# Patient Record
Sex: Female | Born: 1935 | Race: Black or African American | Hispanic: No | Marital: Married | Smoking: Former smoker
Health system: Southern US, Community
[De-identification: ages and names within clinical notes are randomized; demographics above are authoritative.]

## PROBLEM LIST (undated history)

## (undated) DIAGNOSIS — I10 Essential (primary) hypertension: Secondary | ICD-10-CM

## (undated) DIAGNOSIS — C8584 Other specified types of non-Hodgkin lymphoma, lymph nodes of axilla and upper limb: Secondary | ICD-10-CM

## (undated) DIAGNOSIS — K562 Volvulus: Secondary | ICD-10-CM

## (undated) DIAGNOSIS — C801 Malignant (primary) neoplasm, unspecified: Secondary | ICD-10-CM

## (undated) DIAGNOSIS — K219 Gastro-esophageal reflux disease without esophagitis: Secondary | ICD-10-CM

## (undated) DIAGNOSIS — Z8639 Personal history of other endocrine, nutritional and metabolic disease: Secondary | ICD-10-CM

## (undated) DIAGNOSIS — I639 Cerebral infarction, unspecified: Secondary | ICD-10-CM

## (undated) DIAGNOSIS — F410 Panic disorder [episodic paroxysmal anxiety] without agoraphobia: Secondary | ICD-10-CM

## (undated) DIAGNOSIS — E119 Type 2 diabetes mellitus without complications: Secondary | ICD-10-CM

## (undated) DIAGNOSIS — J189 Pneumonia, unspecified organism: Secondary | ICD-10-CM

## (undated) DIAGNOSIS — C9110 Chronic lymphocytic leukemia of B-cell type not having achieved remission: Secondary | ICD-10-CM

## (undated) DIAGNOSIS — I509 Heart failure, unspecified: Secondary | ICD-10-CM

## (undated) DIAGNOSIS — E785 Hyperlipidemia, unspecified: Secondary | ICD-10-CM

## (undated) DIAGNOSIS — M1990 Unspecified osteoarthritis, unspecified site: Secondary | ICD-10-CM

## (undated) DIAGNOSIS — M51369 Other intervertebral disc degeneration, lumbar region without mention of lumbar back pain or lower extremity pain: Secondary | ICD-10-CM

## (undated) DIAGNOSIS — M5136 Other intervertebral disc degeneration, lumbar region: Secondary | ICD-10-CM

## (undated) DIAGNOSIS — C8304 Small cell B-cell lymphoma, lymph nodes of axilla and upper limb: Secondary | ICD-10-CM

## (undated) HISTORY — PX: CHOLECYSTECTOMY: SHX55

## (undated) HISTORY — DX: Small cell b-cell lymphoma, lymph nodes of axilla and upper limb: C83.04

## (undated) HISTORY — PX: ABDOMINAL HYSTERECTOMY: SHX81

## (undated) HISTORY — DX: Personal history of other endocrine, nutritional and metabolic disease: Z86.39

## (undated) HISTORY — DX: Chronic lymphocytic leukemia of B-cell type not having achieved remission: C91.10

## (undated) HISTORY — DX: Other specified types of non-hodgkin lymphoma, lymph nodes of axilla and upper limb: C85.84

---

## 2007-12-24 ENCOUNTER — Emergency Department (HOSPITAL_COMMUNITY): Admission: EM | Admit: 2007-12-24 | Discharge: 2007-12-24 | Payer: Self-pay | Admitting: Emergency Medicine

## 2009-03-07 ENCOUNTER — Ambulatory Visit: Payer: Self-pay | Admitting: Internal Medicine

## 2009-03-07 ENCOUNTER — Inpatient Hospital Stay (HOSPITAL_COMMUNITY): Admission: EM | Admit: 2009-03-07 | Discharge: 2009-03-10 | Payer: Self-pay | Admitting: Emergency Medicine

## 2009-03-09 ENCOUNTER — Encounter (INDEPENDENT_AMBULATORY_CARE_PROVIDER_SITE_OTHER): Payer: Self-pay | Admitting: Surgery

## 2011-02-10 LAB — GLUCOSE, CAPILLARY
Glucose-Capillary: 104 mg/dL — ABNORMAL HIGH (ref 70–99)
Glucose-Capillary: 105 mg/dL — ABNORMAL HIGH (ref 70–99)
Glucose-Capillary: 106 mg/dL — ABNORMAL HIGH (ref 70–99)
Glucose-Capillary: 107 mg/dL — ABNORMAL HIGH (ref 70–99)
Glucose-Capillary: 109 mg/dL — ABNORMAL HIGH (ref 70–99)
Glucose-Capillary: 112 mg/dL — ABNORMAL HIGH (ref 70–99)
Glucose-Capillary: 52 mg/dL — ABNORMAL LOW (ref 70–99)
Glucose-Capillary: 75 mg/dL (ref 70–99)
Glucose-Capillary: 80 mg/dL (ref 70–99)
Glucose-Capillary: 87 mg/dL (ref 70–99)
Glucose-Capillary: 87 mg/dL (ref 70–99)
Glucose-Capillary: 87 mg/dL (ref 70–99)
Glucose-Capillary: 95 mg/dL (ref 70–99)
Glucose-Capillary: 95 mg/dL (ref 70–99)
Glucose-Capillary: 96 mg/dL (ref 70–99)
Glucose-Capillary: 98 mg/dL (ref 70–99)

## 2011-02-10 LAB — CBC
HCT: 36.2 % (ref 36.0–46.0)
Hemoglobin: 12.3 g/dL (ref 12.0–15.0)
MCHC: 34 g/dL (ref 30.0–36.0)
MCV: 85.2 fL (ref 78.0–100.0)
Platelets: 186 10*3/uL (ref 150–400)
Platelets: 192 10*3/uL (ref 150–400)
RBC: 4.24 MIL/uL (ref 3.87–5.11)
RDW: 13.8 % (ref 11.5–15.5)
WBC: 6.3 10*3/uL (ref 4.0–10.5)
WBC: 8.7 10*3/uL (ref 4.0–10.5)

## 2011-02-10 LAB — COMPREHENSIVE METABOLIC PANEL
ALT: 45 U/L — ABNORMAL HIGH (ref 0–35)
AST: 115 U/L — ABNORMAL HIGH (ref 0–37)
AST: 60 U/L — ABNORMAL HIGH (ref 0–37)
Albumin: 3.7 g/dL (ref 3.5–5.2)
Albumin: 3.9 g/dL (ref 3.5–5.2)
Alkaline Phosphatase: 83 U/L (ref 39–117)
Alkaline Phosphatase: 94 U/L (ref 39–117)
Alkaline Phosphatase: 98 U/L (ref 39–117)
BUN: 11 mg/dL (ref 6–23)
BUN: 7 mg/dL (ref 6–23)
CO2: 23 mEq/L (ref 19–32)
CO2: 25 mEq/L (ref 19–32)
Calcium: 10.2 mg/dL (ref 8.4–10.5)
Chloride: 105 mEq/L (ref 96–112)
Chloride: 109 mEq/L (ref 96–112)
Creatinine, Ser: 1.44 mg/dL — ABNORMAL HIGH (ref 0.4–1.2)
GFR calc Af Amer: 43 mL/min — ABNORMAL LOW (ref >60)
GFR calc Af Amer: 54 mL/min — ABNORMAL LOW (ref >60)
GFR calc non Af Amer: 31 mL/min — ABNORMAL LOW (ref >60)
GFR calc non Af Amer: 35 mL/min — ABNORMAL LOW (ref >60)
Glucose, Bld: 118 mg/dL — ABNORMAL HIGH (ref 70–99)
Glucose, Bld: 98 mg/dL (ref 70–99)
Potassium: 3 mEq/L — ABNORMAL LOW (ref 3.5–5.1)
Potassium: 4.3 mEq/L (ref 3.5–5.1)
Potassium: 4.9 mEq/L (ref 3.5–5.1)
Sodium: 137 mEq/L (ref 135–145)
Sodium: 141 mEq/L (ref 135–145)
Total Bilirubin: 1.2 mg/dL (ref 0.3–1.2)
Total Bilirubin: 1.7 mg/dL — ABNORMAL HIGH (ref 0.3–1.2)
Total Bilirubin: 2.1 mg/dL — ABNORMAL HIGH (ref 0.3–1.2)
Total Protein: 6 g/dL (ref 6.0–8.3)
Total Protein: 7.8 g/dL (ref 6.0–8.3)

## 2011-02-10 LAB — BASIC METABOLIC PANEL
BUN: 8 mg/dL (ref 6–23)
CO2: 21 mEq/L (ref 19–32)
Calcium: 9.8 mg/dL (ref 8.4–10.5)
Chloride: 107 mEq/L (ref 96–112)
Creatinine, Ser: 1.36 mg/dL — ABNORMAL HIGH (ref 0.4–1.2)
GFR calc Af Amer: 46 mL/min — ABNORMAL LOW (ref >60)
GFR calc non Af Amer: 38 mL/min — ABNORMAL LOW (ref >60)
Glucose, Bld: 114 mg/dL — ABNORMAL HIGH (ref 70–99)
Potassium: 4.1 mEq/L (ref 3.5–5.1)
Sodium: 136 mEq/L (ref 135–145)

## 2011-02-10 LAB — URINALYSIS, ROUTINE W REFLEX MICROSCOPIC
Bilirubin Urine: NEGATIVE
Hgb urine dipstick: NEGATIVE
Specific Gravity, Urine: 1.009 (ref 1.005–1.030)
Urobilinogen, UA: 0.2 mg/dL (ref 0.0–1.0)
pH: 8 (ref 5.0–8.0)

## 2011-02-10 LAB — LIPID PANEL
Cholesterol: 154 mg/dL (ref 0–200)
LDL Cholesterol: 111 mg/dL — ABNORMAL HIGH (ref 0–99)
Total CHOL/HDL Ratio: 5.7 RATIO

## 2011-02-10 LAB — URINE CULTURE

## 2011-02-10 LAB — DIFFERENTIAL
Basophils Relative: 0 % (ref 0–1)
Eosinophils Absolute: 0 10*3/uL (ref 0.0–0.7)
Monocytes Absolute: 0.1 10*3/uL (ref 0.1–1.0)
Neutro Abs: 8.1 10*3/uL — ABNORMAL HIGH (ref 1.7–7.7)

## 2011-03-17 NOTE — Consult Note (Signed)
NAMESESILIA, POUCHER                 ACCOUNT NO.:  [id number]   MEDICAL RECORD NO.:  [id number]          PATIENT TYPE:  INP   LOCATION:  5118                         FACILITY:  [facility]   PHYSICIAN:  Pricilla Riffle, MD, FACCDATE OF BIRTH:  11-04-1929   DATE OF CONSULTATION:  03/07/2009  DATE OF DISCHARGE:                                 CONSULTATION   IDENTIFICATION:  The patient is a 77 year old, we were asked to see  regarding preop risk stratification.   HISTORY OF PRESENT ILLNESS:  The patient was admitted on Mar 07, 2009,  with complaints of right upper quadrant pain and a low-grade fever,  temperature of 100.8.  Ultrasound showed gallstones.  A murmur was heard  and Cardiology was called.  Plan for a cholecystectomy.   The patient is not very active because of back pain.  She does limited  house work and mainly in a chair.  Denies chest pain.  Denies PND.   ALLERGIES:  None.   MEDICATIONS:  His medications on admission include,  1. Glipizide 10 b.i.d.  2. Lisinopril 40.  3. Verapamil SR 240.  4. Maxzide 37.5/25.  5. Ecotrin 325.  6. Ativan 1 t.i.d.   PAST MEDICAL HISTORY:  1. Diabetes type 2, x13 years.  The patient reports glucose ranging      from the 70s to 120s.  2. Hypertension x13 years.  3. Anxiety.  4. Degenerative joint disease.  5. History of cerebrovascular accident in 1997.  6. Question, increased lipids, on Lipitor in past.   SOCIAL HISTORY:  The patient quit tobacco in 1997 after smoking many  years, does not drink.   FAMILY HISTORY:  Significant for CAD.   REVIEW OF SYSTEMS:  Slight stroke in 1997, being followed by Dr.  Ronne Binning.  Diabetes as noted.  Sinus drainage.  DJD.  Otherwise all  systems reviewed, negative for all problems except as noted above.   PHYSICAL EXAMINATION:  GENERAL:  This patient is an obese 77 year old,  in no acute distress, lying in bed curled up.  VITAL SIGNS:  Blood pressure is 111-130/50-58, pulses 60-80 sinus  rhythm,  temperature is 98, O2 sat on room air is 94%.  HEENT:  Normocephalic, atraumatic.  EOMI.  PERRL.  Mucous membranes are  dry.  NECK:  JVP is normal.  No audible bruits.  No thyromegaly.  LUNGS:  Relatively clear.  No rales or wheezes.  CARDIAC:  Regular rate and rhythm, S1 and S2.  Grade 1/6 systolic  murmur.  No S3.  ABDOMEN:  Supple, nontender.  Normal bowel sounds.  Obese.  No masses.  EXTREMITIES:  Good distal pulses 2+ posterior tibial with no lower  extremity edema.   LABORATORY DATA:  Labs significant for hemoglobin of 14.9, WBC of 8.7,  potassium of 3, BUN and creatinine of 5 and 1.2, AST of 115, ALT of 68.  Abdominal ultrasound, aorta measures 1.7 cm, cholelithiasis noted.  Chest x-ray, no acute disease.  A 12-lead EKG, normal sinus rhythm 64  beats per minute left axis deviation.   IMPRESSION:  A 77 year old with multiple  cardiac risk factors (diabetes,  hypertension, obesity, reported CVA, tobacco use history, and question  of increased lipids).  She also has a family history of CAD.   PLAN:  1. Plan for cholecystectomy.  Activity is very limited, though because      of DJD, does house work from a chair.  Given all the above would be      an adenosine Myoview to evaluate for ischemia.  2. I am not impressed by significant murmur, but would get      echocardiogram to confirm evaluate LV function.  3. Health care maintenance.  We will check a lipid panel, not clear,      however, she is not on Lipitor.  4. Diabetes per primary team.      Pricilla Riffle, MD, [facility]  Electronically Signed     PVR/MEDQ  D:  03/07/2009  T:  03/08/2009  Job:  [id number]

## 2011-03-17 NOTE — H&P (Signed)
[name], [name]                 ACCOUNT NO.:  [id number]   MEDICAL RECORD NO.:  [id number]          PATIENT TYPE:  INP   LOCATION:  5118                         FACILITY:  [facility]   PHYSICIAN:  Erika [name], M.D.DATE OF BIRTH:  1929/12/31   DATE OF ADMISSION:  03/07/2009  DATE OF DISCHARGE:                              HISTORY & PHYSICAL   CHIEF COMPLAINT:  Right upper quadrant pain, nausea, and vomiting.   HISTORY OF PRESENT ILLNESS:  The patient is a 77 year old female with a  1-day history of right upper quadrant pain, nausea, and vomiting.  The  pain was severe in nature and 8/10.  Located in the right upper quadrant  epigastrium.  It is improved somewhat with pain medicine here in the  emergency room.  It has also been associated with nausea, vomiting, and  weakness.  She has never had pain like this before.  There is no  definite association with eating.  She had a low-grade temperature of  100.8 when she came to the emergency room.  I was just associated by Dr.  Virginia [name] in the emergency room.  Ultrasound showed gallstones without signs  of cholecystitis.   PAST MEDICAL HISTORY:  1. Type 2 diabetes mellitus.  2. Hypertension.  3. Anxiety.  4. Cerebrovascular accident.   PAST SURGICAL HISTORY:  Denies.   ALLERGIES:  None.   MEDICATIONS:  1. Glipizide 10 mg b.i.d.  2. Lisinopril 40 mg once a day.  3. Verapamil SR 240 daily.  4. Maxzide 37.5/25 daily.  5. Ativan 1 mg t.i.d.  6. Ecotrin 325 mg daily.   FAMILY HISTORY:  Noncontributory.   SOCIAL HISTORY:  Denies tobacco or alcohol use.   REVIEW OF SYSTEMS:  As above, otherwise negative x15.   PHYSICAL EXAMINATION:  VITAL SIGNS:  Temperature 100.8, heart rate  anywhere from 95 to 118, and blood pressure 114/49.  GENERAL APPEARANCE:  Pleasant female, in no apparent distress.  HEENT:  No evidence of visible jaundice.  Oropharynx is moist.  NECK:  Supple and nontender.  Full range of motion.  PULMONARY:  Lungs  sounds are clear bilaterally.  Chest wall motion is  normal  CARDIOVASCULAR:  There is a 3/6 systolic ejection murmur, otherwise rate  is regular.  EXTREMITIES:  Warm and well perfused.  ABDOMEN:  Tender in the right upper quadrant.  There was some mild  rebound, guarding noted.  No hernia.  No ascites.  MUSCULOSKELETAL:  Muscle tone appears normal.  Range of motion is  normal.  NEUROLOGIC:  Glasgow Coma Scale is 15.  Motor and sensory function  grossly intact.   DIAGNOSTIC STUDIES:  Ultrasound of the abdomen shows multiple large  gallstones without significant gallbladder wall thickening.  She has a  white count of 8700, hemoglobin of 14, hematocrit of 40, and platelet  count is 186, there is a left shift though.  CMP shows a bilirubin of  2.1, alk phos is 98, and SGOT 115.  Sodium 137, potassium 3, chloride  105, glucose is 115.  Lipase is normal at 49.  Urinalysis is normal.  IMPRESSION:  1. Symptomatic cholecystitis by clinical exam.  2. History of hypertension.  3. Diabetes mellitus type 2.  4. Heart murmur.   PLAN:  She will be admitted for IV antibiotics and placement of her  potassium and will need a cardiac workup to look at the murmur prior to  any surgical intervention.  She is feeling somewhat better given her  diabetes and left shift on her CBC and her physical exam findings and we  feel cholecystectomy would be best if she tolerates that.  We will get  her admitted and ask Cardiology to see her later today.      Erika [name], M.D.  Electronically Signed     TAC/MEDQ  D:  03/07/2009  T:  03/07/2009  Job:  [id number]

## 2011-03-17 NOTE — Op Note (Signed)
Erika [name], Erika [name]                 ACCOUNT NO.:  [id number]   MEDICAL RECORD NO.:  [id number]          PATIENT TYPE:  INP   LOCATION:  5118                         FACILITY:  [facility]   PHYSICIAN:  Wilmon Arms. Corliss Skains, M.D. DATE OF BIRTH:  08/29/1930   DATE OF PROCEDURE:  03/09/2009  DATE OF DISCHARGE:                               OPERATIVE REPORT   PREOPERATIVE DIAGNOSIS:  Acute cholecystitis.   POSTOPERATIVE DIAGNOSIS:  Acute cholecystitis.   PROCEDURE PERFORMED:  Laparoscopic cholecystectomy with intraoperative  cholangiogram.   SURGEON:  Wilmon Arms. Corliss Skains, MD   ASSISTANT:  Anselm Pancoast. Zachery Dakins, M.D..   ANESTHESIA:  General endotracheal.   INDICATIONS:  This is a 77 year old female who presented with a 3-day  history of right upper quadrant pain, nausea, vomiting.  She was worked  up in the emergency department and was evaluated with an ultrasound  showing gallstones, but no wall thickening.  The patient continues to  have symptoms.  She underwent a cardiac evaluation by Cardiology and  this shows that the patient has no significant cardiac disease.  She  presents now for cholecystectomy.   DESCRIPTION OF PROCEDURE:  The patient was brought to the operating  room, placed in the supine position on operating room table.  After an  adequate level of general anesthesia was obtained, the patient's abdomen  was prepped with Betadine and draped in sterile fashion.  A time-out was  taken to assure proper patient, proper procedure.  The area below her  umbilicus was infiltrated with 0.25% Marcaine with epinephrine.  A  transverse incision was made.  Dissection was carried down through  subcutaneous tissues with blunt dissection.  The fascia was grasped with  Kocher clamps and was divided vertically.  The peritoneum was bluntly  entered.  A stay suture of 0 Vicryl was placed around the fascial  opening.  The Hasson cannula was inserted and secured to stay suture.  Pneumoperitoneum was  obtained by insufflating CO2, maintaining maximal  pressure of 15 mmHg.  The laparoscope was inserted and the patient was  positioned in reverse Trendelenburg tilted to her left.  A very large  distended gallbladder was identified.  There were omental adhesions to  the surface of the gallbladder.  An 11-mm port was placed in the  subxiphoid position.  Two 5-mm ports were placed in the right upper  quadrant.  The gallbladder was grasped with the clamp and elevated.  Cautery was used to dissect the omentum away from the surface of the  gallbladder.  The gallbladder was mildly thickened and we retracted it  superiorly.  We were able to dissect all the adhesions away and expose  the hilum of the gallbladder.  The peritoneum was opened and we bluntly  dissected around the cystic duct and cystic artery.  The cystic duct was  ligated and clipped distally.  A small opening was created on a cystic  duct.  A catheter was inserted through a stab incision and threaded into  the cystic duct.  A cholangiogram was obtained, which showed good flow  proximally and distally to biliary  tree with no sign of filling defect  or obstruction.  Contrast flowed readily into the duodenum.  The  catheter was removed and the cystic duct was ligated with clips and  divided.  The cystic artery was ligated with clips and divided.  Cautery  was then used to remove the gallbladder from the liver bed.  The  gallbladder was placed in an EndoCatch sac.  Instruments were removed  from the umbilical port site.  We reinspected the gallbladder fossa and  hemostasis was good.  We irrigated the right upper quadrant.  Pneumoperitoneum was then released as to remove the trocars.  The  pursestring suture  was used to close the fascia.  A 4-0 Monocryl was used to close the skin  incisions.  Steri-Strips and clean dressing was applied.  The patient  was then extubated and brought to recovery room in stable condition.  All sponge,  instrument, needle counts were correct.      Wilmon Arms. Tsuei, M.D.  Electronically Signed     MKT/MEDQ  D:  03/09/2009  T:  03/10/2009  Job:  [id number]

## 2011-03-20 NOTE — Discharge Summary (Signed)
Erika Day, Erika Day                 ACCOUNT NO.:  [id number]   MEDICAL RECORD NO.:  [id number]          PATIENT TYPE:  INP   LOCATION:  5118                         FACILITY:  [facility]   PHYSICIAN:  Anselm Pancoast. Weatherly, M.D.DATE OF BIRTH:  [birth year]   DATE OF ADMISSION:  03/07/2009  DATE OF DISCHARGE:  03/10/2009                               DISCHARGE SUMMARY   DISCHARGING PHYSICIAN:  Anselm Pancoast. Zachery Dakins, M.D.   CHIEF COMPLAINT AND REASON FOR ADMISSION:  Erika Day is a 77 year old  female who presented to the hospital with 1 day of right upper quadrant  abdominal pain associated with nausea and vomiting, severe 8/10, has not  had symptoms like this before.  When she presented to the ER, she was  found have a low-grade temperature of 100.8.  An ultrasound was done  that demonstrated gallstones without evidence of cholecystitis.  On  exam, her abdomen was tender in the right upper quadrant.   The patient was admitted with the following diagnoses:  1. Symptomatic cholelithiasis without evidence of cholecystitis.  2. Transaminitis with a total bilirubin of 2.1, possible      choledocholithiasis.  3. Hypertension.  4. Diabetes.  5. Heart murmur.   HOSPITAL COURSE:  The patient was admitted, placed on n.p.o. status, DVT  prophylaxis was initiated, follow-up labs were obtained.  Due to the  patient's advanced age and multiple risk factors, cardiology consult was  obtained to clear the patient for surgery.  She was evaluated by Dr.  Tenny Craw.  The patient was otherwise deemed appropriate to proceed with  surgical intervention.  Follow-up LFTs did demonstrate that the total  bilirubin was decreasing, so it was felt that the patient could safely  proceed with surgery.  By date of surgery, total bilirubin had actually  decreased down to 1.2.   On Mar 09, 2009, the patient was taken to the OR by Dr. Corliss Skains where she  underwent a laparoscopic cholecystectomy with a normal intraoperative  cholangiogram for acute cholecystitis.  It was discovered she had a  large inflamed distended gallbladder with stones.  She was stable from a  postoperative standpoint, had no issues related to potential cardiac  issues and cardiology signed off.  By postop day #1, the patient was  afebrile, vital signs were stable.  She was satting 92% on room air.  She was tolerating a diet.  There was some concern that she was having  some mild nausea.  It was felt that it might be related to pain  medications, so the medicines were adjusted and the patient's nausea  resolved.  Her abdomen was soft.  Bowel sounds were present and her  dressings were clean, dry, and intact.  When she presented, her  urinalysis was abnormal and subsequent culture revealed greater than  100,000 colonies of diphtheroids which are pansensitive.  She had been  placed on empiric Cipro at date of admission and this had been continued  throughout the hospitalization.  Since she had received 3 days of IV  antibiotics for an uncomplicated community-acquired urinary tract  infection, it was determined she  did not require any additional  antibiotic therapy, and therefore she was otherwise deemed appropriate  for discharge home on postop day #1.   FINAL DISCHARGE DIAGNOSES:  1. Abdominal pain secondary to acute cholecystitis with      cholelithiasis.  2. Diabetes mellitus, well controlled this hospital admission.  3. Hypertension and history of cerebrovascular accident, stable.  4. Transient hypokalemia.   DISCHARGE MEDICATIONS:  The patient will resume the following home  medications:  1. Verapamil SR 240 mg daily.  2. Glipizide 10 mg b.i.d.  3. Lisinopril 40 mg daily.  4. Aspirin 325 mg daily.  5. Lorazepam 1 mg t.i.d.  6. Triamterene/hydrochlorothiazide 37.5/25 daily.   New medication includes Percocet 5/325 one to two tablets every 4 hours  as needed for pain.   ADDITIONAL INSTRUCTIONS:  1. Diet, no restrictions.   Continue carb-modified diet as previous.  2. Remove bandages in the morning of Mar 11, 2009.   ACTIVITY:  Increase activity slowly.  May walk up steps.  May shower.  No lifting greater than 15 pounds for 2 weeks.  No driving for 1 week.   FOLLOW-UP APPOINTMENTS:  At the doc of the week clinic at [facility] on Mar 19, 2009, at 3:30 p.m.   Additional instructions as follows:  Call surgeon if,  A.  Fever greater than or equal to 101.5 degrees Fahrenheit.  B.  New or increased belly pain.  C.  Redness or drainage from wounds.  D.  Nausea, vomiting, diarrhea, or constipation.      Allison L. Rennis Harding, N.P.      Anselm Pancoast. Zachery Dakins, M.D.  Electronically Signed    ALE/MEDQ  D:  04/30/2009  T:  05/01/2009  Job:  [id number]   cc:   Wilmon Arms. Tsuei, M.D.

## 2011-07-27 LAB — POCT CARDIAC MARKERS
Myoglobin, poc: 216
Operator id: 257131
Troponin i, poc: <0.05

## 2011-07-27 LAB — URINE MICROSCOPIC-ADD ON

## 2011-07-27 LAB — I-STAT 8, (EC8 V) (CONVERTED LAB)
BUN: 9
Bicarbonate: 20
Glucose, Bld: 136 — ABNORMAL HIGH
Hemoglobin: 14.3
Sodium: 134 — ABNORMAL LOW
pH, Ven: 7.497 — ABNORMAL HIGH

## 2011-07-27 LAB — URINALYSIS, ROUTINE W REFLEX MICROSCOPIC
Hgb urine dipstick: NEGATIVE
Nitrite: NEGATIVE
Specific Gravity, Urine: 1.013
Urobilinogen, UA: 1
pH: 8

## 2011-07-27 LAB — DIFFERENTIAL
Basophils Absolute: 0
Eosinophils Relative: 0
Lymphocytes Relative: 6 — ABNORMAL LOW
Lymphs Abs: 0.6 — ABNORMAL LOW
Neutro Abs: 9.9 — ABNORMAL HIGH
Neutrophils Relative %: 90 — ABNORMAL HIGH

## 2011-07-27 LAB — CBC
Platelets: 219
RDW: 13.8
WBC: 11 — ABNORMAL HIGH

## 2011-08-17 ENCOUNTER — Inpatient Hospital Stay (HOSPITAL_COMMUNITY)
Admission: EM | Admit: 2011-08-17 | Discharge: 2011-08-21 | DRG: 871 | Disposition: A | Discharge status: Other Acute Inpatient Hospital | Payer: PRIVATE HEALTH INSURANCE | Attending: Pulmonary Disease | Admitting: Pulmonary Disease

## 2011-08-17 ENCOUNTER — Emergency Department (HOSPITAL_COMMUNITY): Payer: PRIVATE HEALTH INSURANCE

## 2011-08-17 DIAGNOSIS — F411 Generalized anxiety disorder: Secondary | ICD-10-CM | POA: Diagnosis present

## 2011-08-17 DIAGNOSIS — N179 Acute kidney failure, unspecified: Secondary | ICD-10-CM | POA: Diagnosis not present

## 2011-08-17 DIAGNOSIS — R109 Unspecified abdominal pain: Secondary | ICD-10-CM | POA: Diagnosis present

## 2011-08-17 DIAGNOSIS — I10 Essential (primary) hypertension: Secondary | ICD-10-CM | POA: Diagnosis present

## 2011-08-17 DIAGNOSIS — Z87891 Personal history of nicotine dependence: Secondary | ICD-10-CM

## 2011-08-17 DIAGNOSIS — G9349 Other encephalopathy: Secondary | ICD-10-CM | POA: Diagnosis present

## 2011-08-17 DIAGNOSIS — Z8673 Personal history of transient ischemic attack (TIA), and cerebral infarction without residual deficits: Secondary | ICD-10-CM

## 2011-08-17 DIAGNOSIS — D72829 Elevated white blood cell count, unspecified: Secondary | ICD-10-CM | POA: Diagnosis present

## 2011-08-17 DIAGNOSIS — E876 Hypokalemia: Secondary | ICD-10-CM | POA: Diagnosis present

## 2011-08-17 DIAGNOSIS — C569 Malignant neoplasm of unspecified ovary: Secondary | ICD-10-CM | POA: Diagnosis present

## 2011-08-17 DIAGNOSIS — R112 Nausea with vomiting, unspecified: Secondary | ICD-10-CM | POA: Diagnosis present

## 2011-08-17 DIAGNOSIS — D6489 Other specified anemias: Secondary | ICD-10-CM | POA: Diagnosis present

## 2011-08-17 DIAGNOSIS — E785 Hyperlipidemia, unspecified: Secondary | ICD-10-CM | POA: Diagnosis present

## 2011-08-17 DIAGNOSIS — M1990 Unspecified osteoarthritis, unspecified site: Secondary | ICD-10-CM | POA: Diagnosis present

## 2011-08-17 DIAGNOSIS — E1169 Type 2 diabetes mellitus with other specified complication: Secondary | ICD-10-CM | POA: Diagnosis present

## 2011-08-17 DIAGNOSIS — A419 Sepsis, unspecified organism: Principal | ICD-10-CM | POA: Diagnosis present

## 2011-08-17 DIAGNOSIS — K5669 Other intestinal obstruction: Secondary | ICD-10-CM | POA: Diagnosis present

## 2011-08-17 LAB — DIFFERENTIAL
Lymphocytes Relative: 20 % (ref 12–46)
Monocytes Absolute: 0.7 10*3/uL (ref 0.1–1.0)
Monocytes Relative: 5 % (ref 3–12)
Neutro Abs: 10.4 10*3/uL — ABNORMAL HIGH (ref 1.7–7.7)

## 2011-08-17 LAB — GLUCOSE, CAPILLARY
Glucose-Capillary: 122 mg/dL — ABNORMAL HIGH (ref 70–99)
Glucose-Capillary: 39 mg/dL — CL (ref 70–99)
Glucose-Capillary: 32 mg/dL — CL (ref 70–99)
Glucose-Capillary: 81 mg/dL (ref 70–99)

## 2011-08-17 LAB — COMPREHENSIVE METABOLIC PANEL
AST: 25 U/L (ref 0–37)
CO2: 24 mEq/L (ref 19–32)
Chloride: 96 mEq/L (ref 96–112)
Creatinine, Ser: 1.08 mg/dL (ref 0.50–1.10)
GFR calc non Af Amer: 48 mL/min — ABNORMAL LOW (ref >90)
Total Bilirubin: 0.4 mg/dL (ref 0.3–1.2)

## 2011-08-17 LAB — CBC
HCT: 33.2 % — ABNORMAL LOW (ref 36.0–46.0)
MCH: 26.7 pg (ref 26.0–34.0)
MCHC: 32.8 g/dL (ref 30.0–36.0)
MCHC: 32.8 g/dL (ref 30.0–36.0)
MCV: 81.2 fL (ref 78.0–100.0)
Platelets: 178 10*3/uL (ref 150–400)
Platelets: 197 10*3/uL (ref 150–400)
RDW: 14.1 % (ref 11.5–15.5)
RDW: 14.1 % (ref 11.5–15.5)
RDW: 14.1 % (ref 11.5–15.5)
WBC: 12.6 10*3/uL — ABNORMAL HIGH (ref 4.0–10.5)
WBC: 14 10*3/uL — ABNORMAL HIGH (ref 4.0–10.5)

## 2011-08-17 LAB — URINALYSIS, ROUTINE W REFLEX MICROSCOPIC
Glucose, UA: NEGATIVE mg/dL
Hgb urine dipstick: NEGATIVE
Ketones, ur: NEGATIVE mg/dL
Leukocytes, UA: NEGATIVE
Protein, ur: NEGATIVE mg/dL

## 2011-08-17 MED ORDER — IOHEXOL 300 MG/ML  SOLN
100.0000 mL | Freq: Once | INTRAMUSCULAR | Status: AC | PRN
  Administered 2011-08-17: 100 mL via INTRAVENOUS

## 2011-08-18 ENCOUNTER — Emergency Department (HOSPITAL_COMMUNITY): Payer: PRIVATE HEALTH INSURANCE

## 2011-08-18 DIAGNOSIS — A419 Sepsis, unspecified organism: Secondary | ICD-10-CM

## 2011-08-18 DIAGNOSIS — N170 Acute kidney failure with tubular necrosis: Secondary | ICD-10-CM

## 2011-08-18 DIAGNOSIS — R6520 Severe sepsis without septic shock: Secondary | ICD-10-CM

## 2011-08-18 DIAGNOSIS — R6521 Severe sepsis with septic shock: Secondary | ICD-10-CM

## 2011-08-18 DIAGNOSIS — K56609 Unspecified intestinal obstruction, unspecified as to partial versus complete obstruction: Secondary | ICD-10-CM

## 2011-08-18 LAB — DIFFERENTIAL
Basophils Absolute: 0 10*3/uL (ref 0.0–0.1)
Basophils Relative: 0 % (ref 0–1)
Lymphocytes Relative: 31 % (ref 12–46)
Lymphocytes Relative: 31 % (ref 12–46)
Monocytes Absolute: 0.9 10*3/uL (ref 0.1–1.0)
Monocytes Absolute: 0.7 10*3/uL (ref 0.1–1.0)
Monocytes Relative: 7 % (ref 3–12)
Monocytes Relative: 6 % (ref 3–12)
Neutro Abs: 7.6 10*3/uL (ref 1.7–7.7)
Neutro Abs: 7.4 10*3/uL (ref 1.7–7.7)
Neutrophils Relative %: 61 % (ref 43–77)
Neutrophils Relative %: 63 % (ref 43–77)

## 2011-08-18 LAB — CBC
HCT: 32 % — ABNORMAL LOW (ref 36.0–46.0)
HCT: 24.6 % — ABNORMAL LOW (ref 36.0–46.0)
HCT: 24.6 % — ABNORMAL LOW (ref 36.0–46.0)
Hemoglobin: 10.3 g/dL — ABNORMAL LOW (ref 12.0–15.0)
Hemoglobin: 8.1 g/dL — ABNORMAL LOW (ref 12.0–15.0)
Hemoglobin: 8 g/dL — ABNORMAL LOW (ref 12.0–15.0)
MCH: 26.5 pg (ref 26.0–34.0)
MCH: 27.2 pg (ref 26.0–34.0)
MCH: 26.8 pg (ref 26.0–34.0)
MCHC: 33.2 g/dL (ref 30.0–36.0)
MCHC: 32.2 g/dL (ref 30.0–36.0)
MCHC: 32.9 g/dL (ref 30.0–36.0)
MCHC: 32.5 g/dL (ref 30.0–36.0)
MCV: 81.6 fL (ref 78.0–100.0)
MCV: 82.3 fL (ref 78.0–100.0)
Platelets: 219 10*3/uL (ref 150–400)
Platelets: 243 10*3/uL (ref 150–400)
RBC: 3.89 MIL/uL (ref 3.87–5.11)
RBC: 2.98 MIL/uL — ABNORMAL LOW (ref 3.87–5.11)
RBC: 2.99 MIL/uL — ABNORMAL LOW (ref 3.87–5.11)
RDW: 14.2 % (ref 11.5–15.5)
RDW: 14.3 % (ref 11.5–15.5)
WBC: 16.8 10*3/uL — ABNORMAL HIGH (ref 4.0–10.5)
WBC: 18 10*3/uL — ABNORMAL HIGH (ref 4.0–10.5)

## 2011-08-18 LAB — PROTIME-INR
INR: 1.47 (ref 0.00–1.49)
Prothrombin Time: 18.1 seconds — ABNORMAL HIGH (ref 11.6–15.2)

## 2011-08-18 LAB — URINE CULTURE: Culture  Setup Time: 201210160204

## 2011-08-18 LAB — GLUCOSE, CAPILLARY
Glucose-Capillary: 32 mg/dL — CL (ref 70–99)
Glucose-Capillary: 83 mg/dL (ref 70–99)
Glucose-Capillary: 91 mg/dL (ref 70–99)
Glucose-Capillary: 98 mg/dL (ref 70–99)

## 2011-08-18 LAB — TROPONIN I: Troponin I: <0.3 ng/mL (ref <0.30)

## 2011-08-18 LAB — CK TOTAL AND CKMB (NOT AT ARMC)
Relative Index: 0.9 (ref 0.0–2.5)
Total CK: 551 U/L — ABNORMAL HIGH (ref 7–177)

## 2011-08-18 LAB — CARDIAC PANEL(CRET KIN+CKTOT+MB+TROPI)
Relative Index: 0.9 (ref 0.0–2.5)
Total CK: 595 U/L — ABNORMAL HIGH (ref 7–177)
Troponin I: <0.3 ng/mL (ref <0.30)

## 2011-08-18 LAB — COMPREHENSIVE METABOLIC PANEL
ALT: 9 U/L (ref 0–35)
AST: 27 U/L (ref 0–37)
Albumin: 2 g/dL — ABNORMAL LOW (ref 3.5–5.2)
Albumin: 2 g/dL — ABNORMAL LOW (ref 3.5–5.2)
BUN: 14 mg/dL (ref 6–23)
CO2: 21 mEq/L (ref 19–32)
Calcium: 7.6 mg/dL — ABNORMAL LOW (ref 8.4–10.5)
Calcium: 7.4 mg/dL — ABNORMAL LOW (ref 8.4–10.5)
Creatinine, Ser: 1.95 mg/dL — ABNORMAL HIGH (ref 0.50–1.10)
GFR calc Af Amer: 31 mL/min — ABNORMAL LOW (ref >90)
GFR calc non Af Amer: 23 mL/min — ABNORMAL LOW (ref >90)
Glucose, Bld: 111 mg/dL — ABNORMAL HIGH (ref 70–99)
Sodium: 129 mEq/L — ABNORMAL LOW (ref 135–145)
Sodium: 131 mEq/L — ABNORMAL LOW (ref 135–145)
Total Protein: 5.3 g/dL — ABNORMAL LOW (ref 6.0–8.3)

## 2011-08-18 LAB — BLOOD GAS, ARTERIAL
Acid-base deficit: 1.9 mmol/L (ref 0.0–2.0)
Acid-base deficit: 2.4 mmol/L — ABNORMAL HIGH (ref 0.0–2.0)
Bicarbonate: 22.6 mEq/L (ref 20.0–24.0)
TCO2: 20.7 mmol/L (ref 0–100)
TCO2: 21.9 mmol/L (ref 0–100)
pCO2 arterial: 38.5 mmHg (ref 35.0–45.0)
pCO2 arterial: 43.4 mmHg (ref 35.0–45.0)
pH, Arterial: 7.337 — ABNORMAL LOW (ref 7.350–7.400)
pO2, Arterial: 76.7 mmHg — ABNORMAL LOW (ref 80.0–100.0)
pO2, Arterial: 27.7 mmHg — CL (ref 80.0–100.0)

## 2011-08-18 LAB — BASIC METABOLIC PANEL
BUN: 13 mg/dL (ref 6–23)
CO2: 21 mEq/L (ref 19–32)
Glucose, Bld: 119 mg/dL — ABNORMAL HIGH (ref 70–99)
Potassium: 3.6 mEq/L (ref 3.5–5.1)
Sodium: 132 mEq/L — ABNORMAL LOW (ref 135–145)

## 2011-08-18 LAB — CK: Total CK: 558 U/L — ABNORMAL HIGH (ref 7–177)

## 2011-08-18 LAB — URINALYSIS, MICROSCOPIC ONLY
Glucose, UA: NEGATIVE mg/dL
Ketones, ur: NEGATIVE mg/dL
Nitrite: NEGATIVE
pH: 6 (ref 5.0–8.0)

## 2011-08-18 LAB — LIPASE, BLOOD
Lipase: 11 U/L (ref 11–59)
Lipase: 11 U/L (ref 11–59)

## 2011-08-18 LAB — CARBOXYHEMOGLOBIN
Carboxyhemoglobin: 1.2 % (ref 0.5–1.5)
Methemoglobin: 0.6 % (ref 0.0–1.5)
Methemoglobin: 1.3 % (ref 0.0–1.5)
O2 Saturation: 60.4 %
Total hemoglobin: 10.4 g/dL — ABNORMAL LOW (ref 12.5–16.0)
Total hemoglobin: 8.5 g/dL — ABNORMAL LOW (ref 12.5–16.0)

## 2011-08-18 LAB — D-DIMER, QUANTITATIVE
D-Dimer, Quant: 1.89 ug/mL-FEU — ABNORMAL HIGH (ref 0.00–0.48)
D-Dimer, Quant: 7.97 ug/mL-FEU — ABNORMAL HIGH (ref 0.00–0.48)

## 2011-08-18 LAB — LACTIC ACID, PLASMA: Lactic Acid, Venous: 1.1 mmol/L (ref 0.5–2.2)

## 2011-08-18 LAB — APTT
aPTT: 40 seconds — ABNORMAL HIGH (ref 24–37)
aPTT: 41 seconds — ABNORMAL HIGH (ref 24–37)

## 2011-08-18 LAB — PREPARE RBC (CROSSMATCH)

## 2011-08-19 ENCOUNTER — Inpatient Hospital Stay (HOSPITAL_COMMUNITY): Payer: PRIVATE HEALTH INSURANCE

## 2011-08-19 LAB — GLUCOSE, CAPILLARY
Glucose-Capillary: 121 mg/dL — ABNORMAL HIGH (ref 70–99)
Glucose-Capillary: 120 mg/dL — ABNORMAL HIGH (ref 70–99)
Glucose-Capillary: 138 mg/dL — ABNORMAL HIGH (ref 70–99)
Glucose-Capillary: 153 mg/dL — ABNORMAL HIGH (ref 70–99)
Glucose-Capillary: 176 mg/dL — ABNORMAL HIGH (ref 70–99)

## 2011-08-19 LAB — BASIC METABOLIC PANEL WITH GFR
BUN: 13 mg/dL (ref 6–23)
CO2: 19 meq/L (ref 19–32)
Calcium: 7.2 mg/dL — ABNORMAL LOW (ref 8.4–10.5)
Chloride: 108 meq/L (ref 96–112)
Creatinine, Ser: 1.48 mg/dL — ABNORMAL HIGH (ref 0.50–1.10)
GFR calc Af Amer: 38 mL/min — ABNORMAL LOW
GFR calc non Af Amer: 32 mL/min — ABNORMAL LOW
Glucose, Bld: 134 mg/dL — ABNORMAL HIGH (ref 70–99)
Potassium: 3.5 meq/L (ref 3.5–5.1)
Sodium: 135 meq/L (ref 135–145)

## 2011-08-19 LAB — BLOOD GAS, ARTERIAL
FIO2: 0.21 %
Patient temperature: 98.6
TCO2: 17.6 mmol/L (ref 0–100)
pH, Arterial: 7.37 (ref 7.350–7.400)

## 2011-08-19 LAB — CARBOXYHEMOGLOBIN
Carboxyhemoglobin: 1.3 % (ref 0.5–1.5)
Carboxyhemoglobin: 1.9 % — ABNORMAL HIGH (ref 0.5–1.5)
Carboxyhemoglobin: 1.9 % — ABNORMAL HIGH (ref 0.5–1.5)
Methemoglobin: 1.5 % (ref 0.0–1.5)
Methemoglobin: 1.2 % (ref 0.0–1.5)
O2 Saturation: 59.1 %
Total hemoglobin: 8.4 g/dL — ABNORMAL LOW (ref 12.5–16.0)

## 2011-08-19 LAB — CBC
HCT: 25.2 % — ABNORMAL LOW (ref 36.0–46.0)
Hemoglobin: 8.2 g/dL — ABNORMAL LOW (ref 12.0–15.0)
MCHC: 32.5 g/dL (ref 30.0–36.0)
WBC: 10.4 10*3/uL (ref 4.0–10.5)

## 2011-08-19 LAB — PHOSPHORUS: Phosphorus: 2.1 mg/dL — ABNORMAL LOW (ref 2.3–4.6)

## 2011-08-19 LAB — PROCALCITONIN: Procalcitonin: 1.67 ng/mL

## 2011-08-19 LAB — CORTISOL: Cortisol, Plasma: 15.8 ug/dL

## 2011-08-19 LAB — MAGNESIUM: Magnesium: 1.9 mg/dL (ref 1.5–2.5)

## 2011-08-20 ENCOUNTER — Inpatient Hospital Stay (HOSPITAL_COMMUNITY): Payer: PRIVATE HEALTH INSURANCE

## 2011-08-20 DIAGNOSIS — K56609 Unspecified intestinal obstruction, unspecified as to partial versus complete obstruction: Secondary | ICD-10-CM

## 2011-08-20 DIAGNOSIS — A419 Sepsis, unspecified organism: Secondary | ICD-10-CM

## 2011-08-20 DIAGNOSIS — R6521 Severe sepsis with septic shock: Secondary | ICD-10-CM

## 2011-08-20 DIAGNOSIS — N170 Acute kidney failure with tubular necrosis: Secondary | ICD-10-CM

## 2011-08-20 DIAGNOSIS — R1909 Other intra-abdominal and pelvic swelling, mass and lump: Secondary | ICD-10-CM

## 2011-08-20 LAB — BASIC METABOLIC PANEL
BUN: 11 mg/dL (ref 6–23)
Chloride: 110 mEq/L (ref 96–112)
Creatinine, Ser: 1.15 mg/dL — ABNORMAL HIGH (ref 0.50–1.10)
GFR calc Af Amer: 51 mL/min — ABNORMAL LOW (ref >90)
GFR calc non Af Amer: 44 mL/min — ABNORMAL LOW (ref >90)
Potassium: 3.3 mEq/L — ABNORMAL LOW (ref 3.5–5.1)

## 2011-08-20 LAB — GLUCOSE, CAPILLARY
Glucose-Capillary: 168 mg/dL — ABNORMAL HIGH (ref 70–99)
Glucose-Capillary: 205 mg/dL — ABNORMAL HIGH (ref 70–99)
Glucose-Capillary: 166 mg/dL — ABNORMAL HIGH (ref 70–99)

## 2011-08-20 LAB — VANCOMYCIN, TROUGH: Vancomycin Tr: <5 ug/mL — ABNORMAL LOW (ref 10.0–20.0)

## 2011-08-20 LAB — CBC
HCT: 24.9 % — ABNORMAL LOW (ref 36.0–46.0)
MCHC: 33.3 g/dL (ref 30.0–36.0)
MCV: 81.1 fL (ref 78.0–100.0)
Platelets: 163 10*3/uL (ref 150–400)
RDW: 15.2 % (ref 11.5–15.5)
WBC: 6.7 10*3/uL (ref 4.0–10.5)

## 2011-08-21 ENCOUNTER — Inpatient Hospital Stay (HOSPITAL_COMMUNITY): Payer: PRIVATE HEALTH INSURANCE

## 2011-08-21 LAB — GLUCOSE, CAPILLARY
Glucose-Capillary: 127 mg/dL — ABNORMAL HIGH (ref 70–99)
Glucose-Capillary: 143 mg/dL — ABNORMAL HIGH (ref 70–99)

## 2011-08-21 LAB — CBC
HCT: 25.8 % — ABNORMAL LOW (ref 36.0–46.0)
Hemoglobin: 8.8 g/dL — ABNORMAL LOW (ref 12.0–15.0)
Hemoglobin: 8.6 g/dL — ABNORMAL LOW (ref 12.0–15.0)
MCH: 26.3 pg (ref 26.0–34.0)
MCHC: 34.1 g/dL (ref 30.0–36.0)
MCHC: 32.6 g/dL (ref 30.0–36.0)
MCV: 80.7 fL (ref 78.0–100.0)
RBC: 3.22 MIL/uL — ABNORMAL LOW (ref 3.87–5.11)
RBC: 3.27 MIL/uL — ABNORMAL LOW (ref 3.87–5.11)
WBC: 8.3 10*3/uL (ref 4.0–10.5)

## 2011-08-21 LAB — BASIC METABOLIC PANEL
BUN: 11 mg/dL (ref 6–23)
CO2: 20 mEq/L (ref 19–32)
Chloride: 112 mEq/L (ref 96–112)
GFR calc non Af Amer: 50 mL/min — ABNORMAL LOW (ref >90)
Glucose, Bld: 146 mg/dL — ABNORMAL HIGH (ref 70–99)
Potassium: 3.9 mEq/L (ref 3.5–5.1)
Sodium: 139 mEq/L (ref 135–145)

## 2011-08-21 LAB — DIFFERENTIAL
Basophils Relative: 0 % (ref 0–1)
Eosinophils Absolute: 0 10*3/uL (ref 0.0–0.7)
Lymphs Abs: 2.9 10*3/uL (ref 0.7–4.0)
Monocytes Absolute: 0.2 10*3/uL (ref 0.1–1.0)
Monocytes Relative: 3 % (ref 3–12)
Neutro Abs: 5.6 10*3/uL (ref 1.7–7.7)
Neutrophils Relative %: 65 % (ref 43–77)

## 2011-08-22 LAB — TYPE AND SCREEN
ABO/RH(D): A POS
Unit division: 0

## 2011-08-24 LAB — CULTURE, BLOOD (ROUTINE X 2): Culture  Setup Time: 201210162332

## 2011-08-24 LAB — GLUCOSE, CAPILLARY: Glucose-Capillary: 129 mg/dL — ABNORMAL HIGH (ref 70–99)

## 2011-08-24 NOTE — Consult Note (Signed)
  Erika Day, Erika Day                 ACCOUNT NO.:  [id number]  MEDICAL RECORD NO.:  [id number]  LOCATION:  WLED                         FACILITY:  [facility]  PHYSICIAN:  Guy Sandifer. Henderson Cloud, M.D. DATE OF BIRTH:  06-Apr-1930  DATE OF CONSULTATION:  08/17/2011 DATE OF DISCHARGE:                                CONSULTATION   REASON FOR CONSULTATION:  This patient is a 77 year old, black female, with an unknown last menstrual period, G0,P0 who presents with adopted children providing the history.  She complains of abdominal pain for the past 3 days.  She has had a feeling of abdominal fullness making it quite difficult for her to eat.  However, she has had no nausea or vomiting.  Has also not had a normal formed stool in the past several days.  Denies vaginal rectal or urinary tract bleeding, fever, or chills.  PAST MEDICAL HISTORY: 1. Diabetes. 2. Chronic hypertension. 3. History of CVA. 4. Anxiety disorder.  PAST SURGICAL HISTORY:  Cholecystectomy.  MEDICATIONS:  Glipizide, verapamil, lisinopril.  ALLERGIES:  No known drug allergies.  SOCIAL HISTORY:  Denies tobacco, alcohol, or drug abuse.  PHYSICAL EXAMINATION:  VITAL SIGNS:  Temperature 102 degrees, blood pressure is 90s to 120s/50s.  Pulse is regular at 60, respiratory rate 16. GENERAL:  The patient is status post morphine for her abdominal pain and she is somewhat difficult to understand.  History is provided by her adopted daughters. SKIN:  Warm and dry. LUNGS:  Clear to auscultation. HEART:  Regular rate and rhythm. BACK:  CVA tenderness.  No supraclavicular lymphadenopathy. ABDOMEN:  Soft.  Bowel sounds are positive, although decreased.  There are no rashes and no tinkles.  There is an obvious mass arising from the pelvis to above the level of the umbilicus. PELVIC EXAM:  Deferred.  LABORATORY DATA:  White count 14.0, hemoglobin 10.9, hematocrit 33.2, platelet count 178,000.  Sodium 133, potassium 2.9, glucose  27, creatinine 1.08.  Capillary glucose subsequently 78.  Chest x-ray no acute pulmonary findings, ileus versus gastroenteritis bowel pattern. Abdominopelvic CT, there is an 18 x 15 x 11 cm cystic mass arising from the left adnexa extending up into the lower abdomen.  This has given some compressive effect to the bowel.  However, there is no evidence of bowel perforation or frank obstruction.  There is no mention of ascites or lymphadenopathy, or organomegaly.  ASSESSMENT:  This is a 77 year old, G0-P0 with a large left adnexal mass.  This is highly suspicious for malignancy.  PLAN:  The patient will be admitted to the internal medicine service for supportive measures.  We will obtain a CA-125 level.  GYN Oncology will be consulted in the a.m.     Guy Sandifer Henderson Cloud, M.D.     JET/MEDQ  D:  08/17/2011  T:  08/18/2011  Job:  [id number]  Electronically Signed by Harold Hedge M.D. on 08/24/2011 02:05:42 PM

## 2011-08-25 DIAGNOSIS — N83519 Torsion of ovary and ovarian pedicle, unspecified side: Secondary | ICD-10-CM | POA: Insufficient documentation

## 2011-08-27 NOTE — Consult Note (Signed)
Erika Day, Erika Day                 ACCOUNT NO.:  [id number]  MEDICAL RECORD NO.:  [id number]  LOCATION:  1224                         FACILITY:  [facility]  PHYSICIAN:  Erika Eslick L. Ladona Ridgel, MD  DATE OF BIRTH:  Jun 11, 1930  DATE OF CONSULTATION:  08/19/2011 DATE OF DISCHARGE:  08/21/2011                                CONSULTATION   Consult was requested by Erika Day, M.D.  REASON FOR CONSULTATION:  Goals of care.  HISTORY OF PRESENT ILLNESS:  The patient is a 77 year old African American female, presented with nausea, vomiting, and lower abdominal pain x3 days.  The patient has a history of constipation with laxative use and so, soft watery stools have been present, but not considered abnormal.  A CT scan was completed in the ER showing a left ovarian cystic mass, which possibly was encompassing the left ovary.  The patient was treated with an NG tube and evaluated by GYN.  The patient's course was complicated by vital signs consistent with sepsis and shock. She was therefore admitted to the ICU.  Referral was made to Gynecology/Oncology and a consult is pending at the time of this evaluation.  The family expressed the following questions for GYN/ONC. 1. Surgical versus nonsurgical options. 2. If surgery, when and how this infection that she currently is being     treated for impede surgery?  What could postoperative scenario look     like in terms of everyday independence.  REVIEW OF SYSTEMS:  Limited by the patient's decreased level of consciousness.  The patient is sleeping, but arousable and could answer some questions, but not all.  She states that she is not nauseated and she is not vomiting now and at present, she has no pain.  The surrogate decision maker is her spouse, but the first call is to her adoptive daughter Erika Day.  Her phone number is [phone].  The patient's spouse has trouble getting to the phone and therefore contacting him is more appropriate.  PAST  MEDICAL HISTORY:  Significant for: 1. Hypertension. 2. Diabetes. 3. Dyslipidemia. 4. History of a CVA.  PAST SURGICAL HISTORY:  Cholecystectomy.  Her cardiology risk stratification was completed in 2010 prior to an appendectomy that showed a Myoview of 65% EF with no ischemia.  SOCIAL HISTORY:  She was married for the last 40 years.  She reared 3 non-biological daughters; Erika Day, and Erika Day.  She does not drink or smoke.  She is very committed to her [employer] faith as her daughter Erika Day states Erika Day is her favorite.  She collects baby dolls and loved to sew and cook.  FAMILY HISTORY:  Unable to be obtained from this patient.  ALLERGIES:  No known drug allergies.  MEDICATIONS:  Her medication list was reviewed.  Please see the MAR.  PHYSICAL EXAMINATION:  VITAL SIGNS:  Temperature is 99.6, blood pressure 114/49, pulse 90, respirations 20, saturation 98%. GENERAL:  She is arousable, but sleepy.  She has poor dentition.  Mucous membranes are dry.  Pupils equal, round, reactive to light.  She has a left IJ in place.  NG tube is in place. CHEST:  Decreased.  The crackles are at the  bases. CARDIOVASCULAR:  Regular rate and rhythm.  Positive S1 and S2.  No S3, S4.  No murmurs, rubs, or gallops. ABDOMEN:  Obese, nontender, nondistended.  There is no gurgling present. EXTREMITIES:  Show no edema in the lower extremities, but both hands are swollen. NEUROLOGIC:  She is sleepy, but arousable to gentle tactile and verbal stimulation.  ASSESSMENT AND PLAN:  This is a 77 year old African American female with newly diagnosed ovarian mass causing bowel obstruction.  The spouse is asserting a full code status.  Family needs to hear information regarding options for interventions whether surgical or nonsurgical. Once Gynecology/Oncology weighs her, we can further frame decisions for advance directives and disposition.  At this time, the goals of care are as  follows. 1. Full-code. 2. Nausea and vomiting.  I agree with NG tube, p.o. Zofran.     Obviously, definitive treatment would be a relief of the     obstruction. 3. Sepsis.  She remains on Levophed antibiotics and volume repletion.     Blood may or may not be able to be given.  This will be decided per     CCM. 4. Hypoglycemia, on D5, we will continue to monitor. 5. Pain, p.r.n. morphine as appropriate and she seems to be     comfortable at this time. 6. Agitation.  P.r.n. benzo would not be inappropriate.  Total time with this family was from 12:25 to 01:33 p.m.  Greater than 50% of the time was spent counseling and coordinating care.     Erika Kimberley L. Ladona Ridgel, MD     MLT/MEDQ  D:  08/23/2011  T:  08/23/2011  Job:  [id number]  cc:   Erika Day, M.D.  Electronically Signed by Derenda Mis MD on 08/27/2011 03:48:23 PM

## 2011-09-02 ENCOUNTER — Emergency Department (HOSPITAL_COMMUNITY): Payer: PRIVATE HEALTH INSURANCE

## 2011-09-02 ENCOUNTER — Inpatient Hospital Stay (HOSPITAL_COMMUNITY)
Admission: EM | Admit: 2011-09-02 | Discharge: 2011-09-10 | DRG: 690 | Disposition: A | Discharge status: Home Health | Payer: PRIVATE HEALTH INSURANCE | Attending: Internal Medicine | Admitting: Internal Medicine

## 2011-09-02 DIAGNOSIS — E1165 Type 2 diabetes mellitus with hyperglycemia: Secondary | ICD-10-CM | POA: Diagnosis present

## 2011-09-02 DIAGNOSIS — IMO0002 Reserved for concepts with insufficient information to code with codable children: Secondary | ICD-10-CM | POA: Diagnosis present

## 2011-09-02 DIAGNOSIS — E162 Hypoglycemia, unspecified: Secondary | ICD-10-CM | POA: Diagnosis present

## 2011-09-02 DIAGNOSIS — R42 Dizziness and giddiness: Secondary | ICD-10-CM | POA: Diagnosis present

## 2011-09-02 DIAGNOSIS — Z8673 Personal history of transient ischemic attack (TIA), and cerebral infarction without residual deficits: Secondary | ICD-10-CM

## 2011-09-02 DIAGNOSIS — Z8543 Personal history of malignant neoplasm of ovary: Secondary | ICD-10-CM

## 2011-09-02 DIAGNOSIS — E876 Hypokalemia: Secondary | ICD-10-CM | POA: Diagnosis not present

## 2011-09-02 DIAGNOSIS — B964 Proteus (mirabilis) (morganii) as the cause of diseases classified elsewhere: Secondary | ICD-10-CM | POA: Diagnosis present

## 2011-09-02 DIAGNOSIS — R197 Diarrhea, unspecified: Secondary | ICD-10-CM | POA: Diagnosis present

## 2011-09-02 DIAGNOSIS — I10 Essential (primary) hypertension: Secondary | ICD-10-CM | POA: Diagnosis present

## 2011-09-02 DIAGNOSIS — Z90710 Acquired absence of both cervix and uterus: Secondary | ICD-10-CM

## 2011-09-02 DIAGNOSIS — N390 Urinary tract infection, site not specified: Principal | ICD-10-CM | POA: Diagnosis present

## 2011-09-02 DIAGNOSIS — I498 Other specified cardiac arrhythmias: Secondary | ICD-10-CM | POA: Diagnosis not present

## 2011-09-02 LAB — DIFFERENTIAL
Basophils Relative: 0 % (ref 0–1)
Eosinophils Absolute: 0.1 10*3/uL (ref 0.0–0.7)
Eosinophils Relative: 1 % (ref 0–5)
Lymphocytes Relative: 34 % (ref 12–46)
Neutro Abs: 7.1 10*3/uL (ref 1.7–7.7)
Neutrophils Relative %: 60 % (ref 43–77)

## 2011-09-02 LAB — COMPREHENSIVE METABOLIC PANEL
ALT: 10 U/L (ref 0–35)
Albumin: 2.5 g/dL — ABNORMAL LOW (ref 3.5–5.2)
Calcium: 9.1 mg/dL (ref 8.4–10.5)
GFR calc Af Amer: 56 mL/min — ABNORMAL LOW (ref >90)
Glucose, Bld: 33 mg/dL — CL (ref 70–99)
Potassium: 2.8 mEq/L — ABNORMAL LOW (ref 3.5–5.1)
Sodium: 142 mEq/L (ref 135–145)
Total Protein: 5.7 g/dL — ABNORMAL LOW (ref 6.0–8.3)

## 2011-09-02 LAB — CBC
HCT: 34.4 % — ABNORMAL LOW (ref 36.0–46.0)
Hemoglobin: 11 g/dL — ABNORMAL LOW (ref 12.0–15.0)
MCV: 83.9 fL (ref 78.0–100.0)
RBC: 4.1 MIL/uL (ref 3.87–5.11)
WBC: 11.8 10*3/uL — ABNORMAL HIGH (ref 4.0–10.5)

## 2011-09-02 LAB — URINE MICROSCOPIC-ADD ON

## 2011-09-02 LAB — URINALYSIS, ROUTINE W REFLEX MICROSCOPIC
Bilirubin Urine: NEGATIVE
Glucose, UA: NEGATIVE mg/dL
Specific Gravity, Urine: 1.014 (ref 1.005–1.030)
Urobilinogen, UA: 1 mg/dL (ref 0.0–1.0)
pH: 8.5 — ABNORMAL HIGH (ref 5.0–8.0)

## 2011-09-02 LAB — GLUCOSE, CAPILLARY
Glucose-Capillary: 81 mg/dL (ref 70–99)
Glucose-Capillary: 76 mg/dL (ref 70–99)

## 2011-09-02 NOTE — H&P (Signed)
NAMEMEGHEN, AKOPYAN                 ACCOUNT NO.:  [id number]  MEDICAL RECORD NO.:  [id number]  LOCATION:  WLED                         FACILITY:  [facility]  PHYSICIAN:  Ladell Pier, M.D.   DATE OF BIRTH:  11-Jan-1930  DATE OF ADMISSION:  08/17/2011 DATE OF DISCHARGE:                             HISTORY & PHYSICAL   CHIEF COMPLAINT:  Abdominal pain, nausea, and vomiting.  HISTORY OF PRESENT ILLNESS:  The patient is a 77 year old female with past medical history significant for diabetes, hypertension, and dyslipidemia.  The patient presented to the hospital secondary to nausea, vomiting, and abdominal pain for the past 3 days.  She stated that she has been having bowel movements as watery, but that is normal for her because she always use some sort of laxative.  She has no chest pain.  No shortness of breath.  No other complaints.  In the ED, she was noted to be hypoglycemic, so we were asked to admit her for hypoglycemia, nausea, vomiting, and abdominal pain.  CT scan also showed that she had a large ovarian mass, possibly causing some obstruction.  PAST MEDICAL HISTORY:  Significant for: 1. Diabetes. 2. Hypertension. 3. Dyslipidemia. 4. Anxiety. 5. Degenerative joint disease. 6. History of CVA in 1997.  FAMILY HISTORY:  Both parents are deceased, unsure of the etiology of their death.  SOCIAL HISTORY:  The patient quit tobacco back in 1997, smoked for many years.  She does not drink.  She is married.  She does not have any children.  She is retired.  MEDICATIONS: 1. Verapamil ER 240 mg daily. 2. Lisinopril 40 mg daily. 3. Ativan 1 mg t.i.d. p.r.n. 4. Glipizide XL 10 mg twice daily. 5. Aspirin 325 mg daily.  ALLERGIES:  None.  REVIEW OF SYSTEMS:  Negative, otherwise stated in HPI.  PHYSICAL EXAMINATION:  VITAL SIGNS:  Temperature 99.5, pulse of 56, respirations 16, blood pressure 151/49 to 95/65, and pulse oximetry 98% on room air. GENERAL:  The patient is sitting  up on the stretcher, well-nourished female. HEENT:  Normocephalic and atraumatic.  Pupils reactive to light.  Throat is without erythema.  She does have poor dentition. CARDIOVASCULAR:  Regular rate and rhythm. LUNGS:  Clear bilaterally. ABDOMEN:  Soft with some tenderness in the left lower quadrant. Positive bowel sounds. EXTREMITIES:  Without edema.  LABORATORY DATA:  CT scan of the abdomen and pelvis shows large cystic mass, which appears to arise from the left ovary extending into the lower abdomen most likely cystic ovarian neoplasm.  This compresses the small bowel loops and sigmoid colon in the pelvis possibly causing some degree of mechanical obstruction within the colon as the colon proximal to this is filled with food and gas.  UA negative.  Lipase 20, sodium 133, potassium 2.9, chloride 96, CO2 of 24, glucose 27, BUN 12, and creatinine 1.08.  WBCs 14.0, hemoglobin 10.9, MCV 81.2, and platelets of 178.  ASSESSMENT AND PLAN: 1. Large ovarian mass. 2. Nausea and vomiting possibly secondary to some obstruction. 3. Hypoglycemia secondary to medications. 4. Hypokalemia. 5. Hypertension. 6. Anxiety. 7. Leukocytosis. We will admit the patient to the hospital as far as the ovarian mass. We  will get CA-125 levels.  Gynecology, Dr. Henderson Cloud has been consulted, recommend consulting Gynecology/Oncology to see the patient tomorrow. We will treat her with antiemetics for her nausea and vomiting.  We will put her on D5 normal saline for the hypoglycemia and hold onto her glipizide, put on a clear liquid diet.  For the potassium, we will replete her potassium and check a magnesium level.  We will continue her antihypertensives and hold for systolic less than 110.  Continue her Ativan for her anxiety and leukocytosis could be demargination from the acute process, so we will monitor her CBC.  Time spent with the patient and family and doing this admission is approximately 1  hour.     Ladell Pier, M.D.     NJ/MEDQ  D:  08/17/2011  T:  08/17/2011  Job:  [id number]  cc:   Lorelle Formosa, M.D. Fax: [phone]  Electronically Signed by Ladell Pier M.D. on 09/02/2011 09:54:16 PM

## 2011-09-03 LAB — GLUCOSE, CAPILLARY
Glucose-Capillary: 106 mg/dL — ABNORMAL HIGH (ref 70–99)
Glucose-Capillary: 111 mg/dL — ABNORMAL HIGH (ref 70–99)
Glucose-Capillary: 116 mg/dL — ABNORMAL HIGH (ref 70–99)
Glucose-Capillary: 203 mg/dL — ABNORMAL HIGH (ref 70–99)
Glucose-Capillary: 65 mg/dL — ABNORMAL LOW (ref 70–99)
Glucose-Capillary: 72 mg/dL (ref 70–99)
Glucose-Capillary: 73 mg/dL (ref 70–99)
Glucose-Capillary: 82 mg/dL (ref 70–99)

## 2011-09-03 LAB — COMPREHENSIVE METABOLIC PANEL
Albumin: 1.9 g/dL — ABNORMAL LOW (ref 3.5–5.2)
BUN: 9 mg/dL (ref 6–23)
Calcium: 8.1 mg/dL — ABNORMAL LOW (ref 8.4–10.5)
GFR calc Af Amer: 64 mL/min — ABNORMAL LOW (ref >90)
Glucose, Bld: 74 mg/dL (ref 70–99)
Potassium: 4.3 mEq/L (ref 3.5–5.1)
Total Protein: 4.8 g/dL — ABNORMAL LOW (ref 6.0–8.3)

## 2011-09-03 LAB — CBC
HCT: 29.7 % — ABNORMAL LOW (ref 36.0–46.0)
Hemoglobin: 10 g/dL — ABNORMAL LOW (ref 12.0–15.0)
MCV: 81.6 fL (ref 78.0–100.0)
RBC: 3.64 MIL/uL — ABNORMAL LOW (ref 3.87–5.11)
WBC: 11.2 10*3/uL — ABNORMAL HIGH (ref 4.0–10.5)

## 2011-09-03 LAB — CLOSTRIDIUM DIFFICILE BY PCR: Toxigenic C. Difficile by PCR: NEGATIVE

## 2011-09-03 NOTE — Discharge Summary (Signed)
Erika Day, Erika Day                 ACCOUNT NO.:  [id number]  MEDICAL RECORD NO.:  [id number]  LOCATION:  1224                         FACILITY:  [facility]  PHYSICIAN:  Kalman Shan, MD   DATE OF BIRTH:  07-08-1930  DATE OF ADMISSION:  08/17/2011 DATE OF DISCHARGE:                              DISCHARGE SUMMARY   HISTORY OF PRESENT ILLNESS:  This is a 77 year old African American female, who presented with 3 days history of abdominal pain, watery bowel movements and 1 day of progressive delirium and episode of hypoglycemia.  Diagnostic evaluation revealed a large ovarian mass.  She was admitted to the pulmonary critical care service and GYN surgical evaluation was instituted.  While in the ER, she developed progressive hypertension.  Pulmonary Critical Care assumed her care and she was admitted to the intensive care unit.  While in the intensive, she had a left internal jugular CVL placed on October 16th.  Blood cultures x2 on October 16th, showed no growth to date.  Urine culture was negative. Procalcitonin was 1.20.  Antibiotics consist of vancomycin and Zosyn. Medications for her consists of Lovenox and proton pump inhibitor.  PROTOCOLS AND CONSULTS:  She had a GYN/Surgical, October 16th.  She had early goal-directed therapy protocol, October 16th, which resolved on October 18th.  She had hyperglycemia protocol, which was started on October 16th and GYN/Oncology on October 17th.  DISCHARGE DIAGNOSES: 1. Septic shock and sepsis. 2. Acute renal failure. 3. Abdominal pain and tenderness. 4. Ovarian cancer. 5. Encephalopathy. 6. Anemia. 7. Hypoglycemia.  LABORATORY DATA:  Sodium 136, potassium 3.3, chloride 110, CO2 is 19, glucose 183, BUN is 11, creatinine 1.15, and calcium is 8.3.  Hemoglobin 8.3, hematocrit 24.9, WBCs 6.7, and platelets are 163.  Chest x-ray shows cardiac enlargement with increased pulmonary vascularity suggesting congestive changes that are progressive  since previous study. Shallow inspirations.  Probable bibasilar atelectasis and effusions bilaterally.  NG tube is in unchanged position.  The central venous catheter is in the mid SVC.  No pneumothorax.  HOSPITAL COURSE BY DISCHARGE DIAGNOSES: 1. Septic shock.  She was treated with IV fluids and vasopressors.     She has been off vasopressors since 0700 on October 18th.  At the     time of this dictation at 1400 hours, the plan was for her to be     after being successfully fluid resuscitated and wean from pressors,     to transfer to [facility] for further evaluation of her     ovarian cancer by the GYN Oncology Service. 2. Acute renal failure, which has resolved.  Her creatinine is 1.15     and had reached a peak of 1.95, most likely responded to IV fluids. 3. Abdominal pain and tenderness.  She has a large ovarian mass.  She     had an NG tube placed for decompression.  She has a consult from     GYN Oncology.  We feel like she needs to be transferred to [facility] to the service there for further evaluation and     treatment. 4. Encephalopathy, which is resolving with treatment of sepsis.  5. Anemia, which remains adequately treated with hemoglobin of 8.3.     We normally transfuse if less than 7.0. 6. Hypoglycemia.  Noted she was hypoglycemic and hyperglycemic.  There     she was left on D5 and half normal saline at 50 and then placed on     very sensitive sliding scale insulin.  She stated she had stress-     dose steroids added and probably aggravated her diabetes mellitus.  DISCHARGE MEDICATIONS: 1. Vancomycin 1750 mg IV q.12 h. 2. Zosyn 3.375 g IV q.8 h. 3. Protonix 40 mg IV daily. 4. She is on D5 half normal saline at 50 cc an hour. 5. Lovenox 40 mg every 24 hours. 6. She is on sliding scale insulin. 7. She is on Solu-Cortef 50 mg IV q.6 h. 8. She has been off her dobutamine and off her Neo-Synephrine. Her vital signs at the time of discharge, blood  pressure is 133/62 without pressor support.  Her central venous pressure was 11, heart rate 69, her Glasgow Coma Score is 14, respiratory rate is 18, and saturations are 98% on room air.  Her diet is currently, she is n.p.o. She had the plans for transfer to [facility] to the GYN Oncology floor for further evaluation and treatment.  As per [facility], her disposition, condition on discharge is improved.> 35 min dc planning  Devra Dopp, MSN, ACNP   ______________________________ Kalman Shan, MD    SM/MEDQ  D:  08/20/2011  T:  08/20/2011  Job:  [id number]  cc:   De Blanch, M.D.  Electronically Signed by Devra Dopp MSN ACNP on 08/26/2011 05:45:56 AM Electronically Signed by Kalman Shan MD on 09/03/2011 06:17:18 PM

## 2011-09-04 LAB — CBC
HCT: 30.1 % — ABNORMAL LOW (ref 36.0–46.0)
Platelets: 181 10*3/uL (ref 150–400)
RBC: 3.63 MIL/uL — ABNORMAL LOW (ref 3.87–5.11)
RDW: 15.7 % — ABNORMAL HIGH (ref 11.5–15.5)
WBC: 9.4 10*3/uL (ref 4.0–10.5)

## 2011-09-04 LAB — GLUCOSE, CAPILLARY
Glucose-Capillary: 85 mg/dL (ref 70–99)
Glucose-Capillary: 85 mg/dL (ref 70–99)
Glucose-Capillary: 106 mg/dL — ABNORMAL HIGH (ref 70–99)
Glucose-Capillary: 101 mg/dL — ABNORMAL HIGH (ref 70–99)

## 2011-09-04 LAB — BASIC METABOLIC PANEL
BUN: 8 mg/dL (ref 6–23)
Chloride: 106 mEq/L (ref 96–112)
GFR calc Af Amer: 64 mL/min — ABNORMAL LOW (ref >90)
Potassium: 3.4 mEq/L — ABNORMAL LOW (ref 3.5–5.1)

## 2011-09-04 LAB — URINE CULTURE
Colony Count: >=100000
Culture  Setup Time: 201210312122

## 2011-09-04 NOTE — H&P (Signed)
NAMEGLORIE, DOWLEN                 ACCOUNT NO.:  [id number]  MEDICAL RECORD NO.:  [id number]  LOCATION:  WLED                         FACILITY:  [facility]  PHYSICIAN:  Kela Millin, M.D.DATE OF BIRTH:  05-Dec-1929  DATE OF ADMISSION:  09/02/2011 DATE OF DISCHARGE:                             HISTORY & PHYSICAL   PRIMARY CARE PHYSICIAN:  Unassigned.  CHIEF COMPLAINT:  Dizziness and low blood sugars at home.  HISTORY OF PRESENT ILLNESS:  The patient is a 77 year old black female, whose past medical history is significant for ovarian cancer for which she is status post surgery at [facility] and was just discharged home 2 days ago, diabetes mellitus, hypertension, dyslipidemia, history of CVA, recent admission in October for septic shock (prior to her transfer to [facility] anemia who presents with the above complaints.  She states that since her discharge home from [facility] 2 days ago, she has had decreased p.o. intake due to having no appetite.  She began having dizziness, generalized malaise, and also admits to subjective fevers.  She checked her blood sugars and they were found to be low and so EMS was called and they report that her blood sugar was 30 and she was given glucagon and after an IV was started, she was given an ampule of D50 and her blood glucose improved to 116 prior to arrival in the ED.  After she came to the ED, the BMET revealed a blood glucose of 33 and she was given D50, started on D5 drip and given POs as well.  Initially, her blood glucose improved to 81, but subsequently on recheck again it had dropped to 40. Even on the D5 drip, she continued to require frequent ampules of D50. Urinalysis was done in the ED and it was consistent with the UTI, and the patient was started on empiric antibiotics.  The patient also reports diarrhea, 3 to 4 watery stools a day over the past 2 days, nonbloody.  Per the nursing staff, she had a large diarrheal stool in the ED as well.  Lab work  in the ED, also revealed a white cell count of 11.8.  Chest x-ray showed an opacity in the left lung base, atelectasis versus effusion.  She denied cough and no shortness of breath.  She is admitted for further evaluation and management.  PAST MEDICAL HISTORY: 1. As above. 2. History of acute renal failure. 3. History of encephalopathy. 4. History of anxiety.  MEDICATIONS: 1. Glipizide 10 mg p.o. b.i.d. 2. Lisinopril 40 mg p.o. daily. 3. Verapamil 240 mg p.o. daily. 4. Ativan 1 mg p.o. t.i.d. p.r.n. 5. Aspirin 325 mg p.o. daily.  ALLERGIES:  NKDA.  SOCIAL HISTORY:  She quit tobacco about 10 years ago.  She denies alcohol.  FAMILY HISTORY:  Noncontributory to current illness.  REVIEW OF SYSTEMS:  As per HPI, other review of systems negative.  PHYSICAL EXAM:  GENERAL:  The patient is an obese, elderly, black female, in no apparent distress.  She is alert and conversant. NECK:  Supple. VITAL SIGNS:  Temperature is 98.2, blood pressure is 166/58 with a pulse of 70, respiratory rate of 17, O2 sat of 100% on room  air. HEENT:  PERRL, EOMI.  Sclerae anicteric.  Slightly dry mucous membranes. No oral exudates. NECK:  Supple.  No adenopathy.  No thyromegaly.  No JVD. LUNGS:  Decreased breath sounds to the bases.  No crackles or wheezes. CARDIOVASCULAR:  Regular rate and rhythm.  Normal S1, S2. ABDOMEN:  Soft, bowel sounds present.  She has an infraumbilical midline incision with staples in place.  No erythema or drainage noted. EXTREMITIES:  No cyanosis or edema. NEUROLOGIC:  She is alert and oriented x3, cranial nerves 2-12 grossly intact.  Nonfocal exam.  LABORATORY DATA:  As per HPI.  Also, urinalysis with large leukocyte esterase, 21-50 wbcs and many bacteria.  White cell count is 11.8 with a hemoglobin of 11, hematocrit of 34.4, platelet count of 294.  The sodium is 142 with potassium of 2.8, chloride 108, CO2 of 26, glucose is initially 33.  BUN is 10, creatinine  1.27.  ASSESSMENT AND PLAN: 1. Hypoglycemia-as discussed above.  Likely secondary to decreased     p.o. intake.  Status post surgery with continued intake of her     glipizide and also in the setting of infection-UTI as above.  We     will change fluids to D10 as the patient is continuing to be     hypoglycemic on D5.  Monitor Accu-Cheks closely in step-down unit     and further manage as appropriate.  The patient is also having     diarrhea as above and we will obtain stool studies, and further     manage as appropriate. 2. Diarrhea-as above and check for C. difficile.  She was recently     treated with antibiotics, follow and further treat accordingly. 3. Hypokalemia-replace potassium. 4. Ovarian cancer-status post surgery at [facility] as discussed above and     just discharged home 2 days ago. 5. Hypertension-continue outpatient medications, monitor and further     treat accordingly. 6. History of diabetes mellitus-holding hypoglycemic agents for now     secondary to hypoglycemia.  Follow and consider starting alternate     meds for diabetes if hypoglycemia is resolved. 7. History of anemia-hemoglobin stable, follow.     Kela Millin, M.D.     ACV/MEDQ  D:  09/02/2011  T:  09/03/2011  Job:  [id number]  Electronically Signed by Donnalee Curry M.D. on 09/04/2011 07:38:16 AM

## 2011-09-05 LAB — GLUCOSE, CAPILLARY
Glucose-Capillary: 89 mg/dL (ref 70–99)
Glucose-Capillary: 82 mg/dL (ref 70–99)
Glucose-Capillary: 188 mg/dL — ABNORMAL HIGH (ref 70–99)
Glucose-Capillary: 134 mg/dL — ABNORMAL HIGH (ref 70–99)
Glucose-Capillary: 133 mg/dL — ABNORMAL HIGH (ref 70–99)

## 2011-09-05 LAB — BASIC METABOLIC PANEL
CO2: 29 mEq/L (ref 19–32)
Calcium: 8.8 mg/dL (ref 8.4–10.5)
Creatinine, Ser: 1 mg/dL (ref 0.50–1.10)
Glucose, Bld: 78 mg/dL (ref 70–99)

## 2011-09-05 MED ORDER — DEXTROSE 10 % IV SOLN
INTRAVENOUS | Status: DC
  Administered 2011-09-06: 50 mL/h via INTRAVENOUS
  Filled 2011-09-05: qty 1000

## 2011-09-05 MED ORDER — LISINOPRIL 40 MG PO TABS
40.0000 mg | ORAL_TABLET | Freq: Every day | ORAL | Status: DC
  Administered 2011-09-06 – 2011-09-10 (×6): 40 mg via ORAL
  Filled 2011-09-05 (×6): qty 1

## 2011-09-05 MED ORDER — FUROSEMIDE 20 MG PO TABS
20.0000 mg | ORAL_TABLET | Freq: Every day | ORAL | Status: DC
  Administered 2011-09-06: 20 mg via ORAL
  Filled 2011-09-05 (×3): qty 1

## 2011-09-05 MED ORDER — FUROSEMIDE 40 MG PO TABS
40.0000 mg | ORAL_TABLET | Freq: Every day | ORAL | Status: DC
  Filled 2011-09-05 (×2): qty 1

## 2011-09-05 MED ORDER — LORAZEPAM 0.5 MG PO TABS: 0.5000 mg | ORAL_TABLET | Freq: Two times a day (BID) | ORAL | Status: DC | PRN

## 2011-09-05 MED ORDER — ONDANSETRON HCL 4 MG/2ML IJ SOLN: 4.0000 mg | Freq: Four times a day (QID) | INTRAMUSCULAR | Status: DC | PRN

## 2011-09-05 MED ORDER — ONDANSETRON HCL 4 MG PO TABS
4.0000 mg | ORAL_TABLET | Freq: Four times a day (QID) | ORAL | Status: DC | PRN
  Filled 2011-09-05: qty 1

## 2011-09-05 MED ORDER — ACETAMINOPHEN 325 MG PO TABS: 650.0000 mg | ORAL_TABLET | ORAL | Status: DC | PRN

## 2011-09-05 MED ORDER — ASPIRIN EC 325 MG PO TBEC
325.0000 mg | DELAYED_RELEASE_TABLET | Freq: Every day | ORAL | Status: DC
  Administered 2011-09-06 – 2011-09-10 (×5): 325 mg via ORAL
  Filled 2011-09-05 (×6): qty 1

## 2011-09-05 MED ORDER — VERAPAMIL HCL ER 240 MG PO TBCR
240.0000 mg | EXTENDED_RELEASE_TABLET | Freq: Every day | ORAL | Status: DC
  Administered 2011-09-06 – 2011-09-07 (×2): 240 mg via ORAL
  Filled 2011-09-05 (×4): qty 1

## 2011-09-05 MED ORDER — DEXTROSE 5 % IV SOLN
1.0000 g | INTRAVENOUS | Status: DC
  Administered 2011-09-06 – 2011-09-09 (×4): 1 g via INTRAVENOUS
  Filled 2011-09-05 (×8): qty 10

## 2011-09-05 MED ORDER — HYDROCODONE-ACETAMINOPHEN 5-325 MG PO TABS: 1.0000 | ORAL_TABLET | ORAL | Status: DC | PRN

## 2011-09-06 DIAGNOSIS — E1165 Type 2 diabetes mellitus with hyperglycemia: Secondary | ICD-10-CM | POA: Diagnosis present

## 2011-09-06 DIAGNOSIS — I10 Essential (primary) hypertension: Secondary | ICD-10-CM | POA: Diagnosis present

## 2011-09-06 DIAGNOSIS — E162 Hypoglycemia, unspecified: Secondary | ICD-10-CM | POA: Diagnosis present

## 2011-09-06 DIAGNOSIS — N390 Urinary tract infection, site not specified: Secondary | ICD-10-CM | POA: Diagnosis present

## 2011-09-06 DIAGNOSIS — R197 Diarrhea, unspecified: Secondary | ICD-10-CM | POA: Diagnosis present

## 2011-09-06 LAB — GLUCOSE, CAPILLARY
Glucose-Capillary: 94 mg/dL (ref 70–99)
Glucose-Capillary: 82 mg/dL (ref 70–99)
Glucose-Capillary: 86 mg/dL (ref 70–99)
Glucose-Capillary: 125 mg/dL — ABNORMAL HIGH (ref 70–99)
Glucose-Capillary: 164 mg/dL — ABNORMAL HIGH (ref 70–99)
Glucose-Capillary: 144 mg/dL — ABNORMAL HIGH (ref 70–99)
Glucose-Capillary: 119 mg/dL — ABNORMAL HIGH (ref 70–99)

## 2011-09-06 LAB — BASIC METABOLIC PANEL
CO2: 25 mEq/L (ref 19–32)
Calcium: 9.1 mg/dL (ref 8.4–10.5)
Chloride: 103 mEq/L (ref 96–112)
Glucose, Bld: 81 mg/dL (ref 70–99)
Sodium: 137 mEq/L (ref 135–145)

## 2011-09-06 MED ORDER — DEXTROSE-NACL 5-0.9 % IV SOLN
INTRAVENOUS | Status: DC
  Administered 2011-09-06 – 2011-09-08 (×3): via INTRAVENOUS

## 2011-09-06 NOTE — Plan of Care (Signed)
Problem: Phase I Progression Outcomes Goal: OOB as tolerated unless otherwise ordered Outcome: Progressing Up to chair X1

## 2011-09-06 NOTE — Progress Notes (Signed)
Subjective: Pt states she feels better this am. Denies n/v, also no diarrhea Objective: Vital signs in last 24 hours: Filed Vitals:   09/05/11 1600 09/06/11 0000 09/06/11 0400 09/06/11 0800  BP: 134/48 168/60 141/51 160/65  Pulse:  66 68 64  Temp: 98.1 F (36.7 C) 99.2 F (37.3 C) 98.6 F (37 C) 98.1 F (36.7 C)  TempSrc: Oral Oral Oral Oral  Resp:  16 19 22   Height:  4' 11.8" (1.519 m)    Weight:  88.5 kg (195 lb 1.7 oz)    SpO2:  99% 99% 99%   Weight change:   Intake/Output Summary (Last 24 hours) at 09/06/11 1017 Last data filed at 09/06/11 0700  Gross per 24 hour  Intake    950 ml  Output   1325 ml  Net   -375 ml       Lungs:     Clear to auscultation bilaterally, respirations unlabored   Heart:    Regular rate and rhythm, S1 and S2 normal, no murmur, rub   or gallop  Abdomen:     Soft, non-tender, bowel sounds active all four quadrants,    no masses, no organomegaly  Extremities:   Extremities normal, atraumatic, no cyanosis or edema  Neurologic:   CNII-XII intact, normal strength, sensation and reflexes    throughout    Lab Results: Results for orders placed during the hospital encounter of 09/02/11 (from the past 24 hour(s))  GLUCOSE, CAPILLARY     Status: Abnormal   Collection Time   09/05/11 12:00 PM      Component Value Range   Glucose-Capillary 100 (*) 70 - 99 (mg/dL)   Comment 1 Notify RN     Comment 2 Documented in Chart    GLUCOSE, CAPILLARY     Status: Abnormal   Collection Time   09/05/11  2:26 PM      Component Value Range   Glucose-Capillary 188 (*) 70 - 99 (mg/dL)  GLUCOSE, CAPILLARY     Status: Abnormal   Collection Time   09/05/11  4:04 PM      Component Value Range   Glucose-Capillary 134 (*) 70 - 99 (mg/dL)   Comment 1 Documented in Chart     Comment 2 Notify RN    GLUCOSE, CAPILLARY     Status: Abnormal   Collection Time   09/05/11  5:46 PM      Component Value Range   Glucose-Capillary 133 (*) 70 - 99 (mg/dL)  GLUCOSE, CAPILLARY      Status: Abnormal   Collection Time   09/05/11  8:14 PM      Component Value Range   Glucose-Capillary 152 (*) 70 - 99 (mg/dL)  GLUCOSE, CAPILLARY     Status: Abnormal   Collection Time   09/06/11 12:00 AM      Component Value Range   Glucose-Capillary 120 (*) 70 - 99 (mg/dL)   Comment 1 Documented in Chart     Comment 2 Notify RN    GLUCOSE, CAPILLARY     Status: Normal   Collection Time   09/06/11  3:14 AM      Component Value Range   Glucose-Capillary 94  70 - 99 (mg/dL)   Comment 1 Documented in Chart     Comment 2 Notify RN    BASIC METABOLIC PANEL     Status: Abnormal   Collection Time   09/06/11  4:10 AM      Component Value Range   Sodium 137  135 - 145 (mEq/L)   Potassium 4.5  3.5 - 5.1 (mEq/L)   Chloride 103  96 - 112 (mEq/L)   CO2 25  19 - 32 (mEq/L)   Glucose, Bld 81  70 - 99 (mg/dL)   BUN 6  6 - 23 (mg/dL)   Creatinine, Ser 1.61  0.50 - 1.10 (mg/dL)   Calcium 9.1  8.4 - 09.6 (mg/dL)   GFR calc non Af Amer 59 (*) >90 (mL/min)   GFR calc Af Amer 69 (*) >90 (mL/min)  GLUCOSE, CAPILLARY     Status: Normal   Collection Time   09/06/11  6:44 AM      Component Value Range   Glucose-Capillary 82  70 - 99 (mg/dL)  GLUCOSE, CAPILLARY     Status: Normal   Collection Time   09/06/11  7:47 AM      Component Value Range   Glucose-Capillary 86  70 - 99 (mg/dL)   Micro Results: Recent Results (from the past 240 hour(s))  URINE CULTURE     Status: Normal   Collection Time   09/02/11 12:50 PM      Component Value Range Status Comment   Specimen Description URINE, CLEAN CATCH   Final    Special Requests NONE   Final    Setup Time [id number]   Final    Colony Count >=100,000 COLONIES/ML   Final    Culture PROTEUS MIRABILIS   Final    Report Status 09/04/2011 FINAL   Final    Organism ID, Bacteria PROTEUS MIRABILIS   Final   MRSA PCR SCREENING     Status: Normal   Collection Time   09/03/11  2:03 AM      Component Value Range Status Comment   MRSA by PCR NEGATIVE   NEGATIVE  Final   CLOSTRIDIUM DIFFICILE BY PCR     Status: Normal   Collection Time   09/03/11  7:45 AM      Component Value Range Status Comment   C difficile by pcr NEGATIVE  NEGATIVE  Final   STOOL CULTURE     Status: Normal (Preliminary result)   Collection Time   09/03/11  7:35 PM      Component Value Range Status Comment   Specimen Description STOOL   Final    Special Requests IMMUNE:NORM   Final    Culture NO SUSPICIOUS COLONIES, CONTINUING TO HOLD   Final    Report Status PENDING   Incomplete    Studies/Results: No results found. Scheduled Meds:   . aspirin EC  325 mg Oral Daily  . cefTRIAXone (ROCEPHIN) IVPB 1 gram/50 mL D5W  1 g Intravenous Q24H  . furosemide  20 mg Oral Daily  . lisinopril  40 mg Oral Daily  . verapamil  240 mg Oral Daily  . DISCONTD: furosemide  40 mg Oral Daily   Continuous Infusions:   . dextrose 50 mL/hr (09/06/11 0000)   PRN Meds:.acetaminophen, HYDROcodone-acetaminophen, LORazepam, ondansetron, ondansetron Assessment/Plan  1. Hypoglycemia - resolving on D10, will change IVF to D5 and follow 2. UTI, Proteus mirabilis - will continue current abx 3. Uncontrolled Dm- monitor cbgs and resume meds when appropriate 4.Diarrhea- resolved 5.HTN- continue current meds.      LOS: 4 days   Sayf Kerner C 09/06/2011, 10:17 AM

## 2011-09-07 LAB — STOOL CULTURE

## 2011-09-07 LAB — GLUCOSE, CAPILLARY
Glucose-Capillary: 171 mg/dL — ABNORMAL HIGH (ref 70–99)
Glucose-Capillary: 91 mg/dL (ref 70–99)
Glucose-Capillary: 113 mg/dL — ABNORMAL HIGH (ref 70–99)
Glucose-Capillary: 111 mg/dL — ABNORMAL HIGH (ref 70–99)

## 2011-09-07 LAB — BASIC METABOLIC PANEL
BUN: 8 mg/dL (ref 6–23)
Calcium: 8.7 mg/dL (ref 8.4–10.5)
Chloride: 102 mEq/L (ref 96–112)
Creatinine, Ser: 0.98 mg/dL (ref 0.50–1.10)
GFR calc Af Amer: 62 mL/min — ABNORMAL LOW (ref >90)

## 2011-09-07 NOTE — Progress Notes (Signed)
Spoke to patient/patient's dtr([name])Spouse [name] [phone] if home care is needed,then [facility] chosen. Will await PT/OT cons when MD feel appropriate.

## 2011-09-07 NOTE — Progress Notes (Signed)
Subjective: Pt denies any c/o. Per nsg staff brady while sleeping overnight assymptomatic Objective: Vital signs in last 24 hours: Temp:  [98.2 F (36.8 C)-98.8 F (37.1 C)] 98.3 F (36.8 C) (11/05 0400) Pulse Rate:  [65-70] 66  (11/05 0000) Resp:  [13-21] 13  (11/05 0400) BP: (135-157)/(54-68) 142/54 mmHg (11/05 0400) SpO2:  [97 %-99 %] 98 % (11/05 0400)   Intake/Output from previous day: 11/04 0701 - 11/05 0700 In: 2790 [P.O.:565; I.V.:2175; IV Piggyback:50] Out: 1377 [Urine:1375; Stool:2] Intake/Output this shift:      General Appearance:    Alert, cooperative, no distress, appears stated age  Lungs:     Clear to auscultation bilaterally, respirations unlabored   Heart:    Regular rate and rhythm, S1 and S2 normal, no murmur, rub   or gallop  Abdomen:     Soft, non-tender, bowel sounds active all four quadrants,    no masses, no organomegaly  Extremities:   Extremities normal, atraumatic, no cyanosis or edema       Weight change:   Intake/Output Summary (Last 24 hours) at 09/07/11 0842 Last data filed at 09/07/11 0600  Gross per 24 hour  Intake   2740 ml  Output   1377 ml  Net   1363 ml    Lab Results:   Basename 09/07/11 0615 09/06/11 0410  NA 136 137  K 3.5 4.5  CL 102 103  CO2 26 25  GLUCOSE 90 81  BUN 8 6  CREATININE 0.98 0.90  CALCIUM 8.7 9.1   No results found for this basename: WBC:2,HGB:2,HCT:2,PLT:2,MCV:2 in the last 72 hours PT/INR No results found for this basename: LABPROT:2,INR:2 in the last 72 hours ABG No results found for this basename: PHART:2,PCO2:2,PO2:2,HCO3:2 in the last 72 hours  Micro Results: Recent Results (from the past 240 hour(s))  URINE CULTURE     Status: Normal   Collection Time   09/02/11 12:50 PM      Component Value Range Status Comment   Specimen Description URINE, CLEAN CATCH   Final    Special Requests NONE   Final    Setup Time [id number]   Final    Colony Count >=100,000 COLONIES/ML   Final    Culture  PROTEUS MIRABILIS   Final    Report Status 09/04/2011 FINAL   Final    Organism ID, Bacteria PROTEUS MIRABILIS   Final   MRSA PCR SCREENING     Status: Normal   Collection Time   09/03/11  2:03 AM      Component Value Range Status Comment   MRSA by PCR NEGATIVE  NEGATIVE  Final   CLOSTRIDIUM DIFFICILE BY PCR     Status: Normal   Collection Time   09/03/11  7:45 AM      Component Value Range Status Comment   C difficile by pcr NEGATIVE  NEGATIVE  Final   STOOL CULTURE     Status: Normal   Collection Time   09/03/11  7:35 PM      Component Value Range Status Comment   Specimen Description STOOL   Final    Special Requests IMMUNE:NORM   Final    Culture     Final    Value: NO SALMONELLA, SHIGELLA, CAMPYLOBACTER, OR YERSINIA ISOLATED   Report Status 09/07/2011 FINAL   Final    Studies/Results: No results found.  Scheduled Meds:   . aspirin EC  325 mg Oral Daily  . cefTRIAXone (ROCEPHIN) IVPB 1 gram/50 mL D5W  1  g Intravenous Q24H  . furosemide  20 mg Oral Daily  . lisinopril  40 mg Oral Daily  . verapamil  240 mg Oral Daily   Continuous Infusions:   . dextrose 5 % and 0.9% NaCl 75 mL/hr at 09/07/11 0600  . DISCONTD: dextrose 50 mL/hr (09/06/11 0000)   PRN Meds:.acetaminophen, HYDROcodone-acetaminophen, LORazepam, ondansetron, ondansetron Assessment/Plan:  1. Hypoglycemia - cbgs borderline on d5, but no further hypoglycemic episodes 2. UTI, Proteus mirabilis - will continue current abx  3. Uncontrolled Dm- continue monitoring cbgs and resume meds when appropriate  4.HTN- continue current meds.  5.Asymptomatic brady - monitor on Tele. Will transfer to tele.     LOS: 5 days   Else Habermann C 09/07/2011, 8:42 AM

## 2011-09-08 LAB — GLUCOSE, CAPILLARY
Glucose-Capillary: 110 mg/dL — ABNORMAL HIGH (ref 70–99)
Glucose-Capillary: 106 mg/dL — ABNORMAL HIGH (ref 70–99)
Glucose-Capillary: 113 mg/dL — ABNORMAL HIGH (ref 70–99)
Glucose-Capillary: 89 mg/dL (ref 70–99)
Glucose-Capillary: 107 mg/dL — ABNORMAL HIGH (ref 70–99)

## 2011-09-08 LAB — BASIC METABOLIC PANEL
BUN: 10 mg/dL (ref 6–23)
Calcium: 9 mg/dL (ref 8.4–10.5)
GFR calc Af Amer: 72 mL/min — ABNORMAL LOW (ref >90)
GFR calc non Af Amer: 62 mL/min — ABNORMAL LOW (ref >90)
Glucose, Bld: 110 mg/dL — ABNORMAL HIGH (ref 70–99)

## 2011-09-08 MED ORDER — POTASSIUM CHLORIDE CRYS ER 20 MEQ PO TBCR
40.0000 meq | EXTENDED_RELEASE_TABLET | ORAL | Status: AC
  Administered 2011-09-08 (×2): 40 meq via ORAL
  Filled 2011-09-08 (×2): qty 2

## 2011-09-08 MED ORDER — VERAPAMIL HCL ER 120 MG PO TBCR
120.0000 mg | EXTENDED_RELEASE_TABLET | Freq: Every day | ORAL | Status: DC
  Administered 2011-09-09 – 2011-09-10 (×2): 120 mg via ORAL
  Filled 2011-09-08 (×2): qty 1

## 2011-09-08 NOTE — Progress Notes (Signed)
Physical Therapy Treatment Patient Details Name: Erika Day MRN: [id number] DOB: 11-Feb-1930 Today's Date: 09/08/2011 [id number] Erika Day Ta  PT Assessment/Plan  PT - Assessment/Plan Comments on Treatment Session: Progressing with mobility and tolerance to activity.  Met LTG #2 for supine to sit.  Continues with skilled PT needs.  PT Plan: Discharge plan remains appropriate PT Frequency: Min 3X/week Follow Up Recommendations: Home health PT Equipment Recommended: None recommended by PT PT Goals  Acute Rehab PT Goals PT Goal Formulation: With patient Time For Goal Achievement: 2 weeks Pt will Roll Supine to Right Side: with modified independence;with rail PT Goal: Rolling Supine to Right Side - Progress: Progressing toward goal Pt will Roll Supine to Left Side: with modified independence;with rail PT Goal: Rolling Supine to Left Side - Progress: Progressing toward goal Pt will go Supine/Side to Sit: with min assist PT Goal: Supine/Side to Sit - Progress: Met Pt will Transfer Bed to Chair/Chair to Bed: with supervision (with rolling walker) PT Transfer Goal: Bed to Chair/Chair to Bed - Progress: Progressing toward goal Pt will Ambulate: 51 - 150 feet;with min assist;with rolling walker PT Goal: Ambulate - Progress: Progressing toward goal Pt will Go Up / Down Stairs: 6-9 stairs;with min assist;with rail(s) (7 steps) PT Goal: Up/Down Stairs - Progress: Progressing toward goal  PT Treatment Precautions/Restrictions  Precautions Precautions: Fall Restrictions Weight Bearing Restrictions: No Mobility (including Balance) Bed Mobility Bed Mobility: Yes Supine to Sit: 4: Min assist;With rails;HOB elevated (Comment degrees) Supine to Sit Details (indicate cue type and reason): HOB 50 degrees, min assist for elevating upperbody Sitting - Scoot to Edge of Bed: 5: Supervision Sitting - Scoot to [place] of Bed Details (indicate cue type and reason): increased time, cues for  safety Transfers Transfers: Yes Sit to Stand: 4: Min assist;From elevated surface;From bed;With upper extremity assist;From chair/3-in-1 Sit to Stand Details (indicate cue type and reason): cues for safety Stand to Sit: 4: Min assist;With armrests;To chair/3-in-1;With upper extremity assist Stand to Sit Details: cues for technique Stand Pivot Transfers: 4: Min assist Stand Pivot Transfer Details (indicate cue type and reason): from bed to 3:1 without device Ambulation/Gait Ambulation/Gait: Yes Ambulation/Gait Assistance: 4: Min assist Ambulation/Gait Assistance Details (indicate cue type and reason): forward flexed with cues for posture Ambulation Distance (Feet): 60 Feet Assistive device: Rolling walker Gait Pattern: Decreased stride length;Trunk flexed  Balance Balance Assessed: Yes Static Standing Balance Static Standing - Balance Support: Bilateral upper extremity supported Static Standing - Level of Assistance: 5: Stand by assistance Static Standing - Comment/# of Minutes: approximately 1 mintue for perineal hygiene Exercise    End of Session PT - End of Session Equipment Utilized During Treatment: Gait belt Activity Tolerance: Patient limited by fatigue Patient left: in chair;with call bell in reach General Behavior During Session: [facility] for tasks performed Cognition: [facility] for tasks performed  [facility] 09/08/2011, 2:03 PM

## 2011-09-08 NOTE — Progress Notes (Signed)
Subjective: Pt denies any c/o today. Objective: Vital signs in last 24 hours: Temp:  [98 F (36.7 C)-98.7 F (37.1 C)] 98 F (36.7 C) (11/06 0800) Pulse Rate:  [58-73] 58  (11/06 0800) Resp:  [14-22] 18  (11/06 0800) BP: (139-162)/(54-73) 148/62 mmHg (11/06 0800) SpO2:  [96 %-100 %] 99 % (11/06 0800) Last BM Date: 09/07/11 Intake/Output from previous day: 11/05 0701 - 11/06 0700 In: 1300 [P.O.:400; I.V.:900] Out: 1870 [Urine:1870] Intake/Output this shift: Total I/O In: 225 [I.V.:225] Out: -     General Appearance:    Alert, cooperative, no distress,   Lungs:     Clear to auscultation bilaterally, respirations unlabored   Heart:    Regular rate and rhythm, S1 and S2 normal, no murmur, rub   or gallop  Abdomen:     Soft, non-tender, bowel sounds active all four quadrants,    no masses, no organomegaly  Extremities:   Extremities normal, atraumatic, no cyanosis or edema        Weight change:   Intake/Output Summary (Last 24 hours) at 09/08/11 1048 Last data filed at 09/08/11 1000  Gross per 24 hour  Intake    900 ml  Output   1570 ml  Net   -670 ml    Lab Results:   Basename 09/08/11 0315 09/07/11 0615  NA 136 136  K 3.3* 3.5  CL 102 102  CO2 26 26  GLUCOSE 110* 90  BUN 10 8  CREATININE 0.87 0.98  CALCIUM 9.0 8.7   No results found for this basename: WBC:2,HGB:2,HCT:2,PLT:2,MCV:2 in the last 72 hours PT/INR No results found for this basename: LABPROT:2,INR:2 in the last 72 hours ABG No results found for this basename: PHART:2,PCO2:2,PO2:2,HCO3:2 in the last 72 hours  Micro Results: Recent Results (from the past 240 hour(s))  URINE CULTURE     Status: Normal   Collection Time   09/02/11 12:50 PM      Component Value Range Status Comment   Specimen Description URINE, CLEAN CATCH   Final    Special Requests NONE   Final    Setup Time [id number]   Final    Colony Count >=100,000 COLONIES/ML   Final    Culture PROTEUS MIRABILIS   Final    Report  Status 09/04/2011 FINAL   Final    Organism ID, Bacteria PROTEUS MIRABILIS   Final   MRSA PCR SCREENING     Status: Normal   Collection Time   09/03/11  2:03 AM      Component Value Range Status Comment   MRSA by PCR NEGATIVE  NEGATIVE  Final   CLOSTRIDIUM DIFFICILE BY PCR     Status: Normal   Collection Time   09/03/11  7:45 AM      Component Value Range Status Comment   C difficile by pcr NEGATIVE  NEGATIVE  Final   STOOL CULTURE     Status: Normal   Collection Time   09/03/11  7:35 PM      Component Value Range Status Comment   Specimen Description STOOL   Final    Special Requests IMMUNE:NORM   Final    Culture     Final    Value: NO SALMONELLA, SHIGELLA, CAMPYLOBACTER, OR YERSINIA ISOLATED   Report Status 09/07/2011 FINAL   Final    Studies/Results: No results found.  Scheduled Meds:   . aspirin EC  325 mg Oral Daily  . cefTRIAXone (ROCEPHIN) IVPB 1 gram/50 mL D5W  1 g Intravenous  Q24H  . lisinopril  40 mg Oral Daily  . potassium chloride  40 mEq Oral Q4H  . verapamil  120 mg Oral Daily  . DISCONTD: verapamil  240 mg Oral Daily   Continuous Infusions:   . dextrose 5 % and 0.9% NaCl 75 mL/hr at 09/08/11 0600   PRN Meds:.acetaminophen, HYDROcodone-acetaminophen, LORazepam, ondansetron, ondansetron Assessment/Plan: 1. Hypoglycemia - cbgs stable in low 100s  on d5  but no further hypoglycemic episodes - will decrease dose to kvo and follow 2. UTI, Proteus mirabilis - on abx day 6/7 3. Uncontrolled Dm- continue monitoring cbgs and resume meds other then glipizide when appropriate. Please note the glipizide needs to be dc'ed from her med list when she leaves the hospital.   4.Asymptomatic brady -HR down to 28 last pm while sleeping- will hold verapamil for today, order to resume at decreased dose-120, on 11/7 and monitor  5.HTN-  Monitor on meds adjusted as above 6.hypokalemia - replace k.  awaiting transfer to tele. PT/OT was consulted on 11/3 and are following pt.

## 2011-09-09 LAB — GLUCOSE, CAPILLARY: Glucose-Capillary: 97 mg/dL (ref 70–99)

## 2011-09-09 MED ORDER — POLYETHYLENE GLYCOL 3350 17 G PO PACK
17.0000 g | PACK | Freq: Once | ORAL | Status: AC
  Administered 2011-09-09: 17 g via ORAL
  Filled 2011-09-09: qty 1

## 2011-09-09 MED ORDER — DOCUSATE SODIUM 100 MG PO CAPS
100.0000 mg | ORAL_CAPSULE | Freq: Two times a day (BID) | ORAL | Status: DC
  Administered 2011-09-09 – 2011-09-10 (×2): 100 mg via ORAL
  Filled 2011-09-09 (×3): qty 1

## 2011-09-09 NOTE — Progress Notes (Signed)
Physical Therapy Treatment Patient Details Name: [name] MRN: [id number] DOB: 05/21/1930 Today's Date: 09/09/2011 Time: 4098-1191 Charge: [name] PT Assessment/Plan  PT - Assessment/Plan Comments on Treatment Session: Pt progressing with becoming more independent with transfers and ambulation but continues to require skilled PT. PT Plan: Discharge plan remains appropriate;Frequency remains appropriate PT Goals  Acute Rehab PT Goals PT Goal: Rolling Supine to Left Side - Progress: Progressing toward goal Pt will go Supine/Side to Sit: with modified independence PT Goal: Supine/Side to Sit - Progress: Progressing toward goal PT Goal: Ambulate - Progress: Progressing toward goal  PT Treatment Precautions/Restrictions  Precautions Precautions: Fall Restrictions Weight Bearing Restrictions: No Mobility (including Balance) Bed Mobility Bed Mobility: Yes Supine to Sit: 4: Min assist Supine to Sit Details (indicate cue type and reason): HOB 20*, min/guard for upper body, use of rail and PT hand to pull up upper body Sitting - Scoot to Edge of Bed: 5: Supervision;With rail Sit to Supine - Right: 5: Supervision Transfers Sit to Stand: 4: Min assist;From bed Sit to Stand Details (indicate cue type and reason): assist for weakness, verbal cue for hand placement Stand to Sit: 4: Min assist;To bed;With upper extremity assist Stand to Sit Details: verbal cue for hand placement Ambulation/Gait Ambulation/Gait Assistance: 4: Min assist Ambulation/Gait Assistance Details (indicate cue type and reason): min/guard assist, verbal cues for posture and RW distance; Pt ambulated 40 feet x2 with standing rest break inbetween. Assistive device: Rolling walker Gait Pattern:  (decreased eccentric control end of swing phase)    Exercise    End of Session PT - End of Session Activity Tolerance: Patient limited by fatigue Patient left: in bed;with call bell in reach General Behavior During Session:  [facility] for tasks performed Cognition: [facility] for tasks performed   [facility] 09/09/2011, 3:52 PM

## 2011-09-09 NOTE — Progress Notes (Signed)
Subjective: Patient is mainly concerned about constipation today. She mentions, that she hasn't had a good bowel movement in the last 3-4 days. She denies any pain. She is glad that she hasn't had any more episodes of low blood sugar.  Objective: Vital signs in last 24 hours: Temp:  [98.7 F (37.1 C)-99.9 F (37.7 C)] 98.7 F (37.1 C) (11/07 1407) Pulse Rate:  [68-86] 68  (11/07 1407) Resp:  [18-21] 18  (11/07 1407) BP: (116-194)/(57-85) 116/69 mmHg (11/07 1407) SpO2:  [95 %-100 %] 95 % (11/07 1407) Weight change:  Last BM Date: 09/08/11  Intake/Output from previous day: 11/06 0701 - 11/07 0700 In: 1284.3 [P.O.:720; I.V.:424.3; IV Piggyback:100] Out: 1795 [Urine:1795] Intake/Output this shift: Total I/O In: 480 [P.O.:480] Out: -   Physical Exam  Constitutional: She appears well-developed and well-nourished. No distress.  HENT:  Head: Normocephalic and atraumatic.  Mouth/Throat: Oropharynx is clear and moist. No oropharyngeal exudate.  Eyes: EOM are normal. Pupils are equal, round, and reactive to light. No scleral icterus.  Neck: Normal range of motion. Neck supple. No tracheal deviation present. No thyromegaly present.  Cardiovascular: Normal rate and regular rhythm.   No murmur heard. Pulmonary/Chest: Effort normal and breath sounds normal. No respiratory distress. She has no wheezes. She has no rales.  Abdominal: Soft. Bowel sounds are normal. She exhibits no distension and no mass. There is no tenderness. There is no rebound and no guarding.  Musculoskeletal: Normal range of motion. She exhibits no edema.  Lymphadenopathy:    She has no cervical adenopathy.  Neurological: She is alert. No cranial nerve deficit.  Skin: Skin is warm and dry.  Psychiatric: She has a normal mood and affect.    Lab Results: No results found for this basename: WBC:2,HGB:2,HCT:2,PLT:2 in the last 72 hours BMET  Basename 09/08/11 0315 09/07/11 0615  NA 136 136  K 3.3* 3.5  CL 102 102    CO2 26 26  GLUCOSE 110* 90  BUN 10 8  CREATININE 0.87 0.98  CALCIUM 9.0 8.7    Results for orders placed during the hospital encounter of 09/02/11 (from the past 24 hour(s))  GLUCOSE, CAPILLARY     Status: Abnormal   Collection Time   09/08/11  4:04 PM      Component Value Range   Glucose-Capillary 145 (*) 70 - 99 (mg/dL)   Comment 1 Documented in Chart     Comment 2 Notify RN    GLUCOSE, CAPILLARY     Status: Normal   Collection Time   09/08/11  6:25 PM      Component Value Range   Glucose-Capillary 89  70 - 99 (mg/dL)   Comment 1 Documented in Chart     Comment 2 Notify RN    GLUCOSE, CAPILLARY     Status: Abnormal   Collection Time   09/08/11  8:03 PM      Component Value Range   Glucose-Capillary 107 (*) 70 - 99 (mg/dL)   Comment 1 Documented in Chart     Comment 2 Notify RN    GLUCOSE, CAPILLARY     Status: Normal   Collection Time   09/09/11 12:06 AM      Component Value Range   Glucose-Capillary 97  70 - 99 (mg/dL)   Comment 1 Notify RN    GLUCOSE, CAPILLARY     Status: Normal   Collection Time   09/09/11  5:22 AM      Component Value Range   Glucose-Capillary 84  70 - 99 (mg/dL)  GLUCOSE, CAPILLARY     Status: Normal   Collection Time   09/09/11  8:01 AM      Component Value Range   Glucose-Capillary 80  70 - 99 (mg/dL)  GLUCOSE, CAPILLARY     Status: Abnormal   Collection Time   09/09/11 12:10 PM      Component Value Range   Glucose-Capillary 153 (*) 70 - 99 (mg/dL)    Studies/Results: No results found.  Medications:  Scheduled:   . aspirin EC  325 mg Oral Daily  . cefTRIAXone (ROCEPHIN) IVPB 1 gram/50 mL D5W  1 g Intravenous Q24H  . docusate sodium  100 mg Oral BID  . lisinopril  40 mg Oral Daily  . polyethylene glycol  17 g Oral Once  . verapamil  120 mg Oral Daily    Active Problems:  Hypoglycemia  HTN (hypertension), benign  DM (diabetes mellitus), type 2, uncontrolled  UTI (lower urinary tract infection)   Assessment/Plan: 1.  Hypoglycemia - cbgs stable now. Stop IVF.  2. Constipation: Colace and miralax. 2. UTI, Proteus mirabilis - on abx day 7/7. 3. Uncontrolled Dm- continue monitoring cbgs and resume meds other then glipizide when appropriate. Please note the glipizide needs to be dc'ed from her med list when she leaves the hospital. F/u with PCP and have HBA1c checked if not done recently. 4. Asymptomatic brady: no further episodes. Monitor. 5. HTN- Monitor. 6. hypokalemia - replace k.  Possible dc home with home health in AM.   LOS: 7 days   Jabaree Mercado 09/09/2011, 3:02 PM

## 2011-09-09 NOTE — Progress Notes (Signed)
[facility] following for HH-RN/PT/OT.

## 2011-09-09 NOTE — Progress Notes (Signed)
Spoke to patient/dtr Erika Day about d/c plans.[facility] following for [facility].Family will asst @ home.

## 2011-09-09 NOTE — Progress Notes (Signed)
Occupational Therapy Evaluation Patient Details Name: NAYDA RIESEN MRN: [id number] DOB: 01-04-1930 Today's Date: 09/09/2011  OT Assessment/Plan   OT Goals Acute Rehab OT Goals OT Goal Formulation: With patient Time For Goal Achievement: 2 weeks ADL Goals Pt Will Perform Grooming: with supervision;Standing at sink;Other (comment) (3 TASKS) ADL Goal: Grooming - Progress: Progressing toward goals Pt Will Perform Lower Body Bathing: with supervision;Sit to stand from chair ADL Goal: Lower Body Bathing - Progress: Progressing toward goals Pt Will Perform Lower Body Dressing: with supervision;Sit to stand from chair ADL Goal: Lower Body Dressing - Progress: Progressing toward goals Pt Will Transfer to Toilet: with supervision;3-in-1;Ambulation ADL Goal: Toilet Transfer - Progress: Progressing toward goals  OT Treatment Precautions/Restrictions  Precautions Precautions: Fall Restrictions Weight Bearing Restrictions: No   ADL ADL Toilet Transfer: Minimal assistance Toilet Transfer Details (indicate cue type and reason): 1 VC TO SAFELY COMPLETE TURN TO 3:1 Toilet Transfer Method: Proofreader: Set designer - Hygiene: Performed;+1 Total assistance;Other (comment) (FATIQUED ASKED FOR ASSISTANCE) Mobility  Bed Mobility Bed Mobility: Yes Supine to Sit: 4: Min assist Transfers Sit to Stand: 4: Min assist Exercises    End of Session OT - End of Session Equipment Utilized During Treatment: Gait belt General Behavior During Session: [facility] for tasks performed Cognition: [facility] for tasks performed (1 VC FOR SAFETY) [place] Coma Recovery Scale Auditory:  (1 )  Averyanna Sax  09/09/2011, 3:24 PM

## 2011-09-10 LAB — BASIC METABOLIC PANEL
BUN: 9 mg/dL (ref 6–23)
CO2: 24 mEq/L (ref 19–32)
Chloride: 104 mEq/L (ref 96–112)
GFR calc Af Amer: 65 mL/min — ABNORMAL LOW (ref >90)
Glucose, Bld: 80 mg/dL (ref 70–99)
Potassium: 4.1 mEq/L (ref 3.5–5.1)

## 2011-09-10 LAB — GLUCOSE, CAPILLARY
Glucose-Capillary: 78 mg/dL (ref 70–99)
Glucose-Capillary: 77 mg/dL (ref 70–99)

## 2011-09-10 MED ORDER — VERAPAMIL HCL 120 MG PO TBCR: 120.0000 mg | EXTENDED_RELEASE_TABLET | Freq: Every day | ORAL | Status: DC

## 2011-09-10 NOTE — Plan of Care (Signed)
Problem: Phase III Progression Outcomes Goal: Discharge plan remains appropriate-arrangements made Outcome: Completed/Met Date Met:  09/10/11 Home with Family support

## 2011-09-10 NOTE — Discharge Summary (Signed)
Physician Discharge Summary  Patient ID: Erika Day MRN: [id number] DOB/AGE: December 13, 1929 77 y.o.  Admit date: 09/02/2011 Discharge date: 09/10/2011  Admission Diagnoses: Hypoglycemia. UTI.  Discharge Diagnoses:  Active Problems:  HTN (hypertension), benign  DM (diabetes mellitus), type 2, uncontrolled   Discharged Condition: fair  Hospital Course: This is a 77 year old, African American female, presented to the hospital with complaints of dizziness and low blood sugars at home. Patient was discharged from [facility], just a few days prior to this admission. She underwent surgery for ovarian cancer. Patient does have a history of diabetes, and is on oral hypoglycemic agents at home. During this hospitalization she was found to have hypoglycemia. She required a dextrose infusion. Her oral hypoglycemic agent was discontinued. Subsequently, the IV fluids were tapered off. Patient started to eat properly. And her blood sugars have come up now. However, they're still hanging on the 100-110 range. At this time. I would recommend to the patient not to take her glipizide anymore. Would recommend followup with her primary care physician next week. Home health RN will be asked to see the patient. So, they can check her blood sugars at home, to make, sure it's not climbing up. The exact reason for her hypoglycemia is not entirely clear. It may have to do something with her recent hospitalization at [facility]. May also have something to do with the fact, that she wasn't eating much after coming back from the hospital and was continuing to take her glipizide.  She was also found to have a urinary tract infection during this hospitalization. Patient was put on ceftriaxone. The organism that grew out for was a Proteus mirabilis. She's completed 7 days of IV antibiotics and will not longer than anything more treatment.   Initially, she also presented with diarrhea, which has resolved. Then she  became constipated. She was given MiraLAX and stool softeners and she is much better. C. difficile was checked and was negative.  There was also concern about the heart rate during this hospitalization. She was noted to become very bradycardic, especially during the nighttime. Her heart rate would drop into the 30s and 40s. The dose of her verapamil was decreased. Heart rate subsequently has improved.   Rest of her medical issues are stable.  She was seen by physical therapy and occupational therapy in the hospital. And they do recommend home health PT and OT. This will be arranged for the patient.  All of the above was discussed with the patient's husband and her granddaughter and they are in agreement with the plan.  Consults: none  Significant Diagnostic Studies:  urine culture: positive for proteus  Treatments: IV hydration and antibiotics: ceftriaxone  Discharge Exam: Blood pressure 146/65, pulse 62, temperature 98.8 F (37.1 C), temperature source Oral, resp. rate 16, height 4' 11.8" (1.519 m), weight 88.5 kg (195 lb 1.7 oz), SpO2 96.00%. General appearance: alert, cooperative, appears stated age and no distress Throat: lips, mucosa, and tongue normal; teeth and gums normal Neck: no adenopathy, no carotid bruit, no JVD, supple, symmetrical, trachea midline and thyroid not enlarged, symmetric, no tenderness/mass/nodules Resp: clear to auscultation bilaterally Cardio: regular rate and rhythm, S1, S2 normal, no murmur, click, rub or gallop GI: soft, non-tender; bowel sounds normal; no masses,  no organomegaly  The incision site looked clean without any erythema or tenderness.   Discharge Orders    Future Orders Please Complete By Expires   Diet - low sodium heart healthy  Increase activity slowly      Discharge instructions      Comments:   Please follow up with your PCP within a week. STOP TAKING THE GLIPIZIDE. PLEASE NOTE THE DOSE OF VERAPAMIL HAS BEEN CHANGED      Current Discharge Medication List    CONTINUE these medications which have CHANGED   Details  verapamil (CALAN-SR) 120 MG CR tablet Take 1 tablet (120 mg total) by mouth daily. Qty: 30 tablet, Refills: 2      CONTINUE these medications which have NOT CHANGED   Details  aspirin EC 325 MG tablet Take 325 mg by mouth daily.      lisinopril (PRINIVIL,ZESTRIL) 40 MG tablet Take 40 mg by mouth daily.      LORazepam (ATIVAN) 1 MG tablet Take 0.5 mg by mouth at bedtime. As needed for anxiety       STOP taking these medications     glipiZIDE (GLUCOTROL XL) 10 MG 24 hr tablet        Follow-up Information    Make an appointment with Billee Cashing.   Contact information:   [address] [employer] [place] [address] [phone]          Signed: Osvaldo Shipper 09/10/2011, 12:20 PM

## 2014-04-23 ENCOUNTER — Encounter (HOSPITAL_COMMUNITY): Payer: Self-pay | Admitting: Emergency Medicine

## 2014-04-23 ENCOUNTER — Inpatient Hospital Stay (HOSPITAL_COMMUNITY): Payer: PRIVATE HEALTH INSURANCE

## 2014-04-23 ENCOUNTER — Inpatient Hospital Stay (HOSPITAL_COMMUNITY)
Admission: EM | Admit: 2014-04-23 | Discharge: 2014-05-08 | DRG: 331 | Disposition: A | Discharge status: Home Health | Payer: PRIVATE HEALTH INSURANCE | Attending: Internal Medicine | Admitting: Internal Medicine

## 2014-04-23 ENCOUNTER — Emergency Department (HOSPITAL_COMMUNITY): Payer: PRIVATE HEALTH INSURANCE

## 2014-04-23 DIAGNOSIS — K219 Gastro-esophageal reflux disease without esophagitis: Secondary | ICD-10-CM | POA: Diagnosis present

## 2014-04-23 DIAGNOSIS — K565 Intestinal adhesions [bands], unspecified as to partial versus complete obstruction: Secondary | ICD-10-CM | POA: Diagnosis present

## 2014-04-23 DIAGNOSIS — IMO0002 Reserved for concepts with insufficient information to code with codable children: Secondary | ICD-10-CM

## 2014-04-23 DIAGNOSIS — M1990 Unspecified osteoarthritis, unspecified site: Secondary | ICD-10-CM | POA: Diagnosis present

## 2014-04-23 DIAGNOSIS — F411 Generalized anxiety disorder: Secondary | ICD-10-CM | POA: Diagnosis present

## 2014-04-23 DIAGNOSIS — R7989 Other specified abnormal findings of blood chemistry: Secondary | ICD-10-CM | POA: Diagnosis present

## 2014-04-23 DIAGNOSIS — R109 Unspecified abdominal pain: Secondary | ICD-10-CM

## 2014-04-23 DIAGNOSIS — I959 Hypotension, unspecified: Secondary | ICD-10-CM | POA: Diagnosis not present

## 2014-04-23 DIAGNOSIS — I10 Essential (primary) hypertension: Secondary | ICD-10-CM | POA: Diagnosis present

## 2014-04-23 DIAGNOSIS — K560 Paralytic ileus: Secondary | ICD-10-CM | POA: Diagnosis not present

## 2014-04-23 DIAGNOSIS — K56609 Unspecified intestinal obstruction, unspecified as to partial versus complete obstruction: Secondary | ICD-10-CM | POA: Diagnosis present

## 2014-04-23 DIAGNOSIS — M5137 Other intervertebral disc degeneration, lumbosacral region: Secondary | ICD-10-CM | POA: Diagnosis present

## 2014-04-23 DIAGNOSIS — Z888 Allergy status to other drugs, medicaments and biological substances status: Secondary | ICD-10-CM

## 2014-04-23 DIAGNOSIS — C569 Malignant neoplasm of unspecified ovary: Secondary | ICD-10-CM

## 2014-04-23 DIAGNOSIS — R1110 Vomiting, unspecified: Secondary | ICD-10-CM

## 2014-04-23 DIAGNOSIS — M51379 Other intervertebral disc degeneration, lumbosacral region without mention of lumbar back pain or lower extremity pain: Secondary | ICD-10-CM | POA: Diagnosis present

## 2014-04-23 DIAGNOSIS — K439 Ventral hernia without obstruction or gangrene: Secondary | ICD-10-CM | POA: Diagnosis present

## 2014-04-23 DIAGNOSIS — F410 Panic disorder [episodic paroxysmal anxiety] without agoraphobia: Secondary | ICD-10-CM | POA: Diagnosis present

## 2014-04-23 DIAGNOSIS — Z8673 Personal history of transient ischemic attack (TIA), and cerebral infarction without residual deficits: Secondary | ICD-10-CM | POA: Diagnosis not present

## 2014-04-23 DIAGNOSIS — Z87891 Personal history of nicotine dependence: Secondary | ICD-10-CM

## 2014-04-23 DIAGNOSIS — E1165 Type 2 diabetes mellitus with hyperglycemia: Secondary | ICD-10-CM | POA: Diagnosis present

## 2014-04-23 DIAGNOSIS — IMO0001 Reserved for inherently not codable concepts without codable children: Secondary | ICD-10-CM

## 2014-04-23 DIAGNOSIS — E876 Hypokalemia: Secondary | ICD-10-CM | POA: Diagnosis not present

## 2014-04-23 DIAGNOSIS — Z993 Dependence on wheelchair: Secondary | ICD-10-CM

## 2014-04-23 DIAGNOSIS — R197 Diarrhea, unspecified: Secondary | ICD-10-CM

## 2014-04-23 DIAGNOSIS — E785 Hyperlipidemia, unspecified: Secondary | ICD-10-CM | POA: Diagnosis present

## 2014-04-23 HISTORY — DX: Essential (primary) hypertension: I10

## 2014-04-23 HISTORY — DX: Panic disorder (episodic paroxysmal anxiety): F41.0

## 2014-04-23 HISTORY — DX: Other intervertebral disc degeneration, lumbar region without mention of lumbar back pain or lower extremity pain: M51.369

## 2014-04-23 HISTORY — DX: Other intervertebral disc degeneration, lumbar region: M51.36

## 2014-04-23 HISTORY — DX: Hyperlipidemia, unspecified: E78.5

## 2014-04-23 HISTORY — DX: Unspecified osteoarthritis, unspecified site: M19.90

## 2014-04-23 HISTORY — DX: Gastro-esophageal reflux disease without esophagitis: K21.9

## 2014-04-23 HISTORY — DX: Cerebral infarction, unspecified: I63.9

## 2014-04-23 LAB — URINALYSIS, ROUTINE W REFLEX MICROSCOPIC
BILIRUBIN URINE: NEGATIVE
Glucose, UA: NEGATIVE mg/dL
HGB URINE DIPSTICK: NEGATIVE
KETONES UR: NEGATIVE mg/dL
Leukocytes, UA: NEGATIVE
NITRITE: NEGATIVE
PROTEIN: NEGATIVE mg/dL
SPECIFIC GRAVITY, URINE: 1.017 (ref 1.005–1.030)
UROBILINOGEN UA: 1 mg/dL (ref 0.0–1.0)
pH: 6 (ref 5.0–8.0)

## 2014-04-23 LAB — CBC WITH DIFFERENTIAL/PLATELET
Basophils Absolute: 0 10*3/uL (ref 0.0–0.1)
Basophils Relative: 0 % (ref 0–1)
EOS ABS: 0 10*3/uL (ref 0.0–0.7)
Eosinophils Relative: 0 % (ref 0–5)
HCT: 37.5 % (ref 36.0–46.0)
HEMOGLOBIN: 12.4 g/dL (ref 12.0–15.0)
LYMPHS ABS: 3.9 10*3/uL (ref 0.7–4.0)
LYMPHS PCT: 51 % — AB (ref 12–46)
MCH: 28.1 pg (ref 26.0–34.0)
MCHC: 33.1 g/dL (ref 30.0–36.0)
MCV: 85 fL (ref 78.0–100.0)
MONOS PCT: 0 % — AB (ref 3–12)
Monocytes Absolute: 0 10*3/uL — ABNORMAL LOW (ref 0.1–1.0)
NEUTROS PCT: 49 % (ref 43–77)
Neutro Abs: 3.7 10*3/uL (ref 1.7–7.7)
PLATELETS: 160 10*3/uL (ref 150–400)
RBC: 4.41 MIL/uL (ref 3.87–5.11)
RDW: 13.2 % (ref 11.5–15.5)
WBC: 7.7 10*3/uL (ref 4.0–10.5)

## 2014-04-23 LAB — COMPREHENSIVE METABOLIC PANEL
ALK PHOS: 77 U/L (ref 39–117)
ALT: 6 U/L (ref 0–35)
AST: 13 U/L (ref 0–37)
Albumin: 3.7 g/dL (ref 3.5–5.2)
BILIRUBIN TOTAL: 0.3 mg/dL (ref 0.3–1.2)
BUN: 11 mg/dL (ref 6–23)
CHLORIDE: 101 meq/L (ref 96–112)
CO2: 26 meq/L (ref 19–32)
Calcium: 10.5 mg/dL (ref 8.4–10.5)
Creatinine, Ser: 1.04 mg/dL (ref 0.50–1.10)
GFR, EST AFRICAN AMERICAN: 56 mL/min — AB (ref >90)
GFR, EST NON AFRICAN AMERICAN: 49 mL/min — AB (ref >90)
GLUCOSE: 152 mg/dL — AB (ref 70–99)
POTASSIUM: 5 meq/L (ref 3.7–5.3)
SODIUM: 139 meq/L (ref 137–147)
Total Protein: 7.2 g/dL (ref 6.0–8.3)

## 2014-04-23 LAB — LIPASE, BLOOD: LIPASE: 34 U/L (ref 11–59)

## 2014-04-23 LAB — I-STAT CG4 LACTIC ACID, ED: Lactic Acid, Venous: 1.38 mmol/L (ref 0.5–2.2)

## 2014-04-23 MED ORDER — LIDOCAINE VISCOUS 2 % MT SOLN
15.0000 mL | Freq: Once | OROMUCOSAL | Status: AC
  Administered 2014-04-23: 15 mL via OROMUCOSAL
  Filled 2014-04-23: qty 15

## 2014-04-23 MED ORDER — SODIUM CHLORIDE 0.9 % IV BOLUS (SEPSIS)
1000.0000 mL | INTRAVENOUS | Status: AC
  Administered 2014-04-23: 1000 mL via INTRAVENOUS

## 2014-04-23 MED ORDER — ONDANSETRON HCL 4 MG/2ML IJ SOLN
4.0000 mg | Freq: Once | INTRAMUSCULAR | Status: AC
  Administered 2014-04-23: 4 mg via INTRAVENOUS
  Filled 2014-04-23: qty 2

## 2014-04-23 MED ORDER — MORPHINE SULFATE 2 MG/ML IJ SOLN: 1.0000 mg | INTRAMUSCULAR | Status: DC | PRN

## 2014-04-23 MED ORDER — ALUM & MAG HYDROXIDE-SIMETH 200-200-20 MG/5ML PO SUSP: 30.0000 mL | Freq: Four times a day (QID) | ORAL | Status: DC | PRN

## 2014-04-23 MED ORDER — IOHEXOL 300 MG/ML  SOLN
25.0000 mL | Freq: Once | INTRAMUSCULAR | Status: AC | PRN
  Administered 2014-04-23: 25 mL via ORAL

## 2014-04-23 MED ORDER — ACETAMINOPHEN 325 MG PO TABS
650.0000 mg | ORAL_TABLET | Freq: Once | ORAL | Status: AC
Start: 2014-04-23 — End: 2014-04-23
  Administered 2014-04-23: 650 mg via ORAL
  Filled 2014-04-23: qty 2

## 2014-04-23 MED ORDER — HEPARIN SODIUM (PORCINE) 5000 UNIT/ML IJ SOLN
5000.0000 [IU] | Freq: Three times a day (TID) | INTRAMUSCULAR | Status: DC
  Filled 2014-04-23 (×2): qty 1

## 2014-04-23 MED ORDER — SODIUM CHLORIDE 0.9 % IV SOLN
INTRAVENOUS | Status: DC
  Administered 2014-04-23 – 2014-04-25 (×3): via INTRAVENOUS

## 2014-04-23 MED ORDER — ACETAMINOPHEN 325 MG PO TABS
650.0000 mg | ORAL_TABLET | Freq: Four times a day (QID) | ORAL | Status: DC | PRN
  Administered 2014-05-01: 650 mg via ORAL
  Filled 2014-04-23: qty 2

## 2014-04-23 MED ORDER — LISINOPRIL 10 MG PO TABS
10.0000 mg | ORAL_TABLET | Freq: Every day | ORAL | Status: DC
  Filled 2014-04-23 (×2): qty 1

## 2014-04-23 MED ORDER — ONDANSETRON HCL 4 MG/2ML IJ SOLN
4.0000 mg | Freq: Four times a day (QID) | INTRAMUSCULAR | Status: DC | PRN
  Administered 2014-05-07: 4 mg via INTRAVENOUS
  Filled 2014-04-23: qty 2

## 2014-04-23 MED ORDER — ONDANSETRON HCL 4 MG PO TABS: 4.0000 mg | ORAL_TABLET | Freq: Four times a day (QID) | ORAL | Status: DC | PRN

## 2014-04-23 MED ORDER — TRAMADOL HCL 50 MG PO TABS: 50.0000 mg | ORAL_TABLET | Freq: Once | ORAL | Status: DC

## 2014-04-23 MED ORDER — IOHEXOL 300 MG/ML  SOLN
100.0000 mL | Freq: Once | INTRAMUSCULAR | Status: AC | PRN
  Administered 2014-04-23: 100 mL via INTRAVENOUS

## 2014-04-23 MED ORDER — VERAPAMIL HCL ER 120 MG PO TBCR
120.0000 mg | EXTENDED_RELEASE_TABLET | Freq: Every day | ORAL | Status: DC
  Filled 2014-04-23 (×3): qty 1

## 2014-04-23 MED ORDER — LORAZEPAM 0.5 MG PO TABS: 0.5000 mg | ORAL_TABLET | Freq: Every evening | ORAL | Status: DC | PRN

## 2014-04-23 MED ORDER — SODIUM CHLORIDE 0.9 % IV BOLUS (SEPSIS)
500.0000 mL | Freq: Once | INTRAVENOUS | Status: AC
  Administered 2014-04-23: 500 mL via INTRAVENOUS

## 2014-04-23 MED ORDER — ACETAMINOPHEN 650 MG RE SUPP: 650.0000 mg | Freq: Four times a day (QID) | RECTAL | Status: DC | PRN

## 2014-04-23 NOTE — H&P (Signed)
[facility] History and Physical  Erika Day QMV:[id number] DOB: 01-22-1930 DOA: 04/23/2014  Referring physician:  PCP: Philis Fendt, MD   Chief Complaint: Abdominal pain/nausea and vomiting  HPI: Erika Day is a 80 y.o. female with a past medical history of CVA, type 2 diabetes, hypertension, dyslipidemia, history of ovarian mass, small bowel obstruction, status post exploratory laparotomy in 2012 performed at [facility], poor functional status, presenting to the emergency department with complaints of abdominal pain, nausea and vomiting. She reports having intermittent abdominal pain over the past month however symptoms have significantly worsened in the past 3-4 days. She describes multiple episodes of nonbloody emesis worse in the last 24 hours as well as diarrhea characterized as nonbloody as well. She complains of generalized abdominal pain with associated abdominal distention, though not quite as bad during my encounter. Initial workup in the emergency room included a CT scan of abdomen and pelvis which showed a high-grade small bowel obstruction. She was seen and evaluated by general surgery in the emergency room. Did not feel she required surgical intervention at this time recommending conservative management. An NG tube for decompression, bowel rest, and monitoring of electrolytes were recommended as well.                                                                 Review of Systems:  Constitutional:  No weight loss, night sweats, Fevers, positive for chills and fatigue.  HEENT:  No headaches, Difficulty swallowing,Tooth/dental problems,Sore throat,  No sneezing, itching, ear ache, nasal congestion, post nasal drip,  Cardio-vascular:  No chest pain, Orthopnea, PND, swelling in lower extremities, anasarca, dizziness, palpitations  GI:  No heartburn, indigestion, positive for abdominal pain, nausea, vomiting, diarrhea, change in bowel habits, loss of appetite  Resp:  No  shortness of breath with exertion or at rest. No excess mucus, no productive cough, No non-productive cough, No coughing up of blood.No change in color of mucus.No wheezing.No chest wall deformity  Skin:  no rash or lesions.  GU:  no dysuria, change in color of urine, no urgency or frequency. No flank pain.  Musculoskeletal:  No joint pain or swelling. No decreased range of motion. No back pain.  Psych:  No change in mood or affect. No depression or anxiety. No memory loss.   Past Medical History  Diagnosis Date  . Stroke   . Acid reflux disease   . Panic attacks   . Hypertension   . DDD (degenerative disc disease), lumbar   . Dyslipidemia   . Degenerative joint disease    Past Surgical History  Procedure Laterality Date  . Cholecystectomy    . Abdominal hysterectomy     Social History:  reports that she has quit smoking. She does not have any smokeless tobacco history on file. She reports that she does not drink alcohol or use illicit drugs.  Allergies  Allergen Reactions  . Morphine And Related Other (See Comments)    Blood sugar dropped     No family history on file.   Prior to Admission medications   Medication Sig Start Date End Date Taking? Authorizing Provider  aspirin EC 325 MG tablet Take 325 mg by mouth daily.     Yes Historical Provider, MD  lansoprazole (PREVACID) 15  MG capsule Take 15 mg by mouth daily at 12 noon.   Yes Historical Provider, MD  lisinopril (PRINIVIL,ZESTRIL) 10 MG tablet Take 10 mg by mouth daily.   Yes Historical Provider, MD  LORazepam (ATIVAN) 1 MG tablet Take 0.5 mg by mouth at bedtime as needed for anxiety. As needed for anxiety   Yes Historical Provider, MD  oxymetazoline (AFRIN) 0.05 % nasal spray Place 1-2 sprays into both nostrils 2 (two) times daily as needed for congestion.   Yes Historical Provider, MD  verapamil (CALAN-SR) 120 MG CR tablet Take 1 tablet (120 mg total) by mouth daily. 09/10/11 04/23/14 Yes Bonnielee Haff, MD    Physical Exam: Filed Vitals:   04/23/14 1415  BP: 170/69  Pulse: 66  Temp:   Resp: 16    BP 170/69  Pulse 66  Temp(Src) 97.8 F (36.6 C) (Oral)  Resp 16  SpO2 96%  General:  Appears calm and comfortable, in no acute distress. She is alert and oriented, mentating well  Eyes: PERRL, normal lids, irises & conjunctiva ENT: grossly normal hearing, lips & tongue, dry oral mucosa Neck: no LAD, masses or thyromegaly Cardiovascular: RRR, no m/r/g. No LE edema. Telemetry: SR, no arrhythmias  Respiratory: CTA bilaterally, no w/r/r. Normal respiratory effort. Abdomen: Overall she had a benign abdominal exam, periumbilical hernia was present, her abdomen was soft, nontender to palpation, nondistended, with positive bowel sounds Skin: no rash or induration seen on limited exam Musculoskeletal: grossly normal tone BUE/BLE Psychiatric: grossly normal mood and affect, speech fluent and appropriate Neurologic: grossly non-focal.          Labs on Admission:  Basic Metabolic Panel:  Recent Labs Lab 04/23/14 1052  NA 139  K 5.0  CL 101  CO2 26  GLUCOSE 152*  BUN 11  CREATININE 1.04  CALCIUM 10.5   Liver Function Tests:  Recent Labs Lab 04/23/14 1052  AST 13  ALT 6  ALKPHOS 77  BILITOT 0.3  PROT 7.2  ALBUMIN 3.7    Recent Labs Lab 04/23/14 1052  LIPASE 34   No results found for this basename: AMMONIA,  in the last 168 hours CBC:  Recent Labs Lab 04/23/14 1052  WBC 7.7  NEUTROABS 3.7  HGB 12.4  HCT 37.5  MCV 85.0  PLT 160   Cardiac Enzymes: No results found for this basename: CKTOTAL, CKMB, CKMBINDEX, TROPONINI,  in the last 168 hours  BNP (last 3 results) No results found for this basename: PROBNP,  in the last 8760 hours CBG: No results found for this basename: GLUCAP,  in the last 168 hours  Radiological Exams on Admission: Ct Abdomen Pelvis W Contrast  04/23/2014   CLINICAL DATA:  Epigastric pain and vomiting. History of cholecystectomy.  EXAM:  CT ABDOMEN AND PELVIS WITH CONTRAST  TECHNIQUE: Multidetector CT imaging of the abdomen and pelvis was performed using the standard protocol following bolus administration of intravenous contrast.  CONTRAST:  181mL OMNIPAQUE IOHEXOL 300 MG/ML  SOLN  COMPARISON:  08/17/2011  FINDINGS: Subsegmental atelectasis is present in the lung bases. 3 vessel coronary artery calcifications are noted.  The gallbladder is surgically absent. The liver, spleen, adrenal glands, kidneys, and pancreas have an unremarkable enhanced appearance. Mild dilatation of the common bile duct is unchanged and likely related to prior cholecystectomy.  There is new, moderate volume ascites. There is moderate dilatation of multiple fluid-filled small bowel loops measuring up to approximately 4 cm in diameter. A small amount of pneumatosis is questioned in a  small bowel loop in the right mid abdomen (series 2, images 37 and 39). The distal ileum and colon are largely decompressed. There is a small bowel containing ventral hernia, although contrast does pass beyond this, and the level of apparent small bowel obstruction is quite distal to this.  Sequelae of interval hysterectomy are identified. There is a partially calcified 4.8 x 3.9 cm soft tissue mass in the right hemipelvis. Cystic adnexal mass has been resected. There is soft tissue fullness in the peritoneum, stable to slightly more prominent than on the prior study. Right inguinal lymph nodes measure up to 1.3 cm in short axis, mildly increased in size compared to the prior study. Bladder is partially decompressed with possible mild bladder wall thickening. Advanced atherosclerotic calcification is noted of the abdominal aorta and common iliac arteries. Sclerosis about the right greater than left sacroiliac joints is unchanged. Advanced disc degeneration is seen throughout the lower thoracic and lumbar spine.  IMPRESSION: 1. High-grade small bowel obstruction with transition to decompressed  distal small bowel in the right mid abdomen. Early pneumatosis is questioned in a small bowel loop in the right mid abdomen. 2. Interval hysterectomy with resection of the cystic mass described on the prior CT. Partially calcified soft tissue nodule is present in the posterior right pelvis, and this is concerning for a metastatic deposit. 3. Mild interval enlargement of right inguinal lymph nodes, nonspecific however nodal metastatic disease is possible. 4. Soft tissue fullness in the perineum, stable to slightly more prominent than on the prior study. This may reflect pelvic floor laxity, although clinical correlation is recommended to excluded metastatic disease in the low pelvis.   Electronically Signed   By: Logan Bores   On: 04/23/2014 12:50    EKG: Independently reviewed.   Assessment/Plan Principal Problem:   SBO (small bowel obstruction) Active Problems:   HTN (hypertension), benign   DM (diabetes mellitus), type 2, uncontrolled   Abdominal pain, unspecified site   1. Small bowel obstruction. Patient presenting with intermittent abdominal pain over the past month becoming worse the last 3-4 days. She had a CT scan of abdomen and pelvis in the emergency room that showed small bowel obstruction. Clinically however she does not appear to be as ill, having a benign abdominal exam, with lab showed a normal white count and a lactate of 1.38. Surgery consulted, recommending NG tube for decompression, bowel rest, follow labs with repeat x-ray in a.m. 2. Hypertension. Patient having systolic blood pressures in the 140s to 160s, will continue lisinopril 10 mg by mouth daily and verapamil 120 mg by mouth daily 3. Type 2 diabetes mellitus. Perform Accu-Cheks every 4, with sliding scale coverage 4. DVT prophylaxis. Subcutaneous heparin  Code Status: Full Code Family Communication: Spoke with family members at bedside Disposition Plan: Will admit to inpatient service, do not anticipate her requiring  greater than 2 nights hospitalization  Time spent: 60 min  Kelvin Cellar [facility] Pager [phone]  **Disclaimer: This note may have been dictated with voice recognition software. Similar sounding words can inadvertently be transcribed and this note may contain transcription errors which may not have been corrected upon publication of note.**

## 2014-04-23 NOTE — ED Notes (Signed)
MD at bedside. (Dr. Harrison) 

## 2014-04-23 NOTE — Progress Notes (Signed)
Admission note:  Arrival Method: stretcher Mental Orientation: alert and oriented x 4  Telemetry: N/A Assessment: completed Skin: no open areas; skin dry and flaky  IV: left AC; saline locked  Pain: pt reports no pain at this time  Tubes: N/A  Safety Measures: Patient Handbook has been given, and discussed the Fall Prevention worksheet. Left at bedside  Admission: Paged Dr. Coralyn Pear to inform him of pt's arrival to the unit  6E Orientation: Patient has been oriented to the unit, staff and to the room.  Family: At the bedside

## 2014-04-23 NOTE — ED Provider Notes (Signed)
CSN: [id number]     Arrival date & time    History   First MD Initiated Contact with Patient 04/23/14 1031     Chief Complaint  Patient presents with  . Nausea  . Emesis  . Diarrhea     (Consider location/radiation/quality/duration/timing/severity/associated sxs/prior Treatment) Patient is a 80 y.o. female presenting with vomiting and diarrhea. The history is provided by the patient.  Emesis Severity:  Mild Duration:  4 days Timing:  Intermittent Progression:  Worsening Chronicity:  New Relieved by:  Nothing Worsened by:  Nothing tried Ineffective treatments:  None tried Associated symptoms: abdominal pain and diarrhea   Associated symptoms: no headaches   Abdominal pain:    Location:  Epigastric   Quality:  Unable to specify   Severity:  Mild   Onset quality:  Gradual   Duration:  4 days   Timing:  Constant   Progression:  Unchanged   Chronicity:  New Diarrhea Associated symptoms: abdominal pain and vomiting   Associated symptoms: no fever and no headaches     Past Medical History  Diagnosis Date  . Stroke   . Acid reflux disease   . Panic attacks   . Hypertension   . Diabetes mellitus without complication   . Dyslipidemia   . Degenerative joint disease    Past Surgical History  Procedure Laterality Date  . Cholecystectomy    . Abdominal hysterectomy     No family history on file. History  Substance Use Topics  . Smoking status: Former Research scientist (life sciences)  . Smokeless tobacco: Not on file  . Alcohol Use: No   OB History   Grav Para Term Preterm Abortions TAB SAB Ect Mult Living                 Review of Systems  Constitutional: Negative for fever and fatigue.  HENT: Negative for congestion and drooling.   Eyes: Negative for pain.  Respiratory: Negative for cough and shortness of breath.   Cardiovascular: Negative for chest pain.  Gastrointestinal: Positive for nausea, vomiting, abdominal pain and diarrhea.  Genitourinary: Negative for dysuria and  hematuria.  Musculoskeletal: Negative for back pain, gait problem and neck pain.  Skin: Negative for color change.  Neurological: Negative for dizziness and headaches.  Hematological: Negative for adenopathy.  Psychiatric/Behavioral: Negative for behavioral problems.  All other systems reviewed and are negative.     Allergies  Morphine and related  Home Medications   Prior to Admission medications   Medication Sig Start Date End Date Taking? Authorizing Provider  aspirin EC 325 MG tablet Take 325 mg by mouth daily.     Yes Historical Provider, MD  lansoprazole (PREVACID) 15 MG capsule Take 15 mg by mouth daily at 12 noon.   Yes Historical Provider, MD  lisinopril (PRINIVIL,ZESTRIL) 10 MG tablet Take 10 mg by mouth daily.   Yes Historical Provider, MD  LORazepam (ATIVAN) 1 MG tablet Take 0.5 mg by mouth at bedtime as needed for anxiety. As needed for anxiety   Yes Historical Provider, MD  oxymetazoline (AFRIN) 0.05 % nasal spray Place 1-2 sprays into both nostrils 2 (two) times daily as needed for congestion.   Yes Historical Provider, MD  verapamil (CALAN-SR) 120 MG CR tablet Take 1 tablet (120 mg total) by mouth daily. 09/10/11 04/23/14 Yes Bonnielee Haff, MD   BP 130/72  Pulse 77  Temp(Src) 97.8 F (36.6 C) (Oral)  Resp 16  SpO2 96% Physical Exam  Nursing note and vitals reviewed. Constitutional: She  is oriented to person, place, and time. She appears well-developed and well-nourished.  HENT:  Head: Normocephalic.  Mouth/Throat: Oropharynx is clear and moist. No oropharyngeal exudate.  Eyes: Conjunctivae and EOM are normal. Pupils are equal, round, and reactive to light.  Neck: Normal range of motion. Neck supple.  Cardiovascular: Normal rate, regular rhythm, normal heart sounds and intact distal pulses.  Exam reveals no gallop and no friction rub.   No murmur heard. Pulmonary/Chest: Effort normal and breath sounds normal. No respiratory distress. She has no wheezes.   Abdominal: Soft. Bowel sounds are normal. There is tenderness (mild tenderness to palpation of the epigastric area.). There is no rebound and no guarding.  Musculoskeletal: Normal range of motion. She exhibits no edema and no tenderness.  Neurological: She is alert and oriented to person, place, and time.  Skin: Skin is warm and dry.  Psychiatric: She has a normal mood and affect. Her behavior is normal.    ED Course  Procedures (including critical care time) Labs Review Labs Reviewed  CBC WITH DIFFERENTIAL - Abnormal; Notable for the following:    Lymphocytes Relative 51 (*)    Monocytes Relative 0 (*)    Monocytes Absolute 0.0 (*)    All other components within normal limits  COMPREHENSIVE METABOLIC PANEL - Abnormal; Notable for the following:    Glucose, Bld 152 (*)    GFR calc non Af Amer 49 (*)    GFR calc Af Amer 56 (*)    All other components within normal limits  LIPASE, BLOOD  URINALYSIS, ROUTINE W REFLEX MICROSCOPIC  COMPREHENSIVE METABOLIC PANEL  CBC  I-STAT CG4 LACTIC ACID, ED    Imaging Review Ct Abdomen Pelvis W Contrast  04/23/2014   CLINICAL DATA:  Epigastric pain and vomiting. History of cholecystectomy.  EXAM: CT ABDOMEN AND PELVIS WITH CONTRAST  TECHNIQUE: Multidetector CT imaging of the abdomen and pelvis was performed using the standard protocol following bolus administration of intravenous contrast.  CONTRAST:  116mL OMNIPAQUE IOHEXOL 300 MG/ML  SOLN  COMPARISON:  08/17/2011  FINDINGS: Subsegmental atelectasis is present in the lung bases. 3 vessel coronary artery calcifications are noted.  The gallbladder is surgically absent. The liver, spleen, adrenal glands, kidneys, and pancreas have an unremarkable enhanced appearance. Mild dilatation of the common bile duct is unchanged and likely related to prior cholecystectomy.  There is new, moderate volume ascites. There is moderate dilatation of multiple fluid-filled small bowel loops measuring up to approximately 4  cm in diameter. A small amount of pneumatosis is questioned in a small bowel loop in the right mid abdomen (series 2, images 37 and 39). The distal ileum and colon are largely decompressed. There is a small bowel containing ventral hernia, although contrast does pass beyond this, and the level of apparent small bowel obstruction is quite distal to this.  Sequelae of interval hysterectomy are identified. There is a partially calcified 4.8 x 3.9 cm soft tissue mass in the right hemipelvis. Cystic adnexal mass has been resected. There is soft tissue fullness in the peritoneum, stable to slightly more prominent than on the prior study. Right inguinal lymph nodes measure up to 1.3 cm in short axis, mildly increased in size compared to the prior study. Bladder is partially decompressed with possible mild bladder wall thickening. Advanced atherosclerotic calcification is noted of the abdominal aorta and common iliac arteries. Sclerosis about the right greater than left sacroiliac joints is unchanged. Advanced disc degeneration is seen throughout the lower thoracic and lumbar spine.  IMPRESSION: 1. High-grade small bowel obstruction with transition to decompressed distal small bowel in the right mid abdomen. Early pneumatosis is questioned in a small bowel loop in the right mid abdomen. 2. Interval hysterectomy with resection of the cystic mass described on the prior CT. Partially calcified soft tissue nodule is present in the posterior right pelvis, and this is concerning for a metastatic deposit. 3. Mild interval enlargement of right inguinal lymph nodes, nonspecific however nodal metastatic disease is possible. 4. Soft tissue fullness in the perineum, stable to slightly more prominent than on the prior study. This may reflect pelvic floor laxity, although clinical correlation is recommended to excluded metastatic disease in the low pelvis.   Electronically Signed   By: Logan Bores   On: 04/23/2014 12:50     EKG  Interpretation   Date/Time:  Monday April 23 2014 11:05:16 EDT Ventricular Rate:  64 PR Interval:  193 QRS Duration: 85 QT Interval:  397 QTC Calculation: 410 R Axis:   -30 Text Interpretation:  Sinus rhythm Inferior infarct, old No significant  change since last tracing Confirmed by HARRISON  MD, FORREST ([id number]) on  04/23/2014 11:21:25 AM      MDM   Final diagnoses:  Small bowel obstruction    11:03 AM 80 y.o. female w hx of DM, gerd, s/p cholecystectomy who presents with intermittent nausea, vomiting, diarrhea, and epigastric discomfort for the last 4 days. She presents now due to the persistence of her symptoms. She denies any fevers at home or blood in her stool. She is afebrile and vital signs are unremarkable here. Pain only with palpation on exam. Will get screening labs and imaging.  Found to have high grade sbo. Consult [facility] who will see the pt. Will admit to hospitalist.     Blanchard Kelch, MD 04/23/14 [id number]

## 2014-04-23 NOTE — ED Notes (Addendum)
N/V/D began 3 days ago. Worsening the morning upon awakening at 0500.

## 2014-04-23 NOTE — ED Notes (Signed)
General surgery consult at bedside for evaluation.

## 2014-04-23 NOTE — Consult Note (Signed)
Reason for Consult: abdominal pain, diarrhea Referring Physician: Dr. Pamella Pert  HPI: Erika Day is a 80 year female with a history of hypertension, stroke, GERD, oophorectomy/hysterectomy for a benign pelvic mass which was causing a bowel obstruction in 2012 at [facility], cholecystectomy and DDD mostly wheelchair bound.  She denies a history of diabetes mellitus.  Denies other abdominal surgeries.  She presents today with abdominal pain for 3 weeks.  Onset was gradual.  Coarse is worsening.  Time pattern is intermittent.  Moderate in severity.  Suddenly worsened about 3 days ago.  Aggravated by movement.  No alleviating factors.  Associated with nausea and vomiting.  She reports diarrhea x4 days now, non bloody.  She is normally constipated.  She takes miralax on daily basis.  She was seen by her primary care Marijayne Rauth last Thursday and placed on prevacid which did not help her symptoms.  Appetite has been adequate.  Last oral intake was last night.  She was able to drink her contrast and tolerate water today.  She denies fever, chills or sweats.  She denies recent weight loss.  She denies previous cardiac history and does not take any blood thinners.    Past Medical History  Diagnosis Date  . Stroke   . Acid reflux disease   . Panic attacks   . Hypertension   . DDD (degenerative disc disease), lumbar   . Dyslipidemia   . Degenerative joint disease     Past Surgical History  Procedure Laterality Date  . Cholecystectomy    . Abdominal hysterectomy      No family history on file.  Social History:  reports that she has quit smoking. She does not have any smokeless tobacco history on file. She reports that she does not drink alcohol or use illicit drugs.  Allergies:  Allergies  Allergen Reactions  . Morphine And Related Other (See Comments)    Blood sugar dropped     Medications: Scheduled Meds: Continuous Infusions: PRN Meds:.     Results for orders placed during  the hospital encounter of 04/23/14 (from the past 48 hour(s))  CBC WITH DIFFERENTIAL     Status: Abnormal   Collection Time    04/23/14 10:52 AM      Result Value Ref Range   WBC 7.7  4.0 - 10.5 K/uL   RBC 4.41  3.87 - 5.11 MIL/uL   Hemoglobin 12.4  12.0 - 15.0 g/dL   HCT 37.5  36.0 - 46.0 %   MCV 85.0  78.0 - 100.0 fL   MCH 28.1  26.0 - 34.0 pg   MCHC 33.1  30.0 - 36.0 g/dL   RDW 13.2  11.5 - 15.5 %   Platelets 160  150 - 400 K/uL   Neutrophils Relative % 49  43 - 77 %   Neutro Abs 3.7  1.7 - 7.7 K/uL   Lymphocytes Relative 51 (*) 12 - 46 %   Lymphs Abs 3.9  0.7 - 4.0 K/uL   Monocytes Relative 0 (*) 3 - 12 %   Monocytes Absolute 0.0 (*) 0.1 - 1.0 K/uL   Eosinophils Relative 0  0 - 5 %   Eosinophils Absolute 0.0  0.0 - 0.7 K/uL   Basophils Relative 0  0 - 1 %   Basophils Absolute 0.0  0.0 - 0.1 K/uL  COMPREHENSIVE METABOLIC PANEL     Status: Abnormal   Collection Time    04/23/14 10:52 AM      Result  Value Ref Range   Sodium 139  137 - 147 mEq/L   Potassium 5.0  3.7 - 5.3 mEq/L   Chloride 101  96 - 112 mEq/L   CO2 26  19 - 32 mEq/L   Glucose, Bld 152 (*) 70 - 99 mg/dL   BUN 11  6 - 23 mg/dL   Creatinine, Ser 1.04  0.50 - 1.10 mg/dL   Calcium 10.5  8.4 - 10.5 mg/dL   Total Protein 7.2  6.0 - 8.3 g/dL   Albumin 3.7  3.5 - 5.2 g/dL   AST 13  0 - 37 U/L   ALT 6  0 - 35 U/L   Alkaline Phosphatase 77  39 - 117 U/L   Total Bilirubin 0.3  0.3 - 1.2 mg/dL   GFR calc non Af Amer 49 (*) >90 mL/min   GFR calc Af Amer 56 (*) >90 mL/min   Comment: (NOTE)     The eGFR has been calculated using the CKD EPI equation.     This calculation has not been validated in all clinical situations.     eGFR's persistently <90 mL/min signify possible Chronic Kidney     Disease.  LIPASE, BLOOD     Status: None   Collection Time    04/23/14 10:52 AM      Result Value Ref Range   Lipase 34  11 - 59 U/L  URINALYSIS, ROUTINE W REFLEX MICROSCOPIC     Status: None   Collection Time    04/23/14  11:45 AM      Result Value Ref Range   Color, Urine YELLOW  YELLOW   APPearance CLEAR  CLEAR   Specific Gravity, Urine 1.017  1.005 - 1.030   pH 6.0  5.0 - 8.0   Glucose, UA NEGATIVE  NEGATIVE mg/dL   Hgb urine dipstick NEGATIVE  NEGATIVE   Bilirubin Urine NEGATIVE  NEGATIVE   Ketones, ur NEGATIVE  NEGATIVE mg/dL   Protein, ur NEGATIVE  NEGATIVE mg/dL   Urobilinogen, UA 1.0  0.0 - 1.0 mg/dL   Nitrite NEGATIVE  NEGATIVE   Leukocytes, UA NEGATIVE  NEGATIVE   Comment: MICROSCOPIC NOT DONE ON URINES WITH NEGATIVE PROTEIN, BLOOD, LEUKOCYTES, NITRITE, OR GLUCOSE <1000 mg/dL.  I-STAT CG4 LACTIC ACID, ED     Status: None   Collection Time    04/23/14  2:38 PM      Result Value Ref Range   Lactic Acid, Venous 1.38  0.5 - 2.2 mmol/L    Ct Abdomen Pelvis W Contrast  04/23/2014   CLINICAL DATA:  Epigastric pain and vomiting. History of cholecystectomy.  EXAM: CT ABDOMEN AND PELVIS WITH CONTRAST  TECHNIQUE: Multidetector CT imaging of the abdomen and pelvis was performed using the standard protocol following bolus administration of intravenous contrast.  CONTRAST:  155m OMNIPAQUE IOHEXOL 300 MG/ML  SOLN  COMPARISON:  08/17/2011  FINDINGS: Subsegmental atelectasis is present in the lung bases. 3 vessel coronary artery calcifications are noted.  The gallbladder is surgically absent. The liver, spleen, adrenal glands, kidneys, and pancreas have an unremarkable enhanced appearance. Mild dilatation of the common bile duct is unchanged and likely related to prior cholecystectomy.  There is new, moderate volume ascites. There is moderate dilatation of multiple fluid-filled small bowel loops measuring up to approximately 4 cm in diameter. A small amount of pneumatosis is questioned in a small bowel loop in the right mid abdomen (series 2, images 37 and 39). The distal ileum and colon are largely decompressed.  There is a small bowel containing ventral hernia, although contrast does pass beyond this, and the level  of apparent small bowel obstruction is quite distal to this.  Sequelae of interval hysterectomy are identified. There is a partially calcified 4.8 x 3.9 cm soft tissue mass in the right hemipelvis. Cystic adnexal mass has been resected. There is soft tissue fullness in the peritoneum, stable to slightly more prominent than on the prior study. Right inguinal lymph nodes measure up to 1.3 cm in short axis, mildly increased in size compared to the prior study. Bladder is partially decompressed with possible mild bladder wall thickening. Advanced atherosclerotic calcification is noted of the abdominal aorta and common iliac arteries. Sclerosis about the right greater than left sacroiliac joints is unchanged. Advanced disc degeneration is seen throughout the lower thoracic and lumbar spine.  IMPRESSION: 1. High-grade small bowel obstruction with transition to decompressed distal small bowel in the right mid abdomen. Early pneumatosis is questioned in a small bowel loop in the right mid abdomen. 2. Interval hysterectomy with resection of the cystic mass described on the prior CT. Partially calcified soft tissue nodule is present in the posterior right pelvis, and this is concerning for a metastatic deposit. 3. Mild interval enlargement of right inguinal lymph nodes, nonspecific however nodal metastatic disease is possible. 4. Soft tissue fullness in the perineum, stable to slightly more prominent than on the prior study. This may reflect pelvic floor laxity, although clinical correlation is recommended to excluded metastatic disease in the low pelvis.   Electronically Signed   By: Logan Bores   On: 04/23/2014 12:50    Review of Systems  All other systems reviewed and are negative.  Blood pressure 170/69, pulse 66, temperature 97.8 F (36.6 C), temperature source Oral, resp. rate 16, SpO2 96.00%. Physical Exam  Constitutional: She appears well-developed and well-nourished. No distress.  HENT:  Head:  Normocephalic and atraumatic.  Mouth/Throat: No oropharyngeal exudate.  Neck: Normal range of motion. Neck supple.  Cardiovascular: Normal rate, regular rhythm and intact distal pulses.  Exam reveals no gallop and no friction rub.   Murmur heard. Respiratory: Effort normal and breath sounds normal. No respiratory distress. She has no wheezes. She has no rales. She exhibits no tenderness.  GI: Soft. Bowel sounds are normal. She exhibits no distension and no mass. There is no rebound and no guarding.  Easily reducible umbilical hernia, non tender and no erythema.  Minimal ttp to epigastric region. Midline scar  Skin: Skin is warm and dry. No rash noted. She is not diaphoretic. No erythema. No pallor.  Psychiatric: Her behavior is normal. Judgment and thought content normal.    Assessment/Plan: Abdominal pain, diarrhea, nausea Possible PSBO  Admit to medicine Despite her CT findings, clinically she does not look obstructed.  Normal white count and lactic acid.  No indication for surgical intervention.  Recommend conservative management,  NGT for decompression, bowel rest, monitor electrolytes and repeat films in AM.  Will follow along.    Erby Pian ANP-BC Pager [phone] 04/23/2014, 3:02 PM

## 2014-04-23 NOTE — ED Notes (Signed)
NOTIFIED DR. HARRISON FOR PATIENTS LAB RESULTS OF CG4+LACTIC ACID ,@14 :50 PM ,04/23/2014.

## 2014-04-23 NOTE — Consult Note (Signed)
Agree with above, has hernia but this reducible, needs ng tube, repeat films in am, hopefully resolves conservatively.

## 2014-04-24 ENCOUNTER — Inpatient Hospital Stay (HOSPITAL_COMMUNITY): Payer: PRIVATE HEALTH INSURANCE

## 2014-04-24 DIAGNOSIS — K56609 Unspecified intestinal obstruction, unspecified as to partial versus complete obstruction: Secondary | ICD-10-CM

## 2014-04-24 LAB — COMPREHENSIVE METABOLIC PANEL
ALT: 5 U/L (ref 0–35)
AST: 12 U/L (ref 0–37)
Albumin: 2.8 g/dL — ABNORMAL LOW (ref 3.5–5.2)
Alkaline Phosphatase: 60 U/L (ref 39–117)
BUN: 12 mg/dL (ref 6–23)
CHLORIDE: 107 meq/L (ref 96–112)
CO2: 21 mEq/L (ref 19–32)
Calcium: 9.3 mg/dL (ref 8.4–10.5)
Creatinine, Ser: 1.15 mg/dL — ABNORMAL HIGH (ref 0.50–1.10)
GFR calc Af Amer: 50 mL/min — ABNORMAL LOW (ref >90)
GFR calc non Af Amer: 43 mL/min — ABNORMAL LOW (ref >90)
GLUCOSE: 74 mg/dL (ref 70–99)
Potassium: 4.4 mEq/L (ref 3.7–5.3)
SODIUM: 141 meq/L (ref 137–147)
Total Bilirubin: 0.3 mg/dL (ref 0.3–1.2)
Total Protein: 5.6 g/dL — ABNORMAL LOW (ref 6.0–8.3)

## 2014-04-24 LAB — CBC
HCT: 32.1 % — ABNORMAL LOW (ref 36.0–46.0)
HEMOGLOBIN: 10.5 g/dL — AB (ref 12.0–15.0)
MCH: 27.9 pg (ref 26.0–34.0)
MCHC: 32.7 g/dL (ref 30.0–36.0)
MCV: 85.4 fL (ref 78.0–100.0)
Platelets: 147 10*3/uL — ABNORMAL LOW (ref 150–400)
RBC: 3.76 MIL/uL — ABNORMAL LOW (ref 3.87–5.11)
RDW: 13.3 % (ref 11.5–15.5)
WBC: 10.2 10*3/uL (ref 4.0–10.5)

## 2014-04-24 MED ORDER — SALINE SPRAY 0.65 % NA SOLN
1.0000 | NASAL | Status: DC | PRN
  Administered 2014-05-01 – 2014-05-02 (×2): 1 via NASAL
  Filled 2014-04-24 (×2): qty 44

## 2014-04-24 MED ORDER — ENOXAPARIN SODIUM 30 MG/0.3ML ~~LOC~~ SOLN
30.0000 mg | SUBCUTANEOUS | Status: DC
  Administered 2014-04-24 – 2014-04-29 (×6): 30 mg via SUBCUTANEOUS
  Filled 2014-04-24 (×7): qty 0.3

## 2014-04-24 MED ORDER — BIOTENE DRY MOUTH MT LIQD
15.0000 mL | Freq: Two times a day (BID) | OROMUCOSAL | Status: DC
  Administered 2014-04-24 – 2014-05-08 (×28): 15 mL via OROMUCOSAL

## 2014-04-24 NOTE — Progress Notes (Signed)
Patient ID: Erika Day  female  GHW:[id number]    DOB: [birth year]    DOA: 04/23/2014  PCP: Philis Fendt, MD  Assessment/Plan: Principal Problem:   SBO (small bowel obstruction) - Continue NGT suction, n.p.o., IV fluids, currently on conservative management - General surgery following, will defer to surgery for further management - Serial abdominal x-rays - Increase activity today   Active Problems:   HTN (hypertension), benign - Continue IV antihypertensives as needed    DM (diabetes mellitus), type 2, uncontrolled -  Continue sliding scale insulin  Anxiety:  - Placed on Ativan as needed  DVT Prophylaxis: Lovenox  Code Status: Full code  Family Communication: Discussed with patient at the bedside  Disposition:  Consultants:  General surgery  Procedures:  NGT suction  Antibiotics:  None    Subjective: Patient seen and examined, feels some anxiety,   Objective: Weight change:   Intake/Output Summary (Last 24 hours) at 04/24/14 1300 Last data filed at 04/24/14 0547  Gross per 24 hour  Intake      0 ml  Output    650 ml  Net   -650 ml   Blood pressure 108/58, pulse 71, temperature 97.5 F (36.4 C), temperature source Oral, resp. rate 16, weight 78.155 kg (172 lb 4.8 oz), SpO2 98.00%.  Physical Exam: General: Alert and awake, oriented x3, not in any acute distress. CVS: S1-S2 clear, no murmur rubs or gallops Chest: clear to auscultation bilaterally, no wheezing, rales or rhonchi Abdomen: soft nontender, distended,  ventral hernia, hypoactive bowel sounds  Extremities: no cyanosis, clubbing or edema noted bilaterally Neuro: Cranial nerves II-XII intact, no focal neurological deficits  Lab Results: Basic Metabolic Panel:  Recent Labs Lab 04/23/14 1052 04/24/14 0421  NA 139 141  K 5.0 4.4  CL 101 107  CO2 26 21  GLUCOSE 152* 74  BUN 11 12  CREATININE 1.04 1.15*  CALCIUM 10.5 9.3   Liver Function Tests:  Recent Labs Lab  04/23/14 1052 04/24/14 0421  AST 13 12  ALT 6 5  ALKPHOS 77 60  BILITOT 0.3 0.3  PROT 7.2 5.6*  ALBUMIN 3.7 2.8*    Recent Labs Lab 04/23/14 1052  LIPASE 34   No results found for this basename: AMMONIA,  in the last 168 hours CBC:  Recent Labs Lab 04/23/14 1052 04/24/14 0421  WBC 7.7 10.2  NEUTROABS 3.7  --   HGB 12.4 10.5*  HCT 37.5 32.1*  MCV 85.0 85.4  PLT 160 147*   Cardiac Enzymes: No results found for this basename: CKTOTAL, CKMB, CKMBINDEX, TROPONINI,  in the last 168 hours BNP: No components found with this basename: POCBNP,  CBG: No results found for this basename: GLUCAP,  in the last 168 hours   Micro Results: No results found for this or any previous visit (from the past 240 hour(s)).  Studies/Results: Ct Abdomen Pelvis W Contrast  04/23/2014   CLINICAL DATA:  Epigastric pain and vomiting. History of cholecystectomy.  EXAM: CT ABDOMEN AND PELVIS WITH CONTRAST  TECHNIQUE: Multidetector CT imaging of the abdomen and pelvis was performed using the standard protocol following bolus administration of intravenous contrast.  CONTRAST:  111mL OMNIPAQUE IOHEXOL 300 MG/ML  SOLN  COMPARISON:  08/17/2011  FINDINGS: Subsegmental atelectasis is present in the lung bases. 3 vessel coronary artery calcifications are noted.  The gallbladder is surgically absent. The liver, spleen, adrenal glands, kidneys, and pancreas have an unremarkable enhanced appearance. Mild dilatation of the common bile duct is unchanged  and likely related to prior cholecystectomy.  There is new, moderate volume ascites. There is moderate dilatation of multiple fluid-filled small bowel loops measuring up to approximately 4 cm in diameter. A small amount of pneumatosis is questioned in a small bowel loop in the right mid abdomen (series 2, images 37 and 39). The distal ileum and colon are largely decompressed. There is a small bowel containing ventral hernia, although contrast does pass beyond this, and  the level of apparent small bowel obstruction is quite distal to this.  Sequelae of interval hysterectomy are identified. There is a partially calcified 4.8 x 3.9 cm soft tissue mass in the right hemipelvis. Cystic adnexal mass has been resected. There is soft tissue fullness in the peritoneum, stable to slightly more prominent than on the prior study. Right inguinal lymph nodes measure up to 1.3 cm in short axis, mildly increased in size compared to the prior study. Bladder is partially decompressed with possible mild bladder wall thickening. Advanced atherosclerotic calcification is noted of the abdominal aorta and common iliac arteries. Sclerosis about the right greater than left sacroiliac joints is unchanged. Advanced disc degeneration is seen throughout the lower thoracic and lumbar spine.  IMPRESSION: 1. High-grade small bowel obstruction with transition to decompressed distal small bowel in the right mid abdomen. Early pneumatosis is questioned in a small bowel loop in the right mid abdomen. 2. Interval hysterectomy with resection of the cystic mass described on the prior CT. Partially calcified soft tissue nodule is present in the posterior right pelvis, and this is concerning for a metastatic deposit. 3. Mild interval enlargement of right inguinal lymph nodes, nonspecific however nodal metastatic disease is possible. 4. Soft tissue fullness in the perineum, stable to slightly more prominent than on the prior study. This may reflect pelvic floor laxity, although clinical correlation is recommended to excluded metastatic disease in the low pelvis.   Electronically Signed   By: Logan Bores   On: 04/23/2014 12:50   Dg Abd 2 Views  04/24/2014   CLINICAL DATA:  Abdominal pain.  Followup small bowel obstruction.  EXAM: ABDOMEN - 2 VIEW  COMPARISON:  04/23/2014  FINDINGS: Nasogastric tube is in adequate position with side port and tip over the stomach in the left upper quadrant. Examination demonstrates air  and stool throughout the colon. There is persistence of several air-filled dilated small bowel loops in a few scattered air-fluid levels without significant overall change. There is no evidence of free peritoneal air. Contrast present over the bladder. Remainder of the exam is unchanged.  IMPRESSION: Persistent air-filled dilated small bowel loops in the left mid abdomen without significant overall change compatible with known small bowel obstruction.  Nasogastric tube in adequate position.   Electronically Signed   By: Marin Olp M.D.   On: 04/24/2014 08:17   Dg Abd Portable 1v  04/23/2014   CLINICAL DATA:  Nasogastric tube placement, recently advanced.  EXAM: PORTABLE ABDOMEN - 1 VIEW  COMPARISON:  None.  FINDINGS: The nasogastric tube is blurred by motion artifact. Its tip is especially blurred. The tube is noted to extend down into the stomach body and possibly to the antrum, but is not visible distal to this. There appears to be an adequate amount of the tube within the stomach.  Thoracolumbar spondylosis and degenerative disc disease.  Abnormal dilated small bowel, up to 4.4 cm, compatible with small bowel obstruction.  IMPRESSION: 1. Nasogastric tube tip is probably in the stomach body - the tip itself is  obscured by motion artifact causing blurring. 2. Dilated bowel compatible with small bowel obstruction.   Electronically Signed   By: Sherryl Barters M.D.   On: 04/23/2014 23:55   Dg Abd Portable 1v  04/23/2014   CLINICAL DATA:  Tube placement.  EXAM: PORTABLE ABDOMEN - 1 VIEW  COMPARISON:  CT 04/23/2014.  Abdomen 08/17/2011  FINDINGS: An enteric tube is been placed. The tip is at the left medial costophrenic angle consistent with location at the EG junction. Proximal side hole is probably in the distal esophagus. Visualized small bowel demonstrate mild gaseous distention. Residual contrast material in the bladder.  IMPRESSION: Negative.   Electronically Signed   By: Lucienne Capers M.D.   On:  04/23/2014 22:36    Medications: Scheduled Meds: . antiseptic oral rinse  15 mL Mouth Rinse BID  . enoxaparin (LOVENOX) injection  30 mg Subcutaneous Q24H      LOS: 1 day   RAI,RIPUDEEP M.D. [facility] 04/24/2014, 1:00 PM Pager: [phone]  If 7PM-7AM, please contact night-coverage [email or web address] Password [email or web address]  **Disclaimer: This note was dictated with voice recognition software. Similar sounding words can inadvertently be transcribed and this note may contain transcription errors which may not have been corrected upon publication of note.**

## 2014-04-24 NOTE — Progress Notes (Signed)
Patient ID: Erika Day, female   DOB: 1930/06/22, 80 y.o.   MRN: [id number]    Subjective: Pt feels ok.  Has some gas type pains behind left breast. No flatus or BM.  Had BM yesterday  Objective: Vital signs in last 24 hours: Temp:  [97.5 F (36.4 C)-98.8 F (37.1 C)] 98.8 F (37.1 C) (06/23 0526) Pulse Rate:  [62-92] 74 (06/23 0526) Resp:  [13-21] 16 (06/23 0526) BP: (92-170)/(59-80) 105/65 mmHg (06/23 0526) SpO2:  [92 %-98 %] 96 % (06/23 0526) Weight:  [172 lb 4.8 oz (78.155 kg)] 172 lb 4.8 oz (78.155 kg) (06/22 2102) Last BM Date: 04/23/14  Intake/Output from previous day: 06/22 0701 - 06/23 0700 In: 0  Out: 650 [Emesis/NG output:650] Intake/Output this shift:    PE: Abd: soft, hernia reducible with a wide defect about 2.5cm.  ND, NGT with feculent output  Lab Results:   Recent Labs  04/23/14 1052 04/24/14 0421  WBC 7.7 10.2  HGB 12.4 10.5*  HCT 37.5 32.1*  PLT 160 147*   BMET  Recent Labs  04/23/14 1052 04/24/14 0421  NA 139 141  K 5.0 4.4  CL 101 107  CO2 26 21  GLUCOSE 152* 74  BUN 11 12  CREATININE 1.04 1.15*  CALCIUM 10.5 9.3   PT/INR No results found for this basename: LABPROT, INR,  in the last 72 hours CMP     Component Value Date/Time   NA 141 04/24/2014 0421   K 4.4 04/24/2014 0421   CL 107 04/24/2014 0421   CO2 21 04/24/2014 0421   GLUCOSE 74 04/24/2014 0421   BUN 12 04/24/2014 0421   CREATININE 1.15* 04/24/2014 0421   CALCIUM 9.3 04/24/2014 0421   PROT 5.6* 04/24/2014 0421   ALBUMIN 2.8* 04/24/2014 0421   AST 12 04/24/2014 0421   ALT 5 04/24/2014 0421   ALKPHOS 60 04/24/2014 0421   BILITOT 0.3 04/24/2014 0421   GFRNONAA 43* 04/24/2014 0421   GFRAA 50* 04/24/2014 0421   Lipase     Component Value Date/Time   LIPASE 34 04/23/2014 1052       Studies/Results: Ct Abdomen Pelvis W Contrast  04/23/2014   CLINICAL DATA:  Epigastric pain and vomiting. History of cholecystectomy.  EXAM: CT ABDOMEN AND PELVIS WITH CONTRAST  TECHNIQUE:  Multidetector CT imaging of the abdomen and pelvis was performed using the standard protocol following bolus administration of intravenous contrast.  CONTRAST:  153mL OMNIPAQUE IOHEXOL 300 MG/ML  SOLN  COMPARISON:  08/17/2011  FINDINGS: Subsegmental atelectasis is present in the lung bases. 3 vessel coronary artery calcifications are noted.  The gallbladder is surgically absent. The liver, spleen, adrenal glands, kidneys, and pancreas have an unremarkable enhanced appearance. Mild dilatation of the common bile duct is unchanged and likely related to prior cholecystectomy.  There is new, moderate volume ascites. There is moderate dilatation of multiple fluid-filled small bowel loops measuring up to approximately 4 cm in diameter. A small amount of pneumatosis is questioned in a small bowel loop in the right mid abdomen (series 2, images 37 and 39). The distal ileum and colon are largely decompressed. There is a small bowel containing ventral hernia, although contrast does pass beyond this, and the level of apparent small bowel obstruction is quite distal to this.  Sequelae of interval hysterectomy are identified. There is a partially calcified 4.8 x 3.9 cm soft tissue mass in the right hemipelvis. Cystic adnexal mass has been resected. There is soft tissue fullness in the  peritoneum, stable to slightly more prominent than on the prior study. Right inguinal lymph nodes measure up to 1.3 cm in short axis, mildly increased in size compared to the prior study. Bladder is partially decompressed with possible mild bladder wall thickening. Advanced atherosclerotic calcification is noted of the abdominal aorta and common iliac arteries. Sclerosis about the right greater than left sacroiliac joints is unchanged. Advanced disc degeneration is seen throughout the lower thoracic and lumbar spine.  IMPRESSION: 1. High-grade small bowel obstruction with transition to decompressed distal small bowel in the right mid abdomen. Early  pneumatosis is questioned in a small bowel loop in the right mid abdomen. 2. Interval hysterectomy with resection of the cystic mass described on the prior CT. Partially calcified soft tissue nodule is present in the posterior right pelvis, and this is concerning for a metastatic deposit. 3. Mild interval enlargement of right inguinal lymph nodes, nonspecific however nodal metastatic disease is possible. 4. Soft tissue fullness in the perineum, stable to slightly more prominent than on the prior study. This may reflect pelvic floor laxity, although clinical correlation is recommended to excluded metastatic disease in the low pelvis.   Electronically Signed   By: Logan Bores   On: 04/23/2014 12:50   Dg Abd 2 Views  04/24/2014   CLINICAL DATA:  Abdominal pain.  Followup small bowel obstruction.  EXAM: ABDOMEN - 2 VIEW  COMPARISON:  04/23/2014  FINDINGS: Nasogastric tube is in adequate position with side port and tip over the stomach in the left upper quadrant. Examination demonstrates air and stool throughout the colon. There is persistence of several air-filled dilated small bowel loops in a few scattered air-fluid levels without significant overall change. There is no evidence of free peritoneal air. Contrast present over the bladder. Remainder of the exam is unchanged.  IMPRESSION: Persistent air-filled dilated small bowel loops in the left mid abdomen without significant overall change compatible with known small bowel obstruction.  Nasogastric tube in adequate position.   Electronically Signed   By: Marin Olp M.D.   On: 04/24/2014 08:17   Dg Abd Portable 1v  04/23/2014   CLINICAL DATA:  Nasogastric tube placement, recently advanced.  EXAM: PORTABLE ABDOMEN - 1 VIEW  COMPARISON:  None.  FINDINGS: The nasogastric tube is blurred by motion artifact. Its tip is especially blurred. The tube is noted to extend down into the stomach body and possibly to the antrum, but is not visible distal to this. There  appears to be an adequate amount of the tube within the stomach.  Thoracolumbar spondylosis and degenerative disc disease.  Abnormal dilated small bowel, up to 4.4 cm, compatible with small bowel obstruction.  IMPRESSION: 1. Nasogastric tube tip is probably in the stomach body - the tip itself is obscured by motion artifact causing blurring. 2. Dilated bowel compatible with small bowel obstruction.   Electronically Signed   By: Sherryl Barters M.D.   On: 04/23/2014 23:55   Dg Abd Portable 1v  04/23/2014   CLINICAL DATA:  Tube placement.  EXAM: PORTABLE ABDOMEN - 1 VIEW  COMPARISON:  CT 04/23/2014.  Abdomen 08/17/2011  FINDINGS: An enteric tube is been placed. The tip is at the left medial costophrenic angle consistent with location at the EG junction. Proximal side hole is probably in the distal esophagus. Visualized small bowel demonstrate mild gaseous distention. Residual contrast material in the bladder.  IMPRESSION: Negative.   Electronically Signed   By: Lucienne Capers M.D.   On: 04/23/2014 22:36  Anti-infectives: Anti-infectives   None       Assessment/Plan 1. PSBO  2.  Ventral hernia that is reducible  This is not source of SBO 3.  History of stroke 4.  HTN 5.  Diabetes mellitus  Glucose - 74 - 04/24/2014 6.  Mildly elevated creatinine  7.  Limited physical activity prior to admission  Will ask PT to see 8.  Start DVT prophylaxis - Lovenox  Plan: 1. Films look somewhat improved to me, but still read as SBO 2. Cont NGT and conservative management for now. 3. OOB to chair.  Patient says she doesn't mobilize at home   LOS: 1 day   OSBORNE,KELLY E 04/24/2014, 8:40 AM Pager: [phone]  Agree with above. Doing better than yesterday.  Daughter, Erika Day, in room with patient. She had a large loose stool last PM. Her KUB today was not very good quality - maybe a little better than yesterday.  Plan to repeat films tomorrow. Now she is hungry.  Alphonsa Overall, MD, [facility] Pager: [phone] Office phone:  [phone]

## 2014-04-24 NOTE — Progress Notes (Signed)
Utilization review completed.  

## 2014-04-25 ENCOUNTER — Inpatient Hospital Stay (HOSPITAL_COMMUNITY): Payer: PRIVATE HEALTH INSURANCE

## 2014-04-25 LAB — BASIC METABOLIC PANEL
BUN: 18 mg/dL (ref 6–23)
BUN: 19 mg/dL (ref 6–23)
CALCIUM: 9.5 mg/dL (ref 8.4–10.5)
CHLORIDE: 106 meq/L (ref 96–112)
CO2: 19 mEq/L (ref 19–32)
CO2: 20 mEq/L (ref 19–32)
Calcium: 9.5 mg/dL (ref 8.4–10.5)
Chloride: 106 mEq/L (ref 96–112)
Creatinine, Ser: 1.2 mg/dL — ABNORMAL HIGH (ref 0.50–1.10)
Creatinine, Ser: 1.18 mg/dL — ABNORMAL HIGH (ref 0.50–1.10)
GFR calc non Af Amer: 42 mL/min — ABNORMAL LOW (ref >90)
GFR, EST AFRICAN AMERICAN: 47 mL/min — AB (ref >90)
GFR, EST AFRICAN AMERICAN: 48 mL/min — AB (ref >90)
GFR, EST NON AFRICAN AMERICAN: 41 mL/min — AB (ref >90)
GLUCOSE: 51 mg/dL — AB (ref 70–99)
Glucose, Bld: 134 mg/dL — ABNORMAL HIGH (ref 70–99)
POTASSIUM: 4.3 meq/L (ref 3.7–5.3)
Potassium: 3.8 mEq/L (ref 3.7–5.3)
SODIUM: 146 meq/L (ref 137–147)
Sodium: 145 mEq/L (ref 137–147)

## 2014-04-25 LAB — CBC WITH DIFFERENTIAL/PLATELET
Basophils Absolute: 0 10*3/uL (ref 0.0–0.1)
Basophils Relative: 0 % (ref 0–1)
Eosinophils Absolute: 0 10*3/uL (ref 0.0–0.7)
Eosinophils Relative: 0 % (ref 0–5)
HCT: 32.2 % — ABNORMAL LOW (ref 36.0–46.0)
HEMOGLOBIN: 10.3 g/dL — AB (ref 12.0–15.0)
Lymphocytes Relative: 61 % — ABNORMAL HIGH (ref 12–46)
Lymphs Abs: 4.6 10*3/uL — ABNORMAL HIGH (ref 0.7–4.0)
MCH: 27.3 pg (ref 26.0–34.0)
MCHC: 32 g/dL (ref 30.0–36.0)
MCV: 85.4 fL (ref 78.0–100.0)
Monocytes Absolute: 0.3 10*3/uL (ref 0.1–1.0)
Monocytes Relative: 4 % (ref 3–12)
NEUTROS ABS: 2.6 10*3/uL (ref 1.7–7.7)
NEUTROS PCT: 35 % — AB (ref 43–77)
PLATELETS: 157 10*3/uL (ref 150–400)
RBC: 3.77 MIL/uL — AB (ref 3.87–5.11)
RDW: 13.2 % (ref 11.5–15.5)
WBC: 7.6 10*3/uL (ref 4.0–10.5)

## 2014-04-25 LAB — GLUCOSE, CAPILLARY
GLUCOSE-CAPILLARY: 67 mg/dL — AB (ref 70–99)
GLUCOSE-CAPILLARY: 105 mg/dL — AB (ref 70–99)
Glucose-Capillary: 113 mg/dL — ABNORMAL HIGH (ref 70–99)
Glucose-Capillary: 109 mg/dL — ABNORMAL HIGH (ref 70–99)

## 2014-04-25 LAB — HEMOGLOBIN A1C
Hgb A1c MFr Bld: 5.4 % (ref <5.7)
Mean Plasma Glucose: 108 mg/dL (ref <117)

## 2014-04-25 MED ORDER — DEXTROSE 50 % IV SOLN
INTRAVENOUS | Status: AC
  Filled 2014-04-25: qty 50

## 2014-04-25 MED ORDER — DEXTROSE 50 % IV SOLN
25.0000 mL | Freq: Once | INTRAVENOUS | Status: AC | PRN
  Administered 2014-04-25: 25 mL via INTRAVENOUS

## 2014-04-25 MED ORDER — BISACODYL 10 MG RE SUPP
10.0000 mg | Freq: Once | RECTAL | Status: AC
  Administered 2014-04-25: 10 mg via RECTAL
  Filled 2014-04-25: qty 1

## 2014-04-25 MED ORDER — INSULIN ASPART 100 UNIT/ML ~~LOC~~ SOLN
0.0000 [IU] | SUBCUTANEOUS | Status: DC
  Administered 2014-04-27 – 2014-04-28 (×3): 1 [IU] via SUBCUTANEOUS
  Administered 2014-04-29: 2 [IU] via SUBCUTANEOUS
  Administered 2014-04-29: 1 [IU] via SUBCUTANEOUS
  Administered 2014-04-29 – 2014-04-30 (×3): 2 [IU] via SUBCUTANEOUS
  Administered 2014-04-30 (×2): 1 [IU] via SUBCUTANEOUS
  Administered 2014-04-30: 2 [IU] via SUBCUTANEOUS
  Administered 2014-04-30 – 2014-05-03 (×17): 1 [IU] via SUBCUTANEOUS
  Administered 2014-05-03 – 2014-05-04 (×3): 2 [IU] via SUBCUTANEOUS
  Administered 2014-05-04 – 2014-05-05 (×3): 1 [IU] via SUBCUTANEOUS
  Administered 2014-05-05: 2 [IU] via SUBCUTANEOUS

## 2014-04-25 MED ORDER — HYDROMORPHONE HCL PF 1 MG/ML IJ SOLN: 0.5000 mg | INTRAMUSCULAR | Status: DC | PRN

## 2014-04-25 MED ORDER — DEXTROSE-NACL 5-0.9 % IV SOLN
INTRAVENOUS | Status: DC
  Administered 2014-04-25: 12:00:00 via INTRAVENOUS

## 2014-04-25 NOTE — H&P (Addendum)
[facility] PROGRESS NOTE  Erika Day HEN:[id number] DOB: 24-Jul-1930 DOA: 04/23/2014 PCP: Philis Fendt, MD  Assessment/Plan: SBO (small bowel obstruction)  - Continue NGT suction, n.p.o., IV fluids, currently on conservative management  KUB shows continued small bowel obstruction pattern, awaiting further general surgery recommendations - General surgery following, will defer to surgery for further management  KUB tomorrow morning Trial of Dulcolax once today?, Patient passing flatus but still has significant NG tube output. 1450cc over 12 hours, ambulate    HTN (hypertension), soft but stable Continue IV fluids, holding verapamil and lisinopril    DM (diabetes mellitus), type 2, hypoglycemic this morning - Continue sliding scale insulin , continue Accu-Cheks  Anxiety:  - Placed on Ativan as needed   DVT Prophylaxis: Lovenox  Code Status: Full code  Family Communication: Discussed with patient at the bedside  Disposition:  Consultants:  General surgery Procedures:  NGT suction Antibiotics:  None      HPI/Subjective: Patient states that she is passing flatus, denies abdominal pain  Objective: Filed Vitals:   04/25/14 0744  BP: 95/61  Pulse: 91  Temp: 97.2 F (36.2 C)  Resp: 16    Intake/Output Summary (Last 24 hours) at 04/25/14 1053 Last data filed at 04/25/14 1036  Gross per 24 hour  Intake 2275.42 ml  Output   1602 ml  Net 673.42 ml   Filed Weights   04/23/14 2102  Weight: 78.155 kg (172 lb 4.8 oz)    Exam: General: Alert and awake, oriented x3, not in any acute distress.  CVS: S1-S2 clear, no murmur rubs or gallops  Chest: clear to auscultation bilaterally, no wheezing, rales or rhonchi  Abdomen: soft nontender, distended, ventral hernia, hypoactive bowel sounds  Extremities: no cyanosis, clubbing or edema noted bilaterally  Neuro: Cranial nerves II-XII intact, no focal neurological deficits   Data Reviewed: Basic Metabolic  Panel:  Recent Labs Lab 04/23/14 1052 04/24/14 0421 04/25/14 0505  NA 139 141 146  K 5.0 4.4 3.8  CL 101 107 106  CO2 26 21 19   GLUCOSE 152* 74 51*  BUN 11 12 18   CREATININE 1.04 1.15* 1.20*  CALCIUM 10.5 9.3 9.5   Liver Function Tests:  Recent Labs Lab 04/23/14 1052 04/24/14 0421  AST 13 12  ALT 6 5  ALKPHOS 77 60  BILITOT 0.3 0.3  PROT 7.2 5.6*  ALBUMIN 3.7 2.8*    Recent Labs Lab 04/23/14 1052  LIPASE 34   No results found for this basename: AMMONIA,  in the last 168 hours CBC:  Recent Labs Lab 04/23/14 1052 04/24/14 0421 04/25/14 0505  WBC 7.7 10.2 7.6  NEUTROABS 3.7  --  2.6  HGB 12.4 10.5* 10.3*  HCT 37.5 32.1* 32.2*  MCV 85.0 85.4 85.4  PLT 160 147* 157   Cardiac Enzymes: No results found for this basename: CKTOTAL, CKMB, CKMBINDEX, TROPONINI,  in the last 168 hours BNP (last 3 results) No results found for this basename: PROBNP,  in the last 8760 hours CBG: No results found for this basename: GLUCAP,  in the last 168 hours  No results found for this or any previous visit (from the past 240 hour(s)).   Studies: Ct Abdomen Pelvis W Contrast  04/23/2014   CLINICAL DATA:  Epigastric pain and vomiting. History of cholecystectomy.  EXAM: CT ABDOMEN AND PELVIS WITH CONTRAST  TECHNIQUE: Multidetector CT imaging of the abdomen and pelvis was performed using the standard protocol following bolus administration of intravenous contrast.  CONTRAST:  116mL  OMNIPAQUE IOHEXOL 300 MG/ML  SOLN  COMPARISON:  08/17/2011  FINDINGS: Subsegmental atelectasis is present in the lung bases. 3 vessel coronary artery calcifications are noted.  The gallbladder is surgically absent. The liver, spleen, adrenal glands, kidneys, and pancreas have an unremarkable enhanced appearance. Mild dilatation of the common bile duct is unchanged and likely related to prior cholecystectomy.  There is new, moderate volume ascites. There is moderate dilatation of multiple fluid-filled small  bowel loops measuring up to approximately 4 cm in diameter. A small amount of pneumatosis is questioned in a small bowel loop in the right mid abdomen (series 2, images 37 and 39). The distal ileum and colon are largely decompressed. There is a small bowel containing ventral hernia, although contrast does pass beyond this, and the level of apparent small bowel obstruction is quite distal to this.  Sequelae of interval hysterectomy are identified. There is a partially calcified 4.8 x 3.9 cm soft tissue mass in the right hemipelvis. Cystic adnexal mass has been resected. There is soft tissue fullness in the peritoneum, stable to slightly more prominent than on the prior study. Right inguinal lymph nodes measure up to 1.3 cm in short axis, mildly increased in size compared to the prior study. Bladder is partially decompressed with possible mild bladder wall thickening. Advanced atherosclerotic calcification is noted of the abdominal aorta and common iliac arteries. Sclerosis about the right greater than left sacroiliac joints is unchanged. Advanced disc degeneration is seen throughout the lower thoracic and lumbar spine.  IMPRESSION: 1. High-grade small bowel obstruction with transition to decompressed distal small bowel in the right mid abdomen. Early pneumatosis is questioned in a small bowel loop in the right mid abdomen. 2. Interval hysterectomy with resection of the cystic mass described on the prior CT. Partially calcified soft tissue nodule is present in the posterior right pelvis, and this is concerning for a metastatic deposit. 3. Mild interval enlargement of right inguinal lymph nodes, nonspecific however nodal metastatic disease is possible. 4. Soft tissue fullness in the perineum, stable to slightly more prominent than on the prior study. This may reflect pelvic floor laxity, although clinical correlation is recommended to excluded metastatic disease in the low pelvis.   Electronically Signed   By: Logan Bores   On: 04/23/2014 12:50   Dg Abd 2 Views  04/25/2014   CLINICAL DATA:  Small bowel obstruction.  EXAM: ABDOMEN - 2 VIEW  COMPARISON:  04/24/2014  FINDINGS: NG tube tip is in the distal stomach. Small bowel dilatation in the mid abdomen, minimally improved since prior study. Small bowel diameter approximately 4-5 cm. No free air or organomegaly. Prior cholecystectomy.  Low lung volumes with bibasilar atelectasis.  IMPRESSION: Slight interval decreased distention of mid small bowel loops. Continued small bowel obstruction pattern.   Electronically Signed   By: Rolm Baptise M.D.   On: 04/25/2014 08:24   Dg Abd 2 Views  04/24/2014   CLINICAL DATA:  Abdominal pain.  Followup small bowel obstruction.  EXAM: ABDOMEN - 2 VIEW  COMPARISON:  04/23/2014  FINDINGS: Nasogastric tube is in adequate position with side port and tip over the stomach in the left upper quadrant. Examination demonstrates air and stool throughout the colon. There is persistence of several air-filled dilated small bowel loops in a few scattered air-fluid levels without significant overall change. There is no evidence of free peritoneal air. Contrast present over the bladder. Remainder of the exam is unchanged.  IMPRESSION: Persistent air-filled dilated small bowel loops  in the left mid abdomen without significant overall change compatible with known small bowel obstruction.  Nasogastric tube in adequate position.   Electronically Signed   By: Marin Olp M.D.   On: 04/24/2014 08:17   Dg Abd Portable 1v  04/23/2014   CLINICAL DATA:  Nasogastric tube placement, recently advanced.  EXAM: PORTABLE ABDOMEN - 1 VIEW  COMPARISON:  None.  FINDINGS: The nasogastric tube is blurred by motion artifact. Its tip is especially blurred. The tube is noted to extend down into the stomach body and possibly to the antrum, but is not visible distal to this. There appears to be an adequate amount of the tube within the stomach.  Thoracolumbar spondylosis and  degenerative disc disease.  Abnormal dilated small bowel, up to 4.4 cm, compatible with small bowel obstruction.  IMPRESSION: 1. Nasogastric tube tip is probably in the stomach body - the tip itself is obscured by motion artifact causing blurring. 2. Dilated bowel compatible with small bowel obstruction.   Electronically Signed   By: Sherryl Barters M.D.   On: 04/23/2014 23:55   Dg Abd Portable 1v  04/23/2014   CLINICAL DATA:  Tube placement.  EXAM: PORTABLE ABDOMEN - 1 VIEW  COMPARISON:  CT 04/23/2014.  Abdomen 08/17/2011  FINDINGS: An enteric tube is been placed. The tip is at the left medial costophrenic angle consistent with location at the EG junction. Proximal side hole is probably in the distal esophagus. Visualized small bowel demonstrate mild gaseous distention. Residual contrast material in the bladder.  IMPRESSION: Negative.   Electronically Signed   By: Lucienne Capers M.D.   On: 04/23/2014 22:36    Scheduled Meds: . antiseptic oral rinse  15 mL Mouth Rinse BID  . bisacodyl  10 mg Rectal Once  . enoxaparin (LOVENOX) injection  30 mg Subcutaneous Q24H   Continuous Infusions: . sodium chloride 100 mL/hr at 04/25/14 2297    Principal Problem:   SBO (small bowel obstruction) Active Problems:   HTN (hypertension), benign   DM (diabetes mellitus), type 2, uncontrolled   Abdominal pain, unspecified site    Time spent: 30 minutes   [facility] Pager [phone] If 7PM-7AM, please contact night-coverage at [email or web address], password [facility] 04/25/2014, 10:53 AM  LOS: 2 days

## 2014-04-25 NOTE — Progress Notes (Signed)
[facility] Progress Note     Subjective: Continues to improve, says distension is much improved.  No N/V.  Having flatus and had a BM yesterday without suppositories/enemas.  Mobilizing to the chair with PT.  Hungry/thirsty.    Objective: Vital signs in last 24 hours: Temp:  [97.2 F (36.2 C)-97.8 F (36.6 C)] 97.2 F (36.2 C) (06/24 0744) Pulse Rate:  [79-91] 91 (06/24 0744) Resp:  [16-17] 16 (06/24 0744) BP: (95-130)/(61-72) 95/61 mmHg (06/24 0744) SpO2:  [92 %-95 %] 92 % (06/24 0744) Last BM Date: 04/23/14  Intake/Output from previous day: 06/23 0701 - 06/24 0700 In: 2275.4 [P.O.:120; I.V.:2155.4] Out: 1450 [Emesis/NG output:1450] Intake/Output this shift: Total I/O In: -  Out: 152 [Urine:150; Stool:2]  PE: Gen:  Alert, NAD, pleasant Abd: Soft, ND, NT, +BS, no HSM   Lab Results:   Recent Labs  04/24/14 0421 04/25/14 0505  WBC 10.2 7.6  HGB 10.5* 10.3*  HCT 32.1* 32.2*  PLT 147* 157   BMET  Recent Labs  04/24/14 0421 04/25/14 0505  NA 141 146  K 4.4 3.8  CL 107 106  CO2 21 19  GLUCOSE 74 51*  BUN 12 18  CREATININE 1.15* 1.20*  CALCIUM 9.3 9.5   PT/INR No results found for this basename: LABPROT, INR,  in the last 72 hours CMP     Component Value Date/Time   NA 146 04/25/2014 0505   K 3.8 04/25/2014 0505   CL 106 04/25/2014 0505   CO2 19 04/25/2014 0505   GLUCOSE 51* 04/25/2014 0505   BUN 18 04/25/2014 0505   CREATININE 1.20* 04/25/2014 0505   CALCIUM 9.5 04/25/2014 0505   PROT 5.6* 04/24/2014 0421   ALBUMIN 2.8* 04/24/2014 0421   AST 12 04/24/2014 0421   ALT 5 04/24/2014 0421   ALKPHOS 60 04/24/2014 0421   BILITOT 0.3 04/24/2014 0421   GFRNONAA 41* 04/25/2014 0505   GFRAA 47* 04/25/2014 0505   Lipase     Component Value Date/Time   LIPASE 34 04/23/2014 1052       Studies/Results: Ct Abdomen Pelvis W Contrast  04/23/2014   CLINICAL DATA:  Epigastric pain and vomiting. History of cholecystectomy.  EXAM: CT ABDOMEN AND PELVIS  WITH CONTRAST  TECHNIQUE: Multidetector CT imaging of the abdomen and pelvis was performed using the standard protocol following bolus administration of intravenous contrast.  CONTRAST:  146mL OMNIPAQUE IOHEXOL 300 MG/ML  SOLN  COMPARISON:  08/17/2011  FINDINGS: Subsegmental atelectasis is present in the lung bases. 3 vessel coronary artery calcifications are noted.  The gallbladder is surgically absent. The liver, spleen, adrenal glands, kidneys, and pancreas have an unremarkable enhanced appearance. Mild dilatation of the common bile duct is unchanged and likely related to prior cholecystectomy.  There is new, moderate volume ascites. There is moderate dilatation of multiple fluid-filled small bowel loops measuring up to approximately 4 cm in diameter. A small amount of pneumatosis is questioned in a small bowel loop in the right mid abdomen (series 2, images 37 and 39). The distal ileum and colon are largely decompressed. There is a small bowel containing ventral hernia, although contrast does pass beyond this, and the level of apparent small bowel obstruction is quite distal to this.  Sequelae of interval hysterectomy are identified. There is a partially calcified 4.8 x 3.9 cm soft tissue mass in the right hemipelvis. Cystic adnexal mass has been resected. There is soft tissue fullness in the peritoneum, stable to slightly more prominent than on the  prior study. Right inguinal lymph nodes measure up to 1.3 cm in short axis, mildly increased in size compared to the prior study. Bladder is partially decompressed with possible mild bladder wall thickening. Advanced atherosclerotic calcification is noted of the abdominal aorta and common iliac arteries. Sclerosis about the right greater than left sacroiliac joints is unchanged. Advanced disc degeneration is seen throughout the lower thoracic and lumbar spine.  IMPRESSION: 1. High-grade small bowel obstruction with transition to decompressed distal small bowel in the  right mid abdomen. Early pneumatosis is questioned in a small bowel loop in the right mid abdomen. 2. Interval hysterectomy with resection of the cystic mass described on the prior CT. Partially calcified soft tissue nodule is present in the posterior right pelvis, and this is concerning for a metastatic deposit. 3. Mild interval enlargement of right inguinal lymph nodes, nonspecific however nodal metastatic disease is possible. 4. Soft tissue fullness in the perineum, stable to slightly more prominent than on the prior study. This may reflect pelvic floor laxity, although clinical correlation is recommended to excluded metastatic disease in the low pelvis.   Electronically Signed   By: Logan Bores   On: 04/23/2014 12:50   Dg Abd 2 Views  04/25/2014   CLINICAL DATA:  Small bowel obstruction.  EXAM: ABDOMEN - 2 VIEW  COMPARISON:  04/24/2014  FINDINGS: NG tube tip is in the distal stomach. Small bowel dilatation in the mid abdomen, minimally improved since prior study. Small bowel diameter approximately 4-5 cm. No free air or organomegaly. Prior cholecystectomy.  Low lung volumes with bibasilar atelectasis.  IMPRESSION: Slight interval decreased distention of mid small bowel loops. Continued small bowel obstruction pattern.   Electronically Signed   By: Rolm Baptise M.D.   On: 04/25/2014 08:24   Dg Abd 2 Views  04/24/2014   CLINICAL DATA:  Abdominal pain.  Followup small bowel obstruction.  EXAM: ABDOMEN - 2 VIEW  COMPARISON:  04/23/2014  FINDINGS: Nasogastric tube is in adequate position with side port and tip over the stomach in the left upper quadrant. Examination demonstrates air and stool throughout the colon. There is persistence of several air-filled dilated small bowel loops in a few scattered air-fluid levels without significant overall change. There is no evidence of free peritoneal air. Contrast present over the bladder. Remainder of the exam is unchanged.  IMPRESSION: Persistent air-filled dilated  small bowel loops in the left mid abdomen without significant overall change compatible with known small bowel obstruction.  Nasogastric tube in adequate position.   Electronically Signed   By: Marin Olp M.D.   On: 04/24/2014 08:17   Dg Abd Portable 1v  04/23/2014   CLINICAL DATA:  Nasogastric tube placement, recently advanced.  EXAM: PORTABLE ABDOMEN - 1 VIEW  COMPARISON:  None.  FINDINGS: The nasogastric tube is blurred by motion artifact. Its tip is especially blurred. The tube is noted to extend down into the stomach body and possibly to the antrum, but is not visible distal to this. There appears to be an adequate amount of the tube within the stomach.  Thoracolumbar spondylosis and degenerative disc disease.  Abnormal dilated small bowel, up to 4.4 cm, compatible with small bowel obstruction.  IMPRESSION: 1. Nasogastric tube tip is probably in the stomach body - the tip itself is obscured by motion artifact causing blurring. 2. Dilated bowel compatible with small bowel obstruction.   Electronically Signed   By: Sherryl Barters M.D.   On: 04/23/2014 23:55   Dg Abd  Portable 1v  04/23/2014   CLINICAL DATA:  Tube placement.  EXAM: PORTABLE ABDOMEN - 1 VIEW  COMPARISON:  CT 04/23/2014.  Abdomen 08/17/2011  FINDINGS: An enteric tube is been placed. The tip is at the left medial costophrenic angle consistent with location at the EG junction. Proximal side hole is probably in the distal esophagus. Visualized small bowel demonstrate mild gaseous distention. Residual contrast material in the bladder.  IMPRESSION: Negative.   Electronically Signed   By: Lucienne Capers M.D.   On: 04/23/2014 22:36    Anti-infectives: Anti-infectives   None       Assessment/Plan 1. PSBO  2. Ventral hernia that is reducible - This is not source of SBO  3. History of stroke  4. HTN  5. Diabetes mellitus - Glucose - 74 - 04/24/2014  6. Mildly elevated creatinine  7. Limited physical activity prior to admission 8.  Start DVT prophylaxis - Lovenox  Plan: 1. Films improved today, but NG output still feculent and output is high at 1450.  Clinically, her abdomen is soft, her pain is nearly resolved and no N/V, has had a BM, and is hungry/thirsty. 2. Cont NGT and conservative management for now. Consider d/c of NG tomorrow if NG output improved and clinically improved 3. OOB to chair - PT on board 4.  Hopefully she will not need surgical intervention, but if she is worse tomorrow or not progressing Dr. Donne Hazel would recommend surgery.   LOS: 1 day     LOS: 2 days    DORT, Jinny Blossom 04/25/2014, 10:47 AM Pager: [phone]

## 2014-04-25 NOTE — Progress Notes (Signed)
Agree with above, clinically better, films a little better, having bowel function, ng output still high, hopefully will resolve conservatively.

## 2014-04-25 NOTE — Progress Notes (Signed)
Hypoglycemic Event  CBG: 67  Treatment: D50 IV 25 mL  Symptoms: None  Follow-up CBG: Time: 12:21 CBG Result:113  Possible Reasons for Event: NPO  Comments/MD notified: MD changed fluids to D5NS.    BRAKE, ANDREW R  Remember to initiate Hypoglycemia Order Set & complete

## 2014-04-25 NOTE — Evaluation (Signed)
Physical Therapy Evaluation Patient Details Name: Erika Day MRN: [id number] DOB: 08-03-1930 Today's Date: 04/25/2014   History of Present Illness  Erika Day is a 80 y.o. female with a past medical history of CVA, type 2 diabetes, hypertension, dyslipidemia, history of ovarian mass, small bowel obstruction, status post exploratory laparotomy in 2012 performed at [facility], poor functional status, presenting to the emergency department with complaints of abdominal pain, nausea and vomiting.  Clinical Impression  Ms. Mallen appears weak, but she is able to walk with RW. She will benefit from OT and continued PT at home.  She is requesting home health aide to assist with her self care at home    Follow Up Recommendations Home health PT;Other (comment) (home health aide)    Equipment Recommendations  None recommended by PT    Recommendations for Other Services OT consult     Precautions / Restrictions Precautions Precaution Comments: NG tube Restrictions Weight Bearing Restrictions: No      Mobility  Bed Mobility Overal bed mobility: Needs Assistance Bed Mobility: Supine to Sit     Supine to sit: Min assist     General bed mobility comments: pt moves slowly  Transfers Overall transfer level: Needs assistance Equipment used: Rolling walker (2 wheeled) Transfers: Sit to/from Stand Sit to Stand: Min assist         General transfer comment: pt uses one hand on walker and pushes up with one hand. moves slowly and appears weak  Ambulation/Gait Ambulation/Gait assistance: Min assist Ambulation Distance (Feet): 12 Feet Assistive device: Rolling walker (2 wheeled) Gait Pattern/deviations: Trunk flexed;Decreased step length - right;Decreased step length - left;Shuffle Gait velocity: decreased Gait velocity interpretation: <1.8 ft/sec, indicative of risk for recurrent falls    Stairs            Wheelchair Mobility    Modified Rankin (Stroke Patients Only)        Balance Overall balance assessment: Modified Independent;History of Falls                                           Pertinent Vitals/Pain No c/o pain    Home Living Family/patient expects to be discharged to:: Private residence Living Arrangements: Spouse/significant other;Children (husband and daughter who is just going to be starting to work...pt stays she may need to look into getting an assistant) Available Help at Discharge: Family Type of Home: House Home Access: Stairs to enter Entrance Stairs-Rails: Right;Left (daughter reports house is on a hill) Technical brewer of Steps: [place]: One level Home Equipment: Wheelchair - [employer] - 2 wheels Additional Comments: Pt reports she has limited household amubulation and holds onto furniture at times too    Prior Function Level of Independence: Needs assistance   Gait / Transfers Assistance Needed: pt reports she has to hold onto something all the time           Hand Dominance   Dominant Hand: Right    Extremity/Trunk Assessment               Lower Extremity Assessment: Generalized weakness      Cervical / Trunk Assessment: Kyphotic (pt reports all her weakness "comes from my back")  Communication   Communication: No difficulties  Cognition Arousal/Alertness: Awake/alert Behavior During Therapy: WFL for tasks assessed/performed Overall Cognitive Status: Within Functional Limits for tasks assessed  General Comments General comments (skin integrity, edema, etc.): kyphotic posture in sitting and standing    Exercises        Assessment/Plan    PT Assessment Patient needs continued PT services  PT Diagnosis Difficulty walking;Generalized weakness;Abnormality of gait   PT Problem List Decreased strength;Decreased activity tolerance;Decreased mobility  PT Treatment Interventions Gait training;Functional mobility training;Therapeutic  activities;Therapeutic exercise;Patient/family education   PT Goals (Current goals can be found in the Care Plan section) Acute Rehab PT Goals Patient Stated Goal: to get stronger so that she can stop using her wheelchair at home PT Goal Formulation: With patient/family Time For Goal Achievement: 05/09/14 Potential to Achieve Goals: Good    Frequency Min 3X/week   Barriers to discharge Decreased caregiver support      Co-evaluation               End of Session   Activity Tolerance: Patient limited by fatigue Patient left: in chair;with family/visitor present Nurse Communication: Mobility status         Time: 4536-4680 PT Time Calculation (min): 27 min   Charges:         PT G Codes:          Norwood Levo 04/25/2014, 9:40 AM

## 2014-04-26 ENCOUNTER — Inpatient Hospital Stay (HOSPITAL_COMMUNITY): Payer: PRIVATE HEALTH INSURANCE

## 2014-04-26 LAB — CBC
HCT: 30.8 % — ABNORMAL LOW (ref 36.0–46.0)
Hemoglobin: 10 g/dL — ABNORMAL LOW (ref 12.0–15.0)
MCH: 28.3 pg (ref 26.0–34.0)
MCHC: 32.5 g/dL (ref 30.0–36.0)
MCV: 87.3 fL (ref 78.0–100.0)
Platelets: 135 10*3/uL — ABNORMAL LOW (ref 150–400)
RBC: 3.53 MIL/uL — AB (ref 3.87–5.11)
RDW: 13.6 % (ref 11.5–15.5)
WBC: 7.9 10*3/uL (ref 4.0–10.5)

## 2014-04-26 LAB — GLUCOSE, CAPILLARY
GLUCOSE-CAPILLARY: 123 mg/dL — AB (ref 70–99)
GLUCOSE-CAPILLARY: 90 mg/dL (ref 70–99)
GLUCOSE-CAPILLARY: 71 mg/dL (ref 70–99)
Glucose-Capillary: 125 mg/dL — ABNORMAL HIGH (ref 70–99)
Glucose-Capillary: 69 mg/dL — ABNORMAL LOW (ref 70–99)
Glucose-Capillary: 77 mg/dL (ref 70–99)
Glucose-Capillary: 81 mg/dL (ref 70–99)

## 2014-04-26 MED ORDER — SODIUM CHLORIDE 0.9 % IV SOLN
INTRAVENOUS | Status: DC
  Administered 2014-04-26: 14:00:00 via INTRAVENOUS

## 2014-04-26 MED ORDER — DEXTROSE 50 % IV SOLN
INTRAVENOUS | Status: DC
Start: 2014-04-26 — End: 2014-04-26
  Filled 2014-04-26: qty 50

## 2014-04-26 NOTE — Progress Notes (Signed)
Patient ID: Erika Day, female   DOB: [birth year], 80 y.o.   MRN: [id number]    Subjective: Pt feels better.  Some flatus and 2 small BMs yesterday after a suppository  Objective: Vital signs in last 24 hours: Temp:  [97.4 F (36.3 C)-98.1 F (36.7 C)] 98.1 F (36.7 C) 05-04-2023 0618) Pulse Rate:  [66-86] 66 05-04-23 0618) Resp:  [16-18] 18 04-May-2023 0618) BP: (110-124)/(64-69) 118/68 mmHg 05/04/2023 0618) SpO2:  [96 %-98 %] 96 % 05/04/23 0618) Weight:  [169 lb 8.5 oz (76.9 kg)] 169 lb 8.5 oz (76.9 kg) (06/24 2212) Last BM Date: 03-May-2014  Intake/Output from previous day: 06/24 0701 - 04-May-2023 0700 In: -  Out: 853 [Urine:150; Emesis/NG output:700; Stool:3] Intake/Output this shift:    PE: Abd: soft, NT, ND, hernia reduces, NGT output with smaller amount and now bilious not feculent  Lab Results:   Recent Labs  04/25/14 0505 05-03-2014 0518  WBC 7.6 7.9  HGB 10.3* 10.0*  HCT 32.2* 30.8*  PLT 157 135*   BMET  Recent Labs  04/25/14 0505 04/25/14 1143  NA 146 145  K 3.8 4.3  CL 106 106  CO2 19 20  GLUCOSE 51* 134*  BUN 18 19  CREATININE 1.20* 1.18*  CALCIUM 9.5 9.5   PT/INR No results found for this basename: LABPROT, INR,  in the last 72 hours CMP     Component Value Date/Time   NA 145 04/25/2014 1143   K 4.3 04/25/2014 1143   CL 106 04/25/2014 1143   CO2 20 04/25/2014 1143   GLUCOSE 134* 04/25/2014 1143   BUN 19 04/25/2014 1143   CREATININE 1.18* 04/25/2014 1143   CALCIUM 9.5 04/25/2014 1143   PROT 5.6* 04/24/2014 0421   ALBUMIN 2.8* 04/24/2014 0421   AST 12 04/24/2014 0421   ALT 5 04/24/2014 0421   ALKPHOS 60 04/24/2014 0421   BILITOT 0.3 04/24/2014 0421   GFRNONAA 42* 04/25/2014 1143   GFRAA 48* 04/25/2014 1143   Lipase     Component Value Date/Time   LIPASE 34 04/23/2014 1052       Studies/Results: Dg Abd 2 Views  2014-05-03   CLINICAL DATA:  Abdominal pain, small bowel obstruction  EXAM: ABDOMEN - 2 VIEW  COMPARISON:  04/25/2014  FINDINGS: Nasogastric tube is  appropriately positioned. Slight decrease in gaseous prominence of multiple central loops of small bowel. Maximal diameter 3.4 cm compared to 4.6 cm previously. Several presumed colonic air-fluid levels are noted at decubitus imaging, with several differential small bowel air-fluid levels. Extensive degenerative change of the lumbar spine reidentified. Ingested material or metallic foreign bodies are reidentified over the pelvis. Pelvic phleboliths versus vascular calcifications are again noted. Cholecystectomy clips are noted.  IMPRESSION: Improving small bowel obstruction with decrease in caliber of small bowel dilatation.   Electronically Signed   By: Conchita Paris M.D.   On: 03-May-2014 08:16   Dg Abd 2 Views  04/25/2014   CLINICAL DATA:  Small bowel obstruction.  EXAM: ABDOMEN - 2 VIEW  COMPARISON:  04/24/2014  FINDINGS: NG tube tip is in the distal stomach. Small bowel dilatation in the mid abdomen, minimally improved since prior study. Small bowel diameter approximately 4-5 cm. No free air or organomegaly. Prior cholecystectomy.  Low lung volumes with bibasilar atelectasis.  IMPRESSION: Slight interval decreased distention of mid small bowel loops. Continued small bowel obstruction pattern.   Electronically Signed   By: Rolm Baptise M.D.   On: 04/25/2014 08:24    Anti-infectives: Anti-infectives  None       Assessment/Plan  1. PSBO  Plan:  1. Patient's films appear improved with more gas in colon.  The distention of her small bowel appears lessened as well. 2. Will clamp NGT today and see how she does, hopefully avoiding operative intervention.  D/w patient and daughter   LOS: 3 days    Jayel Scaduto E 04/26/2014, 8:32 AM Pager: [phone]

## 2014-04-26 NOTE — Progress Notes (Signed)
Agree with above 

## 2014-04-26 NOTE — Progress Notes (Signed)
INITIAL NUTRITION ASSESSMENT  DOCUMENTATION CODES Per approved criteria  -Severe malnutrition in the context of acute illness or injury -Obesity Unspecified   INTERVENTION: Recommend Resource Breeze po BID, each supplement provides 250 kcal and 9 grams of protein, once diet order permits. RD to continue to follow nutrition care plan.  NUTRITION DIAGNOSIS: Inadequate oral intake related to altered GI function as evidenced by need for bowel rest and slow diet advancement.   Goal: Intake to meet >90% of estimated nutrition needs.  Monitor:  weight trends, lab trends, I/O's, PO intake, supplement tolerance  Reason for Assessment: Malnutrition Screening Tool  80 y.o. female  Admitting Dx: SBO (small bowel obstruction)  ASSESSMENT: PMHx significant for CVA, DM2, HTN, HLD, hx of ovarian mass, SBO. Admitted with abdominal pain, n/v x 1 month. Work-up reveals SBO.  Surgery consulted and recommending NGT for decompression with bowel rest. NGT clamped this morning, team hoping to avoid surgical intervention. Pt reports that her belly feels "okay" and she feels weak from not having eaten anything.  Patient with approximately 9% wt loss x 1 month, per patient report. She hasn't had anything to eat since Sunday (5 days ago.)  She states that when she does eat, she eats minimally. For example, she might just have two bowls of grits in the morning with cheese and that would be all she would eat in one day. She states that she drinks water and gingerale all day long, and that she also really enjoys fruit juices. Reports that her highest weight in her lifetime was around 285 lb and weighed that just a couple of years ago.  Nutrition Focused Physical Exam:  Subcutaneous Fat:  Orbital Region: WNL Upper Arm Region: moderate depletion Thoracic and Lumbar Region: WNL  Muscle:  Temple Region: moderate depletion Clavicle Bone Region: moderate depletion Clavicle and Acromion Bone Region:  WNL Scapular Bone Region: n/a Dorsal Hand: WNL Patellar Region: WNL Anterior Thigh Region: mild depletion Posterior Calf Region: WNL  Edema: none  Pt meets criteria for severe MALNUTRITION in the context of acute illness as evidenced by intake <50% x at least 5 days, 9% wt loss x 1 month and moderate fat/muscle mass loss.  CBG's: 77 - 123  Height: Ht Readings from Last 1 Encounters:  09/06/11 4' 11.8" (1.519 m)    Weight: Wt Readings from Last 1 Encounters:  04/25/14 169 lb 8.5 oz (76.9 kg)    Ideal Body Weight: 100 lb  % Ideal Body Weight: 169%  Wt Readings from Last 10 Encounters:  04/25/14 169 lb 8.5 oz (76.9 kg)  09/06/11 195 lb 1.7 oz (88.5 kg)    Usual Body Weight: 186 lb - states she weighed this last month at the doctor's office  % Usual Body Weight: 91%  BMI:  Body mass index is 33.33 kg/(m^2). Obese Class I  Estimated Nutritional Needs: Kcal: 1350 - 1500 Protein: 70 - 80 g Fluid: at least 1.5 liters  Skin: intact  Diet Order: NPO  EDUCATION NEEDS: -Education not appropriate at this time   Intake/Output Summary (Last 24 hours) at 04/26/14 1536 Last data filed at 04/26/14 1507  Gross per 24 hour  Intake    480 ml  Output    701 ml  Net   -221 ml    Last BM: 6/25  Labs:   Recent Labs Lab 04/24/14 0421 04/25/14 0505 04/25/14 1143  NA 141 146 145  K 4.4 3.8 4.3  CL 107 106 106  CO2 21 19 20  BUN 12 18 19   CREATININE 1.15* 1.20* 1.18*  CALCIUM 9.3 9.5 9.5  GLUCOSE 74 51* 134*    CBG (last 3)   Recent Labs  04/26/14 0434 04/26/14 0804 04/26/14 1151  GLUCAP 123* 90 77    Scheduled Meds: . antiseptic oral rinse  15 mL Mouth Rinse BID  . enoxaparin (LOVENOX) injection  30 mg Subcutaneous Q24H  . insulin aspart  0-9 Units Subcutaneous 6 times per day    Continuous Infusions: . sodium chloride 75 mL/hr at 04/26/14 1354    Past Medical History  Diagnosis Date  . Stroke   . Acid reflux disease   . Panic attacks   .  Hypertension   . DDD (degenerative disc disease), lumbar   . Dyslipidemia   . Degenerative joint disease     Past Surgical History  Procedure Laterality Date  . Cholecystectomy    . Abdominal hysterectomy      Inda Coke MS, RD, LDN Inpatient Registered Dietitian Pager: [phone] After-hours pager: [phone]

## 2014-04-26 NOTE — Progress Notes (Signed)
[facility] PROGRESS NOTE  GLENDENE WYER WNI:[id number] DOB: 1930/01/01 DOA: 04/23/2014 PCP: Philis Fendt, MD  Assessment/Plan: Principal Problem:   SBO (small bowel obstruction) Active Problems:   HTN (hypertension), benign   DM (diabetes mellitus), type 2, uncontrolled   Abdominal pain, unspecified site    SBO (small bowel obstruction)  -Clamp NGT suction, n.p.o except ice chips., Continue IV fluids, currently on conservative management  KUB shows continued improvement, awaiting further general surgery recommendations  - General surgery following, will defer to surgery for further management  Trial of Dulcolax once today?, Patient passing flatus declining NG tube output.  Encouraged to ambulate  HTN (hypertension), soft but stable  Continue IV fluids, holding verapamil and lisinopril    DM (diabetes mellitus), type 2, hypoglycemic yesterday started on D5 normal saline Can discontinue D5 today and monitor CBG  - Continue sliding scale insulin , continue Accu-Cheks   Anxiety:  - Placed on Ativan as needed   DVT Prophylaxis: Lovenox  Code Status: Full code  Family Communication: Discussed with patient and daughter at the bedside  Disposition: Depends on surgical recommendations  Consultants:  General surgery Procedures:  NGT suction Antibiotics:  None    HPI/Subjective: Pt feels better. Some flatus and 2 small BMs yesterday after a suppository   Objective: Filed Vitals:   04/25/14 1638 04/25/14 2212 04/26/14 0618 04/26/14 1001  BP: 124/69 110/64 118/68 146/74  Pulse: 82 86 66 65  Temp: 97.4 F (36.3 C) 97.8 F (36.6 C) 98.1 F (36.7 C) 98.1 F (36.7 C)  TempSrc: Oral Oral Oral Oral  Resp: 16 18 18 18   Weight:  76.9 kg (169 lb 8.5 oz)    SpO2: 97% 98% 96% 96%    Intake/Output Summary (Last 24 hours) at 04/26/14 1026 Last data filed at 04/26/14 0900  Gross per 24 hour  Intake      0 ml  Output    752 ml  Net   -752 ml    Exam:  General:  Alert and awake, oriented x3, not in any acute distress.  CVS: S1-S2 clear, no murmur rubs or gallops  Chest: clear to auscultation bilaterally, no wheezing, rales or rhonchi  Abdomen: soft nontender, distended, ventral hernia, hypoactive bowel sounds  Extremities: no cyanosis, clubbing or edema noted bilaterally  Neuro: Cranial nerves II-XII intact, no focal neurological deficits    Data Reviewed: Basic Metabolic Panel:  Recent Labs Lab 04/23/14 1052 04/24/14 0421 04/25/14 0505 04/25/14 1143  NA 139 141 146 145  K 5.0 4.4 3.8 4.3  CL 101 107 106 106  CO2 26 21 19 20   GLUCOSE 152* 74 51* 134*  BUN 11 12 18 19   CREATININE 1.04 1.15* 1.20* 1.18*  CALCIUM 10.5 9.3 9.5 9.5    Liver Function Tests:  Recent Labs Lab 04/23/14 1052 04/24/14 0421  AST 13 12  ALT 6 5  ALKPHOS 77 60  BILITOT 0.3 0.3  PROT 7.2 5.6*  ALBUMIN 3.7 2.8*    Recent Labs Lab 04/23/14 1052  LIPASE 34   No results found for this basename: AMMONIA,  in the last 168 hours  CBC:  Recent Labs Lab 04/23/14 1052 04/24/14 0421 04/25/14 0505 04/26/14 0518  WBC 7.7 10.2 7.6 7.9  NEUTROABS 3.7  --  2.6  --   HGB 12.4 10.5* 10.3* 10.0*  HCT 37.5 32.1* 32.2* 30.8*  MCV 85.0 85.4 85.4 87.3  PLT 160 147* 157 135*    Cardiac Enzymes: No results found for this  basename: CKTOTAL, CKMB, CKMBINDEX, TROPONINI,  in the last 168 hours BNP (last 3 results) No results found for this basename: PROBNP,  in the last 8760 hours   CBG:  Recent Labs Lab 04/25/14 1634 04/25/14 2010 04/26/14 0026 04/26/14 0434 04/26/14 0804  GLUCAP 105* 109* 125* 123* 90    No results found for this or any previous visit (from the past 240 hour(s)).   Studies: Ct Abdomen Pelvis W Contrast  04/23/2014   CLINICAL DATA:  Epigastric pain and vomiting. History of cholecystectomy.  EXAM: CT ABDOMEN AND PELVIS WITH CONTRAST  TECHNIQUE: Multidetector CT imaging of the abdomen and pelvis was performed using the standard  protocol following bolus administration of intravenous contrast.  CONTRAST:  122mL OMNIPAQUE IOHEXOL 300 MG/ML  SOLN  COMPARISON:  08/17/2011  FINDINGS: Subsegmental atelectasis is present in the lung bases. 3 vessel coronary artery calcifications are noted.  The gallbladder is surgically absent. The liver, spleen, adrenal glands, kidneys, and pancreas have an unremarkable enhanced appearance. Mild dilatation of the common bile duct is unchanged and likely related to prior cholecystectomy.  There is new, moderate volume ascites. There is moderate dilatation of multiple fluid-filled small bowel loops measuring up to approximately 4 cm in diameter. A small amount of pneumatosis is questioned in a small bowel loop in the right mid abdomen (series 2, images 37 and 39). The distal ileum and colon are largely decompressed. There is a small bowel containing ventral hernia, although contrast does pass beyond this, and the level of apparent small bowel obstruction is quite distal to this.  Sequelae of interval hysterectomy are identified. There is a partially calcified 4.8 x 3.9 cm soft tissue mass in the right hemipelvis. Cystic adnexal mass has been resected. There is soft tissue fullness in the peritoneum, stable to slightly more prominent than on the prior study. Right inguinal lymph nodes measure up to 1.3 cm in short axis, mildly increased in size compared to the prior study. Bladder is partially decompressed with possible mild bladder wall thickening. Advanced atherosclerotic calcification is noted of the abdominal aorta and common iliac arteries. Sclerosis about the right greater than left sacroiliac joints is unchanged. Advanced disc degeneration is seen throughout the lower thoracic and lumbar spine.  IMPRESSION: 1. High-grade small bowel obstruction with transition to decompressed distal small bowel in the right mid abdomen. Early pneumatosis is questioned in a small bowel loop in the right mid abdomen. 2.  Interval hysterectomy with resection of the cystic mass described on the prior CT. Partially calcified soft tissue nodule is present in the posterior right pelvis, and this is concerning for a metastatic deposit. 3. Mild interval enlargement of right inguinal lymph nodes, nonspecific however nodal metastatic disease is possible. 4. Soft tissue fullness in the perineum, stable to slightly more prominent than on the prior study. This may reflect pelvic floor laxity, although clinical correlation is recommended to excluded metastatic disease in the low pelvis.   Electronically Signed   By: Logan Bores   On: 04/23/2014 12:50   Dg Abd 2 Views  04/26/2014   CLINICAL DATA:  Abdominal pain, small bowel obstruction  EXAM: ABDOMEN - 2 VIEW  COMPARISON:  04/25/2014  FINDINGS: Nasogastric tube is appropriately positioned. Slight decrease in gaseous prominence of multiple central loops of small bowel. Maximal diameter 3.4 cm compared to 4.6 cm previously. Several presumed colonic air-fluid levels are noted at decubitus imaging, with several differential small bowel air-fluid levels. Extensive degenerative change of the lumbar spine reidentified.  Ingested material or metallic foreign bodies are reidentified over the pelvis. Pelvic phleboliths versus vascular calcifications are again noted. Cholecystectomy clips are noted.  IMPRESSION: Improving small bowel obstruction with decrease in caliber of small bowel dilatation.   Electronically Signed   By: Conchita Paris M.D.   On: 04/26/2014 08:16   Dg Abd 2 Views  04/25/2014   CLINICAL DATA:  Small bowel obstruction.  EXAM: ABDOMEN - 2 VIEW  COMPARISON:  04/24/2014  FINDINGS: NG tube tip is in the distal stomach. Small bowel dilatation in the mid abdomen, minimally improved since prior study. Small bowel diameter approximately 4-5 cm. No free air or organomegaly. Prior cholecystectomy.  Low lung volumes with bibasilar atelectasis.  IMPRESSION: Slight interval decreased  distention of mid small bowel loops. Continued small bowel obstruction pattern.   Electronically Signed   By: Rolm Baptise M.D.   On: 04/25/2014 08:24   Dg Abd 2 Views  04/24/2014   CLINICAL DATA:  Abdominal pain.  Followup small bowel obstruction.  EXAM: ABDOMEN - 2 VIEW  COMPARISON:  04/23/2014  FINDINGS: Nasogastric tube is in adequate position with side port and tip over the stomach in the left upper quadrant. Examination demonstrates air and stool throughout the colon. There is persistence of several air-filled dilated small bowel loops in a few scattered air-fluid levels without significant overall change. There is no evidence of free peritoneal air. Contrast present over the bladder. Remainder of the exam is unchanged.  IMPRESSION: Persistent air-filled dilated small bowel loops in the left mid abdomen without significant overall change compatible with known small bowel obstruction.  Nasogastric tube in adequate position.   Electronically Signed   By: Marin Olp M.D.   On: 04/24/2014 08:17   Dg Abd Portable 1v  04/23/2014   CLINICAL DATA:  Nasogastric tube placement, recently advanced.  EXAM: PORTABLE ABDOMEN - 1 VIEW  COMPARISON:  None.  FINDINGS: The nasogastric tube is blurred by motion artifact. Its tip is especially blurred. The tube is noted to extend down into the stomach body and possibly to the antrum, but is not visible distal to this. There appears to be an adequate amount of the tube within the stomach.  Thoracolumbar spondylosis and degenerative disc disease.  Abnormal dilated small bowel, up to 4.4 cm, compatible with small bowel obstruction.  IMPRESSION: 1. Nasogastric tube tip is probably in the stomach body - the tip itself is obscured by motion artifact causing blurring. 2. Dilated bowel compatible with small bowel obstruction.   Electronically Signed   By: Sherryl Barters M.D.   On: 04/23/2014 23:55   Dg Abd Portable 1v  04/23/2014   CLINICAL DATA:  Tube placement.  EXAM:  PORTABLE ABDOMEN - 1 VIEW  COMPARISON:  CT 04/23/2014.  Abdomen 08/17/2011  FINDINGS: An enteric tube is been placed. The tip is at the left medial costophrenic angle consistent with location at the EG junction. Proximal side hole is probably in the distal esophagus. Visualized small bowel demonstrate mild gaseous distention. Residual contrast material in the bladder.  IMPRESSION: Negative.   Electronically Signed   By: Lucienne Capers M.D.   On: 04/23/2014 22:36    Scheduled Meds: . antiseptic oral rinse  15 mL Mouth Rinse BID  . enoxaparin (LOVENOX) injection  30 mg Subcutaneous Q24H  . insulin aspart  0-9 Units Subcutaneous 6 times per day   Continuous Infusions: . sodium chloride      Principal Problem:   SBO (small bowel obstruction) Active Problems:  HTN (hypertension), benign   DM (diabetes mellitus), type 2, uncontrolled   Abdominal pain, unspecified site    Time spent: 40 minutes   [facility] Pager [phone]. If 8PM-8AM, please contact night-coverage at [email or web address], password [facility] 04/26/2014, 10:26 AM  LOS: 3 days

## 2014-04-27 ENCOUNTER — Inpatient Hospital Stay (HOSPITAL_COMMUNITY): Payer: PRIVATE HEALTH INSURANCE

## 2014-04-27 LAB — GLUCOSE, CAPILLARY
GLUCOSE-CAPILLARY: 94 mg/dL (ref 70–99)
GLUCOSE-CAPILLARY: 95 mg/dL (ref 70–99)
GLUCOSE-CAPILLARY: 91 mg/dL (ref 70–99)
GLUCOSE-CAPILLARY: 91 mg/dL (ref 70–99)
Glucose-Capillary: 139 mg/dL — ABNORMAL HIGH (ref 70–99)
Glucose-Capillary: 77 mg/dL (ref 70–99)

## 2014-04-27 MED ORDER — DEXTROSE-NACL 5-0.9 % IV SOLN
INTRAVENOUS | Status: AC
  Administered 2014-04-27 (×2): via INTRAVENOUS

## 2014-04-27 NOTE — Progress Notes (Signed)
Surgeon consult, will view ABD x-ray and see about taking tube out and getting diet.

## 2014-04-27 NOTE — Progress Notes (Signed)
Physical Therapy Treatment Patient Details Name: Erika Day MRN: [id number] DOB: [birth year] Today's Date: 04/27/2014    History of Present Illness Erika Day is a 80 y.o. female with a past medical history of CVA, type 2 diabetes, hypertension, dyslipidemia, history of ovarian mass, small bowel obstruction, status post exploratory laparotomy in 2012 performed at [facility], poor functional status, presenting to the emergency department with complaints of abdominal pain, nausea and vomiting.    PT Comments    Pt. Appears with generalized weakness , slow moving and with limited activity tolerance.  Hopefully she will start to regain strength when she is able to begin eating.    Follow Up Recommendations  Home health PT;Other (comment) (home health aide)     Equipment Recommendations  None recommended by PT    Recommendations for Other Services OT consult     Precautions / Restrictions Precautions Precautions: Fall Precaution Comments: NG tube Restrictions Weight Bearing Restrictions: No    Mobility  Bed Mobility Overal bed mobility: Needs Assistance Bed Mobility: Supine to Sit     Supine to sit: Min assist     General bed mobility comments: needs increased time due to moves slowly  Transfers Overall transfer level: Needs assistance Equipment used: Rolling walker (2 wheeled) Transfers: Sit to/from Stand Sit to Stand: Min assist         General transfer comment: needs increased time to rise to stand and to sit back down on the bed.  Needs min assist for transitions  Ambulation/Gait Ambulation/Gait assistance: Min assist Ambulation Distance (Feet): 24 Feet Assistive device: Rolling walker (2 wheeled) Gait Pattern/deviations: Step-through pattern;Trunk flexed;Shuffle;Decreased step length - right;Decreased step length - left Gait velocity: decreased   General Gait Details: Pt. tends to keep RW too far in front of her, needs cueing to step forward into RW, min assist  for safety and stability   Stairs            Wheelchair Mobility    Modified Rankin (Stroke Patients Only)       Balance                                    Cognition Arousal/Alertness: Awake/alert Behavior During Therapy: WFL for tasks assessed/performed Overall Cognitive Status: Within Functional Limits for tasks assessed                      Exercises      General Comments        Pertinent Vitals/Pain See vitals tab No distress noted    Home Living                      Prior Function            PT Goals (current goals can now be found in the care plan section) Progress towards PT goals: Progressing toward goals    Frequency  Min 3X/week    PT Plan Current plan remains appropriate    Co-evaluation             End of Session Equipment Utilized During Treatment: Gait belt Activity Tolerance: Patient limited by fatigue Patient left: in bed;with call bell/phone within reach;with bed alarm set     Time: 1115-1130 PT Time Calculation (min): 15 min  Charges:  $Gait Training: 8-22 mins  G CodesLadona Ridgel 04/27/2014, 11:38 AM [facility] [phone] [place] [phone]

## 2014-04-27 NOTE — Progress Notes (Signed)
Pt surgeon called. Abd x-ray little worse today, will try clear liquids, leave NG tube in and will repeat ABD x-ray in the am.

## 2014-04-27 NOTE — Progress Notes (Signed)
Agree with above, appears to be resolving conservatively

## 2014-04-27 NOTE — Progress Notes (Signed)
Pt gone down via bed for abd x-ray.

## 2014-04-27 NOTE — Progress Notes (Signed)
Patient ID: Erika Day, female   DOB: 21-Mar-1930, 80 y.o.   MRN: [id number]    Subjective: Pt feels well today.  Still passing flatus and had a BM yesterday.  No nausea with NGT clamped.  Objective: Vital signs in last 24 hours: Temp:  [97.6 F (36.4 C)-98.8 F (37.1 C)] 98.6 F (37 C) (06/26 1001) Pulse Rate:  [61-70] 61 (06/26 1001) Resp:  [18] 18 (06/26 1001) BP: (139-151)/(68-79) 139/70 mmHg (06/26 1001) SpO2:  [94 %-96 %] 94 % (06/26 1001) Weight:  [168 lb 3.4 oz (76.3 kg)] 168 lb 3.4 oz (76.3 kg) 04/30/23 2006) Last BM Date: 04/29/2014  Intake/Output from previous day: 04-30-23 0701 - 06/26 0700 In: 1807.5 [P.O.:600; I.V.:1207.5] Out: 80 [Urine:80] Intake/Output this shift:    PE: Abd: soft, but more distended today, NT, about 30cc of NGT residual, +BS  Lab Results:   Recent Labs  04/25/14 0505 04-29-2014 0518  WBC 7.6 7.9  HGB 10.3* 10.0*  HCT 32.2* 30.8*  PLT 157 135*   BMET  Recent Labs  04/25/14 0505 04/25/14 1143  NA 146 145  K 3.8 4.3  CL 106 106  CO2 19 20  GLUCOSE 51* 134*  BUN 18 19  CREATININE 1.20* 1.18*  CALCIUM 9.5 9.5   PT/INR No results found for this basename: LABPROT, INR,  in the last 72 hours CMP     Component Value Date/Time   NA 145 04/25/2014 1143   K 4.3 04/25/2014 1143   CL 106 04/25/2014 1143   CO2 20 04/25/2014 1143   GLUCOSE 134* 04/25/2014 1143   BUN 19 04/25/2014 1143   CREATININE 1.18* 04/25/2014 1143   CALCIUM 9.5 04/25/2014 1143   PROT 5.6* 04/24/2014 0421   ALBUMIN 2.8* 04/24/2014 0421   AST 12 04/24/2014 0421   ALT 5 04/24/2014 0421   ALKPHOS 60 04/24/2014 0421   BILITOT 0.3 04/24/2014 0421   GFRNONAA 42* 04/25/2014 1143   GFRAA 48* 04/25/2014 1143   Lipase     Component Value Date/Time   LIPASE 34 04/23/2014 1052       Studies/Results: Dg Abd 2 Views  2014/04/29   CLINICAL DATA:  Abdominal pain, small bowel obstruction  EXAM: ABDOMEN - 2 VIEW  COMPARISON:  04/25/2014  FINDINGS: Nasogastric tube is appropriately  positioned. Slight decrease in gaseous prominence of multiple central loops of small bowel. Maximal diameter 3.4 cm compared to 4.6 cm previously. Several presumed colonic air-fluid levels are noted at decubitus imaging, with several differential small bowel air-fluid levels. Extensive degenerative change of the lumbar spine reidentified. Ingested material or metallic foreign bodies are reidentified over the pelvis. Pelvic phleboliths versus vascular calcifications are again noted. Cholecystectomy clips are noted.  IMPRESSION: Improving small bowel obstruction with decrease in caliber of small bowel dilatation.   Electronically Signed   By: Conchita Paris M.D.   On: 04-29-2014 08:16    Anti-infectives: Anti-infectives   None       Assessment/Plan  1. PSBO  Plan: 1. Will repeat a film today prior to removal of NGT just to make sure she hasn't had an increase in bowel distention.  If this looks ok, then will dc NGT and give clear liquids.   LOS: 4 days    Erika Day E 04/27/2014, 10:40 AM Pager: [phone]

## 2014-04-27 NOTE — Progress Notes (Signed)
[facility] PROGRESS NOTE  Erika Day CBU:[id number] DOB: 05-15-1930 DOA: 04/23/2014 PCP: Philis Fendt, MD  Assessment/Plan: Principal Problem:   SBO (small bowel obstruction) Active Problems:   HTN (hypertension), benign   DM (diabetes mellitus), type 2, uncontrolled   Abdominal pain, unspecified site    SBO (small bowel obstruction)  -NG tube clamped since yesterday, patient still n.p.o.., Continue IV fluids, surgery to make further recommendations Repeat KUB today - General surgery following, will defer to surgery for further management , Patient passing flatus declining NG tube output.  Encouraged to ambulate   HTN (hypertension), soft but stable  Continue D5 normal saline IV fluids, holding verapamil and lisinopril   DM (diabetes mellitus), type 2, hypoglycemic yesterday started on D5 normal saline  Can discontinue D5 today and monitor CBG if Accu-Cheks remained above 70 - Continue sliding scale insulin , continue Accu-Cheks    Anxiety:  - Placed on Ativan as needed   DVT Prophylaxis: Lovenox  Code Status: Full code  Family Communication: Discussed with patient and daughter at the bedside  Disposition: Depends on surgical recommendations  Consultants:  General surgery Procedures:  NGT suction Antibiotics:  None  HPI/Subjective:  Pt feels better. Some flatus and 2 small BMs yesterday after a suppository     Objective: Filed Vitals:   04/26/14 1737 04/26/14 2006 04/27/14 0500 04/27/14 1001  BP: 150/74 149/79 151/68 139/70  Pulse: 68 70 62 61  Temp: 98.8 F (37.1 C) 97.6 F (36.4 C) 98.3 F (36.8 C) 98.6 F (37 C)  TempSrc: Oral Oral Oral Oral  Resp: 18 18 18 18   Weight:  76.3 kg (168 lb 3.4 oz)    SpO2: 96% 96% 94% 94%    Intake/Output Summary (Last 24 hours) at 04/27/14 1028 Last data filed at 04/27/14 0854  Gross per 24 hour  Intake 1807.5 ml  Output     80 ml  Net 1727.5 ml    Exam:  General: Alert and awake, oriented x3,  not in any acute distress.  CVS: S1-S2 clear, no murmur rubs or gallops  Chest: clear to auscultation bilaterally, no wheezing, rales or rhonchi  Abdomen: soft nontender, distended, ventral hernia, hypoactive bowel sounds  Extremities: no cyanosis, clubbing or edema noted bilaterally  Neuro: Cranial nerves II-XII intact, no focal neurological deficits .    Data Reviewed: Basic Metabolic Panel:  Recent Labs Lab 04/23/14 1052 04/24/14 0421 04/25/14 0505 04/25/14 1143  NA 139 141 146 145  K 5.0 4.4 3.8 4.3  CL 101 107 106 106  CO2 26 21 19 20   GLUCOSE 152* 74 51* 134*  BUN 11 12 18 19   CREATININE 1.04 1.15* 1.20* 1.18*  CALCIUM 10.5 9.3 9.5 9.5    Liver Function Tests:  Recent Labs Lab 04/23/14 1052 04/24/14 0421  AST 13 12  ALT 6 5  ALKPHOS 77 60  BILITOT 0.3 0.3  PROT 7.2 5.6*  ALBUMIN 3.7 2.8*    Recent Labs Lab 04/23/14 1052  LIPASE 34   No results found for this basename: AMMONIA,  in the last 168 hours  CBC:  Recent Labs Lab 04/23/14 1052 04/24/14 0421 04/25/14 0505 04/26/14 0518  WBC 7.7 10.2 7.6 7.9  NEUTROABS 3.7  --  2.6  --   HGB 12.4 10.5* 10.3* 10.0*  HCT 37.5 32.1* 32.2* 30.8*  MCV 85.0 85.4 85.4 87.3  PLT 160 147* 157 135*    Cardiac Enzymes: No results found for this basename: CKTOTAL, CKMB, CKMBINDEX, TROPONINI,  in the last 168 hours BNP (last 3 results) No results found for this basename: PROBNP,  in the last 8760 hours   CBG:  Recent Labs Lab 04/26/14 2002 04/26/14 2357 04/27/14 0323 04/27/14 0606 04/27/14 0754  GLUCAP 81 77 94 95 91    No results found for this or any previous visit (from the past 240 hour(s)).   Studies: Ct Abdomen Pelvis W Contrast  04/23/2014   CLINICAL DATA:  Epigastric pain and vomiting. History of cholecystectomy.  EXAM: CT ABDOMEN AND PELVIS WITH CONTRAST  TECHNIQUE: Multidetector CT imaging of the abdomen and pelvis was performed using the standard protocol following bolus administration  of intravenous contrast.  CONTRAST:  112mL OMNIPAQUE IOHEXOL 300 MG/ML  SOLN  COMPARISON:  08/17/2011  FINDINGS: Subsegmental atelectasis is present in the lung bases. 3 vessel coronary artery calcifications are noted.  The gallbladder is surgically absent. The liver, spleen, adrenal glands, kidneys, and pancreas have an unremarkable enhanced appearance. Mild dilatation of the common bile duct is unchanged and likely related to prior cholecystectomy.  There is new, moderate volume ascites. There is moderate dilatation of multiple fluid-filled small bowel loops measuring up to approximately 4 cm in diameter. A small amount of pneumatosis is questioned in a small bowel loop in the right mid abdomen (series 2, images 37 and 39). The distal ileum and colon are largely decompressed. There is a small bowel containing ventral hernia, although contrast does pass beyond this, and the level of apparent small bowel obstruction is quite distal to this.  Sequelae of interval hysterectomy are identified. There is a partially calcified 4.8 x 3.9 cm soft tissue mass in the right hemipelvis. Cystic adnexal mass has been resected. There is soft tissue fullness in the peritoneum, stable to slightly more prominent than on the prior study. Right inguinal lymph nodes measure up to 1.3 cm in short axis, mildly increased in size compared to the prior study. Bladder is partially decompressed with possible mild bladder wall thickening. Advanced atherosclerotic calcification is noted of the abdominal aorta and common iliac arteries. Sclerosis about the right greater than left sacroiliac joints is unchanged. Advanced disc degeneration is seen throughout the lower thoracic and lumbar spine.  IMPRESSION: 1. High-grade small bowel obstruction with transition to decompressed distal small bowel in the right mid abdomen. Early pneumatosis is questioned in a small bowel loop in the right mid abdomen. 2. Interval hysterectomy with resection of the  cystic mass described on the prior CT. Partially calcified soft tissue nodule is present in the posterior right pelvis, and this is concerning for a metastatic deposit. 3. Mild interval enlargement of right inguinal lymph nodes, nonspecific however nodal metastatic disease is possible. 4. Soft tissue fullness in the perineum, stable to slightly more prominent than on the prior study. This may reflect pelvic floor laxity, although clinical correlation is recommended to excluded metastatic disease in the low pelvis.   Electronically Signed   By: Logan Bores   On: 04/23/2014 12:50   Dg Abd 2 Views  04/26/2014   CLINICAL DATA:  Abdominal pain, small bowel obstruction  EXAM: ABDOMEN - 2 VIEW  COMPARISON:  04/25/2014  FINDINGS: Nasogastric tube is appropriately positioned. Slight decrease in gaseous prominence of multiple central loops of small bowel. Maximal diameter 3.4 cm compared to 4.6 cm previously. Several presumed colonic air-fluid levels are noted at decubitus imaging, with several differential small bowel air-fluid levels. Extensive degenerative change of the lumbar spine reidentified. Ingested material or metallic foreign bodies  are reidentified over the pelvis. Pelvic phleboliths versus vascular calcifications are again noted. Cholecystectomy clips are noted.  IMPRESSION: Improving small bowel obstruction with decrease in caliber of small bowel dilatation.   Electronically Signed   By: Conchita Paris M.D.   On: 04/26/2014 08:16   Dg Abd 2 Views  04/25/2014   CLINICAL DATA:  Small bowel obstruction.  EXAM: ABDOMEN - 2 VIEW  COMPARISON:  04/24/2014  FINDINGS: NG tube tip is in the distal stomach. Small bowel dilatation in the mid abdomen, minimally improved since prior study. Small bowel diameter approximately 4-5 cm. No free air or organomegaly. Prior cholecystectomy.  Low lung volumes with bibasilar atelectasis.  IMPRESSION: Slight interval decreased distention of mid small bowel loops. Continued small  bowel obstruction pattern.   Electronically Signed   By: Rolm Baptise M.D.   On: 04/25/2014 08:24   Dg Abd 2 Views  04/24/2014   CLINICAL DATA:  Abdominal pain.  Followup small bowel obstruction.  EXAM: ABDOMEN - 2 VIEW  COMPARISON:  04/23/2014  FINDINGS: Nasogastric tube is in adequate position with side port and tip over the stomach in the left upper quadrant. Examination demonstrates air and stool throughout the colon. There is persistence of several air-filled dilated small bowel loops in a few scattered air-fluid levels without significant overall change. There is no evidence of free peritoneal air. Contrast present over the bladder. Remainder of the exam is unchanged.  IMPRESSION: Persistent air-filled dilated small bowel loops in the left mid abdomen without significant overall change compatible with known small bowel obstruction.  Nasogastric tube in adequate position.   Electronically Signed   By: Marin Olp M.D.   On: 04/24/2014 08:17   Dg Abd Portable 1v  04/23/2014   CLINICAL DATA:  Nasogastric tube placement, recently advanced.  EXAM: PORTABLE ABDOMEN - 1 VIEW  COMPARISON:  None.  FINDINGS: The nasogastric tube is blurred by motion artifact. Its tip is especially blurred. The tube is noted to extend down into the stomach body and possibly to the antrum, but is not visible distal to this. There appears to be an adequate amount of the tube within the stomach.  Thoracolumbar spondylosis and degenerative disc disease.  Abnormal dilated small bowel, up to 4.4 cm, compatible with small bowel obstruction.  IMPRESSION: 1. Nasogastric tube tip is probably in the stomach body - the tip itself is obscured by motion artifact causing blurring. 2. Dilated bowel compatible with small bowel obstruction.   Electronically Signed   By: Sherryl Barters M.D.   On: 04/23/2014 23:55   Dg Abd Portable 1v  04/23/2014   CLINICAL DATA:  Tube placement.  EXAM: PORTABLE ABDOMEN - 1 VIEW  COMPARISON:  CT 04/23/2014.   Abdomen 08/17/2011  FINDINGS: An enteric tube is been placed. The tip is at the left medial costophrenic angle consistent with location at the EG junction. Proximal side hole is probably in the distal esophagus. Visualized small bowel demonstrate mild gaseous distention. Residual contrast material in the bladder.  IMPRESSION: Negative.   Electronically Signed   By: Lucienne Capers M.D.   On: 04/23/2014 22:36    Scheduled Meds: . antiseptic oral rinse  15 mL Mouth Rinse BID  . enoxaparin (LOVENOX) injection  30 mg Subcutaneous Q24H  . insulin aspart  0-9 Units Subcutaneous 6 times per day   Continuous Infusions: . dextrose 5 % and 0.9% NaCl 75 mL/hr at 04/27/14 1011    Principal Problem:   SBO (small bowel obstruction) Active  Problems:   HTN (hypertension), benign   DM (diabetes mellitus), type 2, uncontrolled   Abdominal pain, unspecified site    Time spent: 40 minutes   [facility] Pager [phone]. If 8PM-8AM, please contact night-coverage at [email or web address], password [facility] 04/27/2014, 10:28 AM  LOS: 4 days

## 2014-04-28 ENCOUNTER — Inpatient Hospital Stay (HOSPITAL_COMMUNITY): Payer: PRIVATE HEALTH INSURANCE

## 2014-04-28 DIAGNOSIS — K56609 Unspecified intestinal obstruction, unspecified as to partial versus complete obstruction: Secondary | ICD-10-CM | POA: Diagnosis not present

## 2014-04-28 DIAGNOSIS — E46 Unspecified protein-calorie malnutrition: Secondary | ICD-10-CM

## 2014-04-28 DIAGNOSIS — K565 Intestinal adhesions [bands], unspecified as to partial versus complete obstruction: Secondary | ICD-10-CM | POA: Diagnosis not present

## 2014-04-28 LAB — GLUCOSE, CAPILLARY
GLUCOSE-CAPILLARY: 71 mg/dL (ref 70–99)
GLUCOSE-CAPILLARY: 86 mg/dL (ref 70–99)
GLUCOSE-CAPILLARY: 87 mg/dL (ref 70–99)
GLUCOSE-CAPILLARY: 129 mg/dL — AB (ref 70–99)
Glucose-Capillary: 112 mg/dL — ABNORMAL HIGH (ref 70–99)
Glucose-Capillary: 115 mg/dL — ABNORMAL HIGH (ref 70–99)
Glucose-Capillary: 127 mg/dL — ABNORMAL HIGH (ref 70–99)

## 2014-04-28 MED ORDER — SODIUM CHLORIDE 0.9 % IJ SOLN
10.0000 mL | Freq: Two times a day (BID) | INTRAMUSCULAR | Status: DC
  Administered 2014-04-28 – 2014-05-06 (×6): 10 mL

## 2014-04-28 MED ORDER — FAT EMULSION 20 % IV EMUL
250.0000 mL | INTRAVENOUS | Status: AC
  Administered 2014-04-28: 250 mL via INTRAVENOUS
  Filled 2014-04-28: qty 250

## 2014-04-28 MED ORDER — DEXTROSE-NACL 5-0.9 % IV SOLN
INTRAVENOUS | Status: AC
  Administered 2014-04-28: 35 mL/h via INTRAVENOUS

## 2014-04-28 MED ORDER — TRACE MINERALS CR-CU-F-FE-I-MN-MO-SE-ZN IV SOLN
INTRAVENOUS | Status: AC
  Administered 2014-04-28: 18:00:00 via INTRAVENOUS
  Filled 2014-04-28: qty 1000

## 2014-04-28 MED ORDER — SODIUM CHLORIDE 0.9 % IJ SOLN
10.0000 mL | INTRAMUSCULAR | Status: DC | PRN
  Administered 2014-04-29: 20 mL
  Administered 2014-05-02 – 2014-05-07 (×4): 10 mL

## 2014-04-28 MED ORDER — DIAZEPAM 2 MG PO TABS
1.0000 mg | ORAL_TABLET | Freq: Every day | ORAL | Status: DC | PRN
  Administered 2014-04-28: 1 mg via ORAL
  Filled 2014-04-28: qty 1

## 2014-04-28 NOTE — Progress Notes (Signed)
PARENTERAL NUTRITION CONSULT NOTE - INITIAL  Pharmacy Consult for TPN Indication: unresolving pSBO  Allergies  Allergen Reactions  . Morphine And Related Other (See Comments)    Blood sugar dropped     Patient Measurements: Weight: 168 lb 3.4 oz (76.3 kg) Height: 4'11'' IBW: 43.2 kg Adjusted Body Weight:  Usual Weight: 76.3  Vital Signs: Temp: 98.2 F (36.8 C) (06/27 0933) Temp src: Oral (06/27 0933) BP: 150/55 mmHg (06/27 0933) Pulse Rate: 58 (06/27 0933) Intake/Output from previous day: 06/26 0701 - 06/27 0700 In: 360 [P.O.:360] Out: -  Intake/Output from this shift: Total I/O In: 120 [P.O.:120] Out: -   Labs:  Recent Labs  04/26/14 0518  WBC 7.9  HGB 10.0*  HCT 30.8*  PLT 135*     Recent Labs  04/25/14 1143  NA 145  K 4.3  CL 106  CO2 20  GLUCOSE 134*  BUN 19  CREATININE 1.18*  CALCIUM 9.5   The CrCl is unknown because both a height and weight (above a minimum accepted value) are required for this calculation.    Recent Labs  04/28/14 0015 04/28/14 0404 04/28/14 0807  GLUCAP 112* 127* 71    Medical History: Past Medical History  Diagnosis Date  . Stroke   . Acid reflux disease   . Panic attacks   . Hypertension   . DDD (degenerative disc disease), lumbar   . Dyslipidemia   . Degenerative joint disease     Insulin Requirements in the past 24 hours:  2 units SSI (CBG 71-139)  Current Nutrition:  NPO   Admit: Abdominal pain. N/V/D Assessment: pSBO on CT  GI: h/o SBO. Has been passing flatus and having BM during this admission. 6/26 abd Xray worsened; KUB with persistent SBO. Has not tolerated having NG tubed clamped. Start TPN 6/27.  Endo: DM. A1C 5.4. CBG 71-139 on SSI. Lytes: Last lytes done 6/24 ok.  Renal: Scr 1.18 stable. Pulm: 98% Cards: HTN, HLD. Hepatobil: LFTs WNL Neuro: h/o CVA, anxiety ID:   Afebrile. WBC 7.9. No abx. Heme/Onc: Hgb 10. Best Practices: MC, LMWH TPN Access: PICC ordered 6/27 TPN  day#:0   Nutritional Goals:  Kcal: 1350 - 1500  Protein: 70 - 80 g Goal: Clinimix 5/15 at 69ml/hr with lipids at 29ml/hr = 1358 kcal and 72g protein  Plan:  F/u PICC line insertion Start Clinimix 5/15 at 13ml/hr with lipids at 76ml/hr When TPN starts, decrease IVF to 9ml/hr Nutrition labs on Mon/Thurs and prn   Crystal S. Alford Highland, PharmD, BCPS Clinical Staff Pharmacist Pager [phone]  Erika Day 04/28/2014,11:09 AM

## 2014-04-28 NOTE — Progress Notes (Signed)
Peripherally Inserted Central Catheter/Midline Placement  The IV Nurse has discussed with the patient and/or persons authorized to consent for the patient, the purpose of this procedure and the potential benefits and risks involved with this procedure.  The benefits include less needle sticks, lab draws from the catheter and patient may be discharged home with the catheter.  Risks include, but not limited to, infection, bleeding, blood clot (thrombus formation), and puncture of an artery; nerve damage and irregular heat beat.  Alternatives to this procedure were also discussed.  PICC/Midline Placement Documentation  PICC / Midline Double Lumen 04/28/14 PICC Right Brachial 42 cm 0 cm (Active)  Indication for Insertion or Continuance of Line Administration of hyperosmolar/irritating solutions (i.e. TPN, Vancomycin, etc.) 04/28/2014  2:11 PM  Exposed Catheter (cm) 0 cm 04/28/2014  2:11 PM  Lumen #1 Status Flushed;Saline locked;Blood return noted 04/28/2014  2:11 PM  Lumen #2 Status Flushed;Saline locked;Blood return noted 04/28/2014  2:11 PM  Dressing Change Due 05/05/14 04/28/2014  2:11 PM       Gordan Payment 04/28/2014, 2:25 PM

## 2014-04-28 NOTE — Progress Notes (Signed)
Patient ID: Erika Day, female   DOB: Mar 18, 1930, 80 y.o.   MRN: [id number]    Subjective: Pt developed abd pain, distention and nausea after clears with NG clamped.  NG placed back to suction last night.  ~32ml bilious fluid in canister.  Pt feels better now.  Objective: Vital signs in last 24 hours: Temp:  [98.2 F (36.8 C)-99.2 F (37.3 C)] 98.2 F (36.8 C) May 19, 2023 0933) Pulse Rate:  [58-91] 58 2023-05-19 0933) Resp:  [16-20] 16 05/19/2023 0933) BP: (123-150)/(55-72) 150/55 mmHg 05/19/2023 0933) SpO2:  [97 %-100 %] 98 % 05-19-2023 0933) Weight:  [168 lb 3.4 oz (76.3 kg)] 168 lb 3.4 oz (76.3 kg) (06/26 2025) Last BM Date: 04/26/14  Intake/Output from previous day: 06/26 0701 - May 19, 2023 0700 In: 360 [P.O.:360] Out: -  Intake/Output this shift: Total I/O In: 120 [P.O.:120] Out: -   PE: Abd: soft, but more distended today, NT, about 30cc of NGT residual, +BS  Lab Results:   Recent Labs  04/26/14 0518  WBC 7.9  HGB 10.0*  HCT 30.8*  PLT 135*   BMET  Recent Labs  04/25/14 1143  NA 145  K 4.3  CL 106  CO2 20  GLUCOSE 134*  BUN 19  CREATININE 1.18*  CALCIUM 9.5   PT/INR No results found for this basename: LABPROT, INR,  in the last 72 hours CMP     Component Value Date/Time   NA 145 04/25/2014 1143   K 4.3 04/25/2014 1143   CL 106 04/25/2014 1143   CO2 20 04/25/2014 1143   GLUCOSE 134* 04/25/2014 1143   BUN 19 04/25/2014 1143   CREATININE 1.18* 04/25/2014 1143   CALCIUM 9.5 04/25/2014 1143   PROT 5.6* 04/24/2014 0421   ALBUMIN 2.8* 04/24/2014 0421   AST 12 04/24/2014 0421   ALT 5 04/24/2014 0421   ALKPHOS 60 04/24/2014 0421   BILITOT 0.3 04/24/2014 0421   GFRNONAA 42* 04/25/2014 1143   GFRAA 48* 04/25/2014 1143   Lipase     Component Value Date/Time   LIPASE 34 04/23/2014 1052       Studies/Results: Dg Abd 2 Views  05/18/2014   CLINICAL DATA:  Small bowel obstruction.  EXAM: ABDOMEN - 2 VIEW  COMPARISON:  04/27/2014.  FINDINGS: Multiple dilated loops of gas-filled small  bowel are noted in the central abdomen measuring up to 5.5 cm in diameter. Multiple air-fluid levels are noted on the decubitus view. No pneumoperitoneum. Paucity of colonic gas. Tip of nasogastric tube is in the distal stomach. Surgical clips in the right upper quadrant of the abdomen, compatible with prior cholecystectomy.  IMPRESSION: 1. Bowel gas pattern remains compatible with small bowel obstruction, as above. 2. No pneumoperitoneum. 3. Tip of nasogastric tube is in the distal stomach.   Electronically Signed   By: Vinnie Langton M.D.   On: 18-May-2014 09:52   Dg Abd 2 Views  04/27/2014   CLINICAL DATA:  Follow-up small bowel obstruction  EXAM: ABDOMEN - 2 VIEW  COMPARISON:  04/26/2014  FINDINGS: Numerous dilated small bowel loops with multiple small bowel air-fluid levels without significant interval change compared with 04/26/2014. Nasogastric 2 present within the stomach. There is no evidence of pneumoperitoneum, portal venous gas or pneumatosis. There are no pathologic calcifications along the expected course of the ureters.  Degenerative changes of the lower lumbar spine.  IMPRESSION: Persistent small bowel obstruction without significant interval compared with 04/26/2014.   Electronically Signed   By: Kathreen Devoid   On:  04/27/2014 13:48    Anti-infectives: Anti-infectives   None       Assessment/Plan  1. PSBO  Plan: 1. Patient not resolving SBO medical management.  Has been without substantial nutrition for ~1 week.  Will have picc placed and start TPN.  We discussed possible surgery.  No indications for emergent surgery.  Will discuss with husband again tomorrow.  Anticipate need for surgery early next week if she does not improve.    LOS: 5 days    Chrishawn Kring C. 03/11/2584, 27:78 AM

## 2014-04-28 NOTE — Progress Notes (Addendum)
NUTRITION FOLLOW UP/TPN CONSULT  Intervention:   -TPN per pharmacy-recommend conservative advancement as pt with sub-optimal nutrition for 7 days -Recommend updated CMP -Diet advancement per MD -Will continue to monitor and supplement as warranted/tolerated  Nutrition Dx:   Inadequate oral intake related to altered GI function as evidenced by need for bowel rest and slow diet advancement-ongoing   Goal:   Intake to meet >90% of estimated nutrition needs- not met   Monitor:   TPN tolerance, GI profile, diet order, total protein/energy intake, I/O's, labs, weights  Assessment:   6/25: Surgery consulted and recommending NGT for decompression with bowel rest. NGT clamped this morning, team hoping to avoid surgical intervention. Pt reports that her belly feels "okay" and she feels weak from not having eaten anything.  Patient with approximately 9% wt loss x 1 month, per patient report. She hasn't had anything to eat since Sunday (5 days ago.) She states that when she does eat, she eats minimally. For example, she might just have two bowls of grits in the morning with cheese and that would be all she would eat in one day. She states that she drinks water and gingerale all day long, and that she also really enjoys fruit juices. Reports that her highest weight in her lifetime was around 285 lb and weighed that just a couple of years ago.   6/26: -Pt's diet advanced to clear liquid on 6/26; however this resulted in abd distension and bloating. Pt downgraded to NPO. NGT was started on suction with 300 ml output, resulting in some relief. Pt with bowel sounds. No BM in 3 days. -Received consult for TPN initiation. PICC lined to be placed 6/27 -Possible for surgery early next week if no improvement per surgery note -Per pharmacy note, plan to initiate Clinimix 5/15 at 40 ml/hr with lipids at 7 ml/hr, and to advance to goal rate of 60 ml/hr -Clinimix 5/15 at 60 ml/hr will provide 1358 kcal, and 72  gram protein (100% est kcal and protein needs) -Discussed pt with Pharm, plan for updated CMP labs tomorrow and Monday/Thursday  Height: Ht Readings from Last 1 Encounters:  09/06/11 4' 11.8" (1.519 m)    Weight Status:   Wt Readings from Last 1 Encounters:  04/27/14 168 lb 3.4 oz (76.3 kg)    Estimated needs:  Kcal: 1350 - 1500  Protein: 70 - 80 g  Fluid: at least 1.5 liters   Skin: WDL  Diet Order: NPO   Intake/Output Summary (Last 24 hours) at 04/28/14 1428 Last data filed at 04/28/14 1300  Gross per 24 hour  Intake    480 ml  Output      0 ml  Net    480 ml    Last BM: 6/25   Labs:   Recent Labs Lab 04/24/14 0421 04/25/14 0505 04/25/14 1143  NA 141 146 145  K 4.4 3.8 4.3  CL 107 106 106  CO2 21 19 20   BUN 12 18 19   CREATININE 1.15* 1.20* 1.18*  CALCIUM 9.3 9.5 9.5  GLUCOSE 74 51* 134*    CBG (last 3)   Recent Labs  04/28/14 0404 04/28/14 0807 04/28/14 1140  GLUCAP 127* 71 86    Scheduled Meds: . antiseptic oral rinse  15 mL Mouth Rinse BID  . enoxaparin (LOVENOX) injection  30 mg Subcutaneous Q24H  . insulin aspart  0-9 Units Subcutaneous 6 times per day  . sodium chloride  10-40 mL Intracatheter Q12H    Continuous Infusions: .  dextrose 5 % and 0.9% NaCl 75 mL/hr at 04/27/14 2118  . dextrose 5 % and 0.9% NaCl    . Marland KitchenTPN (CLINIMIX-E) Adult     And  . fat emulsion      Atlee Abide MS RD LDN Clinical Dietitian [phone]

## 2014-04-28 NOTE — Progress Notes (Signed)
[facility] PROGRESS NOTE  Erika Day ZDG:[id number] DOB: 1929-11-22 DOA: 04/23/2014 PCP: Philis Fendt, MD  Assessment/Plan: Principal Problem:   SBO (small bowel obstruction) Active Problems:   HTN (hypertension), benign   DM (diabetes mellitus), type 2, uncontrolled   Abdominal pain, unspecified site    SBO (small bowel obstruction)  Appears to be a little bit worse today -NG tube clamped since yesterday, after starting clear the patient has developed some bloating and belching Repeat KUB today shows persistent small bowel pattern Not passing any flatus  Patient will be made n.p.o. and we will restart NG tube suction until seen by surgery Encouraged to ambulate    HTN (hypertension), soft but stable  Continue D5 normal saline IV fluids, holding verapamil and lisinopril    DM (diabetes mellitus), type 2, hypoglycemic yesterday started on D5 normal saline  Can discontinue D5 today and monitor CBG if Accu-Cheks remained above 70 Accu-Cheks below 70 we'll restart D5NS  - Continue sliding scale insulin , continue Accu-Cheks    Anxiety:  - Placed on Ativan as needed  DVT Prophylaxis: Lovenox  Code Status: Full code  Family Communication: Discussed with patient and daughter at the bedside  Disposition: Depends on surgical recommendations  Consultants:  General surgery Procedures:  NGT suction Antibiotics:  None    HPI/Subjective: Complaining of bloating and belching today, has not passed any flatness since yesterday, no BM since yesterday  Objective: Filed Vitals:   04/27/14 1900 04/27/14 2025 04/28/14 0500 04/28/14 0933  BP: 123/66 148/67 150/72 150/55  Pulse: 91 62 62 58  Temp: 99.2 F (37.3 C) 98.7 F (37.1 C) 98.2 F (36.8 C) 98.2 F (36.8 C)  TempSrc: Oral Oral Oral Oral  Resp: 20 18 18 16   Weight:  76.3 kg (168 lb 3.4 oz)    SpO2: 98% 100% 97% 98%    Intake/Output Summary (Last 24 hours) at 04/28/14 1027 Last data filed at 04/28/14  0800  Gross per 24 hour  Intake    480 ml  Output      0 ml  Net    480 ml    Exam:  General: Alert and awake, oriented x3, not in any acute distress.  CVS: S1-S2 clear, no murmur rubs or gallops  Chest: clear to auscultation bilaterally, no wheezing, rales or rhonchi  Abdomen: soft nontender, distended, ventral hernia, hypoactive bowel sounds  Extremities: no cyanosis, clubbing or edema noted bilaterally  Neuro: Cranial nerves II-XII intact, no focal neurological deficits    Data Reviewed: Basic Metabolic Panel:  Recent Labs Lab 04/23/14 1052 04/24/14 0421 04/25/14 0505 04/25/14 1143  NA 139 141 146 145  K 5.0 4.4 3.8 4.3  CL 101 107 106 106  CO2 26 21 19 20   GLUCOSE 152* 74 51* 134*  BUN 11 12 18 19   CREATININE 1.04 1.15* 1.20* 1.18*  CALCIUM 10.5 9.3 9.5 9.5    Liver Function Tests:  Recent Labs Lab 04/23/14 1052 04/24/14 0421  AST 13 12  ALT 6 5  ALKPHOS 77 60  BILITOT 0.3 0.3  PROT 7.2 5.6*  ALBUMIN 3.7 2.8*    Recent Labs Lab 04/23/14 1052  LIPASE 34   No results found for this basename: AMMONIA,  in the last 168 hours  CBC:  Recent Labs Lab 04/23/14 1052 04/24/14 0421 04/25/14 0505 04/26/14 0518  WBC 7.7 10.2 7.6 7.9  NEUTROABS 3.7  --  2.6  --   HGB 12.4 10.5* 10.3* 10.0*  HCT 37.5 32.1*  32.2* 30.8*  MCV 85.0 85.4 85.4 87.3  PLT 160 147* 157 135*    Cardiac Enzymes: No results found for this basename: CKTOTAL, CKMB, CKMBINDEX, TROPONINI,  in the last 168 hours BNP (last 3 results) No results found for this basename: PROBNP,  in the last 8760 hours   CBG:  Recent Labs Lab 04/27/14 1200 04/27/14 2023 04/28/14 0015 04/28/14 0404 04/28/14 0807  GLUCAP 91 139* 112* 127* 71    No results found for this or any previous visit (from the past 240 hour(s)).   Studies: Ct Abdomen Pelvis W Contrast  04/23/2014   CLINICAL DATA:  Epigastric pain and vomiting. History of cholecystectomy.  EXAM: CT ABDOMEN AND PELVIS WITH CONTRAST   TECHNIQUE: Multidetector CT imaging of the abdomen and pelvis was performed using the standard protocol following bolus administration of intravenous contrast.  CONTRAST:  115mL OMNIPAQUE IOHEXOL 300 MG/ML  SOLN  COMPARISON:  08/17/2011  FINDINGS: Subsegmental atelectasis is present in the lung bases. 3 vessel coronary artery calcifications are noted.  The gallbladder is surgically absent. The liver, spleen, adrenal glands, kidneys, and pancreas have an unremarkable enhanced appearance. Mild dilatation of the common bile duct is unchanged and likely related to prior cholecystectomy.  There is new, moderate volume ascites. There is moderate dilatation of multiple fluid-filled small bowel loops measuring up to approximately 4 cm in diameter. A small amount of pneumatosis is questioned in a small bowel loop in the right mid abdomen (series 2, images 37 and 39). The distal ileum and colon are largely decompressed. There is a small bowel containing ventral hernia, although contrast does pass beyond this, and the level of apparent small bowel obstruction is quite distal to this.  Sequelae of interval hysterectomy are identified. There is a partially calcified 4.8 x 3.9 cm soft tissue mass in the right hemipelvis. Cystic adnexal mass has been resected. There is soft tissue fullness in the peritoneum, stable to slightly more prominent than on the prior study. Right inguinal lymph nodes measure up to 1.3 cm in short axis, mildly increased in size compared to the prior study. Bladder is partially decompressed with possible mild bladder wall thickening. Advanced atherosclerotic calcification is noted of the abdominal aorta and common iliac arteries. Sclerosis about the right greater than left sacroiliac joints is unchanged. Advanced disc degeneration is seen throughout the lower thoracic and lumbar spine.  IMPRESSION: 1. High-grade small bowel obstruction with transition to decompressed distal small bowel in the right mid  abdomen. Early pneumatosis is questioned in a small bowel loop in the right mid abdomen. 2. Interval hysterectomy with resection of the cystic mass described on the prior CT. Partially calcified soft tissue nodule is present in the posterior right pelvis, and this is concerning for a metastatic deposit. 3. Mild interval enlargement of right inguinal lymph nodes, nonspecific however nodal metastatic disease is possible. 4. Soft tissue fullness in the perineum, stable to slightly more prominent than on the prior study. This may reflect pelvic floor laxity, although clinical correlation is recommended to excluded metastatic disease in the low pelvis.   Electronically Signed   By: Logan Bores   On: 04/23/2014 12:50   Dg Abd 2 Views  04/28/2014   CLINICAL DATA:  Small bowel obstruction.  EXAM: ABDOMEN - 2 VIEW  COMPARISON:  04/27/2014.  FINDINGS: Multiple dilated loops of gas-filled small bowel are noted in the central abdomen measuring up to 5.5 cm in diameter. Multiple air-fluid levels are noted on the decubitus view.  No pneumoperitoneum. Paucity of colonic gas. Tip of nasogastric tube is in the distal stomach. Surgical clips in the right upper quadrant of the abdomen, compatible with prior cholecystectomy.  IMPRESSION: 1. Bowel gas pattern remains compatible with small bowel obstruction, as above. 2. No pneumoperitoneum. 3. Tip of nasogastric tube is in the distal stomach.   Electronically Signed   By: Vinnie Langton M.D.   On: 04/28/2014 09:52   Dg Abd 2 Views  04/27/2014   CLINICAL DATA:  Follow-up small bowel obstruction  EXAM: ABDOMEN - 2 VIEW  COMPARISON:  04/26/2014  FINDINGS: Numerous dilated small bowel loops with multiple small bowel air-fluid levels without significant interval change compared with 04/26/2014. Nasogastric 2 present within the stomach. There is no evidence of pneumoperitoneum, portal venous gas or pneumatosis. There are no pathologic calcifications along the expected course of the  ureters.  Degenerative changes of the lower lumbar spine.  IMPRESSION: Persistent small bowel obstruction without significant interval compared with 04/26/2014.   Electronically Signed   By: Kathreen Devoid   On: 04/27/2014 13:48   Dg Abd 2 Views  04/26/2014   CLINICAL DATA:  Abdominal pain, small bowel obstruction  EXAM: ABDOMEN - 2 VIEW  COMPARISON:  04/25/2014  FINDINGS: Nasogastric tube is appropriately positioned. Slight decrease in gaseous prominence of multiple central loops of small bowel. Maximal diameter 3.4 cm compared to 4.6 cm previously. Several presumed colonic air-fluid levels are noted at decubitus imaging, with several differential small bowel air-fluid levels. Extensive degenerative change of the lumbar spine reidentified. Ingested material or metallic foreign bodies are reidentified over the pelvis. Pelvic phleboliths versus vascular calcifications are again noted. Cholecystectomy clips are noted.  IMPRESSION: Improving small bowel obstruction with decrease in caliber of small bowel dilatation.   Electronically Signed   By: Conchita Paris M.D.   On: 04/26/2014 08:16   Dg Abd 2 Views  04/25/2014   CLINICAL DATA:  Small bowel obstruction.  EXAM: ABDOMEN - 2 VIEW  COMPARISON:  04/24/2014  FINDINGS: NG tube tip is in the distal stomach. Small bowel dilatation in the mid abdomen, minimally improved since prior study. Small bowel diameter approximately 4-5 cm. No free air or organomegaly. Prior cholecystectomy.  Low lung volumes with bibasilar atelectasis.  IMPRESSION: Slight interval decreased distention of mid small bowel loops. Continued small bowel obstruction pattern.   Electronically Signed   By: Rolm Baptise M.D.   On: 04/25/2014 08:24   Dg Abd 2 Views  04/24/2014   CLINICAL DATA:  Abdominal pain.  Followup small bowel obstruction.  EXAM: ABDOMEN - 2 VIEW  COMPARISON:  04/23/2014  FINDINGS: Nasogastric tube is in adequate position with side port and tip over the stomach in the left upper  quadrant. Examination demonstrates air and stool throughout the colon. There is persistence of several air-filled dilated small bowel loops in a few scattered air-fluid levels without significant overall change. There is no evidence of free peritoneal air. Contrast present over the bladder. Remainder of the exam is unchanged.  IMPRESSION: Persistent air-filled dilated small bowel loops in the left mid abdomen without significant overall change compatible with known small bowel obstruction.  Nasogastric tube in adequate position.   Electronically Signed   By: Marin Olp M.D.   On: 04/24/2014 08:17   Dg Abd Portable 1v  04/23/2014   CLINICAL DATA:  Nasogastric tube placement, recently advanced.  EXAM: PORTABLE ABDOMEN - 1 VIEW  COMPARISON:  None.  FINDINGS: The nasogastric tube is blurred by motion  artifact. Its tip is especially blurred. The tube is noted to extend down into the stomach body and possibly to the antrum, but is not visible distal to this. There appears to be an adequate amount of the tube within the stomach.  Thoracolumbar spondylosis and degenerative disc disease.  Abnormal dilated small bowel, up to 4.4 cm, compatible with small bowel obstruction.  IMPRESSION: 1. Nasogastric tube tip is probably in the stomach body - the tip itself is obscured by motion artifact causing blurring. 2. Dilated bowel compatible with small bowel obstruction.   Electronically Signed   By: Sherryl Barters M.D.   On: 04/23/2014 23:55   Dg Abd Portable 1v  04/23/2014   CLINICAL DATA:  Tube placement.  EXAM: PORTABLE ABDOMEN - 1 VIEW  COMPARISON:  CT 04/23/2014.  Abdomen 08/17/2011  FINDINGS: An enteric tube is been placed. The tip is at the left medial costophrenic angle consistent with location at the EG junction. Proximal side hole is probably in the distal esophagus. Visualized small bowel demonstrate mild gaseous distention. Residual contrast material in the bladder.  IMPRESSION: Negative.   Electronically  Signed   By: Lucienne Capers M.D.   On: 04/23/2014 22:36    Scheduled Meds: . antiseptic oral rinse  15 mL Mouth Rinse BID  . enoxaparin (LOVENOX) injection  30 mg Subcutaneous Q24H  . insulin aspart  0-9 Units Subcutaneous 6 times per day   Continuous Infusions: . dextrose 5 % and 0.9% NaCl 75 mL/hr at 04/27/14 2118    Principal Problem:   SBO (small bowel obstruction) Active Problems:   HTN (hypertension), benign   DM (diabetes mellitus), type 2, uncontrolled   Abdominal pain, unspecified site    Time spent: 40 minutes   [facility] Pager [phone]. If 8PM-8AM, please contact night-coverage at [email or web address], password [facility] 04/28/2014, 10:27 AM  LOS: 5 days

## 2014-04-29 ENCOUNTER — Inpatient Hospital Stay (HOSPITAL_COMMUNITY): Payer: PRIVATE HEALTH INSURANCE | Admitting: Anesthesiology

## 2014-04-29 ENCOUNTER — Encounter (HOSPITAL_COMMUNITY): Payer: PRIVATE HEALTH INSURANCE | Admitting: Anesthesiology

## 2014-04-29 ENCOUNTER — Encounter (HOSPITAL_COMMUNITY): Admission: EM | Disposition: A | Discharge status: Home Health | Payer: Self-pay | Source: Home / Self Care

## 2014-04-29 ENCOUNTER — Encounter (HOSPITAL_COMMUNITY): Payer: Self-pay | Admitting: Anesthesiology

## 2014-04-29 DIAGNOSIS — K436 Other and unspecified ventral hernia with obstruction, without gangrene: Secondary | ICD-10-CM

## 2014-04-29 DIAGNOSIS — K56609 Unspecified intestinal obstruction, unspecified as to partial versus complete obstruction: Secondary | ICD-10-CM | POA: Diagnosis not present

## 2014-04-29 DIAGNOSIS — K565 Intestinal adhesions [bands], unspecified as to partial versus complete obstruction: Secondary | ICD-10-CM | POA: Diagnosis not present

## 2014-04-29 HISTORY — PX: LYSIS OF ADHESION: SHX5961

## 2014-04-29 HISTORY — PX: LAPAROTOMY: SHX154

## 2014-04-29 HISTORY — PX: VENTRAL HERNIA REPAIR: SHX424

## 2014-04-29 LAB — COMPREHENSIVE METABOLIC PANEL
ALBUMIN: 2.5 g/dL — AB (ref 3.5–5.2)
ALK PHOS: 53 U/L (ref 39–117)
ALT: 23 U/L (ref 0–35)
AST: 31 U/L (ref 0–37)
BUN: 9 mg/dL (ref 6–23)
CO2: 25 mEq/L (ref 19–32)
CREATININE: 0.85 mg/dL (ref 0.50–1.10)
Calcium: 8.5 mg/dL (ref 8.4–10.5)
Chloride: 108 mEq/L (ref 96–112)
GFR calc Af Amer: 72 mL/min — ABNORMAL LOW (ref >90)
GFR calc non Af Amer: 62 mL/min — ABNORMAL LOW (ref >90)
Glucose, Bld: 131 mg/dL — ABNORMAL HIGH (ref 70–99)
POTASSIUM: 3.1 meq/L — AB (ref 3.7–5.3)
Sodium: 144 mEq/L (ref 137–147)
TOTAL PROTEIN: 5.4 g/dL — AB (ref 6.0–8.3)
Total Bilirubin: 0.3 mg/dL (ref 0.3–1.2)

## 2014-04-29 LAB — GLUCOSE, CAPILLARY
GLUCOSE-CAPILLARY: 139 mg/dL — AB (ref 70–99)
GLUCOSE-CAPILLARY: 129 mg/dL — AB (ref 70–99)
Glucose-Capillary: 117 mg/dL — ABNORMAL HIGH (ref 70–99)
Glucose-Capillary: 184 mg/dL — ABNORMAL HIGH (ref 70–99)
Glucose-Capillary: 164 mg/dL — ABNORMAL HIGH (ref 70–99)

## 2014-04-29 LAB — CBC
HEMATOCRIT: 30.1 % — AB (ref 36.0–46.0)
Hemoglobin: 9.8 g/dL — ABNORMAL LOW (ref 12.0–15.0)
MCH: 27.9 pg (ref 26.0–34.0)
MCHC: 32.6 g/dL (ref 30.0–36.0)
MCV: 85.8 fL (ref 78.0–100.0)
Platelets: 136 10*3/uL — ABNORMAL LOW (ref 150–400)
RBC: 3.51 MIL/uL — AB (ref 3.87–5.11)
RDW: 13.5 % (ref 11.5–15.5)
WBC: 8.4 10*3/uL (ref 4.0–10.5)

## 2014-04-29 LAB — MRSA PCR SCREENING: MRSA by PCR: NEGATIVE

## 2014-04-29 LAB — PREALBUMIN: Prealbumin: 9 mg/dL — ABNORMAL LOW (ref 17.0–34.0)

## 2014-04-29 LAB — PHOSPHORUS: Phosphorus: 2.2 mg/dL — ABNORMAL LOW (ref 2.3–4.6)

## 2014-04-29 LAB — TRIGLYCERIDES: Triglycerides: 95 mg/dL (ref <150)

## 2014-04-29 LAB — MAGNESIUM: Magnesium: 1.8 mg/dL (ref 1.5–2.5)

## 2014-04-29 SURGERY — LAPAROTOMY, EXPLORATORY
Anesthesia: General | Site: Abdomen

## 2014-04-29 MED ORDER — ARTIFICIAL TEARS OP OINT
TOPICAL_OINTMENT | OPHTHALMIC | Status: DC | PRN
  Administered 2014-04-29: 1 via OPHTHALMIC

## 2014-04-29 MED ORDER — ALBUMIN HUMAN 5 % IV SOLN
INTRAVENOUS | Status: DC | PRN
  Administered 2014-04-29: 15:00:00 via INTRAVENOUS

## 2014-04-29 MED ORDER — SODIUM CHLORIDE 0.9 % IJ SOLN
INTRAMUSCULAR | Status: AC
  Filled 2014-04-29: qty 10

## 2014-04-29 MED ORDER — TRACE MINERALS CR-CU-F-FE-I-MN-MO-SE-ZN IV SOLN
INTRAVENOUS | Status: AC
  Administered 2014-04-29: 18:00:00 via INTRAVENOUS
  Filled 2014-04-29: qty 2000

## 2014-04-29 MED ORDER — HYDROMORPHONE HCL PF 1 MG/ML IJ SOLN
0.2500 mg | INTRAMUSCULAR | Status: DC | PRN
Start: 2014-04-29 — End: 2014-04-29
  Administered 2014-04-29: 0.5 mg via INTRAVENOUS
  Administered 2014-04-29 (×2): 0.25 mg via INTRAVENOUS

## 2014-04-29 MED ORDER — ROCURONIUM BROMIDE 100 MG/10ML IV SOLN
INTRAVENOUS | Status: DC | PRN
  Administered 2014-04-29 (×2): 10 mg via INTRAVENOUS
  Administered 2014-04-29: 30 mg via INTRAVENOUS

## 2014-04-29 MED ORDER — EPHEDRINE SULFATE 50 MG/ML IJ SOLN
INTRAMUSCULAR | Status: AC
  Filled 2014-04-29: qty 1

## 2014-04-29 MED ORDER — PHENYLEPHRINE HCL 10 MG/ML IJ SOLN
INTRAMUSCULAR | Status: DC | PRN
  Administered 2014-04-29: 120 ug via INTRAVENOUS
  Administered 2014-04-29: 160 ug via INTRAVENOUS

## 2014-04-29 MED ORDER — DEXTROSE-NACL 5-0.9 % IV SOLN
INTRAVENOUS | Status: DC
  Administered 2014-04-29 – 2014-05-04 (×6): via INTRAVENOUS

## 2014-04-29 MED ORDER — ROCURONIUM BROMIDE 50 MG/5ML IV SOLN
INTRAVENOUS | Status: AC
  Filled 2014-04-29: qty 1

## 2014-04-29 MED ORDER — SODIUM CHLORIDE 0.9 % IV SOLN
INTRAVENOUS | Status: DC | PRN
  Administered 2014-04-29 (×2): via INTRAVENOUS

## 2014-04-29 MED ORDER — 0.9 % SODIUM CHLORIDE (POUR BTL) OPTIME
TOPICAL | Status: DC | PRN
  Administered 2014-04-29: 2000 mL

## 2014-04-29 MED ORDER — FENTANYL CITRATE 0.05 MG/ML IJ SOLN
25.0000 ug | INTRAMUSCULAR | Status: DC | PRN
  Administered 2014-04-29: 100 ug via INTRAVENOUS
  Administered 2014-04-29 (×2): 75 ug via INTRAVENOUS
  Administered 2014-04-29 – 2014-04-30 (×2): 50 ug via INTRAVENOUS
  Administered 2014-04-30: 100 ug via INTRAVENOUS
  Administered 2014-04-30: 75 ug via INTRAVENOUS
  Administered 2014-04-30: 25 ug via INTRAVENOUS
  Administered 2014-04-30 (×2): 50 ug via INTRAVENOUS
  Administered 2014-05-01 (×2): 100 ug via INTRAVENOUS
  Administered 2014-05-01 – 2014-05-02 (×2): 50 ug via INTRAVENOUS
  Administered 2014-05-02 – 2014-05-04 (×8): 100 ug via INTRAVENOUS
  Administered 2014-05-05: 50 ug via INTRAVENOUS
  Administered 2014-05-05 – 2014-05-07 (×3): 100 ug via INTRAVENOUS
  Filled 2014-04-29 (×27): qty 2

## 2014-04-29 MED ORDER — FENTANYL CITRATE 0.05 MG/ML IJ SOLN
INTRAMUSCULAR | Status: DC | PRN
Start: 2014-04-29 — End: 2014-04-29
  Administered 2014-04-29: 50 ug via INTRAVENOUS

## 2014-04-29 MED ORDER — GLYCOPYRROLATE 0.2 MG/ML IJ SOLN
INTRAMUSCULAR | Status: AC
  Filled 2014-04-29: qty 2

## 2014-04-29 MED ORDER — FENTANYL CITRATE 0.05 MG/ML IJ SOLN
INTRAMUSCULAR | Status: AC
  Filled 2014-04-29: qty 5

## 2014-04-29 MED ORDER — ONDANSETRON HCL 4 MG/2ML IJ SOLN
INTRAMUSCULAR | Status: AC
  Filled 2014-04-29: qty 2

## 2014-04-29 MED ORDER — PROPOFOL 10 MG/ML IV BOLUS
INTRAVENOUS | Status: AC
  Filled 2014-04-29: qty 20

## 2014-04-29 MED ORDER — ARTIFICIAL TEARS OP OINT
TOPICAL_OINTMENT | OPHTHALMIC | Status: AC
  Filled 2014-04-29: qty 3.5

## 2014-04-29 MED ORDER — ENOXAPARIN SODIUM 30 MG/0.3ML ~~LOC~~ SOLN
30.0000 mg | SUBCUTANEOUS | Status: DC
  Administered 2014-04-30: 30 mg via SUBCUTANEOUS
  Filled 2014-04-29 (×2): qty 0.3

## 2014-04-29 MED ORDER — GLYCOPYRROLATE 0.2 MG/ML IJ SOLN
INTRAMUSCULAR | Status: DC | PRN
  Administered 2014-04-29: 0.6 mg via INTRAVENOUS

## 2014-04-29 MED ORDER — PHENYLEPHRINE 40 MCG/ML (10ML) SYRINGE FOR IV PUSH (FOR BLOOD PRESSURE SUPPORT)
PREFILLED_SYRINGE | INTRAVENOUS | Status: AC
  Filled 2014-04-29: qty 10

## 2014-04-29 MED ORDER — HYDROMORPHONE HCL PF 1 MG/ML IJ SOLN
INTRAMUSCULAR | Status: AC
  Filled 2014-04-29: qty 1

## 2014-04-29 MED ORDER — CEFOXITIN SODIUM 2 G IV SOLR
2.0000 g | Freq: Once | INTRAVENOUS | Status: AC
  Administered 2014-04-29: 2 g via INTRAVENOUS
  Filled 2014-04-29: qty 2

## 2014-04-29 MED ORDER — PHENYLEPHRINE HCL 10 MG/ML IJ SOLN
10.0000 mg | INTRAVENOUS | Status: DC | PRN
  Administered 2014-04-29: 15 ug/min via INTRAVENOUS

## 2014-04-29 MED ORDER — ONDANSETRON HCL 4 MG/2ML IJ SOLN
INTRAMUSCULAR | Status: DC | PRN
  Administered 2014-04-29: 4 mg via INTRAVENOUS

## 2014-04-29 MED ORDER — NEOSTIGMINE METHYLSULFATE 10 MG/10ML IV SOLN
INTRAVENOUS | Status: DC | PRN
  Administered 2014-04-29: 4 mg via INTRAVENOUS

## 2014-04-29 MED ORDER — SUCCINYLCHOLINE CHLORIDE 20 MG/ML IJ SOLN
INTRAMUSCULAR | Status: DC | PRN
  Administered 2014-04-29: 80 mg via INTRAVENOUS

## 2014-04-29 MED ORDER — PROPOFOL 10 MG/ML IV BOLUS
INTRAVENOUS | Status: DC | PRN
  Administered 2014-04-29: 70 mg via INTRAVENOUS

## 2014-04-29 MED ORDER — FAT EMULSION 20 % IV EMUL
250.0000 mL | INTRAVENOUS | Status: AC
Start: 2014-04-29 — End: 2014-04-30
  Administered 2014-04-29: 250 mL via INTRAVENOUS
  Filled 2014-04-29: qty 250

## 2014-04-29 SURGICAL SUPPLY — 47 items
BLADE SURG ROTATE 9660 (MISCELLANEOUS) IMPLANT
CANISTER SUCTION 2500CC (MISCELLANEOUS) ×3 IMPLANT
CHLORAPREP W/TINT 26ML (MISCELLANEOUS) ×3 IMPLANT
COVER MAYO STAND STRL (DRAPES) IMPLANT
COVER SURGICAL LIGHT HANDLE (MISCELLANEOUS) ×3 IMPLANT
DRAPE LAPAROSCOPIC ABDOMINAL (DRAPES) ×3 IMPLANT
DRAPE PROXIMA HALF (DRAPES) IMPLANT
DRAPE UTILITY 15X26 W/TAPE STR (DRAPE) ×6 IMPLANT
DRAPE WARM FLUID 44X44 (DRAPE) ×3 IMPLANT
DRSG MEPILEX BORDER 4X12 (GAUZE/BANDAGES/DRESSINGS) ×2 IMPLANT
DRSG OPSITE POSTOP 4X10 (GAUZE/BANDAGES/DRESSINGS) IMPLANT
DRSG OPSITE POSTOP 4X8 (GAUZE/BANDAGES/DRESSINGS) IMPLANT
ELECT BLADE 6.5 EXT (BLADE) IMPLANT
ELECT CAUTERY BLADE 6.4 (BLADE) ×4 IMPLANT
ELECT REM PT RETURN 9FT ADLT (ELECTROSURGICAL) ×3
ELECTRODE REM PT RTRN 9FT ADLT (ELECTROSURGICAL) ×1 IMPLANT
GLOVE BIO SURGEON STRL SZ7 (GLOVE) ×3 IMPLANT
GLOVE BIOGEL PI IND STRL 7.5 (GLOVE) ×1 IMPLANT
GLOVE BIOGEL PI IND STRL 8 (GLOVE) IMPLANT
GLOVE BIOGEL PI INDICATOR 7.5 (GLOVE) ×4
GLOVE BIOGEL PI INDICATOR 8 (GLOVE) ×2
GLOVE ECLIPSE 7.0 STRL STRAW (GLOVE) ×2 IMPLANT
GLOVE SURG SS PI 7.5 STRL IVOR (GLOVE) ×2 IMPLANT
GOWN STRL REUS W/ TWL LRG LVL3 (GOWN DISPOSABLE) ×3 IMPLANT
GOWN STRL REUS W/TWL LRG LVL3 (GOWN DISPOSABLE) ×9
KIT BASIN OR (CUSTOM PROCEDURE TRAY) ×3 IMPLANT
KIT ROOM TURNOVER OR (KITS) ×3 IMPLANT
LIGASURE IMPACT 36 18CM CVD LR (INSTRUMENTS) IMPLANT
NS IRRIG 1000ML POUR BTL (IV SOLUTION) ×6 IMPLANT
PACK GENERAL/GYN (CUSTOM PROCEDURE TRAY) ×3 IMPLANT
PAD ARMBOARD 7.5X6 YLW CONV (MISCELLANEOUS) ×3 IMPLANT
PENCIL BUTTON HOLSTER BLD 10FT (ELECTRODE) IMPLANT
SPECIMEN JAR LARGE (MISCELLANEOUS) IMPLANT
SPONGE LAP 18X18 X RAY DECT (DISPOSABLE) ×2 IMPLANT
STAPLER VISISTAT 35W (STAPLE) ×3 IMPLANT
SUCTION POOLE TIP (SUCTIONS) ×3 IMPLANT
SUT PDS AB 1 TP1 96 (SUTURE) ×6 IMPLANT
SUT SILK 2 0 SH CR/8 (SUTURE) ×3 IMPLANT
SUT SILK 2 0 TIES 10X30 (SUTURE) ×3 IMPLANT
SUT SILK 3 0 SH CR/8 (SUTURE) ×5 IMPLANT
SUT SILK 3 0 TIES 10X30 (SUTURE) ×3 IMPLANT
SUT VIC AB 3-0 SH 18 (SUTURE) IMPLANT
TOWEL OR 17X26 10 PK STRL BLUE (TOWEL DISPOSABLE) ×3 IMPLANT
TRAY FOLEY CATH 16FRSI W/METER (SET/KITS/TRAYS/PACK) ×2 IMPLANT
TUBE CONNECTING 12'X1/4 (SUCTIONS)
TUBE CONNECTING 12X1/4 (SUCTIONS) IMPLANT
YANKAUER SUCT BULB TIP NO VENT (SUCTIONS) IMPLANT

## 2014-04-29 NOTE — Progress Notes (Signed)
Patient ID: Erika Day, female   DOB: 1930-02-10, 80 y.o.   MRN: [id number]    Subjective: Pt feels ok with NG on suction, 771ml bilious output yesterday, denies abd pain, ready for surgery if needed.  PICC in place and TPN started.  Husband and daughter in the room.  Objective: Vital signs in last 24 hours: Temp:  [98.7 F (37.1 C)-99 F (37.2 C)] 99 F (37.2 C) (06/28 0454) Pulse Rate:  [61-85] 61 (06/28 0454) Resp:  [18] 18 (06/28 0454) BP: (143-152)/(58-84) 143/65 mmHg (06/28 0454) SpO2:  [95 %-99 %] 99 % (06/28 0454) Weight:  [168 lb 3.4 oz (76.301 kg)] 168 lb 3.4 oz (76.301 kg) 05-04-2023 2037) Last BM Date: 04/26/14  Intake/Output from previous day: 05/04/2023 0701 - 06/28 0700 In: 973.8 [P.O.:150; I.V.:823.8] Out: 700 [Emesis/NG output:700] Intake/Output this shift:    PE:  NAD Abd: soft, but distended, NT   Lab Results:   Recent Labs  04/29/14 0603  WBC 8.4  HGB 9.8*  HCT 30.1*  PLT 136*   BMET  Recent Labs  04/29/14 0603  NA 144  K 3.1*  CL 108  CO2 25  GLUCOSE 131*  BUN 9  CREATININE 0.85  CALCIUM 8.5   PT/INR No results found for this basename: LABPROT, INR,  in the last 72 hours CMP     Component Value Date/Time   NA 144 04/29/2014 0603   K 3.1* 04/29/2014 0603   CL 108 04/29/2014 0603   CO2 25 04/29/2014 0603   GLUCOSE 131* 04/29/2014 0603   BUN 9 04/29/2014 0603   CREATININE 0.85 04/29/2014 0603   CALCIUM 8.5 04/29/2014 0603   PROT 5.4* 04/29/2014 0603   ALBUMIN 2.5* 04/29/2014 0603   AST 31 04/29/2014 0603   ALT 23 04/29/2014 0603   ALKPHOS 53 04/29/2014 0603   BILITOT 0.3 04/29/2014 0603   GFRNONAA 62* 04/29/2014 0603   GFRAA 72* 04/29/2014 0603   Lipase     Component Value Date/Time   LIPASE 34 04/23/2014 1052       Studies/Results: Dg Abd 2 Views  05-03-14   CLINICAL DATA:  Small bowel obstruction.  EXAM: ABDOMEN - 2 VIEW  COMPARISON:  04/27/2014.  FINDINGS: Multiple dilated loops of gas-filled small bowel are noted in the central  abdomen measuring up to 5.5 cm in diameter. Multiple air-fluid levels are noted on the decubitus view. No pneumoperitoneum. Paucity of colonic gas. Tip of nasogastric tube is in the distal stomach. Surgical clips in the right upper quadrant of the abdomen, compatible with prior cholecystectomy.  IMPRESSION: 1. Bowel gas pattern remains compatible with small bowel obstruction, as above. 2. No pneumoperitoneum. 3. Tip of nasogastric tube is in the distal stomach.   Electronically Signed   By: Vinnie Langton M.D.   On: 2014-05-03 09:52   Dg Abd 2 Views  04/27/2014   CLINICAL DATA:  Follow-up small bowel obstruction  EXAM: ABDOMEN - 2 VIEW  COMPARISON:  04/26/2014  FINDINGS: Numerous dilated small bowel loops with multiple small bowel air-fluid levels without significant interval change compared with 04/26/2014. Nasogastric 2 present within the stomach. There is no evidence of pneumoperitoneum, portal venous gas or pneumatosis. There are no pathologic calcifications along the expected course of the ureters.  Degenerative changes of the lower lumbar spine.  IMPRESSION: Persistent small bowel obstruction without significant interval compared with 04/26/2014.   Electronically Signed   By: Kathreen Devoid   On: 04/27/2014 13:48    Anti-infectives: Anti-infectives  None       Assessment/Plan  1. PSBO  Plan: 1. Patient not resolving SBO medical management.  Has been without substantial nutrition for ~1 week.  TPN started.  We discussed possible surgery.  She is agreeable to this.  She understands the risks of surgery, which include bleeding, infection, possible bowel resection, prolonged ileus or hospitalization, repsiraory failure, cardiac complications and death.  I believe the benefits of surgery outweigh the risks and have recommended exploratory laparotomy with LOA.   LOS: 6 days    THOMAS, ALICIA C. 9/41/7408, 14:48 AM

## 2014-04-29 NOTE — Anesthesia Postprocedure Evaluation (Signed)
  Anesthesia Post-op Note  Patient: Erika Day  Procedure(s) Performed: Procedure(s): EXPLORATORY LAPAROTOMY (N/A) PRIMARY REPAIR OF VENTRAL HERNIA (N/A) EXTENSIVE LYSIS OF ADHESIONS (N/A)  Patient Location: PACU  Anesthesia Type:General  Level of Consciousness: awake and alert   Airway and Oxygen Therapy: Patient Spontanous Breathing  Post-op Pain: moderate  Post-op Assessment: Post-op Vital signs reviewed, Patient's Cardiovascular Status Stable and Respiratory Function Stable  Post-op Vital Signs: Reviewed  Filed Vitals:   04/29/14 1640  BP: 120/55  Pulse: 67  Temp:   Resp: 20    Complications: No apparent anesthesia complications

## 2014-04-29 NOTE — Progress Notes (Signed)
[facility] PROGRESS NOTE  Erika Day YIF:[id number] DOB: 02-22-1930 DOA: 04/23/2014 PCP: Philis Fendt, MD  Assessment/Plan: Principal Problem:   SBO (small bowel obstruction) Active Problems:   HTN (hypertension), benign   DM (diabetes mellitus), type 2, uncontrolled   Abdominal pain, unspecified site    SBO (small bowel obstruction)  Appears to be a little bit worse today  -NG tube back to suction, 700 cc output No Abdominal KUB today Not passing any flatus  Patient will be made n.p.o. except ice chips Encouraged to ambulate  Surgery to make a decision today Status post PICC line insertion with TPN Given medical comorbidities the patient is a moderate to high risk for surgery, DAUGHTER agreeable to proceed  HTN (hypertension), soft but stable  Continue D5 normal saline IV fluids, holding verapamil and lisinopril   DM (diabetes mellitus), type 2,  Now on TPN- Continue sliding scale insulin , continue Accu-Cheks    Anxiety:  - Placed on Ativan as needed  DVT Prophylaxis: Lovenox  Code Status: Full code  Family Communication: Discussed with patient and daughter at the bedside  Disposition: Depends on surgical recommendations  Consultants:  General surgery Procedures:  NGT suction Antibiotics:  None  HPI/Subjective:  Complaining of bloating and belching today, has not passed any flatness since yesterday, no BM since yesterday 700 cc of NG tube output   Objective: Filed Vitals:   04/28/14 0933 04/28/14 1824 04/28/14 2037 04/29/14 0454  BP: 150/55 145/84 152/58 143/65  Pulse: 58 85 68 61  Temp: 98.2 F (36.8 C) 98.9 F (37.2 C) 98.7 F (37.1 C) 99 F (37.2 C)  TempSrc: Oral Oral Oral Oral  Resp: 16 18 18 18   Weight:   76.301 kg (168 lb 3.4 oz)   SpO2: 98% 95% 98% 99%    Intake/Output Summary (Last 24 hours) at 04/29/14 1044 Last data filed at 04/28/14 1823  Gross per 24 hour  Intake 853.75 ml  Output    700 ml  Net 153.75 ml     Exam:  HENT:  Head: Atraumatic.  Nose: Nose normal.  Mouth/Throat: Oropharynx is clear and moist.  Eyes: Conjunctivae are normal. Pupils are equal, round, and reactive to light. No scleral icterus.  Neck: Neck supple. No tracheal deviation present.  Cardiovascular: Normal rate, regular rhythm, normal heart sounds and intact distal pulses.  Pulmonary/Chest: Effort normal and breath sounds normal. No respiratory distress.  Abdominal: Soft. Normal appearance and bowel sounds are normal. She exhibits no distension. There is no tenderness.  Musculoskeletal: She exhibits no edema and no tenderness.  Neurological: She is alert. No cranial nerve deficit.    Data Reviewed: Basic Metabolic Panel:  Recent Labs Lab 04/23/14 1052 04/24/14 0421 04/25/14 0505 04/25/14 1143 04/29/14 0603  NA 139 141 146 145 144  K 5.0 4.4 3.8 4.3 3.1*  CL 101 107 106 106 108  CO2 26 21 19 20 25   GLUCOSE 152* 74 51* 134* 131*  BUN 11 12 18 19 9   CREATININE 1.04 1.15* 1.20* 1.18* 0.85  CALCIUM 10.5 9.3 9.5 9.5 8.5  MG  --   --   --   --  1.8  PHOS  --   --   --   --  2.2*    Liver Function Tests:  Recent Labs Lab 04/23/14 1052 04/24/14 0421 04/29/14 0603  AST 13 12 31   ALT 6 5 23   ALKPHOS 77 60 53  BILITOT 0.3 0.3 0.3  PROT 7.2 5.6* 5.4*  ALBUMIN 3.7 2.8* 2.5*    Recent Labs Lab 04/23/14 1052  LIPASE 34   No results found for this basename: AMMONIA,  in the last 168 hours  CBC:  Recent Labs Lab 04/23/14 1052 04/24/14 0421 04/25/14 0505 04/26/14 0518 04/29/14 0603  WBC 7.7 10.2 7.6 7.9 8.4  NEUTROABS 3.7  --  2.6  --   --   HGB 12.4 10.5* 10.3* 10.0* 9.8*  HCT 37.5 32.1* 32.2* 30.8* 30.1*  MCV 85.0 85.4 85.4 87.3 85.8  PLT 160 147* 157 135* 136*    Cardiac Enzymes: No results found for this basename: CKTOTAL, CKMB, CKMBINDEX, TROPONINI,  in the last 168 hours BNP (last 3 results) No results found for this basename: PROBNP,  in the last 8760 hours   CBG:  Recent  Labs Lab 04/28/14 1706 04/28/14 2036 04/29/14 04/29/14 0451 04/29/14 0949  GLUCAP 87 129* 115* 139* 129*    No results found for this or any previous visit (from the past 240 hour(s)).   Studies: Ct Abdomen Pelvis W Contrast  04/23/2014   CLINICAL DATA:  Epigastric pain and vomiting. History of cholecystectomy.  EXAM: CT ABDOMEN AND PELVIS WITH CONTRAST  TECHNIQUE: Multidetector CT imaging of the abdomen and pelvis was performed using the standard protocol following bolus administration of intravenous contrast.  CONTRAST:  185mL OMNIPAQUE IOHEXOL 300 MG/ML  SOLN  COMPARISON:  08/17/2011  FINDINGS: Subsegmental atelectasis is present in the lung bases. 3 vessel coronary artery calcifications are noted.  The gallbladder is surgically absent. The liver, spleen, adrenal glands, kidneys, and pancreas have an unremarkable enhanced appearance. Mild dilatation of the common bile duct is unchanged and likely related to prior cholecystectomy.  There is new, moderate volume ascites. There is moderate dilatation of multiple fluid-filled small bowel loops measuring up to approximately 4 cm in diameter. A small amount of pneumatosis is questioned in a small bowel loop in the right mid abdomen (series 2, images 37 and 39). The distal ileum and colon are largely decompressed. There is a small bowel containing ventral hernia, although contrast does pass beyond this, and the level of apparent small bowel obstruction is quite distal to this.  Sequelae of interval hysterectomy are identified. There is a partially calcified 4.8 x 3.9 cm soft tissue mass in the right hemipelvis. Cystic adnexal mass has been resected. There is soft tissue fullness in the peritoneum, stable to slightly more prominent than on the prior study. Right inguinal lymph nodes measure up to 1.3 cm in short axis, mildly increased in size compared to the prior study. Bladder is partially decompressed with possible mild bladder wall thickening. Advanced  atherosclerotic calcification is noted of the abdominal aorta and common iliac arteries. Sclerosis about the right greater than left sacroiliac joints is unchanged. Advanced disc degeneration is seen throughout the lower thoracic and lumbar spine.  IMPRESSION: 1. High-grade small bowel obstruction with transition to decompressed distal small bowel in the right mid abdomen. Early pneumatosis is questioned in a small bowel loop in the right mid abdomen. 2. Interval hysterectomy with resection of the cystic mass described on the prior CT. Partially calcified soft tissue nodule is present in the posterior right pelvis, and this is concerning for a metastatic deposit. 3. Mild interval enlargement of right inguinal lymph nodes, nonspecific however nodal metastatic disease is possible. 4. Soft tissue fullness in the perineum, stable to slightly more prominent than on the prior study. This may reflect pelvic floor laxity, although clinical correlation is recommended to  excluded metastatic disease in the low pelvis.   Electronically Signed   By: Logan Bores   On: 04/23/2014 12:50   Dg Abd 2 Views  04/28/2014   CLINICAL DATA:  Small bowel obstruction.  EXAM: ABDOMEN - 2 VIEW  COMPARISON:  04/27/2014.  FINDINGS: Multiple dilated loops of gas-filled small bowel are noted in the central abdomen measuring up to 5.5 cm in diameter. Multiple air-fluid levels are noted on the decubitus view. No pneumoperitoneum. Paucity of colonic gas. Tip of nasogastric tube is in the distal stomach. Surgical clips in the right upper quadrant of the abdomen, compatible with prior cholecystectomy.  IMPRESSION: 1. Bowel gas pattern remains compatible with small bowel obstruction, as above. 2. No pneumoperitoneum. 3. Tip of nasogastric tube is in the distal stomach.   Electronically Signed   By: Vinnie Langton M.D.   On: 04/28/2014 09:52   Dg Abd 2 Views  04/27/2014   CLINICAL DATA:  Follow-up small bowel obstruction  EXAM: ABDOMEN - 2 VIEW   COMPARISON:  04/26/2014  FINDINGS: Numerous dilated small bowel loops with multiple small bowel air-fluid levels without significant interval change compared with 04/26/2014. Nasogastric 2 present within the stomach. There is no evidence of pneumoperitoneum, portal venous gas or pneumatosis. There are no pathologic calcifications along the expected course of the ureters.  Degenerative changes of the lower lumbar spine.  IMPRESSION: Persistent small bowel obstruction without significant interval compared with 04/26/2014.   Electronically Signed   By: Kathreen Devoid   On: 04/27/2014 13:48   Dg Abd 2 Views  04/26/2014   CLINICAL DATA:  Abdominal pain, small bowel obstruction  EXAM: ABDOMEN - 2 VIEW  COMPARISON:  04/25/2014  FINDINGS: Nasogastric tube is appropriately positioned. Slight decrease in gaseous prominence of multiple central loops of small bowel. Maximal diameter 3.4 cm compared to 4.6 cm previously. Several presumed colonic air-fluid levels are noted at decubitus imaging, with several differential small bowel air-fluid levels. Extensive degenerative change of the lumbar spine reidentified. Ingested material or metallic foreign bodies are reidentified over the pelvis. Pelvic phleboliths versus vascular calcifications are again noted. Cholecystectomy clips are noted.  IMPRESSION: Improving small bowel obstruction with decrease in caliber of small bowel dilatation.   Electronically Signed   By: Conchita Paris M.D.   On: 04/26/2014 08:16   Dg Abd 2 Views  04/25/2014   CLINICAL DATA:  Small bowel obstruction.  EXAM: ABDOMEN - 2 VIEW  COMPARISON:  04/24/2014  FINDINGS: NG tube tip is in the distal stomach. Small bowel dilatation in the mid abdomen, minimally improved since prior study. Small bowel diameter approximately 4-5 cm. No free air or organomegaly. Prior cholecystectomy.  Low lung volumes with bibasilar atelectasis.  IMPRESSION: Slight interval decreased distention of mid small bowel loops. Continued  small bowel obstruction pattern.   Electronically Signed   By: Rolm Baptise M.D.   On: 04/25/2014 08:24   Dg Abd 2 Views  04/24/2014   CLINICAL DATA:  Abdominal pain.  Followup small bowel obstruction.  EXAM: ABDOMEN - 2 VIEW  COMPARISON:  04/23/2014  FINDINGS: Nasogastric tube is in adequate position with side port and tip over the stomach in the left upper quadrant. Examination demonstrates air and stool throughout the colon. There is persistence of several air-filled dilated small bowel loops in a few scattered air-fluid levels without significant overall change. There is no evidence of free peritoneal air. Contrast present over the bladder. Remainder of the exam is unchanged.  IMPRESSION: Persistent air-filled  dilated small bowel loops in the left mid abdomen without significant overall change compatible with known small bowel obstruction.  Nasogastric tube in adequate position.   Electronically Signed   By: Marin Olp M.D.   On: 04/24/2014 08:17   Dg Abd Portable 1v  04/23/2014   CLINICAL DATA:  Nasogastric tube placement, recently advanced.  EXAM: PORTABLE ABDOMEN - 1 VIEW  COMPARISON:  None.  FINDINGS: The nasogastric tube is blurred by motion artifact. Its tip is especially blurred. The tube is noted to extend down into the stomach body and possibly to the antrum, but is not visible distal to this. There appears to be an adequate amount of the tube within the stomach.  Thoracolumbar spondylosis and degenerative disc disease.  Abnormal dilated small bowel, up to 4.4 cm, compatible with small bowel obstruction.  IMPRESSION: 1. Nasogastric tube tip is probably in the stomach body - the tip itself is obscured by motion artifact causing blurring. 2. Dilated bowel compatible with small bowel obstruction.   Electronically Signed   By: Sherryl Barters M.D.   On: 04/23/2014 23:55   Dg Abd Portable 1v  04/23/2014   CLINICAL DATA:  Tube placement.  EXAM: PORTABLE ABDOMEN - 1 VIEW  COMPARISON:  CT  04/23/2014.  Abdomen 08/17/2011  FINDINGS: An enteric tube is been placed. The tip is at the left medial costophrenic angle consistent with location at the EG junction. Proximal side hole is probably in the distal esophagus. Visualized small bowel demonstrate mild gaseous distention. Residual contrast material in the bladder.  IMPRESSION: Negative.   Electronically Signed   By: Lucienne Capers M.D.   On: 04/23/2014 22:36    Scheduled Meds: . antiseptic oral rinse  15 mL Mouth Rinse BID  . enoxaparin (LOVENOX) injection  30 mg Subcutaneous Q24H  . insulin aspart  0-9 Units Subcutaneous 6 times per day  . sodium chloride  10-40 mL Intracatheter Q12H   Continuous Infusions: . dextrose 5 % and 0.9% NaCl 35 mL/hr (04/28/14 1800)  . dextrose 5 % and 0.9% NaCl    . Marland KitchenTPN (CLINIMIX-E) Adult 40 mL/hr at 04/28/14 1735   And  . fat emulsion 250 mL (04/28/14 1735)  . Marland KitchenTPN (CLINIMIX-E) Adult     And  . fat emulsion      Principal Problem:   SBO (small bowel obstruction) Active Problems:   HTN (hypertension), benign   DM (diabetes mellitus), type 2, uncontrolled   Abdominal pain, unspecified site    Time spent: 40 minutes   [facility] Pager [phone]. If 8PM-8AM, please contact night-coverage at [email or web address], password [email or web address] 04/29/2014, 10:44 AM  LOS: 6 days

## 2014-04-29 NOTE — Anesthesia Procedure Notes (Signed)
Procedure Name: Intubation Date/Time: 04/29/2014 2:14 PM Performed by: Sampson Si E Pre-anesthesia Checklist: Patient identified, Emergency Drugs available, Suction available, Patient being monitored and Timeout performed Patient Re-evaluated:Patient Re-evaluated prior to inductionOxygen Delivery Method: Circle system utilized Preoxygenation: Pre-oxygenation with 100% oxygen Intubation Type: IV induction, Rapid sequence and Cricoid Pressure applied Laryngoscope Size: Mac and 3 Grade View: Grade I Tube type: Oral Tube size: 7.0 mm Number of attempts: 1 Airway Equipment and Method: Stylet Placement Confirmation: ETT inserted through vocal cords under direct vision,  positive ETCO2 and breath sounds checked- equal and bilateral Secured at: 21 cm Tube secured with: Tape Dental Injury: Teeth and Oropharynx as per pre-operative assessment

## 2014-04-29 NOTE — Op Note (Signed)
Preop diagnosis: Small bowel obstruction Periumbilical ventral hernia Postop diagnosis: Same Procedure performed: Exploratory laparotomy with extensive lysis of adhesions (greater than 60 minutes) Primary repair of periumbilical ventral hernia Surgeon:TSUEI,MATTHEW K. Assistant: Saverio Danker, PA-C. Anesthesia: Gen. Endotracheal Indications: This is an 80 year old female with multiple medical comorbidities who presents with several days of Abdominal distention, nausea, vomiting. She was diagnosed with a small bowel obstruction, likely secondary to adhesions from previous surgery. She has been managed nonoperatively to this point with nasogastric decompression. However she has failed to regain bowel function and her x-rays show that her small bowel is becoming more dilated. After discussion with the patient, we have recommended exploratory laparotomy with lysis of adhesions. She also has a periumbilical ventral hernia that contains small bowel but does not appear to be the source of obstruction.  Description of procedure: The patient brought to the operating room and placed in the supine position on the operating room table. After an adequate level of general anesthesia was obtained a Foley catheter was placed in the still technique. The patient's abdomen was prepped with ChloraPrep and draped sterile fashion. A timeout was taken to ensure the proper patient and proper procedure. I open her previous lower midline incision and extended this up around the umbilicus. Dissection was carried down into the subcutaneous tissues with cautery. We identified the hernia sac and opened this with cautery. We into the peritoneal cavity through the hernia sac. The fascial defect seems to be about 4-5 cm across. I extended the fascial incision down towards the pelvis. The small bowel appeared to be dilated but there is no sign of any ischemic bowel. There is no sign of perforation. The patient has considerable amount of  thin yellowish ascites. We suctioned as much as possible. We then began examined the small bowel. The small bowel that had been incarcerated in the ventral hernia showed several small bowel to small bowel adhesions but there's no sign of obstruction. However this area did appear to be fairly dilated  Along with the rest of the bowel. As we continued exploring the abdomen or significant adhesions in the right lower quadrant to the pelvic sidewall. We used Metzenbaum scissors to carefully lyse these extensive adhesions. There were several fairly dense adhesions that seemed to be causing the high-grade partial small bowel obstruction. We spent the next 45 minutes to 60 minutes with meticulous lysis of adhesions. We identified the small bowel at the ligament of Treitz and ran this distally. We took down all the adhesions until we had reached the cecum. There were several small serosal defects were repaired with interrupted 3-0 silk sutures. The nasogastric tube was palpated within the stomach. The colon appeared grossly normal with a fairly redundant sigmoid colon. We irrigated the abdomen thoroughly and inspected for hemostasis. The fascia was reapproximated with double-stranded #1 PDS sutures. Prior to fascial closure we excised the hernia sac as well as the thin umbilical skin. After the fascia was closed we irrigated the subcutaneous tissues with saline. Staples were used to close the skin. An occlusive dressing was applied. The patient was then extubated and brought to recovery in stable condition. All sponge, initially, and needle counts are correct.   Imogene Burn. Georgette Dover, MD, [facility] Surgery  General/ Trauma Surgery  04/29/2014 4:00 PM

## 2014-04-29 NOTE — Transfer of Care (Signed)
Immediate Anesthesia Transfer of Care Note  Patient: Erika Day  Procedure(s) Performed: Procedure(s): EXPLORATORY LAPAROTOMY (N/A) PRIMARY REPAIR OF VENTRAL HERNIA (N/A) EXTENSIVE LYSIS OF ADHESIONS (N/A)  Patient Location: PACU  Anesthesia Type:General  Level of Consciousness: oriented, sedated, patient cooperative and responds to stimulation  Airway & Oxygen Therapy: Patient Spontanous Breathing and Patient connected to face mask oxygen  Post-op Assessment: Report given to PACU RN, Post -op Vital signs reviewed and stable, Patient moving all extremities and Patient moving all extremities X 4  Post vital signs: Reviewed and stable  Complications: No apparent anesthesia complications

## 2014-04-29 NOTE — Progress Notes (Signed)
PARENTERAL NUTRITION CONSULT NOTE - f/u  Pharmacy Consult for TPN Indication: unresolving pSBO  Allergies  Allergen Reactions  . Morphine And Related Other (See Comments)    Blood sugar dropped     Patient Measurements: Weight: 168 lb 3.4 oz (76.301 kg) Height: 4'11'' IBW: 43.2 kg Adjusted Body Weight:  Usual Weight: 76.3  Vital Signs: Temp: 99 F (37.2 C) (06/28 0454) Temp src: Oral (06/28 0454) BP: 143/65 mmHg (06/28 0454) Pulse Rate: 61 (06/28 0454) Intake/Output from previous day: 06/27 0701 - 06/28 0700 In: 973.8 [P.O.:150; I.V.:823.8] Out: 700 [Emesis/NG output:700] Intake/Output from this shift:    Labs:  Recent Labs  04/29/14 0603  WBC 8.4  HGB 9.8*  HCT 30.1*  PLT 136*     Recent Labs  04/29/14 0603  NA 144  K 3.1*  CL 108  CO2 25  GLUCOSE 131*  BUN 9  CREATININE 0.85  CALCIUM 8.5  MG 1.8  PHOS 2.2*  PROT 5.4*  ALBUMIN 2.5*  AST 31  ALT 23  ALKPHOS 53  BILITOT 0.3  TRIG 95   The CrCl is unknown because both a height and weight (above a minimum accepted value) are required for this calculation.    Recent Labs  04/28/14 2036 04/29/14 04/29/14 0451  GLUCAP 129* 115* 139*    Medical History: Past Medical History  Diagnosis Date  . Stroke   . Acid reflux disease   . Panic attacks   . Hypertension   . DDD (degenerative disc disease), lumbar   . Dyslipidemia   . Degenerative joint disease     Insulin Requirements in the past 24 hours:  2 units SSI (CBG [phone] since starting TPN last PM)  Current Nutrition:  NPO  Admit: Abdominal pain. N/V/D Assessment: pSBO on CT  GI: h/o SBO. Has been passing flatus and having BM during this admission (last 6/25). 6/26 abd Xray worsened; KUB with persistent SBO. Has not tolerated having NG tubed clamped. Start TPN 6/27.  Endo: DM. A1C 5.4. CBGs ok on SSI. Lytes: K=3.1 low. Phos slightly low at 2.2 Renal: Scr 0.84 ok. UOP not measured Pulm: 99% Cards: HTN, HLD. VSS Hepatobil:  LFTs WNL Neuro: h/o CVA, anxiety ID:   Afebrile. WBC 8.4. No abx. Heme/Onc: Hgb 9.8 Best Practices: MC, LMWH TPN Access: PICC 6/27 TPN day#:1   Nutritional Goals:  Kcal: 1350 - 1500  Protein: 70 - 80 g Goal: Clinimix 5/15 at 43ml/hr with lipids at 71ml/hr = 1358 kcal and 72g protein  Plan:  Increase Clinimix 5/15 to goal 5ml/hr with lipids at 64ml/hr  [facility] IVF F/u Prealbumin   Erika Day, PharmD, BCPS Clinical Staff Pharmacist Pager [phone]  Erika Day 04/29/2014,8:59 AM

## 2014-04-29 NOTE — Progress Notes (Signed)
Pt up from PACU at 1730 post op exploratory lap. Pt stable and responsive. Dressing c/d/i. Will continue to monitor.

## 2014-04-29 NOTE — Anesthesia Preprocedure Evaluation (Addendum)
Anesthesia Evaluation  Patient identified by MRN, date of birth, ID band Patient awake    Reviewed: Allergy & Precautions, H&P , NPO status , Patient's Chart, lab work & pertinent test results  Airway       Dental no notable dental hx. (+) Edentulous Upper, Dental Advisory Given, Partial Lower, Loose, Missing, Poor Dentition,    Pulmonary neg pulmonary ROS, former smoker,    Pulmonary exam normal       Cardiovascular hypertension, Pt. on medications     Neuro/Psych Anxiety CVA negative psych ROS   GI/Hepatic Neg liver ROS, GERD-  Medicated,  Endo/Other  negative endocrine ROS  Renal/GU negative Renal ROS  negative genitourinary   Musculoskeletal   Abdominal   Peds  Hematology negative hematology ROS (+)   Anesthesia Other Findings   Reproductive/Obstetrics negative OB ROS                          Anesthesia Physical Anesthesia Plan  ASA: II and emergent  Anesthesia Plan: General   Post-op Pain Management:    Induction: Intravenous, Rapid sequence and Cricoid pressure planned  Airway Management Planned: Oral ETT  Additional Equipment:   Intra-op Plan:   Post-operative Plan: Extubation in OR  Informed Consent: I have reviewed the patients History and Physical, chart, labs and discussed the procedure including the risks, benefits and alternatives for the proposed anesthesia with the patient or authorized representative who has indicated his/her understanding and acceptance.   Dental advisory given  Plan Discussed with: CRNA  Anesthesia Plan Comments:         Anesthesia Quick Evaluation

## 2014-04-29 NOTE — OR Nursing (Signed)
Foley catheter below level of bed and secured to table at Anesthesia during operative procedure/AMasonRN

## 2014-04-30 DIAGNOSIS — E876 Hypokalemia: Secondary | ICD-10-CM

## 2014-04-30 DIAGNOSIS — R5381 Other malaise: Secondary | ICD-10-CM

## 2014-04-30 DIAGNOSIS — R339 Retention of urine, unspecified: Secondary | ICD-10-CM

## 2014-04-30 DIAGNOSIS — E119 Type 2 diabetes mellitus without complications: Secondary | ICD-10-CM

## 2014-04-30 LAB — COMPREHENSIVE METABOLIC PANEL
ALT: 23 U/L (ref 0–35)
AST: 22 U/L (ref 0–37)
Albumin: 2.5 g/dL — ABNORMAL LOW (ref 3.5–5.2)
Alkaline Phosphatase: 50 U/L (ref 39–117)
BILIRUBIN TOTAL: 0.4 mg/dL (ref 0.3–1.2)
BUN: 16 mg/dL (ref 6–23)
CHLORIDE: 108 meq/L (ref 96–112)
CO2: 23 mEq/L (ref 19–32)
CREATININE: 0.96 mg/dL (ref 0.50–1.10)
Calcium: 8.2 mg/dL — ABNORMAL LOW (ref 8.4–10.5)
GFR calc non Af Amer: 54 mL/min — ABNORMAL LOW (ref >90)
GFR, EST AFRICAN AMERICAN: 62 mL/min — AB (ref >90)
GLUCOSE: 155 mg/dL — AB (ref 70–99)
POTASSIUM: 3.4 meq/L — AB (ref 3.7–5.3)
Sodium: 143 mEq/L (ref 137–147)
Total Protein: 5.2 g/dL — ABNORMAL LOW (ref 6.0–8.3)

## 2014-04-30 LAB — CBC
HCT: 34.2 % — ABNORMAL LOW (ref 36.0–46.0)
HEMOGLOBIN: 11 g/dL — AB (ref 12.0–15.0)
MCH: 27.9 pg (ref 26.0–34.0)
MCHC: 32.2 g/dL (ref 30.0–36.0)
MCV: 86.8 fL (ref 78.0–100.0)
Platelets: 146 10*3/uL — ABNORMAL LOW (ref 150–400)
RBC: 3.94 MIL/uL (ref 3.87–5.11)
RDW: 13.8 % (ref 11.5–15.5)
WBC: 12 10*3/uL — ABNORMAL HIGH (ref 4.0–10.5)

## 2014-04-30 LAB — GLUCOSE, CAPILLARY
GLUCOSE-CAPILLARY: 168 mg/dL — AB (ref 70–99)
GLUCOSE-CAPILLARY: 136 mg/dL — AB (ref 70–99)
GLUCOSE-CAPILLARY: 129 mg/dL — AB (ref 70–99)
Glucose-Capillary: 172 mg/dL — ABNORMAL HIGH (ref 70–99)
Glucose-Capillary: 137 mg/dL — ABNORMAL HIGH (ref 70–99)
Glucose-Capillary: 157 mg/dL — ABNORMAL HIGH (ref 70–99)

## 2014-04-30 LAB — PHOSPHORUS: Phosphorus: 2.6 mg/dL (ref 2.3–4.6)

## 2014-04-30 LAB — MAGNESIUM: MAGNESIUM: 1.7 mg/dL (ref 1.5–2.5)

## 2014-04-30 MED ORDER — FAT EMULSION 20 % IV EMUL
168.0000 mL | INTRAVENOUS | Status: AC
  Administered 2014-04-30: 168 mL via INTRAVENOUS
  Filled 2014-04-30: qty 200

## 2014-04-30 MED ORDER — SODIUM CHLORIDE 0.9 % IV BOLUS (SEPSIS)
250.0000 mL | Freq: Once | INTRAVENOUS | Status: AC
  Administered 2014-04-30: 250 mL via INTRAVENOUS

## 2014-04-30 MED ORDER — TRACE MINERALS CR-CU-F-FE-I-MN-MO-SE-ZN IV SOLN
INTRAVENOUS | Status: AC
  Administered 2014-04-30: 18:00:00 via INTRAVENOUS
  Filled 2014-04-30: qty 2000

## 2014-04-30 MED ORDER — SODIUM CHLORIDE 0.9 % IV BOLUS (SEPSIS)
1000.0000 mL | Freq: Once | INTRAVENOUS | Status: AC
  Administered 2014-04-30: 1000 mL via INTRAVENOUS

## 2014-04-30 MED ORDER — HYDRALAZINE HCL 20 MG/ML IJ SOLN
10.0000 mg | Freq: Four times a day (QID) | INTRAMUSCULAR | Status: DC | PRN
  Administered 2014-05-04: 10 mg via INTRAVENOUS
  Filled 2014-04-30: qty 1

## 2014-04-30 MED ORDER — MAGNESIUM SULFATE 40 MG/ML IJ SOLN
2.0000 g | Freq: Once | INTRAMUSCULAR | Status: AC
  Administered 2014-04-30: 2 g via INTRAVENOUS
  Filled 2014-04-30: qty 50

## 2014-04-30 MED ORDER — POTASSIUM CHLORIDE 10 MEQ/100ML IV SOLN
10.0000 meq | INTRAVENOUS | Status: AC
  Administered 2014-04-30 (×5): 10 meq via INTRAVENOUS
  Filled 2014-04-30 (×5): qty 100

## 2014-04-30 NOTE — Progress Notes (Signed)
Utilization review completed.  

## 2014-04-30 NOTE — Progress Notes (Signed)
PT Cancellation Note  Patient Details Name: Erika Day MRN: [id number] DOB: [birth year]   Cancelled Treatment:    Reason Eval/Treat Not Completed: Fatigue/lethargy limiting ability to participate.  Attempted re-eval x2. Will follow up in the am for evaluation.   Duncan Dull 04/30/2014, 4:50 PM Alben Deeds, [place] DPT  [phone]

## 2014-04-30 NOTE — Progress Notes (Signed)
Patient having low UOP, urine concentrated in appereance. Fredirick Maudlin, NP notified. New orders received. Will continue to monitor.

## 2014-04-30 NOTE — Progress Notes (Signed)
Patient examined and I agree with the assessment and plan Patient also C/O perianal pain. Unable to see while in chair. Will try to look when she is in bed. Increase IVF.  Georganna Skeans, MD, MPH, FACS Trauma: [phone] General Surgery: [phone]  04/30/2014 8:42 AM

## 2014-04-30 NOTE — Progress Notes (Addendum)
Interim discharge summary  80 year female with a history of hypertension, stroke, GERD, oophorectomy/hysterectomy for a benign pelvic mass which was causing a bowel obstruction in 2012 at [facility], cholecystectomy and DDD mostly wheelchair bound. She denies a history of diabetes mellitus. Denies other abdominal surgeries.  She presented with abdominal pain for 3 weeks. Onset was gradual. Associated with nausea and vomiting. She reports diarrhea x4 days now, non bloody. She is normally constipated. She takes miralax on daily basis. CT scan of the abdomen and pelvis showed a high-grade small bowel obstruction with transition to decompressed distal small bowel in the right mid abdomen. Patient was admitted to hospitalist service for small bowel obstruction and treated conservatively with NG tube suction, when necessary Dulcolax, surgery was consulted, serial KUB did not show improvement. After 5 days the patient failed conservative treatment. Subsequently the patient underwent.Exploratory laparotomy with extensive lysis of adhesions on 6/28  Patient hemodynamically stable During this hospitalization the patient's antihypertensive medications including verapamil and lisinopril were held. Blood pressure somewhat low post surgery however during this hospitalization has ranged mostly between 120-150. Resume verapamil and lisinopril when  able to swallow. In the meantime would recommend controlling blood pressure with hydralazine 10 mg IV every 6 when necessary for systolic blood pressure greater than 160.   Patient does not have any history of diabetes During the hospitalization the patient was found to have hypoglycemic episodes when the patient was  N.p.o.on  NG tube suction She did require infusion of D5 normal saline. However now the patient is on TPN and her CBGs have  been stable  Post surgery the patient is being transferred to [facility] surgery.  please contact triad hospitalists  for  any acute issues

## 2014-04-30 NOTE — Progress Notes (Signed)
Patient ID: Erika Day, female   DOB: 02-03-1930, 80 y.o.   MRN: [id number] 1 Day Post-Op  Subjective: Pt feels ok today, just has some pain.  No flatus yet.  Up in chair already this morning.   Objective: Vital signs in last 24 hours: Temp:  [97.3 F (36.3 C)-99.4 F (37.4 C)] 98.5 F (36.9 C) (06/29 0315) Pulse Rate:  [55-93] 77 (06/29 0315) Resp:  [17-41] 21 (06/29 0315) BP: (107-173)/(46-92) 107/55 mmHg (06/29 0315) SpO2:  [95 %-100 %] 99 % (06/29 0315) Last BM Date: 04/26/14  Intake/Output from previous day: 06/28 0701 - 06/29 0700 In: 2746.3 [I.V.:1171.7; IV Piggyback:500; TPN:1074.7] Out: 1250 [Urine:550; Emesis/NG output:500] Intake/Output this shift:    PE: Abd: soft, appropriately tender, absent bs, ND, NGT with some bilious output GU: minimal urine output Heart: regular Lungs: CTAB  Lab Results:   Recent Labs  04/29/14 0603 04/30/14 0400  WBC 8.4 12.0*  HGB 9.8* 11.0*  HCT 30.1* 34.2*  PLT 136* 146*   BMET  Recent Labs  04/29/14 0603 04/30/14 0400  NA 144 143  K 3.1* 3.4*  CL 108 108  CO2 25 23  GLUCOSE 131* 155*  BUN 9 16  CREATININE 0.85 0.96  CALCIUM 8.5 8.2*   PT/INR No results found for this basename: LABPROT, INR,  in the last 72 hours CMP     Component Value Date/Time   NA 143 04/30/2014 0400   K 3.4* 04/30/2014 0400   CL 108 04/30/2014 0400   CO2 23 04/30/2014 0400   GLUCOSE 155* 04/30/2014 0400   BUN 16 04/30/2014 0400   CREATININE 0.96 04/30/2014 0400   CALCIUM 8.2* 04/30/2014 0400   PROT 5.2* 04/30/2014 0400   ALBUMIN 2.5* 04/30/2014 0400   AST 22 04/30/2014 0400   ALT 23 04/30/2014 0400   ALKPHOS 50 04/30/2014 0400   BILITOT 0.4 04/30/2014 0400   GFRNONAA 54* 04/30/2014 0400   GFRAA 62* 04/30/2014 0400   Lipase     Component Value Date/Time   LIPASE 34 04/23/2014 1052       Studies/Results: No results found.  Anti-infectives: Anti-infectives   Start     Dose/Rate Route Frequency Ordered Stop   04/29/14 1400  cefOXitin  (MEFOXIN) 2 g in dextrose 5 % 50 mL IVPB     2 g 100 mL/hr over 30 Minutes Intravenous  Once 04/29/14 1231 04/29/14 1416       Assessment/Plan  1. POD 1, s/p ex lap with LOA for SBO 2. Decreased UOP 3. Mild hypotension 4. #2,3 likely secondary to under resuscitation 5. H/o HTN 6. DM 7. PCM/TNA 8. Deconditioning 9. hypokalemia  Plan: 1. I will give a fluid bolus today as well as increase her IVFs some.  IVFs with TNA only totaled about 70cc/hr.  hgb actually went up over night.  Will increase fluids to 50cc/hr with TNA at 60cc/hr to equal about 110cc/hr and see if this will help BP and UOP.  She has no h/o CHF or heart issues, so I think she can handle this.  Will continue to monitor in the SDU today. 2. Cont NGT and await bowel function. 3. Cont TNA until able to tolerate a diet 4. Replace K+ 4. CBGs 130-150s on TNA, stable on SSI 3. PT eval today for mobilization   LOS: 7 days    OSBORNE,KELLY E 04/30/2014, 7:39 AM Pager: [phone]

## 2014-04-30 NOTE — Progress Notes (Signed)
[place] NOTE  Pharmacy Consult for TPN Indication: unresolving SBO  Allergies  Allergen Reactions  . Morphine And Related Other (See Comments)    Blood sugar dropped     Patient Measurements: Height: 4\' 11"  (149.9 cm) (historical ) Weight: 168 lb 3.4 oz (76.301 kg) IBW/kg (Calculated) : 43.2 Usual Weight: 76 kg  Vital Signs: Temp: 98.1 F (36.7 C) (06/29 0743) Temp src: Oral (06/29 0743) BP: 107/55 mmHg (06/29 0315) Pulse Rate: 77 (06/29 0315) Intake/Output from previous day: 06/28 0701 - 06/29 0700 In: 2746.3 [I.V.:1171.7; IV Piggyback:500; TPN:1074.7] Out: 1250 [Urine:550; Emesis/NG output:500] Intake/Output from this shift: Total I/O In: -  Out: 25 [Urine:25]  Labs:  Recent Labs  04/29/14 0603 04/30/14 0400  WBC 8.4 12.0*  HGB 9.8* 11.0*  HCT 30.1* 34.2*  PLT 136* 146*     Recent Labs  04/29/14 0603 04/30/14 0400  NA 144 143  K 3.1* 3.4*  CL 108 108  CO2 25 23  GLUCOSE 131* 155*  BUN 9 16  CREATININE 0.85 0.96  CALCIUM 8.5 8.2*  MG 1.8 1.7  PHOS 2.2* 2.6  PROT 5.4* 5.2*  ALBUMIN 2.5* 2.5*  AST 31 22  ALT 23 23  ALKPHOS 53 50  BILITOT 0.3 0.4  PREALBUMIN 9.0*  --   TRIG 95  --    Estimated Creatinine Clearance: 40.2 ml/min (by C-G formula based on Cr of 0.96).    Recent Labs  04/29/14 1205 04/29/14 1930 04/29/14 2306  GLUCAP 117* 184* 164*    Insulin Requirements in the past 24 hours:  9 units SSI- CBGs 117-184  Current Nutrition:  Clinimix E 5/15 at 81mL/hr + lipid 20% emulsion at 35mL/hr- provides 72g protein daily and 1358kcal daily- 100% of goals Also has D5/NS running at 6mL/hr which provides an extra 240kcal from dextrose  Nutritional Goals: (per RD note 6/27) Kcal: 1350 - 1500  Protein: 70 - 80 g  Assessment: 82 YOF presented with several days of abdominal distension, nausea, and vomiting- with pSBO on CT now s/p ex lap with lysis of adhesions 6/28 as medical management was not improving  obstruction and small bowel was becoming more dilated.  GI: h/o SBO. Has been passing flatus and having BM during this admission (last 6/25). 6/26 abd Xray worsened; KUB with persistent SBO. Still with some pain. Has NG tubed clamped- 575mL output in last 24 hours. Initial prealbumin low at 9, trigs normal at 95.  Endo: hx DM- A1C 5.4%, CBGs ok on SSI.  Lytes: K low at 3.4- surgery ordered 5 K-runs. mag wnl, but lower than goal of 2 for SBO at 1.7. Other lytes good- CorCa 9.4, phos 2.6  Renal: Scr 0.96- stable, CrCl ~34mL/min. Surgery increased IVF in hopes to help with UOP  Pulm: 99% on 2L [place], no meds  Cards: hx HTN, HLD. BP is soft (getting fluid bolus and increased rate), not on any meds  Hepatobil: LFTs remain WNL except for albumin being low at 2.5  Neuro: h/o CVA, anxiety  ID:  Afebrile. WBC 12- likely reflection of hemoconcentration as entire CBC increased. Not on abx.  Heme/Onc: Hgb 11, likely concentration, has not received blood. Platelets slightly low, no bleeding noted  Best Practices: MC, LMWH  TPN Access: PICC 6/27 TPN day#: 2 (started 6/27)  Plan:  1. Give magnesium 2g IV x1 for repletion 2. Continue Clinimix E 5/15 at 55ml/hr with lipid 20% emulsion at 53ml/hr- provides 100% of goals with 72g protein and 1358kcal  daily 3. IV multivitamin and trace elements daily in TPN 4. IVF as per MD orders 5. Continue SSI as ordered 6. Follow NG output/tolerance of diet advancement 7. BMET + magnesium and phosphorous in the morning   Lauren D. Bajbus, PharmD, BCPS Clinical Pharmacist Pager: [phone] 04/30/2014 8:25 AM

## 2014-05-01 ENCOUNTER — Encounter (HOSPITAL_COMMUNITY): Payer: Self-pay | Admitting: Surgery

## 2014-05-01 LAB — BASIC METABOLIC PANEL
BUN: 19 mg/dL (ref 6–23)
BUN: 19 mg/dL (ref 6–23)
CO2: 21 meq/L (ref 19–32)
CO2: 22 mEq/L (ref 19–32)
Calcium: 8.1 mg/dL — ABNORMAL LOW (ref 8.4–10.5)
Calcium: 8.2 mg/dL — ABNORMAL LOW (ref 8.4–10.5)
Chloride: 108 mEq/L (ref 96–112)
Chloride: 108 mEq/L (ref 96–112)
Creatinine, Ser: 0.86 mg/dL (ref 0.50–1.10)
Creatinine, Ser: 0.84 mg/dL (ref 0.50–1.10)
GFR calc Af Amer: 71 mL/min — ABNORMAL LOW (ref >90)
GFR calc non Af Amer: 61 mL/min — ABNORMAL LOW (ref >90)
GFR, EST AFRICAN AMERICAN: 73 mL/min — AB (ref >90)
GFR, EST NON AFRICAN AMERICAN: 63 mL/min — AB (ref >90)
GLUCOSE: 150 mg/dL — AB (ref 70–99)
Glucose, Bld: 147 mg/dL — ABNORMAL HIGH (ref 70–99)
POTASSIUM: 3.7 meq/L (ref 3.7–5.3)
Potassium: 3.6 mEq/L — ABNORMAL LOW (ref 3.7–5.3)
SODIUM: 139 meq/L (ref 137–147)
Sodium: 140 mEq/L (ref 137–147)

## 2014-05-01 LAB — CBC
HEMATOCRIT: 26.9 % — AB (ref 36.0–46.0)
HEMOGLOBIN: 8.9 g/dL — AB (ref 12.0–15.0)
MCH: 28.4 pg (ref 26.0–34.0)
MCHC: 33.1 g/dL (ref 30.0–36.0)
MCV: 85.9 fL (ref 78.0–100.0)
Platelets: 114 10*3/uL — ABNORMAL LOW (ref 150–400)
RBC: 3.13 MIL/uL — AB (ref 3.87–5.11)
RDW: 13.9 % (ref 11.5–15.5)
WBC: 9.3 10*3/uL (ref 4.0–10.5)

## 2014-05-01 LAB — GLUCOSE, CAPILLARY
GLUCOSE-CAPILLARY: 134 mg/dL — AB (ref 70–99)
GLUCOSE-CAPILLARY: 137 mg/dL — AB (ref 70–99)
Glucose-Capillary: 141 mg/dL — ABNORMAL HIGH (ref 70–99)
Glucose-Capillary: 123 mg/dL — ABNORMAL HIGH (ref 70–99)
Glucose-Capillary: 138 mg/dL — ABNORMAL HIGH (ref 70–99)
Glucose-Capillary: 126 mg/dL — ABNORMAL HIGH (ref 70–99)

## 2014-05-01 LAB — MAGNESIUM: Magnesium: 1.9 mg/dL (ref 1.5–2.5)

## 2014-05-01 LAB — PHOSPHORUS: Phosphorus: 1.8 mg/dL — ABNORMAL LOW (ref 2.3–4.6)

## 2014-05-01 MED ORDER — POTASSIUM CHLORIDE 10 MEQ/50ML IV SOLN
10.0000 meq | INTRAVENOUS | Status: AC
  Administered 2014-05-01 (×2): 10 meq via INTRAVENOUS
  Filled 2014-05-01 (×2): qty 50

## 2014-05-01 MED ORDER — POTASSIUM PHOSPHATES 15 MMOLE/5ML IV SOLN
30.0000 mmol | Freq: Once | INTRAVENOUS | Status: AC
  Administered 2014-05-01: 30 mmol via INTRAVENOUS
  Filled 2014-05-01: qty 10

## 2014-05-01 MED ORDER — POTASSIUM CHLORIDE 10 MEQ/100ML IV SOLN
10.0000 meq | INTRAVENOUS | Status: DC
  Filled 2014-05-01: qty 100

## 2014-05-01 MED ORDER — TRACE MINERALS CR-CU-F-FE-I-MN-MO-SE-ZN IV SOLN
INTRAVENOUS | Status: AC
  Administered 2014-05-01: 17:00:00 via INTRAVENOUS
  Filled 2014-05-01: qty 2000

## 2014-05-01 MED ORDER — FAT EMULSION 20 % IV EMUL
168.0000 mL | INTRAVENOUS | Status: AC
  Administered 2014-05-01: 168 mL via INTRAVENOUS
  Filled 2014-05-01: qty 200

## 2014-05-01 MED ORDER — ENOXAPARIN SODIUM 40 MG/0.4ML ~~LOC~~ SOLN
40.0000 mg | SUBCUTANEOUS | Status: DC
  Administered 2014-05-01 – 2014-05-08 (×8): 40 mg via SUBCUTANEOUS
  Filled 2014-05-01 (×11): qty 0.4

## 2014-05-01 MED ORDER — MAGNESIUM SULFATE 40 MG/ML IJ SOLN: 2.0000 g | Freq: Once | INTRAMUSCULAR | Status: DC

## 2014-05-01 MED ORDER — ALBUMIN HUMAN 5 % IV SOLN
12.5000 g | Freq: Once | INTRAVENOUS | Status: AC
  Administered 2014-05-01: 12.5 g via INTRAVENOUS
  Filled 2014-05-01: qty 250

## 2014-05-01 NOTE — Evaluation (Signed)
Physical Therapy Evaluation Patient Details Name: Erika Day MRN: [id number] DOB: November 01, 1930 Today's Date: 05/01/2014   History of Present Illness  Erika Day is a 80 y.o. female with a past medical history of CVA, type 2 diabetes, hypertension, dyslipidemia, history of ovarian mass, small bowel obstruction, status post exploratory laparotomy in 2012 performed at [facility], poor functional status, presenting to the emergency department with complaints of abdominal pain, nausea and vomiting. patient now s/p ex lap with LOA for SBO on 6/28.   Clinical Impression  Patient seen for re-evaluation s/p SBO surgery, patient with deficits as indicated below. Will continue to benefit from skilled PT to address deficits and maximize function. Will see as indicated and progress as tolerated. Encouraged continued mobility with nsg.    Follow Up Recommendations Home health PT;Other (comment) (home health aide)    Equipment Recommendations  None recommended by PT    Recommendations for Other Services OT consult     Precautions / Restrictions Precautions Precautions: Fall Precaution Comments: NG tube Restrictions Weight Bearing Restrictions: No      Mobility  Bed Mobility Overal bed mobility: Needs Assistance Bed Mobility: Supine to Sit     Supine to sit: Mod assist     General bed mobility comments: needs increased time due to moves slowly, assist to come to upright  Transfers Overall transfer level: Needs assistance Equipment used: Rolling walker (2 wheeled) Transfers: Sit to/from Stand Sit to Stand: Min assist         General transfer comment: increased time to elevate to standing, assist for stability  Ambulation/Gait Ambulation/Gait assistance: Min assist Ambulation Distance (Feet): 30 Feet Assistive device: Rolling walker (2 wheeled)   Gait velocity: decreased Gait velocity interpretation: <1.8 ft/sec, indicative of risk for recurrent falls General Gait Details: VCs for  upright posture, assist for stability during ambulation, patient fatigues quickly  Stairs            Wheelchair Mobility    Modified Rankin (Stroke Patients Only)       Balance                                             Pertinent Vitals/Pain 4/10 abdominal pain, increased fatigue    Home Living Family/patient expects to be discharged to:: Private residence Living Arrangements: Spouse/significant other;Children Available Help at Discharge: Family Type of Home: House Home Access: Stairs to enter Entrance Stairs-Rails: Right;Left (daughter reports house is on a hill) Technical brewer of Steps: 4 Home Layout: One level [place]: Wheelchair - [employer] - 2 wheels Additional Comments: Pt reports she has limited household amubulation and holds onto furniture at times too    Prior [place] Hand: Right    Extremity/Trunk Assessment                         Communication   Communication: No difficulties  Cognition Arousal/Alertness: Awake/alert Behavior During Therapy: WFL for tasks assessed/performed Overall Cognitive Status: Within Functional Limits for tasks assessed                      General Comments      Exercises        Assessment/Plan    PT Assessment  Patient needs continued PT services  PT Diagnosis Difficulty walking;Generalized weakness;Abnormality of gait   PT Problem List Decreased strength;Decreased activity tolerance;Decreased mobility  PT Treatment Interventions Gait training;Functional mobility training;Therapeutic activities;Therapeutic exercise;Patient/family education   PT Goals (Current goals can be found in the Care Plan section) Acute Rehab PT Goals Patient Stated Goal: to get stronger so that she can stop using her wheelchair at home PT Goal Formulation: With patient/family Time For Goal Achievement: 05/09/14 Potential to Achieve  Goals: Good    Frequency Min 3X/week   Barriers to discharge Decreased caregiver support      Co-evaluation               End of Session Equipment Utilized During Treatment: Gait belt Activity Tolerance: Patient limited by fatigue Patient left: in bed;with call bell/phone within reach;with bed alarm set Nurse Communication: Mobility status         Time: 8889-1694 PT Time Calculation (min): 21 min   Charges:   PT Evaluation $PT Re-evaluation: 1 Procedure PT Treatments $Gait Training: 8-22 mins   PT G CodesDuncan Dull 05/01/2014, 10:30 AM Alben Deeds, [place] DPT  [phone]

## 2014-05-01 NOTE — Progress Notes (Signed)
No flatus yet. Abdomen soft and quiet. Patient examined and I agree with the assessment and plan  Georganna Skeans, MD, MPH, FACS Trauma: [phone] General Surgery: [phone]  05/01/2014 2:29 PM

## 2014-05-01 NOTE — Progress Notes (Signed)
Report called to Baxter Flattery RN who will be receiving pt to [phone].

## 2014-05-01 NOTE — Progress Notes (Addendum)
[place] NOTE  Pharmacy Consult for TPN Indication: unresolving SBO  Allergies  Allergen Reactions  . Morphine And Related Other (See Comments)    Blood sugar dropped     Patient Measurements: Height: 4\' 11"  (149.9 cm) (historical ) Weight: 168 lb 3.4 oz (76.301 kg) IBW/kg (Calculated) : 43.2 Usual Weight: 76 kg  Vital Signs: Temp: 98.4 F (36.9 C) (06/30 0402) Temp src: Oral (06/30 0402) BP: 131/57 mmHg (06/30 0402) Pulse Rate: 66 (06/30 0402) Intake/Output from previous day: 06/29 0701 - 06/30 0700 In: 2529 [I.V.:50; IV Piggyback:1550; TPN:529] Out: 225 [Urine:225] Intake/Output from this shift:    Labs:  Recent Labs  04/29/14 0603 04/30/14 0400 05/01/14 0124  WBC 8.4 12.0* 9.3  HGB 9.8* 11.0* 8.9*  HCT 30.1* 34.2* 26.9*  PLT 136* 146* 114*     Recent Labs  04/29/14 0603 04/30/14 0400 05/01/14 0124 05/01/14 0500  NA 144 143 140 139  K 3.1* 3.4* 3.6* 3.7  CL 108 108 108 108  CO2 25 23 21 22   GLUCOSE 131* 155* 150* 147*  BUN 9 16 19 19   CREATININE 0.85 0.96 0.86 0.84  CALCIUM 8.5 8.2* 8.1* 8.2*  MG 1.8 1.7  --  1.9  PHOS 2.2* 2.6  --  1.8*  PROT 5.4* 5.2*  --   --   ALBUMIN 2.5* 2.5*  --   --   AST 31 22  --   --   ALT 23 23  --   --   ALKPHOS 53 50  --   --   BILITOT 0.3 0.4  --   --   PREALBUMIN 9.0*  --   --   --   TRIG 95  --   --   --    Estimated Creatinine Clearance: 46 ml/min (by C-G formula based on Cr of 0.84).    Recent Labs  04/30/14 1931 04/30/14 2352 05/01/14 0406  GLUCAP 129* 137* 141*    Insulin Requirements in the past 24 hours:  7 units SSI- CBGs 137-157  Current Nutrition:  Clinimix E 5/15 at 66mL/hr + lipid 20% emulsion at 36mL/hr- provides 72g protein daily and 1358kcal daily- 100% of goals Also has D5/NS running at 64mL/hr which provides an extra 240kcal from dextrose  Nutritional Goals: (per RD note 6/27) Kcal: 1350 - 1500  Protein: 70 - 80 g  Assessment: 69 YOF presented with several  days of abdominal distension, nausea, and vomiting- with pSBO on CT now s/p ex lap with lysis of adhesions 6/28 as medical management was not improving obstruction and small bowel was becoming more dilated.  GI: h/o SBO. Has been passing flatus and having BM during this admission (last 6/25). 6/26 abd Xray worsened; KUB with persistent SBO. Still with some pain. Has NG tubed clamped- no output charted in last 24 hours- per surgery note with some bilious output. Initial prealbumin low at 9, trigs normal at 95.  Endo: hx DM- A1C 5.4%, CBGs ok on SSI.  Lytes: K low at 3.7- surgery repleting with 6 KCl 20mEq-runs. mag wnl, but lower than goal of 2 for SBO at 1.9 after repletion with 2g yesterday. Phos dropped to 1.8 this morning, CorCa 9.4  Renal: Scr 0.84- stable, CrCl ~45-50mL/min. Surgery increased IVF in hopes to help with UOP- she had 560mL out yesterday  Pulm: 98/RA- no meds  Cards: hx HTN, HLD. BP is soft but improving with fluid- not on any meds  Hepatobil: LFTs remain WNL except for albumin  being low at 2.5  Neuro: h/o CVA, anxiety  ID:  Afebrile. WBC 9.3, afebrile. No abx.  Heme/Onc: Hgb down to 8.9- drop likely from hydration as hgb yesterday was elevated over patient's baseline during hospitalization. Platelets low, no bleeding noted.  Best Practices: MC, LMWH  TPN Access: PICC 6/27 TPN day#: 3 (started 6/27)  Plan:  1. Change potassium repletion to potassium phosphate 66mmol IV x1- this will also provide 45mEq of potassium. Will also give 2 runs of KCl 69mEq to equal original order by surgery 2. Give magnesium 2g IV x1 for repletion since still less than goal of 2 3. Continue Clinimix E 5/15 at 65ml/hr with lipid 20% emulsion at 42ml/hr- provides 100% of goals with 72g protein and 1358kcal daily 4. IV multivitamin and trace elements daily in TPN 5. IVF as per MD orders 6. Continue SSI as ordered 7. Follow NG output/tolerance of diet advancement 8. BMET + magnesium and  phosphorous in the morning   Lauren D. Bajbus, PharmD, BCPS Clinical Pharmacist Pager: [phone] 05/01/2014 7:56 AM

## 2014-05-01 NOTE — Progress Notes (Signed)
Patient ID: Erika Day, female   DOB: Jul 19, 1930, 80 y.o.   MRN: [id number] 2 Days Post-Op  Subjective: Pt resting.  No c/o.  No flatus yet.  Buttock pain improved.  She thinks she was sitting on a wrinkle.  Objective: Vital signs in last 24 hours: Temp:  [98.1 F (36.7 C)-99.2 F (37.3 C)] 98.4 F (36.9 C) (06/30 0402) Pulse Rate:  [63-78] 66 (06/30 0402) Resp:  [20] 20 (06/29 1154) BP: (94-141)/(50-61) 131/57 mmHg (06/30 0402) SpO2:  [98 %] 98 % (06/30 0402) Last BM Date: 04/26/14  Intake/Output from previous day: 06/29 0701 - 06/30 0700 In: 2529 [I.V.:50; IV Piggyback:1550; TPN:529] Out: 225 [Urine:225] Intake/Output this shift:    PE: Abd: soft, appropriately tender, absent BS, NGT with some bilious output, incision covered and dry. HEart: regular, slight murmur Lungs: CTAB  Lab Results:   Recent Labs  04/30/14 0400 05/01/14 0124  WBC 12.0* 9.3  HGB 11.0* 8.9*  HCT 34.2* 26.9*  PLT 146* 114*   BMET  Recent Labs  05/01/14 0124 05/01/14 0500  NA 140 139  K 3.6* 3.7  CL 108 108  CO2 21 22  GLUCOSE 150* 147*  BUN 19 19  CREATININE 0.86 0.84  CALCIUM 8.1* 8.2*   PT/INR No results found for this basename: LABPROT, INR,  in the last 72 hours CMP     Component Value Date/Time   NA 139 05/01/2014 0500   K 3.7 05/01/2014 0500   CL 108 05/01/2014 0500   CO2 22 05/01/2014 0500   GLUCOSE 147* 05/01/2014 0500   BUN 19 05/01/2014 0500   CREATININE 0.84 05/01/2014 0500   CALCIUM 8.2* 05/01/2014 0500   PROT 5.2* 04/30/2014 0400   ALBUMIN 2.5* 04/30/2014 0400   AST 22 04/30/2014 0400   ALT 23 04/30/2014 0400   ALKPHOS 50 04/30/2014 0400   BILITOT 0.4 04/30/2014 0400   GFRNONAA 63* 05/01/2014 0500   GFRAA 73* 05/01/2014 0500   Lipase     Component Value Date/Time   LIPASE 34 04/23/2014 1052       Studies/Results: No results found.  Anti-infectives: Anti-infectives   Start     Dose/Rate Route Frequency Ordered Stop   04/29/14 1400  cefOXitin (MEFOXIN) 2 g  in dextrose 5 % 50 mL IVPB     2 g 100 mL/hr over 30 Minutes Intravenous  Once 04/29/14 1231 04/29/14 1416       Assessment/Plan 1. POD 2, s/p ex lap with LOA for SBO  2. Decreased UOP  3. Mild hypotension, resolved 4. #2,3 likely secondary to under resuscitation  5. H/o HTN  6. DM, stable  7. PCM/TNA  8. Deconditioning  9. hypokalemia    Plan: 1. Replace K again, only 3.7. 2. Will give a 250cc albumin bolus to help with UOP.  She may be starting to pick up as she has put out 50cc in the last 2.5hrs.  Cont foley for UOP monitoring. 3. Cont PT to try and mobilize the patient. 4. Cont NGT and await bowel function 5. tx to the floor today 6. Follow labs 7. hgb from 11 to 8.9, this is secondary to hydration yesterday.  Was around 9.8 prior to surgery. 8. Cont TNA while NPO  LOS: 8 days    OSBORNE,KELLY E 05/01/2014, 7:35 AM Pager: [phone]

## 2014-05-02 ENCOUNTER — Inpatient Hospital Stay (HOSPITAL_COMMUNITY): Payer: PRIVATE HEALTH INSURANCE

## 2014-05-02 LAB — GLUCOSE, CAPILLARY
GLUCOSE-CAPILLARY: 139 mg/dL — AB (ref 70–99)
GLUCOSE-CAPILLARY: 139 mg/dL — AB (ref 70–99)
Glucose-Capillary: 121 mg/dL — ABNORMAL HIGH (ref 70–99)
Glucose-Capillary: 125 mg/dL — ABNORMAL HIGH (ref 70–99)
Glucose-Capillary: 131 mg/dL — ABNORMAL HIGH (ref 70–99)
Glucose-Capillary: 136 mg/dL — ABNORMAL HIGH (ref 70–99)

## 2014-05-02 LAB — CBC
HCT: 24.6 % — ABNORMAL LOW (ref 36.0–46.0)
HEMOGLOBIN: 8.2 g/dL — AB (ref 12.0–15.0)
MCH: 28.6 pg (ref 26.0–34.0)
MCHC: 33.3 g/dL (ref 30.0–36.0)
MCV: 85.7 fL (ref 78.0–100.0)
Platelets: 119 10*3/uL — ABNORMAL LOW (ref 150–400)
RBC: 2.87 MIL/uL — ABNORMAL LOW (ref 3.87–5.11)
RDW: 13.8 % (ref 11.5–15.5)
WBC: 9 10*3/uL (ref 4.0–10.5)

## 2014-05-02 LAB — BASIC METABOLIC PANEL
BUN: 17 mg/dL (ref 6–23)
CALCIUM: 8.3 mg/dL — AB (ref 8.4–10.5)
CO2: 21 mEq/L (ref 19–32)
Chloride: 105 mEq/L (ref 96–112)
Creatinine, Ser: 0.78 mg/dL (ref 0.50–1.10)
GFR calc Af Amer: 88 mL/min — ABNORMAL LOW (ref >90)
GFR, EST NON AFRICAN AMERICAN: 76 mL/min — AB (ref >90)
GLUCOSE: 121 mg/dL — AB (ref 70–99)
POTASSIUM: 4 meq/L (ref 3.7–5.3)
Sodium: 136 mEq/L — ABNORMAL LOW (ref 137–147)

## 2014-05-02 LAB — PHOSPHORUS: Phosphorus: 2.2 mg/dL — ABNORMAL LOW (ref 2.3–4.6)

## 2014-05-02 LAB — MAGNESIUM: Magnesium: 1.9 mg/dL (ref 1.5–2.5)

## 2014-05-02 MED ORDER — SODIUM PHOSPHATE 3 MMOLE/ML IV SOLN
30.0000 mmol | Freq: Once | INTRAVENOUS | Status: AC
  Administered 2014-05-02: 30 mmol via INTRAVENOUS
  Filled 2014-05-02: qty 10

## 2014-05-02 MED ORDER — MAGNESIUM SULFATE 40 MG/ML IJ SOLN
2.0000 g | Freq: Once | INTRAMUSCULAR | Status: AC
  Administered 2014-05-02: 2 g via INTRAVENOUS
  Filled 2014-05-02: qty 50

## 2014-05-02 MED ORDER — TRACE MINERALS CR-CU-F-FE-I-MN-MO-SE-ZN IV SOLN
INTRAVENOUS | Status: AC
  Administered 2014-05-02: 18:00:00 via INTRAVENOUS
  Filled 2014-05-02: qty 2000

## 2014-05-02 MED ORDER — FAT EMULSION 20 % IV EMUL
168.0000 mL | INTRAVENOUS | Status: AC
  Administered 2014-05-02: 168 mL via INTRAVENOUS
  Filled 2014-05-02: qty 200

## 2014-05-02 NOTE — Progress Notes (Signed)
[place] NOTE  Pharmacy Consult for TPN Indication: unresolving SBO  Allergies  Allergen Reactions  . Morphine And Related Other (See Comments)    Blood sugar dropped     Patient Measurements: Height: 4\' 11"  (149.9 cm) (historical ) Weight: 168 lb 3.4 oz (76.301 kg) IBW/kg (Calculated) : 43.2 Usual Weight: 76 kg  Vital Signs: Temp: 97.9 F (36.6 C) (07/01 0533) Temp src: Oral (07/01 0533) BP: 130/58 mmHg (07/01 0533) Pulse Rate: 62 (07/01 0533) Intake/Output from previous day: 06/30 0701 - 07/01 0700 In: 1307 [I.V.:570; TPN:737] Out: 1050 [Urine:850; Emesis/NG output:200] Intake/Output from this shift:    Labs:  Recent Labs  04/30/14 0400 05/01/14 0124 05/02/14 0046  WBC 12.0* 9.3 9.0  HGB 11.0* 8.9* 8.2*  HCT 34.2* 26.9* 24.6*  PLT 146* 114* 119*     Recent Labs  04/30/14 0400 05/01/14 0124 05/01/14 0500 05/02/14 0046  NA 143 140 139 136*  K 3.4* 3.6* 3.7 4.0  CL 108 108 108 105  CO2 23 21 22 21   GLUCOSE 155* 150* 147* 121*  BUN 16 19 19 17   CREATININE 0.96 0.86 0.84 0.78  CALCIUM 8.2* 8.1* 8.2* 8.3*  MG 1.7  --  1.9 1.9  PHOS 2.6  --  1.8* 2.2*  PROT 5.2*  --   --   --   ALBUMIN 2.5*  --   --   --   AST 22  --   --   --   ALT 23  --   --   --   ALKPHOS 50  --   --   --   BILITOT 0.4  --   --   --    Estimated Creatinine Clearance: 48.3 ml/min (by C-G formula based on Cr of 0.78).    Recent Labs  05/01/14 2359 05/02/14 0442 05/02/14 0806  GLUCAP 121* 139* 136*    Insulin Requirements in the past 24 hours:  6 units SSI- CBGs 121-141  Current Nutrition:  Clinimix E 5/15 at 76mL/hr + lipid 20% emulsion at 53mL/hr- provides 72g protein daily and 1358kcal daily- 100% of goals Also has D5/NS running at 15mL/hr which provides an extra 240kcal from dextrose  Nutritional Goals: (per RD note 6/27) Kcal: 1350 - 1500  Protein: 70 - 80 g  Assessment: 44 YOF presented with several days of abdominal distension, nausea, and  vomiting- with pSBO on CT now s/p ex lap with lysis of adhesions 6/28 as medical management was not improving obstruction and small bowel was becoming more dilated.  GI: h/o SBO. Has been passing flatus and having BM during this admission (last 6/25). 6/26 abd Xray worsened; KUB with persistent SBO. Still with some pain. Has NG tubed clamped- 275mL output in last 24 hours. Initial prealbumin low at 9 on 6/28, trigs normal at 95.  Endo: hx DM- A1C 5.4%, CBGs ok on SSI.  Lytes: Na low at 136, K nml at 4 after repletion past 2 days, mag wnl, but lower than goal of 2 for SBO at 1.9- patient did not receive magnesium 2g IV x1 that was ordered yesterday for repletion.. Phos still low at 2.2, CorCa 9.4  Renal: Scr 0.78- stable, CrCl ~45-48mL/min. Surgery increased IVF in hopes to help with UOP- she had 856mL out yesterday  Pulm: 96/RA- no meds  Cards: hx HTN, HLD. VSS- not on any meds  Hepatobil: LFTs remain WNL except for albumin being low at 2.5  Neuro: h/o CVA, anxiety  ID:  Afebrile.  WBC 9, afebrile. No abx.  Heme/Onc: Hgb 8.2- at baseline. Platelets low, no bleeding noted.  Best Practices: MC, LMWH  TPN Access: PICC 6/27 TPN day#: 4 (started 6/27)  Plan:  1. Replete phosphorous and sodium with sodium phosphate 64mmol IV x1 2. Give magnesium 2g IV x1 for repletion since still less than goal of 2 and dose was never given yesterday as ordered 3. Continue Clinimix E 5/15 at 69ml/hr with lipid 20% emulsion at 30ml/hr- provides 100% of goals with 72g protein and 1358kcal daily 4. IV multivitamin and trace elements daily in TPN 5. IVF as per MD orders 6. Continue SSI as ordered 7. Follow NG output/tolerance of diet advancement 8. TPN labs as ordered   Malik Ruffino D. Riccardo Holeman, PharmD, BCPS Clinical Pharmacist Pager: [phone] 05/02/2014 8:59 AM

## 2014-05-02 NOTE — Progress Notes (Signed)
Physical Therapy Treatment Patient Details Name: Erika Day MRN: [id number] DOB: [birth year] Today's Date: 05/02/2014    History of Present Illness Erika Day is a 80 y.o. female with a past medical history of CVA, type 2 diabetes, hypertension, dyslipidemia, history of ovarian mass, small bowel obstruction, status post exploratory laparotomy in 2012 performed at [facility], poor functional status, presenting to the emergency department with complaints of abdominal pain, nausea and vomiting.    PT Comments    Progressing slowly, pt quick to fatigue and in general stomach feels gassy and gripe.  Gait mildly unsteady throughout.  Follow Up Recommendations  Home health PT;Other (comment)     Equipment Recommendations  None recommended by PT    Recommendations for Other Services       Precautions / Restrictions Precautions Precautions: Fall Precaution Comments: NG tube Restrictions Weight Bearing Restrictions: No    Mobility  Bed Mobility               General bed mobility comments: in chair  Transfers Overall transfer level: Needs assistance Equipment used: Rolling walker (2 wheeled) Transfers: Sit to/from Stand Sit to Stand: Min assist         General transfer comment: effortful stand for patient esp lower surface needing heavy min assist to come forward mostly.  Ambulation/Gait Ambulation/Gait assistance: Min assist Ambulation Distance (Feet): 38 Feet (before needing sitting rest, then addidtional 50 feet) Assistive device: Rolling walker (2 wheeled) Gait Pattern/deviations: Step-through pattern;Trunk flexed Gait velocity: decreased Gait velocity interpretation: Below normal speed for age/gender General Gait Details: cues for postural checks, stability assist, help maneuvering RW .  Noticeably SOB with exertion, and quick to fatigue.   Stairs            Wheelchair Mobility    Modified Rankin (Stroke Patients Only)       Balance Overall balance  assessment: Needs assistance Sitting-balance support: No upper extremity supported Sitting balance-Leahy Scale: Fair     Standing balance support: Single extremity supported;Bilateral upper extremity supported Standing balance-Leahy Scale: Poor                      Cognition Arousal/Alertness: Awake/alert Behavior During Therapy: WFL for tasks assessed/performed Overall Cognitive Status: Within Functional Limits for tasks assessed                      Exercises      General Comments        Pertinent Vitals/Pain     Home Living                      Prior Function            PT Goals (current goals can now be found in the care plan section) Acute Rehab PT Goals Patient Stated Goal: to get stronger so that she can stop using her wheelchair at home PT Goal Formulation: With patient/family Time For Goal Achievement: 05/09/14 Potential to Achieve Goals: Good Progress towards PT goals: Progressing toward goals    Frequency  Min 3X/week    PT Plan Current plan remains appropriate    Co-evaluation             End of Session   Activity Tolerance: Patient limited by fatigue Patient left: in chair;with call bell/phone within reach     Time: 1303-1326 PT Time Calculation (min): 23 min  Charges:  $Gait Training: 8-22 mins $Therapeutic Activity: 8-22 mins  G Codes:      Nile Dorning, Tessie Fass 05/02/2014, 1:35 PM 05/02/2014  Donnella Sham, [place] [phone]  (pager)

## 2014-05-02 NOTE — Progress Notes (Signed)
3 Days Post-Op  Subjective: NO FLATUS YET BUT MORE CRAMPING.    Objective: Vital signs in last 24 hours: Temp:  [97.8 F (36.6 C)-99 F (37.2 C)] 97.9 F (36.6 C) (07/01 0533) Pulse Rate:  [62-72] 62 (07/01 0533) Resp:  [18-20] 20 (07/01 0533) BP: (130-154)/(55-61) 130/58 mmHg (07/01 0533) SpO2:  [96 %-100 %] 96 % (07/01 0533) Last BM Date: 04/23/14  Intake/Output from previous day: 06/30 0701 - 07/01 0700 In: 1307 [I.V.:570; TPN:737] Out: 1050 [Urine:850; Emesis/NG output:200] Intake/Output this shift:    ABDOMEN: DRESSING INTACT.  SOFT BUT SORE. NO BS   Lab Results:   Recent Labs  05/01/14 0124 05/02/14 0046  WBC 9.3 9.0  HGB 8.9* 8.2*  HCT 26.9* 24.6*  PLT 114* 119*   BMET  Recent Labs  05/01/14 0500 05/02/14 0046  NA 139 136*  K 3.7 4.0  CL 108 105  CO2 22 21  GLUCOSE 147* 121*  BUN 19 17  CREATININE 0.84 0.78  CALCIUM 8.2* 8.3*   PT/INR No results found for this basename: LABPROT, INR,  in the last 72 hours ABG No results found for this basename: PHART, PCO2, PO2, HCO3,  in the last 72 hours  Studies/Results: No results found.  Anti-infectives: Anti-infectives   Start     Dose/Rate Route Frequency Ordered Stop   04/29/14 1400  cefOXitin (MEFOXIN) 2 g in dextrose 5 % 50 mL IVPB     2 g 100 mL/hr over 30 Minutes Intravenous  Once 04/29/14 1231 04/29/14 1416      Assessment/Plan: s/p Procedure(s): EXPLORATORY LAPAROTOMY (N/A) PRIMARY REPAIR OF VENTRAL HERNIA (N/A) EXTENSIVE LYSIS OF ADHESIONS (N/A) Cont TNA  and NGT ILEUS Follow labs Check films in am No flatus yet or BM  LOS: 9 days    Erika Malcolm A. 05/02/2014

## 2014-05-03 DIAGNOSIS — I10 Essential (primary) hypertension: Secondary | ICD-10-CM

## 2014-05-03 LAB — COMPREHENSIVE METABOLIC PANEL
ALBUMIN: 2.3 g/dL — AB (ref 3.5–5.2)
ALT: 14 U/L (ref 0–35)
AST: 16 U/L (ref 0–37)
Alkaline Phosphatase: 58 U/L (ref 39–117)
Anion gap: 10 (ref 5–15)
BILIRUBIN TOTAL: 0.3 mg/dL (ref 0.3–1.2)
BUN: 16 mg/dL (ref 6–23)
CHLORIDE: 105 meq/L (ref 96–112)
CO2: 23 mEq/L (ref 19–32)
Calcium: 8.2 mg/dL — ABNORMAL LOW (ref 8.4–10.5)
Creatinine, Ser: 0.71 mg/dL (ref 0.50–1.10)
GFR calc Af Amer: >90 mL/min (ref >90)
GFR calc non Af Amer: 78 mL/min — ABNORMAL LOW (ref >90)
Glucose, Bld: 132 mg/dL — ABNORMAL HIGH (ref 70–99)
Potassium: 3.9 mEq/L (ref 3.7–5.3)
SODIUM: 138 meq/L (ref 137–147)
Total Protein: 5.1 g/dL — ABNORMAL LOW (ref 6.0–8.3)

## 2014-05-03 LAB — PHOSPHORUS: Phosphorus: 3.1 mg/dL (ref 2.3–4.6)

## 2014-05-03 LAB — CBC
HCT: 26.7 % — ABNORMAL LOW (ref 36.0–46.0)
Hemoglobin: 9 g/dL — ABNORMAL LOW (ref 12.0–15.0)
MCH: 29.3 pg (ref 26.0–34.0)
MCHC: 33.7 g/dL (ref 30.0–36.0)
MCV: 87 fL (ref 78.0–100.0)
PLATELETS: 132 10*3/uL — AB (ref 150–400)
RBC: 3.07 MIL/uL — ABNORMAL LOW (ref 3.87–5.11)
RDW: 13.8 % (ref 11.5–15.5)
WBC: 6.6 10*3/uL (ref 4.0–10.5)

## 2014-05-03 LAB — DIFFERENTIAL
Basophils Absolute: 0 10*3/uL (ref 0.0–0.1)
Basophils Relative: 0 % (ref 0–1)
EOS PCT: 1 % (ref 0–5)
Eosinophils Absolute: 0.1 10*3/uL (ref 0.0–0.7)
LYMPHS ABS: 4.6 10*3/uL — AB (ref 0.7–4.0)
Lymphocytes Relative: 70 % — ABNORMAL HIGH (ref 12–46)
Monocytes Absolute: 0.2 10*3/uL (ref 0.1–1.0)
Monocytes Relative: 3 % (ref 3–12)
NEUTROS PCT: 26 % — AB (ref 43–77)
Neutro Abs: 1.7 10*3/uL (ref 1.7–7.7)

## 2014-05-03 LAB — GLUCOSE, CAPILLARY
GLUCOSE-CAPILLARY: 120 mg/dL — AB (ref 70–99)
GLUCOSE-CAPILLARY: 152 mg/dL — AB (ref 70–99)
GLUCOSE-CAPILLARY: 123 mg/dL — AB (ref 70–99)
Glucose-Capillary: 132 mg/dL — ABNORMAL HIGH (ref 70–99)
Glucose-Capillary: 134 mg/dL — ABNORMAL HIGH (ref 70–99)
Glucose-Capillary: 142 mg/dL — ABNORMAL HIGH (ref 70–99)

## 2014-05-03 LAB — MAGNESIUM: Magnesium: 2.1 mg/dL (ref 1.5–2.5)

## 2014-05-03 LAB — TRIGLYCERIDES: Triglycerides: 106 mg/dL (ref <150)

## 2014-05-03 LAB — PATHOLOGIST SMEAR REVIEW

## 2014-05-03 LAB — PREALBUMIN: Prealbumin: 8.1 mg/dL — ABNORMAL LOW (ref 17.0–34.0)

## 2014-05-03 MED ORDER — FAT EMULSION 20 % IV EMUL
168.0000 mL | INTRAVENOUS | Status: AC
  Administered 2014-05-03: 168 mL via INTRAVENOUS
  Filled 2014-05-03: qty 200

## 2014-05-03 MED ORDER — LORAZEPAM 2 MG/ML IJ SOLN
0.5000 mg | Freq: Once | INTRAMUSCULAR | Status: AC
  Administered 2014-05-03: 0.5 mg via INTRAVENOUS
  Filled 2014-05-03: qty 1

## 2014-05-03 MED ORDER — TRACE MINERALS CR-CU-F-FE-I-MN-MO-SE-ZN IV SOLN
INTRAVENOUS | Status: AC
  Administered 2014-05-03: 17:00:00 via INTRAVENOUS
  Filled 2014-05-03: qty 2000

## 2014-05-03 MED ORDER — PANTOPRAZOLE SODIUM 40 MG IV SOLR
40.0000 mg | Freq: Every day | INTRAVENOUS | Status: DC
  Administered 2014-05-03 – 2014-05-06 (×4): 40 mg via INTRAVENOUS
  Filled 2014-05-03 (×4): qty 40

## 2014-05-03 MED ORDER — DEXTROSE 5 % IV SOLN
20.0000 mmol | Freq: Once | INTRAVENOUS | Status: AC
  Administered 2014-05-03: 20 mmol via INTRAVENOUS
  Filled 2014-05-03: qty 6.67

## 2014-05-03 NOTE — Progress Notes (Signed)
Good BS, try clamping NGT. Patient examined and I agree with the assessment and plan  Georganna Skeans, MD, MPH, FACS Trauma: [phone] General Surgery: [phone]  05/03/2014 11:17 AM

## 2014-05-03 NOTE — Progress Notes (Signed)
NUTRITION FOLLOW UP  Intervention:   -TPN per pharmacy -Diet advancement per MD; RD to monitor for PO adequacy -Recommend providing Resource Breeze TID while on clear liquids -Will continue to monitor and supplement as warranted/tolerated  Nutrition Dx:   Inadequate oral intake related to altered GI function as evidenced by need for bowel rest and slow diet advancement-ongoing   Goal:   Intake to meet >90% of estimated nutrition needs- being met   Monitor:   TPN tolerance, GI profile, diet order, total protein/energy intake, I/O's, labs, weights  Assessment:   6/25: Surgery consulted and recommending NGT for decompression with bowel rest. NGT clamped this morning, team hoping to avoid surgical intervention. Pt reports that her belly feels "okay" and she feels weak from not having eaten anything.  Patient with approximately 9% wt loss x 1 month, per patient report. She hasn't had anything to eat since Sunday (5 days ago.) She states that when she does eat, she eats minimally. For example, she might just have two bowls of grits in the morning with cheese and that would be all she would eat in one day. She states that she drinks water and gingerale all day long, and that she also really enjoys fruit juices. Reports that her highest weight in her lifetime was around 285 lb and weighed that just a couple of years ago.   6/26: -Pt's diet advanced to clear liquid on 6/26; however this resulted in abd distension and bloating. Pt downgraded to NPO. NGT was started on suction with 300 ml output, resulting in some relief. Pt with bowel sounds. No BM in 3 days. -Received consult for TPN initiation. PICC lined to be placed 6/27 -Possible for surgery early next week if no improvement per surgery note -Per pharmacy note, plan to initiate Clinimix 5/15 at 40 ml/hr with lipids at 7 ml/hr, and to advance to goal rate of 60 ml/hr -Clinimix 5/15 at 60 ml/hr will provide 1358 kcal, and 72 gram protein (100%  est kcal and protein needs) -Discussed pt with Pharm, plan for updated CMP labs tomorrow and Monday/Thursday  Pt underwent Exploratory laparotomy with extensive lysis of adhesions on 6/28  7/2: Pt continues to receive Clinimix E 5/15 at 51m/hr with lipid 20% emulsion at 746mhr- provides 100% of goals with 72g protein and 1358 kcal daily. NGT clamped today. Pt reports some nausea and belching but, states that so far she feels okay. She is interested in eating. No recent weight on patient. RD to continue to monitor.  Labs: low albumin, low calcium, low hemoglobin, Phosphorus and Magnesium are WNL, Triglycerides WNL.    Height: Ht Readings from Last 1 Encounters:  04/30/14 4' 11"  (1.499 m)    Weight Status:   Wt Readings from Last 1 Encounters:  04/28/14 168 lb 3.4 oz (76.301 kg)    Estimated needs:  Kcal: 1350 - 1520  Protein: 70 - 80 g  Fluid: at least 1.5 liters   Skin: closed abdominal incision  Diet Order: NPO   Intake/Output Summary (Last 24 hours) at 05/03/14 1518 Last data filed at 05/03/14 0850  Gross per 24 hour  Intake   1400 ml  Output   1850 ml  Net   -450 ml    Last BM: 6/25   Labs:   Recent Labs Lab 05/01/14 0500 05/02/14 0046 05/03/14 0515  NA 139 136* 138  K 3.7 4.0 3.9  CL 108 105 105  CO2 22 21 23   BUN 19 17 16  CREATININE 0.84 0.78 0.71  CALCIUM 8.2* 8.3* 8.2*  MG 1.9 1.9 2.1  PHOS 1.8* 2.2* 3.1  GLUCOSE 147* 121* 132*    CBG (last 3)   Recent Labs  05/03/14 0435 05/03/14 0739 05/03/14 1208  GLUCAP 132* 120* 152*    Scheduled Meds: . antiseptic oral rinse  15 mL Mouth Rinse BID  . enoxaparin (LOVENOX) injection  40 mg Subcutaneous Q24H  . insulin aspart  0-9 Units Subcutaneous 6 times per day  . pantoprazole (PROTONIX) IV  40 mg Intravenous Daily  . potassium phosphate IVPB (mmol)  20 mmol Intravenous Once  . sodium chloride  10-40 mL Intracatheter Q12H    Continuous Infusions: . dextrose 5 % and 0.9% NaCl 50 mL/hr at  05/02/14 0903  . Marland KitchenTPN (CLINIMIX-E) Adult 60 mL/hr at 05/02/14 1743   And  . fat emulsion 168 mL (05/02/14 1743)  . Marland KitchenTPN (CLINIMIX-E) Adult     And  . fat emulsion      Pryor Ochoa RD, LDN Inpatient Clinical Dietitian Pager: [phone] After Hours Pager: [phone]

## 2014-05-03 NOTE — Progress Notes (Signed)
[place] NOTE  Pharmacy Consult for TPN Indication: unresolving SBO  Allergies  Allergen Reactions  . Morphine And Related Other (See Comments)    Blood sugar dropped     Patient Measurements: Height: 4\' 11"  (149.9 cm) (historical ) Weight: 168 lb 3.4 oz (76.301 kg) IBW/kg (Calculated) : 43.2 Usual Weight: 76 kg  Vital Signs: Temp: 98.5 F (36.9 C) (07/02 0547) Temp src: Oral (07/02 0547) BP: 135/53 mmHg (07/02 0547) Pulse Rate: 65 (07/02 0547) Intake/Output from previous day: 07/01 0701 - 07/02 0700 In: 1400 [I.V.:1400] Out: 2075 [Urine:1925; Emesis/NG output:150] Intake/Output from this shift:    Labs:  Recent Labs  05/01/14 0124 05/02/14 0046  WBC 9.3 9.0  HGB 8.9* 8.2*  HCT 26.9* 24.6*  PLT 114* 119*     Recent Labs  05/01/14 0124 05/01/14 0500 05/02/14 0046 05/03/14 0515  NA 140 139 136*  --   K 3.6* 3.7 4.0  --   CL 108 108 105  --   CO2 21 22 21   --   GLUCOSE 150* 147* 121*  --   BUN 19 19 17   --   CREATININE 0.86 0.84 0.78  --   CALCIUM 8.1* 8.2* 8.3*  --   MG  --  1.9 1.9  --   PHOS  --  1.8* 2.2*  --   TRIG  --   --   --  106   Estimated Creatinine Clearance: 48.3 ml/min (by C-G formula based on Cr of 0.78).    Recent Labs  05/02/14 1951 05/02/14 2359 05/03/14 0435  GLUCAP 125* 142* 132*    Insulin Requirements in the past 24 hours:  6 units SSI- CBGs 120-142  Current Nutrition:  Clinimix E 5/15 at 37mL/hr + lipid 20% emulsion at 87mL/hr- provides 72g protein daily and 1358kcal daily- 100% of goals Also has D5/NS running at 75mL/hr which provides an extra 240kcal from dextrose  Nutritional Goals: (per RD note 6/27) Kcal: 1350 - 1500  Protein: 70 - 80 g  Assessment: 30 YOF presented with several days of abdominal distension, nausea, and vomiting- with pSBO on CT now s/p ex lap with lysis of adhesions 6/28 as medical management was not improving obstruction and small bowel was becoming more dilated.  GI:  h/o SBO. Has been passing flatus and having BM during this admission (last 6/25). 6/26 abd Xray worsened; KUB with persistent SBO. Still with some pain. Has NG tubed clamped- 143mL output in last 24 hours. Initial prealbumin low at 9 on 6/28, trigs remain normal at 106  Endo: hx DM- A1C 5.4%, CBGs ok on SSI.  Lytes: Na low at 138, K nml at 3.9, mag 2.1- now at goal. Phos wnl at 3.1, CorCa 9.6  Renal: Scr 0.71- stable, CrCl ~45-53mL/min. Surgery increased IVF in hopes to help with UOP- she had 1564mL out yesterday- improved  Pulm: 96/RA- no meds  Cards: hx HTN, HLD. BP up a bit this morning, HR nml- not on any meds  Hepatobil: LFTs remain WNL except for albumin being low at 2.3  Neuro: h/o CVA, anxiety  ID:  Afebrile. WBC 6.6, afebrile. No abx.  Heme/Onc: Hgb 9- at baseline. Platelets low, but increasing-no bleeding noted.  Best Practices: MC, LMWH  TPN Access: PICC 6/27 TPN day#: 5 (started 6/27)  Plan:  1. Give potassium phosphate 29mmol to keep K and Phos closer to goal of 4 in SBO 2. Continue Clinimix E 5/15 at 8ml/hr with lipid 20% emulsion at 2ml/hr-  provides 100% of goals with 72g protein and 1358kcal daily 3. IV multivitamin and trace elements daily in TPN 4. IVF as per MD orders 5. Continue SSI as ordered 6. Follow NG output/tolerance of diet advancement 7. BMET, mag and phos in the morning   Kaevion Sinclair D. Jantz Main, PharmD, BCPS Clinical Pharmacist Pager: [phone] 05/03/2014 7:41 AM

## 2014-05-03 NOTE — Progress Notes (Signed)
Patient ID: Erika Day, female   DOB: Sep 19, 1930, 80 y.o.   MRN: [id number]  Subjective: 157m out of NGT yesterday. No n/v.  C/o abd pain, cramping and indigestion.  No flatus yet.  Walked with PT.  Adequate UOP. VSS.    Objective:  Vital signs:  Filed Vitals:   05/02/14 0533 05/02/14 1327 05/02/14 2146 05/03/14 0547  BP: 130/58 155/75 143/64 135/53  Pulse: 62 81 81 65  Temp: 97.9 F (36.6 C) 98.9 F (37.2 C) 98.8 F (37.1 C) 98.5 F (36.9 C)  TempSrc: Oral Oral Oral Oral  Resp: 20 18 20 20   Height:      Weight:      SpO2: 96% 97% 98% 96%    Last BM Date: 04/23/14  Intake/Output   Yesterday:  07/01 0701 - 07/02 0700 In: 1400 [I.V.:1400] Out: 2075 [Urine:1925; Emesis/NG output:150] This shift:    I/O last 3 completed shifts: In: 2336 [I.V.:1800] Out: 2775 [Urine:2525; Emesis/NG output:250]     Physical Exam: General: Pt awake/alert/oriented x4 in no acute distress Chest: CTA.  No chest wall pain w good excursion CV:  Pulses intact.  Regular rhythm MS: Normal AROM mjr joints.  No obvious deformity Abdomen: Soft.  Nondistended.  Appropriately tender.  Midline incision-staples in place, no erythema or drainage.  No evidence of peritonitis.  No incarcerated hernias. Ext:  SCDs BLE.  No mjr edema.  No cyanosis Skin: No petechiae / purpura   Problem List:   Principal Problem:   SBO (small bowel obstruction) Active Problems:   HTN (hypertension), benign   DM (diabetes mellitus), type 2, uncontrolled   Abdominal pain, unspecified site    Results:   Labs: Results for orders placed during the hospital encounter of 04/23/14 (from the past 48 hour(s))  GLUCOSE, CAPILLARY     Status: Abnormal   Collection Time    05/01/14 12:37 PM      Result Value Ref Range   Glucose-Capillary 138 (*) 70 - 99 mg/dL  GLUCOSE, CAPILLARY     Status: Abnormal   Collection Time    05/01/14  4:19 PM      Result Value Ref Range   Glucose-Capillary 134 (*) 70 - 99 mg/dL   GLUCOSE, CAPILLARY     Status: Abnormal   Collection Time    05/01/14  8:22 PM      Result Value Ref Range   Glucose-Capillary 126 (*) 70 - 99 mg/dL   Comment 1 Notify RN    GLUCOSE, CAPILLARY     Status: Abnormal   Collection Time    05/01/14 11:59 PM      Result Value Ref Range   Glucose-Capillary 121 (*) 70 - 99 mg/dL   Comment 1 Notify RN    CBC     Status: Abnormal   Collection Time    05/02/14 12:46 AM      Result Value Ref Range   WBC 9.0  4.0 - 10.5 K/uL   RBC 2.87 (*) 3.87 - 5.11 MIL/uL   Hemoglobin 8.2 (*) 12.0 - 15.0 g/dL   HCT 24.6 (*) 36.0 - 46.0 %   MCV 85.7  78.0 - 100.0 fL   MCH 28.6  26.0 - 34.0 pg   MCHC 33.3  30.0 - 36.0 g/dL   RDW 13.8  11.5 - 15.5 %   Platelets 119 (*) 150 - 400 K/uL   Comment: CONSISTENT WITH PREVIOUS RESULT  BASIC METABOLIC PANEL     Status: Abnormal  Collection Time    05/02/14 12:46 AM      Result Value Ref Range   Sodium 136 (*) 137 - 147 mEq/L   Potassium 4.0  3.7 - 5.3 mEq/L   Chloride 105  96 - 112 mEq/L   CO2 21  19 - 32 mEq/L   Glucose, Bld 121 (*) 70 - 99 mg/dL   BUN 17  6 - 23 mg/dL   Creatinine, Ser 0.78  0.50 - 1.10 mg/dL   Calcium 8.3 (*) 8.4 - 10.5 mg/dL   GFR calc non Af Amer 76 (*) >90 mL/min   GFR calc Af Amer 88 (*) >90 mL/min   Comment: (NOTE)     The eGFR has been calculated using the CKD EPI equation.     This calculation has not been validated in all clinical situations.     eGFR's persistently <90 mL/min signify possible Chronic Kidney     Disease.  PHOSPHORUS     Status: Abnormal   Collection Time    05/02/14 12:46 AM      Result Value Ref Range   Phosphorus 2.2 (*) 2.3 - 4.6 mg/dL  MAGNESIUM     Status: None   Collection Time    05/02/14 12:46 AM      Result Value Ref Range   Magnesium 1.9  1.5 - 2.5 mg/dL  GLUCOSE, CAPILLARY     Status: Abnormal   Collection Time    05/02/14  4:42 AM      Result Value Ref Range   Glucose-Capillary 139 (*) 70 - 99 mg/dL  GLUCOSE, CAPILLARY     Status:  Abnormal   Collection Time    05/02/14  8:06 AM      Result Value Ref Range   Glucose-Capillary 136 (*) 70 - 99 mg/dL  GLUCOSE, CAPILLARY     Status: Abnormal   Collection Time    05/02/14 11:49 AM      Result Value Ref Range   Glucose-Capillary 131 (*) 70 - 99 mg/dL  GLUCOSE, CAPILLARY     Status: Abnormal   Collection Time    05/02/14  5:16 PM      Result Value Ref Range   Glucose-Capillary 139 (*) 70 - 99 mg/dL  GLUCOSE, CAPILLARY     Status: Abnormal   Collection Time    05/02/14  7:51 PM      Result Value Ref Range   Glucose-Capillary 125 (*) 70 - 99 mg/dL   Comment 1 Notify RN    GLUCOSE, CAPILLARY     Status: Abnormal   Collection Time    05/02/14 11:59 PM      Result Value Ref Range   Glucose-Capillary 142 (*) 70 - 99 mg/dL   Comment 1 Notify RN    GLUCOSE, CAPILLARY     Status: Abnormal   Collection Time    05/03/14  4:35 AM      Result Value Ref Range   Glucose-Capillary 132 (*) 70 - 99 mg/dL   Comment 1 Notify RN    TRIGLYCERIDES     Status: None   Collection Time    05/03/14  5:15 AM      Result Value Ref Range   Triglycerides 106  <150 mg/dL  DIFFERENTIAL     Status: None   Collection Time    05/03/14  5:15 AM      Result Value Ref Range   Neutrophils Relative % PENDING  43 - 77 %   Neutro Abs  PENDING  1.7 - 7.7 K/uL   Band Neutrophils PENDING  0 - 10 %   Lymphocytes Relative PENDING  12 - 46 %   Lymphs Abs PENDING  0.7 - 4.0 K/uL   Monocytes Relative PENDING  3 - 12 %   Monocytes Absolute PENDING  0.1 - 1.0 K/uL   Eosinophils Relative PENDING  0 - 5 %   Eosinophils Absolute PENDING  0.0 - 0.7 K/uL   Basophils Relative PENDING  0 - 1 %   Basophils Absolute PENDING  0.0 - 0.1 K/uL   WBC Morphology PENDING     RBC Morphology PENDING     Smear Review PENDING     nRBC PENDING  0 /100 WBC   Metamyelocytes Relative PENDING     Myelocytes PENDING     Promyelocytes Absolute PENDING     Blasts PENDING    COMPREHENSIVE METABOLIC PANEL     Status:  Abnormal   Collection Time    05/03/14  5:15 AM      Result Value Ref Range   Sodium 138  137 - 147 mEq/L   Potassium 3.9  3.7 - 5.3 mEq/L   Chloride 105  96 - 112 mEq/L   CO2 23  19 - 32 mEq/L   Glucose, Bld 132 (*) 70 - 99 mg/dL   BUN 16  6 - 23 mg/dL   Creatinine, Ser 0.71  0.50 - 1.10 mg/dL   Calcium 8.2 (*) 8.4 - 10.5 mg/dL   Total Protein 5.1 (*) 6.0 - 8.3 g/dL   Albumin 2.3 (*) 3.5 - 5.2 g/dL   AST 16  0 - 37 U/L   ALT 14  0 - 35 U/L   Alkaline Phosphatase 58  39 - 117 U/L   Total Bilirubin 0.3  0.3 - 1.2 mg/dL   GFR calc non Af Amer 78 (*) >90 mL/min   GFR calc Af Amer >90  >90 mL/min   Comment: (NOTE)     The eGFR has been calculated using the CKD EPI equation.     This calculation has not been validated in all clinical situations.     eGFR's persistently <90 mL/min signify possible Chronic Kidney     Disease.   Anion gap 10  5 - 15  MAGNESIUM     Status: None   Collection Time    05/03/14  5:15 AM      Result Value Ref Range   Magnesium 2.1  1.5 - 2.5 mg/dL  PHOSPHORUS     Status: None   Collection Time    05/03/14  5:15 AM      Result Value Ref Range   Phosphorus 3.1  2.3 - 4.6 mg/dL  CBC     Status: Abnormal   Collection Time    05/03/14  5:15 AM      Result Value Ref Range   WBC 6.6  4.0 - 10.5 K/uL   RBC 3.07 (*) 3.87 - 5.11 MIL/uL   Hemoglobin 9.0 (*) 12.0 - 15.0 g/dL   HCT 26.7 (*) 36.0 - 46.0 %   MCV 87.0  78.0 - 100.0 fL   MCH 29.3  26.0 - 34.0 pg   MCHC 33.7  30.0 - 36.0 g/dL   RDW 13.8  11.5 - 15.5 %   Platelets 132 (*) 150 - 400 K/uL  GLUCOSE, CAPILLARY     Status: Abnormal   Collection Time    05/03/14  7:39 AM  Result Value Ref Range   Glucose-Capillary 120 (*) 70 - 99 mg/dL    Imaging / Studies: Dg Abd 1 View  05/02/2014   CLINICAL DATA:  Ileus  EXAM: ABDOMEN - 1 VIEW  COMPARISON:  04/28/2014  FINDINGS: NG tube in the stomach. Interval improvement in small bowel dilatation. Mildly distended loops of jejunum are noted in the left  upper quadrant. There is gas and distended colon suggesting ileus. No upright or decubitus view was obtained to evaluate for free air.  Laparotomy staples are noted. Gallbladder staples are noted. Lumbar spondylosis noted.  IMPRESSION: Colonic ileus.  Resolving small bowel obstruction.   Electronically Signed   By: Franchot Gallo M.D.   On: 05/02/2014 10:31    Scheduled Meds: . antiseptic oral rinse  15 mL Mouth Rinse BID  . enoxaparin (LOVENOX) injection  40 mg Subcutaneous Q24H  . insulin aspart  0-9 Units Subcutaneous 6 times per day  . pantoprazole (PROTONIX) IV  40 mg Intravenous Daily  . potassium phosphate IVPB (mmol)  20 mmol Intravenous Once  . sodium chloride  10-40 mL Intracatheter Q12H   Continuous Infusions: . dextrose 5 % and 0.9% NaCl 50 mL/hr at 05/02/14 0903  . Marland KitchenTPN (CLINIMIX-E) Adult 60 mL/hr at 05/02/14 1743   And  . fat emulsion 168 mL (05/02/14 1743)  . Marland KitchenTPN (CLINIMIX-E) Adult     And  . fat emulsion     PRN Meds:.acetaminophen, acetaminophen, fentaNYL, hydrALAZINE, ondansetron (ZOFRAN) IV, sodium chloride, sodium chloride   Antibiotics: Anti-infectives   Start     Dose/Rate Route Frequency Ordered Stop   04/29/14 1400  cefOXitin (MEFOXIN) 2 g in dextrose 5 % 50 mL IVPB     2 g 100 mL/hr over 30 Minutes Intravenous  Once 04/29/14 1231 04/29/14 1416      Assessment/Plan  POD 4, s/p ex lap with LOA for SBO PCM/TNA Post operative ileus -start clamping trial -IS -pain control -add protonix -mobilize -SCDs/lovenox Decreased UOP -resolved.  DC foley Hypertension C/w home meds Diabetes mellitus -CBGs are stable, SSI Deconditioning -PT/OT, up to chair TID, ambulate TID    Erby Pian, [facility] Surgery Pager [phone] Office [phone]  05/03/2014 8:35 AM

## 2014-05-03 NOTE — Progress Notes (Signed)
I was called to the room by patients daughter [name]. Daughter told me that the patient had received  calls from her husband and other family members regarding her belongings at home that they are trying to get rid of.  Patient appears upset, teary eyed and requesting something to calm her down. Dr. Redmond Pulling paged, orders given and carried out. Emotional support given. Social work consulted. Will continue to monitor.

## 2014-05-04 LAB — GLUCOSE, CAPILLARY
GLUCOSE-CAPILLARY: 185 mg/dL — AB (ref 70–99)
Glucose-Capillary: 116 mg/dL — ABNORMAL HIGH (ref 70–99)
Glucose-Capillary: 116 mg/dL — ABNORMAL HIGH (ref 70–99)
Glucose-Capillary: 119 mg/dL — ABNORMAL HIGH (ref 70–99)
Glucose-Capillary: 127 mg/dL — ABNORMAL HIGH (ref 70–99)
Glucose-Capillary: 172 mg/dL — ABNORMAL HIGH (ref 70–99)

## 2014-05-04 LAB — BASIC METABOLIC PANEL
ANION GAP: 9 (ref 5–15)
BUN: 14 mg/dL (ref 6–23)
CALCIUM: 8.4 mg/dL (ref 8.4–10.5)
CO2: 24 mEq/L (ref 19–32)
Chloride: 106 mEq/L (ref 96–112)
Creatinine, Ser: 0.74 mg/dL (ref 0.50–1.10)
GFR calc non Af Amer: 77 mL/min — ABNORMAL LOW (ref >90)
GFR, EST AFRICAN AMERICAN: 89 mL/min — AB (ref >90)
Glucose, Bld: 114 mg/dL — ABNORMAL HIGH (ref 70–99)
Potassium: 3.9 mEq/L (ref 3.7–5.3)
SODIUM: 139 meq/L (ref 137–147)

## 2014-05-04 LAB — MAGNESIUM: MAGNESIUM: 1.9 mg/dL (ref 1.5–2.5)

## 2014-05-04 LAB — PHOSPHORUS: Phosphorus: 2.8 mg/dL (ref 2.3–4.6)

## 2014-05-04 MED ORDER — MAGNESIUM SULFATE IN D5W 10-5 MG/ML-% IV SOLN
1.0000 g | Freq: Once | INTRAVENOUS | Status: AC
  Administered 2014-05-04: 1 g via INTRAVENOUS
  Filled 2014-05-04: qty 100

## 2014-05-04 MED ORDER — FAT EMULSION 20 % IV EMUL
250.0000 mL | INTRAVENOUS | Status: AC
  Administered 2014-05-04: 250 mL via INTRAVENOUS
  Filled 2014-05-04: qty 250

## 2014-05-04 MED ORDER — TRACE MINERALS CR-CU-F-FE-I-MN-MO-SE-ZN IV SOLN
INTRAVENOUS | Status: AC
  Administered 2014-05-04: 18:00:00 via INTRAVENOUS
  Filled 2014-05-04: qty 2000

## 2014-05-04 MED ORDER — POTASSIUM CHLORIDE 10 MEQ/50ML IV SOLN
10.0000 meq | INTRAVENOUS | Status: AC
  Administered 2014-05-04 (×2): 10 meq via INTRAVENOUS
  Filled 2014-05-04 (×2): qty 50
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
## 2014-05-04 NOTE — Progress Notes (Signed)
[place] NOTE  Pharmacy Consult for TPN Indication: unresolving SBO  Allergies  Allergen Reactions  . Morphine And Related Other (See Comments)    Blood sugar dropped     Patient Measurements: Height: 4\' 11"  (149.9 cm) (historical ) Weight: 168 lb 3.4 oz (76.301 kg) IBW/kg (Calculated) : 43.2 Usual Weight: 76 kg  Vital Signs: Temp: 98.8 F (37.1 C) (07/03 0416) Temp src: Oral (07/03 0416) BP: 137/64 mmHg (07/03 0416) Pulse Rate: 70 (07/03 0416) Intake/Output from previous day: 07/02 0701 - 07/03 0700 In: 1769.8 [I.V.:965.8; TPN:804] Out: 600 [Urine:600] Intake/Output from this shift:    Labs:  Recent Labs  05/02/14 0046 05/03/14 0515  WBC 9.0 6.6  HGB 8.2* 9.0*  HCT 24.6* 26.7*  PLT 119* 132*     Recent Labs  05/02/14 0046 05/03/14 0515 05/04/14 0455  NA 136* 138 139  K 4.0 3.9 3.9  CL 105 105 106  CO2 21 23 24   GLUCOSE 121* 132* 114*  BUN 17 16 14   CREATININE 0.78 0.71 0.74  CALCIUM 8.3* 8.2* 8.4  MG 1.9 2.1 1.9  PHOS 2.2* 3.1 2.8  PROT  --  5.1*  --   ALBUMIN  --  2.3*  --   AST  --  16  --   ALT  --  14  --   ALKPHOS  --  58  --   BILITOT  --  0.3  --   PREALBUMIN  --  8.1*  --   TRIG  --  106  --    Estimated Creatinine Clearance: 48.3 ml/min (by C-G formula based on Cr of 0.74).    Recent Labs  05/03/14 2027 05/04/14 0023 05/04/14 0427  GLUCAP 134* 127* 116*    Insulin Requirements in the past 12 hours:  2 units SSI- CBGs 116-134  Current Nutrition:  - NPO except for ice chips - Clinimix E 5/15 at 32mL/hr + lipid 20% emulsion at 56mL/hr- provides 72g protein daily and 1358kcal daily- 100% of goals - Also has D5/NS running at 36mL/hr (per MD to increase UOP) which provides an extra 240kcal from dextrose  Nutritional Goals: (per RD note 7/2) Kcal: 1350 - 1520  Protein: 70 - 80 g  Assessment: 4 YOF presented with several days of abdominal distension, nausea, and vomiting- with pSBO on CT now s/p ex lap  with lysis of adhesions 6/28 as medical management was not improving obstruction and small bowel was becoming more dilated.  GI: Hx SBO and with a recurrent SBO this admit. S/p ex-lap with lysis of adhesions with repair of periumbilical ventral hernia on 6/28. Still with some abd pain, cramping, and indigestion. Pt has good BS - no flatus or BM yet. X-ray on 7/1 showed resolving SBO. NG tubed clamped on 7/2 - no output charted in the past 24 ours - pulled out by the pt on 7/3 AM. Pt with good BS per surgery. Prealbumin 8.1 (7/2) << 9 (6/28), trigs remain normal at 106. To attempt clears today (7/3).On PPI IV Endo: hx DM- A1C 5.4%, CBGs 116-152 on SSI Lytes: K 3.9, Mg 1.9, Phos 2.8, CorCa 9.6 - s/p KPhos 20 mmol on 7/2. Will give 2 runs of KCl and Mg 1g IV x 1 today to get K>/=4 and Mg >/= 2 in the setting of SBO Renal: Scr 0.74- stable, CrCl ~45-32mL/min. Surgery increased IVF in hopes to help with UOP- charting inaccurate on 7/2 so unclear if this has improved or not.  Pulm:  100% on RA- no meds Cards: Hx HTN/DL. BP wnl-elevated, HR wnl. On no CV meds meds Hepatobil: LFTs remain WNL except for albumin being low at 2.3 Neuro: Hx CVA/anxiety ID:  Afebrile, WBC wnl, on no abx Heme/Onc: Hgb 9- at baseline. Platelets low, but increasing-no bleeding noted. Best Practices: MC, LMWH TPN Access: PICC 6/27 TPN day#: 6 (started 6/27)  Plan:  1. Continue Clinimix E 5/15 at 44ml/hr with lipid 20% emulsion at 55ml/hr- provides 100% of goals with 72g protein and 1358kcal daily 2. IV multivitamin and trace elements daily in TPN 3. KCl 10 mEq IV x 2 runs today 4. Mg 1g IV x 1 dose today 5. IVF as per MD orders 6. Continue SSI as ordered 7. Follow NG output/tolerance of diet advancement 8. BMET + Mg in the AM  Alycia Rossetti, PharmD, BCPS Clinical Pharmacist Pager: [phone] 05/04/2014 8:36 AM

## 2014-05-04 NOTE — Progress Notes (Signed)
Patient ID: Erika Day, female   DOB: [birth year], 80 y.o.   MRN: [id number]  General Surgery - [facility] - Progress Note  POD# 5  Subjective: Patient up in chair, family at bedside.  Pulled out NG tube during the night.  Denies nausea, abdominal pain.  Objective: Vital signs in last 24 hours: Temp:  [98.4 F (36.9 C)-98.8 F (37.1 C)] 98.8 F (37.1 C) (07/03 0416) Pulse Rate:  [70-96] 70 (07/03 0416) Resp:  [17-18] 18 (07/03 0416) BP: (136-162)/(63-69) 137/64 mmHg (07/03 0416) SpO2:  [96 %-100 %] 100 % (07/03 0416) Last BM Date: 04/23/14  Intake/Output from previous day: 07/02 0701 - 07/03 0700 In: 1769.8 [I.V.:965.8; TPN:804] Out: 600 [Urine:600]  Exam: HEENT - clear, not icteric Neck - soft Chest - clear bilaterally Cor - RRR, no murmur Abd - soft with mild distension; no BS present; dressing dry and intact Ext - no significant edema Neuro - grossly intact, no focal deficits  Lab Results:   Recent Labs  05/02/14 0046 05/03/14 0515  WBC 9.0 6.6  HGB 8.2* 9.0*  HCT 24.6* 26.7*  PLT 119* 132*     Recent Labs  05/03/14 0515 05/04/14 0455  NA 138 139  K 3.9 3.9  CL 105 106  CO2 23 24  GLUCOSE 132* 114*  BUN 16 14  CREATININE 0.71 0.74  CALCIUM 8.2* 8.4    Studies/Results: Dg Abd 1 View  05/02/2014   CLINICAL DATA:  Ileus  EXAM: ABDOMEN - 1 VIEW  COMPARISON:  04/28/2014  FINDINGS: NG tube in the stomach. Interval improvement in small bowel dilatation. Mildly distended loops of jejunum are noted in the left upper quadrant. There is gas and distended colon suggesting ileus. No upright or decubitus view was obtained to evaluate for free air.  Laparotomy staples are noted. Gallbladder staples are noted. Lumbar spondylosis noted.  IMPRESSION: Colonic ileus.  Resolving small bowel obstruction.   Electronically Signed   By: Franchot Gallo M.D.   On: 05/02/2014 10:31    Assessment / Plan: 1.  Status post ex lap with LOA for SBO  NG  out  Allow sips clear liquids today  Continue TNA  Encourage OOB, ambulation  Discussed with daughter by telephone in the room  Earnstine Regal, MD, [facility] Office: [phone]  05/04/2014

## 2014-05-04 NOTE — Progress Notes (Signed)
Physical Therapy Treatment Patient Details Name: Erika Day MRN: [id number] DOB: 24-Sep-1930 Today's Date: 05/04/2014    History of Present Illness Erika Day is a 80 y.o. female with a past medical history of CVA, type 2 diabetes, hypertension, dyslipidemia, history of ovarian mass, small bowel obstruction, status post exploratory laparotomy in 2012 performed at [facility], poor functional status, presenting to the emergency department with complaints of abdominal pain, nausea and vomiting.    PT Comments    Patient limited by fatigue this session. Patient had sat up for about 3 hours prior to getting up with therapy. Family present and very helpful. Continue with current POC  Follow Up Recommendations  Home health PT;Other (comment)     Equipment Recommendations  None recommended by PT    Recommendations for Other Services       Precautions / Restrictions Precautions Precautions: Fall Precaution Comments: NG tube    Mobility  Bed Mobility   Bed Mobility: Sit to Supine       Sit to supine: Mod assist   General bed mobility comments: A for LEs back into bed and positioning once into bed  Transfers Overall transfer level: Needs assistance Equipment used: Rolling walker (2 wheeled)   Sit to Stand: Min assist         General transfer comment: A to power up into full stand and cues for hand placement. Patient with small LOB with each stand needing A to maintain balance  Ambulation/Gait Ambulation/Gait assistance: Min assist Ambulation Distance (Feet): 30 Feet Assistive device: Rolling walker (2 wheeled) Gait Pattern/deviations: Step-to pattern;Decreased step length - right;Decreased step length - left;Trunk flexed Gait velocity: decreased   General Gait Details: cues for postural checks, stability assist, help maneuvering RW .    Stairs            Wheelchair Mobility    Modified Rankin (Stroke Patients Only)       Balance                                     Cognition Arousal/Alertness: Awake/alert Behavior During Therapy: WFL for tasks assessed/performed Overall Cognitive Status: Within Functional Limits for tasks assessed                      Exercises      General Comments        Pertinent Vitals/Pain no apparent distress     Home Living                      Prior Function            PT Goals (current goals can now be found in the care plan section) Progress towards PT goals: Progressing toward goals    Frequency  Min 3X/week    PT Plan Current plan remains appropriate    Co-evaluation             End of Session Equipment Utilized During Treatment: Gait belt Activity Tolerance: Patient limited by fatigue Patient left: in bed;with call bell/phone within reach     Time: 1130-1148 PT Time Calculation (min): 18 min  Charges:  $Gait Training: 8-22 mins                    G Codes:      Jacqualyn Posey 05/04/2014, 1:35 PM 05/04/2014 Jacqualyn Posey PTA [phone]  pager [phone] office

## 2014-05-04 NOTE — Progress Notes (Signed)
Patient acidentaly pulled out NGTube. NGT has been clamped since this morning. Patient has no complaints of nausea or vomiting. Will continue to monitor. KYoung RN

## 2014-05-05 LAB — GLUCOSE, CAPILLARY
GLUCOSE-CAPILLARY: 109 mg/dL — AB (ref 70–99)
GLUCOSE-CAPILLARY: 118 mg/dL — AB (ref 70–99)
Glucose-Capillary: 123 mg/dL — ABNORMAL HIGH (ref 70–99)
Glucose-Capillary: 111 mg/dL — ABNORMAL HIGH (ref 70–99)
Glucose-Capillary: 178 mg/dL — ABNORMAL HIGH (ref 70–99)
Glucose-Capillary: 130 mg/dL — ABNORMAL HIGH (ref 70–99)

## 2014-05-05 LAB — BASIC METABOLIC PANEL
ANION GAP: 11 (ref 5–15)
BUN: 13 mg/dL (ref 6–23)
CALCIUM: 8.8 mg/dL (ref 8.4–10.5)
CHLORIDE: 105 meq/L (ref 96–112)
CO2: 23 mEq/L (ref 19–32)
CREATININE: 0.77 mg/dL (ref 0.50–1.10)
GFR calc Af Amer: 88 mL/min — ABNORMAL LOW (ref >90)
GFR calc non Af Amer: 76 mL/min — ABNORMAL LOW (ref >90)
Glucose, Bld: 106 mg/dL — ABNORMAL HIGH (ref 70–99)
Potassium: 4.2 mEq/L (ref 3.7–5.3)
Sodium: 139 mEq/L (ref 137–147)

## 2014-05-05 LAB — MAGNESIUM: Magnesium: 1.9 mg/dL (ref 1.5–2.5)

## 2014-05-05 MED ORDER — FAT EMULSION 20 % IV EMUL
250.0000 mL | INTRAVENOUS | Status: AC
  Administered 2014-05-05: 250 mL via INTRAVENOUS
  Filled 2014-05-05: qty 250

## 2014-05-05 MED ORDER — TRACE MINERALS CR-CU-F-FE-I-MN-MO-SE-ZN IV SOLN
INTRAVENOUS | Status: AC
  Administered 2014-05-05: 17:00:00 via INTRAVENOUS
  Filled 2014-05-05: qty 1000

## 2014-05-05 MED ORDER — MAGNESIUM SULFATE IN D5W 10-5 MG/ML-% IV SOLN
1.0000 g | Freq: Once | INTRAVENOUS | Status: AC
Start: 2014-05-05 — End: 2014-05-05
  Administered 2014-05-05: 1 g via INTRAVENOUS
  Filled 2014-05-05: qty 100

## 2014-05-05 NOTE — Progress Notes (Signed)
[place] NOTE  Pharmacy Consult for TPN Indication: Unresolving SBO  Allergies  Allergen Reactions  . Morphine And Related Other (See Comments)    Blood sugar dropped     Patient Measurements: Height: 4\' 11"  (149.9 cm) (historical ) Weight: 168 lb 3.4 oz (76.301 kg) IBW/kg (Calculated) : 43.2 Usual Weight: 76 kg  Vital Signs: Temp: 99 F (37.2 C) (07/04 0455) Temp src: Oral (07/04 0455) BP: 158/58 mmHg (07/04 0455) Pulse Rate: 72 (07/04 0455) Intake/Output from previous day: 07/03 0701 - 07/04 0700 In: 2618.2 [P.O.:600; I.V.:1214.2; TPN:804] Out: 1175 [Urine:1175] Intake/Output from this shift:    Labs:  Recent Labs  05/03/14 0515  WBC 6.6  HGB 9.0*  HCT 26.7*  PLT 132*     Recent Labs  05/03/14 0515 05/04/14 0455 05/05/14 0432  NA 138 139 139  K 3.9 3.9 4.2  CL 105 106 105  CO2 23 24 23   GLUCOSE 132* 114* 106*  BUN 16 14 13   CREATININE 0.71 0.74 0.77  CALCIUM 8.2* 8.4 8.8  MG 2.1 1.9 1.9  PHOS 3.1 2.8  --   PROT 5.1*  --   --   ALBUMIN 2.3*  --   --   AST 16  --   --   ALT 14  --   --   ALKPHOS 58  --   --   BILITOT 0.3  --   --   PREALBUMIN 8.1*  --   --   TRIG 106  --   --    Estimated Creatinine Clearance: 48.3 ml/min (by C-G formula based on Cr of 0.77).    Recent Labs  05/04/14 2008 05/04/14 2355 05/05/14 0344  GLUCAP 172* 118* 123*    Insulin Requirements in the past 12 hours:  3 units SSI- CBGs 118-172  Current Nutrition:  - Clear liquid diet - Clinimix E 5/15 at 74mL/hr + lipid 20% emulsion at 104mL/hr- provides 72g protein daily and 1358kcal daily- 100% of goals - Also has D5/NS running at 39mL/hr (per MD to increase UOP) which provides an extra 240kcal from dextrose  Nutritional Goals: (per RD note 7/2) Kcal: 1350 - 1520  Protein: 70 - 80 g  Assessment: 83 YOF presented with several days of abdominal distension, nausea, and vomiting- with pSBO on CT now s/p ex lap with lysis of adhesions 6/28 as  medical management was not improving obstruction and small bowel was becoming more dilated.  GI: Hx SBO and with a recurrent SBO this admit. S/p ex-lap with lysis of adhesions with repair of periumbilical ventral hernia on 6/28. Still with some abd pain, cramping, and indigestion. Pt has good BS, flatus, possible small BM. X-ray on 7/1 showed resolving SBO. NG tubed clamped on 7/2 - pulled out by the pt on 7/3 AM. Prealbumin 8.1 (7/2) << 9 (6/28), trigs remain normal at 106. Pt transitioned to clears on 7/3 - tolerating well, advancing to full liquids today. Per surgery to start to wean TPN. On PPI IV Endo: hx DM- A1C 5.4%, CBGs 116-185 on SSI Lytes: K 4.2 << 3.9 (s/p KCl 10 mEq IV x 2 runs on 7/3), Mg 1.9 << 1.9 (s/p Magnesium 1g IV on 7/3), Phos 2.8, CoCa 10. Will give Mg 1g IV x 1 again today for repletion Renal: Scr 0.77- stable, CrCl ~45-4mL/min. Surgery increased IVF in hopes to help with UOP- charting on UOP inaccurate (not all recorded) - but resolved per surgery note. Will decrease IVF to [facility] today since  the pt on TPN and clears. Pulm: 99% on RA- no meds Cards: Hx HTN/DL. BP wnl-elevated, HR wnl. On no CV meds meds Hepatobil: LFTs remain WNL except for albumin being low at 2.3 Neuro: Hx CVA/anxiety ID:  Afebrile, WBC wnl, on no abx Heme/Onc: Hgb 9- at baseline. Platelets low, but increasing-no bleeding noted. Best Practices: MC, LMWH TPN Access: PICC 6/27 TPN day#: 6 (started 6/27)  Plan:  1. Reduce Clinimix E 5/15 to 35 ml/hr with lipid 20% emulsion at 5 ml/hr to start weaning and help with diet progression 2. IV multivitamin and trace elements daily in TPN 3. Mg 1g IV x 1 dose today 4. Reduce IVF to [facility] since pt on TPN + clears and UOP issues have resolved  5. Continue SSI as ordered 6. Follow tolerance of diet advancement 7. BMET + Mg in the AM  Alycia Rossetti, PharmD, BCPS Clinical Pharmacist Pager: [phone] 05/05/2014 7:39 AM
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
## 2014-05-05 NOTE — Progress Notes (Signed)
Patient ID: Erika Day, female   DOB: 02/11/1930, 80 y.o.   MRN: [id number]  [facility] - Progress Note  POD# 6  Subjective: Patient without complaints this AM.  Denies nausea or pain with NG out overnight.  Tolerating clear liquid diet.  Thinks she had small BM, passing flatus.  Objective: Vital signs in last 24 hours: Temp:  [98.4 F (36.9 C)-99.8 F (37.7 C)] 99 F (37.2 C) (07/04 0455) Pulse Rate:  [72-78] 72 (07/04 0455) Resp:  [18-20] 18 (07/04 0455) BP: (133-171)/(58-72) 158/58 mmHg (07/04 0455) SpO2:  [96 %-99 %] 99 % (07/04 0455) Last BM Date: 05/23/14  Intake/Output from previous day: 07/03 0701 - 07/04 0700 In: 2618.2 [P.O.:600; I.V.:1214.2; TPN:804] Out: 1175 [Urine:1175]  Exam: HEENT - clear, not icteric Neck - soft Chest - clear bilaterally Cor - RRR, no murmur Abd - soft without distension; BS present Ext - no significant edema Neuro - grossly intact, no focal deficits  Lab Results:   Recent Labs  05/03/14 0515  WBC 6.6  HGB 9.0*  HCT 26.7*  PLT 132*     Recent Labs  05/04/14 0455 05/05/14 0432  NA 139 139  K 3.9 4.2  CL 106 105  CO2 24 23  GLUCOSE 114* 106*  BUN 14 13  CREATININE 0.74 0.77  CALCIUM 8.4 8.8    Studies/Results: No results found.  Assessment / Plan: 1. Status post ex lap with LOA for SBO   Advance to full liquid diet today   Would begin to wean TNA   Encourage OOB, ambulation   Earnstine Regal, MD, [facility] Office: [phone]  05/05/2014

## 2014-05-06 LAB — GLUCOSE, CAPILLARY
Glucose-Capillary: 107 mg/dL — ABNORMAL HIGH (ref 70–99)
Glucose-Capillary: 111 mg/dL — ABNORMAL HIGH (ref 70–99)
Glucose-Capillary: 116 mg/dL — ABNORMAL HIGH (ref 70–99)
Glucose-Capillary: 116 mg/dL — ABNORMAL HIGH (ref 70–99)
Glucose-Capillary: 118 mg/dL — ABNORMAL HIGH (ref 70–99)
Glucose-Capillary: 80 mg/dL (ref 70–99)

## 2014-05-06 MED ORDER — BISACODYL 10 MG RE SUPP
10.0000 mg | Freq: Once | RECTAL | Status: AC
  Administered 2014-05-06: 10 mg via RECTAL
  Filled 2014-05-06: qty 1

## 2014-05-06 MED ORDER — PANTOPRAZOLE SODIUM 40 MG PO TBEC
40.0000 mg | DELAYED_RELEASE_TABLET | Freq: Every day | ORAL | Status: DC
  Administered 2014-05-07 – 2014-05-08 (×2): 40 mg via ORAL
  Filled 2014-05-06 (×2): qty 1

## 2014-05-06 MED ORDER — INSULIN ASPART 100 UNIT/ML ~~LOC~~ SOLN: 0.0000 [IU] | Freq: Three times a day (TID) | SUBCUTANEOUS | Status: DC

## 2014-05-06 MED ORDER — INSULIN ASPART 100 UNIT/ML ~~LOC~~ SOLN
0.0000 [IU] | Freq: Every day | SUBCUTANEOUS | Status: DC
Start: 2014-05-06 — End: 2014-05-08

## 2014-05-06 NOTE — Progress Notes (Signed)
Due to pt's surgical dressing being soiled by stool, dressing was replaced with 2 foam pads.  CDI. No complaints or issues from pt.  Surgical staples intact.  Will continue to monitor. Syliva Overman

## 2014-05-06 NOTE — Progress Notes (Addendum)
[place] NOTE  Pharmacy Consult for TPN Indication: Unresolving SBO  Allergies  Allergen Reactions  . Morphine And Related Other (See Comments)    Blood sugar dropped     Patient Measurements: Height: 4\' 11"  (149.9 cm) (historical ) Weight: 168 lb 3.4 oz (76.301 kg) IBW/kg (Calculated) : 43.2 Usual Weight: 76 kg  Vital Signs: Temp: 99.5 F (37.5 C) (07/05 0512) Temp src: Oral (07/04 2155) BP: 132/64 mmHg (07/05 0512) Pulse Rate: 71 (07/05 0512) Intake/Output from previous day: 07/04 0701 - 07/05 0700 In: 1937.3 [P.O.:400; I.V.:421.3; IV Piggyback:100; TPN:1016] Out: -  Intake/Output from this shift:    Labs: No results found for this basename: WBC, HGB, HCT, PLT, APTT, INR,  in the last 72 hours   Recent Labs  05/04/14 0455 05/05/14 0432  NA 139 139  K 3.9 4.2  CL 106 105  CO2 24 23  GLUCOSE 114* 106*  BUN 14 13  CREATININE 0.74 0.77  CALCIUM 8.4 8.8  MG 1.9 1.9  PHOS 2.8  --    Estimated Creatinine Clearance: 48.3 ml/min (by C-G formula based on Cr of 0.77).    Recent Labs  05/05/14 2027 05/06/14 0029 05/06/14 0438  GLUCAP 130* 118* 111*    Insulin Requirements in the past 12 hours:  1 units SSI  Current Nutrition:  - Full liquid diet (90% of lunch on 7/4, supper not chartd, 75% of breakfast on 7/5) - Clinimix E 5/15 at 35 mL/hr + lipid 20% emulsion at 57mL/hr- provides 42g protein daily and 836 kcal daily- 100% of goals - Also has D5/NS running at 10 ml/hr [facility]) which provides an extra 48kcal from dextrose  Nutritional Goals: (per RD note 7/2) Kcal: 1350 - 1520  Protein: 70 - 80 g  Assessment: 89 YOF presented with several days of abdominal distension, nausea, and vomiting- with pSBO on CT now s/p ex lap with lysis of adhesions 6/28 as medical management was not improving obstruction and small bowel was becoming more dilated.  GI: Hx SBO and with a recurrent SBO this admit. S/p ex-lap with lysis of adhesions with repair of  periumbilical ventral hernia on 6/28. Still with some abd pain, cramping, and indigestion. Pt has good BS, flatus, passed BM on 7/4. X-ray on 7/1 showed resolving SBO. NG tubed clamped on 7/2 - pulled out by the pt on 7/3 AM. Prealbumin 8.1 (7/2) << 9 (6/28), trigs remain normal at 106. Pt transitioned to full liquid diet on 7/4 - tolerating well. TPN being weaned - discussed with surgery, today will be the patient's last bag, will wean OFF today. On PPI IV Endo: hx DM- A1C 5.4%, CBGs 107-178 on SSI Lytes: No new labs today, from 7/4: K 4.2 << 3.9 (s/p KCl 10 mEq IV x 2 runs on 7/3), Mg 1.9 << 1.9 (s/p Magnesium 1g IV on 7/3), Phos 2.8, CoCa 10.  Renal: Scr 0.77- stable, CrCl ~45-62mL/min. IVF at [facility]: 98% on RA- no meds Cards: Hx HTN/DL. BP wnl-elevated, HR wnl. On no CV meds meds Hepatobil: LFTs remain WNL except for albumin being low at 2.3 Neuro: Hx CVA/anxiety ID:  Tmax/24h: 99.5, WBC wnl, on no abx Heme/Onc: Hgb 9- at baseline. Platelets low, but increasing-no bleeding noted. Best Practices: MC, LMWH TPN Access: PICC 6/27 TPN day#: 8 (started 6/27)  Plan:  1. Continue current TPN bag until completion this evening - then TPN will be d/ced.  2. Will d/c TPN labs 3. Will adjust SSI orders to  tidwc/hs 4. Will change PPI from IV to po per P&T protocol 5. Pharmacy will s/o - please re-consult [place] if additional TPN orders are needed.   Alycia Rossetti, PharmD, BCPS Clinical Pharmacist Pager: [phone] 05/06/2014 7:21 AM

## 2014-05-06 NOTE — Progress Notes (Signed)
Pt ambulated to [facility] and tolerated well.  Suppository given.  Will continue to monitor. Syliva Overman

## 2014-05-06 NOTE — Progress Notes (Signed)
Patient ID: Erika Day, female   DOB: October 12, 1930, 80 y.o.   MRN: [id number]  [facility] - Progress Note  POD# 7  Subjective: Patient taking full liquids for breakfast - denies nausea or emesis this AM.  Two episodes emesis yesterday per patient.  Feels like needs to have BM.  Objective: Vital signs in last 24 hours: Temp:  [98.3 F (36.8 C)-99.5 F (37.5 C)] 99.5 F (37.5 C) (07/05 0512) Pulse Rate:  [71-80] 71 (07/05 0512) Resp:  [16-18] 16 (07/05 0512) BP: (132-172)/(58-70) 132/64 mmHg (07/05 0512) SpO2:  [98 %-100 %] 98 % (07/05 0512) Last BM Date: 05/05/14  Intake/Output from previous day: 07/04 0701 - 07/05 0700 In: 1937.3 [P.O.:400; I.V.:421.3; IV Piggyback:100; TPN:1016] Out: -   Exam: HEENT - clear, not icteric Neck - soft Chest - clear bilaterally Cor - RRR, no murmur Abd - soft, mild distension; BS present; wound clear and dry and intact Ext - no significant edema Neuro - grossly intact, no focal deficits  Lab Results:  No results found for this basename: WBC, HGB, HCT, PLT,  in the last 72 hours   Recent Labs  05/04/14 0455 05/05/14 0432  NA 139 139  K 3.9 4.2  CL 106 105  CO2 24 23  GLUCOSE 114* 106*  BUN 14 13  CREATININE 0.74 0.77  CALCIUM 8.4 8.8    Studies/Results: No results found.  Assessment / Plan: 1. Status post ex lap with LOA for SBO   Remain on full liquids today   Wean TNA   Encourage OOB, ambulation   Dulcolax supp today  Earnstine Regal, MD, [facility] Office: [phone]  05/06/2014

## 2014-05-07 LAB — BASIC METABOLIC PANEL
Anion gap: 12 (ref 5–15)
BUN: 12 mg/dL (ref 6–23)
CO2: 23 meq/L (ref 19–32)
Calcium: 9.2 mg/dL (ref 8.4–10.5)
Chloride: 102 mEq/L (ref 96–112)
Creatinine, Ser: 0.93 mg/dL (ref 0.50–1.10)
GFR calc non Af Amer: 56 mL/min — ABNORMAL LOW (ref >90)
GFR, EST AFRICAN AMERICAN: 65 mL/min — AB (ref >90)
Glucose, Bld: 84 mg/dL (ref 70–99)
POTASSIUM: 4.2 meq/L (ref 3.7–5.3)
Sodium: 137 mEq/L (ref 137–147)

## 2014-05-07 LAB — GLUCOSE, CAPILLARY
GLUCOSE-CAPILLARY: 120 mg/dL — AB (ref 70–99)
Glucose-Capillary: 96 mg/dL (ref 70–99)
Glucose-Capillary: 86 mg/dL (ref 70–99)

## 2014-05-07 LAB — CBC
HEMATOCRIT: 26 % — AB (ref 36.0–46.0)
HEMOGLOBIN: 8.6 g/dL — AB (ref 12.0–15.0)
MCH: 28.3 pg (ref 26.0–34.0)
MCHC: 33.1 g/dL (ref 30.0–36.0)
MCV: 85.5 fL (ref 78.0–100.0)
Platelets: 181 10*3/uL (ref 150–400)
RBC: 3.04 MIL/uL — AB (ref 3.87–5.11)
RDW: 13.7 % (ref 11.5–15.5)
WBC: 8.7 10*3/uL (ref 4.0–10.5)

## 2014-05-07 MED ORDER — BISACODYL 10 MG RE SUPP
10.0000 mg | Freq: Once | RECTAL | Status: AC
  Administered 2014-05-07: 10 mg via RECTAL
  Filled 2014-05-07: qty 1

## 2014-05-07 NOTE — Care Management Note (Signed)
  Page 2 of 2   05/07/2014     4:15:49 PM CARE MANAGEMENT NOTE 05/07/2014  Patient:  Erika Day, Erika Day   Account Number:  [id number]  Date Initiated:  04/30/2014  Documentation initiated by:  Marvetta Gibbons  Subjective/Objective Assessment:   Pt admitted with SBO, now s/p exp lap on 04/29/14     Action/Plan:   PTA pt lived at home with spouse- PT/OT ordered post op   Anticipated DC Date:  05/08/2014   Anticipated DC Plan:        DC Planning Services  CM consult      Choice offered to / List presented to:  C-1 Patient   DME arranged  3-N-1  [place] arranged  [place] agency  [place].   Status of service:  In process, will continue to follow Medicare Important Message given?  YES (If response is "NO", the following Medicare IM given date fields will be blank) Date Medicare IM given:  05/03/2014 Medicare IM given by:  Magdalen Spatz Date Additional Medicare IM given:  05/07/2014 Additional Medicare IM given by:    Discharge Disposition:    Per UR Regulation:  Reviewed for med. necessity/level of care/duration of stay  If discussed at [place] of Stay Meetings, dates discussed:   05/01/2014  05/03/2014    Comments:  05-07-14 Spoke with patient and [name] , patient states she wants to go home and has 24 hour assistance at home .  Address [address] , [place] , phone [phone] , Husband Johnny cell is [phone] , Patrick cell [phone] .   Magdalen Spatz RN BSN  [phone]   04/30/14- 1200- Marvetta Gibbons RN, BSN [phone] Pt s/p Exp Lap on 04/29/14,  will give a fluid bolus today as well as increase her IVFs some.  Will increase fluids to 50cc/hr with TNA at 60cc/hr to equal about 110cc/hr and see if this will help BP and UOP.  She has no h/o CHF or heart issues, so I think she can handle this.  Will continue to monitor in the SDU  today. 2. Cont NGT and await bowel function. 3. Cont TNA until able to tolerate a diet

## 2014-05-07 NOTE — Progress Notes (Signed)
Patient ID: Erika Day, female   DOB: [birth year], 80 y.o.   MRN: [id number] 8 Days Post-Op  Subjective: Pt feels well.  Had some BMs and passing flatus.  Tolerating her full liquids.  Objective: Vital signs in last 24 hours: Temp:  [98.3 F (36.8 C)-99.3 F (37.4 C)] 98.6 F (37 C) (07/06 0512) Pulse Rate:  [71-78] 73 (07/06 0512) Resp:  [16-18] 16 (07/06 0512) BP: (149-161)/(57-63) 161/63 mmHg (07/06 0512) SpO2:  [96 %-97 %] 96 % (07/06 0512) Last BM Date: 05/06/14  Intake/Output from previous day: 07/05 0701 - 07/06 0700 In: 500.7 [P.O.:240; I.V.:260.7] Out: 1050 [Urine:1050] Intake/Output this shift: Total I/O In: -  Out: 200 [Emesis/NG output:200]  PE: Abd: soft, incision c/d/i with staples, +BS, ND  Lab Results:   Recent Labs  05/07/14 0448  WBC 8.7  HGB 8.6*  HCT 26.0*  PLT 181   BMET  Recent Labs  05/05/14 0432 05/07/14 0448  NA 139 137  K 4.2 4.2  CL 105 102  CO2 23 23  GLUCOSE 106* 84  BUN 13 12  CREATININE 0.77 0.93  CALCIUM 8.8 9.2   PT/INR No results found for this basename: LABPROT, INR,  in the last 72 hours CMP     Component Value Date/Time   NA 137 05/07/2014 0448   K 4.2 05/07/2014 0448   CL 102 05/07/2014 0448   CO2 23 05/07/2014 0448   GLUCOSE 84 05/07/2014 0448   BUN 12 05/07/2014 0448   CREATININE 0.93 05/07/2014 0448   CALCIUM 9.2 05/07/2014 0448   PROT 5.1* 05/03/2014 0515   ALBUMIN 2.3* 05/03/2014 0515   AST 16 05/03/2014 0515   ALT 14 05/03/2014 0515   ALKPHOS 58 05/03/2014 0515   BILITOT 0.3 05/03/2014 0515   GFRNONAA 56* 05/07/2014 0448   GFRAA 65* 05/07/2014 0448   Lipase     Component Value Date/Time   LIPASE 34 04/23/2014 1052       Studies/Results: No results found.  Anti-infectives: Anti-infectives   Start     Dose/Rate Route Frequency Ordered Stop   04/29/14 1400  cefOXitin (MEFOXIN) 2 g in dextrose 5 % 50 mL IVPB     2 g 100 mL/hr over 30 Minutes Intravenous  Once 04/29/14 1231 04/29/14 1416       Assessment/Plan  1. POD 8, s/p ex lap with LOA for SBO 2. HTN 3. PCM/TNA  Plan: 1. TNA weaned yesterday 2. Advanced to carb mod diet 3. PT recommends CIR vs short term SNF due to failure to progress enough to be safe at home without 24 hour supervision.  Patient is agreeable to this.  LOS: 14 days    Izick Gasbarro E 05/07/2014, 10:50 AM Pager: [phone]

## 2014-05-07 NOTE — Progress Notes (Signed)
Patient feeling well.  Tolerating solid diet Requests one suppository.  Passing gas but no BM today.  CIR soon - possibly tomorrow.  Erika Day. Georgette Dover, MD, [facility]  General/ Trauma Surgery  05/07/2014 2:42 PM

## 2014-05-07 NOTE — Progress Notes (Signed)
Thank you for consult on Erika Day. Chart reviewed and note that patient was started on full liquid diet this weekend. Family issues regarding safety with discharge to home documented. Therapy ongoing and  HH therapies recommended for follow up. Will follow at a distance and defer CIR consult for now.

## 2014-05-07 NOTE — Progress Notes (Signed)
CSW (Clinical Education officer, museum) met with pt and [name] Speaks at pt bedside. CSW discussed recommendations from PT and pt home situation. After much discussion, pt has decided she wants to dc home with her husband. Pt will have supervision at home. CSW did advise that pt ask her family to coordinate their schedules so she has visitors through out the day as well. [name] wondering about home health services and equipment. CSW informed pt and [name] that RN CM would come in and discuss this with them. CSW notified RN CM and recommended home health social work be added to list of services. At this time, pt has no further hospital social work needs. CSW signing off.   [place], [place]

## 2014-05-07 NOTE — Evaluation (Signed)
Physical Therapy Evaluation Patient Details Name: Erika Day MRN: [id number] DOB: 02/29/1930 Today's Date: 05/07/2014   History of Present Illness  Erika Day is a 80 y.o. female with a past medical history of CVA, type 2 diabetes, hypertension, dyslipidemia, history of ovarian mass, small bowel obstruction, status post exploratory laparotomy in 2012 performed at [facility], poor functional status, presenting to the emergency department with complaints of abdominal pain, nausea and vomiting.  Clinical Impression  Making steady progress, but could use some rehab to improve function and independence.  Pt has only limited assistance at home.    Follow Up Recommendations CIR;Other (comment) (pt hasn't progress enough to be safe with available assist)    Equipment Recommendations  None recommended by PT    Recommendations for Other Services       Precautions / Restrictions Precautions Precautions: Fall      Mobility  Bed Mobility Overal bed mobility: Needs Assistance Bed Mobility: Supine to Sit     Supine to sit: Min assist     General bed mobility comments: Assist up onto R elbow due to abdominal pain.  Transfers Overall transfer level: Needs assistance Equipment used: Rolling walker (2 wheeled) Transfers: Sit to/from [facility] Sit to Stand: Min guard;Min assist (min lower surface and without arms) Stand pivot transfers: Min assist       General transfer comment: guard for safety and assit to come forward on low surfaces  Ambulation/Gait Ambulation/Gait assistance: Min assist Ambulation Distance (Feet): 72 Feet (additional 45 feet after sitting rest break) Assistive device: Rolling walker (2 wheeled) Gait Pattern/deviations: Step-through pattern;Trunk flexed Gait velocity: decreased   General Gait Details: cues for postural checks, stability assist, help maneuvering RW .   Stairs            Wheelchair Mobility    Modified Rankin (Stroke  Patients Only)       Balance Overall balance assessment: Needs assistance Sitting-balance support: Feet supported;No upper extremity supported Sitting balance-Leahy Scale: Fair     Standing balance support: No upper extremity supported;Single extremity supported Standing balance-Leahy Scale: Fair Standing balance comment: needs 1 UE assist for more dynamic tasks ( wiping herself), but no hads for standing statically at [facility]                             Pertinent Vitals/Pain     Home Living                        Prior Function                 Hand Dominance        Extremity/Trunk Assessment                         Communication      Cognition Arousal/Alertness: Awake/alert Behavior During Therapy: WFL for tasks assessed/performed Overall Cognitive Status: Within Functional Limits for tasks assessed                      General Comments      Exercises        Assessment/Plan    PT Assessment    PT Diagnosis     PT Problem List    PT Treatment Interventions     PT Goals (Current goals can be found in the Care Plan section) Acute Rehab PT Goals PT  Goal Formulation: With patient/family Time For Goal Achievement: 05/09/14 Potential to Achieve Goals: Good    Frequency Min 3X/week   Barriers to discharge        Co-evaluation               End of Session   Activity Tolerance: Patient limited by fatigue Patient left: in chair;with call bell/phone within reach Nurse Communication: Mobility status         Time: 8891-6945 PT Time Calculation (min): 36 min   Charges:     PT Treatments $Gait Training: 8-22 mins $Therapeutic Activity: 8-22 mins   PT G Codes:          Julie Paolini, Tessie Fass 05/07/2014, 10:45 AM 05/07/2014  Donnella Sham, [place] [phone]  (pager)

## 2014-05-08 ENCOUNTER — Telehealth (INDEPENDENT_AMBULATORY_CARE_PROVIDER_SITE_OTHER): Payer: Self-pay | Admitting: General Surgery

## 2014-05-08 LAB — GLUCOSE, CAPILLARY: GLUCOSE-CAPILLARY: 72 mg/dL (ref 70–99)

## 2014-05-08 MED ORDER — TRAMADOL HCL 50 MG PO TABS: 50.0000 mg | ORAL_TABLET | Freq: Four times a day (QID) | ORAL | Status: DC | PRN

## 2014-05-08 NOTE — Progress Notes (Signed)
SW notes reviewed--patient plans on going home with her husband. [employer] will not cover CIR stay for current diagnosis. Would recommend supervision with HHPT for follow up therapies past discharge. Will sign off.

## 2014-05-08 NOTE — Telephone Encounter (Signed)
Megan Dort sent a message to have the patient to come in to have staples removal on 05-14-14 for a nurse only.

## 2014-05-08 NOTE — Discharge Summary (Signed)
Agree with above. Patient tolerating diet  Ready for discharge - staples out next week.  Imogene Burn. Georgette Dover, MD, [facility]  General/ Trauma Surgery  05/08/2014 1:25 PM

## 2014-05-08 NOTE — Discharge Instructions (Signed)
[facility]      [facility] [phone]  OPEN ABDOMINAL SURGERY: POST OP INSTRUCTIONS  Always review your discharge instruction sheet given to you by the facility where your surgery was performed.  IF YOU HAVE DISABILITY OR FAMILY LEAVE FORMS, YOU MUST BRING THEM TO THE OFFICE FOR PROCESSING.  PLEASE DO NOT GIVE THEM TO YOUR DOCTOR.  1. A prescription for pain medication may be given to you upon discharge.  Take your pain medication as prescribed, if needed.  If narcotic pain medicine is not needed, then you may take acetaminophen (Tylenol) or ibuprofen (Advil) as needed. 2. Take your usually prescribed medications unless otherwise directed. 3. If you need a refill on your pain medication, please contact your pharmacy. They will contact our office to request authorization.  Prescriptions will not be filled after 5pm or on week-ends. 4. You should follow a light diet the first few days after arrival home, such as soup and crackers, pudding, etc.unless your doctor has advised otherwise. A high-fiber, low fat diet can be resumed as tolerated.   Be sure to include lots of fluids daily. Most patients will experience some swelling and bruising on the chest and neck area.  Ice packs will help.  Swelling and bruising can take several days to resolve 5. Most patients will experience some swelling and bruising in the area of the incision. Ice pack will help. Swelling and bruising can take several days to resolve..  6. It is common to experience some constipation if taking pain medication after surgery.  Increasing fluid intake and taking a stool softener will usually help or prevent this problem from occurring.  A mild laxative (Milk of Magnesia or Miralax) should be taken according to package directions if there are no bowel movements after 48 hours. 7.  You may have steri-strips (small skin tapes) in place directly over the incision.  These strips should be left on the skin for 7-10 days.  If your  surgeon used skin glue on the incision, you may shower in 24 hours.  The glue will flake off over the next 2-3 weeks.  Any sutures or staples will be removed at the office during your follow-up visit. You may find that a light gauze bandage over your incision may keep your staples from being rubbed or pulled. You may shower and replace the bandage daily. 8. ACTIVITIES:  You may resume regular (light) daily activities beginning the next day--such as daily self-care, walking, climbing stairs--gradually increasing activities as tolerated.  You may have sexual intercourse when it is comfortable.  Refrain from any heavy lifting or straining until approved by your doctor. a. You may drive when you no longer are taking prescription pain medication, you can comfortably wear a seatbelt, and you can safely maneuver your car and apply brakes b. Return to Work: ___________________________________ 9. You should see your doctor in the office for a follow-up appointment approximately two weeks after your surgery.  Make sure that you call for this appointment within a day or two after you arrive home to insure a convenient appointment time. OTHER INSTRUCTIONS:  _____________________________________________________________ _____________________________________________________________  WHEN TO CALL YOUR DOCTOR: 1. Fever over 101.0 2. Inability to urinate 3. Nausea and/or vomiting 4. Extreme swelling or bruising 5. Continued bleeding from incision. 6. Increased pain, redness, or drainage from the incision. 7. Difficulty swallowing or breathing 8. Muscle cramping or spasms. 9. Numbness or tingling in hands or feet or around lips.  The clinic staff is available to   answer your questions during regular business hours.  Please don't hesitate to call and ask to speak to one of the nurses if you have concerns.  For further questions, please visit [email or web address]   

## 2014-05-08 NOTE — Discharge Summary (Signed)
[facility] Discharge Summary   Patient ID: Erika Day MRN: [id number] DOB/AGE: 1930-10-13 80 y.o.  Admit date: 04/23/2014 Discharge date: 05/08/2014  Admitting Diagnosis: SBO  Discharge Diagnosis Patient Active Problem List   Diagnosis Date Noted  . SBO (small bowel obstruction) 04/23/2014  . Abdominal pain, unspecified site 04/23/2014  . HTN (hypertension), benign 09/06/2011  . DM (diabetes mellitus), type 2, uncontrolled 09/06/2011  . Ovarian cancer 09/06/2011    Consultants Internal Medicine (Dr. Nada Maclachlan, Dr. Coralyn Pear)  Imaging: No results found.  Procedures Dr. Georgette Dover (04/29/14) - Exploratory laparotomy with extensive lysis of adhesions (greater than 60 minutes)   Hospital Course:  80 year female with a history of hypertension, stroke, GERD, oophorectomy/hysterectomy for a benign pelvic mass which was causing a bowel obstruction in 2012 at [facility], cholecystectomy and DDD mostly wheelchair bound. She denies a history of diabetes mellitus. Denies other abdominal surgeries.   She presents to [facility] on 04/23/14 with abdominal pain for 3 weeks. Onset was gradual. Coarse is worsening. Time pattern is intermittent. Moderate in severity. Suddenly worsened about 3 days ago. Aggravated by movement. No alleviating factors. Associated with nausea and vomiting. She reports diarrhea x4 days now, non bloody. She is normally constipated. She takes miralax on daily basis. She was seen by her primary care provider last Thursday and placed on prevacid which did not help her symptoms. Appetite has been adequate. Last oral intake was last night. She was able to drink her contrast and tolerate water today. She denies fever, chills or sweats. She denies recent weight loss. She denies previous cardiac history and does not take any blood thinners.   Workup showed SBO on CT scan, but she was clinically a partial SBO.  She was treated conservatively for several days with NG tube  decompression and bowel rest, and she seemed to improve thus her NG was clamped.  On 6/27-28th she seemed to revert backwards and her NG output went up and some of her symptoms returned.  It was decided to proceed with surgical intervention.  Patient underwent procedure listed above.  Tolerated procedure well and was transferred to the floor.    During this hospitalization the patient's antihypertensive medications including verapamil and lisinopril were held. Blood pressure some what low post surgery however during this hospitalization has ranged mostly between 120-150. We resumed verapamil and lisinopril when able to swallow. In the meantime would recommend controlling blood pressure with hydralazine 10 mg IV every 6 when necessary for systolic blood pressure greater than 160. Patient does not have any history of diabetes. During the hospitalization the patient was found to have hypoglycemic episodes when the patient was N.p.o.on NG tube suction. She did require infusion of D5 normal saline. However now the patient is on TPN and her CBGs have been stable.  She did experience a post-operative ileus associated with N/V with advancement of diet.  Her ileus soon resolved.  CIR was considered, but she did not qualify and rehab recommended [facility] PT/OT.  PT/OT worked with the patient and subsequently recommended [facility] PT/OT.  Diet was advanced as tolerated.  On POD #9, the patient was voiding well, tolerating diet, ambulating well, pain well controlled, vital signs stable, incisions c/d/i and felt stable for discharge home.  Patient will follow up in our office in 2-3 weeks and knows to call with questions or concerns.  She will follow up with Dr. Vonna Kotyk nurse for staple removal on Monday 05/14/14.     Physical Exam: General:  Alert, NAD, pleasant, comfortable Abd:  Soft, ND, mild tenderness, midline incision C/D/I with staples in place      Medication List         aspirin EC 325 MG tablet  Take 325 mg by  mouth daily.     lansoprazole 15 MG capsule  Commonly known as:  PREVACID  Take 15 mg by mouth daily at 12 noon.     lisinopril 10 MG tablet  Commonly known as:  PRINIVIL,ZESTRIL  Take 10 mg by mouth daily.     LORazepam 1 MG tablet  Commonly known as:  ATIVAN  Take 0.5 mg by mouth at bedtime as needed for anxiety. As needed for anxiety     oxymetazoline 0.05 % nasal spray  Commonly known as:  AFRIN  Place 1-2 sprays into both nostrils 2 (two) times daily as needed for congestion.     traMADol 50 MG tablet  Commonly known as:  ULTRAM  Take 1-2 tablets (50-100 mg total) by mouth every 6 (six) hours as needed for moderate pain or severe pain.     verapamil 120 MG CR tablet  Commonly known as:  CALAN-SR  Take 1 tablet (120 mg total) by mouth daily.             Follow-up Information   Follow up with TSUEI,MATTHEW K., MD. Schedule an appointment as soon as possible for a visit in 3 weeks. (For post-operation check)    Specialty:  General Surgery   Contact information:   [place] [address] [phone]       Follow up with CCS,MD, MD. Schedule an appointment as soon as possible for a visit on 05/14/2014. (For wound re-check and staple removal with Dr. Vonna Kotyk nurse)    Specialty:  General Surgery      Signed: Coralie Keens, [facility] [phone]  05/08/2014, 10:34 AM

## 2014-05-14 ENCOUNTER — Encounter (INDEPENDENT_AMBULATORY_CARE_PROVIDER_SITE_OTHER): Payer: Self-pay

## 2014-05-14 ENCOUNTER — Ambulatory Visit (INDEPENDENT_AMBULATORY_CARE_PROVIDER_SITE_OTHER): Payer: PRIVATE HEALTH INSURANCE

## 2014-05-14 VITALS — BP 116/80 | HR 66 | Temp 97.1°F | Resp 16

## 2014-05-14 DIAGNOSIS — Z4802 Encounter for removal of sutures: Secondary | ICD-10-CM

## 2014-05-14 NOTE — Progress Notes (Signed)
Patient comes into office today for staple removal.  Patient status post Exploratory Laparotomy on 04/29/14.  Incision appears to be healing well, without redness, swelling or drainage.  Patient denies having any fevers, incisional pain, nausea or vomiting.  Proceeded to remove staples.  Incision intact.  Placed steri-strips over incision.  Patient tolerated well.  Patient has post op appointment on 05/24/14 @ 9:50 am with Dr. Georgette Dover.  Patient advised to call our office if she has any questions or concerns.  Patient verbalized understanding.

## 2014-05-24 ENCOUNTER — Encounter (INDEPENDENT_AMBULATORY_CARE_PROVIDER_SITE_OTHER): Payer: Self-pay | Admitting: Surgery

## 2014-05-24 ENCOUNTER — Ambulatory Visit (INDEPENDENT_AMBULATORY_CARE_PROVIDER_SITE_OTHER): Payer: PRIVATE HEALTH INSURANCE | Admitting: Surgery

## 2014-05-24 VITALS — BP 162/90 | HR 68 | Temp 98.2°F | Resp 16 | Ht 64.0 in | Wt 168.0 lb

## 2014-05-24 DIAGNOSIS — K56609 Unspecified intestinal obstruction, unspecified as to partial versus complete obstruction: Secondary | ICD-10-CM

## 2014-05-24 NOTE — Progress Notes (Signed)
Status post recent exploratory laparotomy, small bowel resection, and repair of ventral hernia for a small bowel obstruction. Her discharge summary is copied below. She was discharged on 05/08/14.    80 year female with a history of hypertension, stroke, GERD, oophorectomy/hysterectomy for a benign pelvic mass which was causing a bowel obstruction in 2012 at [facility], cholecystectomy and DDD mostly wheelchair bound. She denies a history of diabetes mellitus. Denies other abdominal surgeries.  She presents to [facility] on 04/23/14 with abdominal pain for 3 weeks. Onset was gradual. Coarse is worsening. Time pattern is intermittent. Moderate in severity. Suddenly worsened about 3 days ago. Aggravated by movement. No alleviating factors. Associated with nausea and vomiting. She reports diarrhea x4 days now, non bloody. She is normally constipated. She takes miralax on daily basis. She was seen by her primary care provider last Thursday and placed on prevacid which did not help her symptoms. Appetite has been adequate. Last oral intake was last night. She was able to drink her contrast and tolerate water today. She denies fever, chills or sweats. She denies recent weight loss. She denies previous cardiac history and does not take any blood thinners.  Workup showed SBO on CT scan, but she was clinically a partial SBO. She was treated conservatively for several days with NG tube decompression and bowel rest, and she seemed to improve thus her NG was clamped. On 6/27-28th she seemed to revert backwards and her NG output went up and some of her symptoms returned. It was decided to proceed with surgical intervention. Patient underwent procedure listed above. Tolerated procedure well and was transferred to the floor.  During this hospitalization the patient's antihypertensive medications including verapamil and lisinopril were held. Blood pressure some what low post surgery however during this hospitalization has ranged  mostly between 120-150. We resumed verapamil and lisinopril when able to swallow. In the meantime would recommend controlling blood pressure with hydralazine 10 mg IV every 6 when necessary for systolic blood pressure greater than 160. Patient does not have any history of diabetes. During the hospitalization the patient was found to have hypoglycemic episodes when the patient was N.p.o.on NG tube suction. She did require infusion of D5 normal saline. However now the patient is on TPN and her CBGs have been stable.  She did experience a post-operative ileus associated with N/V with advancement of diet. Her ileus soon resolved. CIR was considered, but she did not qualify and rehab recommended [facility] PT/OT. PT/OT worked with the patient and subsequently recommended [facility] PT/OT. Diet was advanced as tolerated. On POD #9, the patient was voiding well, tolerating diet, ambulating well, pain well controlled, vital signs stable, incisions c/d/i and felt stable for discharge home.    The patient seems to be doing quite well. Her family member states that she has been quite active cooking in the kitchen. Her appetite is improving. She has bowel movements daily. She denies any abdominal pain.  Her abdomen is soft and nontender. Her incision is well-healed with no sign of infection. No drainage.   Status post exploratory laparotomy with small bowel resection and repair of ventral hernia. She seems to be doing quite well. She may resume full activity. Followup when necessary.  Imogene Burn. Georgette Dover, MD, [facility]  General/ Trauma Surgery  05/24/2014 12:31 PM

## 2014-06-05 ENCOUNTER — Telehealth (INDEPENDENT_AMBULATORY_CARE_PROVIDER_SITE_OTHER): Payer: Self-pay

## 2014-06-05 NOTE — Telephone Encounter (Signed)
Home health calling in for pt after going to pt's home for visit. Pt is a s/p exp.laparotomy w/extensive lysis of adhesions primary repair of periumbilical ventral hernia done on 6/28 by Dr Georgette Dover. The pt is c/o of burning in the stomach across the bellybutton line. The pt has no nausea,no vomiting,and no diarrhea. The pt is moving her bowels normal everyday. The pt is c/o of a sour taste in the mouth. The pt has listed on her med's Prevacid but pt is not taking the medication. Do we need to advise pt to take the Prevacid? Please advise.

## 2014-06-06 NOTE — Telephone Encounter (Signed)
LMOM for pt to call back to receive the message below about restarting the Prevacid per Dr Johney [place].

## 2014-06-06 NOTE — Telephone Encounter (Signed)
LMOM for home health nurse to call me back so I can give her the message results after speaking with Dr Johney [place] since Dr Georgette Dover not in the office this week. Per Dr Johney [place] the pt can restart the Prevacid again b/c normally it takes 3-6 wks for the Prevacid to work in your body. The pt can take it daily then increase to BID if needed per Dr Johney [place]. The pt can take Malox, Tums, or Rolaids for breakthrough discomfort per Dr Johney [place]. The pt needs to try to eat small meals like 6-8 meals thru the day instead of 3 regular meals. The pt needs to try and avoid citrus drinks and foods until the medication gets everything under better control then the pt can try with citrus again to see if tolerated. After the pt has tried the Prevacid for several weeks with these recommendations and still having issues then the pt needs to notify her PCP so they could try to switch her medication.

## 2014-06-06 NOTE — Telephone Encounter (Signed)
Juliann Pulse home health nurse called back. I gave her the message below to explain to the pt.

## 2014-06-29 ENCOUNTER — Emergency Department (HOSPITAL_COMMUNITY)
Admission: EM | Admit: 2014-06-29 | Discharge: 2014-06-29 | Disposition: A | Discharge status: Home / Self Care | Payer: PRIVATE HEALTH INSURANCE | Attending: Emergency Medicine | Admitting: Emergency Medicine

## 2014-06-29 ENCOUNTER — Encounter (HOSPITAL_COMMUNITY): Payer: Self-pay | Admitting: Emergency Medicine

## 2014-06-29 ENCOUNTER — Emergency Department (HOSPITAL_COMMUNITY): Payer: PRIVATE HEALTH INSURANCE

## 2014-06-29 ENCOUNTER — Telehealth (INDEPENDENT_AMBULATORY_CARE_PROVIDER_SITE_OTHER): Payer: Self-pay

## 2014-06-29 DIAGNOSIS — K56609 Unspecified intestinal obstruction, unspecified as to partial versus complete obstruction: Secondary | ICD-10-CM | POA: Diagnosis not present

## 2014-06-29 DIAGNOSIS — Z8739 Personal history of other diseases of the musculoskeletal system and connective tissue: Secondary | ICD-10-CM | POA: Insufficient documentation

## 2014-06-29 DIAGNOSIS — R011 Cardiac murmur, unspecified: Secondary | ICD-10-CM | POA: Diagnosis not present

## 2014-06-29 DIAGNOSIS — Z87891 Personal history of nicotine dependence: Secondary | ICD-10-CM | POA: Diagnosis not present

## 2014-06-29 DIAGNOSIS — R1032 Left lower quadrant pain: Secondary | ICD-10-CM | POA: Diagnosis present

## 2014-06-29 DIAGNOSIS — Z3202 Encounter for pregnancy test, result negative: Secondary | ICD-10-CM | POA: Diagnosis not present

## 2014-06-29 DIAGNOSIS — K219 Gastro-esophageal reflux disease without esophagitis: Secondary | ICD-10-CM | POA: Diagnosis not present

## 2014-06-29 DIAGNOSIS — Z79899 Other long term (current) drug therapy: Secondary | ICD-10-CM | POA: Diagnosis not present

## 2014-06-29 DIAGNOSIS — I10 Essential (primary) hypertension: Secondary | ICD-10-CM | POA: Diagnosis not present

## 2014-06-29 DIAGNOSIS — R1110 Vomiting, unspecified: Secondary | ICD-10-CM | POA: Diagnosis not present

## 2014-06-29 DIAGNOSIS — K56600 Partial intestinal obstruction, unspecified as to cause: Secondary | ICD-10-CM

## 2014-06-29 DIAGNOSIS — Z8639 Personal history of other endocrine, nutritional and metabolic disease: Secondary | ICD-10-CM | POA: Insufficient documentation

## 2014-06-29 DIAGNOSIS — Z8659 Personal history of other mental and behavioral disorders: Secondary | ICD-10-CM | POA: Diagnosis not present

## 2014-06-29 DIAGNOSIS — Z8673 Personal history of transient ischemic attack (TIA), and cerebral infarction without residual deficits: Secondary | ICD-10-CM | POA: Diagnosis not present

## 2014-06-29 DIAGNOSIS — Z90710 Acquired absence of both cervix and uterus: Secondary | ICD-10-CM | POA: Insufficient documentation

## 2014-06-29 DIAGNOSIS — Z862 Personal history of diseases of the blood and blood-forming organs and certain disorders involving the immune mechanism: Secondary | ICD-10-CM | POA: Diagnosis not present

## 2014-06-29 DIAGNOSIS — Z7982 Long term (current) use of aspirin: Secondary | ICD-10-CM | POA: Diagnosis not present

## 2014-06-29 LAB — URINALYSIS, ROUTINE W REFLEX MICROSCOPIC
BILIRUBIN URINE: NEGATIVE
GLUCOSE, UA: NEGATIVE mg/dL
HGB URINE DIPSTICK: NEGATIVE
Ketones, ur: NEGATIVE mg/dL
Nitrite: NEGATIVE
Protein, ur: NEGATIVE mg/dL
SPECIFIC GRAVITY, URINE: 1.026 (ref 1.005–1.030)
UROBILINOGEN UA: 1 mg/dL (ref 0.0–1.0)
pH: 7 (ref 5.0–8.0)

## 2014-06-29 LAB — CBC WITH DIFFERENTIAL/PLATELET
BASOS PCT: 0 % (ref 0–1)
Basophils Absolute: 0 10*3/uL (ref 0.0–0.1)
EOS PCT: 1 % (ref 0–5)
Eosinophils Absolute: 0.1 10*3/uL (ref 0.0–0.7)
HEMATOCRIT: 31 % — AB (ref 36.0–46.0)
Hemoglobin: 10.4 g/dL — ABNORMAL LOW (ref 12.0–15.0)
LYMPHS PCT: 75 % — AB (ref 12–46)
Lymphs Abs: 6.4 10*3/uL — ABNORMAL HIGH (ref 0.7–4.0)
MCH: 27.6 pg (ref 26.0–34.0)
MCHC: 33.5 g/dL (ref 30.0–36.0)
MCV: 82.2 fL (ref 78.0–100.0)
MONOS PCT: 4 % (ref 3–12)
Monocytes Absolute: 0.3 10*3/uL (ref 0.1–1.0)
NEUTROS ABS: 1.7 10*3/uL (ref 1.7–7.7)
Neutrophils Relative %: 20 % — ABNORMAL LOW (ref 43–77)
Platelets: 158 10*3/uL (ref 150–400)
RBC: 3.77 MIL/uL — AB (ref 3.87–5.11)
RDW: 13.4 % (ref 11.5–15.5)
WBC: 8.5 10*3/uL (ref 4.0–10.5)

## 2014-06-29 LAB — COMPREHENSIVE METABOLIC PANEL
ALK PHOS: 73 U/L (ref 39–117)
ALT: 9 U/L (ref 0–35)
AST: 15 U/L (ref 0–37)
Albumin: 2.9 g/dL — ABNORMAL LOW (ref 3.5–5.2)
Anion gap: 10 (ref 5–15)
BUN: 16 mg/dL (ref 6–23)
CO2: 23 meq/L (ref 19–32)
Calcium: 8.8 mg/dL (ref 8.4–10.5)
Chloride: 104 mEq/L (ref 96–112)
Creatinine, Ser: 1.15 mg/dL — ABNORMAL HIGH (ref 0.50–1.10)
GFR calc non Af Amer: 43 mL/min — ABNORMAL LOW (ref >90)
GFR, EST AFRICAN AMERICAN: 50 mL/min — AB (ref >90)
Glucose, Bld: 69 mg/dL — ABNORMAL LOW (ref 70–99)
POTASSIUM: 4.8 meq/L (ref 3.7–5.3)
SODIUM: 137 meq/L (ref 137–147)
Total Bilirubin: 0.3 mg/dL (ref 0.3–1.2)
Total Protein: 6.3 g/dL (ref 6.0–8.3)

## 2014-06-29 LAB — URINE MICROSCOPIC-ADD ON

## 2014-06-29 LAB — I-STAT TROPONIN, ED: TROPONIN I, POC: 0 ng/mL (ref 0.00–0.08)

## 2014-06-29 LAB — POC OCCULT BLOOD, ED: Fecal Occult Bld: NEGATIVE

## 2014-06-29 MED ORDER — IOHEXOL 300 MG/ML  SOLN
80.0000 mL | Freq: Once | INTRAMUSCULAR | Status: AC | PRN
  Administered 2014-06-29: 80 mL via INTRAVENOUS

## 2014-06-29 MED ORDER — IOHEXOL 300 MG/ML  SOLN
25.0000 mL | Freq: Once | INTRAMUSCULAR | Status: AC | PRN
  Administered 2014-06-29: 25 mL via ORAL

## 2014-06-29 MED ORDER — GLYCERIN (LAXATIVE) 2 G RE SUPP: 1.0000 | Freq: Once | RECTAL | Status: DC | PRN

## 2014-06-29 NOTE — Discharge Instructions (Signed)
Use glycerin suppository as needed, eat soft diet, and continue with your miralax.    Ileus The intestine (bowel, or gut) is a long, muscular tube connecting your stomach to your rectum. If the intestine stops working, food cannot pass through. This is called an ileus. This can happen for a variety of reasons. Ileus is a major medical problem that usually requires hospitalization. If your intestine stops working because of a blockage, this is called a bowel obstruction and is a different condition. CAUSES   Surgery in your abdomen. This can last from a few hours to a few days.  An infection or inflammation in the belly (abdomen). This includes inflammation of the lining of the abdomen (peritonitis).  Infection or inflammation in other parts of the body, such as pneumonia or pancreatitis.  Passage of gallstones or kidney stones.  Damage to the nerves or blood vessels which go to the bowel.  Imbalance in the salts in the blood (electrolytes).  Injury to the brain and/or spinal cord.  Medications. Many medications can cause ileus or make it worse. The most common of these are strong pain medications. SYMPTOMS  Symptoms of bowel obstruction come from the bowel inactivity. They may include:  Bloating. Your belly gets bigger (distension).  Pain or discomfort in the abdomen.  Poor appetite, feeling sick to your stomach (nausea), and vomiting.  You may also not be able to hear your normal bowel sounds, such as "growling" in your stomach. DIAGNOSIS   Your history and a physical exam will usually suggest to your caregiver that you have an ileus.  X-rays or a CT scan of your abdomen will confirm the diagnosis. X-rays, CT scans, and lab tests may also suggest the cause. TREATMENT   Rest the intestine until it starts working again. This is most often accomplished by:  Stopping intake of oral food and drink. Dehydration is prevented by using IV (intravenous) fluids.  Sometimes, a  nasogastric tube (NG tube) is needed. This is a narrow plastic tube inserted through your nose and into your stomach. It is connected to suction to keep the stomach emptied out. This also helps treat the nausea and vomiting.  If there is an imbalance in the electrolytes, they are corrected with supplements in your intravenous fluids.  Medications that might make an ileus worse might be stopped.  There are no medications that reliably treat ileus, though your caregiver may suggest a trial of certain medications.  If your condition is slow to resolve, you will be reevaluated to be sure another condition, such as a blockage, is not present. Ileus is common and usually has a good outcome. Depending on the cause of your ileus, it usually can be treated by your caregivers with good results. Sometimes, specialists (surgeons or gastroenterologists) are asked to assist in your care.  HOME CARE INSTRUCTIONS   Follow your caregiver's instructions regarding diet and fluid intake. This will usually include drinking plenty of clear fluids, avoiding alcohol and caffeine, and eating a gentle diet.  Follow your caregiver's instructions regarding activity. A period of rest is sometimes advised before returning to work or school.  Take only medications prescribed by your caregiver. Be especially careful with narcotic pain medication, which can slow your bowel activity and contribute to ileus.  Keep any follow-up appointments with your caregiver or specialists. SEEK MEDICAL CARE IF:   You have a recurrence of nausea, vomiting, or abdominal discomfort.  You develop fever of more than 102 F (38.9 C). SEEK IMMEDIATE  MEDICAL CARE IF:   You have severe abdominal pain.  You are unable to keep fluids down. Document Released: 10/22/2003 Document Revised: 03/05/2014 Document Reviewed: 02/21/2009 [facility] Patient Information 2015 [place], [place]. This information is not intended to replace advice given to you by  your health care provider. Make sure you discuss any questions you have with your health care provider.

## 2014-06-29 NOTE — ED Notes (Signed)
Pt. Is aware of needing an urine specimen 

## 2014-06-29 NOTE — Progress Notes (Signed)
OK to D/C. Patient examined and I agree with the assessment and plan  Georganna Skeans, MD, MPH, FACS Trauma: [phone] General Surgery: [phone]  06/29/2014 4:07 PM

## 2014-06-29 NOTE — ED Notes (Signed)
Called Nurse Cecille Rubin To inform her hit urine preg. On machine, but will send edit sheet to [place].

## 2014-06-29 NOTE — Progress Notes (Signed)
Patient ID: Erika Day, female   DOB: 1930-09-26, 80 y.o.   MRN: [id number]    Subjective: Pt is known to [place] for recent abdominal surgery on 04-29-14 for SBO with LOA.  The patient has been doing well.  She c/o some abdominal discomfort that she describes as gas pains over the last few days.  She daily takes meds for gas pain.  She has been passing flatus and stool.  She had one small episode of emesis 3 days ago.  Just some belching since then.  She came to the [facility] this morning for evaluation.  She had a CT scan that questioned maybe a mild ileus or PSBO.  We were asked to see her for further evaluation.  Objective: Vital signs in last 24 hours: Temp:  [98 F (36.7 C)] 98 F (36.7 C) (08/28 1051) Pulse Rate:  [58-79] 69 (08/28 1545) Resp:  [12-32] 18 (08/28 1545) BP: (111-152)/(56-101) 152/59 mmHg (08/28 1545) SpO2:  [91 %-100 %] 100 % (08/28 1545)    Intake/Output from previous day:   Intake/Output this shift:    PE: Abd: soft, Nt, ND, +BS, incision well healed Heart: regular Lungs: CTAB  Lab Results:   Recent Labs  06/29/14 1200  WBC 8.5  HGB 10.4*  HCT 31.0*  PLT 158   BMET  Recent Labs  06/29/14 1200  NA 137  K 4.8  CL 104  CO2 23  GLUCOSE 69*  BUN 16  CREATININE 1.15*  CALCIUM 8.8   PT/INR No results found for this basename: LABPROT, INR,  in the last 72 hours CMP     Component Value Date/Time   NA 137 06/29/2014 1200   K 4.8 06/29/2014 1200   CL 104 06/29/2014 1200   CO2 23 06/29/2014 1200   GLUCOSE 69* 06/29/2014 1200   BUN 16 06/29/2014 1200   CREATININE 1.15* 06/29/2014 1200   CALCIUM 8.8 06/29/2014 1200   PROT 6.3 06/29/2014 1200   ALBUMIN 2.9* 06/29/2014 1200   AST 15 06/29/2014 1200   ALT 9 06/29/2014 1200   ALKPHOS 73 06/29/2014 1200   BILITOT 0.3 06/29/2014 1200   GFRNONAA 43* 06/29/2014 1200   GFRAA 50* 06/29/2014 1200   Lipase     Component Value Date/Time   LIPASE 34 04/23/2014 1052       Studies/Results: Ct Abdomen Pelvis W  Contrast  06/29/2014   CLINICAL DATA:  Lower abdominal pain with nausea and vomiting ; history of previous small bowel obstruction and ventral hernia repair and cholecystectomy ; history of ovarian malignancy and abdominal hysterectomy for same.  EXAM: CT ABDOMEN AND PELVIS WITH CONTRAST  TECHNIQUE: Multidetector CT imaging of the abdomen and pelvis was performed using the standard protocol following bolus administration of intravenous contrast.  CONTRAST:  39mL OMNIPAQUE IOHEXOL 300 MG/ML SOLN intravenously. The patient also received oral contrast material.  COMPARISON:  CT scan of the abdomen and pelvis of April 23, 2014.  FINDINGS: The stomach is partially distended with contrast. The duodenum is unremarkable. There is mild prominence of gas and fluid and contrast within the small bowel without evidence of high-grade obstruction. The colon contains a moderate volume of gas and stool. There is stool in the rectum which may reflect an impaction. There are no findings to suggest enteritis or colitis or diverticulitis. There is no evidence of recurrence of the previously repaired ventral hernia. No discrete omental masses are demonstrated but the density within the mesenteric fat is mildly increased diffusely though not  greatly changed from the previous study. The ascites seen previously is not evident today.  The liver exhibits intrahepatic ductal dilation consistent with post cholecystectomy state. The pancreas is mildly atrophic but grossly normal. The spleen is mildly enlarged and slightly more conspicuous than on the previous study. The caliber of the abdominal aorta is normal. There is mural calcification. The adrenal glands and kidneys exhibit no acute abnormalities.  Within the pelvis the urinary bladder is moderately distended. There is stable soft tissue density material with punctate calcifications in the right aspect of the posterior pelvis. This measures 4.6 cm AP by 4.8 cm transversely by 7.2 cm  longitudinally. There is no ascites. There is no inguinal nor significant umbilical hernia. There is stable prominent inguinal lymph nodes bilaterally.  The lung bases exhibit stable mild fibrotic change medially in the costophrenic gutter on the right. The lumbar spine exhibits degenerative disc change at all levels. The bony pelvis is unremarkable.  IMPRESSION: 1. The small bowel gas pattern may reflect an ileus or partial obstructive pattern but no high-grade obstruction is demonstrated. Increased stool in the rectal vault may reflect a fecal impaction in the appropriate clinical setting. There is no recurrence of the ventral hernia. 2. There is mild splenomegaly new since the previous study. No acute abnormality of the liver or pancreas is demonstrated. No bulky intra-abdominal nor pelvic lymphadenopathy is demonstrated. Stable mildly enlarged inguinal lymph nodes are present. 3. There is persistent soft tissue density in the right aspect of the pelvis posteriorly little changed since the previous study. This may reflect residual ovarian malignancy. 4. The ascites is no longer evident. There remains mildly increased density within the mesenteric fat diffusely.   Electronically Signed   By: David  Martinique   On: 06/29/2014 13:53   Dg Abd Acute W/chest  06/29/2014   CLINICAL DATA:  Abdominal pain vomiting  EXAM: ACUTE ABDOMEN SERIES (ABDOMEN 2 VIEW & CHEST 1 VIEW)  COMPARISON:  05/02/2014, 04/25/2014  FINDINGS: Heart size and vascular pattern are normal. Right hilum appears mildly prominent which is likely due to patient rotation. The lungs are clear.  There are air-fluid levels through small bowel with a few dilated loops of small bowel in the left upper quadrant and left lower quadrant extending into the mid pelvis. There is gas into the colon. There is stool in the rectum causing mild rectal distention.  IMPRESSION: There are mildly dilated loops of small bowel with air-fluid levels suggesting incomplete small  bowel obstruction as the colon is relatively decompressed relative to small bowel.   Electronically Signed   By: Skipper Cliche M.D.   On: 06/29/2014 11:16    Anti-infectives: Anti-infectives   None       Assessment/Plan  1. S/p ex lap with LOA on 04-29-14 2. Abdominal pain, gas 3. Possible stool impaction 4. Mild nausea  Plan: 1. The patient's CT scan has been reviewed by myself as well as Dr. Grandville Silos.  It does not appear clinically or radiographically that she has a bowel obstruction or even really an ileus.  She does appear to have a small possible fecal impaction.  Her pain is gone currently.  She is ok for dc home from the ED.  I have recommended continued daily stool softeners and an OTC glycerin suppository to help with possible small impaction.  I have also recommended clear liquids for the next 24-48 hrs and then advance diet as tolerates.  She and her husband are very grateful and eager to go  home.  They understand this plan and are agreeable.  LOS: 0 days    Daisa Stennis E 06/29/2014, 3:57 PM Pager: [phone]

## 2014-06-29 NOTE — ED Notes (Signed)
Pt c/o lower abd pain with some N/V; pt with hx of SBO and feels similar

## 2014-06-29 NOTE — ED Notes (Signed)
Patient transported to X-ray 

## 2014-06-29 NOTE — ED Provider Notes (Signed)
Patient has history of prior small bowel obstruction. She states 3 days ago she started having discomfort. She states she came he did drink however later she will have vomiting. She's had some decreasing bowel movements. She states last night her nausea was at its worse and her abdomen was very bloated.  Abdomen is soft, it is not hard or rigid. She has some mild diffuse tenderness.  Medical screening examination/treatment/procedure(s) were conducted as a shared visit with non-physician practitioner(s) and myself.  I personally evaluated the patient during the encounter.   EKG Interpretation   Date/Time:  Friday June 29 2014 11:43:49 EDT Ventricular Rate:  56 PR Interval:  170 QRS Duration: 91 QT Interval:  412 QTC Calculation: 398 R Axis:   -6 Text Interpretation:  Sinus rhythm Borderline low voltage, extremity leads  Abnormal R-wave progression, late transition No significant change since  last tracing 23 Apr 2014 Confirmed by Donalee Gaumond  MD-I, Tyonna Talerico ([id number]) on  06/29/2014 12:28:20 PM       Rolland Porter, MD, Alanson Aly, MD 06/29/14 1229

## 2014-06-29 NOTE — ED Provider Notes (Signed)
Medical screening examination/treatment/procedure(s) were performed by non-physician practitioner and as supervising physician I was immediately available for consultation/collaboration.   EKG Interpretation   Date/Time:  Friday June 29 2014 11:43:49 EDT Ventricular Rate:  56 PR Interval:  170 QRS Duration: 91 QT Interval:  412 QTC Calculation: 398 R Axis:   -6 Text Interpretation:  Sinus rhythm Borderline low voltage, extremity leads  Abnormal R-wave progression, late transition No significant change since  last tracing 23 Apr 2014 Confirmed by [place]  MD-I, Rees Matura ([id number]) on  06/29/2014 12:28:20 PM      Rolland Porter, MD, Abram Sander   Janice Norrie, MD 06/29/14 [phone]

## 2014-06-29 NOTE — Telephone Encounter (Signed)
Pt s/p exp lap on 04/29/14 by Dr Georgette Dover. Pts daughter is calling today stating that she has been having n/v and severe pain for several weeks. Daughter states that she has not been able to keep hardly anything down. Daughter states that she had went to see her PCP and the only thing that they had done for her was give her pain meds. Spoke with Dr Georgette Dover he recommends that if she is in that much pain then she needs to go ahead and go to the ER. Daughter verbalized understanding and she states that she will inform her mother of Dr Tsuei's recommendations.

## 2014-06-29 NOTE — ED Notes (Signed)
Surgical PA in assessing pt at this time

## 2014-06-29 NOTE — ED Provider Notes (Signed)
CSN: [id number]     Arrival date & time 06/29/14  1037 History   First MD Initiated Contact with Patient 06/29/14 1059     Chief Complaint  Patient presents with  . Abdominal Pain  . Emesis     (Consider location/radiation/quality/duration/timing/severity/associated sxs/prior Treatment) HPI  80 year old female with prior surgical history of cholecystectomy, abdominal hysterectomy, ventral hernia repair, exploratory laparotomy with lysis of adhesions and prior history of small bowel obstruction presents complaining of abdominal pain and vomiting. Patient reports for the past 3 days she has been having pain primarily to her low abdomen. Pain is 9/10, felt similar to prior small bowel obstruction. She has been feeling nauseous and has vomited once 2 days ago. Vomitus is with yellow fluid. She also report unable to pass flatus for the past few days. She did report having a normal bowel movement this morning. She denies fever, chills, chest pain, shortness of breath, productive cough, dysuria. No known history of cancer. She had a exploratory laparotomy with lysis of adhesion and diagnosis of small bowel obstruction in June 28.     Past Medical History  Diagnosis Date  . Stroke   . Acid reflux disease   . Panic attacks   . Hypertension   . DDD (degenerative disc disease), lumbar   . Dyslipidemia   . Degenerative joint disease    Past Surgical History  Procedure Laterality Date  . Cholecystectomy    . Abdominal hysterectomy    . Laparotomy N/A 04/29/2014    Procedure: EXPLORATORY LAPAROTOMY;  Surgeon: Imogene Burn. Georgette Dover, MD;  Location: [place];  Service: General;  Laterality: N/A;  . Ventral hernia repair N/A 04/29/2014    Procedure: PRIMARY REPAIR OF VENTRAL HERNIA;  Surgeon: Imogene Burn. Georgette Dover, MD;  Location: [place];  Service: General;  Laterality: N/A;  . Lysis of adhesion N/A 04/29/2014    Procedure: EXTENSIVE LYSIS OF ADHESIONS;  Surgeon: Imogene Burn. Georgette Dover, MD;  Location: [place];  Service:  General;  Laterality: N/A;   History reviewed. No pertinent family history. History  Substance Use Topics  . Smoking status: Former Research scientist (life sciences)  . Smokeless tobacco: Not on file  . Alcohol Use: No   OB History   Grav Para Term Preterm Abortions TAB SAB Ect Mult Living                 Review of Systems  Constitutional: Negative for fever.  Respiratory: Negative for shortness of breath.   Cardiovascular: Negative for chest pain.  Gastrointestinal: Positive for abdominal pain.  Neurological: Negative for numbness and headaches.  All other systems reviewed and are negative.     Allergies  Morphine and related  Home Medications   Prior to Admission medications   Medication Sig Start Date End Date Taking? Authorizing Provider  aspirin EC 325 MG tablet Take 325 mg by mouth daily.      Historical Provider, MD  lansoprazole (PREVACID) 15 MG capsule Take 15 mg by mouth daily at 12 noon.    Historical Provider, MD  lisinopril (PRINIVIL,ZESTRIL) 10 MG tablet Take 10 mg by mouth daily.    Historical Provider, MD  LORazepam (ATIVAN) 1 MG tablet Take 0.5 mg by mouth at bedtime as needed for anxiety. As needed for anxiety    Historical Provider, MD  oxymetazoline (AFRIN) 0.05 % nasal spray Place 1-2 sprays into both nostrils 2 (two) times daily as needed for congestion.    Historical Provider, MD  polyethylene glycol (MIRALAX / GLYCOLAX) packet Take 17  g by mouth daily.    Historical Provider, MD  traMADol (ULTRAM) 50 MG tablet Take 1-2 tablets (50-100 mg total) by mouth every 6 (six) hours as needed for moderate pain or severe pain. 05/08/14   Megan Dort, PA-C  verapamil (CALAN-SR) 120 MG CR tablet Take 1 tablet (120 mg total) by mouth daily. 09/10/11 05/24/14  Bonnielee Haff, MD   BP 125/67  Pulse 79  Temp(Src) 98 F (36.7 C) (Oral)  Resp 18  SpO2 96% Physical Exam  Nursing note and vitals reviewed. Constitutional: She appears well-developed and well-nourished. No distress.  HENT:  Head:  Atraumatic.  Eyes: Conjunctivae are normal.  Neck: Neck supple.  Cardiovascular: Normal rate and regular rhythm.   Murmur (Systolic murmur heard) heard. Pulmonary/Chest: Effort normal and breath sounds normal. She has no wheezes. She exhibits no tenderness.  Abdominal: Soft. She exhibits no distension. There is tenderness (Diffuse abdominal tenderness on palpation most significant to left lower quadrant with decreased bowel sounds but no obvious distention and no hernia noted.).  Well-healing midline abdominal surgical scar, nontender to palpation  Genitourinary:  Chaperone present:  Soft stool in rectal vault, no impaction.  No mass, hemoccult negative, normal rectal tone.   Neurological: She is alert.  Skin: No rash noted.  Psychiatric: She has a normal mood and affect.    ED Course  Procedures (including critical care time)  11:33 AM Patient with known history of SBO presents complaining of low abnormal pain, with nausea vomiting similar to prior SBO. Acute abdominal series demonstrated a mildly dilated loops of small bowel with air-fluid level suggesting incomplete small bowel structure and as the colon is relatively decompressed relative to small bowel.    Work up initiated.  Care discussed with Dr. Tomi Bamberger  2:17 PM Abdominal pelvic CT scan demonstrates small bowel gas pattern that may reflect areas of partial obstructive pattern but no high-grade obstruction. There is increased stools in the rectal vault which may reflect fecal impaction however on rectal exam patient has no obvious signs of fecal impaction. Plan to have patient admitted for further management. Patient is aware of plan.  3:01 PM I have consulted with [facility], Dr. Grandville Silos who request [facility] for admission of pt's SBO.  I will consult [facility].    3:13 PM I have consult with [facility], and spoke with Claiborne Billings who is a PA. She agrees to see pt in ER and will determine dispo.    4:05 PM CC has has evaluated the patient  and felt that patient is stable for discharge. Recommend continue with stool softener, over-the-counter suppository, soft diet, And close outpatient followup. Patient voiced understanding and agrees with plan. Patient is stable for discharge.  Labs Review Labs Reviewed  CBC WITH DIFFERENTIAL - Abnormal; Notable for the following:    RBC 3.77 (*)    Hemoglobin 10.4 (*)    HCT 31.0 (*)    Neutrophils Relative % 20 (*)    Lymphocytes Relative 75 (*)    Lymphs Abs 6.4 (*)    All other components within normal limits  COMPREHENSIVE METABOLIC PANEL - Abnormal; Notable for the following:    Glucose, Bld 69 (*)    Creatinine, Ser 1.15 (*)    Albumin 2.9 (*)    GFR calc non Af Amer 43 (*)    GFR calc Af Amer 50 (*)    All other components within normal limits  URINALYSIS, ROUTINE W REFLEX MICROSCOPIC - Abnormal; Notable for the following:    APPearance  CLOUDY (*)    Leukocytes, UA SMALL (*)    All other components within normal limits  URINE MICROSCOPIC-ADD ON  I-STAT TROPOININ, ED  POC OCCULT BLOOD, ED  POC URINE PREG, ED    Imaging Review Ct Abdomen Pelvis W Contrast  06/29/2014   CLINICAL DATA:  Lower abdominal pain with nausea and vomiting ; history of previous small bowel obstruction and ventral hernia repair and cholecystectomy ; history of ovarian malignancy and abdominal hysterectomy for same.  EXAM: CT ABDOMEN AND PELVIS WITH CONTRAST  TECHNIQUE: Multidetector CT imaging of the abdomen and pelvis was performed using the standard protocol following bolus administration of intravenous contrast.  CONTRAST:  48mL OMNIPAQUE IOHEXOL 300 MG/ML SOLN intravenously. The patient also received oral contrast material.  COMPARISON:  CT scan of the abdomen and pelvis of April 23, 2014.  FINDINGS: The stomach is partially distended with contrast. The duodenum is unremarkable. There is mild prominence of gas and fluid and contrast within the small bowel without evidence of high-grade obstruction. The  colon contains a moderate volume of gas and stool. There is stool in the rectum which may reflect an impaction. There are no findings to suggest enteritis or colitis or diverticulitis. There is no evidence of recurrence of the previously repaired ventral hernia. No discrete omental masses are demonstrated but the density within the mesenteric fat is mildly increased diffusely though not greatly changed from the previous study. The ascites seen previously is not evident today.  The liver exhibits intrahepatic ductal dilation consistent with post cholecystectomy state. The pancreas is mildly atrophic but grossly normal. The spleen is mildly enlarged and slightly more conspicuous than on the previous study. The caliber of the abdominal aorta is normal. There is mural calcification. The adrenal glands and kidneys exhibit no acute abnormalities.  Within the pelvis the urinary bladder is moderately distended. There is stable soft tissue density material with punctate calcifications in the right aspect of the posterior pelvis. This measures 4.6 cm AP by 4.8 cm transversely by 7.2 cm longitudinally. There is no ascites. There is no inguinal nor significant umbilical hernia. There is stable prominent inguinal lymph nodes bilaterally.  The lung bases exhibit stable mild fibrotic change medially in the costophrenic gutter on the right. The lumbar spine exhibits degenerative disc change at all levels. The bony pelvis is unremarkable.  IMPRESSION: 1. The small bowel gas pattern may reflect an ileus or partial obstructive pattern but no high-grade obstruction is demonstrated. Increased stool in the rectal vault may reflect a fecal impaction in the appropriate clinical setting. There is no recurrence of the ventral hernia. 2. There is mild splenomegaly new since the previous study. No acute abnormality of the liver or pancreas is demonstrated. No bulky intra-abdominal nor pelvic lymphadenopathy is demonstrated. Stable mildly  enlarged inguinal lymph nodes are present. 3. There is persistent soft tissue density in the right aspect of the pelvis posteriorly little changed since the previous study. This may reflect residual ovarian malignancy. 4. The ascites is no longer evident. There remains mildly increased density within the mesenteric fat diffusely.   Electronically Signed   By: David  Martinique   On: 06/29/2014 13:53   Dg Abd Acute W/chest  06/29/2014   CLINICAL DATA:  Abdominal pain vomiting  EXAM: ACUTE ABDOMEN SERIES (ABDOMEN 2 VIEW & CHEST 1 VIEW)  COMPARISON:  05/02/2014, 04/25/2014  FINDINGS: Heart size and vascular pattern are normal. Right hilum appears mildly prominent which is likely due to patient rotation. The lungs  are clear.  There are air-fluid levels through small bowel with a few dilated loops of small bowel in the left upper quadrant and left lower quadrant extending into the mid pelvis. There is gas into the colon. There is stool in the rectum causing mild rectal distention.  IMPRESSION: There are mildly dilated loops of small bowel with air-fluid levels suggesting incomplete small bowel obstruction as the colon is relatively decompressed relative to small bowel.   Electronically Signed   By: Skipper Cliche M.D.   On: 06/29/2014 11:16     EKG Interpretation   Date/Time:  Friday June 29 2014 11:43:49 EDT Ventricular Rate:  56 PR Interval:  170 QRS Duration: 91 QT Interval:  412 QTC Calculation: 398 R Axis:   -6 Text Interpretation:  Sinus rhythm Borderline low voltage, extremity leads  Abnormal R-wave progression, late transition No significant change since  last tracing 23 Apr 2014 Confirmed by KNAPP  MD-I, IVA ([id number]) on  06/29/2014 12:28:20 PM      MDM   Final diagnoses:  Small bowel obstruction, partial    BP 152/59  Pulse 69  Temp(Src) 98 F (36.7 C) (Oral)  Resp 18  SpO2 100%  I have reviewed nursing notes and vital signs. I personally reviewed the imaging tests through PACS  system  I reviewed available ER/hospitalization records thought the EMR     Domenic Moras, [place] 06/29/14 1608

## 2014-07-02 LAB — POC URINE PREG, ED: Preg Test, Ur: NEGATIVE

## 2014-10-18 ENCOUNTER — Emergency Department (HOSPITAL_COMMUNITY)
Admission: EM | Admit: 2014-10-18 | Discharge: 2014-10-19 | Disposition: A | Discharge status: Home / Self Care | Payer: PRIVATE HEALTH INSURANCE | Attending: Emergency Medicine | Admitting: Emergency Medicine

## 2014-10-18 ENCOUNTER — Encounter (HOSPITAL_COMMUNITY): Payer: Self-pay | Admitting: Emergency Medicine

## 2014-10-18 DIAGNOSIS — K5900 Constipation, unspecified: Secondary | ICD-10-CM | POA: Insufficient documentation

## 2014-10-18 DIAGNOSIS — Z8673 Personal history of transient ischemic attack (TIA), and cerebral infarction without residual deficits: Secondary | ICD-10-CM | POA: Insufficient documentation

## 2014-10-18 DIAGNOSIS — K219 Gastro-esophageal reflux disease without esophagitis: Secondary | ICD-10-CM | POA: Diagnosis not present

## 2014-10-18 DIAGNOSIS — Z87891 Personal history of nicotine dependence: Secondary | ICD-10-CM | POA: Diagnosis not present

## 2014-10-18 DIAGNOSIS — J069 Acute upper respiratory infection, unspecified: Secondary | ICD-10-CM | POA: Insufficient documentation

## 2014-10-18 DIAGNOSIS — Z9049 Acquired absence of other specified parts of digestive tract: Secondary | ICD-10-CM | POA: Insufficient documentation

## 2014-10-18 DIAGNOSIS — Z79899 Other long term (current) drug therapy: Secondary | ICD-10-CM | POA: Insufficient documentation

## 2014-10-18 DIAGNOSIS — Z8639 Personal history of other endocrine, nutritional and metabolic disease: Secondary | ICD-10-CM | POA: Diagnosis not present

## 2014-10-18 DIAGNOSIS — M1990 Unspecified osteoarthritis, unspecified site: Secondary | ICD-10-CM | POA: Diagnosis not present

## 2014-10-18 DIAGNOSIS — Z90710 Acquired absence of both cervix and uterus: Secondary | ICD-10-CM | POA: Insufficient documentation

## 2014-10-18 DIAGNOSIS — F410 Panic disorder [episodic paroxysmal anxiety] without agoraphobia: Secondary | ICD-10-CM | POA: Insufficient documentation

## 2014-10-18 DIAGNOSIS — Z7982 Long term (current) use of aspirin: Secondary | ICD-10-CM | POA: Diagnosis not present

## 2014-10-18 DIAGNOSIS — I10 Essential (primary) hypertension: Secondary | ICD-10-CM | POA: Diagnosis not present

## 2014-10-18 NOTE — ED Notes (Signed)
sts no BM since Monday, no vomiting, also c/o sore throat and cough, no urinary s/s, NAD

## 2014-10-18 NOTE — ED Provider Notes (Signed)
CSN: [id number]     Arrival date & time 10/18/14  2241 History  This chart was scribed for Erika Rice, MD by Chester Holstein, ED Scribe. This patient was seen in room A13C/A13C and the patient's care was started at 11:36 PM.    Chief Complaint  Patient presents with  . Constipation    HPI  HPI Comments: Erika Day is a 80 y.o. female who presents to the Emergency Department complaining of constipation.  Pt notes last normal bowel movement was 4 days ago.   Pt notes she feels like she has to pass the BM and states she is trying to hold it in.  Pt notes mild rectal and abdominal pain from holding it in but states she has tried to go to the bathroom all day with no change.  Pt states she was passing gas before onset.  Pt notes she used an enema with mild relief and a suppository today with no relief. She states it does not feel like a previous blockage.  Past Medical History  Diagnosis Date  . Stroke   . Acid reflux disease   . Panic attacks   . Hypertension   . DDD (degenerative disc disease), lumbar   . Dyslipidemia   . Degenerative joint disease    Past Surgical History  Procedure Laterality Date  . Cholecystectomy    . Abdominal hysterectomy    . Laparotomy N/A 04/29/2014    Procedure: EXPLORATORY LAPAROTOMY;  Surgeon: Imogene Burn. Georgette Dover, MD;  Location: [place];  Service: General;  Laterality: N/A;  . Ventral hernia repair N/A 04/29/2014    Procedure: PRIMARY REPAIR OF VENTRAL HERNIA;  Surgeon: Imogene Burn. Georgette Dover, MD;  Location: [place];  Service: General;  Laterality: N/A;  . Lysis of adhesion N/A 04/29/2014    Procedure: EXTENSIVE LYSIS OF ADHESIONS;  Surgeon: Imogene Burn. Georgette Dover, MD;  Location: [place];  Service: General;  Laterality: N/A;   No family history on file. History  Substance Use Topics  . Smoking status: Former Research scientist (life sciences)  . Smokeless tobacco: Not on file  . Alcohol Use: No   OB History    No data available     Review of Systems  Constitutional: Negative for fever and  chills.  HENT: Positive for sore throat.   Respiratory: Positive for cough. Negative for chest tightness and shortness of breath.   Cardiovascular: Negative for chest pain.  Gastrointestinal: Positive for abdominal pain, constipation and rectal pain. Negative for nausea, vomiting, blood in stool and abdominal distention.  Genitourinary: Negative for dysuria and frequency.  Musculoskeletal: Negative for myalgias, back pain, neck pain and neck stiffness.  Skin: Negative for rash and wound.  Neurological: Negative for dizziness, weakness, light-headedness, numbness and headaches.  All other systems reviewed and are negative.     Allergies  Morphine and related  Home Medications   Prior to Admission medications   Medication Sig Start Date End Date Taking? Authorizing Provider  aspirin EC 325 MG tablet Take 325 mg by mouth daily.      Historical Provider, MD  glycerin adult (GLYCERIN ADULT) 2 G SUPP Place 1 suppository rectally once as needed for moderate constipation. 06/29/14   Domenic Moras, PA-C  lansoprazole (PREVACID) 15 MG capsule Take 15 mg by mouth daily at 12 noon.    Historical Provider, MD  latanoprost (XALATAN) 0.005 % ophthalmic solution Place 1 drop into both eyes at bedtime.    Historical Provider, MD  lisinopril (PRINIVIL,ZESTRIL) 10 MG tablet Take 10 mg  by mouth daily.    Historical Provider, MD  LORazepam (ATIVAN) 1 MG tablet Take 0.5 mg by mouth at bedtime as needed for anxiety. As needed for anxiety    Historical Provider, MD  oxymetazoline (AFRIN) 0.05 % nasal spray Place 1-2 sprays into both nostrils 2 (two) times daily as needed for congestion.    Historical Provider, MD  polyethylene glycol (MIRALAX / GLYCOLAX) packet Take 17 g by mouth daily as needed for mild constipation or moderate constipation.     Historical Provider, MD  traMADol (ULTRAM) 50 MG tablet Take 1-2 tablets (50-100 mg total) by mouth every 6 (six) hours as needed for moderate pain or severe pain. 05/08/14    Megan N Dort, PA-C  verapamil (CALAN-SR) 120 MG CR tablet Take 1 tablet (120 mg total) by mouth daily. 09/10/11 06/29/14  Bonnielee Haff, MD   BP 136/66 mmHg  Pulse 86  Temp(Src) 100.1 F (37.8 C) (Oral)  Resp 20  Ht 5\' 5"  (1.651 m)  Wt 168 lb (76.204 kg)  BMI 27.96 kg/m2  SpO2 98% Physical Exam  Constitutional: She is oriented to person, place, and time. She appears well-developed and well-nourished. No distress.  HENT:  Head: Normocephalic and atraumatic.  Mouth/Throat: Oropharynx is clear and moist. No oropharyngeal exudate.  Eyes: Conjunctivae and EOM are normal. Pupils are equal, round, and reactive to light.  Neck: Normal range of motion. Neck supple.  Cardiovascular: Normal rate and regular rhythm.   Pulmonary/Chest: Effort normal and breath sounds normal. No respiratory distress. She has no wheezes. She has no rales. She exhibits no tenderness.  Abdominal: Soft. Bowel sounds are normal. She exhibits no distension and no mass. There is no tenderness. There is no rebound and no guarding.  Musculoskeletal: Normal range of motion. She exhibits no edema or tenderness.  Lymphadenopathy:    She has no cervical adenopathy.  Neurological: She is alert and oriented to person, place, and time.  Skin: Skin is warm and dry. No rash noted. No erythema.  Psychiatric: She has a normal mood and affect. Her behavior is normal.  Nursing note and vitals reviewed.   ED Course  Procedures (including critical care time) DIAGNOSTIC STUDIES: Oxygen Saturation is 98% on room air, normal by my interpretation.    COORDINATION OF CARE: 11:40 PM Discussed treatment plan with patient at beside, the patient agrees with the plan and has no further questions at this time.   Labs Review Labs Reviewed - No data to display  Imaging Review No results found.   EKG Interpretation None      MDM   Final diagnoses:  None    I personally performed the services described in this documentation,  which was scribed in my presence. The recorded information has been reviewed and is accurate.   Enema given in the emergency department. Passage of small bowel movement. Patient's abdomen is soft. There is no evidence of obstruction on the x-ray. No nausea or vomiting in the ED. Patient had low-grade fever. Also has symptoms consistent with URI. Strep is negative. White blood cell counts are normal. Do not believe antibiotics are necessary at this time time.  Patient has been given return precautions and is voiced understanding. Will follow up with primary M.D. to assure resolution of symptoms.  Erika Rice, MD 10/19/14 [phone]

## 2014-10-19 ENCOUNTER — Emergency Department (HOSPITAL_COMMUNITY): Payer: PRIVATE HEALTH INSURANCE

## 2014-10-19 DIAGNOSIS — K5900 Constipation, unspecified: Secondary | ICD-10-CM | POA: Diagnosis not present

## 2014-10-19 LAB — COMPREHENSIVE METABOLIC PANEL
ALBUMIN: 3.4 g/dL — AB (ref 3.5–5.2)
ALK PHOS: 67 U/L (ref 39–117)
ALT: 5 U/L (ref 0–35)
AST: 12 U/L (ref 0–37)
Anion gap: 13 (ref 5–15)
BUN: 14 mg/dL (ref 6–23)
CALCIUM: 9.7 mg/dL (ref 8.4–10.5)
CO2: 24 mEq/L (ref 19–32)
Chloride: 101 mEq/L (ref 96–112)
Creatinine, Ser: 0.93 mg/dL (ref 0.50–1.10)
GFR calc non Af Amer: 56 mL/min — ABNORMAL LOW (ref >90)
GFR, EST AFRICAN AMERICAN: 65 mL/min — AB (ref >90)
GLUCOSE: 88 mg/dL (ref 70–99)
POTASSIUM: 4.2 meq/L (ref 3.7–5.3)
Sodium: 138 mEq/L (ref 137–147)
Total Bilirubin: 0.4 mg/dL (ref 0.3–1.2)
Total Protein: 7.1 g/dL (ref 6.0–8.3)

## 2014-10-19 LAB — URINALYSIS, ROUTINE W REFLEX MICROSCOPIC
GLUCOSE, UA: NEGATIVE mg/dL
HGB URINE DIPSTICK: NEGATIVE
KETONES UR: NEGATIVE mg/dL
Leukocytes, UA: NEGATIVE
Nitrite: NEGATIVE
PROTEIN: NEGATIVE mg/dL
Specific Gravity, Urine: 1.025 (ref 1.005–1.030)
UROBILINOGEN UA: 1 mg/dL (ref 0.0–1.0)
pH: 6 (ref 5.0–8.0)

## 2014-10-19 LAB — CBC WITH DIFFERENTIAL/PLATELET
BASOS ABS: 0 10*3/uL (ref 0.0–0.1)
Basophils Relative: 0 % (ref 0–1)
EOS ABS: 0 10*3/uL (ref 0.0–0.7)
Eosinophils Relative: 0 % (ref 0–5)
HCT: 34.7 % — ABNORMAL LOW (ref 36.0–46.0)
Hemoglobin: 11 g/dL — ABNORMAL LOW (ref 12.0–15.0)
LYMPHS ABS: 6.9 10*3/uL — AB (ref 0.7–4.0)
Lymphocytes Relative: 66 % — ABNORMAL HIGH (ref 12–46)
MCH: 27.2 pg (ref 26.0–34.0)
MCHC: 31.7 g/dL (ref 30.0–36.0)
MCV: 85.9 fL (ref 78.0–100.0)
MONO ABS: 0.5 10*3/uL (ref 0.1–1.0)
MONOS PCT: 5 % (ref 3–12)
NEUTROS ABS: 3 10*3/uL (ref 1.7–7.7)
Neutrophils Relative %: 29 % — ABNORMAL LOW (ref 43–77)
PLATELETS: 157 10*3/uL (ref 150–400)
RBC: 4.04 MIL/uL (ref 3.87–5.11)
RDW: 14.3 % (ref 11.5–15.5)
WBC: 10.4 10*3/uL (ref 4.0–10.5)

## 2014-10-19 LAB — RAPID STREP SCREEN (MED CTR MEBANE ONLY): Streptococcus, Group A Screen (Direct): NEGATIVE

## 2014-10-19 MED ORDER — LACTULOSE 10 G PO PACK: 10.0000 g | PACK | Freq: Two times a day (BID) | ORAL | Status: DC

## 2014-10-19 MED ORDER — FLEET ENEMA 7-19 GM/118ML RE ENEM
1.0000 | ENEMA | Freq: Once | RECTAL | Status: AC
  Administered 2014-10-19: 1 via RECTAL
  Filled 2014-10-19: qty 1

## 2014-10-19 MED ORDER — DOCUSATE SODIUM 100 MG PO CAPS: 100.0000 mg | ORAL_CAPSULE | Freq: Two times a day (BID) | ORAL | Status: DC

## 2014-10-19 MED ORDER — DICYCLOMINE HCL 10 MG/ML IM SOLN
20.0000 mg | Freq: Once | INTRAMUSCULAR | Status: AC
  Administered 2014-10-19: 20 mg via INTRAMUSCULAR
  Filled 2014-10-19: qty 2

## 2014-10-19 NOTE — Discharge Instructions (Signed)
Constipation °Constipation is when a person has fewer than three bowel movements a week, has difficulty having a bowel movement, or has stools that are dry, hard, or larger than normal. As people grow older, constipation is more common. If you try to fix constipation with medicines that make you have a bowel movement (laxatives), the problem may get worse. Long-term laxative use may cause the muscles of the colon to become weak. A low-fiber diet, not taking in enough fluids, and taking certain medicines may make constipation worse.  °CAUSES  °· Certain medicines, such as antidepressants, pain medicine, iron supplements, antacids, and water pills.   °· Certain diseases, such as diabetes, irritable bowel syndrome (IBS), thyroid disease, or depression.   °· Not drinking enough water.   °· Not eating enough fiber-rich foods.   °· Stress or travel.   °· Lack of physical activity or exercise.   °· Ignoring the urge to have a bowel movement.   °· Using laxatives too much.   °SIGNS AND SYMPTOMS  °· Having fewer than three bowel movements a week.   °· Straining to have a bowel movement.   °· Having stools that are hard, dry, or larger than normal.   °· Feeling full or bloated.   °· Pain in the lower abdomen.   °· Not feeling relief after having a bowel movement.   °DIAGNOSIS  °Your health care provider will take a medical history and perform a physical exam. Further testing may be done for severe constipation. Some tests may include: °· A barium enema X-ray to examine your rectum, colon, and, sometimes, your small intestine.   °· A sigmoidoscopy to examine your lower colon.   °· A colonoscopy to examine your entire colon. °TREATMENT  °Treatment will depend on the severity of your constipation and what is causing it. Some dietary treatments include drinking more fluids and eating more fiber-rich foods. Lifestyle treatments may include regular exercise. If these diet and lifestyle recommendations do not help, your health care  provider may recommend taking over-the-counter laxative medicines to help you have bowel movements. Prescription medicines may be prescribed if over-the-counter medicines do not work.  °HOME CARE INSTRUCTIONS  °· Eat foods that have a lot of fiber, such as fruits, vegetables, whole grains, and beans. °· Limit foods high in fat and processed sugars, such as french fries, hamburgers, cookies, candies, and soda.   °· A fiber supplement may be added to your diet if you cannot get enough fiber from foods.   °· Drink enough fluids to keep your urine clear or pale yellow.   °· Exercise regularly or as directed by your health care provider.   °· Go to the restroom when you have the urge to go. Do not hold it.   °· Only take over-the-counter or prescription medicines as directed by your health care provider. Do not take other medicines for constipation without talking to your health care provider first.   °SEEK IMMEDIATE MEDICAL CARE IF:  °· You have bright red blood in your stool.   °· Your constipation lasts for more than 4 days or gets worse.   °· You have abdominal or rectal pain.   °· You have thin, pencil-like stools.   °· You have unexplained weight loss. °MAKE SURE YOU:  °· Understand these instructions. °· Will watch your condition. °· Will get help right away if you are not doing well or get worse. °Document Released: 07/17/2004 Document Revised: 10/24/2013 Document Reviewed: 07/31/2013 °[employer]® Patient Information ©2015 [employer]. This information is not intended to replace advice given to you by your health care provider. Make sure you discuss any questions   you have with your health care provider.  Upper Respiratory Infection, Adult An upper respiratory infection (URI) is also known as the common cold. It is often caused by a type of germ (virus). Colds are easily spread (contagious). You can pass it to others by kissing, coughing, sneezing, or drinking out of the same glass. Usually, you get better in 1  or 2 weeks.  HOME CARE   Only take medicine as told by your doctor.  Use a warm mist humidifier or breathe in steam from a hot shower.  Drink enough water and fluids to keep your pee (urine) clear or pale yellow.  Get plenty of rest.  Return to work when your temperature is back to normal or as told by your doctor. You may use a face mask and wash your hands to stop your cold from spreading. GET HELP RIGHT AWAY IF:   After the first few days, you feel you are getting worse.  You have questions about your medicine.  You have chills, shortness of breath, or brown or red spit (mucus).  You have yellow or brown snot (nasal discharge) or pain in the face, especially when you bend forward.  You have a fever, puffy (swollen) neck, pain when you swallow, or white spots in the back of your throat.  You have a bad headache, ear pain, sinus pain, or chest pain.  You have a high-pitched whistling sound when you breathe in and out (wheezing).  You have a lasting cough or cough up blood.  You have sore muscles or a stiff neck. MAKE SURE YOU:   Understand these instructions.  Will watch your condition.  Will get help right away if you are not doing well or get worse. Document Released: 04/06/2008 Document Revised: 01/11/2012 Document Reviewed: 01/24/2014 [facility] Patient Information 2015 [place], [place]. This information is not intended to replace advice given to you by your health care provider. Make sure you discuss any questions you have with your health care provider.

## 2014-10-21 LAB — CULTURE, GROUP A STREP

## 2014-11-02 DIAGNOSIS — N183 Chronic kidney disease, stage 3 (moderate): Secondary | ICD-10-CM | POA: Diagnosis not present

## 2014-11-03 DIAGNOSIS — N183 Chronic kidney disease, stage 3 (moderate): Secondary | ICD-10-CM | POA: Diagnosis not present

## 2014-11-04 DIAGNOSIS — N183 Chronic kidney disease, stage 3 (moderate): Secondary | ICD-10-CM | POA: Diagnosis not present

## 2014-11-05 DIAGNOSIS — H25812 Combined forms of age-related cataract, left eye: Secondary | ICD-10-CM | POA: Diagnosis not present

## 2014-11-05 DIAGNOSIS — H4011X0 Primary open-angle glaucoma, stage unspecified: Secondary | ICD-10-CM | POA: Diagnosis not present

## 2014-11-05 DIAGNOSIS — H10413 Chronic giant papillary conjunctivitis, bilateral: Secondary | ICD-10-CM | POA: Diagnosis not present

## 2014-11-05 DIAGNOSIS — Z961 Presence of intraocular lens: Secondary | ICD-10-CM | POA: Diagnosis not present

## 2014-11-05 DIAGNOSIS — N183 Chronic kidney disease, stage 3 (moderate): Secondary | ICD-10-CM | POA: Diagnosis not present

## 2014-11-05 DIAGNOSIS — H1851 Endothelial corneal dystrophy: Secondary | ICD-10-CM | POA: Diagnosis not present

## 2014-11-06 DIAGNOSIS — N183 Chronic kidney disease, stage 3 (moderate): Secondary | ICD-10-CM | POA: Diagnosis not present

## 2014-11-07 DIAGNOSIS — N183 Chronic kidney disease, stage 3 (moderate): Secondary | ICD-10-CM | POA: Diagnosis not present

## 2014-11-08 DIAGNOSIS — N183 Chronic kidney disease, stage 3 (moderate): Secondary | ICD-10-CM | POA: Diagnosis not present

## 2014-11-08 DIAGNOSIS — C569 Malignant neoplasm of unspecified ovary: Secondary | ICD-10-CM | POA: Diagnosis not present

## 2014-11-09 DIAGNOSIS — N183 Chronic kidney disease, stage 3 (moderate): Secondary | ICD-10-CM | POA: Diagnosis not present

## 2014-11-10 DIAGNOSIS — N183 Chronic kidney disease, stage 3 (moderate): Secondary | ICD-10-CM | POA: Diagnosis not present

## 2014-11-11 DIAGNOSIS — N183 Chronic kidney disease, stage 3 (moderate): Secondary | ICD-10-CM | POA: Diagnosis not present

## 2014-11-12 DIAGNOSIS — N183 Chronic kidney disease, stage 3 (moderate): Secondary | ICD-10-CM | POA: Diagnosis not present

## 2014-11-13 DIAGNOSIS — N183 Chronic kidney disease, stage 3 (moderate): Secondary | ICD-10-CM | POA: Diagnosis not present

## 2014-11-14 DIAGNOSIS — N183 Chronic kidney disease, stage 3 (moderate): Secondary | ICD-10-CM | POA: Diagnosis not present

## 2014-11-15 DIAGNOSIS — N183 Chronic kidney disease, stage 3 (moderate): Secondary | ICD-10-CM | POA: Diagnosis not present

## 2014-11-16 DIAGNOSIS — N183 Chronic kidney disease, stage 3 (moderate): Secondary | ICD-10-CM | POA: Diagnosis not present

## 2014-11-17 DIAGNOSIS — N183 Chronic kidney disease, stage 3 (moderate): Secondary | ICD-10-CM | POA: Diagnosis not present

## 2014-11-18 DIAGNOSIS — N183 Chronic kidney disease, stage 3 (moderate): Secondary | ICD-10-CM | POA: Diagnosis not present

## 2014-11-19 DIAGNOSIS — N183 Chronic kidney disease, stage 3 (moderate): Secondary | ICD-10-CM | POA: Diagnosis not present

## 2014-11-20 DIAGNOSIS — N183 Chronic kidney disease, stage 3 (moderate): Secondary | ICD-10-CM | POA: Diagnosis not present

## 2014-11-21 DIAGNOSIS — N183 Chronic kidney disease, stage 3 (moderate): Secondary | ICD-10-CM | POA: Diagnosis not present

## 2014-11-22 DIAGNOSIS — N183 Chronic kidney disease, stage 3 (moderate): Secondary | ICD-10-CM | POA: Diagnosis not present

## 2014-11-23 DIAGNOSIS — N183 Chronic kidney disease, stage 3 (moderate): Secondary | ICD-10-CM | POA: Diagnosis not present

## 2014-11-24 DIAGNOSIS — N183 Chronic kidney disease, stage 3 (moderate): Secondary | ICD-10-CM | POA: Diagnosis not present

## 2014-11-25 DIAGNOSIS — N183 Chronic kidney disease, stage 3 (moderate): Secondary | ICD-10-CM | POA: Diagnosis not present

## 2014-11-26 DIAGNOSIS — N183 Chronic kidney disease, stage 3 (moderate): Secondary | ICD-10-CM | POA: Diagnosis not present

## 2014-11-27 DIAGNOSIS — N183 Chronic kidney disease, stage 3 (moderate): Secondary | ICD-10-CM | POA: Diagnosis not present

## 2014-11-28 DIAGNOSIS — N183 Chronic kidney disease, stage 3 (moderate): Secondary | ICD-10-CM | POA: Diagnosis not present

## 2014-11-29 DIAGNOSIS — N183 Chronic kidney disease, stage 3 (moderate): Secondary | ICD-10-CM | POA: Diagnosis not present

## 2014-11-30 DIAGNOSIS — N183 Chronic kidney disease, stage 3 (moderate): Secondary | ICD-10-CM | POA: Diagnosis not present

## 2014-12-01 DIAGNOSIS — N183 Chronic kidney disease, stage 3 (moderate): Secondary | ICD-10-CM | POA: Diagnosis not present

## 2014-12-02 DIAGNOSIS — N183 Chronic kidney disease, stage 3 (moderate): Secondary | ICD-10-CM | POA: Diagnosis not present

## 2014-12-03 DIAGNOSIS — N183 Chronic kidney disease, stage 3 (moderate): Secondary | ICD-10-CM | POA: Diagnosis not present

## 2014-12-03 DIAGNOSIS — C569 Malignant neoplasm of unspecified ovary: Secondary | ICD-10-CM | POA: Diagnosis not present

## 2014-12-04 DIAGNOSIS — N183 Chronic kidney disease, stage 3 (moderate): Secondary | ICD-10-CM | POA: Diagnosis not present

## 2014-12-05 DIAGNOSIS — N183 Chronic kidney disease, stage 3 (moderate): Secondary | ICD-10-CM | POA: Diagnosis not present

## 2014-12-06 DIAGNOSIS — N183 Chronic kidney disease, stage 3 (moderate): Secondary | ICD-10-CM | POA: Diagnosis not present

## 2014-12-07 DIAGNOSIS — N183 Chronic kidney disease, stage 3 (moderate): Secondary | ICD-10-CM | POA: Diagnosis not present

## 2014-12-08 DIAGNOSIS — N183 Chronic kidney disease, stage 3 (moderate): Secondary | ICD-10-CM | POA: Diagnosis not present

## 2014-12-09 DIAGNOSIS — C569 Malignant neoplasm of unspecified ovary: Secondary | ICD-10-CM | POA: Diagnosis not present

## 2014-12-09 DIAGNOSIS — N183 Chronic kidney disease, stage 3 (moderate): Secondary | ICD-10-CM | POA: Diagnosis not present

## 2014-12-10 DIAGNOSIS — N183 Chronic kidney disease, stage 3 (moderate): Secondary | ICD-10-CM | POA: Diagnosis not present

## 2014-12-11 DIAGNOSIS — N183 Chronic kidney disease, stage 3 (moderate): Secondary | ICD-10-CM | POA: Diagnosis not present

## 2014-12-12 DIAGNOSIS — N183 Chronic kidney disease, stage 3 (moderate): Secondary | ICD-10-CM | POA: Diagnosis not present

## 2014-12-13 DIAGNOSIS — N183 Chronic kidney disease, stage 3 (moderate): Secondary | ICD-10-CM | POA: Diagnosis not present

## 2014-12-14 DIAGNOSIS — N183 Chronic kidney disease, stage 3 (moderate): Secondary | ICD-10-CM | POA: Diagnosis not present

## 2014-12-15 DIAGNOSIS — N183 Chronic kidney disease, stage 3 (moderate): Secondary | ICD-10-CM | POA: Diagnosis not present

## 2014-12-16 DIAGNOSIS — N183 Chronic kidney disease, stage 3 (moderate): Secondary | ICD-10-CM | POA: Diagnosis not present

## 2014-12-17 DIAGNOSIS — N183 Chronic kidney disease, stage 3 (moderate): Secondary | ICD-10-CM | POA: Diagnosis not present

## 2014-12-18 DIAGNOSIS — N183 Chronic kidney disease, stage 3 (moderate): Secondary | ICD-10-CM | POA: Diagnosis not present

## 2014-12-19 DIAGNOSIS — N183 Chronic kidney disease, stage 3 (moderate): Secondary | ICD-10-CM | POA: Diagnosis not present

## 2014-12-20 DIAGNOSIS — M179 Osteoarthritis of knee, unspecified: Secondary | ICD-10-CM | POA: Diagnosis not present

## 2014-12-20 DIAGNOSIS — E1121 Type 2 diabetes mellitus with diabetic nephropathy: Secondary | ICD-10-CM | POA: Diagnosis not present

## 2014-12-20 DIAGNOSIS — I10 Essential (primary) hypertension: Secondary | ICD-10-CM | POA: Diagnosis not present

## 2014-12-20 DIAGNOSIS — K219 Gastro-esophageal reflux disease without esophagitis: Secondary | ICD-10-CM | POA: Diagnosis not present

## 2014-12-20 DIAGNOSIS — N183 Chronic kidney disease, stage 3 (moderate): Secondary | ICD-10-CM | POA: Diagnosis not present

## 2014-12-21 DIAGNOSIS — N183 Chronic kidney disease, stage 3 (moderate): Secondary | ICD-10-CM | POA: Diagnosis not present

## 2014-12-22 DIAGNOSIS — N183 Chronic kidney disease, stage 3 (moderate): Secondary | ICD-10-CM | POA: Diagnosis not present

## 2014-12-23 DIAGNOSIS — N183 Chronic kidney disease, stage 3 (moderate): Secondary | ICD-10-CM | POA: Diagnosis not present

## 2014-12-24 DIAGNOSIS — N183 Chronic kidney disease, stage 3 (moderate): Secondary | ICD-10-CM | POA: Diagnosis not present

## 2014-12-25 DIAGNOSIS — N183 Chronic kidney disease, stage 3 (moderate): Secondary | ICD-10-CM | POA: Diagnosis not present

## 2014-12-26 DIAGNOSIS — N183 Chronic kidney disease, stage 3 (moderate): Secondary | ICD-10-CM | POA: Diagnosis not present

## 2014-12-27 DIAGNOSIS — N183 Chronic kidney disease, stage 3 (moderate): Secondary | ICD-10-CM | POA: Diagnosis not present

## 2014-12-28 DIAGNOSIS — N183 Chronic kidney disease, stage 3 (moderate): Secondary | ICD-10-CM | POA: Diagnosis not present

## 2014-12-29 DIAGNOSIS — N183 Chronic kidney disease, stage 3 (moderate): Secondary | ICD-10-CM | POA: Diagnosis not present

## 2014-12-30 DIAGNOSIS — N183 Chronic kidney disease, stage 3 (moderate): Secondary | ICD-10-CM | POA: Diagnosis not present

## 2014-12-31 DIAGNOSIS — H10413 Chronic giant papillary conjunctivitis, bilateral: Secondary | ICD-10-CM | POA: Diagnosis not present

## 2014-12-31 DIAGNOSIS — N183 Chronic kidney disease, stage 3 (moderate): Secondary | ICD-10-CM | POA: Diagnosis not present

## 2014-12-31 DIAGNOSIS — H4011X2 Primary open-angle glaucoma, moderate stage: Secondary | ICD-10-CM | POA: Diagnosis not present

## 2014-12-31 DIAGNOSIS — H25812 Combined forms of age-related cataract, left eye: Secondary | ICD-10-CM | POA: Diagnosis not present

## 2014-12-31 DIAGNOSIS — Z961 Presence of intraocular lens: Secondary | ICD-10-CM | POA: Diagnosis not present

## 2014-12-31 DIAGNOSIS — H1851 Endothelial corneal dystrophy: Secondary | ICD-10-CM | POA: Diagnosis not present

## 2015-01-01 DIAGNOSIS — N183 Chronic kidney disease, stage 3 (moderate): Secondary | ICD-10-CM | POA: Diagnosis not present

## 2015-01-02 DIAGNOSIS — N183 Chronic kidney disease, stage 3 (moderate): Secondary | ICD-10-CM | POA: Diagnosis not present

## 2015-01-03 DIAGNOSIS — N183 Chronic kidney disease, stage 3 (moderate): Secondary | ICD-10-CM | POA: Diagnosis not present

## 2015-01-04 DIAGNOSIS — N183 Chronic kidney disease, stage 3 (moderate): Secondary | ICD-10-CM | POA: Diagnosis not present

## 2015-01-05 DIAGNOSIS — N183 Chronic kidney disease, stage 3 (moderate): Secondary | ICD-10-CM | POA: Diagnosis not present

## 2015-01-06 DIAGNOSIS — N183 Chronic kidney disease, stage 3 (moderate): Secondary | ICD-10-CM | POA: Diagnosis not present

## 2015-01-07 DIAGNOSIS — C569 Malignant neoplasm of unspecified ovary: Secondary | ICD-10-CM | POA: Diagnosis not present

## 2015-01-07 DIAGNOSIS — N183 Chronic kidney disease, stage 3 (moderate): Secondary | ICD-10-CM | POA: Diagnosis not present

## 2015-01-08 DIAGNOSIS — N183 Chronic kidney disease, stage 3 (moderate): Secondary | ICD-10-CM | POA: Diagnosis not present

## 2015-01-09 DIAGNOSIS — N183 Chronic kidney disease, stage 3 (moderate): Secondary | ICD-10-CM | POA: Diagnosis not present

## 2015-01-10 DIAGNOSIS — N183 Chronic kidney disease, stage 3 (moderate): Secondary | ICD-10-CM | POA: Diagnosis not present

## 2015-01-11 DIAGNOSIS — N183 Chronic kidney disease, stage 3 (moderate): Secondary | ICD-10-CM | POA: Diagnosis not present

## 2015-01-12 DIAGNOSIS — N183 Chronic kidney disease, stage 3 (moderate): Secondary | ICD-10-CM | POA: Diagnosis not present

## 2015-01-13 DIAGNOSIS — N183 Chronic kidney disease, stage 3 (moderate): Secondary | ICD-10-CM | POA: Diagnosis not present

## 2015-01-14 DIAGNOSIS — N183 Chronic kidney disease, stage 3 (moderate): Secondary | ICD-10-CM | POA: Diagnosis not present

## 2015-01-15 DIAGNOSIS — N183 Chronic kidney disease, stage 3 (moderate): Secondary | ICD-10-CM | POA: Diagnosis not present

## 2015-01-16 DIAGNOSIS — N183 Chronic kidney disease, stage 3 (moderate): Secondary | ICD-10-CM | POA: Diagnosis not present

## 2015-01-17 DIAGNOSIS — N183 Chronic kidney disease, stage 3 (moderate): Secondary | ICD-10-CM | POA: Diagnosis not present

## 2015-01-18 DIAGNOSIS — N183 Chronic kidney disease, stage 3 (moderate): Secondary | ICD-10-CM | POA: Diagnosis not present

## 2015-01-19 DIAGNOSIS — N183 Chronic kidney disease, stage 3 (moderate): Secondary | ICD-10-CM | POA: Diagnosis not present

## 2015-01-20 DIAGNOSIS — N183 Chronic kidney disease, stage 3 (moderate): Secondary | ICD-10-CM | POA: Diagnosis not present

## 2015-01-21 DIAGNOSIS — N183 Chronic kidney disease, stage 3 (moderate): Secondary | ICD-10-CM | POA: Diagnosis not present

## 2015-01-22 DIAGNOSIS — N183 Chronic kidney disease, stage 3 (moderate): Secondary | ICD-10-CM | POA: Diagnosis not present

## 2015-01-23 DIAGNOSIS — N183 Chronic kidney disease, stage 3 (moderate): Secondary | ICD-10-CM | POA: Diagnosis not present

## 2015-01-24 DIAGNOSIS — N183 Chronic kidney disease, stage 3 (moderate): Secondary | ICD-10-CM | POA: Diagnosis not present

## 2015-01-25 DIAGNOSIS — N183 Chronic kidney disease, stage 3 (moderate): Secondary | ICD-10-CM | POA: Diagnosis not present

## 2015-01-26 DIAGNOSIS — N183 Chronic kidney disease, stage 3 (moderate): Secondary | ICD-10-CM | POA: Diagnosis not present

## 2015-01-27 DIAGNOSIS — N183 Chronic kidney disease, stage 3 (moderate): Secondary | ICD-10-CM | POA: Diagnosis not present

## 2015-01-28 DIAGNOSIS — N183 Chronic kidney disease, stage 3 (moderate): Secondary | ICD-10-CM | POA: Diagnosis not present

## 2015-01-29 DIAGNOSIS — N183 Chronic kidney disease, stage 3 (moderate): Secondary | ICD-10-CM | POA: Diagnosis not present

## 2015-01-30 DIAGNOSIS — N183 Chronic kidney disease, stage 3 (moderate): Secondary | ICD-10-CM | POA: Diagnosis not present

## 2015-01-31 DIAGNOSIS — N183 Chronic kidney disease, stage 3 (moderate): Secondary | ICD-10-CM | POA: Diagnosis not present

## 2015-02-01 DIAGNOSIS — N183 Chronic kidney disease, stage 3 (moderate): Secondary | ICD-10-CM | POA: Diagnosis not present

## 2015-02-02 DIAGNOSIS — N183 Chronic kidney disease, stage 3 (moderate): Secondary | ICD-10-CM | POA: Diagnosis not present

## 2015-02-03 DIAGNOSIS — N183 Chronic kidney disease, stage 3 (moderate): Secondary | ICD-10-CM | POA: Diagnosis not present

## 2015-02-04 DIAGNOSIS — N183 Chronic kidney disease, stage 3 (moderate): Secondary | ICD-10-CM | POA: Diagnosis not present

## 2015-02-05 DIAGNOSIS — N183 Chronic kidney disease, stage 3 (moderate): Secondary | ICD-10-CM | POA: Diagnosis not present

## 2015-02-06 DIAGNOSIS — N183 Chronic kidney disease, stage 3 (moderate): Secondary | ICD-10-CM | POA: Diagnosis not present

## 2015-02-07 DIAGNOSIS — C569 Malignant neoplasm of unspecified ovary: Secondary | ICD-10-CM | POA: Diagnosis not present

## 2015-02-07 DIAGNOSIS — N183 Chronic kidney disease, stage 3 (moderate): Secondary | ICD-10-CM | POA: Diagnosis not present

## 2015-02-08 DIAGNOSIS — N183 Chronic kidney disease, stage 3 (moderate): Secondary | ICD-10-CM | POA: Diagnosis not present

## 2015-02-09 DIAGNOSIS — N183 Chronic kidney disease, stage 3 (moderate): Secondary | ICD-10-CM | POA: Diagnosis not present

## 2015-02-10 DIAGNOSIS — N183 Chronic kidney disease, stage 3 (moderate): Secondary | ICD-10-CM | POA: Diagnosis not present

## 2015-02-11 DIAGNOSIS — H1851 Endothelial corneal dystrophy: Secondary | ICD-10-CM | POA: Diagnosis not present

## 2015-02-11 DIAGNOSIS — Z961 Presence of intraocular lens: Secondary | ICD-10-CM | POA: Diagnosis not present

## 2015-02-11 DIAGNOSIS — H10413 Chronic giant papillary conjunctivitis, bilateral: Secondary | ICD-10-CM | POA: Diagnosis not present

## 2015-02-11 DIAGNOSIS — H25812 Combined forms of age-related cataract, left eye: Secondary | ICD-10-CM | POA: Diagnosis not present

## 2015-02-11 DIAGNOSIS — N183 Chronic kidney disease, stage 3 (moderate): Secondary | ICD-10-CM | POA: Diagnosis not present

## 2015-02-11 DIAGNOSIS — H4011X0 Primary open-angle glaucoma, stage unspecified: Secondary | ICD-10-CM | POA: Diagnosis not present

## 2015-02-12 DIAGNOSIS — N183 Chronic kidney disease, stage 3 (moderate): Secondary | ICD-10-CM | POA: Diagnosis not present

## 2015-02-13 DIAGNOSIS — N183 Chronic kidney disease, stage 3 (moderate): Secondary | ICD-10-CM | POA: Diagnosis not present

## 2015-02-14 DIAGNOSIS — N183 Chronic kidney disease, stage 3 (moderate): Secondary | ICD-10-CM | POA: Diagnosis not present

## 2015-02-15 DIAGNOSIS — N183 Chronic kidney disease, stage 3 (moderate): Secondary | ICD-10-CM | POA: Diagnosis not present

## 2015-02-16 DIAGNOSIS — N183 Chronic kidney disease, stage 3 (moderate): Secondary | ICD-10-CM | POA: Diagnosis not present

## 2015-02-17 DIAGNOSIS — N183 Chronic kidney disease, stage 3 (moderate): Secondary | ICD-10-CM | POA: Diagnosis not present

## 2015-02-18 DIAGNOSIS — N183 Chronic kidney disease, stage 3 (moderate): Secondary | ICD-10-CM | POA: Diagnosis not present

## 2015-02-19 DIAGNOSIS — N183 Chronic kidney disease, stage 3 (moderate): Secondary | ICD-10-CM | POA: Diagnosis not present

## 2015-02-20 DIAGNOSIS — N183 Chronic kidney disease, stage 3 (moderate): Secondary | ICD-10-CM | POA: Diagnosis not present

## 2015-02-21 DIAGNOSIS — N183 Chronic kidney disease, stage 3 (moderate): Secondary | ICD-10-CM | POA: Diagnosis not present

## 2015-02-22 DIAGNOSIS — N183 Chronic kidney disease, stage 3 (moderate): Secondary | ICD-10-CM | POA: Diagnosis not present

## 2015-02-23 DIAGNOSIS — N183 Chronic kidney disease, stage 3 (moderate): Secondary | ICD-10-CM | POA: Diagnosis not present

## 2015-02-24 DIAGNOSIS — N183 Chronic kidney disease, stage 3 (moderate): Secondary | ICD-10-CM | POA: Diagnosis not present

## 2015-02-25 DIAGNOSIS — N183 Chronic kidney disease, stage 3 (moderate): Secondary | ICD-10-CM | POA: Diagnosis not present

## 2015-02-26 DIAGNOSIS — N183 Chronic kidney disease, stage 3 (moderate): Secondary | ICD-10-CM | POA: Diagnosis not present

## 2015-02-27 DIAGNOSIS — N183 Chronic kidney disease, stage 3 (moderate): Secondary | ICD-10-CM | POA: Diagnosis not present

## 2015-02-28 DIAGNOSIS — N183 Chronic kidney disease, stage 3 (moderate): Secondary | ICD-10-CM | POA: Diagnosis not present

## 2015-03-01 DIAGNOSIS — N183 Chronic kidney disease, stage 3 (moderate): Secondary | ICD-10-CM | POA: Diagnosis not present

## 2015-03-02 DIAGNOSIS — N183 Chronic kidney disease, stage 3 (moderate): Secondary | ICD-10-CM | POA: Diagnosis not present

## 2015-03-03 DIAGNOSIS — N183 Chronic kidney disease, stage 3 (moderate): Secondary | ICD-10-CM | POA: Diagnosis not present

## 2015-03-04 DIAGNOSIS — N183 Chronic kidney disease, stage 3 (moderate): Secondary | ICD-10-CM | POA: Diagnosis not present

## 2015-03-05 DIAGNOSIS — N183 Chronic kidney disease, stage 3 (moderate): Secondary | ICD-10-CM | POA: Diagnosis not present

## 2015-03-06 DIAGNOSIS — N183 Chronic kidney disease, stage 3 (moderate): Secondary | ICD-10-CM | POA: Diagnosis not present

## 2015-03-07 DIAGNOSIS — N183 Chronic kidney disease, stage 3 (moderate): Secondary | ICD-10-CM | POA: Diagnosis not present

## 2015-03-08 DIAGNOSIS — N183 Chronic kidney disease, stage 3 (moderate): Secondary | ICD-10-CM | POA: Diagnosis not present

## 2015-03-09 DIAGNOSIS — N183 Chronic kidney disease, stage 3 (moderate): Secondary | ICD-10-CM | POA: Diagnosis not present

## 2015-03-10 DIAGNOSIS — N183 Chronic kidney disease, stage 3 (moderate): Secondary | ICD-10-CM | POA: Diagnosis not present

## 2015-03-11 DIAGNOSIS — N183 Chronic kidney disease, stage 3 (moderate): Secondary | ICD-10-CM | POA: Diagnosis not present

## 2015-03-12 DIAGNOSIS — N183 Chronic kidney disease, stage 3 (moderate): Secondary | ICD-10-CM | POA: Diagnosis not present

## 2015-03-13 DIAGNOSIS — N183 Chronic kidney disease, stage 3 (moderate): Secondary | ICD-10-CM | POA: Diagnosis not present

## 2015-03-14 DIAGNOSIS — N183 Chronic kidney disease, stage 3 (moderate): Secondary | ICD-10-CM | POA: Diagnosis not present

## 2015-03-15 DIAGNOSIS — N183 Chronic kidney disease, stage 3 (moderate): Secondary | ICD-10-CM | POA: Diagnosis not present

## 2015-03-16 DIAGNOSIS — N183 Chronic kidney disease, stage 3 (moderate): Secondary | ICD-10-CM | POA: Diagnosis not present

## 2015-03-17 DIAGNOSIS — N183 Chronic kidney disease, stage 3 (moderate): Secondary | ICD-10-CM | POA: Diagnosis not present

## 2015-03-18 DIAGNOSIS — N183 Chronic kidney disease, stage 3 (moderate): Secondary | ICD-10-CM | POA: Diagnosis not present

## 2015-03-19 DIAGNOSIS — M179 Osteoarthritis of knee, unspecified: Secondary | ICD-10-CM | POA: Diagnosis not present

## 2015-03-19 DIAGNOSIS — R600 Localized edema: Secondary | ICD-10-CM | POA: Diagnosis not present

## 2015-03-19 DIAGNOSIS — E1121 Type 2 diabetes mellitus with diabetic nephropathy: Secondary | ICD-10-CM | POA: Diagnosis not present

## 2015-03-19 DIAGNOSIS — N183 Chronic kidney disease, stage 3 (moderate): Secondary | ICD-10-CM | POA: Diagnosis not present

## 2015-03-19 DIAGNOSIS — K219 Gastro-esophageal reflux disease without esophagitis: Secondary | ICD-10-CM | POA: Diagnosis not present

## 2015-03-19 DIAGNOSIS — I10 Essential (primary) hypertension: Secondary | ICD-10-CM | POA: Diagnosis not present

## 2015-03-20 DIAGNOSIS — N183 Chronic kidney disease, stage 3 (moderate): Secondary | ICD-10-CM | POA: Diagnosis not present

## 2015-03-21 DIAGNOSIS — N183 Chronic kidney disease, stage 3 (moderate): Secondary | ICD-10-CM | POA: Diagnosis not present

## 2015-03-22 DIAGNOSIS — N183 Chronic kidney disease, stage 3 (moderate): Secondary | ICD-10-CM | POA: Diagnosis not present

## 2015-03-23 DIAGNOSIS — N183 Chronic kidney disease, stage 3 (moderate): Secondary | ICD-10-CM | POA: Diagnosis not present

## 2015-03-24 DIAGNOSIS — N183 Chronic kidney disease, stage 3 (moderate): Secondary | ICD-10-CM | POA: Diagnosis not present

## 2015-03-25 DIAGNOSIS — N183 Chronic kidney disease, stage 3 (moderate): Secondary | ICD-10-CM | POA: Diagnosis not present

## 2015-03-26 DIAGNOSIS — N183 Chronic kidney disease, stage 3 (moderate): Secondary | ICD-10-CM | POA: Diagnosis not present

## 2015-03-27 DIAGNOSIS — N183 Chronic kidney disease, stage 3 (moderate): Secondary | ICD-10-CM | POA: Diagnosis not present

## 2015-03-28 DIAGNOSIS — N183 Chronic kidney disease, stage 3 (moderate): Secondary | ICD-10-CM | POA: Diagnosis not present

## 2015-03-29 DIAGNOSIS — N183 Chronic kidney disease, stage 3 (moderate): Secondary | ICD-10-CM | POA: Diagnosis not present

## 2015-03-30 DIAGNOSIS — N183 Chronic kidney disease, stage 3 (moderate): Secondary | ICD-10-CM | POA: Diagnosis not present

## 2015-03-31 DIAGNOSIS — N183 Chronic kidney disease, stage 3 (moderate): Secondary | ICD-10-CM | POA: Diagnosis not present

## 2015-04-01 DIAGNOSIS — N183 Chronic kidney disease, stage 3 (moderate): Secondary | ICD-10-CM | POA: Diagnosis not present

## 2015-04-02 DIAGNOSIS — N183 Chronic kidney disease, stage 3 (moderate): Secondary | ICD-10-CM | POA: Diagnosis not present

## 2015-04-03 DIAGNOSIS — N183 Chronic kidney disease, stage 3 (moderate): Secondary | ICD-10-CM | POA: Diagnosis not present

## 2015-04-04 DIAGNOSIS — N183 Chronic kidney disease, stage 3 (moderate): Secondary | ICD-10-CM | POA: Diagnosis not present

## 2015-04-05 DIAGNOSIS — N183 Chronic kidney disease, stage 3 (moderate): Secondary | ICD-10-CM | POA: Diagnosis not present

## 2015-04-06 DIAGNOSIS — N183 Chronic kidney disease, stage 3 (moderate): Secondary | ICD-10-CM | POA: Diagnosis not present

## 2015-04-07 DIAGNOSIS — N183 Chronic kidney disease, stage 3 (moderate): Secondary | ICD-10-CM | POA: Diagnosis not present

## 2015-04-08 DIAGNOSIS — N183 Chronic kidney disease, stage 3 (moderate): Secondary | ICD-10-CM | POA: Diagnosis not present

## 2015-04-09 DIAGNOSIS — N183 Chronic kidney disease, stage 3 (moderate): Secondary | ICD-10-CM | POA: Diagnosis not present

## 2015-04-10 DIAGNOSIS — N183 Chronic kidney disease, stage 3 (moderate): Secondary | ICD-10-CM | POA: Diagnosis not present

## 2015-04-11 DIAGNOSIS — N183 Chronic kidney disease, stage 3 (moderate): Secondary | ICD-10-CM | POA: Diagnosis not present

## 2015-04-12 DIAGNOSIS — N183 Chronic kidney disease, stage 3 (moderate): Secondary | ICD-10-CM | POA: Diagnosis not present

## 2015-04-13 DIAGNOSIS — N183 Chronic kidney disease, stage 3 (moderate): Secondary | ICD-10-CM | POA: Diagnosis not present

## 2015-04-14 DIAGNOSIS — N183 Chronic kidney disease, stage 3 (moderate): Secondary | ICD-10-CM | POA: Diagnosis not present

## 2015-04-15 DIAGNOSIS — N183 Chronic kidney disease, stage 3 (moderate): Secondary | ICD-10-CM | POA: Diagnosis not present

## 2015-04-16 DIAGNOSIS — N183 Chronic kidney disease, stage 3 (moderate): Secondary | ICD-10-CM | POA: Diagnosis not present

## 2015-04-17 DIAGNOSIS — N183 Chronic kidney disease, stage 3 (moderate): Secondary | ICD-10-CM | POA: Diagnosis not present

## 2015-04-18 DIAGNOSIS — N183 Chronic kidney disease, stage 3 (moderate): Secondary | ICD-10-CM | POA: Diagnosis not present

## 2015-04-19 DIAGNOSIS — N183 Chronic kidney disease, stage 3 (moderate): Secondary | ICD-10-CM | POA: Diagnosis not present

## 2015-04-20 DIAGNOSIS — N183 Chronic kidney disease, stage 3 (moderate): Secondary | ICD-10-CM | POA: Diagnosis not present

## 2015-04-21 DIAGNOSIS — N183 Chronic kidney disease, stage 3 (moderate): Secondary | ICD-10-CM | POA: Diagnosis not present

## 2015-04-30 DIAGNOSIS — E1121 Type 2 diabetes mellitus with diabetic nephropathy: Secondary | ICD-10-CM | POA: Diagnosis not present

## 2015-04-30 DIAGNOSIS — R600 Localized edema: Secondary | ICD-10-CM | POA: Diagnosis not present

## 2015-04-30 DIAGNOSIS — J069 Acute upper respiratory infection, unspecified: Secondary | ICD-10-CM | POA: Diagnosis not present

## 2015-04-30 DIAGNOSIS — I10 Essential (primary) hypertension: Secondary | ICD-10-CM | POA: Diagnosis not present

## 2015-05-17 ENCOUNTER — Encounter (HOSPITAL_COMMUNITY): Payer: Self-pay | Admitting: Emergency Medicine

## 2015-05-17 ENCOUNTER — Emergency Department (HOSPITAL_COMMUNITY)
Admission: EM | Admit: 2015-05-17 | Discharge: 2015-05-18 | Disposition: A | Discharge status: Home / Self Care | Payer: Medicare Other | Attending: Emergency Medicine | Admitting: Emergency Medicine

## 2015-05-17 ENCOUNTER — Emergency Department (HOSPITAL_COMMUNITY): Payer: Medicare Other

## 2015-05-17 DIAGNOSIS — K219 Gastro-esophageal reflux disease without esophagitis: Secondary | ICD-10-CM | POA: Insufficient documentation

## 2015-05-17 DIAGNOSIS — R109 Unspecified abdominal pain: Secondary | ICD-10-CM | POA: Diagnosis present

## 2015-05-17 DIAGNOSIS — Z9049 Acquired absence of other specified parts of digestive tract: Secondary | ICD-10-CM | POA: Diagnosis not present

## 2015-05-17 DIAGNOSIS — I10 Essential (primary) hypertension: Secondary | ICD-10-CM | POA: Insufficient documentation

## 2015-05-17 DIAGNOSIS — K644 Residual hemorrhoidal skin tags: Secondary | ICD-10-CM | POA: Diagnosis not present

## 2015-05-17 DIAGNOSIS — Z90710 Acquired absence of both cervix and uterus: Secondary | ICD-10-CM | POA: Insufficient documentation

## 2015-05-17 DIAGNOSIS — Z79899 Other long term (current) drug therapy: Secondary | ICD-10-CM | POA: Insufficient documentation

## 2015-05-17 DIAGNOSIS — Z8639 Personal history of other endocrine, nutritional and metabolic disease: Secondary | ICD-10-CM | POA: Insufficient documentation

## 2015-05-17 DIAGNOSIS — Z7982 Long term (current) use of aspirin: Secondary | ICD-10-CM | POA: Insufficient documentation

## 2015-05-17 DIAGNOSIS — Z87891 Personal history of nicotine dependence: Secondary | ICD-10-CM | POA: Insufficient documentation

## 2015-05-17 DIAGNOSIS — Z8739 Personal history of other diseases of the musculoskeletal system and connective tissue: Secondary | ICD-10-CM | POA: Diagnosis not present

## 2015-05-17 DIAGNOSIS — K6389 Other specified diseases of intestine: Secondary | ICD-10-CM | POA: Diagnosis not present

## 2015-05-17 DIAGNOSIS — Z8659 Personal history of other mental and behavioral disorders: Secondary | ICD-10-CM | POA: Insufficient documentation

## 2015-05-17 DIAGNOSIS — K5901 Slow transit constipation: Secondary | ICD-10-CM

## 2015-05-17 DIAGNOSIS — Z8673 Personal history of transient ischemic attack (TIA), and cerebral infarction without residual deficits: Secondary | ICD-10-CM | POA: Insufficient documentation

## 2015-05-17 DIAGNOSIS — K5900 Constipation, unspecified: Secondary | ICD-10-CM | POA: Diagnosis not present

## 2015-05-17 DIAGNOSIS — R1084 Generalized abdominal pain: Secondary | ICD-10-CM | POA: Diagnosis not present

## 2015-05-17 HISTORY — DX: Malignant (primary) neoplasm, unspecified: C80.1

## 2015-05-17 HISTORY — DX: Type 2 diabetes mellitus without complications: E11.9

## 2015-05-17 LAB — URINALYSIS, ROUTINE W REFLEX MICROSCOPIC
Bilirubin Urine: NEGATIVE
Glucose, UA: NEGATIVE mg/dL
Hgb urine dipstick: NEGATIVE
Ketones, ur: NEGATIVE mg/dL
Leukocytes, UA: NEGATIVE
Nitrite: NEGATIVE
PH: 6 (ref 5.0–8.0)
Protein, ur: NEGATIVE mg/dL
SPECIFIC GRAVITY, URINE: 1.006 (ref 1.005–1.030)
Urobilinogen, UA: 0.2 mg/dL (ref 0.0–1.0)

## 2015-05-17 LAB — COMPREHENSIVE METABOLIC PANEL
ALT: 15 U/L (ref 14–54)
ANION GAP: 8 (ref 5–15)
AST: 22 U/L (ref 15–41)
Albumin: 3.7 g/dL (ref 3.5–5.0)
Alkaline Phosphatase: 60 U/L (ref 38–126)
BUN: 17 mg/dL (ref 6–20)
CO2: 23 mmol/L (ref 22–32)
Calcium: 10 mg/dL (ref 8.9–10.3)
Chloride: 103 mmol/L (ref 101–111)
Creatinine, Ser: 1 mg/dL (ref 0.44–1.00)
GFR calc non Af Amer: 51 mL/min — ABNORMAL LOW (ref >60)
GFR, EST AFRICAN AMERICAN: 59 mL/min — AB (ref >60)
Glucose, Bld: 115 mg/dL — ABNORMAL HIGH (ref 65–99)
POTASSIUM: 4.4 mmol/L (ref 3.5–5.1)
SODIUM: 134 mmol/L — AB (ref 135–145)
TOTAL PROTEIN: 7.9 g/dL (ref 6.5–8.1)
Total Bilirubin: 0.2 mg/dL — ABNORMAL LOW (ref 0.3–1.2)

## 2015-05-17 LAB — CBC WITH DIFFERENTIAL/PLATELET
BASOS PCT: 0 % (ref 0–1)
Basophils Absolute: 0 10*3/uL (ref 0.0–0.1)
Eosinophils Absolute: 0 10*3/uL (ref 0.0–0.7)
Eosinophils Relative: 0 % (ref 0–5)
HEMATOCRIT: 34.4 % — AB (ref 36.0–46.0)
HEMOGLOBIN: 11.1 g/dL — AB (ref 12.0–15.0)
LYMPHS PCT: 82 % — AB (ref 12–46)
Lymphs Abs: 9.5 10*3/uL — ABNORMAL HIGH (ref 0.7–4.0)
MCH: 27.5 pg (ref 26.0–34.0)
MCHC: 32.3 g/dL (ref 30.0–36.0)
MCV: 85.1 fL (ref 78.0–100.0)
Monocytes Absolute: 0.3 10*3/uL (ref 0.1–1.0)
Monocytes Relative: 3 % (ref 3–12)
NEUTROS ABS: 1.7 10*3/uL (ref 1.7–7.7)
Neutrophils Relative %: 15 % — ABNORMAL LOW (ref 43–77)
PLATELETS: 148 10*3/uL — AB (ref 150–400)
RBC: 4.04 MIL/uL (ref 3.87–5.11)
RDW: 13.3 % (ref 11.5–15.5)
WBC: 11.5 10*3/uL — ABNORMAL HIGH (ref 4.0–10.5)

## 2015-05-17 LAB — LIPASE, BLOOD: Lipase: 48 U/L (ref 22–51)

## 2015-05-17 MED ORDER — SODIUM CHLORIDE 0.9 % IV SOLN
1000.0000 mL | INTRAVENOUS | Status: DC
  Administered 2015-05-17: 1000 mL via INTRAVENOUS

## 2015-05-17 MED ORDER — PANTOPRAZOLE SODIUM 40 MG IV SOLR
40.0000 mg | Freq: Once | INTRAVENOUS | Status: AC
  Administered 2015-05-17: 40 mg via INTRAVENOUS
  Filled 2015-05-17: qty 40

## 2015-05-17 MED ORDER — IOHEXOL 300 MG/ML  SOLN: 100.0000 mL | Freq: Once | INTRAMUSCULAR | Status: AC | PRN

## 2015-05-17 MED ORDER — IOHEXOL 300 MG/ML  SOLN
25.0000 mL | Freq: Once | INTRAMUSCULAR | Status: AC | PRN
  Administered 2015-05-17: 25 mL via ORAL

## 2015-05-17 NOTE — ED Notes (Signed)
IV attempt unsuccessful

## 2015-05-17 NOTE — ED Notes (Signed)
Pt. reports constipation and fecal impaction onset this week with abdominal pressure , denies emesis / no fever or chills.

## 2015-05-17 NOTE — ED Provider Notes (Signed)
CSN: [id number]     Arrival date & time 05/17/15  2034 History   First MD Initiated Contact with Patient 05/17/15 2053     Chief Complaint  Patient presents with  . Fecal Impaction  . Constipation   The history is provided by the patient.   patient presents to the emergency room with complaints of abdominal pain and constipation. Patient has a history of having trouble with chronic constipation. She states she has been told that she has a lazy bowel. She chronically takes MiraLAX and other accidents. Starting on July 4, she noticed that she was having increasing trouble with her constipation that wasn't being relieved by her usual regimen. She took some of her medications and then noticed that she had some mild improvement and then over the last week the symptoms started again.  Patient states she has had some trouble with nausea now. She denies any trouble with vomiting. She has not noticed any blood in her stool. She eyes any diarrhea. She does not feel like she's had a normal bowel movement for several days. She has noticed some discomfort with urination as well.  Past Medical History  Diagnosis Date  . Stroke   . Acid reflux disease   . Panic attacks   . Hypertension   . DDD (degenerative disc disease), lumbar   . Dyslipidemia   . Degenerative joint disease    Past Surgical History  Procedure Laterality Date  . Cholecystectomy    . Abdominal hysterectomy    . Laparotomy N/A 04/29/2014    Procedure: EXPLORATORY LAPAROTOMY;  Surgeon: Imogene Burn. Georgette Dover, MD;  Location: [place];  Service: General;  Laterality: N/A;  . Ventral hernia repair N/A 04/29/2014    Procedure: PRIMARY REPAIR OF VENTRAL HERNIA;  Surgeon: Imogene Burn. Georgette Dover, MD;  Location: [place];  Service: General;  Laterality: N/A;  . Lysis of adhesion N/A 04/29/2014    Procedure: EXTENSIVE LYSIS OF ADHESIONS;  Surgeon: Imogene Burn. Georgette Dover, MD;  Location: [facility] [place];  Service: General;  Laterality: N/A;   No family history on file. History   Substance Use Topics  . Smoking status: Former Research scientist (life sciences)  . Smokeless tobacco: Not on file  . Alcohol Use: No   OB History    No data available     Review of Systems  All other systems reviewed and are negative.     Allergies  Morphine and related  Home Medications   Prior to Admission medications   Medication Sig Start Date End Date Taking? Authorizing Provider  acetaZOLAMIDE (DIAMOX) 250 MG tablet Take 250 mg by mouth 3 (three) times daily.   Yes Historical Provider, MD  aspirin EC 325 MG tablet Take 325 mg by mouth daily.     Yes Historical Provider, MD  calcium carbonate (TUMS) 500 MG chewable tablet Chew 1-2 tablets by mouth as needed for indigestion or heartburn.   Yes Historical Provider, MD  docusate sodium (COLACE) 100 MG capsule Take 1 capsule (100 mg total) by mouth every 12 (twelve) hours. 10/19/14   Julianne Rice, MD  glycerin adult (GLYCERIN ADULT) 2 G SUPP Place 1 suppository rectally once as needed for moderate constipation. 06/29/14   Domenic Moras, PA-C  lactulose (CEPHULAC) 10 G packet Take 1 packet (10 g total) by mouth 2 (two) times daily. 10/19/14   Julianne Rice, MD  lisinopril (PRINIVIL,ZESTRIL) 10 MG tablet Take 10 mg by mouth daily.   Yes Historical Provider, MD  pantoprazole (PROTONIX) 40 MG tablet Take 40 mg  by mouth daily.   Yes Historical Provider, MD  polyethylene glycol (MIRALAX / GLYCOLAX) packet Take 17 g by mouth 3 (three) times daily.    Yes Historical Provider, MD  prednisoLONE acetate (PRED FORTE) 1 % ophthalmic suspension Place 1 drop into the right eye 2 (two) times daily.  08/30/14  Yes Historical Provider, MD  Sennosides (EX-LAX) 15 MG TABS Take 15 mg by mouth as needed (for laxative).   Yes Historical Provider, MD  SIMBRINZA 1-0.2 % SUSP Place 1 drop into both eyes 3 (three) times daily. 04/08/15  Yes Historical Provider, MD  traMADol (ULTRAM) 50 MG tablet Take 1-2 tablets (50-100 mg total) by mouth every 6 (six) hours as needed for moderate  pain or severe pain. Patient not taking: Reported on 10/18/2014 05/08/14   Nat Christen, PA-C  verapamil (CALAN-SR) 120 MG CR tablet Take 1 tablet (120 mg total) by mouth daily. 09/10/11 05/17/15 Yes Bonnielee Haff, MD   BP 169/98 mmHg  Pulse 63  Temp(Src) 98.3 F (36.8 C) (Oral)  Resp 14  SpO2 98% Physical Exam  Constitutional: No distress.  HENT:  Head: Normocephalic and atraumatic.  Right Ear: External ear normal.  Left Ear: External ear normal.  Eyes: Conjunctivae are normal. Right eye exhibits no discharge. Left eye exhibits no discharge. No scleral icterus.  Neck: Neck supple. No tracheal deviation present.  Cardiovascular: Normal rate, regular rhythm and intact distal pulses.   Pulmonary/Chest: Effort normal and breath sounds normal. No stridor. No respiratory distress. She has no wheezes. She has no rales.  Abdominal: Soft. Bowel sounds are normal. She exhibits no distension. There is generalized tenderness (mild). There is no rigidity, no rebound and no guarding. No hernia.  Genitourinary: Rectal exam shows external hemorrhoid. Rectal exam shows no mass and no tenderness.  Soft stool noted proximally but no fecal impaction noted on exam  Musculoskeletal: She exhibits no edema or tenderness.  Neurological: She is alert. She has normal strength. No cranial nerve deficit (no facial droop, extraocular movements intact, no slurred speech) or sensory deficit. She exhibits normal muscle tone. She displays no seizure activity. Coordination normal.  Skin: Skin is warm and dry. No rash noted. She is not diaphoretic.  Psychiatric: She has a normal mood and affect.  Nursing note and vitals reviewed.   ED Course  Procedures (including critical care time) Labs Review Labs Reviewed  CBC WITH DIFFERENTIAL/PLATELET - Abnormal; Notable for the following:    WBC 11.5 (*)    Hemoglobin 11.1 (*)    HCT 34.4 (*)    Platelets 148 (*)    Neutrophils Relative % 15 (*)    Lymphocytes Relative 82  (*)    Lymphs Abs 9.5 (*)    All other components within normal limits  COMPREHENSIVE METABOLIC PANEL - Abnormal; Notable for the following:    Sodium 134 (*)    Glucose, Bld 115 (*)    Total Bilirubin 0.2 (*)    GFR calc non Af Amer 51 (*)    GFR calc Af Amer 59 (*)    All other components within normal limits  LIPASE, BLOOD  URINALYSIS, ROUTINE W REFLEX MICROSCOPIC (NOT AT [facility])   1128  Pt requested an antacid  MDM   Pt has been complaining of constipation and abdominal pain.  Considering her abdominal ttp, and increased wbc count will ct to evaluate further    Dorie Rank, MD 05/17/15 2328

## 2015-05-18 ENCOUNTER — Encounter (HOSPITAL_COMMUNITY): Payer: Self-pay | Admitting: Radiology

## 2015-05-18 DIAGNOSIS — K5900 Constipation, unspecified: Secondary | ICD-10-CM | POA: Diagnosis not present

## 2015-05-18 DIAGNOSIS — K5901 Slow transit constipation: Secondary | ICD-10-CM | POA: Diagnosis not present

## 2015-05-18 DIAGNOSIS — K6389 Other specified diseases of intestine: Secondary | ICD-10-CM | POA: Diagnosis not present

## 2015-05-18 MED ORDER — MAGNESIUM CITRATE PO SOLN
1.0000 | Freq: Once | ORAL | Status: AC
  Administered 2015-05-18: 1 via ORAL
  Filled 2015-05-18: qty 296

## 2015-05-18 MED ORDER — IOHEXOL 300 MG/ML  SOLN
100.0000 mL | Freq: Once | INTRAMUSCULAR | Status: AC | PRN
  Administered 2015-05-18: 100 mL via INTRAVENOUS

## 2015-05-18 NOTE — ED Provider Notes (Signed)
Patient was signed out to me as pending CT scan of abdomen and pelvis. CT scan reveals large amounts of stool in the colon, ileus versus constipation. She has a history of chronic constipation, this is likely the diagnosis. She was given a dose of magnesium citrate in the emergency department and advised to continue her meter Lasix at home. Primary care follow-up was advised in 3 days. She appears well in no acute distress, her vital signs were within her normal limits and she is safe for discharge.  Everlene Balls, MD 05/18/15 [phone]

## 2015-05-18 NOTE — Discharge Instructions (Signed)
Constipation [name], continue to take Maalox and titrate so that you have one bowel movement per day. See your primary care physician within 3 days for close follow-up. If symptoms worsen come back to the emergency department immediately. Thank you.  Constipation is when a person:  Poops (has a bowel movement) less than 3 times a week.  Has a hard time pooping.  Has poop that is dry, hard, or bigger than normal. HOME CARE   Eat foods with a lot of fiber in them. This includes fruits, vegetables, beans, and whole grains such as brown rice.  Avoid fatty foods and foods with a lot of sugar. This includes french fries, hamburgers, cookies, candy, and soda.  If you are not getting enough fiber from food, take products with added fiber in them (supplements).  Drink enough fluid to keep your pee (urine) clear or pale yellow.  Exercise on a regular basis, or as told by your doctor.  Go to the restroom when you feel like you need to poop. Do not hold it.  Only take medicine as told by your doctor. Do not take medicines that help you poop (laxatives) without talking to your doctor first. GET HELP RIGHT AWAY IF:   You have bright red blood in your poop (stool).  Your constipation lasts more than 4 days or gets worse.  You have belly (abdominal) or butt (rectal) pain.  You have thin poop (as thin as a pencil).  You lose weight, and it cannot be explained. MAKE SURE YOU:   Understand these instructions.  Will watch your condition.  Will get help right away if you are not doing well or get worse. Document Released: 04/06/2008 Document Revised: 10/24/2013 Document Reviewed: 07/31/2013 [facility] Patient Information 2015 [place] [employer] [place], [place]. This information is not intended to replace advice given to you by your health care provider. Make sure you discuss any questions you have with your health care provider.

## 2015-05-20 LAB — PATHOLOGIST SMEAR REVIEW

## 2015-05-30 DIAGNOSIS — M179 Osteoarthritis of knee, unspecified: Secondary | ICD-10-CM | POA: Diagnosis not present

## 2015-05-30 DIAGNOSIS — I10 Essential (primary) hypertension: Secondary | ICD-10-CM | POA: Diagnosis not present

## 2015-05-30 DIAGNOSIS — E1121 Type 2 diabetes mellitus with diabetic nephropathy: Secondary | ICD-10-CM | POA: Diagnosis not present

## 2015-05-30 DIAGNOSIS — I872 Venous insufficiency (chronic) (peripheral): Secondary | ICD-10-CM | POA: Diagnosis not present

## 2015-06-10 DIAGNOSIS — H10413 Chronic giant papillary conjunctivitis, bilateral: Secondary | ICD-10-CM | POA: Diagnosis not present

## 2015-06-10 DIAGNOSIS — Z961 Presence of intraocular lens: Secondary | ICD-10-CM | POA: Diagnosis not present

## 2015-06-10 DIAGNOSIS — H4011X1 Primary open-angle glaucoma, mild stage: Secondary | ICD-10-CM | POA: Diagnosis not present

## 2015-06-10 DIAGNOSIS — H25812 Combined forms of age-related cataract, left eye: Secondary | ICD-10-CM | POA: Diagnosis not present

## 2015-06-10 DIAGNOSIS — H1851 Endothelial corneal dystrophy: Secondary | ICD-10-CM | POA: Diagnosis not present

## 2015-06-18 DIAGNOSIS — N3000 Acute cystitis without hematuria: Secondary | ICD-10-CM | POA: Diagnosis not present

## 2015-06-18 DIAGNOSIS — I10 Essential (primary) hypertension: Secondary | ICD-10-CM | POA: Diagnosis not present

## 2015-06-18 DIAGNOSIS — I872 Venous insufficiency (chronic) (peripheral): Secondary | ICD-10-CM | POA: Diagnosis not present

## 2015-06-18 DIAGNOSIS — E1121 Type 2 diabetes mellitus with diabetic nephropathy: Secondary | ICD-10-CM | POA: Diagnosis not present

## 2015-07-02 DIAGNOSIS — M179 Osteoarthritis of knee, unspecified: Secondary | ICD-10-CM | POA: Diagnosis not present

## 2015-07-02 DIAGNOSIS — I10 Essential (primary) hypertension: Secondary | ICD-10-CM | POA: Diagnosis not present

## 2015-07-02 DIAGNOSIS — E1121 Type 2 diabetes mellitus with diabetic nephropathy: Secondary | ICD-10-CM | POA: Diagnosis not present

## 2015-07-02 DIAGNOSIS — I872 Venous insufficiency (chronic) (peripheral): Secondary | ICD-10-CM | POA: Diagnosis not present

## 2015-10-01 DIAGNOSIS — Z23 Encounter for immunization: Secondary | ICD-10-CM | POA: Diagnosis not present

## 2015-10-01 DIAGNOSIS — E1121 Type 2 diabetes mellitus with diabetic nephropathy: Secondary | ICD-10-CM | POA: Diagnosis not present

## 2015-10-01 DIAGNOSIS — K219 Gastro-esophageal reflux disease without esophagitis: Secondary | ICD-10-CM | POA: Diagnosis not present

## 2015-10-01 DIAGNOSIS — Z008 Encounter for other general examination: Secondary | ICD-10-CM | POA: Diagnosis not present

## 2015-10-01 DIAGNOSIS — Z0000 Encounter for general adult medical examination without abnormal findings: Secondary | ICD-10-CM | POA: Diagnosis not present

## 2015-10-01 DIAGNOSIS — I10 Essential (primary) hypertension: Secondary | ICD-10-CM | POA: Diagnosis not present

## 2016-01-02 ENCOUNTER — Emergency Department (HOSPITAL_COMMUNITY)
Admission: EM | Admit: 2016-01-02 | Discharge: 2016-01-02 | Disposition: A | Discharge status: Home / Self Care | Payer: Medicare Other | Attending: Emergency Medicine | Admitting: Emergency Medicine

## 2016-01-02 ENCOUNTER — Emergency Department (HOSPITAL_COMMUNITY): Payer: Medicare Other

## 2016-01-02 DIAGNOSIS — Z8673 Personal history of transient ischemic attack (TIA), and cerebral infarction without residual deficits: Secondary | ICD-10-CM | POA: Insufficient documentation

## 2016-01-02 DIAGNOSIS — I10 Essential (primary) hypertension: Secondary | ICD-10-CM | POA: Insufficient documentation

## 2016-01-02 DIAGNOSIS — Z79899 Other long term (current) drug therapy: Secondary | ICD-10-CM | POA: Insufficient documentation

## 2016-01-02 DIAGNOSIS — K219 Gastro-esophageal reflux disease without esophagitis: Secondary | ICD-10-CM | POA: Insufficient documentation

## 2016-01-02 DIAGNOSIS — J159 Unspecified bacterial pneumonia: Secondary | ICD-10-CM | POA: Diagnosis not present

## 2016-01-02 DIAGNOSIS — R011 Cardiac murmur, unspecified: Secondary | ICD-10-CM | POA: Diagnosis not present

## 2016-01-02 DIAGNOSIS — E119 Type 2 diabetes mellitus without complications: Secondary | ICD-10-CM | POA: Diagnosis not present

## 2016-01-02 DIAGNOSIS — Z7982 Long term (current) use of aspirin: Secondary | ICD-10-CM | POA: Diagnosis not present

## 2016-01-02 DIAGNOSIS — Z8639 Personal history of other endocrine, nutritional and metabolic disease: Secondary | ICD-10-CM | POA: Diagnosis not present

## 2016-01-02 DIAGNOSIS — Z859 Personal history of malignant neoplasm, unspecified: Secondary | ICD-10-CM | POA: Insufficient documentation

## 2016-01-02 DIAGNOSIS — Z87891 Personal history of nicotine dependence: Secondary | ICD-10-CM | POA: Diagnosis not present

## 2016-01-02 DIAGNOSIS — E860 Dehydration: Secondary | ICD-10-CM | POA: Diagnosis not present

## 2016-01-02 DIAGNOSIS — J189 Pneumonia, unspecified organism: Secondary | ICD-10-CM

## 2016-01-02 DIAGNOSIS — R05 Cough: Secondary | ICD-10-CM | POA: Diagnosis present

## 2016-01-02 LAB — COMPREHENSIVE METABOLIC PANEL
ALBUMIN: 3.2 g/dL — AB (ref 3.5–5.0)
ALT: 11 U/L — AB (ref 14–54)
ANION GAP: 12 (ref 5–15)
AST: 22 U/L (ref 15–41)
Alkaline Phosphatase: 54 U/L (ref 38–126)
BUN: 15 mg/dL (ref 6–20)
CALCIUM: 9.9 mg/dL (ref 8.9–10.3)
CO2: 21 mmol/L — AB (ref 22–32)
Chloride: 106 mmol/L (ref 101–111)
Creatinine, Ser: 1.11 mg/dL — ABNORMAL HIGH (ref 0.44–1.00)
GFR calc Af Amer: 52 mL/min — ABNORMAL LOW (ref >60)
GFR calc non Af Amer: 45 mL/min — ABNORMAL LOW (ref >60)
GLUCOSE: 83 mg/dL (ref 65–99)
Potassium: 4.3 mmol/L (ref 3.5–5.1)
SODIUM: 139 mmol/L (ref 135–145)
Total Bilirubin: 1.1 mg/dL (ref 0.3–1.2)
Total Protein: 7.3 g/dL (ref 6.5–8.1)

## 2016-01-02 LAB — URINALYSIS, ROUTINE W REFLEX MICROSCOPIC
Bilirubin Urine: NEGATIVE
GLUCOSE, UA: NEGATIVE mg/dL
Hgb urine dipstick: NEGATIVE
KETONES UR: NEGATIVE mg/dL
LEUKOCYTES UA: NEGATIVE
Nitrite: NEGATIVE
PH: 6.5 (ref 5.0–8.0)
Protein, ur: NEGATIVE mg/dL
Specific Gravity, Urine: 1.017 (ref 1.005–1.030)

## 2016-01-02 LAB — CBC WITH DIFFERENTIAL/PLATELET
BASOS ABS: 0 10*3/uL (ref 0.0–0.1)
Basophils Relative: 0 %
Eosinophils Absolute: 0 10*3/uL (ref 0.0–0.7)
Eosinophils Relative: 0 %
HCT: 28.9 % — ABNORMAL LOW (ref 36.0–46.0)
Hemoglobin: 9.6 g/dL — ABNORMAL LOW (ref 12.0–15.0)
LYMPHS ABS: 9 10*3/uL — AB (ref 0.7–4.0)
Lymphocytes Relative: 69 %
MCH: 29.2 pg (ref 26.0–34.0)
MCHC: 33.2 g/dL (ref 30.0–36.0)
MCV: 87.8 fL (ref 78.0–100.0)
MONO ABS: 0.4 10*3/uL (ref 0.1–1.0)
MONOS PCT: 3 %
Neutro Abs: 3.6 10*3/uL (ref 1.7–7.7)
Neutrophils Relative %: 28 %
Platelets: 143 10*3/uL — ABNORMAL LOW (ref 150–400)
RBC: 3.29 MIL/uL — ABNORMAL LOW (ref 3.87–5.11)
RDW: 13.1 % (ref 11.5–15.5)
WBC: 13 10*3/uL — AB (ref 4.0–10.5)

## 2016-01-02 MED ORDER — SODIUM CHLORIDE 0.9 % IV BOLUS (SEPSIS)
1000.0000 mL | Freq: Once | INTRAVENOUS | Status: AC
  Administered 2016-01-02: 1000 mL via INTRAVENOUS

## 2016-01-02 MED ORDER — LEVOFLOXACIN 500 MG PO TABS: 500.0000 mg | ORAL_TABLET | Freq: Every day | ORAL | Status: DC

## 2016-01-02 MED ORDER — LEVOFLOXACIN IN D5W 750 MG/150ML IV SOLN
750.0000 mg | Freq: Once | INTRAVENOUS | Status: AC
  Administered 2016-01-02: 750 mg via INTRAVENOUS
  Filled 2016-01-02: qty 150

## 2016-01-02 MED ORDER — ACETAMINOPHEN 325 MG PO TABS
650.0000 mg | ORAL_TABLET | Freq: Once | ORAL | Status: AC
  Administered 2016-01-02: 650 mg via ORAL
  Filled 2016-01-02: qty 2

## 2016-01-02 NOTE — Discharge Instructions (Signed)
Take levaquin daily for a week.   Stop taking augmentin   Stay hydrated.   Take tylenol, motrin for fever.   See your doctor in 3-4 days.   Return to ER if you have fever for a week, trouble breathing, flank pain, vomiting.

## 2016-01-02 NOTE — ED Provider Notes (Addendum)
CSN: [id number]     Arrival date & time 01/02/16  1033 History   First MD Initiated Contact with Patient 01/02/16 1045     Chief Complaint  Patient presents with  . Cough  . Generalized Body Aches     (Consider location/radiation/quality/duration/timing/severity/associated sxs/prior Treatment) The history is provided by the patient.  Erika Day is a 82 y.o. female hx of DM, reflux, HTN here with body aches, cough. Some cough and generalized body aches for the last 2 weeks or so. She saw her primary care doctor started on daily Augmentin on 2/20. She saw her primary care doctor yesterday and was told that she has some bronchitis. She states that since yesterday she has some subjective fevers. She has worsening right sided flank pain and as well as has been coughing despite getting cough medicine. Denies chest pain or shortness of breath. The uterine symptoms or vomiting.    Past Medical History  Diagnosis Date  . Stroke   . Acid reflux disease   . Panic attacks   . Hypertension   . DDD (degenerative disc disease), lumbar   . Dyslipidemia   . Degenerative joint disease   . Cancer   . Diabetes mellitus without complication    Past Surgical History  Procedure Laterality Date  . Cholecystectomy    . Abdominal hysterectomy    . Laparotomy N/A 04/29/2014    Procedure: EXPLORATORY LAPAROTOMY;  Surgeon: Imogene Burn. Georgette Dover, MD;  Location: [place];  Service: General;  Laterality: N/A;  . Ventral hernia repair N/A 04/29/2014    Procedure: PRIMARY REPAIR OF VENTRAL HERNIA;  Surgeon: Imogene Burn. Georgette Dover, MD;  Location: [place];  Service: General;  Laterality: N/A;  . Lysis of adhesion N/A 04/29/2014    Procedure: EXTENSIVE LYSIS OF ADHESIONS;  Surgeon: Imogene Burn. Georgette Dover, MD;  Location: [place];  Service: General;  Laterality: N/A;   No family history on file. Social History  Substance Use Topics  . Smoking status: Former Research scientist (life sciences)  . Smokeless tobacco: Not on file  . Alcohol Use: No   OB History    No data available     Review of Systems  Constitutional: Positive for fever.  Respiratory: Positive for cough.   All other systems reviewed and are negative.     Allergies  Morphine and related  Home Medications   Prior to Admission medications   Medication Sig Start Date End Date Taking? Authorizing Provider  acetaZOLAMIDE (DIAMOX) 250 MG tablet Take 250 mg by mouth 3 (three) times daily.    Historical Provider, MD  aspirin EC 325 MG tablet Take 325 mg by mouth daily.      Historical Provider, MD  calcium carbonate (TUMS) 500 MG chewable tablet Chew 1-2 tablets by mouth as needed for indigestion or heartburn.    Historical Provider, MD  docusate sodium (COLACE) 100 MG capsule Take 1 capsule (100 mg total) by mouth every 12 (twelve) hours. 10/19/14   Julianne Rice, MD  glycerin adult (GLYCERIN ADULT) 2 G SUPP Place 1 suppository rectally once as needed for moderate constipation. 06/29/14   Domenic Moras, PA-C  lactulose (CEPHULAC) 10 G packet Take 1 packet (10 g total) by mouth 2 (two) times daily. 10/19/14   Julianne Rice, MD  levofloxacin (LEVAQUIN) 500 MG tablet Take 1 tablet (500 mg total) by mouth daily. X 7 days 01/02/16   Wandra Arthurs, MD  lisinopril (PRINIVIL,ZESTRIL) 10 MG tablet Take 10 mg by mouth daily.    Historical Provider,  MD  pantoprazole (PROTONIX) 40 MG tablet Take 40 mg by mouth daily.    Historical Provider, MD  polyethylene glycol (MIRALAX / GLYCOLAX) packet Take 17 g by mouth 3 (three) times daily.     Historical Provider, MD  prednisoLONE acetate (PRED FORTE) 1 % ophthalmic suspension Place 1 drop into the right eye 2 (two) times daily.  08/30/14   Historical Provider, MD  Sennosides (EX-LAX) 15 MG TABS Take 15 mg by mouth as needed (for laxative).    Historical Provider, MD  SIMBRINZA 1-0.2 % SUSP Place 1 drop into both eyes 3 (three) times daily. 04/08/15   Historical Provider, MD  traMADol (ULTRAM) 50 MG tablet Take 1-2 tablets (50-100 mg total) by mouth every 6  (six) hours as needed for moderate pain or severe pain. Patient not taking: Reported on 10/18/2014 05/08/14   Nat Christen, PA-C  verapamil (CALAN-SR) 120 MG CR tablet Take 1 tablet (120 mg total) by mouth daily. 09/10/11 05/17/15  Bonnielee Haff, MD   BP 164/72 mmHg  Pulse 71  Temp(Src) 100.3 F (37.9 C) (Rectal)  Resp 18  SpO2 100% Physical Exam  Constitutional: She is oriented to person, place, and time.  Chronically ill, slightly dehydrated   HENT:  Head: Normocephalic.  MM slightly dry   Eyes: Conjunctivae are normal. Pupils are equal, round, and reactive to light.  Neck: Normal range of motion. Neck supple.  Cardiovascular: Normal rate and regular rhythm.   Mild 2/6 systolic murmur loudest at LUSB (likely aortic stenosis)   Pulmonary/Chest: Effort normal.  Diminished R base   Abdominal: Soft. Bowel sounds are normal. She exhibits no distension. There is no tenderness. There is no rebound.  Mild R CVAT   Musculoskeletal: Normal range of motion.  Neurological: She is alert and oriented to person, place, and time.  Skin: Skin is warm and dry.  Psychiatric: She has a normal mood and affect. Her behavior is normal. Judgment and thought content normal.  Nursing note and vitals reviewed.   ED Course  Procedures (including critical care time) Labs Review Labs Reviewed  CBC WITH DIFFERENTIAL/PLATELET - Abnormal; Notable for the following:    WBC 13.0 (*)    RBC 3.29 (*)    Hemoglobin 9.6 (*)    HCT 28.9 (*)    Platelets 143 (*)    Lymphs Abs 9.0 (*)    All other components within normal limits  COMPREHENSIVE METABOLIC PANEL - Abnormal; Notable for the following:    CO2 21 (*)    Creatinine, Ser 1.11 (*)    Albumin 3.2 (*)    ALT 11 (*)    GFR calc non Af Amer 45 (*)    GFR calc Af Amer 52 (*)    All other components within normal limits  URINE CULTURE  URINALYSIS, ROUTINE W REFLEX MICROSCOPIC (NOT AT [facility])    Imaging Review Dg Chest 2 View  01/02/2016  CLINICAL DATA:   Nonproductive cough for the past 2 weeks, history of diabetes, ovarian malignancy, former smoker. EXAM: CHEST  2 VIEW COMPARISON:  Chest x-ray of October 19, 2014 FINDINGS: Mild elevation of the right hemidiaphragm more conspicuous than in the past. There is atelectasis or pneumonia posteriorly in the right lower lobe and likely anteriorly in the right middle lobe. The heart is normal in size. The pulmonary vascularity is not engorged. There is multilevel degenerative disc disease of the thoracic spine. IMPRESSION: Volume loss especially on the right with with right basilar atelectasis or  pneumonia. Followup PA and lateral chest X-ray is recommended in 3-4 weeks following trial of antibiotic therapy to ensure resolution and exclude underlying malignancy. Electronically Signed   By: David  Martinique M.D.   On: 01/02/2016 12:39   I have personally reviewed and evaluated these images and lab results as part of my medical decision-making.   EKG Interpretation None      MDM   Final diagnoses:  CAP (community acquired pneumonia)   Erika Day is a 82 y.o. female here with cough, fever, flank pain. Consider pneumonia vs flu vs pyelo. Will get labs, CXR, UA. Will hydrate and reassess.   3:29 PM Low grade temp 100.3 F in the ED. WBC 13. CXR showed RLL pneumonia. Never hypoxic or tachy. Nl BP. Will change to levaquin. Of note, she states that she had some rectal pain. Did rectal exam, saw some hemorrhoids with no bleeding. No obvious perirectal abscess. Abdomen nontender.     Wandra Arthurs, MD 01/02/16 1538  Wandra Arthurs, MD 01/02/16 [phone]

## 2016-01-02 NOTE — ED Notes (Signed)
MD at bedside. 

## 2016-01-02 NOTE — ED Notes (Signed)
Pt presents via POV c/o cough and generalized body aches x 1.5 weeks.  Pt reports going to PCP yesterday and was dx with bronchitis, states "im just not feeling any better".  Pt reports fever at home, afebrile on arrival.  Pt alert, family at bedside.

## 2016-01-02 NOTE — ED Notes (Signed)
Resident at bedside.  

## 2016-01-03 LAB — URINE CULTURE: CULTURE: NO GROWTH

## 2016-01-03 LAB — PATHOLOGIST SMEAR REVIEW

## 2016-01-20 ENCOUNTER — Emergency Department (HOSPITAL_COMMUNITY)
Admission: EM | Admit: 2016-01-20 | Discharge: 2016-01-20 | Disposition: A | Discharge status: Home / Self Care | Payer: Medicare Other | Attending: Emergency Medicine | Admitting: Emergency Medicine

## 2016-01-20 ENCOUNTER — Emergency Department (HOSPITAL_COMMUNITY): Payer: Medicare Other

## 2016-01-20 ENCOUNTER — Encounter (HOSPITAL_COMMUNITY): Payer: Self-pay | Admitting: Emergency Medicine

## 2016-01-20 DIAGNOSIS — Z8659 Personal history of other mental and behavioral disorders: Secondary | ICD-10-CM | POA: Diagnosis not present

## 2016-01-20 DIAGNOSIS — E119 Type 2 diabetes mellitus without complications: Secondary | ICD-10-CM | POA: Insufficient documentation

## 2016-01-20 DIAGNOSIS — Z859 Personal history of malignant neoplasm, unspecified: Secondary | ICD-10-CM | POA: Insufficient documentation

## 2016-01-20 DIAGNOSIS — M1990 Unspecified osteoarthritis, unspecified site: Secondary | ICD-10-CM | POA: Insufficient documentation

## 2016-01-20 DIAGNOSIS — Z79899 Other long term (current) drug therapy: Secondary | ICD-10-CM | POA: Insufficient documentation

## 2016-01-20 DIAGNOSIS — M545 Low back pain: Secondary | ICD-10-CM | POA: Insufficient documentation

## 2016-01-20 DIAGNOSIS — I10 Essential (primary) hypertension: Secondary | ICD-10-CM | POA: Insufficient documentation

## 2016-01-20 DIAGNOSIS — Z7982 Long term (current) use of aspirin: Secondary | ICD-10-CM | POA: Diagnosis not present

## 2016-01-20 DIAGNOSIS — Z8673 Personal history of transient ischemic attack (TIA), and cerebral infarction without residual deficits: Secondary | ICD-10-CM | POA: Diagnosis not present

## 2016-01-20 DIAGNOSIS — Z87891 Personal history of nicotine dependence: Secondary | ICD-10-CM | POA: Diagnosis not present

## 2016-01-20 DIAGNOSIS — R0789 Other chest pain: Secondary | ICD-10-CM | POA: Insufficient documentation

## 2016-01-20 DIAGNOSIS — Z8719 Personal history of other diseases of the digestive system: Secondary | ICD-10-CM | POA: Insufficient documentation

## 2016-01-20 LAB — CBC WITH DIFFERENTIAL/PLATELET
BASOS ABS: 0 10*3/uL (ref 0.0–0.1)
Basophils Relative: 0 %
EOS PCT: 0 %
Eosinophils Absolute: 0 10*3/uL (ref 0.0–0.7)
HCT: 30.4 % — ABNORMAL LOW (ref 36.0–46.0)
Hemoglobin: 9.7 g/dL — ABNORMAL LOW (ref 12.0–15.0)
LYMPHS ABS: 6.4 10*3/uL — AB (ref 0.7–4.0)
Lymphocytes Relative: 74 %
MCH: 28 pg (ref 26.0–34.0)
MCHC: 31.9 g/dL (ref 30.0–36.0)
MCV: 87.9 fL (ref 78.0–100.0)
MONO ABS: 0.3 10*3/uL (ref 0.1–1.0)
MONOS PCT: 3 %
NEUTROS PCT: 23 %
Neutro Abs: 2 10*3/uL (ref 1.7–7.7)
PLATELETS: 143 10*3/uL — AB (ref 150–400)
RBC: 3.46 MIL/uL — AB (ref 3.87–5.11)
RDW: 13 % (ref 11.5–15.5)
WBC: 8.7 10*3/uL (ref 4.0–10.5)

## 2016-01-20 LAB — BASIC METABOLIC PANEL
Anion gap: 7 (ref 5–15)
BUN: 15 mg/dL (ref 6–20)
CHLORIDE: 101 mmol/L (ref 101–111)
CO2: 25 mmol/L (ref 22–32)
CREATININE: 0.9 mg/dL (ref 0.44–1.00)
Calcium: 9.7 mg/dL (ref 8.9–10.3)
GFR, EST NON AFRICAN AMERICAN: 58 mL/min — AB (ref >60)
Glucose, Bld: 94 mg/dL (ref 65–99)
POTASSIUM: 4.4 mmol/L (ref 3.5–5.1)
SODIUM: 133 mmol/L — AB (ref 135–145)

## 2016-01-20 LAB — I-STAT TROPONIN, ED
Troponin i, poc: 0.01 ng/mL (ref 0.00–0.08)
Troponin i, poc: 0 ng/mL (ref 0.00–0.08)

## 2016-01-20 LAB — PATHOLOGIST SMEAR REVIEW

## 2016-01-20 MED ORDER — HYDROCODONE-ACETAMINOPHEN 5-325 MG PO TABS: 1.0000 | ORAL_TABLET | Freq: Four times a day (QID) | ORAL | Status: DC | PRN

## 2016-01-20 MED ORDER — SODIUM CHLORIDE 0.9 % IV BOLUS (SEPSIS)
500.0000 mL | Freq: Once | INTRAVENOUS | Status: AC
  Administered 2016-01-20: 500 mL via INTRAVENOUS

## 2016-01-20 MED ORDER — GI COCKTAIL ~~LOC~~
30.0000 mL | Freq: Once | ORAL | Status: AC
  Administered 2016-01-20: 30 mL via ORAL
  Filled 2016-01-20: qty 30

## 2016-01-20 MED ORDER — HYDROCODONE-ACETAMINOPHEN 5-325 MG PO TABS
1.0000 | ORAL_TABLET | Freq: Once | ORAL | Status: AC
  Administered 2016-01-20: 1 via ORAL
  Filled 2016-01-20: qty 1

## 2016-01-20 NOTE — ED Provider Notes (Signed)
CSN: [id number]     Arrival date & time 01/20/16  0011 History  By signing my name below, I, Rowan Blase, attest that this documentation has been prepared under the direction and in the presence of Veryl Speak, MD . Electronically Signed: Rowan Blase, Scribe. 01/20/2016. 2:28 AM.   Chief Complaint  Patient presents with  . Back Pain   The history is provided by the patient. No language interpreter was used.   HPI Comments:  Erika Day is a 82 y.o. female with PMHx of HTN, degenerative disc disease, DM, and cancer who presents to the Emergency Department via EMS complaining of intermittent low back pain for the past two weeks, worsening yesterday morning. Pt notes pain radiates to legs and knee. She could not get out of bed due to pain yesterday. No alleviating factors noted or treatments attempted PTA.  Pt also reports upper chest pain onset ~1hr ago while in ED. Relatives report pt had PNA a few weeks ago. Relative also note recent swelling in left wrist; pt was evaluated and swelling was attributed to rheumatism. Pt denies recent fall, nausea, vomiting or diarrhea.  Past Medical History  Diagnosis Date  . Stroke ([place])   . Acid reflux disease   . Panic attacks   . Hypertension   . DDD (degenerative disc disease), lumbar   . Dyslipidemia   . Degenerative joint disease   . Cancer ([place])   . Diabetes mellitus without complication [facility])    Past Surgical History  Procedure Laterality Date  . Cholecystectomy    . Abdominal hysterectomy    . Laparotomy N/A 04/29/2014    Procedure: EXPLORATORY LAPAROTOMY;  Surgeon: Imogene Burn. Georgette Dover, MD;  Location: [place];  Service: General;  Laterality: N/A;  . Ventral hernia repair N/A 04/29/2014    Procedure: PRIMARY REPAIR OF VENTRAL HERNIA;  Surgeon: Imogene Burn. Georgette Dover, MD;  Location: [place];  Service: General;  Laterality: N/A;  . Lysis of adhesion N/A 04/29/2014    Procedure: EXTENSIVE LYSIS OF ADHESIONS;  Surgeon: Imogene Burn. Georgette Dover, MD;   Location: [place];  Service: General;  Laterality: N/A;   History reviewed. No pertinent family history. Social History  Substance Use Topics  . Smoking status: Former Research scientist (life sciences)  . Smokeless tobacco: None  . Alcohol Use: No   OB History    No data available     Review of Systems  Cardiovascular: Positive for chest pain.  Gastrointestinal: Negative for nausea and vomiting.  Musculoskeletal: Positive for back pain.  All other systems reviewed and are negative.   Allergies  Morphine and related  Home Medications   Prior to Admission medications   Medication Sig Start Date End Date Taking? Authorizing Provider  aspirin EC 325 MG tablet Take 325 mg by mouth daily.     Yes Historical Provider, MD  cetirizine (ZYRTEC) 10 MG tablet Take 10 mg by mouth daily as needed. For allergies. 12/23/15  Yes Historical Provider, MD  lisinopril (PRINIVIL,ZESTRIL) 10 MG tablet Take 10 mg by mouth daily.   Yes Historical Provider, MD  timolol (TIMOPTIC) 0.5 % ophthalmic solution Place 1 drop into both eyes 2 (two) times daily. 12/31/15  Yes Historical Provider, MD  levofloxacin (LEVAQUIN) 500 MG tablet Take 1 tablet (500 mg total) by mouth daily. X 7 days Patient not taking: Reported on 01/20/2016 01/02/16   Wandra Arthurs, MD   BP 149/86 mmHg  Pulse 79  Resp 18  SpO2 100% Physical Exam  Constitutional: She appears well-developed  and well-nourished. No distress.  Pt is an elderly female in no acute distress. She appears chronically debilitated.  HENT:  Head: Normocephalic and atraumatic.  Mouth/Throat: Oropharynx is clear and moist. No oropharyngeal exudate.  Eyes: Conjunctivae and EOM are normal. Pupils are equal, round, and reactive to light. Right eye exhibits no discharge. Left eye exhibits no discharge. No scleral icterus.  Neck: Normal range of motion. Neck supple. No JVD present. No thyromegaly present.  Cardiovascular: Normal rate, regular rhythm, normal heart sounds and intact distal pulses.  Exam  reveals no gallop and no friction rub.   No murmur heard. Pulmonary/Chest: Effort normal and breath sounds normal. No respiratory distress. She has no wheezes. She has no rales.  Abdominal: Soft. Bowel sounds are normal. She exhibits no distension and no mass. There is no tenderness.  Musculoskeletal: Normal range of motion. She exhibits no edema.  Lymphadenopathy:    She has no cervical adenopathy.  Neurological: She is alert. Coordination normal.  Skin: Skin is warm and dry. No rash noted. No erythema.  Psychiatric: She has a normal mood and affect. Her behavior is normal.  Nursing note and vitals reviewed.   ED Course  Procedures  DIAGNOSTIC STUDIES:  Oxygen Saturation is 98% on RA, low by my interpretation.    COORDINATION OF CARE:  2:25 AM Will order imaging and administer pain medication. Discussed treatment plan with pt at bedside and pt agreed to plan.  Labs Review Labs Reviewed  CBC WITH DIFFERENTIAL/PLATELET - Abnormal; Notable for the following:    RBC 3.46 (*)    Hemoglobin 9.7 (*)    HCT 30.4 (*)    Platelets 143 (*)    All other components within normal limits  BASIC METABOLIC PANEL  I-STAT TROPOININ, ED    Imaging Review Dg Chest 2 View  01/20/2016  CLINICAL DATA:  Intermittent low back pain for the past 2 weeks, worsening yesterday morning. No trauma. EXAM: CHEST  2 VIEW COMPARISON:  01/02/2016 FINDINGS: There is mild unchanged right hemidiaphragm elevation. Mild linear scarring or atelectasis persists in the bases without significant interval change. No confluent alveolar opacity. No effusion. Normal pulmonary vasculature. Unchanged moderate aortic tortuosity and mild cardiomegaly. IMPRESSION: Mild linear scarring or atelectasis persists in both bases. Unchanged mild cardiomegaly and moderate aortic tortuosity. Electronically Signed   By: Andreas Newport M.D.   On: 01/20/2016 03:07   Dg Thoracic Spine 2 View  01/20/2016  CLINICAL DATA:  Intermittent low back  pain for the past 2 weeks, worsening yesterday morning. EXAM: THORACIC SPINE 2 VIEWS COMPARISON:  None. FINDINGS: There is moderate kyphosis. There is moderate degenerative thoracic disc disease. No bone lesion or bony destruction is evident. Moderate curvature, right convex at T4 and left convex at T10. IMPRESSION: Curvature and degenerative change. Negative for acute thoracic spine fracture. Electronically Signed   By: Andreas Newport M.D.   On: 01/20/2016 03:08   Dg Lumbar Spine 2-3 Views  01/20/2016  CLINICAL DATA:  Intermittent low back pain for the past 2 weeks, worsening yesterday morning. No trauma. EXAM: LUMBAR SPINE - 2-3 VIEW COMPARISON:  None. FINDINGS: There is moderate right convex curvature at L3. The lumbar vertebrae are normal in height. Moderately severe lumbar degenerative disc disease is present. No bone lesion or bony destruction. No evidence of acute fracture. IMPRESSION: Curvature and degenerative changes. Negative for acute lumbar spine fracture. Electronically Signed   By: Andreas Newport M.D.   On: 01/20/2016 03:09   I have personally reviewed  and evaluated these images and lab results as part of my medical decision-making.   EKG Interpretation   Date/Time:  Monday January 20 2016 03:06:07 EDT Ventricular Rate:  83 PR Interval:  258 QRS Duration: 90 QT Interval:  364 QTC Calculation: 428 R Axis:   -10 Text Interpretation:  Sinus rhythm Prolonged PR interval Borderline low  voltage, extremity leads Consider anterior infarct Confirmed by Teyona Nichelson  MD,  Taliyah Watrous ([id number]) on 01/20/2016 3:09:01 AM      MDM   Final diagnoses:  None    Patient presents here with pain in her back and chest that appears to be musculoskeletal in nature. Her x-ray showed no obvious abnormality. EKG and troponin are both negative. She is much more comfortable after taking a hydrocodone here in the ER. I feel as though she is appropriate for discharge. I do not feel as though there are any  indications for admission.  I personally performed the services described in this documentation, which was scribed in my presence. The recorded information has been reviewed and is accurate.       Veryl Speak, MD 01/20/16 [phone]

## 2016-01-20 NOTE — Discharge Instructions (Signed)
Hydrocodone as prescribed as needed for pain.  Follow-up with your primary Dr. in the next week for a recheck, and return to the ER symptoms significantly worsen or change.   Back Pain, Adult Back pain is very common in adults.The cause of back pain is rarely dangerous and the pain often gets better over time.The cause of your back pain may not be known. Some common causes of back pain include:  Strain of the muscles or ligaments supporting the spine.  Wear and tear (degeneration) of the spinal disks.  Arthritis.  Direct injury to the back. For many people, back pain may return. Since back pain is rarely dangerous, most people can learn to manage this condition on their own. HOME CARE INSTRUCTIONS Watch your back pain for any changes. The following actions may help to lessen any discomfort you are feeling:  Remain active. It is stressful on your back to sit or stand in one place for long periods of time. Do not sit, drive, or stand in one place for more than 30 minutes at a time. Take short walks on even surfaces as soon as you are able.Try to increase the length of time you walk each day.  Exercise regularly as directed by your health care provider. Exercise helps your back heal faster. It also helps avoid future injury by keeping your muscles strong and flexible.  Do not stay in bed.Resting more than 1-2 days can delay your recovery.  Pay attention to your body when you bend and lift. The most comfortable positions are those that put less stress on your recovering back. Always use proper lifting techniques, including:  Bending your knees.  Keeping the load close to your body.  Avoiding twisting.  Find a comfortable position to sleep. Use a firm mattress and lie on your side with your knees slightly bent. If you lie on your back, put a pillow under your knees.  Avoid feeling anxious or stressed.Stress increases muscle tension and can worsen back pain.It is important to  recognize when you are anxious or stressed and learn ways to manage it, such as with exercise.  Take medicines only as directed by your health care provider. Over-the-counter medicines to reduce pain and inflammation are often the most helpful.Your health care provider may prescribe muscle relaxant drugs.These medicines help dull your pain so you can more quickly return to your normal activities and healthy exercise.  Apply ice to the injured area:  Put ice in a plastic bag.  Place a towel between your skin and the bag.  Leave the ice on for 20 minutes, 2-3 times a day for the first 2-3 days. After that, ice and heat may be alternated to reduce pain and spasms.  Maintain a healthy weight. Excess weight puts extra stress on your back and makes it difficult to maintain good posture. SEEK MEDICAL CARE IF:  You have pain that is not relieved with rest or medicine.  You have increasing pain going down into the legs or buttocks.  You have pain that does not improve in one week.  You have night pain.  You lose weight.  You have a fever or chills. SEEK IMMEDIATE MEDICAL CARE IF:   You develop new bowel or bladder control problems.  You have unusual weakness or numbness in your arms or legs.  You develop nausea or vomiting.  You develop abdominal pain.  You feel faint.   This information is not intended to replace advice given to you by your health  care provider. Make sure you discuss any questions you have with your health care provider.   Document Released: 10/19/2005 Document Revised: 11/09/2014 Document Reviewed: 02/20/2014 Elsevier Interactive Patient Education [employer].

## 2016-01-20 NOTE — ED Notes (Signed)
Pt brought in by EMS with c/o low back pain x 2 weeks  Pt went to get up into the wheelchair this morning and the pain hit her instantly  Pt has not been up all day  Pt has not had any relief all day

## 2016-01-31 ENCOUNTER — Ambulatory Visit (INDEPENDENT_AMBULATORY_CARE_PROVIDER_SITE_OTHER): Payer: Medicaid Other | Admitting: Internal Medicine

## 2016-01-31 ENCOUNTER — Ambulatory Visit: Payer: Self-pay | Admitting: Internal Medicine

## 2016-01-31 ENCOUNTER — Encounter: Payer: Self-pay | Admitting: Internal Medicine

## 2016-01-31 VITALS — BP 110/68 | HR 59 | Ht 64.0 in

## 2016-01-31 DIAGNOSIS — M150 Primary generalized (osteo)arthritis: Secondary | ICD-10-CM | POA: Diagnosis not present

## 2016-01-31 DIAGNOSIS — M1990 Unspecified osteoarthritis, unspecified site: Secondary | ICD-10-CM | POA: Insufficient documentation

## 2016-01-31 DIAGNOSIS — M40204 Unspecified kyphosis, thoracic region: Secondary | ICD-10-CM | POA: Diagnosis not present

## 2016-01-31 DIAGNOSIS — I10 Essential (primary) hypertension: Secondary | ICD-10-CM

## 2016-01-31 DIAGNOSIS — E1165 Type 2 diabetes mellitus with hyperglycemia: Secondary | ICD-10-CM | POA: Diagnosis not present

## 2016-01-31 DIAGNOSIS — L89159 Pressure ulcer of sacral region, unspecified stage: Secondary | ICD-10-CM | POA: Diagnosis not present

## 2016-01-31 DIAGNOSIS — Z7409 Other reduced mobility: Secondary | ICD-10-CM

## 2016-01-31 DIAGNOSIS — M159 Polyosteoarthritis, unspecified: Secondary | ICD-10-CM

## 2016-01-31 DIAGNOSIS — H6591 Unspecified nonsuppurative otitis media, right ear: Secondary | ICD-10-CM

## 2016-01-31 DIAGNOSIS — IMO0001 Reserved for inherently not codable concepts without codable children: Secondary | ICD-10-CM

## 2016-01-31 NOTE — Progress Notes (Signed)
Subjective:    Patient ID: Erika Day, female    DOB: [birth year], 82 y.o.   MRN: [id number]  HPI   1.  Bad back, arthritis pain (hands/wrists, knees):  Pt. States has bad bed and [employer] reportedly working on replacing.  Takes Hydrocodone/Acetominophen only when needed up to twice daily.  2.  Recent UTI:  Treated with Levaquin at beginning of this month.  No longer with urinary frequency and dysuria.  3.  Essential Hypertension:  Has not been taking Lisinopril 10 mg daily for some time.  Admits to not taking her meds as she should.  Not taking her Verapamil CR (that's what she describes) she thinks 120 mg as the pill is too big.  When she has taken it in the past, she cuts in half, even though she knows she should not.    4.  Eye pain:  Followed by Dr. Katy Fitch.  Was no Timolol for Glaucoma, but not clear she is taking now.  Currently only using her Prednisolone eye drops.  States she is not good about using her eye drops.  Has troubles getting the eyedrops in.    5.  Right ear with drainage:  Went to [facility] 2 (March 29th) days ago.  Was having right ear pain and fever.  Started on Augmentin, but has only taken 1 tablet thus far.  Has yellow drainage currently.  Pain better and fever gone now.   6.  History of "light stroke"  When 82 years old in the 56s.  Patient at the time had diagnosis of hypertension and Diabetes.  Describes eating a terrible diet of chips, 4 L of soda per day, 6 eggs with mayo.  Stopped colas, but now drinking Ginger ale.  Does not drink water from her faucet as is rusty.  Has not tried a filtered water, but would try that if we could help support moving toward that.  7.  ?History of cancer:  Have multiple myeloma and ovarian cancer both listed.  Pt. Not aware of either--need more recent records.   Current outpatient prescriptions:  .  amoxicillin-clavulanate (AUGMENTIN) 875-125 MG tablet, Take 1 tablet by mouth 2 (two) times daily., Disp:  , Rfl:  .  aspirin EC 325 MG tablet, Take 325 mg by mouth daily.  , Disp: , Rfl:  .  cetirizine (ZYRTEC) 10 MG tablet, Take 10 mg by mouth daily as needed. For allergies., Disp: , Rfl: 2 .  HYDROcodone-acetaminophen (NORCO) 5-325 MG tablet, Take 1 tablet by mouth every 6 (six) hours as needed., Disp: 20 tablet, Rfl: 0 .  pantoprazole (PROTONIX) 40 MG tablet, Take 40 mg by mouth daily., Disp: , Rfl:  .  prednisoLONE acetate (PRED FORTE) 1 % ophthalmic suspension, 2 drops 4 (four) times daily. 2 drops every 4 hours in right eye and 2 drops twice daily in left eye, Disp: , Rfl:  .  verapamil (CALAN-SR) 120 MG CR tablet, Take 120 mg by mouth at bedtime., Disp: , Rfl:  .  levofloxacin (LEVAQUIN) 500 MG tablet, Take 1 tablet (500 mg total) by mouth daily. X 7 days (Patient not taking: Reported on 01/20/2016), Disp: 7 tablet, Rfl: 0 .  lisinopril (PRINIVIL,ZESTRIL) 10 MG tablet, Take 10 mg by mouth daily. Reported on 01/31/2016, Disp: , Rfl:  .  timolol (TIMOPTIC) 0.5 % ophthalmic solution, Place 1 drop into both eyes 2 (two) times daily., Disp: , Rfl:    Allergies  Allergen Reactions  . Morphine And  Related Other (See Comments)    Blood sugar dropped one-time    Family History  Problem Relation Age of Onset  . Hypertension Mother   . Stroke Mother   . Arthritis Mother   . Alcohol abuse Father   No siblings and no biologic children  Social History   Social History  . Marital Status: Married    Spouse Name: Roena Sassaman  . Number of Children: 0  . Years of Education: N/A   Occupational History  . Retired housewife    Social History Main Topics  . Smoking status: Former Smoker    Types: Cigarettes    Quit date: 02/18/1990  . Smokeless tobacco: Former Systems developer    Types: Snuff    Quit date: 02/18/1990  . Alcohol Use: No     Comment: Only drank alcohol between ages of 16 and 21 yo.  Her father gave it to her  . Drug Use: No  . Sexual Activity: Not on file   Other Topics Concern  . Not  on file   Social History Narrative   Lives at home with her husband, [name], her "adopted daugher, [name]", and [name]'s little 80 yo boy.   Has been in a wheelchair because she kept falling--in wheelchair for about 10 years.   Sometimes gets up and holds onto furniture or wall to get around.     Did have a walker, but fell with that.  At one point had PT   Does not have any biologic children, but has cared for other family members' children through childhood   [name] is the granddaughter of her 6rd cousin.  She raised [name]'s mother, [name] as well.     64 yo son of [name] also lives in their home.     He is in daycare and his great grandmother watches him when she is at work.   Has brought up about 7 children over past 50 years.           Review of Systems     Objective:   Physical Exam NAD Able to transfer to low bed independently from wheelchair, though slow going.   Obvious Kyphosis  HEENT:  Arcus senilis bilaterally, PERRL, EOMI, unable to see right TM due to beige discharge from right ear.  Mild tenderness with manipulation of right pinna.  Edentulous.  No teeth with receding gingiva upper jaw, few decayed teeth with severely receding gingiva in lower jaw. Neck:  Supple, no adenopathy Chest:  CTA, resonant to percussion CV: RRR with Grade II-III/VI SEM left upper sternum radiating to R sternal area and somewhat to bilateral carotids.  No carotid bruit appreciated, Carotid upstroke is normal and equal to radial pulses bilaterally. DP pulses mildly decreased bilaterally. Abd: S, NT, No HSM or mass, protuberant +BS Skin:  Superficial breakdown of skin at upper fold between buttocks.  No surrounding erythema.      Assessment & Plan:  1.  Decreased mobility:  Discuss with HOT regarding PACE as an option for patient to get more intense PT and perhaps get her on her meds regularly so she can see how much better she feels when her health issues are controlled.  2.   Essential Hypertension:  Not taking meds.  Encouraged patient to get started back to avoid stroke/MI.  Will call pharmacy and confirm her medications availability  3.  ?DM:  Pt. Does not believe she currently carries this diagnosis, but suspect she does--check records.  Elevated sugars in old labs,  but no A1C. Did not get her to sign release for [facility].  Will have HOT get that done.  4.  Right OM with likely perforation and drainage.  Encouraged pt. To take her Augmentin appropriately.    5.  Sacral Skin breakdown:  Basically Stage I. Look into lumbar support pillow.  Will have HOT RN check her home for previous pillow and make recommendations.  6.  Eye concerns:  Need records from Dr. Katy Fitch to know what she should be using for her eyes.  7.  Large intake of sodas:  Will look into water filter for patient and family to decrease use of soda.

## 2016-01-31 NOTE — Patient Instructions (Signed)
F

## 2016-02-01 ENCOUNTER — Encounter: Payer: Self-pay | Admitting: Internal Medicine

## 2016-02-01 DIAGNOSIS — M40209 Unspecified kyphosis, site unspecified: Secondary | ICD-10-CM | POA: Insufficient documentation

## 2016-02-01 DIAGNOSIS — J189 Pneumonia, unspecified organism: Secondary | ICD-10-CM

## 2016-02-01 HISTORY — DX: Pneumonia, unspecified organism: J18.9

## 2016-02-06 ENCOUNTER — Ambulatory Visit: Payer: Self-pay | Admitting: Internal Medicine

## 2016-02-10 ENCOUNTER — Ambulatory Visit: Payer: Self-pay | Admitting: Internal Medicine

## 2016-02-12 ENCOUNTER — Emergency Department (HOSPITAL_COMMUNITY): Payer: Medicare Other

## 2016-02-12 ENCOUNTER — Encounter (HOSPITAL_COMMUNITY): Payer: Self-pay | Admitting: *Deleted

## 2016-02-12 DIAGNOSIS — Z993 Dependence on wheelchair: Secondary | ICD-10-CM

## 2016-02-12 DIAGNOSIS — M5134 Other intervertebral disc degeneration, thoracic region: Secondary | ICD-10-CM | POA: Diagnosis present

## 2016-02-12 DIAGNOSIS — R05 Cough: Secondary | ICD-10-CM | POA: Diagnosis not present

## 2016-02-12 DIAGNOSIS — M5136 Other intervertebral disc degeneration, lumbar region: Secondary | ICD-10-CM | POA: Diagnosis present

## 2016-02-12 DIAGNOSIS — I5032 Chronic diastolic (congestive) heart failure: Secondary | ICD-10-CM | POA: Diagnosis present

## 2016-02-12 DIAGNOSIS — J159 Unspecified bacterial pneumonia: Principal | ICD-10-CM | POA: Diagnosis present

## 2016-02-12 DIAGNOSIS — E785 Hyperlipidemia, unspecified: Secondary | ICD-10-CM | POA: Diagnosis present

## 2016-02-12 DIAGNOSIS — D619 Aplastic anemia, unspecified: Secondary | ICD-10-CM | POA: Diagnosis present

## 2016-02-12 DIAGNOSIS — Z87891 Personal history of nicotine dependence: Secondary | ICD-10-CM

## 2016-02-12 DIAGNOSIS — D696 Thrombocytopenia, unspecified: Secondary | ICD-10-CM | POA: Diagnosis present

## 2016-02-12 DIAGNOSIS — E871 Hypo-osmolality and hyponatremia: Secondary | ICD-10-CM | POA: Diagnosis present

## 2016-02-12 DIAGNOSIS — I110 Hypertensive heart disease with heart failure: Secondary | ICD-10-CM | POA: Diagnosis present

## 2016-02-12 DIAGNOSIS — E1165 Type 2 diabetes mellitus with hyperglycemia: Secondary | ICD-10-CM | POA: Diagnosis present

## 2016-02-12 DIAGNOSIS — Z7982 Long term (current) use of aspirin: Secondary | ICD-10-CM

## 2016-02-12 DIAGNOSIS — Z9119 Patient's noncompliance with other medical treatment and regimen: Secondary | ICD-10-CM

## 2016-02-12 NOTE — ED Notes (Signed)
Pt has a recent history of pneumonia. States that she misread the label to her antibiotics and has been taking them once a day instead of twice a day.

## 2016-02-12 NOTE — ED Notes (Signed)
Pt c/o nasal congestion, dry cough and left ear pain.

## 2016-02-13 ENCOUNTER — Inpatient Hospital Stay (HOSPITAL_COMMUNITY)
Admission: EM | Admit: 2016-02-13 | Discharge: 2016-02-15 | DRG: 194 | Disposition: A | Discharge status: Home / Self Care | Payer: Medicare Other | Attending: Family Medicine | Admitting: Family Medicine

## 2016-02-13 ENCOUNTER — Inpatient Hospital Stay (HOSPITAL_COMMUNITY): Payer: Medicare Other

## 2016-02-13 ENCOUNTER — Encounter (HOSPITAL_COMMUNITY): Payer: Self-pay | Admitting: Family Medicine

## 2016-02-13 DIAGNOSIS — D638 Anemia in other chronic diseases classified elsewhere: Secondary | ICD-10-CM | POA: Diagnosis present

## 2016-02-13 DIAGNOSIS — M5136 Other intervertebral disc degeneration, lumbar region: Secondary | ICD-10-CM | POA: Diagnosis present

## 2016-02-13 DIAGNOSIS — D619 Aplastic anemia, unspecified: Secondary | ICD-10-CM | POA: Diagnosis present

## 2016-02-13 DIAGNOSIS — D696 Thrombocytopenia, unspecified: Secondary | ICD-10-CM | POA: Diagnosis present

## 2016-02-13 DIAGNOSIS — I10 Essential (primary) hypertension: Secondary | ICD-10-CM | POA: Diagnosis present

## 2016-02-13 DIAGNOSIS — M5134 Other intervertebral disc degeneration, thoracic region: Secondary | ICD-10-CM | POA: Diagnosis present

## 2016-02-13 DIAGNOSIS — Z87891 Personal history of nicotine dependence: Secondary | ICD-10-CM | POA: Diagnosis not present

## 2016-02-13 DIAGNOSIS — Z7982 Long term (current) use of aspirin: Secondary | ICD-10-CM | POA: Diagnosis not present

## 2016-02-13 DIAGNOSIS — E871 Hypo-osmolality and hyponatremia: Secondary | ICD-10-CM | POA: Diagnosis present

## 2016-02-13 DIAGNOSIS — E1165 Type 2 diabetes mellitus with hyperglycemia: Secondary | ICD-10-CM

## 2016-02-13 DIAGNOSIS — R05 Cough: Secondary | ICD-10-CM | POA: Diagnosis present

## 2016-02-13 DIAGNOSIS — Z993 Dependence on wheelchair: Secondary | ICD-10-CM | POA: Diagnosis not present

## 2016-02-13 DIAGNOSIS — J189 Pneumonia, unspecified organism: Secondary | ICD-10-CM | POA: Diagnosis not present

## 2016-02-13 DIAGNOSIS — D649 Anemia, unspecified: Secondary | ICD-10-CM | POA: Diagnosis not present

## 2016-02-13 DIAGNOSIS — J159 Unspecified bacterial pneumonia: Secondary | ICD-10-CM | POA: Diagnosis present

## 2016-02-13 DIAGNOSIS — Z9119 Patient's noncompliance with other medical treatment and regimen: Secondary | ICD-10-CM | POA: Diagnosis not present

## 2016-02-13 DIAGNOSIS — IMO0002 Reserved for concepts with insufficient information to code with codable children: Secondary | ICD-10-CM | POA: Diagnosis present

## 2016-02-13 DIAGNOSIS — R059 Cough, unspecified: Secondary | ICD-10-CM

## 2016-02-13 DIAGNOSIS — I5032 Chronic diastolic (congestive) heart failure: Secondary | ICD-10-CM | POA: Diagnosis present

## 2016-02-13 DIAGNOSIS — E785 Hyperlipidemia, unspecified: Secondary | ICD-10-CM | POA: Diagnosis present

## 2016-02-13 DIAGNOSIS — I110 Hypertensive heart disease with heart failure: Secondary | ICD-10-CM | POA: Diagnosis present

## 2016-02-13 HISTORY — DX: Pneumonia, unspecified organism: J18.9

## 2016-02-13 LAB — CBC WITH DIFFERENTIAL/PLATELET
BASOS ABS: 0 10*3/uL (ref 0.0–0.1)
BASOS PCT: 0 %
EOS ABS: 0 10*3/uL (ref 0.0–0.7)
EOS PCT: 0 %
HCT: 29 % — ABNORMAL LOW (ref 36.0–46.0)
Hemoglobin: 9.1 g/dL — ABNORMAL LOW (ref 12.0–15.0)
LYMPHS ABS: 4.5 10*3/uL — AB (ref 0.7–4.0)
Lymphocytes Relative: 73 %
MCH: 26.8 pg (ref 26.0–34.0)
MCHC: 31.4 g/dL (ref 30.0–36.0)
MCV: 85.3 fL (ref 78.0–100.0)
Monocytes Absolute: 0.1 10*3/uL (ref 0.1–1.0)
Monocytes Relative: 2 %
Neutro Abs: 1.5 10*3/uL — ABNORMAL LOW (ref 1.7–7.7)
Neutrophils Relative %: 25 %
PLATELETS: 127 10*3/uL — AB (ref 150–400)
RBC: 3.4 MIL/uL — AB (ref 3.87–5.11)
RDW: 13.9 % (ref 11.5–15.5)
WBC: 6.1 10*3/uL (ref 4.0–10.5)

## 2016-02-13 LAB — CBC
HEMATOCRIT: 28.4 % — AB (ref 36.0–46.0)
Hemoglobin: 9.3 g/dL — ABNORMAL LOW (ref 12.0–15.0)
MCH: 28 pg (ref 26.0–34.0)
MCHC: 32.7 g/dL (ref 30.0–36.0)
MCV: 85.5 fL (ref 78.0–100.0)
Platelets: 131 10*3/uL — ABNORMAL LOW (ref 150–400)
RBC: 3.32 MIL/uL — ABNORMAL LOW (ref 3.87–5.11)
RDW: 13.9 % (ref 11.5–15.5)
WBC: 6 10*3/uL (ref 4.0–10.5)

## 2016-02-13 LAB — I-STAT CG4 LACTIC ACID, ED: Lactic Acid, Venous: 0.74 mmol/L (ref 0.5–2.0)

## 2016-02-13 LAB — BASIC METABOLIC PANEL
ANION GAP: 9 (ref 5–15)
BUN: 7 mg/dL (ref 6–20)
CO2: 24 mmol/L (ref 22–32)
Calcium: 9.2 mg/dL (ref 8.9–10.3)
Chloride: 99 mmol/L — ABNORMAL LOW (ref 101–111)
Creatinine, Ser: 0.77 mg/dL (ref 0.44–1.00)
GFR calc Af Amer: >60 mL/min (ref >60)
GLUCOSE: 77 mg/dL (ref 65–99)
POTASSIUM: 4.4 mmol/L (ref 3.5–5.1)
Sodium: 132 mmol/L — ABNORMAL LOW (ref 135–145)

## 2016-02-13 LAB — OSMOLALITY: OSMOLALITY: 275 mosm/kg (ref 275–295)

## 2016-02-13 LAB — PROCALCITONIN: Procalcitonin: <0.1 ng/mL

## 2016-02-13 LAB — FERRITIN: Ferritin: 97 ng/mL (ref 11–307)

## 2016-02-13 LAB — RETICULOCYTES
RBC.: 3.32 MIL/uL — AB (ref 3.87–5.11)
RETIC CT PCT: 0.5 % (ref 0.4–3.1)
Retic Count, Absolute: 16.6 10*3/uL — ABNORMAL LOW (ref 19.0–186.0)

## 2016-02-13 LAB — CREATININE, SERUM: Creatinine, Ser: 0.8 mg/dL (ref 0.44–1.00)

## 2016-02-13 LAB — BRAIN NATRIURETIC PEPTIDE: B NATRIURETIC PEPTIDE 5: 443.6 pg/mL — AB (ref 0.0–100.0)

## 2016-02-13 LAB — VITAMIN B12: VITAMIN B 12: 368 pg/mL (ref 180–914)

## 2016-02-13 MED ORDER — ASPIRIN EC 325 MG PO TBEC
325.0000 mg | DELAYED_RELEASE_TABLET | Freq: Every day | ORAL | Status: DC
  Administered 2016-02-13 – 2016-02-15 (×3): 325 mg via ORAL
  Filled 2016-02-13 (×3): qty 1

## 2016-02-13 MED ORDER — CEFTRIAXONE SODIUM 1 G IJ SOLR
1.0000 g | Freq: Once | INTRAMUSCULAR | Status: AC
  Administered 2016-02-13: 1 g via INTRAVENOUS
  Filled 2016-02-13: qty 10

## 2016-02-13 MED ORDER — ENOXAPARIN SODIUM 40 MG/0.4ML ~~LOC~~ SOLN
40.0000 mg | SUBCUTANEOUS | Status: DC
  Administered 2016-02-13 – 2016-02-15 (×3): 40 mg via SUBCUTANEOUS
  Filled 2016-02-13 (×3): qty 0.4

## 2016-02-13 MED ORDER — SENNOSIDES-DOCUSATE SODIUM 8.6-50 MG PO TABS: 1.0000 | ORAL_TABLET | Freq: Every evening | ORAL | Status: DC | PRN

## 2016-02-13 MED ORDER — DEXTROSE 5 % IV SOLN: 1.0000 g | INTRAVENOUS | Status: DC

## 2016-02-13 MED ORDER — ONDANSETRON HCL 4 MG PO TABS: 4.0000 mg | ORAL_TABLET | Freq: Four times a day (QID) | ORAL | Status: DC | PRN

## 2016-02-13 MED ORDER — PIPERACILLIN-TAZOBACTAM 3.375 G IVPB
3.3750 g | Freq: Three times a day (TID) | INTRAVENOUS | Status: DC
  Administered 2016-02-13 – 2016-02-14 (×4): 3.375 g via INTRAVENOUS
  Filled 2016-02-13 (×6): qty 50

## 2016-02-13 MED ORDER — AZITHROMYCIN 250 MG PO TABS: 500.0000 mg | ORAL_TABLET | ORAL | Status: DC

## 2016-02-13 MED ORDER — HYDROCODONE-ACETAMINOPHEN 5-325 MG PO TABS: 1.0000 | ORAL_TABLET | Freq: Four times a day (QID) | ORAL | Status: DC | PRN

## 2016-02-13 MED ORDER — ACETAMINOPHEN 650 MG RE SUPP: 650.0000 mg | Freq: Four times a day (QID) | RECTAL | Status: DC | PRN

## 2016-02-13 MED ORDER — ACETAMINOPHEN 325 MG PO TABS: 650.0000 mg | ORAL_TABLET | Freq: Four times a day (QID) | ORAL | Status: DC | PRN

## 2016-02-13 MED ORDER — FLUTICASONE PROPIONATE 50 MCG/ACT NA SUSP
1.0000 | Freq: Every day | NASAL | Status: DC
  Administered 2016-02-13 – 2016-02-15 (×3): 1 via NASAL
  Filled 2016-02-13: qty 16

## 2016-02-13 MED ORDER — LORATADINE 10 MG PO TABS
10.0000 mg | ORAL_TABLET | Freq: Every day | ORAL | Status: DC
  Administered 2016-02-13 – 2016-02-15 (×3): 10 mg via ORAL
  Filled 2016-02-13 (×3): qty 1

## 2016-02-13 MED ORDER — VERAPAMIL HCL ER 240 MG PO TBCR
120.0000 mg | EXTENDED_RELEASE_TABLET | Freq: Every day | ORAL | Status: DC
  Administered 2016-02-13 – 2016-02-14 (×2): 120 mg via ORAL
  Filled 2016-02-13 (×2): qty 0.5

## 2016-02-13 MED ORDER — FUROSEMIDE 20 MG PO TABS: 20.0000 mg | ORAL_TABLET | Freq: Every day | ORAL | Status: DC | PRN

## 2016-02-13 MED ORDER — DEXTROSE 5 % IV SOLN
500.0000 mg | Freq: Once | INTRAVENOUS | Status: AC
  Administered 2016-02-13: 500 mg via INTRAVENOUS
  Filled 2016-02-13: qty 500

## 2016-02-13 MED ORDER — LISINOPRIL 10 MG PO TABS
10.0000 mg | ORAL_TABLET | Freq: Every day | ORAL | Status: DC
  Administered 2016-02-13 – 2016-02-15 (×2): 10 mg via ORAL
  Filled 2016-02-13 (×3): qty 1

## 2016-02-13 MED ORDER — ONDANSETRON HCL 4 MG/2ML IJ SOLN: 4.0000 mg | Freq: Four times a day (QID) | INTRAMUSCULAR | Status: DC | PRN

## 2016-02-13 MED ORDER — PANTOPRAZOLE SODIUM 40 MG PO TBEC
40.0000 mg | DELAYED_RELEASE_TABLET | Freq: Every day | ORAL | Status: DC
  Administered 2016-02-13 – 2016-02-15 (×3): 40 mg via ORAL
  Filled 2016-02-13 (×3): qty 1

## 2016-02-13 NOTE — Progress Notes (Signed)
Pharmacy Antibiotic Note  Erika Day is a 82 y.o. female admitted on 02/13/2016 with aspiration pneumonia.  Pharmacy has been consulted for Zosyn dosing.  Plan: 82yo female presenting with cough/malaise off & on for the last 2 months.  She has had courses of Levofloxacin and Augmentin during this time period.  She received Ceftriaxone and Azithromycin in the ED, but now is to change to Zosyn.  CT (+)multifocal pna, no empyema/effusion.  Sputum culture has been ordered.   1-  Zosyn 3.375g IV q8, infusing over 4hr 2-  F/U culture results and watch renal function  Height: 5\' 2"  (157.5 cm) Weight: 138 lb 3.2 oz (62.687 kg) IBW/kg (Calculated) : 50.1  Temp (24hrs), Avg:98.8 F (37.1 C), Min:97.1 F (36.2 C), Max:100.2 F (37.9 C)   Recent Labs Lab 02/13/16 0350 02/13/16 0356 02/13/16 0741  WBC 6.1  --  6.0  CREATININE 0.77  --  0.80  LATICACIDVEN  --  0.74  --     Estimated Creatinine Clearance: 46.3 mL/min (by C-G formula based on Cr of 0.8).    Allergies  Allergen Reactions  . Morphine And Related Other (See Comments)    Blood sugar dropped one-time    Antimicrobials this admission: 4/13 Ceftriaxone x 1 4/13 Azithromycin x 1 Zosyn 4/13 >>    Microbiology results: 4/13 Sputum:  Thank you for allowing pharmacy to be a part of this patient's care.   Gracy Bruins, PharmD Clinical Pharmacist [facility]

## 2016-02-13 NOTE — Progress Notes (Signed)
Patient asking for an allergy medication and nasal spray for congestion. MD paged

## 2016-02-13 NOTE — H&P (Signed)
History and Physical  Patient Name: Erika Day     [id number]    DOB: September 14, 1930    DOA: 02/13/2016 Referring physician: Malvin Johns, MD PCP: Philis Fendt, MD      Chief Complaint: Cough and malaise  HPI: Erika Day is a 82 y.o. female with a past medical history significant for HTN, wheelchair-bound who presents with cough on and off for 2 months.  The current episode began in mid February, when the patient developed cough and myalgias, went to her PCP, and was prescribed Augmentin (either for ear infection or bronchitis, family is not sure). This did not improve the patient's symptoms, and so she came to the ER 2 weeks later with fever/leukocytosis, and was given Levaquin for CAP.  This prescription may have improved symptoms somewhat for a week or two, but by the end of March she had symptoms again, went back to [facility]/Dr. Avbuere and was given Augmentin for ear infection.  Follow up note at a different clinic on 3/31 suggests that she had "beige discharge from right ear" around that time.  Per family, she has a lot of pills left over, and was probably only taking one Augmentin tablet per day or fewer.  Now, she just feels that over the last two weeks her symptoms are no better.  She still has wet cough, fatigue, congestion, ear pain on the RIGHT.  Daughter/niece reports fever.  She is sleeping more.  She has had no dyspnea, confusion, increased work of breathing, hemoptysis.  She has had swelling, hard to put shoes on.  Possibly nocturnal dyspnea, she has chronic orthopnea.  In the ED, she had a low grade fever, and was hemodynamically stable without hypoxia.  CXR was read as RLL opacity, although this is disputable.  Na 132, K 4.4, Cr 0.8, WBC 6K, Hgb 9.1 normocytic, platelets 127K.  She was given ceftriaxone azithromycin for CAP failed outpatient therapy, and TRH were asked to evaluate for admission.     Review of Systems:  All other systems negative except as just  noted or noted in the history of present illness.  Allergies  Allergen Reactions  . Morphine And Related Other (See Comments)    Blood sugar dropped one-time    Prior to Admission medications   Medication Sig Start Date End Date Taking? Authorizing Provider  aspirin EC 325 MG tablet Take 325 mg by mouth daily.     Yes Historical Provider, MD  cetirizine (ZYRTEC) 10 MG tablet Take 10 mg by mouth daily as needed for allergies. For allergies. 12/23/15  Yes Historical Provider, MD  furosemide (LASIX) 20 MG tablet Take 20 mg by mouth daily as needed for fluid.   Yes Historical Provider, MD  HYDROcodone-acetaminophen (NORCO) 5-325 MG tablet Take 1 tablet by mouth every 6 (six) hours as needed. Patient taking differently: Take 1 tablet by mouth every 6 (six) hours as needed for moderate pain.  01/20/16  Yes Veryl Speak, MD  lisinopril (PRINIVIL,ZESTRIL) 10 MG tablet Take 10 mg by mouth daily. Reported on 01/31/2016   Yes Historical Provider, MD  ofloxacin (FLOXIN) 0.3 % otic solution Place 3 drops into the left ear daily.   Yes Historical Provider, MD  pantoprazole (PROTONIX) 40 MG tablet Take 40 mg by mouth daily.   Yes Historical Provider, MD  prednisoLONE acetate (PRED FORTE) 1 % ophthalmic suspension Place 1-2 drops into both eyes See admin instructions. 2 drops in right eye and 1 drop in the left eye  twice daily   Yes Historical Provider, MD  verapamil (CALAN-SR) 120 MG CR tablet Take 120 mg by mouth at bedtime.   Yes Historical Provider, MD    Past Medical History  Diagnosis Date  . Acid reflux disease   . Panic attacks   . Hypertension   . DDD (degenerative disc disease), lumbar   . Dyslipidemia   . Degenerative joint disease   . Diabetes mellitus without complication ([place])   . Stroke [facility]) age 23 yo    poorly controlled DM and Hypertension  . Cancer [facility])     Looking for documentation of this    Past Surgical History  Procedure Laterality Date  . Laparotomy N/A 04/29/2014     Procedure: EXPLORATORY LAPAROTOMY;  Surgeon: Imogene Burn. Georgette Dover, MD;  Location: [place];  Service: General;  Laterality: N/A;  . Ventral hernia repair N/A 04/29/2014    Procedure: PRIMARY REPAIR OF VENTRAL HERNIA;  Surgeon: Imogene Burn. Georgette Dover, MD;  Location: [place];  Service: General;  Laterality: N/A;  . Lysis of adhesion N/A 04/29/2014    Procedure: EXTENSIVE LYSIS OF ADHESIONS;  Surgeon: Imogene Burn. Georgette Dover, MD;  Location: [place];  Service: General;  Laterality: N/A;  . Cholecystectomy    . Abdominal hysterectomy  ? 08/25/2011 ?    Has BSO for left benign ovarian tumor with torsion and broad ligament benign tumor, but does not appear had hysterectomy--[facility], NOT ovarian cancer    Family history: family history includes Alcohol abuse in her father; Arthritis in her mother; Hypertension in her mother; Stroke in her mother.  Social History: Patient lives with her husband who is POA, as well as niece and niece's son.  She is a remote former smoker.  She is confined to wheelchair at home. However she is able to perform all basic ADLs, uses a bedside commode by herself, and her family is able to cut typically from her wheelchair.       Physical Exam: BP 144/77 mmHg  Pulse 80  Temp(Src) 99.2 F (37.3 C) (Oral)  Resp 20  SpO2 93% General appearance: Thin elderly adult female, alert and in no acute distress.  Tired.   Eyes: Anicteric, conjunctiva pink, lids and lashes normal.     ENT: No nasal deformity, discharge, or epistaxis.  OP moist without lesions.  Poor dentition.  There is dried clearish discharge from the right ear.  The TM has no visible perforation that I can see.  The ear and pinna are not painful to palpation. Lymph: No cervical or supraclavicular lymphadenopathy. Skin: Warm and dry.  No rashes. Cardiac: RRR, nl S1-S2, early SEM at base.  Capillary refill is brisk.  No JVD.  Pretibial edema.  Radial and DP pulses 2+ and symmetric. Respiratory: Normal respiratory rate and rhythm.  Rattling  cough.  No wheezes.  No focal rales appreciated. Abdomen: Abdomen soft without rigidity.  No TTP. No ascites, distension.   MSK: No deformities or effusions. Neuro: Sensorium intact and responding to questions, attention normal.  Speech is fluent.  Moves all extremities equally and with normal coordination but globally weak.  Cranial nerves normal.    Psych: Behavior appropriate.  Affect blunted.  No evidence of aural or visual hallucinations or delusions.       Labs on Admission:  The metabolic panel shows mild hyponatremia, chronic. Lactate normal. The complete blood count shows settings normocytic anemia, mild thrombocytopenia, no leukocytosis.   Radiological Exams on Admission: Personally reviewed: Dg Chest 2 View 02/12/2016 "IMPRESSION:  Patchy infiltrate right base.  Lungs elsewhere clear. Followup PA and lateral chest radiographs recommended in 3-4 weeks following trial of antibiotic therapy to ensure resolution and exclude underlying malignancy."  The elevated hemidiaphragm is clear, but patchy opacity is not clearly present to me.       Assessment/Plan 1. Pneumonia:  This is new.  Bacterial pneumonia is suspected, just undertreated.  However, given that she has had now two rounds of Augmentin and a round of Levaquin without resolution, differential should be broadened to include recurrent aspiration, chronic sinusitis (to explain persistent ear drainage), but also non infectious things like lung cancer, CHF.   -Continue ceftriaxone and azithromycin for now -Obtain CT chest now -Check procalcitonin -Check BNP  -Sputum assessment, gram stain and culture, S pneumo antigen -IS -Check intake and output -If BNP elevated, will obtain echo   2. Hyponatremia:  Seems hypervolemic. -Check urine sodium and free water clearance  3. Anemia:  Slowly progressive per chart review. -Check iron studies, B12, folate, and retics  4. HTN and hx of vascular disease:  Slightly  hypertensive. -Continue verapamil and lisinopril and aspirin  5. Presumed chronic diastolic CHF:  No previous echo in our system. -Continue antihypertensives and home furosemide  6. Thrombocytopenia:  Unclear etiology  7. Ear discharge: The canal does not seem particularly painful, so I assume this is a perforated TM and draining otitis.       DVT PPx: Lovenox Diet: Regular Consultants: None Code Status: FULL Family Communication: Nieces at bedside  Medical decision making: What exists of the patient's previous chart  was reviewed in depth and the case was discussed with Dr. Tamera Punt. Patient seen 6:05 AM on 02/13/2016.  Disposition Plan:  I recommend admission to medical surgical bed, inpatient statsu.  Clinical condition: stable.  Anticipate CT studies and BNP and procalcitonin.  If consistent with PNA, will continue antibiotics until able to transition to PO and discharge.  Otherwise, disposition based on further workup.      Edwin Dada [facility] Pager [phone]

## 2016-02-13 NOTE — ED Notes (Signed)
Missed IV attempt. 2nd RN at the bedside.

## 2016-02-13 NOTE — ED Notes (Signed)
Patient going to CT

## 2016-02-13 NOTE — ED Notes (Signed)
Attempted report 

## 2016-02-13 NOTE — Progress Notes (Signed)
4:45 PM I agree with HPI/GPe and A/P per Dr. Loleta Books  ?aspiration High res CT =mutlifocal ONa D/c echo, d/c UA and cult Chang eto zosyn SLP to see in am        Patient Active Problem List   Diagnosis Date Noted  . CAP (community acquired pneumonia) 02/13/2016  . Anemia, unspecified 02/13/2016  . Thrombocytopenia ([place]) 02/13/2016  . Hyponatremia 02/13/2016  . Kyphosis 02/01/2016  . Pressure ulcer of coccygeal region 01/31/2016  . Mobility impaired 01/31/2016  . Osteoarthritis 01/31/2016  . Abdominal pain, unspecified site 04/23/2014  . Essential hypertension 09/06/2011  . DM (diabetes mellitus), type 2, uncontrolled ([place]) 09/06/2011  . Ovarian torsion 08/25/2011

## 2016-02-13 NOTE — ED Provider Notes (Signed)
CSN: [id number]     Arrival date & time 02/12/16  2138 History  By signing my name below, I, Altamease Oiler, attest that this documentation has been prepared under the direction and in the presence of Malvin Johns, MD. Electronically Signed: Altamease Oiler, ED Scribe. 02/13/2016. 3:06 AM   Chief Complaint  Patient presents with  . Nasal Congestion  . Otalgia   The history is provided by the patient and a relative. No language interpreter was used.   Erika Day is a 82 y.o. female with PMHx of HTN, DM, dyslipidemia, stroke, and cancer who presents to the Emergency Department complaining of nasal congestion and yellow drainage with onset 1 week ago. Zyrtec and nasal mist have provided insufficient relief at home. Associated symptoms include productive cough, left ear pain with muffled hearing, SOB with coughing spells. Pt denies fever. She is still taking the Augmentin that she was prescribed on 01/29/16 for left OM. She did not know that she was supposed to take the medication twice daily and is only taking it once daily. Prior to the Augmentin she was on Levaquin for same pneumonia in early March.   Past Medical History  Diagnosis Date  . Acid reflux disease   . Panic attacks   . Hypertension   . DDD (degenerative disc disease), lumbar   . Dyslipidemia   . Degenerative joint disease   . Diabetes mellitus without complication ([place])   . Stroke [facility]) age 62 yo    poorly controlled DM and Hypertension  . Cancer [facility])     Looking for documentation of this   Past Surgical History  Procedure Laterality Date  . Laparotomy N/A 04/29/2014    Procedure: EXPLORATORY LAPAROTOMY;  Surgeon: Imogene Burn. Georgette Dover, MD;  Location: [place];  Service: General;  Laterality: N/A;  . Ventral hernia repair N/A 04/29/2014    Procedure: PRIMARY REPAIR OF VENTRAL HERNIA;  Surgeon: Imogene Burn. Georgette Dover, MD;  Location: [place];  Service: General;  Laterality: N/A;  . Lysis of adhesion N/A 04/29/2014    Procedure:  EXTENSIVE LYSIS OF ADHESIONS;  Surgeon: Imogene Burn. Georgette Dover, MD;  Location: [place];  Service: General;  Laterality: N/A;  . Cholecystectomy    . Abdominal hysterectomy  ? 08/25/2011 ?    Has BSO for left benign ovarian tumor with torsion and broad ligament benign tumor, but does not appear had hysterectomy--[facility], NOT ovarian cancer   Family History  Problem Relation Age of Onset  . Hypertension Mother   . Stroke Mother   . Arthritis Mother   . Alcohol abuse Father    Social History  Substance Use Topics  . Smoking status: Former Smoker    Types: Cigarettes    Quit date: 02/18/1990  . Smokeless tobacco: Former Systems developer    Types: Snuff    Quit date: 02/18/1990  . Alcohol Use: No     Comment: Only drank alcohol between ages of 5 and 47 yo.  Her father gave it to her   OB History    No data available     Review of Systems  HENT: Positive for congestion (and yellow drainage ), ear pain and hearing loss.   Respiratory: Positive for cough and shortness of breath.   All other systems reviewed and are negative.  Allergies  Morphine and related  Home Medications   Prior to Admission medications   Medication Sig Start Date End Date Taking? Authorizing Provider  amoxicillin-clavulanate (AUGMENTIN) 875-125 MG tablet Take 1 tablet by mouth  2 (two) times daily.    Historical Provider, MD  aspirin EC 325 MG tablet Take 325 mg by mouth daily.      Historical Provider, MD  cetirizine (ZYRTEC) 10 MG tablet Take 10 mg by mouth daily as needed. For allergies. 12/23/15   Historical Provider, MD  HYDROcodone-acetaminophen (NORCO) 5-325 MG tablet Take 1 tablet by mouth every 6 (six) hours as needed. 01/20/16   Veryl Speak, MD  lisinopril (PRINIVIL,ZESTRIL) 10 MG tablet Take 10 mg by mouth daily. Reported on 01/31/2016    Historical Provider, MD  pantoprazole (PROTONIX) 40 MG tablet Take 40 mg by mouth daily.    Historical Provider, MD  prednisoLONE acetate (PRED FORTE) 1 % ophthalmic suspension 2 drops 4  (four) times daily. 2 drops every 4 hours in right eye and 2 drops twice daily in left eye    Historical Provider, MD  timolol (TIMOPTIC) 0.5 % ophthalmic solution Place 1 drop into both eyes 2 (two) times daily. 12/31/15   Historical Provider, MD  verapamil (CALAN-SR) 120 MG CR tablet Take 120 mg by mouth at bedtime.    Historical Provider, MD   BP 145/67 mmHg  Pulse 67  Temp(Src) 99.2 F (37.3 C) (Oral)  SpO2 97% Physical Exam  Constitutional: She is oriented to person, place, and time. She appears well-developed and well-nourished. No distress.  HENT:  Head: Normocephalic and atraumatic.  Eyes: EOM are normal.  Neck: Normal range of motion.  Cardiovascular: Normal rate and regular rhythm.   Murmur heard. Pulmonary/Chest: Effort normal. She has rhonchi (bilaterally).  Abdominal: Soft. She exhibits no distension. There is no tenderness.  Musculoskeletal: Normal range of motion.  Neurological: She is alert and oriented to person, place, and time.  Skin: Skin is warm and dry.  Psychiatric: She has a normal mood and affect. Judgment normal.  Nursing note and vitals reviewed.   ED Course  Procedures (including critical care time) DIAGNOSTIC STUDIES: Oxygen Saturation is 97% on RA,  normal by my interpretation.    COORDINATION OF CARE: 3:04 AM Discussed treatment plan which includes CXR, abx, and admission to the hospital with pt at bedside and pt agreed to plan.  Labs Review Labs Reviewed  BASIC METABOLIC PANEL  CBC WITH DIFFERENTIAL/PLATELET  I-STAT CG4 LACTIC ACID, ED    Imaging Review Dg Chest 2 View  02/12/2016  CLINICAL DATA:  Productive cough for 1 week EXAM: CHEST  2 VIEW COMPARISON:  January 20, 2016 FINDINGS: There is patchy infiltrate in the right base. Lungs elsewhere clear. Heart is upper normal in size with pulmonary vascularity within normal limits. There is atherosclerotic calcification in the aorta. No bone lesions. There is stable elevation of the right  hemidiaphragm. IMPRESSION: Patchy infiltrate right base.  Lungs elsewhere clear. Followup PA and lateral chest radiographs recommended in 3-4 weeks following trial of antibiotic therapy to ensure resolution and exclude underlying malignancy. Electronically Signed   By: Lowella Grip III M.D.   On: 02/12/2016 22:41   I have personally reviewed and evaluated these images as part of my medical decision-making.   EKG Interpretation None      MDM   Final diagnoses:  None    Patient presents with cough and upper respiratory congestion. She has evidence of pneumonia on chest x-ray. She's recently completed a course of Levaquin as well as being on a current course of Augmentin. Given this with ongoing pneumonia in the right lung, and feel that she has scheduled outpatient therapy needs to be  admitted for IV antibiotics. She has no hypoxia. No hemodynamic instability.  I spoke with Dr. Loleta Books who will admit pt.  She was given Rocephin and zithromax in the ED.  I personally performed the services described in this documentation, which was scribed in my presence.  The recorded information has been reviewed and considered.    Malvin Johns, MD 02/13/16 [id number]

## 2016-02-14 LAB — FOLATE RBC
FOLATE, HEMOLYSATE: 338.8 ng/mL
FOLATE, RBC: 1255 ng/mL (ref >498)
Hematocrit: 27 % — ABNORMAL LOW (ref 34.0–46.6)

## 2016-02-14 LAB — CBC
HEMATOCRIT: 25.5 % — AB (ref 36.0–46.0)
Hemoglobin: 8.3 g/dL — ABNORMAL LOW (ref 12.0–15.0)
MCH: 27.8 pg (ref 26.0–34.0)
MCHC: 32.5 g/dL (ref 30.0–36.0)
MCV: 85.3 fL (ref 78.0–100.0)
PLATELETS: 115 10*3/uL — AB (ref 150–400)
RBC: 2.99 MIL/uL — ABNORMAL LOW (ref 3.87–5.11)
RDW: 14 % (ref 11.5–15.5)
WBC: 6 10*3/uL (ref 4.0–10.5)

## 2016-02-14 LAB — BASIC METABOLIC PANEL
Anion gap: 9 (ref 5–15)
BUN: 17 mg/dL (ref 6–20)
CHLORIDE: 100 mmol/L — AB (ref 101–111)
CO2: 22 mmol/L (ref 22–32)
CREATININE: 0.72 mg/dL (ref 0.44–1.00)
Calcium: 8.7 mg/dL — ABNORMAL LOW (ref 8.9–10.3)
GFR calc Af Amer: >60 mL/min (ref >60)
GFR calc non Af Amer: >60 mL/min (ref >60)
GLUCOSE: 83 mg/dL (ref 65–99)
POTASSIUM: 4.9 mmol/L (ref 3.5–5.1)
SODIUM: 131 mmol/L — AB (ref 135–145)

## 2016-02-14 MED ORDER — AMOXICILLIN-POT CLAVULANATE 875-125 MG PO TABS
1.0000 | ORAL_TABLET | Freq: Two times a day (BID) | ORAL | Status: DC
  Administered 2016-02-14 – 2016-02-15 (×2): 1 via ORAL
  Filled 2016-02-14 (×2): qty 1

## 2016-02-14 MED ORDER — RESOURCE THICKENUP CLEAR PO POWD
ORAL | Status: DC | PRN
  Filled 2016-02-14: qty 125

## 2016-02-14 NOTE — Progress Notes (Signed)
PROGRESS NOTE    Erika Day  [id number] DOB: 1930/03/13 DOA: 02/13/2016 PCP: Philis Fendt, MD  Outpatient Specialists:     Brief Narrative:  89 ? HTN Multiple episodic pneumonia, otitis media-followed up multiple times and noncompliant on pills. Initially admitted for Community acquired pneumonia felt might be completed consistent with aspiration  ED, she had a low grade fever, and was hemodynamically stable without hypoxia. CXR was read as RLL opacity, although this is disputable. Na 132, K 4.4, Cr 0.8, WBC 6K, Hgb 9.1 normocytic, platelets 127K. She was given ceftriaxone azithromycin for CAP failed outpatient therapy  Assessment & Plan:   Principal Problem:   CAP (community acquired pneumonia) Active Problems:   Essential hypertension   DM (diabetes mellitus), type 2, uncontrolled ([place])   Anemia, unspecified   Thrombocytopenia (HCC)   Hyponatremia   speech therapy input appreciated Dysphagia II diet ordered-continue thickener for nectar thick liquids Transitioned from broad spectrum IV to by mouth Augmentin 02/14/2016  hyponatremia was mild and likely secondary to poor solute intake in setting of increased free water and is resolving-osmolality was 275 which is low and consistent with volume excess >solute  Thrombocytopenia seems chronic and is likely related to 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  Anemia should be followed by primary physician  Blood pressure seems controlled on lisinopril 10, verapamil 120      DVT prophylaxis: Lovenox Code Status: Full Family Communication: (Spoke with the daughter- Page,[name] Daughter [phone]  Disposition Plan: Therapy Eval   Consultants:   speech  Procedures:    none  Antimicrobials:   vanc--stopped 4/14  azithro-- stopped 4/14  Augmentin 4/14    Subjective:  Feels better Eating some No other issues  Objective: Filed Vitals:   02/13/16 1253 02/13/16 2154 02/14/16 0426  02/14/16 1339  BP: 105/49 102/49 96/46 108/46  Pulse: 81 71 59 58  Temp: 97.1 F (36.2 C) 98.5 F (36.9 C) 98.8 F (37.1 C) 98 F (36.7 C)  TempSrc: Oral Oral Oral Oral  Resp: 16 17 15 17   Height:      Weight:      SpO2: 97% 99% 95% 97%    Intake/Output Summary (Last 24 hours) at 02/14/16 1837 Last data filed at 02/14/16 1755  Gross per 24 hour  Intake   1470 ml  Output      0 ml  Net   1470 ml   Filed Weights   02/13/16 0713  Weight: 62.687 kg (138 lb 3.2 oz)    Examination:  General exam: Appears calm and comfortable  Respiratory system: Clear to auscultation. No rales rhonchi Cardiovascular system: S1 & S2 heard, RRR.  Gastrointestinal system: Abdomen is nondistended, soft and nontender. Central nervous system: Alert and oriented. No focal neurological deficits. Extremities: Symmetric 5 x 5 power. Skin: No rashes, lesions or ulcers Psychiatry: Judgement and insight appear normal. Mood & affect appropriate.     Data Reviewed: I have personally reviewed following labs and imaging studies  CBC:  Recent Labs Lab 02/13/16 0350 02/13/16 0741 02/14/16 0413  WBC 6.1 6.0 6.0  NEUTROABS 1.5*  --   --   HGB 9.1* 9.3* 8.3*  HCT 29.0* 28.4*  27.0* 25.5*  MCV 85.3 85.5 85.3  PLT 127* 131* [id number]*   Basic Metabolic Panel:  Recent Labs Lab 02/13/16 0350 02/13/16 0741 02/14/16 0413  NA 132*  --  131*  K 4.4  --  4.9  CL 99*  --  100*  CO2 24  --  22  GLUCOSE 77  --  83  BUN 7  --  17  CREATININE 0.77 0.80 0.72  CALCIUM 9.2  --  8.7*   GFR: Estimated Creatinine Clearance: 46.3 mL/min (by C-G formula based on Cr of 0.72). Liver Function Tests: No results for input(s): AST, ALT, ALKPHOS, BILITOT, PROT, ALBUMIN in the last 168 hours. No results for input(s): LIPASE, AMYLASE in the last 168 hours. No results for input(s): AMMONIA in the last 168 hours. Coagulation Profile: No results for input(s): INR, PROTIME in the last 168 hours. Cardiac Enzymes: No  results for input(s): CKTOTAL, CKMB, CKMBINDEX, TROPONINI in the last 168 hours. BNP (last 3 results) No results for input(s): PROBNP in the last 8760 hours. HbA1C: No results for input(s): HGBA1C in the last 72 hours. CBG: No results for input(s): GLUCAP in the last 168 hours. Lipid Profile: No results for input(s): CHOL, HDL, LDLCALC, TRIG, CHOLHDL, LDLDIRECT in the last 72 hours. Thyroid Function Tests: No results for input(s): TSH, T4TOTAL, FREET4, T3FREE, THYROIDAB in the last 72 hours. Anemia Panel:  Recent Labs  02/13/16 0741  VITAMINB12 368  FERRITIN 97  RETICCTPCT 0.5   Urine analysis:    Component Value Date/Time   COLORURINE YELLOW 01/02/2016 1504   APPEARANCEUR CLEAR 01/02/2016 1504   LABSPEC 1.017 01/02/2016 1504   PHURINE 6.5 01/02/2016 1504   GLUCOSEU NEGATIVE 01/02/2016 1504   HGBUR NEGATIVE 01/02/2016 1504   BILIRUBINUR NEGATIVE 01/02/2016 1504   KETONESUR NEGATIVE 01/02/2016 1504   PROTEINUR NEGATIVE 01/02/2016 1504   UROBILINOGEN 0.2 05/17/2015 2307   NITRITE NEGATIVE 01/02/2016 1504   LEUKOCYTESUR NEGATIVE 01/02/2016 1504   Sepsis Labs: @LABRCNTIP (procalcitonin:4,lacticidven:4)  )No results found for this or any previous visit (from the past 240 hour(s)).       Radiology Studies: Dg Chest 2 View  02/12/2016  CLINICAL DATA:  Productive cough for 1 week EXAM: CHEST  2 VIEW COMPARISON:  January 20, 2016 FINDINGS: There is patchy infiltrate in the right base. Lungs elsewhere clear. Heart is upper normal in size with pulmonary vascularity within normal limits. There is atherosclerotic calcification in the aorta. No bone lesions. There is stable elevation of the right hemidiaphragm. IMPRESSION: Patchy infiltrate right base.  Lungs elsewhere clear. Followup PA and lateral chest radiographs recommended in 3-4 weeks following trial of antibiotic therapy to ensure resolution and exclude underlying malignancy. Electronically Signed   By: Lowella Grip III  M.D.   On: 02/12/2016 22:41   Ct Chest Wo Contrast  02/13/2016  CLINICAL DATA:  Pneumonia, not responding to medication but patient believes she has been taking the medication incorrectly. EXAM: CT CHEST WITHOUT CONTRAST TECHNIQUE: Multidetector CT imaging of the chest was performed following the standard protocol without IV contrast. COMPARISON:  02/12/2016 FINDINGS: There is consolidation with air bronchograms in the lingula and also in the posterior right lower lobe. Airways are patent. There are no effusions. Mildly prominent hilar and mediastinal nodes may be reactive. No significant abnormality in the upper abdomen. No significant musculoskeletal lesion. There is moderate degenerative disc disease and kyphosis in the thoracic spine. There is extensive calcified plaque of the coronary arteries. IMPRESSION: Consolidation and air bronchograms in the lingula and in the posterior right lower lobe. This likely represents multifocal pneumonia. No empyema or effusion. Electronically Signed   By: Andreas Newport M.D.   On: 02/13/2016 06:54        Scheduled Meds: . amoxicillin-clavulanate  1 tablet Oral Q12H  . aspirin EC  325 mg  Oral Daily  . enoxaparin (LOVENOX) injection  40 mg Subcutaneous Q24H  . fluticasone  1 spray Each Nare Daily  . lisinopril  10 mg Oral Daily  . loratadine  10 mg Oral Daily  . pantoprazole  40 mg Oral Daily  . verapamil  120 mg Oral QHS   Continuous Infusions:    LOS: 1 day    Time spent: [place], MD [facility] ([facility]   If 7PM-7AM, please contact night-coverage [email or web address] Password [facility] 02/14/2016, 6:37 PM

## 2016-02-14 NOTE — Evaluation (Signed)
Physical Therapy Evaluation Patient Details Name: Erika Day MRN: [id number] DOB: 1930/09/14 Today's Date: 02/14/2016   History of Present Illness  Patient is an 82 yo female admitted 02/13/16 with cough, fatigue.  Patient with CAP and anemia.  PMH:  HTN, lumbar DDD, DM, CVA, CHF  Clinical Impression  Patient presents with problems listed below.  Will benefit from acute PT to maximize functional mobility prior to discharge home with family.  Recommend HHPT for continued therapy at d/c.    Follow Up Recommendations Home health PT;Supervision/Assistance - 24 hour    Equipment Recommendations  None recommended by PT    Recommendations for Other Services       Precautions / Restrictions Precautions Precautions: Fall Restrictions Weight Bearing Restrictions: No      Mobility  Bed Mobility Overal bed mobility: Modified Independent             General bed mobility comments: Required increased time to move to EOB.  Transfers Overall transfer level: Needs assistance Equipment used: None Transfers: Sit to/from [facility] Sit to Stand: Min assist Stand pivot transfers: Min assist       General transfer comment: Assist to power up to standing and to steady.  Patient able to transfer bed <> chair with min assist to steady.  Patient with good balance in sitting.  Ambulation/Gait                Stairs            Wheelchair Mobility    Modified Rankin (Stroke Patients Only)       Balance Overall balance assessment: Needs assistance Sitting-balance support: No upper extremity supported;Feet supported Sitting balance-Leahy Scale: Good     Standing balance support: Single extremity supported Standing balance-Leahy Scale: Poor Standing balance comment: Patient requires UE support to maintain balance.                             Pertinent Vitals/Pain Pain Assessment: No/denies pain    Home Living Family/patient expects to  be discharged to:: Private residence Living Arrangements: Spouse/significant other;Other relatives Available Help at Discharge: Family;Personal care attendant;Available 24 hours/day (Aide 4.5 hours/day) Type of Home: House Home Access: Ramped entrance     Home Layout: One level Home Equipment: Bedside commode;Wheelchair - [employer] - 2 wheels;Shower seat      Prior Function Level of Independence: Independent with assistive device(s);Needs assistance   Gait / Transfers Assistance Needed: Patient reports she transfers bed <> w/c and bed <> BSC on her own.  Uses BSC because w/c will not fit through bathroom door.  ADL's / Homemaking Assistance Needed: Aide assists patient with bathing and helps prepare meals.  Patient also receives mobile meals.        Hand Dominance        Extremity/Trunk Assessment   Upper Extremity Assessment: Overall WFL for tasks assessed           Lower Extremity Assessment: Generalized weakness      Cervical / Trunk Assessment: Kyphotic  Communication   Communication: No difficulties  Cognition Arousal/Alertness: Awake/alert Behavior During Therapy: WFL for tasks assessed/performed Overall Cognitive Status: Within Functional Limits for tasks assessed                      General Comments      Exercises        Assessment/Plan    PT Assessment Patient needs continued  PT services  PT Diagnosis Difficulty walking;Generalized weakness   PT Problem List Decreased strength;Decreased balance;Decreased mobility  PT Treatment Interventions DME instruction;Gait training;Functional mobility training;Therapeutic activities;Therapeutic exercise;Patient/family education   PT Goals (Current goals can be found in the Care Plan section) Acute Rehab PT Goals Patient Stated Goal: To go home PT Goal Formulation: With patient Time For Goal Achievement: 02/21/16 Potential to Achieve Goals: Good    Frequency Min 3X/week   Barriers to  discharge        Co-evaluation               End of Session Equipment Utilized During Treatment: Gait belt Activity Tolerance: Patient tolerated treatment well Patient left: in bed;with call bell/phone within reach Nurse Communication: Mobility status         Time: [id number] PT Time Calculation (min) (ACUTE ONLY): 21 min   Charges:   PT Evaluation $PT Eval Moderate Complexity: 1 Procedure     PT G CodesDespina Pole 24-Feb-2016, 7:51 PM Carita Pian. Sanjuana Kava, [place] Pager [phone]

## 2016-02-14 NOTE — Evaluation (Signed)
Clinical/Bedside Swallow Evaluation Patient Details  Name: Erika Day MRN: [id number] Date of Birth: 02-Jul-1930  Today's Date: 02/14/2016 Time: SLP Start Time (ACUTE ONLY): 0850 SLP Stop Time (ACUTE ONLY): 0914 SLP Time Calculation (min) (ACUTE ONLY): 24 min  Past Medical History:  Past Medical History  Diagnosis Date  . Acid reflux disease   . Panic attacks   . Hypertension   . DDD (degenerative disc disease), lumbar   . Dyslipidemia   . Degenerative joint disease   . Diabetes mellitus without complication ([place])   . Stroke [facility]) age 37 yo    poorly controlled DM and Hypertension  . Cancer [facility])     Looking for documentation of this  . Pneumonia 02/2016   Past Surgical History:  Past Surgical History  Procedure Laterality Date  . Laparotomy N/A 04/29/2014    Procedure: EXPLORATORY LAPAROTOMY;  Surgeon: Imogene Burn. Georgette Dover, MD;  Location: [place];  Service: General;  Laterality: N/A;  . Ventral hernia repair N/A 04/29/2014    Procedure: PRIMARY REPAIR OF VENTRAL HERNIA;  Surgeon: Imogene Burn. Georgette Dover, MD;  Location: [place];  Service: General;  Laterality: N/A;  . Lysis of adhesion N/A 04/29/2014    Procedure: EXTENSIVE LYSIS OF ADHESIONS;  Surgeon: Imogene Burn. Georgette Dover, MD;  Location: [place];  Service: General;  Laterality: N/A;  . Cholecystectomy    . Abdominal hysterectomy  ? 08/25/2011 ?    Has BSO for left benign ovarian tumor with torsion and broad ligament benign tumor, but does not appear had hysterectomy--[facility], NOT ovarian cancer   HPI:  Erika Day is a 82 y.o. female with a past medical history significant for HTN, wheelchair-bound who presents with cough on and off for 2 months.   Assessment / Plan / Recommendation Clinical Impression  Pt participated in clinical assessment of swallow function due to physician concern for possible aspiration. Pt positioned upright and trialed with thin, nectar, honey, puree and dry solid. Pt presented [facility] with all consistencies with the except of  large boluses of thin. Audible swallow suggestive of mild pharyngeal discoordination noted with large boluses of thin. When pt given directive to take small sips, performance returned to [facility]. Recommend: Continuation of Dys 2 diet, but change to thin liquid. Small sips and OOB for all meals. Meds whole with applesauce. Will follow and if any concern remains for aspiration, will pursue objective measure of swallow function at that time. Discussed POC with pt and RN who both verbalized understanding. Of note, pt reports a sensation of "bubbling up, then I cough" and endorses reflux. Consideration might be given to additional treatment of GERD if appropriate.     Aspiration Risk  Mild aspiration risk    Diet Recommendation Dysphagia 2 (Fine chop);Thin liquid   Liquid Administration via: Cup;Straw (Small sips) Medication Administration: Whole meds with puree Supervision: Intermittent supervision to cue for compensatory strategies Compensations: Slow rate;Small sips/bites Postural Changes: Seated upright at 90 degrees (OOB for meals)    Other  Recommendations Oral Care Recommendations: Oral care BID   Follow up Recommendations  None    Frequency and Duration min 2x/week  1 week       Prognosis Prognosis for Safe Diet Advancement: Good      Swallow Study   General Date of Onset: 02/13/16 HPI: ZONYA Day is a 82 y.o. female with a past medical history significant for HTN, wheelchair-bound who presents with cough on and off for 2 months. Type of Study: Bedside Swallow Evaluation  Previous Swallow Assessment: none Diet Prior to this Study: Dysphagia 2 (chopped);Nectar-thick liquids Temperature Spikes Noted: No Respiratory Status: Room air History of Recent Intubation: No Behavior/Cognition: Alert;Cooperative;Pleasant mood Oral Cavity Assessment: Within Functional Limits Oral Care Completed by SLP: No Oral Cavity - Dentition: Missing dentition;Poor condition Vision: Functional for  self-feeding Self-Feeding Abilities: Able to feed self Patient Positioning: Upright in bed Baseline Vocal Quality: Normal Volitional Cough: Congested Volitional Swallow: Able to elicit    Oral/Motor/Sensory Function Overall Oral Motor/Sensory Function: Within functional limits   Ice Chips Ice chips: Within functional limits Presentation: Spoon   Thin Liquid Thin Liquid: Impaired Presentation: Spoon;Cup;Straw;Self Fed Pharyngeal  Phase Impairments: Suspected delayed Swallow (Audible swallow with large bolus, ? mild discoordination)    Nectar Thick Nectar Thick Liquid: Within functional limits   Honey Thick Honey Thick Liquid: Within functional limits   Puree Puree: Within functional limits   Solid   GO   Solid: Impaired Presentation: Self Fed Oral Phase Impairments: Impaired mastication Oral Phase Functional Implications: Impaired mastication        [name] MA, CCC-SLP Pager: [phone] 02/14/2016,9:35 AM

## 2016-02-15 LAB — PROCALCITONIN: Procalcitonin: <0.1 ng/mL

## 2016-02-15 LAB — EXPECTORATED SPUTUM ASSESSMENT W GRAM STAIN, RFLX TO RESP C

## 2016-02-15 MED ORDER — FLUTICASONE PROPIONATE 50 MCG/ACT NA SUSP: 1.0000 | Freq: Every day | NASAL | Status: DC

## 2016-02-15 MED ORDER — AMOXICILLIN-POT CLAVULANATE 875-125 MG PO TABS: 1.0000 | ORAL_TABLET | Freq: Two times a day (BID) | ORAL | Status: DC

## 2016-02-15 NOTE — Discharge Summary (Signed)
Physician Discharge Summary  Erika Day [id number] DOB: [birth year] DOA: 02/13/2016  PCP: Philis Fendt, MD  Admit date: 02/13/2016 Discharge date: 02/15/2016  Time spent: 35 minutes  Recommendations for Outpatient Follow-up:  1. The patient will need close outpatient follow-up with PCP regarding aspiration and precautions as well as education. I spoke to the family regarding this 2. Complete Augmentin/18/2017 for 7 day treatment of aspiration pneumonia 3. The patient would benefit from some assistance at home and home health was ordered  Discharge Diagnoses:  Principal Problem:   CAP (community acquired pneumonia) Active Problems:   Essential hypertension   DM (diabetes mellitus), type 2, uncontrolled ([place])   Anemia, unspecified   Thrombocytopenia ([place])   Hyponatremia   Discharge Condition: improved  Diet recommendation: dysphagia to find chopped with thin liquids and seated upright  Filed Weights   02/13/16 0713  Weight: 62.687 kg (138 lb 3.2 oz)    History of present illness:  89 ? HTN Multiple episodic pneumonia, otitis media-followed up multiple times and noncompliant on pills. Initially admitted for Community acquired pneumonia felt might be completed consistent with aspiration  ED, she had a low grade fever, and was hemodynamically stable without hypoxia. CXR was read as RLL opacity, although this is disputable. Na 132, K 4.4, Cr 0.8, WBC 6K, Hgb 9.1 normocytic, platelets 127K. She was given ceftriaxone azithromycin for CAP failed outpatient therapy  Hospital Course:    It was eventually felt that the patient had aspiration pneumonia speech therapy input appreciated Dysphagia II diet ordered-continue thickener for nectar thick liquids Transitioned from broad spectrum IV to by mouth Augmentin 02/14/2016 Should complete 7 days treatment probably on 02/18/2016  hyponatremia was mild and likely secondary to poor solute intake in setting of increased free  water and is resolving-osmolality was 275 which is low and consistent with volume excess >solute Sodium improved to 1:30 on this admission and she should increase his solute in her diet  Thrombocytopenia /anemia which is probably borderline nutritional versus poor production  seems chronic and is likely related to 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  Anemia should be followed by primary physician  Blood pressure seems controlled on lisinopril 10, verapamil 120    Discharge Exam: Filed Vitals:   02/14/16 2228 02/15/16 0555  BP: 114/52 129/59  Pulse: 63 60  Temp: 99 F (37.2 C) 98.7 F (37.1 C)  Resp: 18 17     alert pleasant oriented no apparent issues EOMI NCAT S1-S2 no murmur rub or gallop chest clinically clear although slightly more dull on the right posterior aspect of the lungs   Discharge Instructions   Discharge Instructions    Diet - low sodium heart healthy    Complete by:  As directed      Discharge instructions    Complete by:  As directed   Complete the abx going forward for 3 days more     Increase activity slowly    Complete by:  As directed           Current Discharge Medication List    START taking these medications   Details  amoxicillin-clavulanate (AUGMENTIN) 875-125 MG tablet Take 1 tablet by mouth every 12 (twelve) hours. Qty: 6 tablet, Refills: 0    fluticasone (FLONASE) 50 MCG/ACT nasal spray Place 1 spray into both nostrils daily. Qty: 16 g, Refills: 2      CONTINUE these medications which have NOT CHANGED   Details  aspirin EC 325 MG tablet Take  325 mg by mouth daily.      cetirizine (ZYRTEC) 10 MG tablet Take 10 mg by mouth daily as needed for allergies. For allergies. Refills: 2    furosemide (LASIX) 20 MG tablet Take 20 mg by mouth daily as needed for fluid.    HYDROcodone-acetaminophen (NORCO) 5-325 MG tablet Take 1 tablet by mouth every 6 (six) hours as needed. Qty: 20 tablet, Refills: 0    lisinopril  (PRINIVIL,ZESTRIL) 10 MG tablet Take 10 mg by mouth daily. Reported on 01/31/2016    ofloxacin (FLOXIN) 0.3 % otic solution Place 3 drops into the left ear daily.    pantoprazole (PROTONIX) 40 MG tablet Take 40 mg by mouth daily.    prednisoLONE acetate (PRED FORTE) 1 % ophthalmic suspension Place 1-2 drops into both eyes See admin instructions. 2 drops in right eye and 1 drop in the left eye twice daily    verapamil (CALAN-SR) 120 MG CR tablet Take 120 mg by mouth at bedtime.       Allergies  Allergen Reactions  . Morphine And Related Other (See Comments)    Blood sugar dropped one-time      The results of significant diagnostics from this hospitalization (including imaging, microbiology, ancillary and laboratory) are listed below for reference.    Significant Diagnostic Studies: Dg Chest 2 View  02/12/2016  CLINICAL DATA:  Productive cough for 1 week EXAM: CHEST  2 VIEW COMPARISON:  January 20, 2016 FINDINGS: There is patchy infiltrate in the right base. Lungs elsewhere clear. Heart is upper normal in size with pulmonary vascularity within normal limits. There is atherosclerotic calcification in the aorta. No bone lesions. There is stable elevation of the right hemidiaphragm. IMPRESSION: Patchy infiltrate right base.  Lungs elsewhere clear. Followup PA and lateral chest radiographs recommended in 3-4 weeks following trial of antibiotic therapy to ensure resolution and exclude underlying malignancy. Electronically Signed   By: Lowella Grip III M.D.   On: 02/12/2016 22:41   Dg Chest 2 View  01/20/2016  CLINICAL DATA:  Intermittent low back pain for the past 2 weeks, worsening yesterday morning. No trauma. EXAM: CHEST  2 VIEW COMPARISON:  01/02/2016 FINDINGS: There is mild unchanged right hemidiaphragm elevation. Mild linear scarring or atelectasis persists in the bases without significant interval change. No confluent alveolar opacity. No effusion. Normal pulmonary vasculature. Unchanged  moderate aortic tortuosity and mild cardiomegaly. IMPRESSION: Mild linear scarring or atelectasis persists in both bases. Unchanged mild cardiomegaly and moderate aortic tortuosity. Electronically Signed   By: Andreas Newport M.D.   On: 01/20/2016 03:07   Dg Thoracic Spine 2 View  01/20/2016  CLINICAL DATA:  Intermittent low back pain for the past 2 weeks, worsening yesterday morning. EXAM: THORACIC SPINE 2 VIEWS COMPARISON:  None. FINDINGS: There is moderate kyphosis. There is moderate degenerative thoracic disc disease. No bone lesion or bony destruction is evident. Moderate curvature, right convex at T4 and left convex at T10. IMPRESSION: Curvature and degenerative change. Negative for acute thoracic spine fracture. Electronically Signed   By: Andreas Newport M.D.   On: 01/20/2016 03:08   Dg Lumbar Spine 2-3 Views  01/20/2016  CLINICAL DATA:  Intermittent low back pain for the past 2 weeks, worsening yesterday morning. No trauma. EXAM: LUMBAR SPINE - 2-3 VIEW COMPARISON:  None. FINDINGS: There is moderate right convex curvature at L3. The lumbar vertebrae are normal in height. Moderately severe lumbar degenerative disc disease is present. No bone lesion or bony destruction. No evidence of acute  fracture. IMPRESSION: Curvature and degenerative changes. Negative for acute lumbar spine fracture. Electronically Signed   By: Andreas Newport M.D.   On: 01/20/2016 03:09   Ct Chest Wo Contrast  02/13/2016  CLINICAL DATA:  Pneumonia, not responding to medication but patient believes she has been taking the medication incorrectly. EXAM: CT CHEST WITHOUT CONTRAST TECHNIQUE: Multidetector CT imaging of the chest was performed following the standard protocol without IV contrast. COMPARISON:  02/12/2016 FINDINGS: There is consolidation with air bronchograms in the lingula and also in the posterior right lower lobe. Airways are patent. There are no effusions. Mildly prominent hilar and mediastinal nodes may be  reactive. No significant abnormality in the upper abdomen. No significant musculoskeletal lesion. There is moderate degenerative disc disease and kyphosis in the thoracic spine. There is extensive calcified plaque of the coronary arteries. IMPRESSION: Consolidation and air bronchograms in the lingula and in the posterior right lower lobe. This likely represents multifocal pneumonia. No empyema or effusion. Electronically Signed   By: Andreas Newport M.D.   On: 02/13/2016 06:54    Microbiology: No results found for this or any previous visit (from the past 240 hour(s)).   Labs: Basic Metabolic Panel:  Recent Labs Lab 02/13/16 0350 02/13/16 0741 02/14/16 0413  NA 132*  --  131*  K 4.4  --  4.9  CL 99*  --  100*  CO2 24  --  22  GLUCOSE 77  --  83  BUN 7  --  17  CREATININE 0.77 0.80 0.72  CALCIUM 9.2  --  8.7*   Liver Function Tests: No results for input(s): AST, ALT, ALKPHOS, BILITOT, PROT, ALBUMIN in the last 168 hours. No results for input(s): LIPASE, AMYLASE in the last 168 hours. No results for input(s): AMMONIA in the last 168 hours. CBC:  Recent Labs Lab 02/13/16 0350 02/13/16 0741 02/14/16 0413  WBC 6.1 6.0 6.0  NEUTROABS 1.5*  --   --   HGB 9.1* 9.3* 8.3*  HCT 29.0* 28.4*  27.0* 25.5*  MCV 85.3 85.5 85.3  PLT 127* 131* 115*   Cardiac Enzymes: No results for input(s): CKTOTAL, CKMB, CKMBINDEX, TROPONINI in the last 168 hours. BNP: BNP (last 3 results)  Recent Labs  02/13/16 0350  BNP 443.6*    ProBNP (last 3 results) No results for input(s): PROBNP in the last 8760 hours.  CBG: No results for input(s): GLUCAP in the last 168 hours.     SignedNita Sells MD   [facility] 02/15/2016, 1:31 PM

## 2016-02-15 NOTE — Progress Notes (Signed)
Speech Language Pathology Treatment: Dysphagia  Patient Details Name: Erika Day MRN: [id number] DOB: 15-Jun-1930 Today's Date: 02/15/2016 Time: [id number] SLP Time Calculation (min) (ACUTE ONLY): 8 min  Assessment / Plan / Recommendation Clinical Impression  Pt upright in chair for breakfast. No indications of decreased airway protection with pt independently consuming small straw sips thin. Audible swallows sometimes suggestive of cervical difference/abnormality or decreased coordination. Oral cavity clearance during session. Pt exhibited comprehension of and adherence to compensatory strategies. Will follow for upgrade of textures.    HPI HPI: Erika Day is a 82 y.o. female with a past medical history significant for HTN, wheelchair-bound who presents with cough on and off for 2 months.      SLP Plan  Continue with current plan of care     Recommendations  Diet recommendations: Dysphagia 2 (fine chop);Thin liquid Liquids provided via: Straw;Cup Medication Administration: Whole meds with puree Supervision: Patient able to self feed;Intermittent supervision to cue for compensatory strategies Compensations: Slow rate;Small sips/bites Postural Changes and/or Swallow Maneuvers: Seated upright 90 degrees             Oral Care Recommendations: Oral care BID Follow up Recommendations: None Plan: Continue with current plan of care                     Houston Siren 02/15/2016, 9:14 AM  Orbie Pyo Colvin Caroli.Ed [employer] [phone]

## 2016-02-15 NOTE — Progress Notes (Signed)
Erika Day Rang to be D/C'd  per MD order. Discussed with the patient and all questions fully answered.  VSS, Skin clean, dry and intact without evidence of skin break down, no evidence of skin tears noted.  IV catheter discontinued intact. Site without signs and symptoms of complications. Dressing and pressure applied.  An After Visit Summary was printed and given to the patient. Patient received prescription.  D/c education completed with patient/family including follow up instructions, medication list, d/c activities limitations if indicated, with other d/c instructions as indicated by MD - patient able to verbalize understanding, all questions fully answered.   Patient instructed to return to ED, call 911, or call MD for any changes in condition.   Patient to be escorted via [place], and D/C home via private auto.

## 2016-02-18 LAB — CULTURE, RESPIRATORY W GRAM STAIN: Culture: NORMAL

## 2016-02-18 LAB — CULTURE, RESPIRATORY

## 2016-03-02 ENCOUNTER — Telehealth: Payer: Self-pay | Admitting: Internal Medicine

## 2016-03-02 NOTE — Telephone Encounter (Signed)
APP. REMINDER CALL, NO ANSWER

## 2016-03-04 ENCOUNTER — Ambulatory Visit: Payer: Medicare Other | Admitting: Internal Medicine

## 2016-05-01 ENCOUNTER — Other Ambulatory Visit: Payer: Self-pay | Admitting: Internal Medicine

## 2016-05-01 DIAGNOSIS — R591 Generalized enlarged lymph nodes: Secondary | ICD-10-CM

## 2016-05-08 ENCOUNTER — Ambulatory Visit
Admission: RE | Admit: 2016-05-08 | Discharge: 2016-05-08 | Disposition: A | Discharge status: Home / Self Care | Payer: Medicare Other | Source: Ambulatory Visit | Attending: Internal Medicine | Admitting: Internal Medicine

## 2016-05-08 ENCOUNTER — Other Ambulatory Visit: Payer: Self-pay | Admitting: Internal Medicine

## 2016-05-08 DIAGNOSIS — R591 Generalized enlarged lymph nodes: Secondary | ICD-10-CM

## 2016-05-08 DIAGNOSIS — R2231 Localized swelling, mass and lump, right upper limb: Secondary | ICD-10-CM

## 2016-05-12 ENCOUNTER — Other Ambulatory Visit: Payer: Self-pay | Admitting: Internal Medicine

## 2016-05-12 ENCOUNTER — Ambulatory Visit
Admission: RE | Admit: 2016-05-12 | Discharge: 2016-05-12 | Disposition: A | Discharge status: Home / Self Care | Payer: Medicare Other | Source: Ambulatory Visit | Attending: Internal Medicine | Admitting: Internal Medicine

## 2016-05-12 DIAGNOSIS — R2231 Localized swelling, mass and lump, right upper limb: Secondary | ICD-10-CM

## 2016-05-14 ENCOUNTER — Telehealth: Payer: Self-pay | Admitting: Hematology and Oncology

## 2016-05-14 NOTE — Telephone Encounter (Signed)
Telephone call to [facility] at [facility]. She states that the patient has agreed to appointment for tomorrow with Dr. Alvy Bimler.

## 2016-05-15 ENCOUNTER — Ambulatory Visit (HOSPITAL_BASED_OUTPATIENT_CLINIC_OR_DEPARTMENT_OTHER): Payer: Medicare Other

## 2016-05-15 ENCOUNTER — Telehealth: Payer: Self-pay | Admitting: Hematology and Oncology

## 2016-05-15 ENCOUNTER — Ambulatory Visit (HOSPITAL_BASED_OUTPATIENT_CLINIC_OR_DEPARTMENT_OTHER): Payer: Medicare Other | Admitting: Hematology and Oncology

## 2016-05-15 ENCOUNTER — Encounter: Payer: Self-pay | Admitting: Hematology and Oncology

## 2016-05-15 ENCOUNTER — Other Ambulatory Visit (HOSPITAL_COMMUNITY)
Admission: RE | Admit: 2016-05-15 | Discharge: 2016-05-15 | Disposition: A | Discharge status: Home / Self Care | Payer: Medicare Other | Source: Ambulatory Visit | Attending: Hematology and Oncology | Admitting: Hematology and Oncology

## 2016-05-15 ENCOUNTER — Other Ambulatory Visit: Payer: Self-pay | Admitting: Hematology and Oncology

## 2016-05-15 VITALS — BP 160/58 | HR 62 | Temp 98.7°F | Resp 18 | Ht 62.0 in | Wt 136.6 lb

## 2016-05-15 DIAGNOSIS — C8584 Other specified types of non-Hodgkin lymphoma, lymph nodes of axilla and upper limb: Secondary | ICD-10-CM

## 2016-05-15 DIAGNOSIS — I10 Essential (primary) hypertension: Secondary | ICD-10-CM | POA: Diagnosis not present

## 2016-05-15 DIAGNOSIS — R5383 Other fatigue: Secondary | ICD-10-CM

## 2016-05-15 DIAGNOSIS — K5909 Other constipation: Secondary | ICD-10-CM | POA: Diagnosis not present

## 2016-05-15 DIAGNOSIS — D61818 Other pancytopenia: Secondary | ICD-10-CM

## 2016-05-15 DIAGNOSIS — C884 Extranodal marginal zone B-cell lymphoma of mucosa-associated lymphoid tissue [MALT-lymphoma]: Secondary | ICD-10-CM

## 2016-05-15 DIAGNOSIS — C9110 Chronic lymphocytic leukemia of B-cell type not having achieved remission: Secondary | ICD-10-CM

## 2016-05-15 DIAGNOSIS — K5900 Constipation, unspecified: Secondary | ICD-10-CM | POA: Insufficient documentation

## 2016-05-15 DIAGNOSIS — C8304 Small cell B-cell lymphoma, lymph nodes of axilla and upper limb: Secondary | ICD-10-CM

## 2016-05-15 HISTORY — DX: Other specified types of non-hodgkin lymphoma, lymph nodes of axilla and upper limb: C85.84

## 2016-05-15 HISTORY — DX: Small cell b-cell lymphoma, lymph nodes of axilla and upper limb: C83.04

## 2016-05-15 HISTORY — DX: Chronic lymphocytic leukemia of B-cell type not having achieved remission: C91.10

## 2016-05-15 LAB — CBC & DIFF AND RETIC
BASO%: 0.3 % (ref 0.0–2.0)
BASOS ABS: 0 10*3/uL (ref 0.0–0.1)
EOS%: 0.6 % (ref 0.0–7.0)
Eosinophils Absolute: 0.1 10*3/uL (ref 0.0–0.5)
HCT: 31.7 % — ABNORMAL LOW (ref 34.8–46.6)
HGB: 10.4 g/dL — ABNORMAL LOW (ref 11.6–15.9)
Immature Retic Fract: 0.5 % — ABNORMAL LOW (ref 1.60–10.00)
LYMPH#: 6.5 10*3/uL — AB (ref 0.9–3.3)
LYMPH%: 77.8 % — ABNORMAL HIGH (ref 14.0–49.7)
MCH: 27.8 pg (ref 25.1–34.0)
MCHC: 32.7 g/dL (ref 31.5–36.0)
MCV: 85.1 fL (ref 79.5–101.0)
MONO#: 0.2 10*3/uL (ref 0.1–0.9)
MONO%: 2.6 % (ref 0.0–14.0)
NEUT%: 18.7 % — ABNORMAL LOW (ref 38.4–76.8)
NEUTROS ABS: 1.6 10*3/uL (ref 1.5–6.5)
Platelets: 127 10*3/uL — ABNORMAL LOW (ref 145–400)
RBC: 3.73 10*6/uL (ref 3.70–5.45)
RDW: 14 % (ref 11.2–14.5)
RETIC %: 0.63 % — AB (ref 0.70–2.10)
RETIC CT ABS: 23.5 10*3/uL — AB (ref 33.70–90.70)
WBC: 8.3 10*3/uL (ref 3.9–10.3)

## 2016-05-15 LAB — TSH: TSH: 2.079 m(IU)/L (ref 0.308–3.960)

## 2016-05-15 LAB — COMPREHENSIVE METABOLIC PANEL
AST: 13 U/L (ref 5–34)
Albumin: 3.4 g/dL — ABNORMAL LOW (ref 3.5–5.0)
Alkaline Phosphatase: 83 U/L (ref 40–150)
Anion Gap: 9 mEq/L (ref 3–11)
BUN: 11.6 mg/dL (ref 7.0–26.0)
CHLORIDE: 106 meq/L (ref 98–109)
CO2: 24 meq/L (ref 22–29)
CREATININE: 1 mg/dL (ref 0.6–1.1)
Calcium: 9.5 mg/dL (ref 8.4–10.4)
EGFR: 63 mL/min/{1.73_m2} — ABNORMAL LOW (ref >90)
Glucose: 127 mg/dl (ref 70–140)
POTASSIUM: 4.4 meq/L (ref 3.5–5.1)
SODIUM: 139 meq/L (ref 136–145)
Total Bilirubin: 0.3 mg/dL (ref 0.20–1.20)
Total Protein: 7.6 g/dL (ref 6.4–8.3)

## 2016-05-15 LAB — LACTATE DEHYDROGENASE: LDH: 191 U/L (ref 125–245)

## 2016-05-15 LAB — TECHNOLOGIST REVIEW

## 2016-05-15 NOTE — Telephone Encounter (Signed)
per po fto sch pt appt-gave pt copy of avs-sent back to lab °

## 2016-05-15 NOTE — Telephone Encounter (Signed)
per pof to sch pt appt-gave pt copy of avs °

## 2016-05-16 LAB — ERYTHROPOIETIN: ERYTHROPOIETIN: 14.5 m[IU]/mL (ref 2.6–18.5)

## 2016-05-16 LAB — SEDIMENTATION RATE: SED RATE: 15 mm/h (ref 0–40)

## 2016-05-17 ENCOUNTER — Encounter: Payer: Self-pay | Admitting: Hematology and Oncology

## 2016-05-17 DIAGNOSIS — D61818 Other pancytopenia: Secondary | ICD-10-CM | POA: Insufficient documentation

## 2016-05-17 NOTE — Assessment & Plan Note (Signed)
This patient had pancytopenia and incidental lymphadedopathy detected on mammogram The pancytopenia could be related to disease She had reported some weight loss recently but I suspected the weight loss is related to lack of appetite and that could be related to constipation Her CT abdomen 1 year ago is quite remarkable. The most recent CT chest showed no significant lymphadenopathy I will order PET scan for staging and additional blood work

## 2016-05-17 NOTE — Assessment & Plan Note (Signed)
she will continue current medical management. I recommend close follow-up with primary care doctor for medication adjustment.  

## 2016-05-17 NOTE — Assessment & Plan Note (Signed)
This could cause reduced appetite We discussed laxatives management I will check TSH to exclude hypothyroidism

## 2016-05-17 NOTE — Assessment & Plan Note (Signed)
Likely due to lymphoma I will order additional work-up She does not need transfusions for now

## 2016-05-17 NOTE — Progress Notes (Signed)
[place] CONSULT NOTE  Patient Care Team: Mack Hook, MD as PCP - General (Internal Medicine) Nolene Ebbs, MD (Internal Medicine)  CHIEF COMPLAINTS/PURPOSE OF CONSULTATION:  Recent diagnosis of lymphoma  HISTORY OF PRESENTING ILLNESS:  Erika Day 82 y.o. female is here because of recent lymphoma diagnosis. She has recent incidental findings of lymphadenopathy on recent mammogram She had multiple CT scans done in the past for other reasons which showed mild "reactive Lymphadenopathy". 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[place])   05/18/2015 Imaging Ct abdomen and pelvis showed reactive lymphadenopathy and stool filled colon   02/13/2016 Imaging Ct chest showed pneumonia   05/08/2016 Imaging screening mammogram revealed right axillary lymphadenopathy   05/12/2016 Procedure She underwent core needle biopsy of axillary LN   05/12/2016 Pathology Results [id number] Biopsy showed low grade non-Hodgkin B cell lymphoma, likely marginal zone lymphoma   She complained of poor appetite and recent weight loss Wt Readings from Last 3 Encounters:  05/15/16 136 lb 9.6 oz (61.961 kg)  02/13/16 138 lb 3.2 oz (62.687 kg)  10/18/14 168 lb (76.204 kg)   She has chronic constipation. Denies nausea. She denies pain She has chronic back pain. She had recent diagnosis of pneumonia. The patient denies any recent signs or symptoms of bleeding such as spontaneous epistaxis, hematuria or hematochezia.   MEDICAL HISTORY:  Past Medical History  Diagnosis Date  . Acid reflux disease   . Panic attacks   . Hypertension   . DDD (degenerative disc disease), lumbar   . Dyslipidemia   . Degenerative joint disease   . Diabetes mellitus without complication ([facility])   . Stroke [facility]) age 74 yo    poorly controlled DM and Hypertension  . Cancer [facility])     Looking for  documentation of this  . Pneumonia 02/2016  . CLL (chronic lymphocytic leukemia) (Erika Day) 05/15/2016  . 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 Marginal zone lymphoma of axilla (Erika Day) 05/15/2016    SURGICAL HISTORY: Past Surgical History  Procedure Laterality Date  . Laparotomy N/A 04/29/2014    Procedure: EXPLORATORY LAPAROTOMY;  Surgeon: Imogene Burn. Georgette Dover, MD;  Location: [facility];  Service: General;  Laterality: N/A;  . Ventral hernia repair N/A 04/29/2014    Procedure: PRIMARY REPAIR OF VENTRAL HERNIA;  Surgeon: Imogene Burn. Georgette Dover, MD;  Location: [facility];  Service: General;  Laterality: N/A;  . Lysis of adhesion N/A 04/29/2014    Procedure: EXTENSIVE LYSIS OF ADHESIONS;  Surgeon: Imogene Burn. Georgette Dover, MD;  Location: [facility];  Service: General;  Laterality: N/A;  . Cholecystectomy    . Abdominal hysterectomy  ? 08/25/2011 ?    Has BSO for left benign ovarian tumor with torsion and broad ligament benign tumor, but does not appear had hysterectomy--[facility], NOT ovarian cancer    SOCIAL HISTORY: Social History   Social History  . Marital Status: Married    Spouse Name: Kayah Hecker  . Number of Children: 0  . Years of Education: N/A   Occupational History  . Retired housewife    Social History Main Topics  . Smoking status: Former Smoker    Types: Cigarettes    Quit date: 02/18/1990  . Smokeless tobacco: Former Systems developer    Types: Snuff    Quit date: 02/18/1990  . Alcohol Use: No     Comment: Only drank alcohol between ages  of 6 and 65 yo.  Her father gave it to her  . Drug Use: No  . Sexual Activity: Not on file   Other Topics Concern  . Not on file   Social History Narrative   Lives at home with her husband, Erika Day, her "adopted daugher, Erika Day", and Erika Day's little 80 yo boy.   Has been in a wheelchair because she kept falling--in wheelchair for about 10 years.   Sometimes gets up and holds onto furniture or wall to get around.     Did have a walker, but fell with  that.  At one point had PT   Does not have any biologic children, but has cared for other family members' children through childhood   Erika Day is the granddaughter of her 52rd cousin.  She raised Erika Day's mother, Erika Day as well.     2 yo son of Erika Day also lives in their home.     He is in daycare and his great grandmother watches him when she is at work.   Has brought up about 7 children over past 50 years.       FAMILY HISTORY: Family History  Problem Relation Age of Onset  . Hypertension Mother   . Stroke Mother   . Arthritis Mother   . Alcohol abuse Father     ALLERGIES:  is allergic to morphine and related.  MEDICATIONS:  Current Outpatient Prescriptions  Medication Sig Dispense Refill  . aspirin EC 325 MG tablet Take 325 mg by mouth daily.      . cetirizine (ZYRTEC) 10 MG tablet Take 10 mg by mouth daily as needed for allergies. For allergies.  2  . furosemide (LASIX) 20 MG tablet Take 20 mg by mouth daily as needed for fluid.    Marland Kitchen lisinopril (PRINIVIL,ZESTRIL) 10 MG tablet Take 10 mg by mouth daily. Reported on 01/31/2016    . pantoprazole (PROTONIX) 40 MG tablet Take 40 mg by mouth daily.    . prednisoLONE acetate (PRED FORTE) 1 % ophthalmic suspension Place 1-2 drops into both eyes See admin instructions. 2 drops in right eye and 1 drop in the left eye twice daily     No current facility-administered medications for this visit.    REVIEW OF SYSTEMS:   Constitutional: Denies fevers, chills or abnormal night sweats Eyes: Denies blurriness of vision, double vision or watery eyes Ears, nose, mouth, throat, and face: Denies mucositis or sore throat Respiratory: Denies cough, dyspnea or wheezes Cardiovascular: Denies palpitation, chest discomfort or lower extremity swelling Gastrointestinal:  Denies nausea, heartburn or change in bowel habits Skin: Denies abnormal skin rashes Neurological:Denies numbness, tingling or new weaknesses Behavioral/Psych: Mood is stable, no  new changes  All other systems were reviewed with the patient and are negative.  PHYSICAL EXAMINATION: ECOG PERFORMANCE STATUS: 2  Filed Vitals:   05/15/16 1117  BP: 160/58  Pulse: 62  Temp: 98.7 F (37.1 C)  Resp: 18   Filed Weights   05/15/16 1117  Weight: 136 lb 9.6 oz (61.961 kg)    GENERAL:alert, no distress and comfortable. She is thin SKIN: skin color, texture, turgor are normal, no rashes or significant lesions EYES: normal, conjunctiva are pink and non-injected, sclera clear OROPHARYNX:no exudate, no erythema and lips, buccal mucosa, and tongue normal  NECK: supple, thyroid normal size, non-tender, without nodularity LYMPH:  no palpable lymphadenopathy in the cervical, axillary or inguinal LUNGS: clear to auscultation and percussion with normal breathing effort HEART: regular rate & rhythm and no murmurs  and no lower extremity edema ABDOMEN:abdomen soft, non-tender and normal bowel sounds Musculoskeletal:no cyanosis of digits and no clubbing  PSYCH: alert & oriented x 3 with fluent speech NEURO: no focal motor/sensory deficits  LABORATORY DATA:  I have reviewed the data as listed Lab Results  Component Value Date   WBC 8.3 05/15/2016   HGB 10.4* 05/15/2016   HCT 31.7* 05/15/2016   MCV 85.1 05/15/2016   PLT 127* 05/15/2016    Recent Labs  01/02/16 1128 01/20/16 0313 02/13/16 0350 02/13/16 0741 02/14/16 0413 05/15/16 1233  NA 139 133* 132*  --  131* 139  K 4.3 4.4 4.4  --  4.9 4.4  CL 106 101 99*  --  100*  --   CO2 21* 25 24  --  22 24  GLUCOSE 83 94 77  --  83 127  BUN 15 15 7   --  17 11.6  CREATININE 1.11* 0.90 0.77 0.80 0.72 1.0  CALCIUM 9.9 9.7 9.2  --  8.7* 9.5  GFRNONAA 45* 58* >60 >60 >60  --   GFRAA 52* >60 >60 >60 >60  --   PROT 7.3  --   --   --   --  7.6  ALBUMIN 3.2*  --   --   --   --  3.4*  AST 22  --   --   --   --  13  ALT 11*  --   --   --   --  <9  ALKPHOS 54  --   --   --   --  83  BILITOT 1.1  --   --   --   --  0.30     RADIOGRAPHIC STUDIES: I have personally reviewed the radiological images as listed and agreed with the findings in the report. [place] Extrem Up Right Ltd  05/08/2016  CLINICAL DATA:  Patient has lumps in her axilla, more notable on the right. No reported breast lump. EXAM: 2D DIGITAL DIAGNOSTIC BILATERAL MAMMOGRAM WITH CAD AND ADJUNCT TOMO ULTRASOUND RIGHT AXILLA COMPARISON:  None ACR Breast Density Category b: There are scattered areas of fibroglandular density. FINDINGS: There are no discrete breast masses, areas of architectural distortion, areas of significant asymmetry or suspicious calcifications. Mammographic images were processed with CAD. On physical exam, patient has superficial palpable lumps in her right axilla. Targeted ultrasound is performed, showing multiple enlarged lymph nodes with thickened cortices. Largest short axis node measures 1.4 cm. Cortices range between 4 and 6 mm. IMPRESSION: 1. No evidence of breast malignancy. 2. Enlarged right axillary lymph nodes with thickened cortices. This suggests a systemic infectious/inflammatory or neoplastic process. Recommend biopsy of 1 of these lymph nodes. RECOMMENDATION: Patient will be scheduled to undergo ultrasound-guided core needle biopsy of 1 of the right axillary lymph nodes. I have discussed the findings and recommendations with the patient. Results were also provided in writing at the conclusion of the visit. If applicable, a reminder letter will be sent to the patient regarding the next appointment. BI-RADS CATEGORY  1: Negative. Electronically Signed   By: Lajean Manes M.D.   On: 05/08/2016 11:11   Mm Diag Breast Tomo Bilateral  05/08/2016  CLINICAL DATA:  Patient has lumps in her axilla, more notable on the right. No reported breast lump. EXAM: 2D DIGITAL DIAGNOSTIC BILATERAL MAMMOGRAM WITH CAD AND ADJUNCT TOMO ULTRASOUND RIGHT AXILLA COMPARISON:  None ACR Breast Density Category b: There are scattered areas of fibroglandular density.  FINDINGS: There are no discrete  breast masses, areas of architectural distortion, areas of significant asymmetry or suspicious calcifications. Mammographic images were processed with CAD. On physical exam, patient has superficial palpable lumps in her right axilla. Targeted ultrasound is performed, showing multiple enlarged lymph nodes with thickened cortices. Largest short axis node measures 1.4 cm. Cortices range between 4 and 6 mm. IMPRESSION: 1. No evidence of breast malignancy. 2. Enlarged right axillary lymph nodes with thickened cortices. This suggests a systemic infectious/inflammatory or neoplastic process. Recommend biopsy of 1 of these lymph nodes. RECOMMENDATION: Patient will be scheduled to undergo ultrasound-guided core needle biopsy of 1 of the right axillary lymph nodes. I have discussed the findings and recommendations with the patient. Results were also provided in writing at the conclusion of the visit. If applicable, a reminder letter will be sent to the patient regarding the next appointment. BI-RADS CATEGORY  1: Negative. Electronically Signed   By: Lajean Manes M.D.   On: 05/08/2016 11:11   [place] Rt Breast Bx W Loc Dev 1st Lesion Img Bx Spec US Guide  05/14/2016  ADDENDUM REPORT: 05/14/2016 14:16 ADDENDUM: PATHOLOGY ADDENDUM: Pathology right axillary lymph node:  non-Hodgkin B-cell lymphoma Pathology concordance with imaging findings: Yes Recommendation: Consultation regarding treatment plan At the request of the patient, I spoke with her and her daughter by telephone on 05/14/2016 at 14:15. She reports doing well after the biopsy . The patient is scheduled see Dr. Elson [facility] at [facility] at 11:15 on 05/15/2016. Electronically Signed   By: Nolon Nations M.D.   On: 05/14/2016 14:16  05/14/2016  CLINICAL DATA:  Patient returns for ultrasound-guided core biopsy of enlarged right axillary lymph node. EXAM: ULTRASOUND GUIDED CORE NEEDLE BIOPSY OF A RIGHT AXILLARY NODE  COMPARISON:  Previous exam(s). FINDINGS: Prior to biopsy, ultrasound is performed and confirms presence of enlarged right axillary lymph nodes. Additionally, evaluation of the left axilla is performed and demonstrates similar appearance of enlarged axillary lymph nodes but slightly smaller compared to those on the right. Bilateral axillary lymph nodes are palpable. I met with the patient and we discussed the procedure of ultrasound-guided biopsy, including benefits and alternatives. We discussed the high likelihood of a successful procedure. We discussed the risks of the procedure, including infection, bleeding, tissue injury, clip migration, and inadequate sampling. Informed written consent was given. The usual time-out protocol was performed immediately prior to the procedure. Using sterile technique and 1% Lidocaine as local anesthetic, under direct ultrasound visualization, a 14 gauge spring-loaded device was used to perform biopsy of the lower most enlarged right axillary lymph node using a lateral approach. Specimens are submitted in formalin and in saline. At the conclusion of the procedure a coil shaped tissue marker clip was deployed into the biopsy cavity. IMPRESSION: Ultrasound guided biopsy of right axillary lymph node. No apparent complications. Electronically Signed: By: Nolon Nations M.D. 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 The most recent CT chest showed no significant lymphadenopathy I will order PET scan for staging and additional blood work  Pancytopenia, acquired ([place]) Likely due to lymphoma I will order additional  work-up She does not need transfusions for now  Essential hypertension she will continue current medical management. I recommend close follow-up with primary care doctor for medication adjustment.   Constipation This could cause reduced appetite We discussed laxatives management I will check TSH to exclude hypothyroidism   Orders Placed This Encounter  Procedures  . NM PET Image Initial (PI) Skull Base To Thigh    Standing Status: Future     Number of Occurrences:      Standing Expiration Date: 07/17/2017    Order Specific Question:  Reason for Exam (SYMPTOM  OR DIAGNOSIS REQUIRED)    Answer:  staging lymphoma    Order Specific Question:  Preferred imaging location?    Answer:  [facility]  . TSH    Standing Status: Future     Number of Occurrences: 1     Standing Expiration Date: 06/19/2017      All questions were answered. The patient knows to call the clinic with any problems, questions or concerns. I spent 40 minutes counseling the patient face to face. The total time spent in the appointment was 50 minutes and more than 50% was on counseling.     [facility], [place], MD 05/17/2016 4:02 PM

## 2016-05-19 ENCOUNTER — Emergency Department (HOSPITAL_COMMUNITY): Payer: Medicare Other

## 2016-05-19 ENCOUNTER — Inpatient Hospital Stay (HOSPITAL_COMMUNITY): Payer: Medicare Other

## 2016-05-19 ENCOUNTER — Encounter (HOSPITAL_COMMUNITY): Payer: Self-pay | Admitting: Emergency Medicine

## 2016-05-19 ENCOUNTER — Encounter (HOSPITAL_COMMUNITY)
Admission: EM | Disposition: A | Discharge status: Home Health | Payer: Self-pay | Source: Home / Self Care | Attending: Internal Medicine

## 2016-05-19 ENCOUNTER — Inpatient Hospital Stay (HOSPITAL_COMMUNITY)
Admission: EM | Admit: 2016-05-19 | Discharge: 2016-05-22 | DRG: 344 | Disposition: A | Discharge status: Home Health | Payer: Medicare Other | Attending: Internal Medicine | Admitting: Internal Medicine

## 2016-05-19 DIAGNOSIS — E860 Dehydration: Secondary | ICD-10-CM | POA: Diagnosis present

## 2016-05-19 DIAGNOSIS — Z993 Dependence on wheelchair: Secondary | ICD-10-CM

## 2016-05-19 DIAGNOSIS — R600 Localized edema: Secondary | ICD-10-CM | POA: Diagnosis not present

## 2016-05-19 DIAGNOSIS — Z87891 Personal history of nicotine dependence: Secondary | ICD-10-CM | POA: Diagnosis not present

## 2016-05-19 DIAGNOSIS — Z8673 Personal history of transient ischemic attack (TIA), and cerebral infarction without residual deficits: Secondary | ICD-10-CM

## 2016-05-19 DIAGNOSIS — Z8249 Family history of ischemic heart disease and other diseases of the circulatory system: Secondary | ICD-10-CM

## 2016-05-19 DIAGNOSIS — M549 Dorsalgia, unspecified: Secondary | ICD-10-CM | POA: Diagnosis present

## 2016-05-19 DIAGNOSIS — E46 Unspecified protein-calorie malnutrition: Secondary | ICD-10-CM | POA: Diagnosis present

## 2016-05-19 DIAGNOSIS — F410 Panic disorder [episodic paroxysmal anxiety] without agoraphobia: Secondary | ICD-10-CM | POA: Diagnosis present

## 2016-05-19 DIAGNOSIS — Z7982 Long term (current) use of aspirin: Secondary | ICD-10-CM

## 2016-05-19 DIAGNOSIS — R011 Cardiac murmur, unspecified: Secondary | ICD-10-CM

## 2016-05-19 DIAGNOSIS — R109 Unspecified abdominal pain: Secondary | ICD-10-CM | POA: Diagnosis present

## 2016-05-19 DIAGNOSIS — G8929 Other chronic pain: Secondary | ICD-10-CM | POA: Diagnosis present

## 2016-05-19 DIAGNOSIS — Z9049 Acquired absence of other specified parts of digestive tract: Secondary | ICD-10-CM | POA: Diagnosis not present

## 2016-05-19 DIAGNOSIS — Z823 Family history of stroke: Secondary | ICD-10-CM

## 2016-05-19 DIAGNOSIS — M5136 Other intervertebral disc degeneration, lumbar region: Secondary | ICD-10-CM | POA: Diagnosis present

## 2016-05-19 DIAGNOSIS — K219 Gastro-esophageal reflux disease without esophagitis: Secondary | ICD-10-CM | POA: Diagnosis present

## 2016-05-19 DIAGNOSIS — I10 Essential (primary) hypertension: Secondary | ICD-10-CM | POA: Diagnosis present

## 2016-05-19 DIAGNOSIS — Z8261 Family history of arthritis: Secondary | ICD-10-CM

## 2016-05-19 DIAGNOSIS — K562 Volvulus: Secondary | ICD-10-CM | POA: Diagnosis present

## 2016-05-19 DIAGNOSIS — R609 Edema, unspecified: Secondary | ICD-10-CM | POA: Diagnosis not present

## 2016-05-19 DIAGNOSIS — E1165 Type 2 diabetes mellitus with hyperglycemia: Secondary | ICD-10-CM | POA: Diagnosis present

## 2016-05-19 DIAGNOSIS — C8304 Small cell B-cell lymphoma, lymph nodes of axilla and upper limb: Secondary | ICD-10-CM | POA: Diagnosis present

## 2016-05-19 DIAGNOSIS — Z90710 Acquired absence of both cervix and uterus: Secondary | ICD-10-CM

## 2016-05-19 DIAGNOSIS — E43 Unspecified severe protein-calorie malnutrition: Secondary | ICD-10-CM

## 2016-05-19 DIAGNOSIS — R627 Adult failure to thrive: Secondary | ICD-10-CM | POA: Diagnosis present

## 2016-05-19 DIAGNOSIS — IMO0002 Reserved for concepts with insufficient information to code with codable children: Secondary | ICD-10-CM | POA: Diagnosis present

## 2016-05-19 DIAGNOSIS — L8990 Pressure ulcer of unspecified site, unspecified stage: Secondary | ICD-10-CM | POA: Diagnosis present

## 2016-05-19 DIAGNOSIS — E785 Hyperlipidemia, unspecified: Secondary | ICD-10-CM | POA: Diagnosis present

## 2016-05-19 DIAGNOSIS — Z79899 Other long term (current) drug therapy: Secondary | ICD-10-CM

## 2016-05-19 DIAGNOSIS — J9811 Atelectasis: Secondary | ICD-10-CM | POA: Diagnosis present

## 2016-05-19 DIAGNOSIS — E871 Hypo-osmolality and hyponatremia: Secondary | ICD-10-CM | POA: Diagnosis present

## 2016-05-19 DIAGNOSIS — Z885 Allergy status to narcotic agent status: Secondary | ICD-10-CM

## 2016-05-19 DIAGNOSIS — Z7952 Long term (current) use of systemic steroids: Secondary | ICD-10-CM

## 2016-05-19 HISTORY — PX: FLEXIBLE SIGMOIDOSCOPY: SHX5431

## 2016-05-19 LAB — CBC WITH DIFFERENTIAL/PLATELET
BASOS ABS: 0 10*3/uL (ref 0.0–0.1)
Basophils Relative: 0 %
Eosinophils Absolute: 0 10*3/uL (ref 0.0–0.7)
Eosinophils Relative: 0 %
HCT: 34.5 % — ABNORMAL LOW (ref 36.0–46.0)
Hemoglobin: 11.4 g/dL — ABNORMAL LOW (ref 12.0–15.0)
LYMPHS ABS: 6.3 10*3/uL — AB (ref 0.7–4.0)
Lymphocytes Relative: 69 %
MCH: 28.1 pg (ref 26.0–34.0)
MCHC: 33 g/dL (ref 30.0–36.0)
MCV: 85 fL (ref 78.0–100.0)
MONO ABS: 0.2 10*3/uL (ref 0.1–1.0)
Monocytes Relative: 2 %
Neutro Abs: 2.6 10*3/uL (ref 1.7–7.7)
Neutrophils Relative %: 29 %
PLATELETS: 132 10*3/uL — AB (ref 150–400)
RBC: 4.06 MIL/uL (ref 3.87–5.11)
RDW: 13.5 % (ref 11.5–15.5)
WBC: 9.1 10*3/uL (ref 4.0–10.5)

## 2016-05-19 LAB — COMPREHENSIVE METABOLIC PANEL
ALBUMIN: 4.1 g/dL (ref 3.5–5.0)
ALT: 12 U/L — ABNORMAL LOW (ref 14–54)
ANION GAP: 7 (ref 5–15)
AST: 21 U/L (ref 15–41)
Alkaline Phosphatase: 87 U/L (ref 38–126)
BUN: 13 mg/dL (ref 6–20)
CHLORIDE: 102 mmol/L (ref 101–111)
CO2: 24 mmol/L (ref 22–32)
Calcium: 9.5 mg/dL (ref 8.9–10.3)
Creatinine, Ser: 0.87 mg/dL (ref 0.44–1.00)
GFR calc Af Amer: >60 mL/min (ref >60)
GFR, EST NON AFRICAN AMERICAN: 59 mL/min — AB (ref >60)
Glucose, Bld: 121 mg/dL — ABNORMAL HIGH (ref 65–99)
POTASSIUM: 3.9 mmol/L (ref 3.5–5.1)
Sodium: 133 mmol/L — ABNORMAL LOW (ref 135–145)
Total Bilirubin: 0.2 mg/dL — ABNORMAL LOW (ref 0.3–1.2)
Total Protein: 8.1 g/dL (ref 6.5–8.1)

## 2016-05-19 LAB — LIPASE, BLOOD: LIPASE: 29 U/L (ref 11–51)

## 2016-05-19 LAB — ECHOCARDIOGRAM COMPLETE

## 2016-05-19 SURGERY — SIGMOIDOSCOPY, FLEXIBLE
Anesthesia: Moderate Sedation

## 2016-05-19 MED ORDER — FENTANYL CITRATE (PF) 100 MCG/2ML IJ SOLN
INTRAMUSCULAR | Status: DC | PRN
  Administered 2016-05-19: 25 ug via INTRAVENOUS

## 2016-05-19 MED ORDER — HYDROMORPHONE HCL 1 MG/ML IJ SOLN
0.5000 mg | Freq: Once | INTRAMUSCULAR | Status: AC
  Administered 2016-05-19: 0.5 mg via INTRAVENOUS
  Filled 2016-05-19: qty 1

## 2016-05-19 MED ORDER — SPOT INK MARKER SYRINGE KIT
PACK | SUBMUCOSAL | Status: AC
  Filled 2016-05-19: qty 5

## 2016-05-19 MED ORDER — MIDAZOLAM HCL 5 MG/ML IJ SOLN
INTRAMUSCULAR | Status: AC
  Filled 2016-05-19: qty 2

## 2016-05-19 MED ORDER — SODIUM CHLORIDE 0.9 % IV SOLN
INTRAVENOUS | Status: DC
  Administered 2016-05-19 – 2016-05-22 (×5): via INTRAVENOUS

## 2016-05-19 MED ORDER — MIDAZOLAM HCL 10 MG/2ML IJ SOLN
INTRAMUSCULAR | Status: DC | PRN
  Administered 2016-05-19: 2 mg via INTRAVENOUS

## 2016-05-19 MED ORDER — HYDROCODONE-ACETAMINOPHEN 5-325 MG PO TABS
1.0000 | ORAL_TABLET | Freq: Once | ORAL | Status: AC
  Administered 2016-05-19: 1 via ORAL
  Filled 2016-05-19: qty 1

## 2016-05-19 MED ORDER — HYDRALAZINE HCL 20 MG/ML IJ SOLN: 10.0000 mg | Freq: Four times a day (QID) | INTRAMUSCULAR | Status: DC | PRN

## 2016-05-19 MED ORDER — SODIUM CHLORIDE 0.9 % IV SOLN
INTRAVENOUS | Status: DC
  Administered 2016-05-19: 100 mL/h via INTRAVENOUS

## 2016-05-19 MED ORDER — SODIUM CHLORIDE 0.9% FLUSH: 3.0000 mL | Freq: Two times a day (BID) | INTRAVENOUS | Status: DC

## 2016-05-19 MED ORDER — DIPHENHYDRAMINE HCL 50 MG/ML IJ SOLN
INTRAMUSCULAR | Status: AC
  Filled 2016-05-19: qty 1

## 2016-05-19 MED ORDER — LIDOCAINE HCL 2 % EX GEL
1.0000 "application " | Freq: Once | CUTANEOUS | Status: DC
  Filled 2016-05-19: qty 11

## 2016-05-19 MED ORDER — FAMOTIDINE IN NACL 20-0.9 MG/50ML-% IV SOLN
20.0000 mg | Freq: Two times a day (BID) | INTRAVENOUS | Status: DC
  Administered 2016-05-19 – 2016-05-22 (×7): 20 mg via INTRAVENOUS
  Filled 2016-05-19 (×7): qty 50

## 2016-05-19 MED ORDER — ONDANSETRON HCL 4 MG/2ML IJ SOLN
4.0000 mg | Freq: Once | INTRAMUSCULAR | Status: AC
  Administered 2016-05-19: 4 mg via INTRAVENOUS
  Filled 2016-05-19: qty 2

## 2016-05-19 MED ORDER — HYDROMORPHONE HCL 1 MG/ML IJ SOLN: 0.5000 mg | INTRAMUSCULAR | Status: DC | PRN

## 2016-05-19 MED ORDER — SODIUM CHLORIDE 0.9 % IV SOLN: INTRAVENOUS | Status: DC

## 2016-05-19 MED ORDER — ONDANSETRON HCL 4 MG/2ML IJ SOLN: 4.0000 mg | Freq: Four times a day (QID) | INTRAMUSCULAR | Status: DC | PRN

## 2016-05-19 MED ORDER — FENTANYL CITRATE (PF) 100 MCG/2ML IJ SOLN
INTRAMUSCULAR | Status: AC
  Filled 2016-05-19: qty 2

## 2016-05-19 MED ORDER — SODIUM CHLORIDE 0.9 % IV BOLUS (SEPSIS)
500.0000 mL | Freq: Once | INTRAVENOUS | Status: AC
  Administered 2016-05-19: 500 mL via INTRAVENOUS

## 2016-05-19 NOTE — Interval H&P Note (Signed)
History and Physical Interval Note:  05/19/2016 2:59 PM  Erika Day  has presented today for surgery, with the diagnosis of Sigmoid volvulous  The various methods of treatment have been discussed with the patient and family. After consideration of risks, benefits and other options for treatment, the patient has consented to  Procedure(s): FLEXIBLE SIGMOIDOSCOPY (N/A) as a surgical intervention .  The patient's history has been reviewed, patient examined, no change in status, stable for surgery.  I have reviewed the patient's chart and labs.  Questions were answered to the patient's satisfaction.     [name]

## 2016-05-19 NOTE — Consult Note (Signed)
Reason for Consult:  Sigmoid volvulus Referring Physician:  Monico Blitz PA-C PCP: Philis Fendt, MD   Erika Day is an 82 y.o. female.  HPI: Pt presents to the Ed with complaints fo lower abdominal pain and diarrhea that started last PM.  She ;has had issues with chronic constipation for many years. She took some laxatives yesterday, but family relates she has done that for well over 20 years.   She has recently undergone screening Mammogram and found to have some axillary lymphadenopathy.  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  She has been wheelchair bound since 2012. Work up in th ED shows she is afebrile, BP is up some.  Labs are OK, Acute abdominal film with chest shows . Marked gaseous distention of colon in the left abdomen.  Configuration of the dilated colonic loop suggests sigmoid volvulus. CXR show low volume basilar atelectasis, no free air.  We are ask to see.  Past Medical History  Diagnosis Date  . Acid reflux disease   . Panic attacks   . Hypertension   . DDD (degenerative disc disease), lumbar   . Dyslipidemia   . Degenerative joint disease   . Diabetes mellitus without complication ([place])   . Stroke [facility]) age 49 yo    poorly controlled DM and Hypertension  . Cancer [facility])     Looking for documentation of this  . Pneumonia 02/2016  . CLL (chronic lymphocytic leukemia) ([place]) 05/15/2016  . 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 Laparotomy N/A 04/29/2014    Procedure: EXPLORATORY LAPAROTOMY;  Surgeon: Imogene Burn. Georgette Dover, MD;  Location: [place];  Service: General;  Laterality: N/A;  . Ventral hernia repair N/A 04/29/2014    Procedure: PRIMARY REPAIR OF VENTRAL HERNIA;  Surgeon: Imogene Burn. Georgette Dover, MD;  Location: [place];  Service: General;  Laterality: N/A;  . Lysis of  adhesion N/A 04/29/2014    Procedure: EXTENSIVE LYSIS OF ADHESIONS;  Surgeon: Imogene Burn. Georgette Dover, MD;  Location: [place];  Service: General;  Laterality: N/A;  . Cholecystectomy    . Abdominal hysterectomy  ? 08/25/2011 ?    Has BSO for left benign ovarian tumor with torsion and broad ligament benign tumor, but does not appear had hysterectomy--[facility], NOT ovarian cancer    Family History  Problem Relation Age of Onset  . Hypertension Mother   . Stroke Mother   . Arthritis Mother   . Alcohol abuse Father     Social History:  reports that she quit smoking about 26 years ago. Her smoking use included Cigarettes. She quit smokeless tobacco use about 26 years ago. Her smokeless tobacco use included Snuff. She reports that she does not drink alcohol or use illicit drugs.  Allergies:  Allergies  Allergen Reactions  . Morphine And Related Other (See Comments)    Blood sugar dropped one-time    Prior to Admission medications   Medication Sig Start Date End Date Taking? Authorizing Provider  aspirin EC 325 MG tablet Take 325 mg by mouth daily.      Historical Provider, MD  cetirizine (ZYRTEC) 10 MG tablet Take 10 mg by mouth daily as needed for allergies. For allergies. 12/23/15   Historical Provider, MD  furosemide (LASIX) 20 MG tablet Take 20 mg by mouth daily as needed for fluid.  Historical Provider, MD  lisinopril (PRINIVIL,ZESTRIL) 10 MG tablet Take 10 mg by mouth daily. Reported on 01/31/2016    Historical Provider, MD  pantoprazole (PROTONIX) 40 MG tablet Take 40 mg by mouth daily.    Historical Provider, MD  prednisoLONE acetate (PRED FORTE) 1 % ophthalmic suspension Place 1-2 drops into both eyes See admin instructions. 2 drops in right eye and 1 drop in the left eye twice daily    Historical Provider, MD      Results for orders placed or performed during the hospital encounter of 05/19/16 (from the past 48 hour(s))  CBC with Differential     Status: Abnormal (Preliminary result)    Collection Time: 05/19/16 11:58 AM  Result Value Ref Range   WBC 9.1 4.0 - 10.5 K/uL   RBC 4.06 3.87 - 5.11 MIL/uL   Hemoglobin 11.4 (L) 12.0 - 15.0 g/dL   HCT 34.5 (L) 36.0 - 46.0 %   MCV 85.0 78.0 - 100.0 fL   MCH 28.1 26.0 - 34.0 pg   MCHC 33.0 30.0 - 36.0 g/dL   RDW 13.5 11.5 - 15.5 %   Platelets 132 (L) 150 - 400 K/uL   Neutrophils Relative % PENDING %   Neutro Abs PENDING 1.7 - 7.7 K/uL   Band Neutrophils PENDING %   Lymphocytes Relative PENDING %   Lymphs Abs PENDING 0.7 - 4.0 K/uL   Monocytes Relative PENDING %   Monocytes Absolute PENDING 0.1 - 1.0 K/uL   Eosinophils Relative PENDING %   Eosinophils Absolute PENDING 0.0 - 0.7 K/uL   Basophils Relative PENDING %   Basophils Absolute PENDING 0.0 - 0.1 K/uL   WBC Morphology PENDING    RBC Morphology PENDING    Smear Review PENDING    nRBC PENDING 0 /100 WBC   Metamyelocytes Relative PENDING %   Myelocytes PENDING %   Promyelocytes Absolute PENDING %   Blasts PENDING %  Comprehensive metabolic panel     Status: Abnormal   Collection Time: 05/19/16 11:58 AM  Result Value Ref Range   Sodium 133 (L) 135 - 145 mmol/L   Potassium 3.9 3.5 - 5.1 mmol/L   Chloride 102 101 - 111 mmol/L   CO2 24 22 - 32 mmol/L   Glucose, Bld 121 (H) 65 - 99 mg/dL   BUN 13 6 - 20 mg/dL   Creatinine, Ser 0.87 0.44 - 1.00 mg/dL   Calcium 9.5 8.9 - 10.3 mg/dL   Total Protein 8.1 6.5 - 8.1 g/dL   Albumin 4.1 3.5 - 5.0 g/dL   AST 21 15 - 41 U/L   ALT 12 (L) 14 - 54 U/L   Alkaline Phosphatase 87 38 - 126 U/L   Total Bilirubin 0.2 (L) 0.3 - 1.2 mg/dL   GFR calc non Af Amer 59 (L) >60 mL/min   GFR calc Af Amer >60 >60 mL/min    Comment: (NOTE) The eGFR has been calculated using the CKD EPI equation. This calculation has not been validated in all clinical situations. eGFR's persistently <60 mL/min signify possible Chronic Kidney Disease.    Anion gap 7 5 - 15  Lipase, blood     Status: None   Collection Time: 05/19/16 11:58 AM  Result Value  Ref Range   Lipase 29 11 - 51 U/L    Dg Abd Acute W/chest  05/19/2016  CLINICAL DATA:  Abdominal pain with distention. EXAM: DG ABDOMEN ACUTE W/ 1V CHEST COMPARISON:  Chest x-ray 02/13/2016. FINDINGS: Low lung volumes  with stable asymmetric elevation right hemidiaphragm. Basilar atelectasis noted, right greater than left. Prominent skin fold seen over the left lung. Cardiopericardial silhouette is at upper limits of normal for size. Bones are diffusely demineralized. Right-side-up decubitus film shows no evidence of intraperitoneal free air. Markedly dilated colonic loop in the left abdomen with some scattered gaseous small bowel distention. IMPRESSION: 1. Low volume chest x-ray with basilar atelectasis. 2. Marked gaseous distention of colon in the left abdomen. Configuration of the dilated colonic loop suggests sigmoid volvulus. 3. No intraperitoneal free air Electronically Signed   By: Misty Stanley M.D.   On: 05/19/2016 11:49    Review of Systems  Constitutional: Positive for malaise/fatigue.  HENT: Negative.   Eyes: Negative.   Respiratory: Negative.   Cardiovascular: Negative.   Gastrointestinal: Positive for nausea, abdominal pain and constipation. Negative for vomiting.  Genitourinary: Negative.   Musculoskeletal: Negative.        Left leg swells most of the time  Skin: Negative.   Neurological: Positive for focal weakness (lower extremity weakness).  Endo/Heme/Allergies: Negative.   Psychiatric/Behavioral: Negative.    Blood pressure 165/90, pulse 95, temperature 97.9 F (36.6 C), temperature source Oral, resp. rate 18, SpO2 99 %. Physical Exam  Constitutional: She is oriented to person, place, and time. No distress.  Chronically ill elderly female with ongoing abdominal discomfort.    HENT:  Head: Normocephalic and atraumatic.  Nose: Nose normal.  Eyes: Right eye exhibits no discharge. Left eye exhibits no discharge. No scleral icterus.  Neck: No JVD present. No tracheal  deviation present. No thyromegaly present.  Cardiovascular: Normal rate and regular rhythm.   Murmur (systolic mumur at the base and the apex.  ) heard. Respiratory: Effort normal and breath sounds normal. No respiratory distress. She has no wheezes. She has no rales. She exhibits no tenderness.  GI: Soft. She exhibits distension. She exhibits no mass. There is tenderness. There is no rebound and no guarding.  No BS currently  Musculoskeletal: She exhibits edema (left greater than the right, trace to +1).  Lymphadenopathy:    She has no cervical adenopathy.  Neurological: She is alert and oriented to person, place, and time. No cranial nerve deficit.  Skin: Skin is warm and dry. No rash noted. She is not diaphoretic. No erythema. No pallor.  Psychiatric: She has a normal mood and affect. Her behavior is normal. Judgment and thought content normal.    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 Excell Seltzer.  It is his opinion decompression by GI should be completed this afternoon.  She is not perforated at this point.  She would do poorly with any surgery and the family is not interested in any elective procedures.  She is being admitted by Medicine.  We have contacted GI and they are seeing the patient also. We will follow with you.      Emilyrose Darrah 05/19/2016, 12:45 PM

## 2016-05-19 NOTE — Progress Notes (Signed)
Dr. Scarlette Shorts paged to confirm order about placing blakemore tube vs NG tube noted in MD notes.  Per Dr. Henrene Pastor keep patient NPO and do not place NG tube for now.  Patient abdomen is soft and non-tender, she is not complaining of abdominal pain or nausea.  Will continue to monitor closely.

## 2016-05-19 NOTE — ED Notes (Signed)
Pt reports lower abd pain and diarrhea that began last night. Recent diagnosis of lymphoma has not started treatment yet.

## 2016-05-19 NOTE — ED Notes (Signed)
Attempted x 2 to obtain an IV.

## 2016-05-19 NOTE — ED Provider Notes (Signed)
CSN: [id number]     Arrival date & time 05/19/16  1015 History   First MD Initiated Contact with Patient 05/19/16 1034     Chief Complaint  Patient presents with  . Abdominal Pain     (Consider location/radiation/quality/duration/timing/severity/associated sxs/prior Treatment) HPI   Blood pressure 165/90, pulse 95, temperature 97.9 F (36.6 C), temperature source Oral, resp. rate 18, SpO2 99 %.  Erika Day is a 82 y.o. female suddenly diagnosed with non-Hodgkin lymphoma complaining of severe diffuse colicky abdominal pain rated at 7 out of 10 right now, 10 out of 10 at worst, no pain medications taken prior to arrival. She feels that she is constipated, she is a suppository at home with some relief and she had diarrhea which was nonbloody and non-melanotic. She denies fevers, chills, nausea, vomiting states that she is urinating slightly less than normal. She was on Augmentin for UTI 1 month ago.   Past Medical History  Diagnosis Date  . Acid reflux disease   . Panic attacks   . Hypertension   . DDD (degenerative disc disease), lumbar   . Dyslipidemia   . Degenerative joint disease   . Diabetes mellitus without complication ([place])   . Stroke [facility]) age 66 yo    poorly controlled DM and Hypertension  . Cancer [facility])     Looking for documentation of this  . Pneumonia 02/2016  . CLL (chronic lymphocytic leukemia) ([place]) 05/15/2016  . 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 Laparotomy N/A 04/29/2014    Procedure: EXPLORATORY LAPAROTOMY;  Surgeon: Imogene Burn. Georgette Dover, MD;  Location: [place];  Service: General;  Laterality: N/A;  . Ventral hernia repair N/A 04/29/2014    Procedure: PRIMARY REPAIR OF VENTRAL HERNIA;  Surgeon: Imogene Burn. Georgette Dover, MD;  Location: [place];  Service: General;  Laterality: N/A;  . Lysis of adhesion N/A 04/29/2014    Procedure: EXTENSIVE  LYSIS OF ADHESIONS;  Surgeon: Imogene Burn. Georgette Dover, MD;  Location: [place];  Service: General;  Laterality: N/A;  . Cholecystectomy    . Abdominal hysterectomy  ? 08/25/2011 ?    Has BSO for left benign ovarian tumor with torsion and broad ligament benign tumor, but does not appear had hysterectomy--[facility], NOT ovarian cancer   Family History  Problem Relation Age of Onset  . Hypertension Mother   . Stroke Mother   . Arthritis Mother   . Alcohol abuse Father    Social History  Substance Use Topics  . Smoking status: Former Smoker    Types: Cigarettes    Quit date: 02/18/1990  . Smokeless tobacco: Former Systems developer    Types: Snuff    Quit date: 02/18/1990  . Alcohol Use: No     Comment: Only drank alcohol between ages of 5 and 55 yo.  Her father gave it to her   OB History    No data available     Review of Systems    Allergies  Morphine and related  Home Medications   Prior to Admission medications   Medication Sig Start Date End Date Taking? Authorizing Provider  aspirin EC 325 MG tablet Take 325 mg by mouth daily.      Historical Provider, MD  cetirizine (ZYRTEC) 10 MG tablet Take 10 mg by mouth daily as needed for allergies. For allergies. 12/23/15   Historical Provider, MD  furosemide (LASIX) 20  MG tablet Take 20 mg by mouth daily as needed for fluid.    Historical Provider, MD  lisinopril (PRINIVIL,ZESTRIL) 10 MG tablet Take 10 mg by mouth daily. Reported on 01/31/2016    Historical Provider, MD  pantoprazole (PROTONIX) 40 MG tablet Take 40 mg by mouth daily.    Historical Provider, MD  prednisoLONE acetate (PRED FORTE) 1 % ophthalmic suspension Place 1-2 drops into both eyes See admin instructions. 2 drops in right eye and 1 drop in the left eye twice daily    Historical Provider, MD   BP 165/90 mmHg  Pulse 95  Temp(Src) 97.9 F (36.6 C) (Oral)  Resp 18  SpO2 99% Physical Exam  Constitutional: She is oriented to person, place, and time. She appears well-developed and  well-nourished. No distress.  HENT:  Head: Normocephalic and atraumatic.  Mouth/Throat: Oropharynx is clear and moist.  Eyes: Conjunctivae and EOM are normal. Pupils are equal, round, and reactive to light.  Neck: Normal range of motion.  Cardiovascular: Normal rate, regular rhythm and intact distal pulses.   Pulmonary/Chest: Effort normal and breath sounds normal.  Abdominal: Soft. There is no tenderness.  Hyperactive bowel sounds, diffusely tender to palpation with no guarding or rebound.  Musculoskeletal: Normal range of motion.  Neurological: She is alert and oriented to person, place, and time.  Skin: She is not diaphoretic.  Psychiatric: She has a normal mood and affect.  Nursing note and vitals reviewed.   ED Course  Procedures (including critical care time) Labs Review Labs Reviewed - No data to display  Imaging Review No results found. I have personally reviewed and evaluated these images and lab results as part of my medical decision-making.   EKG Interpretation None      MDM   Final diagnoses:  Sigmoid volvulus ([place])    Filed Vitals:   05/19/16 1022 05/19/16 1028  BP: 153/78 165/90  Pulse: 95 95  Temp: 98.4 F (36.9 C) 97.9 F (36.6 C)  TempSrc: Oral Oral  Resp: 17 18  SpO2: 95% 99%    Medications  lidocaine (XYLOCAINE) 2 % jelly 1 application (1 application Other Not Given 05/19/16 1211)  HYDROmorphone (DILAUDID) injection 0.5 mg (not administered)  ondansetron (ZOFRAN) injection 4 mg (not administered)  HYDROcodone-acetaminophen (NORCO/VICODIN) 5-325 MG per tablet 1 tablet (1 tablet Oral Given 05/19/16 1124)  sodium chloride 0.9 % bolus 500 mL (500 mLs Intravenous New Bag/Given 05/19/16 1202)    Erika Day is 82 y.o. female presenting with Severe abdominal pain and constipation onset several days ago, abdominal exam is nonfocal. Patient is nontoxic appearing. Blood work reassuring with no leukocytosis. Acute abdominal series shows severe  distention consistent with a sigmoid volvulus.  Case with general surgery APP Will Creig Hines commenced we consult GI, get a medical admission, they will come to evaluate the patient and consult.  Discuss case with AP P Jennifer from GI they will take the patient for decompression. Patient is admitted to [facility] hospitalist Dr. Margarette Canada Lumen Brinlee, PA-C 05/19/16 1442   Charlesetta Shanks, MD 06/02/16 [id number]

## 2016-05-19 NOTE — ED Notes (Signed)
Pt was informed about urinalysis

## 2016-05-19 NOTE — Op Note (Signed)
[facility] Patient Name: Erika Day Procedure Date: 05/19/2016 MRN: [id number] Attending MD: [name] , MD Date of Birth: Dec 11, 1929 CSN: [id number] Age: 82 Admit Type: Outpatient Procedure:                Flexible Sigmoidoscopy Indications:              For therapy of volvulus Providers:                [name], MD, Kingsley Plan, RN, Elspeth Cho Tech., Technician Referring MD:              Medicines:                Fentanyl 25 micrograms IV, Midazolam 2 mg IV Complications:            No immediate complications. Estimated blood loss:                            None. Estimated Blood Loss:     Estimated blood loss: none. Procedure:                Pre-Anesthesia Assessment:                           - Prior to the procedure, a History and Physical                            was performed, and patient medications and                            allergies were reviewed. The patient's tolerance of                            previous anesthesia was also reviewed. The risks                            and benefits of the procedure and the sedation                            options and risks were discussed with the patient.                            All questions were answered, and informed consent                            was obtained. Prior Anticoagulants: The patient has                            taken no previous anticoagulant or antiplatelet                            agents. ASA Grade Assessment: IV - A patient with  severe systemic disease that is a constant threat                            to life. After reviewing the risks and benefits,                            the patient was deemed in satisfactory condition to                            undergo the procedure.                           After obtaining informed consent, the scope was                            passed under direct  vision. The [id number]                            scope was introduced through the anus and advanced                            to the the splenic flexure. The flexible                            sigmoidoscopy was accomplished without difficulty.                            The patient tolerated the procedure well. The                            quality of the bowel preparation was adequate. Scope In: Scope Out: Findings:      The twist of volvulus was located in proximal sigmoid, about 25cm from       anus. The mucosa in this area was normal appearing. There was a large       amount of liquid and solid stool distally. I was able to pass the adult       colonoscope more proximally into the colon. There the lumen was very       dilated, filled with air and small amount of liquid stool. The mucosa in       proximal colon (examined up to about the splenic flexure) was slightly       friable, erythematous appearing. There were certainly no signficant       ischemic changes. The air and liquid stool was suctioned from the colon       with very good results, her abdomen was soft throughout aftwards. Impression:               - Sigmoid volvulus. Successful complete                            decompression was achieved with suctioning of air,                            liquid stool. The colon had only very mild ishcemic  changes proximal to the volvulus point.                           - No specimens collected. Moderate Sedation:      Moderate (conscious) sedation was administered by the endoscopy nurse       and supervised by the endoscopist. The following parameters were       monitored: oxygen saturation, heart rate, blood pressure, and response       to care. Total physician intraservice time was 10 minutes. Recommendation:           - Return patient to hospital ward for ongoing care.                           - Consider elective sigmoid resection, pexy.                            - Avoid narcotic pain meds, ambulate as much as                            possibly, keep bowel moving easily with daily                            miralax. Procedure Code(s):        --- Professional ---                           [phone], Sigmoidoscopy, flexible; with decompression                            (for pathologic distention) (eg, volvulus,                            megacolon), including placement of decompression                            tube, when performed                           99152, Moderate sedation services provided by the                            same physician or other qualified health care                            professional performing the diagnostic or                            therapeutic service that the sedation supports,                            requiring the presence of an independent trained                            observer to assist in the monitoring of the  patient's level of consciousness and physiological                            status; initial 15 minutes of intraservice time,                            patient age 13 years or older Diagnosis Code(s):        --- Professional ---                           K56.2, Volvulus CPT copyright 2016 American [employer]. All rights reserved. The codes documented in this report are preliminary and upon coder review may  be revised to meet current compliance requirements. [name], MD 05/19/2016 3:19:34 PM This report has been signed electronically. Number of Addenda: 0

## 2016-05-19 NOTE — Consult Note (Signed)
Consultation  Referring Provider: Dr. Johnney Killian     Primary Care Physician:  No primary care provider on file. Primary Gastroenterologist:   None      Reason for Consultation: Sigmoid volvulus             HPI:   Erika Day is an 82 y.o. African-American female with past medical history significant for degenerative disc disease, diabetes, stroke, reflux, chronic constipation and recent diagnosis of non-Hodgkin lymphoma, who presented to the ER today with a chief complaint of increasing abdominal pain. The patient is surrounded by her family members who do provide her history.  Today, they explain that the patient has had chronic constipation for over 20 years and has only passed a very "little amount of stool which was mostly water" over the past 2 days, this is not a great change from her usual. She is on multiple laxatives to help with this. Their main concern is that the patient has been complaining of a increasing abdominal pain which apparently awoke her from her sleep this morning. Per previous physician's notes this has been "colicky in nature and ranges from a 7/10 to a 10/10 at its worst". Her family explains that she has also been vomiting for the past couple of weeks "off and on". They tell me she has had a weight loss of over 20 pounds in the past "few months". They did discuss that she was recently diagnosed with non- Hodgkin's lymphoma last week and is currently undergoing further workup.  Patient's surgical history is extensive with multiple intra-abdominal approaches, see below. One previous episode of "bowel obstruction" per family in 2015-with surgery at the time and what sounds like lysis of adhesions.  Patient denies fever, chills, melena, hematochezia or hematemesis.  No previous GI workup  Past Medical History  Diagnosis Date  . Acid reflux disease   . Panic attacks   . Hypertension   . DDD (degenerative disc disease), lumbar   . Dyslipidemia   . Degenerative  joint disease   . Diabetes mellitus without complication ([place])   . Stroke [facility]) age 46 yo    poorly controlled DM and Hypertension  . Cancer [facility])     Looking for documentation of this  . Pneumonia 02/2016  . CLL (chronic lymphocytic leukemia) ([place]) 05/15/2016  . 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 Laparotomy N/A 04/29/2014    Procedure: EXPLORATORY LAPAROTOMY;  Surgeon: Imogene Burn. Georgette Dover, MD;  Location: [place];  Service: General;  Laterality: N/A;  . Ventral hernia repair N/A 04/29/2014    Procedure: PRIMARY REPAIR OF VENTRAL HERNIA;  Surgeon: Imogene Burn. Georgette Dover, MD;  Location: [place];  Service: General;  Laterality: N/A;  . Lysis of adhesion N/A 04/29/2014    Procedure: EXTENSIVE LYSIS OF ADHESIONS;  Surgeon: Imogene Burn. Georgette Dover, MD;  Location: [place];  Service: General;  Laterality: N/A;  . Cholecystectomy    . Abdominal hysterectomy  ? 08/25/2011 ?    Has BSO for left benign ovarian tumor with torsion and broad ligament benign tumor, but does not appear had hysterectomy--[facility], NOT ovarian cancer    Family History  Problem Relation Age of Onset  . Hypertension Mother   . Stroke Mother   . Arthritis Mother   . Alcohol abuse Father     Social History  Substance Use Topics  .  Smoking status: Former Smoker    Types: Cigarettes    Quit date: 02/18/1990  . Smokeless tobacco: Former Systems developer    Types: Snuff    Quit date: 02/18/1990  . Alcohol Use: No     Comment: Only drank alcohol between ages of 56 and 54 yo.  Her father gave it to her    Prior to Admission medications   Medication Sig Start Date End Date Taking? Authorizing Provider  aspirin EC 325 MG tablet Take 325 mg by mouth daily.      Historical Provider, MD  cetirizine (ZYRTEC) 10 MG tablet Take 10 mg by mouth daily as needed for allergies. For allergies. 12/23/15   Historical Provider, MD  furosemide  (LASIX) 20 MG tablet Take 20 mg by mouth daily as needed for fluid.    Historical Provider, MD  lisinopril (PRINIVIL,ZESTRIL) 10 MG tablet Take 10 mg by mouth daily. Reported on 01/31/2016    Historical Provider, MD  pantoprazole (PROTONIX) 40 MG tablet Take 40 mg by mouth daily.    Historical Provider, MD  prednisoLONE acetate (PRED FORTE) 1 % ophthalmic suspension Place 1-2 drops into both eyes See admin instructions. 2 drops in right eye and 1 drop in the left eye twice daily    Historical Provider, MD    Current Facility-Administered Medications  Medication Dose Route Frequency Provider Last Rate Last Dose  . 0.9 %  sodium chloride infusion   Intravenous Continuous Nicole Pisciotta, PA-C      . lidocaine (XYLOCAINE) 2 % jelly 1 application  1 application Other Once Monico Blitz, PA-C   1 application at [id number]   Current Outpatient Prescriptions  Medication Sig Dispense Refill  . aspirin EC 325 MG tablet Take 325 mg by mouth daily.      . cetirizine (ZYRTEC) 10 MG tablet Take 10 mg by mouth daily as needed for allergies. For allergies.  2  . furosemide (LASIX) 20 MG tablet Take 20 mg by mouth daily as needed for fluid.    Marland Kitchen lisinopril (PRINIVIL,ZESTRIL) 10 MG tablet Take 10 mg by mouth daily. Reported on 01/31/2016    . pantoprazole (PROTONIX) 40 MG tablet Take 40 mg by mouth daily.    . prednisoLONE acetate (PRED FORTE) 1 % ophthalmic suspension Place 1-2 drops into both eyes See admin instructions. 2 drops in right eye and 1 drop in the left eye twice daily      Allergies as of 05/19/2016 - Review Complete 05/19/2016  Allergen Reaction Noted  . Morphine and related Other (See Comments) 04/23/2014     Review of Systems:    Constitutional: Positive for weight loss of approximately 20 pounds in the past few months per family No fever or chills HEENT: Eyes: No change in vision               Ears, Nose, Throat:  No change in hearing Skin: No rash or itching Cardiovascular: No  chest pain or palpitations     Respiratory: No SOB or cough Gastrointestinal: See HPI and otherwise negative Genitourinary: No dysuria Neurological: No headache or dizziness Musculoskeletal: No muscle or back pain Psychiatric: No history of depression or anxiety    Physical Exam:  Vital signs in last 24 hours: Temp:  [97.9 F (36.6 C)-98.4 F (36.9 C)] 97.9 F (36.6 C) (07/18 1028) Pulse Rate:  [95] 95 (07/18 1028) Resp:  [17-18] 18 (07/18 1028) BP: (153-165)/(78-90) 165/90 mmHg (07/18 1028) SpO2:  [95 %-99 %] 99 % (07/18 1028)  General:   African-American female appears to be in NAD, Well developed, Well nourished, alert and cooperative Head:  Normocephalic and atraumatic. Eyes:   PEERL, EOMI. No icterus. Conjunctiva pink. Arcus senilis bilaterally Ears:  Normal auditory acuity. Neck:  Supple Throat: Oral cavity and pharynx without inflammation, swelling or lesion. EGD edentulous Lungs: Respirations even and unlabored. Lungs clear to auscultation bilaterally.   No wheezes, crackles, or rhonchi.  Heart: Normal S1, S2. Grade 3/6 systolic ejection murmur, Regular rate and rhythm. No peripheral edema, cyanosis or pallor.  Abdomen:  Soft, non-distended, generalized moderate TTP worse in the epigastrium No rebound or guarding. Decreased bowel sounds all 4 quadrants No appreciable masses or hepatomegaly. Rectal:  Not performed.  Msk:  Kyphosis, Symmetrical without gross deformities. Peripheral pulses intact.  Extremities:  Without edema, no deformity or joint abnormality. Normal ROM, normal sensation. Neurologic:  Alert and  oriented x2;  grossly normal neurologically.  Skin:   Dry and intact without significant lesions or rashes. Psychiatric: Oriented to person, place and time. Coherent but with some memory problems, history is greatly provided by family members in the room   LAB RESULTS:  Recent Labs  05/19/16 1158  WBC 9.1  HGB 11.4*  HCT 34.5*  PLT 132*   BMET  Recent  Labs  05/19/16 1158  NA 133*  K 3.9  CL 102  CO2 24  GLUCOSE 121*  BUN 13  CREATININE 0.87  CALCIUM 9.5   LFT  Recent Labs  05/19/16 1158  PROT 8.1  ALBUMIN 4.1  AST 21  ALT 12*  ALKPHOS 87  BILITOT 0.2*   PT/INR No results for input(s): LABPROT, INR in the last 72 hours.  STUDIES: Dg Abd Acute W/chest  05/19/2016  CLINICAL DATA:  Abdominal pain with distention. EXAM: DG ABDOMEN ACUTE W/ 1V CHEST COMPARISON:  Chest x-ray 02/13/2016. FINDINGS: Low lung volumes with stable asymmetric elevation right hemidiaphragm. Basilar atelectasis noted, right greater than left. Prominent skin fold seen over the left lung. Cardiopericardial silhouette is at upper limits of normal for size. Bones are diffusely demineralized. Right-side-up decubitus film shows no evidence of intraperitoneal free air. Markedly dilated colonic loop in the left abdomen with some scattered gaseous small bowel distention. IMPRESSION: 1. Low volume chest x-ray with basilar atelectasis. 2. Marked gaseous distention of colon in the left abdomen. Configuration of the dilated colonic loop suggests sigmoid volvulus. 3. No intraperitoneal free air Electronically Signed   By: Misty Stanley M.D.   On: 05/19/2016 11:49     PREVIOUS ENDOSCOPIES:            None   Impression / Plan:  Impression: 1. Generalized abdominal pain: 2 day history of abdominal pain which is worsening, patient has also been vomiting off and on for the past couple of weeks per family, x-ray shows likely sigmoid volvulus 2. Vomiting: See above, likely related 3. Chronic constipation: Per family members at bedside this is been going on for over 20 years, large difference recently 4. New diagnosis of non-Hodgkin's lymphoma last week: The patient is still undergoing workup for this and has not started any form of treatment  Plan: 1. Plan for flex sigmoidoscopy for decompression this afternoon with Dr. Ardis Hughs. Discussed risks, benefits and limitations  with the family, they agree to proceed. 2. Patient to remain nothing by mouth until after time of procedure, ordered NG tube placement 3. Agree with surgical consultation 4. Please await any further recommendations after time procedure she day  Thank you  for your kind consultation, we will continue to follow.  Lavone Nian [facility]  05/19/2016, 12:59 PM Pager #: [phone]  ________________________________________________________________________  Velora Heckler GI MD note:  I personally examined the patient, reviewed the data and agree with the assessment and plan described above.  Planning for flex sigmoidoscopy today to decompress the volvulus.   Owens Loffler, MD [facility] Pager [phone]

## 2016-05-19 NOTE — H&P (Signed)
History and Physical  Erika Day [id number] DOB: [birth year] DOA: 05/19/2016  Referring physician: EDP PCP: No primary care provider on file.   Chief Complaint: abdominal pain, n/v  HPI: Erika Day is a 82 y.o. female  Who is from home, baseline wheelchair bound, With h/o uterine tumor s/p resection and multiple other abdominal surgeries as listed in her past surgical history, she also has remote h/o cva when she was 36, THN, ? Diabetes, chronic constipation for 35yrs, recently diagnosis of lymphoma , has not started treatment yet. She presented to [facility] ED due to above complaints, she reported started to have abdominal pain a few days ago with feeling nauseous, today the pain increase which awoke her from sleep this morning, pain is intermittent, she has small amount of loose stool for the past two day, no fever. Ab x ray suggest sigmoid volvulus, EDP called GI /general surgery and hospitalist called to admit the patient.   Review of Systems:  Detail per HPI, Review of systems are otherwise negative  Past Medical History  Diagnosis Date  . Acid reflux disease   . Panic attacks   . Hypertension   . DDD (degenerative disc disease), lumbar   . Dyslipidemia   . Degenerative joint disease   . Diabetes mellitus without complication ([place])   . Stroke [facility]) age 73 yo    poorly controlled DM and Hypertension  . Cancer [facility])     Looking for documentation of this  . Pneumonia 02/2016  . CLL (chronic lymphocytic leukemia) ([place]) 05/15/2016  . 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 Laparotomy N/A 04/29/2014    Procedure: EXPLORATORY LAPAROTOMY;  Surgeon: Imogene Burn. Georgette Dover, MD;  Location: [place];  Service: General;  Laterality: N/A;  . Ventral hernia repair N/A 04/29/2014    Procedure: PRIMARY REPAIR OF VENTRAL HERNIA;  Surgeon: Imogene Burn. Georgette Dover, MD;  Location: [place];  Service: General;  Laterality: N/A;  . Lysis of adhesion N/A 04/29/2014    Procedure: EXTENSIVE LYSIS OF ADHESIONS;  Surgeon: Imogene Burn. Georgette Dover, MD;  Location: [place];  Service: General;  Laterality: N/A;  . Cholecystectomy    . Abdominal hysterectomy  ? 08/25/2011 ?    Has BSO for left benign ovarian tumor with torsion and broad ligament benign tumor, but does not appear had hysterectomy--[facility], NOT ovarian cancer   Social History:  reports that she quit smoking about 26 years ago. Her smoking use included Cigarettes. She quit smokeless tobacco use about 26 years ago. Her smokeless tobacco use included Snuff. She reports that she does not drink alcohol or use illicit drugs. Patient lives at home with her husband, she is wheelchair bound for more than 5years  Allergies  Allergen Reactions  . Morphine And Related Other (See Comments)    Blood sugar dropped one-time    Family History  Problem Relation Age of Onset  . Hypertension Mother   . Stroke Mother   . Arthritis Mother   . Alcohol abuse Father       Prior to Admission medications   Medication Sig Start Date End Date Taking? Authorizing Provider  aspirin EC 325 MG tablet Take 325 mg by mouth daily.      Historical Provider, MD  cetirizine (ZYRTEC) 10 MG tablet Take 10 mg by mouth daily as needed for allergies. For allergies. 12/23/15   Historical  Provider, MD  furosemide (LASIX) 20 MG tablet Take 20 mg by mouth daily as needed for fluid.    Historical Provider, MD  lisinopril (PRINIVIL,ZESTRIL) 10 MG tablet Take 10 mg by mouth daily. Reported on 01/31/2016    Historical Provider, MD  pantoprazole (PROTONIX) 40 MG tablet Take 40 mg by mouth daily.    Historical Provider, MD  prednisoLONE acetate (PRED FORTE) 1 % ophthalmic suspension Place 1-2 drops into both eyes See admin instructions. 2 drops in right eye and 1 drop in the left eye twice daily    Historical Provider, MD    Physical Exam: BP 165/90 mmHg  Pulse 95  Temp(Src)  97.9 F (36.6 C) (Oral)  Resp 18  SpO2 99%  General:  Frail, chronically ill appearing Eyes: PERRL ENT: dry oral mucosa, edentulous Neck: supple, no JVD Cardiovascular: RRR, + precordial murmur, seems more prominent at mitral valve region Respiratory: CTABL Abdomen:  Distended, diffuse tender, no guarding, no rebound, decreased bowel sounds Skin: no rash Musculoskeletal:  Left lower extremity edema, slightly warm to touch Psychiatric: calm/cooperative Neurologic: aaox3           Labs on Admission:  Basic Metabolic Panel:  Recent Labs Lab 05/15/16 1233 05/19/16 1158  NA 139 133*  K 4.4 3.9  CL  --  102  CO2 24 24  GLUCOSE 127 121*  BUN 11.6 13  CREATININE 1.0 0.87  CALCIUM 9.5 9.5   Liver Function Tests:  Recent Labs Lab 05/15/16 1233 05/19/16 1158  AST 13 21  ALT <9 12*  ALKPHOS 83 87  BILITOT 0.30 0.2*  PROT 7.6 8.1  ALBUMIN 3.4* 4.1    Recent Labs Lab 05/19/16 1158  LIPASE 29   No results for input(s): AMMONIA in the last 168 hours. CBC:  Recent Labs Lab 05/15/16 1233 05/19/16 1158  WBC 8.3 9.1  NEUTROABS 1.6 PENDING  HGB 10.4* 11.4*  HCT 31.7* 34.5*  MCV 85.1 85.0  PLT 127* 132*   Cardiac Enzymes: No results for input(s): CKTOTAL, CKMB, CKMBINDEX, TROPONINI in the last 168 hours.  BNP (last 3 results)  Recent Labs  02/13/16 0350  BNP 443.6*    ProBNP (last 3 results) No results for input(s): PROBNP in the last 8760 hours.  CBG: No results for input(s): GLUCAP in the last 168 hours.  Radiological Exams on Admission: Dg Abd Acute W/chest  05/19/2016  CLINICAL DATA:  Abdominal pain with distention. EXAM: DG ABDOMEN ACUTE W/ 1V CHEST COMPARISON:  Chest x-ray 02/13/2016. FINDINGS: Low lung volumes with stable asymmetric elevation right hemidiaphragm. Basilar atelectasis noted, right greater than left. Prominent skin fold seen over the left lung. Cardiopericardial silhouette is at upper limits of normal for size. Bones are diffusely  demineralized. Right-side-up decubitus film shows no evidence of intraperitoneal free air. Markedly dilated colonic loop in the left abdomen with some scattered gaseous small bowel distention. IMPRESSION: 1. Low volume chest x-ray with basilar atelectasis. 2. Marked gaseous distention of colon in the left abdomen. Configuration of the dilated colonic loop suggests sigmoid volvulus. 3. No intraperitoneal free air Electronically Signed   By: Misty Stanley M.D.   On: 05/19/2016 11:49     Assessment/Plan Present on Admission:  . Sigmoid volvulus ([place])   Sigmoid volvulus: keep npo, iv analgesics ( patient reported morphine drop her blood pressure significantly in the past, she tolerated hydrocodone and iv dilaudid in the ED), antiemetics, GI to decompress with endoscope, general surgery following.   Mild hyponatremia: likely from dehydration,  on ivf  Cardiac murmur:  Echo ordered  Lower extremity edema: on lasix prn at home, currently left lower extremity edema, no right lower extremity edema, will get venous US to r/o DVT  Acid reflux: iv pepcid   HTN /remote h/o CVA when she was 36, asa /lisinopril held due to npo, prn hydralazine ordered  H/o DM per chart review; a1c was 5.4  in 2015 , she is not on any meds for it at home   FTT, has been wheelchair bound for more than 5years  Recently diagnose of lymphoma, close followed by Dr Alvy Bimler.   DVT prophylaxis: scd due to anticipated procedure  Consultants: [facility]/General surgery  Code Status: full , confirmed  Family Communication:  Patient and family members in room  Disposition Plan: admit to med tele  Time spent: 68mins  Denni France MD, PhD [facility] Pager [phone] If 7PM-7AM, please contact night-coverage at [email or web address], password [facility]

## 2016-05-19 NOTE — ED Notes (Signed)
14:05 pt can go to floor.

## 2016-05-19 NOTE — H&P (View-Only) (Signed)
Consultation  Referring Provider: Dr. Johnney Killian     Primary Care Physician:  No primary care provider on file. Primary Gastroenterologist:   None      Reason for Consultation: Sigmoid volvulus             HPI:   Erika Day is an 82 y.o. African-American female with past medical history significant for degenerative disc disease, diabetes, stroke, reflux, chronic constipation and recent diagnosis of non-Hodgkin lymphoma, who presented to the ER today with a chief complaint of increasing abdominal pain. The patient is surrounded by her family members who do provide her history.  Today, they explain that the patient has had chronic constipation for over 20 years and has only passed a very "little amount of stool which was mostly water" over the past 2 days, this is not a great change from her usual. She is on multiple laxatives to help with this. Their main concern is that the patient has been complaining of a increasing abdominal pain which apparently awoke her from her sleep this morning. Per previous physician's notes this has been "colicky in nature and ranges from a 7/10 to a 10/10 at its worst". Her family explains that she has also been vomiting for the past couple of weeks "off and on". They tell me she has had a weight loss of over 20 pounds in the past "few months". They did discuss that she was recently diagnosed with non- Hodgkin's lymphoma last week and is currently undergoing further workup.  Patient's surgical history is extensive with multiple intra-abdominal approaches, see below. One previous episode of "bowel obstruction" per family in 2015-with surgery at the time and what sounds like lysis of adhesions.  Patient denies fever, chills, melena, hematochezia or hematemesis.  No previous GI workup  Past Medical History  Diagnosis Date  . Acid reflux disease   . Panic attacks   . Hypertension   . DDD (degenerative disc disease), lumbar   . Dyslipidemia   . Degenerative  joint disease   . Diabetes mellitus without complication ([place])   . Stroke [facility]) age 76 yo    poorly controlled DM and Hypertension  . Cancer [facility])     Looking for documentation of this  . Pneumonia 02/2016  . CLL (chronic lymphocytic leukemia) ([place]) 05/15/2016  . 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 Laparotomy N/A 04/29/2014    Procedure: EXPLORATORY LAPAROTOMY;  Surgeon: Imogene Burn. Georgette Dover, MD;  Location: [place];  Service: General;  Laterality: N/A;  . Ventral hernia repair N/A 04/29/2014    Procedure: PRIMARY REPAIR OF VENTRAL HERNIA;  Surgeon: Imogene Burn. Georgette Dover, MD;  Location: [place];  Service: General;  Laterality: N/A;  . Lysis of adhesion N/A 04/29/2014    Procedure: EXTENSIVE LYSIS OF ADHESIONS;  Surgeon: Imogene Burn. Georgette Dover, MD;  Location: [place];  Service: General;  Laterality: N/A;  . Cholecystectomy    . Abdominal hysterectomy  ? 08/25/2011 ?    Has BSO for left benign ovarian tumor with torsion and broad ligament benign tumor, but does not appear had hysterectomy--[facility], NOT ovarian cancer    Family History  Problem Relation Age of Onset  . Hypertension Mother   . Stroke Mother   . Arthritis Mother   . Alcohol abuse Father     Social History  Substance Use Topics  .  Smoking status: Former Smoker    Types: Cigarettes    Quit date: 02/18/1990  . Smokeless tobacco: Former Systems developer    Types: Snuff    Quit date: 02/18/1990  . Alcohol Use: No     Comment: Only drank alcohol between ages of 28 and 72 yo.  Her father gave it to her    Prior to Admission medications   Medication Sig Start Date End Date Taking? Authorizing Provider  aspirin EC 325 MG tablet Take 325 mg by mouth daily.      Historical Provider, MD  cetirizine (ZYRTEC) 10 MG tablet Take 10 mg by mouth daily as needed for allergies. For allergies. 12/23/15   Historical Provider, MD  furosemide  (LASIX) 20 MG tablet Take 20 mg by mouth daily as needed for fluid.    Historical Provider, MD  lisinopril (PRINIVIL,ZESTRIL) 10 MG tablet Take 10 mg by mouth daily. Reported on 01/31/2016    Historical Provider, MD  pantoprazole (PROTONIX) 40 MG tablet Take 40 mg by mouth daily.    Historical Provider, MD  prednisoLONE acetate (PRED FORTE) 1 % ophthalmic suspension Place 1-2 drops into both eyes See admin instructions. 2 drops in right eye and 1 drop in the left eye twice daily    Historical Provider, MD    Current Facility-Administered Medications  Medication Dose Route Frequency Provider Last Rate Last Dose  . 0.9 %  sodium chloride infusion   Intravenous Continuous Nicole Pisciotta, PA-C      . lidocaine (XYLOCAINE) 2 % jelly 1 application  1 application Other Once Monico Blitz, PA-C   1 application at [id number]   Current Outpatient Prescriptions  Medication Sig Dispense Refill  . aspirin EC 325 MG tablet Take 325 mg by mouth daily.      . cetirizine (ZYRTEC) 10 MG tablet Take 10 mg by mouth daily as needed for allergies. For allergies.  2  . furosemide (LASIX) 20 MG tablet Take 20 mg by mouth daily as needed for fluid.    Marland Kitchen lisinopril (PRINIVIL,ZESTRIL) 10 MG tablet Take 10 mg by mouth daily. Reported on 01/31/2016    . pantoprazole (PROTONIX) 40 MG tablet Take 40 mg by mouth daily.    . prednisoLONE acetate (PRED FORTE) 1 % ophthalmic suspension Place 1-2 drops into both eyes See admin instructions. 2 drops in right eye and 1 drop in the left eye twice daily      Allergies as of 05/19/2016 - Review Complete 05/19/2016  Allergen Reaction Noted  . Morphine and related Other (See Comments) 04/23/2014     Review of Systems:    Constitutional: Positive for weight loss of approximately 20 pounds in the past few months per family No fever or chills HEENT: Eyes: No change in vision               Ears, Nose, Throat:  No change in hearing Skin: No rash or itching Cardiovascular: No  chest pain or palpitations     Respiratory: No SOB or cough Gastrointestinal: See HPI and otherwise negative Genitourinary: No dysuria Neurological: No headache or dizziness Musculoskeletal: No muscle or back pain Psychiatric: No history of depression or anxiety    Physical Exam:  Vital signs in last 24 hours: Temp:  [97.9 F (36.6 C)-98.4 F (36.9 C)] 97.9 F (36.6 C) (07/18 1028) Pulse Rate:  [95] 95 (07/18 1028) Resp:  [17-18] 18 (07/18 1028) BP: (153-165)/(78-90) 165/90 mmHg (07/18 1028) SpO2:  [95 %-99 %] 99 % (07/18 1028)  General:   African-American female appears to be in NAD, Well developed, Well nourished, alert and cooperative Head:  Normocephalic and atraumatic. Eyes:   PEERL, EOMI. No icterus. Conjunctiva pink. Arcus senilis bilaterally Ears:  Normal auditory acuity. Neck:  Supple Throat: Oral cavity and pharynx without inflammation, swelling or lesion. EGD edentulous Lungs: Respirations even and unlabored. Lungs clear to auscultation bilaterally.   No wheezes, crackles, or rhonchi.  Heart: Normal S1, S2. Grade 3/6 systolic ejection murmur, Regular rate and rhythm. No peripheral edema, cyanosis or pallor.  Abdomen:  Soft, non-distended, generalized moderate TTP worse in the epigastrium No rebound or guarding. Decreased bowel sounds all 4 quadrants No appreciable masses or hepatomegaly. Rectal:  Not performed.  Msk:  Kyphosis, Symmetrical without gross deformities. Peripheral pulses intact.  Extremities:  Without edema, no deformity or joint abnormality. Normal ROM, normal sensation. Neurologic:  Alert and  oriented x2;  grossly normal neurologically.  Skin:   Dry and intact without significant lesions or rashes. Psychiatric: Oriented to person, place and time. Coherent but with some memory problems, history is greatly provided by family members in the room   LAB RESULTS:  Recent Labs  05/19/16 1158  WBC 9.1  HGB 11.4*  HCT 34.5*  PLT 132*   BMET  Recent  Labs  05/19/16 1158  NA 133*  K 3.9  CL 102  CO2 24  GLUCOSE 121*  BUN 13  CREATININE 0.87  CALCIUM 9.5   LFT  Recent Labs  05/19/16 1158  PROT 8.1  ALBUMIN 4.1  AST 21  ALT 12*  ALKPHOS 87  BILITOT 0.2*   PT/INR No results for input(s): LABPROT, INR in the last 72 hours.  STUDIES: Dg Abd Acute W/chest  05/19/2016  CLINICAL DATA:  Abdominal pain with distention. EXAM: DG ABDOMEN ACUTE W/ 1V CHEST COMPARISON:  Chest x-ray 02/13/2016. FINDINGS: Low lung volumes with stable asymmetric elevation right hemidiaphragm. Basilar atelectasis noted, right greater than left. Prominent skin fold seen over the left lung. Cardiopericardial silhouette is at upper limits of normal for size. Bones are diffusely demineralized. Right-side-up decubitus film shows no evidence of intraperitoneal free air. Markedly dilated colonic loop in the left abdomen with some scattered gaseous small bowel distention. IMPRESSION: 1. Low volume chest x-ray with basilar atelectasis. 2. Marked gaseous distention of colon in the left abdomen. Configuration of the dilated colonic loop suggests sigmoid volvulus. 3. No intraperitoneal free air Electronically Signed   By: Misty Stanley M.D.   On: 05/19/2016 11:49     PREVIOUS ENDOSCOPIES:            None   Impression / Plan:  Impression: 1. Generalized abdominal pain: 2 day history of abdominal pain which is worsening, patient has also been vomiting off and on for the past couple of weeks per family, x-ray shows likely sigmoid volvulus 2. Vomiting: See above, likely related 3. Chronic constipation: Per family members at bedside this is been going on for over 20 years, large difference recently 4. New diagnosis of non-Hodgkin's lymphoma last week: The patient is still undergoing workup for this and has not started any form of treatment  Plan: 1. Plan for flex sigmoidoscopy for decompression this afternoon with Dr. Ardis Hughs. Discussed risks, benefits and limitations  with the family, they agree to proceed. 2. Patient to remain nothing by mouth until after time of procedure, ordered NG tube placement 3. Agree with surgical consultation 4. Please await any further recommendations after time procedure she day  Thank you  for your kind consultation, we will continue to follow.  Lavone Nian [facility]  05/19/2016, 12:59 PM Pager #: [phone]  ________________________________________________________________________  Velora Heckler GI MD note:  I personally examined the patient, reviewed the data and agree with the assessment and plan described above.  Planning for flex sigmoidoscopy today to decompress the volvulus.   Owens Loffler, MD [facility] Gastroenterology Pager [phone]

## 2016-05-19 NOTE — Discharge Instructions (Signed)
Moderate Conscious Sedation, Adult, Care After Refer to this sheet in the next few weeks. These instructions provide you with information on caring for yourself after your procedure. Your health care provider may also give you more specific instructions. Your treatment has been planned according to current medical practices, but problems sometimes occur. Call your health care provider if you have any problems or questions after your procedure. WHAT TO EXPECT AFTER THE PROCEDURE  After your procedure:  You may feel sleepy, clumsy, and have poor balance for several hours.  Vomiting may occur if you eat too soon after the procedure. HOME CARE INSTRUCTIONS  Do not participate in any activities where you could become injured for at least 24 hours. Do not:  Drive.  Swim.  Ride a bicycle.  Operate heavy machinery.  Cook.  Use power tools.  Climb ladders.  Work from a high place.  Do not make important decisions or sign legal documents until you are improved.  If you vomit, drink water, juice, or soup when you can drink without vomiting. Make sure you have little or no nausea before eating solid foods.  Only take over-the-counter or prescription medicines for pain, discomfort, or fever as directed by your health care provider.  Make sure you and your family fully understand everything about the medicines given to you, including what side effects may occur.  You should not drink alcohol, take sleeping pills, or take medicines that cause drowsiness for at least 24 hours.  If you smoke, do not smoke without supervision.  If you are feeling better, you may resume normal activities 24 hours after you were sedated.  Keep all appointments with your health care provider. SEEK MEDICAL CARE IF:  Your skin is pale or bluish in color.  You continue to feel nauseous or vomit.  Your pain is getting worse and is not helped by medicine.  You have bleeding or swelling.  You are still  sleepy or feeling clumsy after 24 hours. SEEK IMMEDIATE MEDICAL CARE IF:  You develop a rash.  You have difficulty breathing.  You develop any type of allergic problem.  You have a fever. MAKE SURE YOU:  Understand these instructions.  Will watch your condition.  Will get help right away if you are not doing well or get worse.   This information is not intended to replace advice given to you by your health care provider. Make sure you discuss any questions you have with your health care provider.   Document Released: 08/09/2013 Document Revised: 11/09/2014 Document Reviewed: 08/09/2013 Elsevier Interactive Patient Education 2016 [place]. Flexible Sigmoidoscopy, Care After Refer to this sheet in the next few weeks. These instructions provide you with information on caring for yourself after your procedure. Your health care provider may also give you more specific instructions. Your treatment has been planned according to current medical practices, but problems sometimes occur. Call your health care provider if you have any problems or questions after your procedure. WHAT TO EXPECT AFTER THE PROCEDURE After your procedure, it is typical to have the following:   Abdominal cramps.  Bloating.  A small amount of rectal bleeding if you had a biopsy. HOME CARE INSTRUCTIONS  Only take over-the-counter or prescription medicines for pain, fever, or discomfort as directed by your health care provider.  Resume your normal diet and activities as directed by your health care provider. SEEK MEDICAL CARE IF:  You have abdominal pain or cramping that lasts longer than 1 hour after the procedure.  You continue to have small amounts of rectal bleeding after 24 hours.  You have nausea or vomiting.  You feel weak or dizzy. SEEK IMMEDIATE MEDICAL CARE IF:   You have a fever.  You pass large blood clots or see a large amount of blood in the toilet after having a bowel movement. This  may also occur 10-14 days after the procedure. It is more likely if you had a biopsy.  You develop abdominal pain that is not relieved with medicine or your abdominal pain gets worse.  You have nausea or vomiting for more than 24 hours after the procedure.   This information is not intended to replace advice given to you by your health care provider. Make sure you discuss any questions you have with your health care provider.   Document Released: 10/24/2013 Document Reviewed: 10/24/2013 Elsevier Interactive Patient Education [employer].

## 2016-05-19 NOTE — Progress Notes (Signed)
  Echocardiogram 2D Echocardiogram has been performed.  Erika Day 05/19/2016, 4:37 PM

## 2016-05-20 ENCOUNTER — Inpatient Hospital Stay (HOSPITAL_COMMUNITY): Payer: Medicare Other

## 2016-05-20 ENCOUNTER — Encounter (HOSPITAL_COMMUNITY): Payer: Self-pay | Admitting: Gastroenterology

## 2016-05-20 DIAGNOSIS — L8990 Pressure ulcer of unspecified site, unspecified stage: Secondary | ICD-10-CM | POA: Insufficient documentation

## 2016-05-20 DIAGNOSIS — E871 Hypo-osmolality and hyponatremia: Secondary | ICD-10-CM

## 2016-05-20 DIAGNOSIS — E43 Unspecified severe protein-calorie malnutrition: Secondary | ICD-10-CM

## 2016-05-20 DIAGNOSIS — R600 Localized edema: Secondary | ICD-10-CM

## 2016-05-20 DIAGNOSIS — K562 Volvulus: Secondary | ICD-10-CM | POA: Insufficient documentation

## 2016-05-20 DIAGNOSIS — R609 Edema, unspecified: Secondary | ICD-10-CM

## 2016-05-20 DIAGNOSIS — E1165 Type 2 diabetes mellitus with hyperglycemia: Secondary | ICD-10-CM

## 2016-05-20 LAB — BASIC METABOLIC PANEL
Anion gap: 5 (ref 5–15)
BUN: 12 mg/dL (ref 6–20)
CHLORIDE: 107 mmol/L (ref 101–111)
CO2: 24 mmol/L (ref 22–32)
Calcium: 9.1 mg/dL (ref 8.9–10.3)
Creatinine, Ser: 0.92 mg/dL (ref 0.44–1.00)
GFR calc Af Amer: >60 mL/min (ref >60)
GFR calc non Af Amer: 56 mL/min — ABNORMAL LOW (ref >60)
GLUCOSE: 63 mg/dL — AB (ref 65–99)
POTASSIUM: 4.2 mmol/L (ref 3.5–5.1)
Sodium: 136 mmol/L (ref 135–145)

## 2016-05-20 LAB — CBC
HEMATOCRIT: 29.9 % — AB (ref 36.0–46.0)
HEMOGLOBIN: 10 g/dL — AB (ref 12.0–15.0)
MCH: 28.4 pg (ref 26.0–34.0)
MCHC: 33.4 g/dL (ref 30.0–36.0)
MCV: 84.9 fL (ref 78.0–100.0)
PLATELETS: 122 10*3/uL — AB (ref 150–400)
RBC: 3.52 MIL/uL — AB (ref 3.87–5.11)
RDW: 13.6 % (ref 11.5–15.5)
WBC: 11.3 10*3/uL — ABNORMAL HIGH (ref 4.0–10.5)

## 2016-05-20 LAB — URINALYSIS, ROUTINE W REFLEX MICROSCOPIC
Bilirubin Urine: NEGATIVE
GLUCOSE, UA: NEGATIVE mg/dL
Hgb urine dipstick: NEGATIVE
KETONES UR: NEGATIVE mg/dL
NITRITE: NEGATIVE
PROTEIN: NEGATIVE mg/dL
Specific Gravity, Urine: 1.01 (ref 1.005–1.030)
pH: 6 (ref 5.0–8.0)

## 2016-05-20 LAB — URINE MICROSCOPIC-ADD ON: RBC / HPF: NONE SEEN RBC/hpf (ref 0–5)

## 2016-05-20 LAB — TISSUE HYBRIDIZATION TO NCBH

## 2016-05-20 NOTE — Progress Notes (Signed)
Progress Note   Subjective  Chief Complaint: Sigmoid Volvulus  Erika Day is an 82 year old African-American female who presented to the ER on 05/19/16 with a complaint of abdominal pain and was found to have a sigmoid volvulus via CT. Patient did undergo flexible sigmoidoscopy sigmoidoscopy with decompression on 05/19/16.  This morning, the patient tells me that she has no further abdominal pain. She has expressed that she had a large amount of "loose liquidy stool" last night. This morning she has had no further bowel movements but has been passing gas. She tells me that she is hungry.    Objective   Vital signs in last 24 hours: Temp:  [97.9 F (36.6 C)-98.6 F (37 C)] 98 F (36.7 C) (07/19 0448) Pulse Rate:  [58-99] 99 (07/19 0448) Resp:  [12-25] 20 (07/19 0448) BP: (101-168)/(37-96) 137/62 mmHg (07/19 0448) SpO2:  [7 %-100 %] 100 % (07/19 0448) Weight:  [136 lb 7.4 oz (61.9 kg)] 136 lb 7.4 oz (61.9 kg) (07/18 1605) Last BM Date: 05/19/16 General: African American female in NAD Heart:  Regular rate and rhythm; no murmurs Lungs: Respirations even and unlabored, lungs CTA bilaterally Abdomen:  Soft, nontender and nondistended. Normal bowel sounds. Extremities:  Without edema. Neurologic:  Alert and oriented,  grossly normal neurologically. Psych:  Cooperative. Normal mood and affect.  Intake/Output from previous day: 07/18 0701 - 07/19 0700 In: 258.8 [I.V.:208.8; IV Piggyback:50] Out: 250 [Urine:250]  Lab Results:  Recent Labs  05/19/16 1158 05/20/16 0506  WBC 9.1 11.3*  HGB 11.4* 10.0*  HCT 34.5* 29.9*  PLT 132* 122*   BMET  Recent Labs  05/19/16 1158 05/20/16 0506  NA 133* 136  K 3.9 4.2  CL 102 107  CO2 24 24  GLUCOSE 121* 63*  BUN 13 12  CREATININE 0.87 0.92  CALCIUM 9.5 9.1   LFT  Recent Labs  05/19/16 1158  PROT 8.1  ALBUMIN 4.1  AST 21  ALT 12*  ALKPHOS 87  BILITOT 0.2*   PT/INR No results for input(s): LABPROT, INR in the last 72  hours.  Studies/Results: Dg Abd Acute W/chest  05/19/2016  CLINICAL DATA:  Abdominal pain with distention. EXAM: DG ABDOMEN ACUTE W/ 1V CHEST COMPARISON:  Chest x-ray 02/13/2016. FINDINGS: Low lung volumes with stable asymmetric elevation right hemidiaphragm. Basilar atelectasis noted, right greater than left. Prominent skin fold seen over the left lung. Cardiopericardial silhouette is at upper limits of normal for size. Bones are diffusely demineralized. Right-side-up decubitus film shows no evidence of intraperitoneal free air. Markedly dilated colonic loop in the left abdomen with some scattered gaseous small bowel distention. IMPRESSION: 1. Low volume chest x-ray with basilar atelectasis. 2. Marked gaseous distention of colon in the left abdomen. Configuration of the dilated colonic loop suggests sigmoid volvulus. 3. No intraperitoneal free air Electronically Signed   By: Misty Stanley M.D.   On: 05/19/2016 11:49   Dg Abd Portable 2v  05/20/2016  CLINICAL DATA:  Follow-up suspected sigmoid volvulus EXAM: PORTABLE ABDOMEN - 2 VIEW COMPARISON:  Acute abdominal series of May 19, 2016 FINDINGS: There remains a loop of moderately distended gas-filled relatively featureless bowel in the mid abdomen. It has a similar configuration to that seen on the previous study. There is increased colonic stool burden in the ascending and descending portions of the colon. There is some stool in the rectum. There are stable calcifications to the right of the lumbar spine. There surgical clips in the gallbladder fossa. IMPRESSION: Slightly decreased  distension of the suspected dilated sigmoid loop in the mid abdomen. No free extraluminal gas is observed. There is some stool in the rectum. Further evaluation of the bowel with abdominal and pelvic CT scan or with single contrast barium enema may be useful. Electronically Signed   By: David  Martinique M.D.   On: 05/20/2016 06:51   05/19/16-flexible sigmoidoscopy, Dr. Ardis Hughs:         Findings:  The twist of volvulus was located in proximal sigmoid, about 25cm from   anus. The mucosa in this area was normal appearing. There was a large   amount of liquid and solid stool distally. I was able to pass the adult   colonoscope more proximally into the colon. There the lumen was very   dilated, filled with air and small amount of liquid stool. The mucosa in   proximal colon (examined up to about the splenic flexure) was slightly   friable, erythematous appearing. There were certainly no signficant   ischemic changes. The air and liquid stool was suctioned from the colon   with very good results, her abdomen was soft throughout aftwards.       Impression: - Sigmoid volvulus. Successful complete   decompression was achieved with suctioning of air,   liquid stool. The colon had only very mild ishcemic   changes proximal to the volvulus point.      Assessment / Plan:   Impression: 1. Generalized abdominal pain: 2 day history of abdominal pain which was worsening, patient had also been vomiting off and on for the past couple of weeks per family, x-ray showed likely sigmoid volvulus, this is decompressed to be a flexible sigmoidoscopy yesterday, 05/19/16, see above; patient's symptoms improved today 2. Vomiting: No further episodes since time of admission 3. Chronic constipation: Per family members at bedside this is been going on for over 20 years 4. New diagnosis of non-Hodgkin's lymphoma last week: The patient is still undergoing workup for this and has not started any form of treatment  Plan: 1. Continue supportive measures 2. Continue to appreciate surgical recommendations 3. Will discuss above with Dr. Ardis Hughs, we will likely sign off  Thank you for your kind consultation.    LOS: 1 day   [name]  05/20/2016, 9:36 AM  Pager #  [phone]   ________________________________________________________________________  Velora Heckler GI MD note:  I personally examined the patient, reviewed the data and agree with the assessment and plan described above. Sigmoid volvulus, reduced yesterday with flexible sigmoidoscopy. I agree with plans for expectant management. It will probably help if she has easy, regular bowel movements and so I recommend 1-2 doses of miralax on a daily basis, indefinitely.  Please call or page with any further questions or concerns.    Owens Loffler, MD [facility] Gastroenterology Pager [phone]

## 2016-05-20 NOTE — Progress Notes (Signed)
Preliminary results by tech - Left Lower Ext. Venous Duplex Completed. Negative for deep and superficial vein thrombosis in the left leg. Mishawn Didion, BS, RDMS, RVT  

## 2016-05-20 NOTE — Care Management Note (Signed)
Case Management Note  Patient Details  Name: Erika Day MRN: [id number] Date of Birth: 05-25-1930  Subjective/Objective:82 y/o f admitted w/Sigmoid volvulus. From home, has private sitters Mon-Fri.PT cons-await recc.                    Action/Plan:d/c home.   Expected Discharge Date:   (unknown)               Expected Discharge Plan:  [place]  In-House Referral:     Discharge planning Services  CM Consult  Post Acute Care Choice:  Resumption of Svcs/PTA Provider (Private sitters-m-f.) Choice offered to:     DME Arranged:    DME Agency:     HH Arranged:    [place] Agency:     Status of Service:  In process, will continue to follow  If discussed at Long Length of Stay Meetings, dates discussed:    Additional Comments:  Dessa Phi, RN 05/20/2016, 12:03 PM

## 2016-05-20 NOTE — Progress Notes (Signed)
[facility] PROGRESS NOTE    Progress Note  Erika Day  [id number] DOB: 1930-01-21 DOA: 2016-05-24 PCP: Philis Fendt, MD     Brief Narrative:   Erika Day is an 82 y.o. female past medical history of uterine tumor status post resection and multiple other abdominal surgeries, chronic constipation recently diagnosed with lymphoma which has not started treatment presents to the ED complaining of abdominal pain nausea vomiting. Abdominal x-ray and ED suggest his volvulus  Assessment/Plan:   Sigmoid volvulus ([place]): NG tube was place, GI was consulted who recommended a flexible sigmoidoscopy that shows sigmoid volvulus with successful complete decompression, and also social and very mild ischemic changes proximal to the volvulus point. GI recommended to avoid narcotics continue MiraLAX. I've received surgeries assistance continue expectant management.  Chronic constipation: Continue MiraLAX daily.  Newly diagnosed non-Hodgkin lymphoma:  She has not yet started treatment.  DM (diabetes mellitus), type 2, uncontrolled ([place]) Good control she is currently nothing by mouth. Will continue monitor. Continue diet control unless A1c of 5.4. No medications at home.  Hyponatremia due to dehydration: Resolved with IV fluid hydration.  Pressure ulcer: WOC  ChronicLower extremity edema    DVT prophylaxis: lovenox Family Communication:daughter in law Disposition Plan/Barrier to D/C: home in am Code Status:     Code Status Orders        Start     Ordered   05-24-2016 1600  Full code   Continuous     May 24, 2016 1559    Code Status History    Date Active Date Inactive Code Status Order ID Comments User Context   02/13/2016  7:10 AM 02/15/2016  5:33 PM Full Code [id number]  Edwin Dada, MD Inpatient   04/23/2014  4:22 PM 05/08/2014  6:33 PM Full Code [id number]  Kelvin Cellar, MD Inpatient   09/09/2011  6:01 PM 09/10/2011  5:42 PM Full Code [id number]  Bonnielee Haff,  MD Inpatient        IV Access:    Peripheral IV   Procedures and diagnostic studies:   Dg Abd Acute W/chest  May 24, 2016  CLINICAL DATA:  Abdominal pain with distention. EXAM: DG ABDOMEN ACUTE W/ 1V CHEST COMPARISON:  Chest x-ray 02/13/2016. FINDINGS: Low lung volumes with stable asymmetric elevation right hemidiaphragm. Basilar atelectasis noted, right greater than left. Prominent skin fold seen over the left lung. Cardiopericardial silhouette is at upper limits of normal for size. Bones are diffusely demineralized. Right-side-up decubitus film shows no evidence of intraperitoneal free air. Markedly dilated colonic loop in the left abdomen with some scattered gaseous small bowel distention. IMPRESSION: 1. Low volume chest x-ray with basilar atelectasis. 2. Marked gaseous distention of colon in the left abdomen. Configuration of the dilated colonic loop suggests sigmoid volvulus. 3. No intraperitoneal free air Electronically Signed   By: Misty Stanley M.D.   On: May 24, 2016 11:49   Dg Abd Portable 2v  05/20/2016  CLINICAL DATA:  Follow-up suspected sigmoid volvulus EXAM: PORTABLE ABDOMEN - 2 VIEW COMPARISON:  Acute abdominal series of 2016/05/24 FINDINGS: There remains a loop of moderately distended gas-filled relatively featureless bowel in the mid abdomen. It has a similar configuration to that seen on the previous study. There is increased colonic stool burden in the ascending and descending portions of the colon. There is some stool in the rectum. There are stable calcifications to the right of the lumbar spine. There surgical clips in the gallbladder fossa. IMPRESSION: Slightly decreased distension of the suspected dilated sigmoid  loop in the mid abdomen. No free extraluminal gas is observed. There is some stool in the rectum. Further evaluation of the bowel with abdominal and pelvic CT scan or with single contrast barium enema may be useful. Electronically Signed   By: David  Martinique M.D.    On: 05/20/2016 06:51     Medical Consultants:    None.  Anti-Infectives:   none  Subjective:    Erika Day she relates no new complaints abdominal pain resolved.  Objective:    Filed Vitals:   05/19/16 1559 05/19/16 1605 05/19/16 2241 05/20/16 0448  BP: 144/63  168/96 137/62  Pulse: 63  79 99  Temp: 97.9 F (36.6 C)  98.6 F (37 C) 98 F (36.7 C)  TempSrc: Oral  Oral Oral  Resp: 22  20 20   Height:  5\' 2"  (1.575 m)    Weight:  61.9 kg (136 lb 7.4 oz)    SpO2: 100%  100% 100%    Intake/Output Summary (Last 24 hours) at 05/20/16 1246 Last data filed at 05/20/16 1058  Gross per 24 hour  Intake 258.75 ml  Output    700 ml  Net -441.25 ml   Filed Weights   05/19/16 1605  Weight: 61.9 kg (136 lb 7.4 oz)    Exam: General exam: In no acute distress.Cachectic appearing. Respiratory system: Good air movement and clear to auscultation. Cardiovascular system: S1 & S2 heard, RRR.  Gastrointestinal system: Abdomen is nondistended, soft and nontender.  Central nervous system: Alert and oriented. No focal neurological deficits. Extremities: No pedal edema. Skin: No rashes, lesions or ulcers Psychiatry: Judgement and insight appear normal. Mood & affect appropriate.    Data Reviewed:    Labs: Basic Metabolic Panel:  Recent Labs Lab 05/15/16 1233 05/19/16 1158 05/20/16 0506  NA 139 133* 136  K 4.4 3.9 4.2  CL  --  102 107  CO2 24 24 24   GLUCOSE 127 121* 63*  BUN 11.6 13 12   CREATININE 1.0 0.87 0.92  CALCIUM 9.5 9.5 9.1   GFR Estimated Creatinine Clearance: 39.4 mL/min (by C-G formula based on Cr of 0.92). Liver Function Tests:  Recent Labs Lab 05/15/16 1233 05/19/16 1158  AST 13 21  ALT <9 12*  ALKPHOS 83 87  BILITOT 0.30 0.2*  PROT 7.6 8.1  ALBUMIN 3.4* 4.1    Recent Labs Lab 05/19/16 1158  LIPASE 29   No results for input(s): AMMONIA in the last 168 hours. Coagulation profile No results for input(s): INR, PROTIME in the last 168  hours.  CBC:  Recent Labs Lab 05/15/16 1233 05/19/16 1158 05/20/16 0506  WBC 8.3 9.1 11.3*  NEUTROABS 1.6 2.6  --   HGB 10.4* 11.4* 10.0*  HCT 31.7* 34.5* 29.9*  MCV 85.1 85.0 84.9  PLT 127* 132* 122*   Cardiac Enzymes: No results for input(s): CKTOTAL, CKMB, CKMBINDEX, TROPONINI in the last 168 hours. BNP (last 3 results) No results for input(s): PROBNP in the last 8760 hours. CBG: No results for input(s): GLUCAP in the last 168 hours. D-Dimer: No results for input(s): DDIMER in the last 72 hours. Hgb A1c: No results for input(s): HGBA1C in the last 72 hours. Lipid Profile: No results for input(s): CHOL, HDL, LDLCALC, TRIG, CHOLHDL, LDLDIRECT in the last 72 hours. Thyroid function studies: No results for input(s): TSH, T4TOTAL, T3FREE, THYROIDAB in the last 72 hours.  Invalid input(s): FREET3 Anemia work up: No results for input(s): VITAMINB12, FOLATE, FERRITIN, TIBC, IRON, RETICCTPCT in the last  72 hours. Sepsis Labs:  Recent Labs Lab 05/15/16 1233 05/19/16 1158 05/20/16 0506  WBC 8.3 9.1 11.3*   Microbiology Recent Results (from the past 240 hour(s))  TECHNOLOGIST REVIEW     Status: None   Collection Time: 05/15/16 12:33 PM  Result Value Ref Range Status   Technologist Review   Corrected    Variant lymphs present(some with folded nucleus, some with frayed cytoplasm, some with nucleoli), Occ smudge cell, Mod ovalos, few burr cells    Comment: Result amended from (Variant lymphs present(some with folded nucleus, some with frayed cytoplasm), Occ smudge cell, Mod ovalos, few burr cells) (05/15/2016 1:16 PM) to (Variant lymphs present(some with folded nucleus, some with frayed cytoplasm, some with  nucleoli), Occ smudge cell, Mod ovalos, few burr cells).      Medications:   . famotidine (PEPCID) IV  20 mg Intravenous Q12H  . lidocaine  1 application Other Once  . sodium chloride flush  3 mL Intravenous Q12H   Continuous Infusions: . sodium chloride 75 mL/hr  at 05/20/16 0828    Time spent: 15 min   LOS: 1 day   Charlynne Cousins  [facility] Pager [phone]  *Please refer to [email or web address], password [email or web address] to get updated schedule on who will round on this patient, as hospitalists switch teams weekly. If 7PM-7AM, please contact night-coverage at [email or web address], password [email or web address] for any overnight needs.  05/20/2016, 12:46 PM

## 2016-05-20 NOTE — Progress Notes (Signed)
Patient ID: [name], female   DOB: 1930/07/27, 82 y.o.   MRN: [id number] 1 Day Post-Op  Subjective: Feels well this morning. Abdominal pain and bloating completely relieved. She has had a couple of loose bowel movements since her colonoscopy.  Objective: Vital signs in last 24 hours: Temp:  [97.9 F (36.6 C)-98.6 F (37 C)] 98 F (36.7 C) (07/19 0448) Pulse Rate:  [58-99] 99 (07/19 0448) Resp:  [12-25] 20 (07/19 0448) BP: (101-168)/(37-96) 137/62 mmHg (07/19 0448) SpO2:  [7 %-100 %] 100 % (07/19 0448) Weight:  [61.9 kg (136 lb 7.4 oz)] 61.9 kg (136 lb 7.4 oz) (07/18 1605) Last BM Date: 05/19/16  Intake/Output from previous day: 07/18 0701 - 07/19 0700 In: 258.8 [I.V.:208.8; IV Piggyback:50] Out: 250 [Urine:250] Intake/Output this shift:    General appearance: alert, cooperative and no distress GI: normal findings: soft, non-tender and without distention  Lab Results:   Recent Labs  05/19/16 1158 05/20/16 0506  WBC 9.1 11.3*  HGB 11.4* 10.0*  HCT 34.5* 29.9*  PLT 132* 122*   BMET  Recent Labs  05/19/16 1158 05/20/16 0506  NA 133* 136  K 3.9 4.2  CL 102 107  CO2 24 24  GLUCOSE 121* 63*  BUN 13 12  CREATININE 0.87 0.92  CALCIUM 9.5 9.1     Studies/Results: Dg Abd Acute W/chest  05/19/2016  CLINICAL DATA:  Abdominal pain with distention. EXAM: DG ABDOMEN ACUTE W/ 1V CHEST COMPARISON:  Chest x-ray 02/13/2016. FINDINGS: Low lung volumes with stable asymmetric elevation right hemidiaphragm. Basilar atelectasis noted, right greater than left. Prominent skin fold seen over the left lung. Cardiopericardial silhouette is at upper limits of normal for size. Bones are diffusely demineralized. Right-side-up decubitus film shows no evidence of intraperitoneal free air. Markedly dilated colonic loop in the left abdomen with some scattered gaseous small bowel distention. IMPRESSION: 1. Low volume chest x-ray with basilar atelectasis. 2. Marked gaseous distention of colon  in the left abdomen. Configuration of the dilated colonic loop suggests sigmoid volvulus. 3. No intraperitoneal free air Electronically Signed   By: Misty Stanley M.D.   On: 05/19/2016 11:49   Dg Abd Portable 2v  05/20/2016  CLINICAL DATA:  Follow-up suspected sigmoid volvulus EXAM: PORTABLE ABDOMEN - 2 VIEW COMPARISON:  Acute abdominal series of May 19, 2016 FINDINGS: There remains a loop of moderately distended gas-filled relatively featureless bowel in the mid abdomen. It has a similar configuration to that seen on the previous study. There is increased colonic stool burden in the ascending and descending portions of the colon. There is some stool in the rectum. There are stable calcifications to the right of the lumbar spine. There surgical clips in the gallbladder fossa. IMPRESSION: Slightly decreased distension of the suspected dilated sigmoid loop in the mid abdomen. No free extraluminal gas is observed. There is some stool in the rectum. Further evaluation of the bowel with abdominal and pelvic CT scan or with single contrast barium enema may be useful. Electronically Signed   By: David  Martinique M.D.   On: 05/20/2016 06:51    Anti-infectives: Anti-infectives    None      Assessment/Plan: s/p Procedure(s): FLEXIBLE SIGMOIDOSCOPY Initial episode of sigmoid volvulus, resolved following flexible sigmoidoscopy. Sigmoidoscopy report reviewed.  No evidence of ischemia or complicating factors. I again discussed subsequent management with the patient i.e. Surgical  Resection  Versus expectant management. I believe she would be a significant operative risk due to multiple comorbidities. She has discussed this with  her family and they are all in agreement that she does not want any surgical intervention unless absolutely necessary. I think at this point this is reasonable and I would reserve surgery if necessary for frequent recurrence.  We will sign off. Please call if needed.   LOS: 1 day     Varshini Arrants T 05/20/2016

## 2016-05-21 MED ORDER — POLYETHYLENE GLYCOL 3350 17 G PO PACK
17.0000 g | PACK | Freq: Two times a day (BID) | ORAL | Status: DC
  Administered 2016-05-21 – 2016-05-22 (×3): 17 g via ORAL
  Filled 2016-05-21 (×3): qty 1

## 2016-05-21 NOTE — Progress Notes (Signed)
PT Cancellation Note  Patient Details Name: Erika Day MRN: [id number] DOB: April 09, 1930   Cancelled Treatment:    Reason Eval/Treat Not Completed: Attempted PT eval-pt politely declined due to fatigue. Pt requested PT check back on tomorrow.    Weston Anna, MPT Pager: [phone]

## 2016-05-21 NOTE — Progress Notes (Signed)
[facility] PROGRESS NOTE    Progress Note  Erika Day  [id number] DOB: Oct 24, 1930 DOA: 05/19/2016 PCP: Philis Fendt, MD     Brief Narrative:   Erika Day is an 82 y.o. female past medical history of uterine tumor status post resection and multiple other abdominal surgeries, chronic constipation recently diagnosed with lymphoma which has not started treatment presents to the ED complaining of abdominal pain nausea vomiting. Abdominal x-ray and ED suggest his volvulus  Assessment/Plan:   Sigmoid volvulus ([place]): Tolerating diet appreciate surgeries in GIs assistance, advanced diet to full liquid diet if tolerated can advance regular home in the morning.  Chronic constipation: Increase MiraLAX by mouth twice a day.  Newly diagnosed non-Hodgkin lymphoma:  She has not yet started treatment.  DM (diabetes mellitus), type 2, uncontrolled ([place]) Good control she is currently nothing by mouth. Will continue monitor. Continue diet control unless A1c of 5.4. No medications at home.  Hyponatremia due to dehydration: Resolved with IV fluid hydration.  Pressure ulcer: WOC  Chronic Lower extremity edema    DVT prophylaxis: lovenox Family Communication:daughter in law Disposition Plan/Barrier to D/C: home in am Code Status:     Code Status Orders        Start     Ordered   05/19/16 1600  Full code   Continuous     05/19/16 1559    Code Status History    Date Active Date Inactive Code Status Order ID Comments User Context   02/13/2016  7:10 AM 02/15/2016  5:33 PM Full Code [id number]  Edwin Dada, MD Inpatient   04/23/2014  4:22 PM 05/08/2014  6:33 PM Full Code [id number]  Kelvin Cellar, MD Inpatient   09/09/2011  6:01 PM 09/10/2011  5:42 PM Full Code [id number]  Bonnielee Haff, MD Inpatient        IV Access:    Peripheral IV   Procedures and diagnostic studies:   Dg Abd Portable 2v  05/20/2016  CLINICAL DATA:  Follow-up suspected sigmoid  volvulus EXAM: PORTABLE ABDOMEN - 2 VIEW COMPARISON:  Acute abdominal series of May 19, 2016 FINDINGS: There remains a loop of moderately distended gas-filled relatively featureless bowel in the mid abdomen. It has a similar configuration to that seen on the previous study. There is increased colonic stool burden in the ascending and descending portions of the colon. There is some stool in the rectum. There are stable calcifications to the right of the lumbar spine. There surgical clips in the gallbladder fossa. IMPRESSION: Slightly decreased distension of the suspected dilated sigmoid loop in the mid abdomen. No free extraluminal gas is observed. There is some stool in the rectum. Further evaluation of the bowel with abdominal and pelvic CT scan or with single contrast barium enema may be useful. Electronically Signed   By: David  Martinique M.D.   On: 05/20/2016 06:51     Medical Consultants:    None.  Anti-Infectives:   none  Subjective:    Erika Day no new complaints tolerating diet.  Objective:    Filed Vitals:   05/20/16 0448 05/20/16 1335 05/20/16 2117 05/21/16 0605  BP: 137/62 135/66 177/83 149/61  Pulse: 99 54 65 56  Temp: 98 F (36.7 C) 98.1 F (36.7 C) 98.3 F (36.8 C) 98.1 F (36.7 C)  TempSrc: Oral Oral Oral Oral  Resp: 20 20 20 20   Height:      Weight:      SpO2: 100% 100% 100% 100%  Intake/Output Summary (Last 24 hours) at 05/21/16 1420 Last data filed at 05/21/16 1044  Gross per 24 hour  Intake   2210 ml  Output      0 ml  Net   2210 ml   Filed Weights   05/19/16 1605  Weight: 61.9 kg (136 lb 7.4 oz)    Exam: General exam: In no acute distress.Cachectic appearing. Respiratory system: Good air movement and clear to auscultation. Cardiovascular system: S1 & S2 heard, RRR.  Gastrointestinal system: Abdomen is nondistended, soft and nontender.  Central nervous system: Alert and oriented. No focal neurological deficits. Extremities: No pedal  edema. Skin: No rashes, lesions or ulcers   Data Reviewed:    Labs: Basic Metabolic Panel:  Recent Labs Lab 05/15/16 1233 05/19/16 1158 05/20/16 0506  NA 139 133* 136  K 4.4 3.9 4.2  CL  --  102 107  CO2 24 24 24   GLUCOSE 127 121* 63*  BUN 11.6 13 12   CREATININE 1.0 0.87 0.92  CALCIUM 9.5 9.5 9.1   GFR Estimated Creatinine Clearance: 39.4 mL/min (by C-G formula based on Cr of 0.92). Liver Function Tests:  Recent Labs Lab 05/15/16 1233 05/19/16 1158  AST 13 21  ALT <9 12*  ALKPHOS 83 87  BILITOT 0.30 0.2*  PROT 7.6 8.1  ALBUMIN 3.4* 4.1    Recent Labs Lab 05/19/16 1158  LIPASE 29   No results for input(s): AMMONIA in the last 168 hours. Coagulation profile No results for input(s): INR, PROTIME in the last 168 hours.  CBC:  Recent Labs Lab 05/15/16 1233 05/19/16 1158 05/20/16 0506  WBC 8.3 9.1 11.3*  NEUTROABS 1.6 2.6  --   HGB 10.4* 11.4* 10.0*  HCT 31.7* 34.5* 29.9*  MCV 85.1 85.0 84.9  PLT 127* 132* 122*   Cardiac Enzymes: No results for input(s): CKTOTAL, CKMB, CKMBINDEX, TROPONINI in the last 168 hours. BNP (last 3 results) No results for input(s): PROBNP in the last 8760 hours. CBG: No results for input(s): GLUCAP in the last 168 hours. D-Dimer: No results for input(s): DDIMER in the last 72 hours. Hgb A1c: No results for input(s): HGBA1C in the last 72 hours. Lipid Profile: No results for input(s): CHOL, HDL, LDLCALC, TRIG, CHOLHDL, LDLDIRECT in the last 72 hours. Thyroid function studies: No results for input(s): TSH, T4TOTAL, T3FREE, THYROIDAB in the last 72 hours.  Invalid input(s): FREET3 Anemia work up: No results for input(s): VITAMINB12, FOLATE, FERRITIN, TIBC, IRON, RETICCTPCT in the last 72 hours. Sepsis Labs:  Recent Labs Lab 05/15/16 1233 05/19/16 1158 05/20/16 0506  WBC 8.3 9.1 11.3*   Microbiology Recent Results (from the past 240 hour(s))  TECHNOLOGIST REVIEW     Status: None   Collection Time: 05/15/16  12:33 PM  Result Value Ref Range Status   Technologist Review   Corrected    Variant lymphs present(some with folded nucleus, some with frayed cytoplasm, some with nucleoli), Occ smudge cell, Mod ovalos, few burr cells    Comment: Result amended from (Variant lymphs present(some with folded nucleus, some with frayed cytoplasm), Occ smudge cell, Mod ovalos, few burr cells) (05/15/2016 1:16 PM) to (Variant lymphs present(some with folded nucleus, some with frayed cytoplasm, some with  nucleoli), Occ smudge cell, Mod ovalos, few burr cells).      Medications:   . famotidine (PEPCID) IV  20 mg Intravenous Q12H  . lidocaine  1 application Other Once  . sodium chloride flush  3 mL Intravenous Q12H   Continuous Infusions: .  sodium chloride 75 mL/hr at 05/21/16 0052    Time spent: 15 min   LOS: 2 days   Charlynne Cousins  [facility] Pager [phone]  *Please refer to [email or web address], password [email or web address] to get updated schedule on who will round on this patient, as hospitalists switch teams weekly. If 7PM-7AM, please contact night-coverage at [email or web address], password [email or web address] for any overnight needs.  05/21/2016, 2:20 PM

## 2016-05-22 DIAGNOSIS — L8990 Pressure ulcer of unspecified site, unspecified stage: Secondary | ICD-10-CM

## 2016-05-22 LAB — FLOW CYTOMETRY

## 2016-05-22 MED ORDER — POLYETHYLENE GLYCOL 3350 17 G PO PACK: 17.0000 g | PACK | Freq: Two times a day (BID) | ORAL | Status: DC

## 2016-05-22 NOTE — Discharge Summary (Signed)
Physician Discharge Summary  Erika Day HAL:[id number] DOB: 08-10-1930 DOA: 05/19/2016  PCP: Philis Fendt, MD  Admit date: 05/19/2016 Discharge date: 05/22/2016  Admitted From: Home Disposition:  Home  Recommendations for Outpatient Follow-up:  1. Follow up with PCP in 1-2 weeks 2. We will home with physical therapy.  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 They recommended a flexed sigmoidoscopy that showed a volvulus which was successfully completely decompressed. Biopsy was done that was compatible with ischemic changes. GI recommended to continue MiraLAX at home.  Chronic constipation: She will continue MiraLAX twice a day which makes her have at least 1-2 bowel movements a day have explained to the importance of trying to keep her stool soft.   Newly diagnose Hodgkin's lymphoma: Follow-up with oncology as an outpatient.  DM (diabetes mellitus), type 2, uncontrolled ([place]) Continue diet control diet control  DM management.  Hyponatremia Daily due to dehydration resolved with IV fluid hydration.  Pressure Ulcer: Wound care was consulted.  Protein-calorie malnutrition, severe ([place])   Discharge Instructions  Discharge Instructions    Diet - low sodium heart healthy    Complete by:  As directed      Increase activity slowly    Complete by:  As directed             Medication List    STOP  taking these medications        cetirizine 10 MG tablet  Commonly known as:  ZYRTEC      TAKE these medications        aspirin EC 81 MG tablet  Take 81 mg by mouth daily.     furosemide 20 MG tablet  Commonly known as:  LASIX  Take 20 mg by mouth daily as needed for fluid.     lisinopril 10 MG tablet  Commonly known as:  PRINIVIL,ZESTRIL  Take 10 mg by mouth daily. Reported on 01/31/2016     pantoprazole 40 MG tablet  Commonly known as:  PROTONIX  Take 40 mg by mouth daily.     polyethylene glycol packet  Commonly known as:  MIRALAX / GLYCOLAX  Take 17 g by mouth 2 (two) times daily.     prednisoLONE acetate 1 % ophthalmic suspension  Commonly known as:  PRED FORTE  Place 1-2 drops into both eyes See admin instructions. 2 drops in right eye and 1 drop in the left eye twice daily     sertraline 25 MG tablet  Commonly known as:  ZOLOFT  Take 25 mg by mouth daily. Reported on 05/19/2016           Follow-up Information    Follow up with [facility].   Why:  HHPT   Contact information:   [place] [id number] [phone]      Allergies  Allergen Reactions  . Morphine And Related Other (See Comments)    Blood sugar dropped one-time    Consultations:  GI Dr. Ardis Hughs   Procedures/Studies:  [place] Extrem Up Right Ltd  05/08/2016  CLINICAL DATA:  Patient has lumps in her axilla, more notable on the right. No reported breast lump. EXAM: 2D DIGITAL DIAGNOSTIC BILATERAL MAMMOGRAM WITH CAD AND ADJUNCT TOMO ULTRASOUND RIGHT AXILLA COMPARISON:  None ACR Breast Density Category b: There are scattered areas of fibroglandular density. FINDINGS: There are no discrete breast masses, areas of architectural distortion, areas of significant asymmetry or suspicious calcifications. Mammographic images were processed with CAD. On physical exam, patient has superficial palpable lumps in her right axilla. Targeted ultrasound is performed, showing  multiple enlarged lymph nodes with thickened cortices. Largest short axis node measures 1.4 cm. Cortices range between 4 and 6 mm. IMPRESSION: 1. No evidence of breast malignancy. 2. Enlarged right axillary lymph nodes with thickened cortices. This suggests a systemic infectious/inflammatory or neoplastic process. Recommend biopsy of 1 of these lymph nodes. RECOMMENDATION: Patient will be scheduled to undergo ultrasound-guided core needle biopsy of 1 of the right axillary lymph nodes. I have discussed the findings and recommendations with the patient. Results were also provided in writing at the conclusion of the visit. If applicable, a reminder letter will be sent to the patient regarding the next appointment. BI-RADS CATEGORY  1: Negative. Electronically Signed   By: Lajean Manes M.D.   On: 05/08/2016 11:11   Dg Abd Acute W/chest  05/19/2016  CLINICAL DATA:  Abdominal pain with distention. EXAM: DG ABDOMEN ACUTE W/ 1V CHEST COMPARISON:  Chest x-ray 02/13/2016. FINDINGS: Low lung volumes with stable asymmetric elevation right hemidiaphragm. Basilar atelectasis noted, right greater than left. Prominent skin fold seen over the left lung. Cardiopericardial silhouette is at upper limits of normal for size. Bones are diffusely demineralized. Right-side-up decubitus film shows no evidence of intraperitoneal free air. Markedly dilated colonic loop in the left abdomen with some scattered gaseous small bowel distention. IMPRESSION: 1. Low volume chest x-ray with basilar atelectasis. 2. Marked gaseous distention of colon in the left abdomen. Configuration of the dilated colonic loop suggests sigmoid volvulus. 3. No intraperitoneal free air Electronically Signed   By: Misty Stanley M.D.   On: 05/19/2016 11:49   Dg Abd Portable 2v  05/20/2016  CLINICAL DATA:  Follow-up suspected sigmoid volvulus EXAM: PORTABLE ABDOMEN - 2 VIEW COMPARISON:  Acute abdominal series of May 19, 2016 FINDINGS: There remains a loop of  moderately distended gas-filled relatively featureless bowel in the mid abdomen. It has a similar configuration to that seen on the previous study. There is increased colonic stool burden in the ascending and descending portions of the colon. There is some stool in the rectum. There are stable calcifications to the right of the lumbar spine. There surgical clips in the gallbladder fossa. IMPRESSION: Slightly decreased distension of the suspected dilated sigmoid loop in the mid abdomen. No free extraluminal gas is observed. There is some stool in the rectum. Further evaluation of the bowel with abdominal and pelvic CT scan or with single contrast barium enema may be useful. Electronically Signed   By: David  Martinique M.D.   On: 05/20/2016 06:51   Mm Diag Breast Tomo Bilateral  05/08/2016  CLINICAL DATA:  Patient has lumps in her axilla, more notable on the right. No reported breast lump. EXAM: 2D DIGITAL DIAGNOSTIC BILATERAL MAMMOGRAM WITH CAD AND ADJUNCT TOMO ULTRASOUND RIGHT AXILLA COMPARISON:  None ACR Breast Density Category b: There are scattered areas of fibroglandular density. FINDINGS: There are no discrete breast masses, areas of architectural distortion, areas of significant asymmetry or suspicious calcifications.  Mammographic images were processed with CAD. On physical exam, patient has superficial palpable lumps in her right axilla. Targeted ultrasound is performed, showing multiple enlarged lymph nodes with thickened cortices. Largest short axis node measures 1.4 cm. Cortices range between 4 and 6 mm. IMPRESSION: 1. No evidence of breast malignancy. 2. Enlarged right axillary lymph nodes with thickened cortices. This suggests a systemic infectious/inflammatory or neoplastic process. Recommend biopsy of 1 of these lymph nodes. RECOMMENDATION: Patient will be scheduled to undergo ultrasound-guided core needle biopsy of 1 of the right axillary lymph nodes. I have discussed the findings and recommendations  with the patient. Results were also provided in writing at the conclusion of the visit. If applicable, a reminder letter will be sent to the patient regarding the next appointment. BI-RADS CATEGORY  1: Negative. Electronically Signed   By: Lajean Manes M.D.   On: 05/08/2016 11:11   [place] Rt Breast Bx W Loc Dev 1st Lesion Img Bx Spec US Guide  05/14/2016  ADDENDUM REPORT: 05/14/2016 14:16 ADDENDUM: PATHOLOGY ADDENDUM: Pathology right axillary lymph node:  non-Hodgkin B-cell lymphoma Pathology concordance with imaging findings: Yes Recommendation: Consultation regarding treatment plan At the request of the patient, I spoke with her and her daughter by telephone on 05/14/2016 at 14:15. She reports doing well after the biopsy . The patient is scheduled see Dr. Elson Areas at the [facility] at 11:15 on 05/15/2016. Electronically Signed   By: Nolon Nations M.D.   On: 05/14/2016 14:16  05/14/2016  CLINICAL DATA:  Patient returns for ultrasound-guided core biopsy of enlarged right axillary lymph node. EXAM: ULTRASOUND GUIDED CORE NEEDLE BIOPSY OF A RIGHT AXILLARY NODE COMPARISON:  Previous exam(s). FINDINGS: Prior to biopsy, ultrasound is performed and confirms presence of enlarged right axillary lymph nodes. Additionally, evaluation of the left axilla is performed and demonstrates similar appearance of enlarged axillary lymph nodes but slightly smaller compared to those on the right. Bilateral axillary lymph nodes are palpable. I met with the patient and we discussed the procedure of ultrasound-guided biopsy, including benefits and alternatives. We discussed the high likelihood of a successful procedure. We discussed the risks of the procedure, including infection, bleeding, tissue injury, clip migration, and inadequate sampling. Informed written consent was given. The usual time-out protocol was performed immediately prior to the procedure. Using sterile technique and 1% Lidocaine as local anesthetic, under  direct ultrasound visualization, a 14 gauge spring-loaded device was used to perform biopsy of the lower most enlarged right axillary lymph node using a lateral approach. Specimens are submitted in formalin and in saline. At the conclusion of the procedure a coil shaped tissue marker clip was deployed into the biopsy cavity. IMPRESSION: Ultrasound guided biopsy of right axillary lymph node. No apparent complications. Electronically Signed: By: Nolon Nations M.D. On: 05/12/2016 15:21      Subjective:   Discharge Exam: Filed Vitals:   05/21/16 2228 05/22/16 0459  BP: 171/78 140/55  Pulse: 61 52  Temp:  98.4 F (36.9 C)  Resp:  16   Filed Vitals:   05/21/16 1509 05/21/16 2139 05/21/16 2228 05/22/16 0459  BP: 160/67 180/71 171/78 140/55  Pulse: 59 57 61 52  Temp: 97.7 F (36.5 C) 99 F (37.2 C)  98.4 F (36.9 C)  TempSrc: Oral Oral  Oral  Resp: 18 18  16   Height:      Weight:      SpO2: 100% 100%  100%    General: Pt is alert, awake, not in acute  distress Cardiovascular: RRR, S1/S2 +, no rubs, no gallops Respiratory: CTA bilaterally, no wheezing, no rhonchi Abdominal: Soft, NT, ND, bowel sounds + Extremities: no edema, no cyanosis    The results of significant diagnostics from this hospitalization (including imaging, microbiology, ancillary and laboratory) are listed below for reference.     Microbiology: Recent Results (from the past 240 hour(s))  TECHNOLOGIST REVIEW     Status: None   Collection Time: 05/15/16 12:33 PM  Result Value Ref Range Status   Technologist Review   Corrected    Variant lymphs present(some with folded nucleus, some with frayed cytoplasm, some with nucleoli), Occ smudge cell, Mod ovalos, few burr cells    Comment: Result amended from (Variant lymphs present(some with folded nucleus, some with frayed cytoplasm), Occ smudge cell, Mod ovalos, few burr cells) (05/15/2016 1:16 PM) to (Variant lymphs present(some with folded nucleus, some with frayed  cytoplasm, some with  nucleoli), Occ smudge cell, Mod ovalos, few burr cells).      Labs: BNP (last 3 results)  Recent Labs  02/13/16 0350  BNP 250.5*   Basic Metabolic Panel:  Recent Labs Lab 05/15/16 1233 05/19/16 1158 05/20/16 0506  NA 139 133* 136  K 4.4 3.9 4.2  CL  --  102 107  CO2 24 24 24   GLUCOSE 127 121* 63*  BUN 11.6 13 12   CREATININE 1.0 0.87 0.92  CALCIUM 9.5 9.5 9.1   Liver Function Tests:  Recent Labs Lab 05/15/16 1233 05/19/16 1158  AST 13 21  ALT <9 12*  ALKPHOS 83 87  BILITOT 0.30 0.2*  PROT 7.6 8.1  ALBUMIN 3.4* 4.1    Recent Labs Lab 05/19/16 1158  LIPASE 29   No results for input(s): AMMONIA in the last 168 hours. CBC:  Recent Labs Lab 05/15/16 1233 05/19/16 1158 05/20/16 0506  WBC 8.3 9.1 11.3*  NEUTROABS 1.6 2.6  --   HGB 10.4* 11.4* 10.0*  HCT 31.7* 34.5* 29.9*  MCV 85.1 85.0 84.9  PLT 127* 132* 122*   Cardiac Enzymes: No results for input(s): CKTOTAL, CKMB, CKMBINDEX, TROPONINI in the last 168 hours. BNP: Invalid input(s): POCBNP CBG: No results for input(s): GLUCAP in the last 168 hours. D-Dimer No results for input(s): DDIMER in the last 72 hours. Hgb A1c No results for input(s): HGBA1C in the last 72 hours. Lipid Profile No results for input(s): CHOL, HDL, LDLCALC, TRIG, CHOLHDL, LDLDIRECT in the last 72 hours. Thyroid function studies No results for input(s): TSH, T4TOTAL, T3FREE, THYROIDAB in the last 72 hours.  Invalid input(s): FREET3 Anemia work up No results for input(s): VITAMINB12, FOLATE, FERRITIN, TIBC, IRON, RETICCTPCT in the last 72 hours. Urinalysis    Component Value Date/Time   COLORURINE YELLOW 05/20/2016 0000   APPEARANCEUR CLEAR 05/20/2016 0000   LABSPEC 1.010 05/20/2016 0000   PHURINE 6.0 05/20/2016 0000   GLUCOSEU NEGATIVE 05/20/2016 0000   HGBUR NEGATIVE 05/20/2016 0000   BILIRUBINUR NEGATIVE 05/20/2016 0000   KETONESUR NEGATIVE 05/20/2016 0000   PROTEINUR NEGATIVE 05/20/2016  0000   UROBILINOGEN 0.2 05/17/2015 2307   NITRITE NEGATIVE 05/20/2016 0000   LEUKOCYTESUR SMALL* 05/20/2016 0000   Sepsis Labs Invalid input(s): PROCALCITONIN,  WBC,  LACTICIDVEN Microbiology Recent Results (from the past 240 hour(s))  TECHNOLOGIST REVIEW     Status: None   Collection Time: 05/15/16 12:33 PM  Result Value Ref Range Status   Technologist Review   Corrected    Variant lymphs present(some with folded nucleus, some with frayed cytoplasm, some with nucleoli),  Occ smudge cell, Mod ovalos, few burr cells    Comment: Result amended from (Variant lymphs present(some with folded nucleus, some with frayed cytoplasm), Occ smudge cell, Mod ovalos, few burr cells) (05/15/2016 1:16 PM) to (Variant lymphs present(some with folded nucleus, some with frayed cytoplasm, some with  nucleoli), Occ smudge cell, Mod ovalos, few burr cells).      Time coordinating discharge: Over 30 minutes  SIGNED:   Charlynne Cousins, MD  [facility] 05/22/2016, 10:44 AM Pager   If 7PM-7AM, please contact night-coverage [email or web address] Password [email or web address]

## 2016-05-22 NOTE — Progress Notes (Signed)
Chaplain responding to spiritual care consult re: advance directives.    Introduced spiritual care as resource and provided brief education around AD.  Pt was involved in care at moment and requested chaplain follow up at later time.  Will continue to follow for completion of AD.     [name], Erika Day

## 2016-05-22 NOTE — Progress Notes (Signed)
Pt. Had 6 beat run of Vtach while asleep. BP 140/55 HR 52. C/o of no symptoms. NP made aware. Will continue to monitor closely

## 2016-05-22 NOTE — Evaluation (Signed)
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 Pt currently with functional limitations due to the deficits listed below (see PT Problem List).  Pt will benefit from skilled PT to increase their independence and safety with mobility to allow discharge to the venue listed below.  Pt reports living with spouse and has an aide 6 hrs/day.  Pt agreeable to HHPT upon d/c.     Follow Up Recommendations Home health PT;Supervision for mobility/OOB    Equipment Recommendations  None recommended by PT    Recommendations for Other Services       Precautions / Restrictions Precautions Precautions: Fall Restrictions Weight Bearing Restrictions: No      Mobility  Bed Mobility Overal bed mobility: Needs Assistance Bed Mobility: Supine to Sit     Supine to sit: Supervision;HOB elevated        Transfers Overall transfer level: Needs assistance Equipment used: Rolling walker (2 wheeled) Transfers: Sit to/from [facility] Sit to Stand: Min guard Stand pivot transfers: Min guard       General transfer comment: min/guard for safety, verbal cues for hand placememt  Ambulation/Gait Ambulation/Gait assistance: Min guard Ambulation Distance (Feet): 20 Feet (x2) Assistive device: Rolling walker (2 wheeled) Gait Pattern/deviations: Step-through pattern;Decreased stride length;Trunk flexed Gait velocity: decr   General Gait Details: verbal cues for posture and RW positioning, pt reports back pain limits full  upright posture, required one seated rest break  Stairs            Wheelchair Mobility    Modified Rankin (Stroke Patients Only)       Balance                                             Pertinent Vitals/Pain Pain Assessment: No/denies pain    Home Living Family/patient expects to be discharged to:: Private residence Living Arrangements: Spouse/significant other;Children Available Help at Discharge: Family;Personal care attendant;Available 24 hours/day (aide 6 hrs/day) Type of Home: House Home Access: Ramped entrance     Home Layout: One level Home Equipment: Bedside commode;Wheelchair - [employer] - 2 wheels;Shower seat      Prior Function Level of Independence: Independent with assistive device(s)         Comments: states aide assists with IADLs     Hand Dominance        Extremity/Trunk Assessment               Lower Extremity Assessment: Generalized weakness      Cervical / Trunk Assessment: Kyphotic  Communication   Communication: No difficulties  Cognition Arousal/Alertness: Awake/alert Behavior During Therapy: WFL for tasks assessed/performed Overall Cognitive Status: Within Functional Limits for tasks assessed                      General Comments      Exercises        Assessment/Plan    PT Assessment Patient needs continued PT services  PT Diagnosis Difficulty walking;Generalized weakness  PT Problem List Decreased strength;Decreased activity tolerance;Decreased mobility  PT Treatment Interventions DME instruction;Gait training;Functional mobility training;Patient/family education;Therapeutic activities;Therapeutic exercise   PT Goals (Current goals can be found in the Care Plan section) Acute Rehab PT Goals PT Goal Formulation: With patient Time For Goal Achievement: 05/29/16 Potential to Achieve Goals: Good    Frequency Min 3X/week   Barriers to discharge        Co-evaluation                End of Session Equipment Utilized During Treatment: Gait belt Activity Tolerance: Patient tolerated treatment well Patient left: in chair;with call bell/phone within reach;with chair alarm set           Time: [id number] PT Time Calculation (min) (ACUTE ONLY): 17 min   Charges:   PT Evaluation $PT Eval Low Complexity: 1 Procedure     PT G Codes:        [place] Baria,KATHrine E 05/22/2016, 11:52 AM Carmelia Bake, PT, DPT 05/22/2016 Pager: [phone]

## 2016-05-22 NOTE — Progress Notes (Signed)
Went over all discharge information with patient and caregiver.  All questions answered. VSS.  [place] set up with patient.  Discharge summary given to patient.

## 2016-05-22 NOTE — Care Management Note (Signed)
Case Management Note  Patient Details  Name: Erika Day MRN: [id number] Date of Birth: Dec 30, 1929  Subjective/Objective:   PT-recc HHPT.HHPT ordered. Offered patient choice of [place] agency-used [facility] in past, chose to use again. Rep Santiago Glad aware of d/c & HHPT orders.                 Action/Plan:d/c home w/HHC.   Expected Discharge Date:   (unknown)               Expected Discharge Plan:  [place]  In-House Referral:     Discharge planning Services  CM Consult  Post Acute Care Choice:  Resumption of Svcs/PTA Provider (Private sitters-m-f.) Choice offered to:  Patient  DME Arranged:    DME Agency:     HH Arranged:  PT [place]:  [place]  Status of Service:  Completed, signed off  If discussed at [place] of Stay Meetings, dates discussed:    Additional Comments:  Dessa Phi, RN 05/22/2016, 10:38 AM

## 2016-05-25 ENCOUNTER — Telehealth: Payer: Self-pay | Admitting: *Deleted

## 2016-05-25 ENCOUNTER — Ambulatory Visit (HOSPITAL_COMMUNITY)
Admit: 2016-05-25 | Discharge: 2016-05-25 | Disposition: A | Discharge status: Home / Self Care | Payer: Medicare Other | Attending: Hematology and Oncology | Admitting: Hematology and Oncology

## 2016-05-25 DIAGNOSIS — I2510 Atherosclerotic heart disease of native coronary artery without angina pectoris: Secondary | ICD-10-CM | POA: Diagnosis not present

## 2016-05-25 DIAGNOSIS — I700 Atherosclerosis of aorta: Secondary | ICD-10-CM | POA: Insufficient documentation

## 2016-05-25 DIAGNOSIS — C8584 Other specified types of non-Hodgkin lymphoma, lymph nodes of axilla and upper limb: Secondary | ICD-10-CM | POA: Diagnosis present

## 2016-05-25 DIAGNOSIS — N200 Calculus of kidney: Secondary | ICD-10-CM | POA: Diagnosis not present

## 2016-05-25 DIAGNOSIS — K562 Volvulus: Secondary | ICD-10-CM | POA: Diagnosis not present

## 2016-05-25 LAB — GLUCOSE, CAPILLARY: Glucose-Capillary: 81 mg/dL (ref 65–99)

## 2016-05-25 MED ORDER — FLUDEOXYGLUCOSE F - 18 (FDG) INJECTION
6.4000 | Freq: Once | INTRAVENOUS | Status: AC | PRN
  Administered 2016-05-25: 6.4 via INTRAVENOUS

## 2016-05-25 NOTE — Telephone Encounter (Signed)
"  Can you tell me the person's name and when they're to be at the oncology place?"   Advised patient of F/U appointment with Dr. Alvy Bimler on 05-27-2016 at 12:00 pm.

## 2016-05-27 ENCOUNTER — Ambulatory Visit (HOSPITAL_COMMUNITY): Payer: Medicare Other

## 2016-05-27 ENCOUNTER — Ambulatory Visit (HOSPITAL_BASED_OUTPATIENT_CLINIC_OR_DEPARTMENT_OTHER): Payer: Medicare Other | Admitting: Hematology and Oncology

## 2016-05-27 ENCOUNTER — Telehealth: Payer: Self-pay | Admitting: Hematology and Oncology

## 2016-05-27 VITALS — BP 154/59 | HR 64 | Temp 98.8°F | Resp 16 | Ht 62.0 in | Wt 133.5 lb

## 2016-05-27 DIAGNOSIS — D61818 Other pancytopenia: Secondary | ICD-10-CM

## 2016-05-27 DIAGNOSIS — K562 Volvulus: Secondary | ICD-10-CM

## 2016-05-27 DIAGNOSIS — C884 Extranodal marginal zone B-cell lymphoma of mucosa-associated lymphoid tissue [MALT-lymphoma]: Secondary | ICD-10-CM

## 2016-05-27 DIAGNOSIS — C8584 Other specified types of non-Hodgkin lymphoma, lymph nodes of axilla and upper limb: Secondary | ICD-10-CM

## 2016-05-27 DIAGNOSIS — E43 Unspecified severe protein-calorie malnutrition: Secondary | ICD-10-CM | POA: Diagnosis not present

## 2016-05-27 NOTE — Progress Notes (Signed)
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 2. Mild hypermetabolism in the left thyroid, nonspecific      INTERVAL HISTORY: Please see below for problem oriented charting. She returns for further follow-up. Her appetite is slowly improving. She had recent volvulus, managed conservatively. The patient denies any recent signs or symptoms of bleeding such as spontaneous epistaxis, hematuria or hematochezia. She denies recent infection  REVIEW OF SYSTEMS:   Constitutional: Denies fevers, chills or abnormal weight loss Eyes: Denies blurriness of vision Ears, nose, mouth, throat, and face: Denies mucositis or sore throat Respiratory: Denies cough, dyspnea or wheezes Cardiovascular: Denies palpitation, chest discomfort or lower extremity swelling Gastrointestinal:  Denies nausea, heartburn or change in bowel habits Skin: Denies abnormal skin rashes Lymphatics: Denies new lymphadenopathy or easy bruising Neurological:Denies numbness, tingling or new weaknesses Behavioral/Psych: Mood is stable, no new changes  All other systems were reviewed with the patient and are negative.  I have  reviewed the past medical history, past surgical history, social history and family history with the patient and they are unchanged from previous note.  ALLERGIES:  is allergic to morphine and related.  MEDICATIONS:  Current Outpatient Prescriptions  Medication Sig Dispense Refill  . aspirin EC 81 MG tablet Take 81 mg by mouth daily.    . furosemide (LASIX) 20 MG tablet Take 20 mg by mouth daily as needed for fluid.    Marland Kitchen lisinopril (PRINIVIL,ZESTRIL) 10 MG tablet Take 10 mg by mouth daily. Reported on 01/31/2016    . pantoprazole (PROTONIX) 40 MG tablet Take 40 mg by mouth daily.    . polyethylene glycol (MIRALAX / GLYCOLAX) packet Take 17 g by mouth 2 (two) times daily. 14 each 0  . prednisoLONE acetate (PRED FORTE) 1 % ophthalmic suspension Place 1-2 drops into both eyes See admin instructions. 2 drops in right eye and 1 drop in the left eye twice daily    . sertraline (ZOLOFT) 25 MG tablet Take 25 mg by mouth daily. Reported on 05/19/2016  2   No current facility-administered medications for this visit.     PHYSICAL EXAMINATION: ECOG PERFORMANCE STATUS: 2 - Symptomatic, <50% confined to bed  Vitals:   05/27/16 1201  BP: (!) 154/59  Pulse: 64  Resp: 16  Temp: 98.8 F (37.1 C)   Filed Weights   05/27/16 1201  Weight: 133 lb 8 oz (60.6 kg)    GENERAL:alert, no distress and comfortable SKIN: skin color, texture, turgor are normal, no rashes or significant lesions EYES: normal, Conjunctiva are pink and non-injected, sclera clear OROPHARYNX:no exudate, no erythema and lips, buccal mucosa, and tongue normal  NECK: supple, thyroid normal size, non-tender,  without nodularity LYMPH:  no palpable lymphadenopathy in the cervical, axillary or inguinal LUNGS: clear to auscultation and percussion with normal breathing effort HEART: regular rate & rhythm and no murmurs and no lower extremity edema ABDOMEN:abdomen soft, non-tender and normal bowel sounds Musculoskeletal:no cyanosis of  digits and no clubbing  NEURO: alert & oriented x 3 with fluent speech, no focal motor/sensory deficits  LABORATORY DATA:  I have reviewed the data as listed    Component Value Date/Time   NA 136 05/20/2016 0506   NA 139 05/15/2016 1233   K 4.2 05/20/2016 0506   K 4.4 05/15/2016 1233   CL 107 05/20/2016 0506   CO2 24 05/20/2016 0506   CO2 24 05/15/2016 1233   GLUCOSE 63 (L) 05/20/2016 0506   GLUCOSE 127 05/15/2016 1233   BUN 12 05/20/2016 0506   BUN 11.6 05/15/2016 1233   CREATININE 0.92 05/20/2016 0506   CREATININE 1.0 05/15/2016 1233   CALCIUM 9.1 05/20/2016 0506   CALCIUM 9.5 05/15/2016 1233   PROT 8.1 05/19/2016 1158   PROT 7.6 05/15/2016 1233   ALBUMIN 4.1 05/19/2016 1158   ALBUMIN 3.4 (L) 05/15/2016 1233   AST 21 05/19/2016 1158   AST 13 05/15/2016 1233   ALT 12 (L) 05/19/2016 1158   ALT <9 05/15/2016 1233   ALKPHOS 87 05/19/2016 1158   ALKPHOS 83 05/15/2016 1233   BILITOT 0.2 (L) 05/19/2016 1158   BILITOT 0.30 05/15/2016 1233   GFRNONAA 56 (L) 05/20/2016 0506   GFRAA >60 05/20/2016 0506    No results found for: SPEP, UPEP  Lab Results  Component Value Date   WBC 11.3 (H) 05/20/2016   NEUTROABS 2.6 05/19/2016   HGB 10.0 (L) 05/20/2016   HCT 29.9 (L) 05/20/2016   MCV 84.9 05/20/2016   PLT 122 (L) 05/20/2016      Chemistry      Component Value Date/Time   NA 136 05/20/2016 0506   NA 139 05/15/2016 1233   K 4.2 05/20/2016 0506   K 4.4 05/15/2016 1233   CL 107 05/20/2016 0506   CO2 24 05/20/2016 0506   CO2 24 05/15/2016 1233   BUN 12 05/20/2016 0506   BUN 11.6 05/15/2016 1233   CREATININE 0.92 05/20/2016 0506   CREATININE 1.0 05/15/2016 1233      Component Value Date/Time   CALCIUM 9.1 05/20/2016 0506   CALCIUM 9.5 05/15/2016 1233   ALKPHOS 87 05/19/2016 1158   ALKPHOS 83 05/15/2016 1233   AST 21 05/19/2016 1158   AST 13 05/15/2016 1233   ALT 12 (L) 05/19/2016 1158   ALT <9 05/15/2016 1233   BILITOT 0.2 (L) 05/19/2016 1158   BILITOT 0.30  05/15/2016 1233       RADIOGRAPHIC STUDIES:I reviewed PET scan with patient and family I have personally reviewed the radiological images as listed and agreed with the findings in the report.    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 She is frail with other major comorbidities. I do not feel strongly that she needs to start on any treatment. She can be observed. I will see her back in a few months to repeat blood work history and physical examination  Pancytopenia, acquired ([place]) Likely due to lymphoma She does not need transfusions for now She is not symptomatic  Protein-calorie malnutrition, severe ([place]) She had recent weight loss. Recommend increase oral intake as tolerated along with nutritional supplements  Volvulus of sigmoid colon ([place]) She had  recent volvulus treated conservatively. She had evidence of moderate stool burden. I recommend regular laxatives as tolerated    Orders Placed This Encounter  Procedures  . CBC with Differential/Platelet    Standing Status:   Future    Standing Expiration Date:   07/01/2017  . Comprehensive metabolic panel    Standing Status:   Future    Standing Expiration Date:   07/01/2017  . Flow Cytometry    Exclude hairy cell    Standing Status:   Future    Standing Expiration Date:   07/01/2017   All questions were answered. The patient knows to call the clinic with any problems, questions or concerns. No barriers to learning was detected. I spent 15 minutes counseling the patient face to face. The total time spent in the appointment was 20 minutes and more than 50% was on counseling and review of test results     [facility], [place], MD 05/27/2016 5:33 PM

## 2016-05-27 NOTE — Telephone Encounter (Signed)
per pof to sch pt appt-gave pt copy of avs/cal °

## 2016-05-28 ENCOUNTER — Encounter: Payer: Self-pay | Admitting: Nutrition

## 2016-05-28 ENCOUNTER — Encounter: Payer: Self-pay | Admitting: Hematology and Oncology

## 2016-05-28 NOTE — Assessment & Plan Note (Signed)
She had recent weight loss. Recommend increase oral intake as tolerated along with nutritional supplements

## 2016-05-28 NOTE — Assessment & Plan Note (Signed)
She has minimal disease burden. She is frail with other major comorbidities. I do not feel strongly that she needs to start on any treatment. She can be observed. I will see her back in a few months to repeat blood work history and physical examination

## 2016-05-28 NOTE — Progress Notes (Signed)
Contacted patient and spoke to her daughter. Daughter confirms patient's need for supplements. States financial concerns with purchasing. Provided one complimentary case of Ensure Plus.

## 2016-05-28 NOTE — Assessment & Plan Note (Signed)
She had recent volvulus treated conservatively. She had evidence of moderate stool burden. I recommend regular laxatives as tolerated

## 2016-05-28 NOTE — Assessment & Plan Note (Signed)
Likely due to lymphoma She does not need transfusions for now She is not symptomatic

## 2016-06-05 ENCOUNTER — Other Ambulatory Visit: Payer: Self-pay | Admitting: *Deleted

## 2016-06-05 MED ORDER — LISINOPRIL 10 MG PO TABS: 10.0000 mg | ORAL_TABLET | Freq: Every day | ORAL | 0 refills | Status: DC

## 2016-06-05 MED ORDER — SERTRALINE HCL 25 MG PO TABS: 25.0000 mg | ORAL_TABLET | Freq: Every day | ORAL | 0 refills | Status: DC

## 2016-06-05 MED ORDER — ASPIRIN EC 81 MG PO TBEC: 81.0000 mg | DELAYED_RELEASE_TABLET | Freq: Every day | ORAL | 0 refills | Status: DC

## 2016-06-05 MED ORDER — PANTOPRAZOLE SODIUM 40 MG PO TBEC: 40.0000 mg | DELAYED_RELEASE_TABLET | Freq: Every day | ORAL | 0 refills | Status: DC

## 2016-06-05 NOTE — Telephone Encounter (Signed)
Pt states she did not get any of her prescriptions, except for miralax, when she was discharged from hospital. Dr Alvy Bimler saw patient in office after D/C. Will refill zoloft, protonix, lisinopril and ASA. Does not want patient to take lasix at time. Will fill for 30 days, but patient needs to follow up with PCP. Pt verbalized understanding

## 2016-06-15 ENCOUNTER — Encounter: Payer: Self-pay | Admitting: Nutrition

## 2016-06-15 NOTE — Progress Notes (Signed)
I provided second complimentary case of Ensure Plus. Also provided patient with samples of Carnation breakfast essentials since patient does tolerate this product and it will be more cost efficient. Provided patient with coupons.

## 2016-06-16 ENCOUNTER — Encounter: Payer: Self-pay | Admitting: Nutrition

## 2016-06-16 NOTE — Progress Notes (Signed)
Patient received a third case of Ensure Plus.

## 2016-06-18 LAB — FISH, PERIPHERAL BLOOD

## 2016-06-27 ENCOUNTER — Encounter (HOSPITAL_COMMUNITY): Payer: Self-pay | Admitting: *Deleted

## 2016-06-27 ENCOUNTER — Emergency Department (HOSPITAL_COMMUNITY): Payer: Medicare Other

## 2016-06-27 ENCOUNTER — Inpatient Hospital Stay (HOSPITAL_COMMUNITY)
Admission: EM | Admit: 2016-06-27 | Discharge: 2016-07-03 | DRG: 329 | Disposition: A | Discharge status: Skilled Nursing Facility | Payer: Medicare Other | Attending: Internal Medicine | Admitting: Internal Medicine

## 2016-06-27 DIAGNOSIS — E119 Type 2 diabetes mellitus without complications: Secondary | ICD-10-CM | POA: Diagnosis present

## 2016-06-27 DIAGNOSIS — E1165 Type 2 diabetes mellitus with hyperglycemia: Secondary | ICD-10-CM | POA: Diagnosis present

## 2016-06-27 DIAGNOSIS — Z79899 Other long term (current) drug therapy: Secondary | ICD-10-CM

## 2016-06-27 DIAGNOSIS — K562 Volvulus: Secondary | ICD-10-CM | POA: Diagnosis not present

## 2016-06-27 DIAGNOSIS — K432 Incisional hernia without obstruction or gangrene: Secondary | ICD-10-CM

## 2016-06-27 DIAGNOSIS — E876 Hypokalemia: Secondary | ICD-10-CM | POA: Diagnosis not present

## 2016-06-27 DIAGNOSIS — E785 Hyperlipidemia, unspecified: Secondary | ICD-10-CM | POA: Diagnosis present

## 2016-06-27 DIAGNOSIS — R109 Unspecified abdominal pain: Secondary | ICD-10-CM | POA: Diagnosis present

## 2016-06-27 DIAGNOSIS — D696 Thrombocytopenia, unspecified: Secondary | ICD-10-CM | POA: Diagnosis present

## 2016-06-27 DIAGNOSIS — C8510 Unspecified B-cell lymphoma, unspecified site: Secondary | ICD-10-CM | POA: Diagnosis present

## 2016-06-27 DIAGNOSIS — F419 Anxiety disorder, unspecified: Secondary | ICD-10-CM | POA: Diagnosis present

## 2016-06-27 DIAGNOSIS — C8304 Small cell B-cell lymphoma, lymph nodes of axilla and upper limb: Secondary | ICD-10-CM | POA: Diagnosis present

## 2016-06-27 DIAGNOSIS — Z8673 Personal history of transient ischemic attack (TIA), and cerebral infarction without residual deficits: Secondary | ICD-10-CM

## 2016-06-27 DIAGNOSIS — IMO0002 Reserved for concepts with insufficient information to code with codable children: Secondary | ICD-10-CM | POA: Diagnosis present

## 2016-06-27 DIAGNOSIS — Z6822 Body mass index (BMI) 22.0-22.9, adult: Secondary | ICD-10-CM

## 2016-06-27 DIAGNOSIS — K0889 Other specified disorders of teeth and supporting structures: Secondary | ICD-10-CM | POA: Diagnosis present

## 2016-06-27 DIAGNOSIS — E43 Unspecified severe protein-calorie malnutrition: Secondary | ICD-10-CM | POA: Diagnosis present

## 2016-06-27 DIAGNOSIS — D638 Anemia in other chronic diseases classified elsewhere: Secondary | ICD-10-CM | POA: Diagnosis present

## 2016-06-27 DIAGNOSIS — Z7982 Long term (current) use of aspirin: Secondary | ICD-10-CM

## 2016-06-27 DIAGNOSIS — C9110 Chronic lymphocytic leukemia of B-cell type not having achieved remission: Secondary | ICD-10-CM | POA: Diagnosis present

## 2016-06-27 DIAGNOSIS — D649 Anemia, unspecified: Secondary | ICD-10-CM | POA: Diagnosis present

## 2016-06-27 DIAGNOSIS — I10 Essential (primary) hypertension: Secondary | ICD-10-CM | POA: Diagnosis present

## 2016-06-27 DIAGNOSIS — Z993 Dependence on wheelchair: Secondary | ICD-10-CM

## 2016-06-27 DIAGNOSIS — I272 Other secondary pulmonary hypertension: Secondary | ICD-10-CM | POA: Diagnosis present

## 2016-06-27 DIAGNOSIS — K219 Gastro-esophageal reflux disease without esophagitis: Secondary | ICD-10-CM | POA: Diagnosis present

## 2016-06-27 DIAGNOSIS — Z87891 Personal history of nicotine dependence: Secondary | ICD-10-CM

## 2016-06-27 DIAGNOSIS — D61818 Other pancytopenia: Secondary | ICD-10-CM | POA: Diagnosis present

## 2016-06-27 LAB — COMPREHENSIVE METABOLIC PANEL
ALK PHOS: 71 U/L (ref 38–126)
ALT: 10 U/L — AB (ref 14–54)
AST: 19 U/L (ref 15–41)
Albumin: 3.9 g/dL (ref 3.5–5.0)
Anion gap: 6 (ref 5–15)
BUN: 22 mg/dL — AB (ref 6–20)
CHLORIDE: 107 mmol/L (ref 101–111)
CO2: 26 mmol/L (ref 22–32)
CREATININE: 0.97 mg/dL (ref 0.44–1.00)
Calcium: 9.8 mg/dL (ref 8.9–10.3)
GFR calc Af Amer: >60 mL/min (ref >60)
GFR, EST NON AFRICAN AMERICAN: 52 mL/min — AB (ref >60)
Glucose, Bld: 161 mg/dL — ABNORMAL HIGH (ref 65–99)
Potassium: 3.9 mmol/L (ref 3.5–5.1)
Sodium: 139 mmol/L (ref 135–145)
Total Bilirubin: 0.5 mg/dL (ref 0.3–1.2)
Total Protein: 7.8 g/dL (ref 6.5–8.1)

## 2016-06-27 LAB — CBC
HCT: 32.2 % — ABNORMAL LOW (ref 36.0–46.0)
Hemoglobin: 10.5 g/dL — ABNORMAL LOW (ref 12.0–15.0)
MCH: 28.3 pg (ref 26.0–34.0)
MCHC: 32.6 g/dL (ref 30.0–36.0)
MCV: 86.8 fL (ref 78.0–100.0)
PLATELETS: 131 10*3/uL — AB (ref 150–400)
RBC: 3.71 MIL/uL — ABNORMAL LOW (ref 3.87–5.11)
RDW: 14 % (ref 11.5–15.5)
WBC: 11.7 10*3/uL — AB (ref 4.0–10.5)

## 2016-06-27 LAB — LIPASE, BLOOD: LIPASE: 43 U/L (ref 11–51)

## 2016-06-27 MED ORDER — SODIUM CHLORIDE 0.9 % IV SOLN
INTRAVENOUS | Status: DC
  Administered 2016-06-27 – 2016-07-01 (×8): via INTRAVENOUS

## 2016-06-27 MED ORDER — IOPAMIDOL (ISOVUE-300) INJECTION 61%
100.0000 mL | Freq: Once | INTRAVENOUS | Status: AC | PRN
  Administered 2016-06-27: 100 mL via INTRAVENOUS

## 2016-06-27 MED ORDER — ONDANSETRON HCL 4 MG/2ML IJ SOLN: 4.0000 mg | Freq: Once | INTRAMUSCULAR | Status: DC

## 2016-06-27 MED ORDER — FENTANYL CITRATE (PF) 100 MCG/2ML IJ SOLN
50.0000 ug | Freq: Once | INTRAMUSCULAR | Status: AC
  Administered 2016-06-27: 50 ug via INTRAVENOUS
  Filled 2016-06-27: qty 2

## 2016-06-27 NOTE — ED Provider Notes (Signed)
[facility] DEPT Provider Note   CSN: [id number] Arrival date & time: 06/27/16  2027     History   Chief Complaint Chief Complaint  Patient presents with  . Abdominal Pain    HPI Erika Day is a 82 y.o. female.  82 year old female presents with 2 day history of abdominal discomfort similar to her prior to sigmoid volvulus. She has had nausea that was treated with ginger ale but denies any vomiting. No fever or chills. Has had decreased stools. Pain is suprapubic and without complaints of dysuria or hematuria. Symptoms have been progressively worse. Nothing makes them better. No treatment used prior to arrival      Past Medical History:  Diagnosis Date  . Acid reflux disease   . Cancer [facility])    Looking for documentation of this  . CLL (chronic lymphocytic leukemia) ([place]) 05/15/2016  . DDD (degenerative disc disease), lumbar   . Degenerative joint disease   . Diabetes mellitus without complication ([place])   . Dyslipidemia   . Hypertension   . 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 Panic attacks   . Pneumonia 02/2016  . 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 Stroke [facility]) age 42 yo   poorly controlled DM and Hypertension    Patient Active Problem List   Diagnosis Date Noted  . Pressure ulcer 05/20/2016  . Lower extremity edema 05/20/2016  . Protein-calorie malnutrition, severe ([place]) 05/20/2016  . Volvulus of sigmoid colon ([place])   . Sigmoid volvulus ([place]) 05/19/2016  . Pancytopenia, acquired ([place]) 05/17/2016  . CLL (chronic lymphocytic leukemia) ([place]) 05/15/2016  . 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 Constipation 05/15/2016  . Other fatigue 05/15/2016  . CAP (community acquired pneumonia) 02/13/2016  . Anemia, unspecified 02/13/2016  . Thrombocytopenia ([place]) 02/13/2016  . Hyponatremia 02/13/2016  . Kyphosis 02/01/2016  . Pressure ulcer  of coccygeal region 01/31/2016  . Mobility impaired 01/31/2016  . Osteoarthritis 01/31/2016  . Abdominal pain, unspecified site 04/23/2014  . Essential hypertension 09/06/2011  . DM (diabetes mellitus), type 2, uncontrolled ([place]) 09/06/2011  . Ovarian torsion 08/25/2011    Past Surgical History:  Procedure Laterality Date  . ABDOMINAL HYSTERECTOMY  ? 08/25/2011 ?   Has BSO for left benign ovarian tumor with torsion and broad ligament benign tumor, but does not appear had hysterectomy--[facility], NOT ovarian cancer  . CHOLECYSTECTOMY    . FLEXIBLE SIGMOIDOSCOPY N/A 05/19/2016   Procedure: FLEXIBLE SIGMOIDOSCOPY;  Surgeon: [name], MD;  Location: [facility];  Service: Endoscopy;  Laterality: N/A;  . LAPAROTOMY N/A 04/29/2014   Procedure: EXPLORATORY LAPAROTOMY;  Surgeon: Imogene Burn. Georgette Dover, MD;  Location: [facility];  Service: General;  Laterality: N/A;  . LYSIS OF ADHESION N/A 04/29/2014   Procedure: EXTENSIVE LYSIS OF ADHESIONS;  Surgeon: Imogene Burn. Georgette Dover, MD;  Location: [facility];  Service: General;  Laterality: N/A;  . VENTRAL HERNIA REPAIR N/A 04/29/2014   Procedure: PRIMARY REPAIR OF VENTRAL HERNIA;  Surgeon: Imogene Burn. Georgette Dover, MD;  Location: [facility];  Service: General;  Laterality: N/A;    OB History    No data available       Home Medications    Prior to Admission medications   Medication Sig Start Date End Date Taking? Authorizing Provider  aspirin EC 81 MG tablet Take 1 tablet (81 mg total) by mouth daily. 06/05/16   Heath Lark, MD  furosemide (LASIX) 20 MG tablet Take 20 mg by mouth daily as needed for fluid.    Historical Provider, MD  lisinopril (PRINIVIL,ZESTRIL) 10 MG tablet Take 1 tablet (10 mg total) by mouth daily. Reported on 01/31/2016 06/05/16   Heath Lark, MD  pantoprazole (PROTONIX) 40 MG tablet Take 1 tablet (40 mg total) by mouth daily. 06/05/16   Heath Lark, MD  polyethylene glycol (MIRALAX / GLYCOLAX) packet Take 17 g by mouth 2 (two) times daily. 05/22/16   Charlynne Cousins, MD  prednisoLONE acetate (PRED FORTE) 1 % ophthalmic suspension Place 1-2 drops into both eyes See admin instructions. 2 drops in right eye and 1 drop in the left eye twice daily    Historical Provider, MD  sertraline (ZOLOFT) 25 MG tablet Take 1 tablet (25 mg total) by mouth daily. Reported on 05/19/2016 06/05/16   Heath Lark, MD    Family History Family History  Problem Relation Age of Onset  . Hypertension Mother   . Stroke Mother   . Arthritis Mother   . Alcohol abuse Father     Social History Social History  Substance Use Topics  . Smoking status: Former Smoker    Types: Cigarettes    Quit date: 02/18/1990  . Smokeless tobacco: Former Systems developer    Types: Snuff    Quit date: 02/18/1990  . Alcohol use No     Comment: Only drank alcohol between ages of 39 and 72 yo.  Her father gave it to her     Allergies   Morphine and related   Review of Systems Review of Systems  All other systems reviewed and are negative.    Physical Exam Updated Vital Signs BP 158/90 (BP Location: Left Arm)   Pulse 92   Temp 99 F (37.2 C) (Oral)   Resp 16   Ht 5\' 4"  (1.626 m)   Wt 59 kg   SpO2 98%   BMI 22.31 kg/m   Physical Exam  Constitutional: She is oriented to person, place, and time. She appears well-developed and well-nourished.  Non-toxic appearance. No distress.  HENT:  Head: Normocephalic and atraumatic.  Eyes: Conjunctivae, EOM and lids are normal. Pupils are equal, round, and reactive to light.  Neck: Normal range of motion. Neck supple. No tracheal deviation present. No thyroid mass present.  Cardiovascular: Normal rate, regular rhythm and normal heart sounds.  Exam reveals no gallop.   No murmur heard. Pulmonary/Chest: Effort normal and breath sounds normal. No stridor. No respiratory distress. She has no decreased breath sounds. She has no wheezes. She has no rhonchi. She has no rales.  Abdominal: Soft. Normal appearance and bowel sounds are normal. She exhibits no  distension. There is tenderness in the periumbilical area and suprapubic area. There is no rebound and no CVA tenderness.    Musculoskeletal: Normal range of motion. She exhibits no edema or tenderness.  Neurological: She is alert and oriented to person, place, and time. She has normal strength. No cranial nerve deficit or sensory deficit. GCS eye subscore is 4. GCS verbal subscore is 5. GCS motor subscore is 6.  Skin: Skin is warm and dry. No abrasion and no rash noted.  Psychiatric: She has a normal mood and affect. Her speech is normal and behavior is normal.  Nursing note and vitals reviewed.    ED Treatments / Results  Labs (all labs ordered are listed, but only abnormal results are displayed) Labs Reviewed  LIPASE, BLOOD  COMPREHENSIVE METABOLIC PANEL  CBC  URINALYSIS, ROUTINE W REFLEX MICROSCOPIC (NOT AT [facility])    EKG  EKG Interpretation None       Radiology No results found.  Procedures Procedures (including critical care time)  Medications Ordered in ED Medications  0.9 %  sodium chloride infusion (not administered)  ondansetron (ZOFRAN) injection 4 mg (not administered)     Initial Impression / Assessment and Plan / ED Course  I have reviewed the triage vital signs and the nursing notes.  Pertinent labs & imaging results that were available during my care of the patient were reviewed by me and considered in my medical decision making (see chart for details).  Clinical Course    Patient given Zofran for nausea. Abdominal CT consistent with sigmoid volvulus with resultant large bowel obstruction. Spoke with Dr. Fuller Plan from gastroenterology will see patient in consultation. Patient will be admitted to the medicine service  Final Clinical Impressions(s) / ED Diagnoses   Final diagnoses:  None    New Prescriptions New Prescriptions   No medications on file     Lacretia Leigh, MD 06/27/16 2355

## 2016-06-27 NOTE — ED Notes (Signed)
Patient transported to CT 

## 2016-06-27 NOTE — ED Triage Notes (Signed)
Pt complains of lower abdominal pain, diarrhea for the past 2 days. Pt states eating or drinking makes her symptoms worse. Pt daughter states the pt had a twisted bowel, which had similar symptoms.

## 2016-06-28 ENCOUNTER — Encounter (HOSPITAL_COMMUNITY)
Admission: EM | Disposition: A | Discharge status: Skilled Nursing Facility | Payer: Self-pay | Source: Home / Self Care | Attending: Internal Medicine

## 2016-06-28 ENCOUNTER — Encounter (HOSPITAL_COMMUNITY): Payer: Self-pay | Admitting: Internal Medicine

## 2016-06-28 DIAGNOSIS — K5660 Unspecified intestinal obstruction: Secondary | ICD-10-CM | POA: Diagnosis not present

## 2016-06-28 DIAGNOSIS — F419 Anxiety disorder, unspecified: Secondary | ICD-10-CM | POA: Diagnosis present

## 2016-06-28 DIAGNOSIS — E785 Hyperlipidemia, unspecified: Secondary | ICD-10-CM | POA: Diagnosis present

## 2016-06-28 DIAGNOSIS — R109 Unspecified abdominal pain: Secondary | ICD-10-CM | POA: Diagnosis present

## 2016-06-28 DIAGNOSIS — C8304 Small cell B-cell lymphoma, lymph nodes of axilla and upper limb: Secondary | ICD-10-CM | POA: Diagnosis not present

## 2016-06-28 DIAGNOSIS — K219 Gastro-esophageal reflux disease without esophagitis: Secondary | ICD-10-CM | POA: Diagnosis present

## 2016-06-28 DIAGNOSIS — Z993 Dependence on wheelchair: Secondary | ICD-10-CM | POA: Diagnosis not present

## 2016-06-28 DIAGNOSIS — I10 Essential (primary) hypertension: Secondary | ICD-10-CM | POA: Diagnosis present

## 2016-06-28 DIAGNOSIS — E876 Hypokalemia: Secondary | ICD-10-CM | POA: Diagnosis not present

## 2016-06-28 DIAGNOSIS — R1084 Generalized abdominal pain: Secondary | ICD-10-CM | POA: Diagnosis not present

## 2016-06-28 DIAGNOSIS — C9110 Chronic lymphocytic leukemia of B-cell type not having achieved remission: Secondary | ICD-10-CM | POA: Diagnosis present

## 2016-06-28 DIAGNOSIS — E43 Unspecified severe protein-calorie malnutrition: Secondary | ICD-10-CM | POA: Diagnosis present

## 2016-06-28 DIAGNOSIS — K0889 Other specified disorders of teeth and supporting structures: Secondary | ICD-10-CM | POA: Diagnosis present

## 2016-06-28 DIAGNOSIS — I272 Other secondary pulmonary hypertension: Secondary | ICD-10-CM | POA: Diagnosis present

## 2016-06-28 DIAGNOSIS — D61818 Other pancytopenia: Secondary | ICD-10-CM | POA: Diagnosis present

## 2016-06-28 DIAGNOSIS — C8510 Unspecified B-cell lymphoma, unspecified site: Secondary | ICD-10-CM | POA: Diagnosis present

## 2016-06-28 DIAGNOSIS — E119 Type 2 diabetes mellitus without complications: Secondary | ICD-10-CM | POA: Diagnosis present

## 2016-06-28 DIAGNOSIS — D696 Thrombocytopenia, unspecified: Secondary | ICD-10-CM

## 2016-06-28 DIAGNOSIS — D649 Anemia, unspecified: Secondary | ICD-10-CM | POA: Diagnosis not present

## 2016-06-28 DIAGNOSIS — Z87891 Personal history of nicotine dependence: Secondary | ICD-10-CM | POA: Diagnosis not present

## 2016-06-28 DIAGNOSIS — Z79899 Other long term (current) drug therapy: Secondary | ICD-10-CM | POA: Diagnosis not present

## 2016-06-28 DIAGNOSIS — K562 Volvulus: Secondary | ICD-10-CM | POA: Diagnosis present

## 2016-06-28 DIAGNOSIS — Z7982 Long term (current) use of aspirin: Secondary | ICD-10-CM | POA: Diagnosis not present

## 2016-06-28 DIAGNOSIS — Z6822 Body mass index (BMI) 22.0-22.9, adult: Secondary | ICD-10-CM | POA: Diagnosis not present

## 2016-06-28 DIAGNOSIS — K432 Incisional hernia without obstruction or gangrene: Secondary | ICD-10-CM | POA: Diagnosis present

## 2016-06-28 DIAGNOSIS — Z8673 Personal history of transient ischemic attack (TIA), and cerebral infarction without residual deficits: Secondary | ICD-10-CM | POA: Diagnosis not present

## 2016-06-28 HISTORY — PX: FLEXIBLE SIGMOIDOSCOPY: SHX5431

## 2016-06-28 LAB — COMPREHENSIVE METABOLIC PANEL
ALT: 11 U/L — AB (ref 14–54)
AST: 16 U/L (ref 15–41)
Albumin: 3.3 g/dL — ABNORMAL LOW (ref 3.5–5.0)
Alkaline Phosphatase: 62 U/L (ref 38–126)
Anion gap: 4 — ABNORMAL LOW (ref 5–15)
BILIRUBIN TOTAL: 0.6 mg/dL (ref 0.3–1.2)
BUN: 18 mg/dL (ref 6–20)
CHLORIDE: 110 mmol/L (ref 101–111)
CO2: 24 mmol/L (ref 22–32)
Calcium: 9.2 mg/dL (ref 8.9–10.3)
Creatinine, Ser: 0.87 mg/dL (ref 0.44–1.00)
GFR, EST NON AFRICAN AMERICAN: 59 mL/min — AB (ref >60)
GLUCOSE: 96 mg/dL (ref 65–99)
POTASSIUM: 3.3 mmol/L — AB (ref 3.5–5.1)
Sodium: 138 mmol/L (ref 135–145)
Total Protein: 6.8 g/dL (ref 6.5–8.1)

## 2016-06-28 LAB — URINALYSIS, ROUTINE W REFLEX MICROSCOPIC
Bilirubin Urine: NEGATIVE
Glucose, UA: NEGATIVE mg/dL
Hgb urine dipstick: NEGATIVE
KETONES UR: NEGATIVE mg/dL
LEUKOCYTES UA: NEGATIVE
NITRITE: NEGATIVE
PROTEIN: NEGATIVE mg/dL
Specific Gravity, Urine: 1.044 — ABNORMAL HIGH (ref 1.005–1.030)
pH: 6 (ref 5.0–8.0)

## 2016-06-28 LAB — GLUCOSE, CAPILLARY
GLUCOSE-CAPILLARY: 94 mg/dL (ref 65–99)
GLUCOSE-CAPILLARY: 79 mg/dL (ref 65–99)

## 2016-06-28 LAB — CBC
HEMATOCRIT: 29.4 % — AB (ref 36.0–46.0)
Hemoglobin: 9.6 g/dL — ABNORMAL LOW (ref 12.0–15.0)
MCH: 27.7 pg (ref 26.0–34.0)
MCHC: 32.7 g/dL (ref 30.0–36.0)
MCV: 85 fL (ref 78.0–100.0)
Platelets: 108 10*3/uL — ABNORMAL LOW (ref 150–400)
RBC: 3.46 MIL/uL — ABNORMAL LOW (ref 3.87–5.11)
RDW: 13.8 % (ref 11.5–15.5)
WBC: 9.4 10*3/uL (ref 4.0–10.5)

## 2016-06-28 LAB — PHOSPHORUS: PHOSPHORUS: 3 mg/dL (ref 2.5–4.6)

## 2016-06-28 LAB — MAGNESIUM: Magnesium: 1.8 mg/dL (ref 1.7–2.4)

## 2016-06-28 LAB — TSH: TSH: 2.318 u[IU]/mL (ref 0.350–4.500)

## 2016-06-28 SURGERY — SIGMOIDOSCOPY, FLEXIBLE
Anesthesia: Moderate Sedation

## 2016-06-28 MED ORDER — LABETALOL HCL 5 MG/ML IV SOLN
10.0000 mg | INTRAVENOUS | Status: DC | PRN
Start: 2016-06-28 — End: 2016-07-03
  Administered 2016-06-29: 10 mg via INTRAVENOUS
  Filled 2016-06-28 (×2): qty 4

## 2016-06-28 MED ORDER — ONDANSETRON HCL 4 MG/2ML IJ SOLN
4.0000 mg | Freq: Four times a day (QID) | INTRAMUSCULAR | Status: DC | PRN
  Administered 2016-06-30 – 2016-07-01 (×2): 4 mg via INTRAVENOUS
  Filled 2016-06-28 (×2): qty 2

## 2016-06-28 MED ORDER — PREDNISOLONE ACETATE 1 % OP SUSP
2.0000 [drp] | Freq: Two times a day (BID) | OPHTHALMIC | Status: DC
  Administered 2016-06-28 – 2016-07-03 (×10): 2 [drp] via OPHTHALMIC
  Filled 2016-06-28 (×2): qty 1

## 2016-06-28 MED ORDER — ONDANSETRON HCL 4 MG PO TABS: 4.0000 mg | ORAL_TABLET | Freq: Four times a day (QID) | ORAL | Status: DC | PRN

## 2016-06-28 MED ORDER — SERTRALINE HCL 25 MG PO TABS
25.0000 mg | ORAL_TABLET | Freq: Every day | ORAL | Status: DC
  Administered 2016-06-28 – 2016-07-03 (×5): 25 mg via ORAL
  Filled 2016-06-28 (×6): qty 1

## 2016-06-28 MED ORDER — MIDAZOLAM HCL 10 MG/2ML IJ SOLN
INTRAMUSCULAR | Status: DC | PRN
  Administered 2016-06-28: 2 mg via INTRAVENOUS

## 2016-06-28 MED ORDER — FENTANYL CITRATE (PF) 100 MCG/2ML IJ SOLN
INTRAMUSCULAR | Status: AC
  Filled 2016-06-28: qty 2

## 2016-06-28 MED ORDER — PREDNISOLONE ACETATE 1 % OP SUSP
1.0000 [drp] | Freq: Two times a day (BID) | OPHTHALMIC | Status: DC
  Administered 2016-06-28 – 2016-07-03 (×10): 1 [drp] via OPHTHALMIC

## 2016-06-28 MED ORDER — FENTANYL CITRATE (PF) 100 MCG/2ML IJ SOLN
INTRAMUSCULAR | Status: DC | PRN
  Administered 2016-06-28: 25 ug via INTRAVENOUS

## 2016-06-28 MED ORDER — PREDNISOLONE ACETATE 1 % OP SUSP: 1.0000 [drp] | OPHTHALMIC | Status: DC

## 2016-06-28 MED ORDER — MIDAZOLAM HCL 5 MG/ML IJ SOLN
INTRAMUSCULAR | Status: AC
  Filled 2016-06-28: qty 2

## 2016-06-28 MED ORDER — SODIUM CHLORIDE 0.9 % IV SOLN
INTRAVENOUS | Status: DC
  Administered 2016-06-28 (×2): via INTRAVENOUS

## 2016-06-28 MED ORDER — FENTANYL CITRATE (PF) 100 MCG/2ML IJ SOLN
50.0000 ug | INTRAMUSCULAR | Status: DC | PRN
  Administered 2016-07-01 – 2016-07-02 (×3): 50 ug via INTRAVENOUS
  Filled 2016-06-28 (×3): qty 2

## 2016-06-28 MED ORDER — ENOXAPARIN SODIUM 40 MG/0.4ML ~~LOC~~ SOLN
40.0000 mg | SUBCUTANEOUS | Status: DC
  Administered 2016-06-28 – 2016-07-02 (×4): 40 mg via SUBCUTANEOUS
  Filled 2016-06-28 (×4): qty 0.4

## 2016-06-28 MED ORDER — SODIUM CHLORIDE 0.9% FLUSH
3.0000 mL | Freq: Two times a day (BID) | INTRAVENOUS | Status: DC
  Administered 2016-06-28 – 2016-07-01 (×6): 3 mL via INTRAVENOUS

## 2016-06-28 MED ORDER — PANTOPRAZOLE SODIUM 40 MG PO TBEC
40.0000 mg | DELAYED_RELEASE_TABLET | Freq: Every day | ORAL | Status: DC
  Administered 2016-06-28 – 2016-07-03 (×6): 40 mg via ORAL
  Filled 2016-06-28 (×7): qty 1

## 2016-06-28 MED ORDER — DIPHENHYDRAMINE HCL 50 MG/ML IJ SOLN
INTRAMUSCULAR | Status: AC
  Filled 2016-06-28: qty 1

## 2016-06-28 MED ORDER — POTASSIUM CHLORIDE 10 MEQ/100ML IV SOLN
10.0000 meq | INTRAVENOUS | Status: AC
  Administered 2016-06-28 (×4): 10 meq via INTRAVENOUS
  Filled 2016-06-28 (×8): qty 100

## 2016-06-28 NOTE — Consult Note (Signed)
Consult Note   Referring Provider: Dr. Irven Easterly Primary Care Physician:  Philis Fendt, MD Primary Gastroenterologist:  none/unassigned  Reason for Consultation:  Abdominal pain, sigmoid volvulus   HPI: Erika Day is a 82 y.o. female with chronic constipation and history of a sigmoid volvulus last month decompressed with flex sigmoidoscopy. Pt was evaluated by [facility] and decision was made not to pursue surgery due to higher operative risk. CT reviewed. 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 Denies diarrhea, change in stool caliber, melena, hematochezia, nausea, vomiting, dysphagia, reflux symptoms, chest pain.   Past Medical History:  Diagnosis Date  . Acid reflux disease   . Cancer [facility])    Looking for documentation of this  . CLL (chronic lymphocytic leukemia) ([place]) 05/15/2016  . DDD (degenerative disc disease), lumbar   . Degenerative joint disease   . Diabetes mellitus without complication ([place])   . Dyslipidemia   . Hypertension   . 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 Panic attacks   . Pneumonia 02/2016  . 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 Stroke [facility]) age 40 yo   poorly controlled DM and Hypertension    Past Surgical History:  Procedure Laterality Date  . ABDOMINAL HYSTERECTOMY  ? 08/25/2011 ?   Has BSO for left benign ovarian tumor with torsion and broad ligament benign tumor, but does not appear had hysterectomy--[facility], NOT ovarian cancer  . CHOLECYSTECTOMY    . FLEXIBLE SIGMOIDOSCOPY N/A 05/19/2016   Procedure: FLEXIBLE SIGMOIDOSCOPY;  Surgeon: [name], MD;  Location: [facility];  Service: Endoscopy;  Laterality: N/A;  . LAPAROTOMY N/A 04/29/2014   Procedure: EXPLORATORY LAPAROTOMY;  Surgeon: Imogene Burn. Georgette Dover, MD;  Location: [place];  Service: General;  Laterality: N/A;  . LYSIS OF ADHESION N/A 04/29/2014   Procedure: EXTENSIVE LYSIS OF ADHESIONS;  Surgeon: Imogene Burn. Georgette Dover, MD;  Location: [place];   Service: General;  Laterality: N/A;  . VENTRAL HERNIA REPAIR N/A 04/29/2014   Procedure: PRIMARY REPAIR OF VENTRAL HERNIA;  Surgeon: Imogene Burn. Georgette Dover, MD;  Location: [place];  Service: General;  Laterality: N/A;    Prior to Admission medications   Medication Sig Start Date End Date Taking? Authorizing Provider  aspirin EC 81 MG tablet Take 1 tablet (81 mg total) by mouth daily. 06/05/16  Yes Heath Lark, MD  furosemide (LASIX) 20 MG tablet Take 20 mg by mouth daily as needed for fluid.   Yes Historical Provider, MD  lisinopril (PRINIVIL,ZESTRIL) 10 MG tablet Take 1 tablet (10 mg total) by mouth daily. Reported on 01/31/2016 06/05/16  Yes Heath Lark, MD  Loratadine (CLARITIN PO) Take 10 mg by mouth.   Yes Historical Provider, MD  pantoprazole (PROTONIX) 40 MG tablet Take 1 tablet (40 mg total) by mouth daily. 06/05/16  Yes Heath Lark, MD  polyethylene glycol powder (GLYCOLAX/MIRALAX) powder Take 17 g by mouth 3 (three) times daily. 06/16/16  Yes Historical Provider, MD  prednisoLONE acetate (PRED FORTE) 1 % ophthalmic suspension Place 1-2 drops into both eyes See admin instructions. 2 drops in right eye and 1 drop in the left eye twice daily   Yes Historical Provider, MD  sertraline (ZOLOFT) 25 MG tablet Take 1 tablet (25 mg total) by mouth daily. Reported on 05/19/2016 06/05/16  Yes Heath Lark, MD  polyethylene glycol (MIRALAX / GLYCOLAX) packet Take 17 g by mouth 2 (two) times daily. Patient not taking: Reported on 06/28/2016 05/22/16  Charlynne Cousins, MD    Current Facility-Administered Medications  Medication Dose Route Frequency Provider Last Rate Last Dose  . 0.9 %  sodium chloride infusion   Intravenous Continuous Lacretia Leigh, MD 125 mL/hr at 06/27/16 2125    . 0.9 %  sodium chloride infusion   Intravenous Continuous Toy Baker, MD 100 mL/hr at 06/28/16 0203    . fentaNYL (SUBLIMAZE) injection 50 mcg  50 mcg Intravenous Q2H PRN Toy Baker, MD      . labetalol (NORMODYNE,TRANDATE)  injection 10 mg  10 mg Intravenous Q2H PRN Toy Baker, MD      . ondansetron (ZOFRAN) tablet 4 mg  4 mg Oral Q6H PRN Toy Baker, MD       Or  . ondansetron (ZOFRAN) injection 4 mg  4 mg Intravenous Q6H PRN Toy Baker, MD      . prednisoLONE acetate (PRED FORTE) 1 % ophthalmic suspension 2 drop  2 drop Right Eye BID Toy Baker, MD       And  . prednisoLONE acetate (PRED FORTE) 1 % ophthalmic suspension 1 drop  1 drop Left Eye BID Toy Baker, MD      . sodium chloride flush (NS) 0.9 % injection 3 mL  3 mL Intravenous Q12H Toy Baker, MD   3 mL at 06/28/16 0203    Allergies as of 06/27/2016 - Review Complete 06/27/2016  Allergen Reaction Noted  . Morphine and related Other (See Comments) 04/23/2014    Family History  Problem Relation Age of Onset  . Hypertension Mother   . Stroke Mother   . Arthritis Mother   . Alcohol abuse Father     Social History   Social History  . Marital status: Married    Spouse name: Shelita Womeldorf  . Number of children: 0  . Years of education: N/A   Occupational History  . Retired housewife    Social History Main Topics  . Smoking status: Former Smoker    Types: Cigarettes    Quit date: 02/18/1990  . Smokeless tobacco: Former Systems developer    Types: Snuff    Quit date: 02/18/1990  . Alcohol use No     Comment: Only drank alcohol between ages of 72 and 46 yo.  Her father gave it to her  . Drug use: No  . Sexual activity: No   Other Topics Concern  . Not on file   Social History Narrative   Lives at home with her husband, [name], her "adopted daugher, [name]", and [name]'s little 80 yo boy.   Has been in a wheelchair because she kept falling--in wheelchair for about 10 years.   Sometimes gets up and holds onto furniture or wall to get around.     Did have a walker, but fell with that.  At one point had PT   Does not have any biologic children, but has cared for other family members' children through  childhood   [name] is the granddaughter of her 6rd cousin.  She raised [name]'s mother, [name] as well.     64 yo son of [name] also lives in their home.     He is in daycare and his great grandmother watches him when she is at work.   Has brought up about 7 children over past 50 years.       Review of Systems: Gen: Denies any fever, chills, sweats, anorexia, fatigue, weakness, malaise, weight loss, and sleep disorder CV: Denies chest pain, angina, palpitations, syncope, orthopnea, PND, peripheral  edema, and claudication. Resp: Denies dyspnea at rest, dyspnea with exercise, cough, sputum, wheezing, coughing up blood, and pleurisy. GI: Denies vomiting blood, jaundice, and fecal incontinence.   Denies dysphagia or odynophagia. GU : Denies urinary burning, blood in urine, urinary frequency, urinary hesitancy, nocturnal urination, and urinary incontinence. MS: Denies joint pain, limitation of movement, and swelling, stiffness, low back pain, extremity pain. Denies muscle weakness, cramps, atrophy.  Derm: Denies rash, itching, dry skin, hives, moles, warts, or unhealing ulcers.  Psych: Denies depression, anxiety, memory loss, suicidal ideation, hallucinations, paranoia, and confusion. Heme: Denies bruising, bleeding, and enlarged lymph nodes. Neuro:  Denies any headaches, dizziness, paresthesias. Endo:  Denies any problems with DM, thyroid, adrenal function.  Physical Exam: Vital signs in last 24 hours: Temp:  [98 F (36.7 C)-99 F (37.2 C)] 98.1 F (36.7 C) (08/27 0552) Pulse Rate:  [64-92] 64 (08/27 0552) Resp:  [16-18] 16 (08/27 0552) BP: (140-185)/(69-92) 140/69 (08/27 0552) SpO2:  [95 %-100 %] 97 % (08/27 0552) Weight:  [130 lb (59 kg)] 130 lb (59 kg) (08/26 2029)    General:  Alert, well-developed, well-nourished, elderly, uncomfortable Head:  Normocephalic and atraumatic. Eyes:  Sclera clear, no icterus. Conjunctiva pink. Arcus senilis ou. Ears:  Normal auditory  acuity. Nose:  No deformity, discharge, or lesions. Mouth:  No deformity or lesions. Oropharynx pink & moist. Neck:  Supple; no masses or thyromegaly. Chest:  Clear throughout to auscultation. No wheezes, crackles, or rhonchi. No acute distress. Heart:  Regular rate and rhythm; 3/6 systolic murmur, No clicks, rubs, or gallops. Abdomen:  Soft, diffusely tender and nondistended. No masses, hepatosplenomegaly or hernias noted. Tinkling, hyperactive bowel sounds, without guarding, and without rebound.  Rectal: deferred to flex sig   Msk:  Symmetrical without gross deformities. Normal posture. Pulses:  Normal pulses noted. Extremities:  Without clubbing or edema. Neurologic:  Alert and  oriented x4;  grossly normal neurologically. Skin:  Intact without significant lesions or rashes. Cervical Nodes:  No significant cervical adenopathy. Psych:  Alert and cooperative. Normal mood and affect.  Intake/Output from previous day: 08/26 0701 - 08/27 0700 In: 500 [I.V.:500] Out: -  Intake/Output this shift: Total I/O In: 500 [I.V.:500] Out: -   Lab Results:  Recent Labs  06/27/16 2133 06/28/16 0416  WBC 11.7* 9.4  HGB 10.5* 9.6*  HCT 32.2* 29.4*  PLT 131* 108*   BMET  Recent Labs  06/27/16 2133 06/28/16 0416  NA 139 138  K 3.9 3.3*  CL 107 110  CO2 26 24  GLUCOSE 161* 96  BUN 22* 18  CREATININE 0.97 0.87  CALCIUM 9.8 9.2   LFT  Recent Labs  06/28/16 0416  PROT 6.8  ALBUMIN 3.3*  AST 16  ALT 11*  ALKPHOS 62  BILITOT 0.6    Studies/Results: Ct Abdomen Pelvis W Contrast  Result Date: 06/27/2016 CLINICAL DATA:  Abdominal pain and diarrhea for 2 days. History of bowel volvulus, small bowel obstruction, ventral hernia repair and lymphoma. EXAM: CT ABDOMEN AND PELVIS WITH CONTRAST TECHNIQUE: Multidetector CT imaging of the abdomen and pelvis was performed using the standard protocol following bolus administration of intravenous contrast. CONTRAST:  125mL ISOVUE-300  IOPAMIDOL (ISOVUE-300) INJECTION 61% COMPARISON:  PET- CT May 22, 2016 and CT abdomen and pelvis May 18, 2015 FINDINGS: LUNG BASES: Elevated RIGHT hemidiaphragm mildly displacing the heart to the LEFT. Heart size is normal. Severe coronary artery calcifications. No pericardial effusion. Lung bases are clear. SOLID ORGANS: The liver demonstrates mild biliary dilatation, status  post cholecystectomy. Mild splenomegaly. Pancreas and adrenal glands are unremarkable. GASTROINTESTINAL TRACT: Sigmoid volvulus, with bird's beak appearance and associated swirled mesentery. Proximal colon is distended to 8.2 cm with large amount of stool and stool air-fluid levels. Distended rectum with air-fluid level. Enteric contrast in the small bowel KIDNEYS/ URINARY TRACT: Kidneys are orthotopic, demonstrating symmetric enhancement. Punctate LEFT upper pole nephrolithiasis. No hydronephrosis or solid renal masses. The unopacified ureters are normal in course and caliber. Delayed imaging through the kidneys demonstrates symmetric prompt contrast excretion within the proximal urinary collecting system. Urinary bladder is partially distended and unremarkable. PERITONEUM/RETROPERITONEUM: Tortuous abdominal aorta, normal in caliber with severe calcific atherosclerosis. No lymphadenopathy by CT size criteria. Internal reproductive organs are unremarkable. Small amount of ascites is likely reactive. No pneumoperitoneum. SOFT TISSUE/OSSEOUS STRUCTURES: Non-suspicious. Sub cm inguinal lymph nodes decreased in size from prior CT. Moderate sacroiliac osteoarthrosis. Severe degenerative change of the lumbar spine. Anterior abdominal wall scarring. IMPRESSION: Sigmoid volvulus resulting in large bowel obstruction. Splenomegaly, in keeping with patient's history of lymphoma. Severe atherosclerosis. Acute findings discussed with and reconfirmed by Dr.ANTHONY ALLEN on 06/27/2016 at 11:35 pm. Electronically Signed   By: Elon Alas M.D.   On:  06/27/2016 23:38    Impression/ Recommendations:  1. 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 Flex sigmoidoscopy today to attempt to decompress. Surgical consult to reconsider sigmoid colectomy or pexy. Correct hypokalemia.    LOS: 0 days   Todd Jelinski T. Fuller Plan MD 06/28/2016, 6:45 AM [id number] Mon-Fri 8a-5p  [id number] after 5p, weekends, holidays

## 2016-06-28 NOTE — ED Notes (Signed)
Pt.'s daughter [name] 's cell no. [phone]. Needs to be updated for any changes on pt.'s latest condition.

## 2016-06-28 NOTE — H&P (Signed)
Erika Day [id number] DOB: [birth year] DOA: 06/27/2016     PCP: Philis Fendt, MD   Outpatient Specialists: [facility]   Patient coming from:   home Lives  With family    Chief Complaint: Dental pain and distention  HPI: Erika Day is a 82 y.o. 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 Patient has history of recurrent volvulus and his symptoms are similar to prior. Daughter try to give some ginger ale but it did not seem to help no fevers or chills no chest pain shortness of breath she hasn't been having bowel movements his symptoms have been getting progressively worse and she presented to emergency department   Regarding pertinent Chronic problems: Had similar admissions in the past for sigmoid volvulus treated with decompression  Patient has been followed by oncology regarding CLL and unit diagnosed marginal zone lymphoma bags available due to her frailty patient is currently not on any chemotherapy and observation management only   IN ER:  Temp (24hrs), Avg:99 F (37.2 C), Min:99 F (37.2 C), Max:99 F (37.2 C)     Heart rate 92 blood pressure 185/86 Sodium 139 bicarbonate 26 BUN 22 creatinine 0.97 albumin 3.9 WBC 11.7 hemoglobin 10.5 platelets 131 Following Medications were ordered in ER: Medications  0.9 %  sodium chloride infusion ( Intravenous New Bag/Given 06/27/16 2125)  ondansetron (ZOFRAN) injection 4 mg (0 mg Intravenous Hold 06/27/16 2125)  fentaNYL (SUBLIMAZE) injection 50 mcg (50 mcg Intravenous Given 06/27/16 2300)  iopamidol (ISOVUE-300) 61 % injection 100 mL (100 mLs Intravenous Contrast Given 06/27/16 2305)    CT scan showed sigmoid bolus results in the large bowel obstruction ER provider discussed case with:  GI who will see patient in consult  tomorrow for now make nothing by mouth  Hospitalist was called for admission for sigmoid volvulus versus large bowel obstruction  Review of Systems:    Pertinent positives include: abdominal pain, nausea, vomiting, diarrhea,   Constitutional:  No weight loss, night sweats, Fevers, chills, fatigue, weight loss  HEENT:  No headaches, Difficulty swallowing,Tooth/dental problems,Sore throat,  No sneezing, itching, ear ache, nasal congestion, post nasal drip,  Cardio-vascular:  No chest pain, Orthopnea, PND, anasarca, dizziness, palpitations.no Bilateral lower extremity swelling  GI:  No heartburn, indigestion, abdominal pain, nausea, vomiting, diarrhea, change in bowel habits, loss of appetite, melena, blood in stool, hematemesis Resp:  no shortness of breath at rest. No dyspnea on exertion, No excess mucus, no productive cough, No non-productive cough, No coughing up of blood.No change in color of mucus.No wheezing. Skin:  no rash or lesions. No jaundice GU:  no dysuria, change in color of urine, no urgency or frequency. No straining to urinate.  No flank pain.  Musculoskeletal:  No joint pain or no joint swelling. No decreased range of motion. No back pain.  Psych:  No change in mood or affect. No depression or anxiety. No memory loss.  Neuro: no localizing neurological complaints, no tingling, no weakness, no double vision, no gait abnormality, no slurred speech, no confusion  As per HPI otherwise 10 point review of systems negative.   Past Medical History: Past Medical History:  Diagnosis Date  . Acid reflux disease   . Cancer [facility])    Looking for documentation of this  .  CLL (chronic lymphocytic leukemia) ([place]) 05/15/2016  . DDD (degenerative disc disease), lumbar   . Degenerative joint disease   . Diabetes mellitus without complication ([place])   . Dyslipidemia   . Hypertension   . Marginal zone lymphoma of axilla ([place]) 05/15/2016  . Panic attacks   . Pneumonia 02/2016  .  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 Stroke [facility]) age 8 yo   poorly controlled DM and Hypertension   Past Surgical History:  Procedure Laterality Date  . ABDOMINAL HYSTERECTOMY  ? 08/25/2011 ?   Has BSO for left benign ovarian tumor with torsion and broad ligament benign tumor, but does not appear had hysterectomy--[facility], NOT ovarian cancer  . CHOLECYSTECTOMY    . FLEXIBLE SIGMOIDOSCOPY N/A 05/19/2016   Procedure: FLEXIBLE SIGMOIDOSCOPY;  Surgeon: [name], MD;  Location: [facility];  Service: Endoscopy;  Laterality: N/A;  . LAPAROTOMY N/A 04/29/2014   Procedure: EXPLORATORY LAPAROTOMY;  Surgeon: Imogene Burn. Georgette Dover, MD;  Location: [place];  Service: General;  Laterality: N/A;  . LYSIS OF ADHESION N/A 04/29/2014   Procedure: EXTENSIVE LYSIS OF ADHESIONS;  Surgeon: Imogene Burn. Georgette Dover, MD;  Location: [place];  Service: General;  Laterality: N/A;  . VENTRAL HERNIA REPAIR N/A 04/29/2014   Procedure: PRIMARY REPAIR OF VENTRAL HERNIA;  Surgeon: Imogene Burn. Georgette Dover, MD;  Location: [place] OR;  Service: General;  Laterality: N/A;     Social History:  Ambulatory  Walker/ wheelchair bound     reports that she quit smoking about 26 years ago. Her smoking use included Cigarettes. She quit smokeless tobacco use about 26 years ago. Her smokeless tobacco use included Snuff. She reports that she does not drink alcohol or use drugs.  Allergies:   Allergies  Allergen Reactions  . Morphine And Related Other (See Comments)    Blood sugar dropped one-time       Family History:   Family History  Problem Relation Age of Onset  . Hypertension Mother   . Stroke Mother   . Arthritis Mother   . Alcohol abuse Father     Medications: Prior to Admission medications   Medication Sig Start Date End Date Taking? Authorizing Provider  aspirin EC 81 MG tablet Take 1 tablet (81 mg total) by mouth daily. 06/05/16   Heath Lark, MD  furosemide (LASIX) 20 MG tablet Take 20 mg by mouth daily  as needed for fluid.    Historical Provider, MD  lisinopril (PRINIVIL,ZESTRIL) 10 MG tablet Take 1 tablet (10 mg total) by mouth daily. Reported on 01/31/2016 06/05/16   Heath Lark, MD  pantoprazole (PROTONIX) 40 MG tablet Take 1 tablet (40 mg total) by mouth daily. 06/05/16   Heath Lark, MD  polyethylene glycol (MIRALAX / GLYCOLAX) packet Take 17 g by mouth 2 (two) times daily. 05/22/16   Charlynne Cousins, MD  prednisoLONE acetate (PRED FORTE) 1 % ophthalmic suspension Place 1-2 drops into both eyes See admin instructions. 2 drops in right eye and 1 drop in the left eye twice daily    Historical Provider, MD  sertraline (ZOLOFT) 25 MG tablet Take 1 tablet (25 mg total) by mouth daily. Reported on 05/19/2016 06/05/16   Heath Lark, MD    Physical Exam: Patient Vitals for the past 24 hrs:  BP Temp Temp src Pulse Resp SpO2 Height Weight  06/27/16 2224 185/86 - - 78 18 96 % - -  06/27/16 2029 158/90 99 F (37.2 C) Oral 92 16 98 %  5\' 4"  (1.626 m) 59 kg (130 lb)    1. General:  in No Acute distress 2. Psychological: Alert and  Oriented 3. Head/ENT:     Dry Mucous Membranes                          Head Non traumatic, neck supple                            Poor Dentition 4. SKIN:  decreased Skin turgor,  Skin clean Dry and intact no rash 5. Heart: Regular rate and rhythm  systolic  Murmur, Rub or gallop 6. Lungs:  no wheezes or crackles   7. Abdomen: Soft,  tender,  distended 8. Lower extremities: no clubbing, cyanosis, or edema 9. Neurologically Grossly intact, moving all 4 extremities equally   10. MSK: Normal range of motion   body mass index is 22.31 kg/m.  Labs on Admission:   Labs on Admission: I have personally reviewed following labs and imaging studies  CBC:  Recent Labs Lab 06/27/16 2133  WBC 11.7*  HGB 10.5*  HCT 32.2*  MCV 86.8  PLT [id number]*   Basic Metabolic Panel:  Recent Labs Lab 06/27/16 2133  NA 139  K 3.9  CL 107  CO2 26  GLUCOSE 161*  BUN 22*  CREATININE  0.97  CALCIUM 9.8   GFR: Estimated Creatinine Clearance: 37.3 mL/min (by C-G formula based on SCr of 0.97 mg/dL). Liver Function Tests:  Recent Labs Lab 06/27/16 2133  AST 19  ALT 10*  ALKPHOS 71  BILITOT 0.5  PROT 7.8  ALBUMIN 3.9    Recent Labs Lab 06/27/16 2133  LIPASE 43   No results for input(s): AMMONIA in the last 168 hours. Coagulation Profile: No results for input(s): INR, PROTIME in the last 168 hours. Cardiac Enzymes: No results for input(s): CKTOTAL, CKMB, CKMBINDEX, TROPONINI in the last 168 hours. BNP (last 3 results) No results for input(s): PROBNP in the last 8760 hours. HbA1C: No results for input(s): HGBA1C in the last 72 hours. CBG: No results for input(s): GLUCAP in the last 168 hours. Lipid Profile: No results for input(s): CHOL, HDL, LDLCALC, TRIG, CHOLHDL, LDLDIRECT in the last 72 hours. Thyroid Function Tests: No results for input(s): TSH, T4TOTAL, FREET4, T3FREE, THYROIDAB in the last 72 hours. Anemia Panel: No results for input(s): VITAMINB12, FOLATE, FERRITIN, TIBC, IRON, RETICCTPCT in the last 72 hours. Urine analysis:    Component Value Date/Time   COLORURINE YELLOW 05/20/2016 0000   APPEARANCEUR CLEAR 05/20/2016 0000   LABSPEC 1.010 05/20/2016 0000   PHURINE 6.0 05/20/2016 0000   GLUCOSEU NEGATIVE 05/20/2016 0000   HGBUR NEGATIVE 05/20/2016 0000   BILIRUBINUR NEGATIVE 05/20/2016 0000   KETONESUR NEGATIVE 05/20/2016 0000   PROTEINUR NEGATIVE 05/20/2016 0000   UROBILINOGEN 0.2 05/17/2015 2307   NITRITE NEGATIVE 05/20/2016 0000   LEUKOCYTESUR SMALL (A) 05/20/2016 0000   Sepsis Labs: @LABRCNTIP (procalcitonin:4,lacticidven:4) )No results found for this or any previous visit (from the past 240 hour(s)).     UA  not ordered  Lab Results  Component Value Date   HGBA1C 5.4 04/25/2014    Estimated Creatinine Clearance: 37.3 mL/min (by C-G formula based on SCr of 0.97 mg/dL).  BNP (last 3 results) No results for input(s): PROBNP  in the last 8760 hours.   ECG REPORT  Not ordered  [facility] Weights   06/27/16 2029  Weight: 59 kg (130 lb)  Cultures:    Component Value Date/Time   SDES SPUTUM 02/15/2016 1143   SDES SPUTUM 02/15/2016 1143   SPECREQUEST NONE 02/15/2016 1143   SPECREQUEST NONE 02/15/2016 1143   CULT  02/15/2016 1143    NORMAL OROPHARYNGEAL FLORA Performed at [facility] 02/15/2016 FINAL 02/15/2016 1143   REPTSTATUS 02/18/2016 FINAL 02/15/2016 1143     Radiological Exams on Admission: Ct Abdomen Pelvis W Contrast  Result Date: 06/27/2016 CLINICAL DATA:  Abdominal pain and diarrhea for 2 days. History of bowel volvulus, small bowel obstruction, ventral hernia repair and lymphoma. EXAM: CT ABDOMEN AND PELVIS WITH CONTRAST TECHNIQUE: Multidetector CT imaging of the abdomen and pelvis was performed using the standard protocol following bolus administration of intravenous contrast. CONTRAST:  160mL ISOVUE-300 IOPAMIDOL (ISOVUE-300) INJECTION 61% COMPARISON:  PET- CT May 22, 2016 and CT abdomen and pelvis May 18, 2015 FINDINGS: LUNG BASES: Elevated RIGHT hemidiaphragm mildly displacing the heart to the LEFT. Heart size is normal. Severe coronary artery calcifications. No pericardial effusion. Lung bases are clear. SOLID ORGANS: The liver demonstrates mild biliary dilatation, status post cholecystectomy. Mild splenomegaly. Pancreas and adrenal glands are unremarkable. GASTROINTESTINAL TRACT: Sigmoid volvulus, with bird's beak appearance and associated swirled mesentery. Proximal colon is distended to 8.2 cm with large amount of stool and stool air-fluid levels. Distended rectum with air-fluid level. Enteric contrast in the small bowel KIDNEYS/ URINARY TRACT: Kidneys are orthotopic, demonstrating symmetric enhancement. Punctate LEFT upper pole nephrolithiasis. No hydronephrosis or solid renal masses. The unopacified ureters are normal in course and caliber. Delayed imaging through the  kidneys demonstrates symmetric prompt contrast excretion within the proximal urinary collecting system. Urinary bladder is partially distended and unremarkable. PERITONEUM/RETROPERITONEUM: Tortuous abdominal aorta, normal in caliber with severe calcific atherosclerosis. No lymphadenopathy by CT size criteria. Internal reproductive organs are unremarkable. Small amount of ascites is likely reactive. No pneumoperitoneum. SOFT TISSUE/OSSEOUS STRUCTURES: Non-suspicious. Sub cm inguinal lymph nodes decreased in size from prior CT. Moderate sacroiliac osteoarthrosis. Severe degenerative change of the lumbar spine. Anterior abdominal wall scarring. IMPRESSION: Sigmoid volvulus resulting in large bowel obstruction. Splenomegaly, in keeping with patient's history of lymphoma. Severe atherosclerosis. Acute findings discussed with and reconfirmed by Dr.ANTHONY ALLEN on 06/27/2016 at 11:35 pm. Electronically Signed   By: Elon Alas M.D.   On: 06/27/2016 23:38    Chart has been reviewed    Assessment/Plan  82 y.o. female with medical history significant of recurrent sigmoid volvulus, DM, HTN, CLL, non-Hodgkin B cell lymphoma of axilla, CVA at age of 79 being admitted for recurrent sigmoid volvulus GI consulted  Present on Admission: . Abdominal pain secondary to sigmoid volvulus well-controlled with IV pain medication  . Anemia, unspecified chronic continue to monitor denies any blood per rectum . CLL (chronic lymphocytic leukemia) ([facility]) chronic followed by oncology   . Essential hypertension nothing by mouth we'll give labetalol when necessary . Protein-calorie malnutrition, severe ([place]) once able to tolerate by mouth nutritional consult  would be beneficial . Sigmoid volvulus ([place]) as per GI for now to keep nothing by mouth . Thrombocytopenia ([place]) chronic stable   Other plan as per orders.  DVT prophylaxis:  SCD       Code Status:  FULL CODE  as per patient    Family Communication:   Family   at  Bedside  plan of care was discussed with  Daughter Chong Sicilian [phone] prefered tyquasia belyea [phone]  Disposition Plan:     To home once workup is complete  and patient is stable                           Consults called: GI Dr.stark  Admission status:   inpatient       Level of care    medical floor        I have spent a total of  57 min on this admission    Liliauna Santoni 06/28/2016, 12:22 AM    [facility]  Pager [phone]   after 2 AM please page floor coverage PA If 7AM-7PM, please contact the day team taking care of the patient  [email or web address]  Password [email or web address]

## 2016-06-28 NOTE — H&P (View-Only) (Signed)
Consult Note   Referring Provider: Dr. Irven Easterly Primary Care Physician:  Philis Fendt, MD Primary Gastroenterologist:  none/unassigned  Reason for Consultation:  Abdominal pain, sigmoid volvulus   HPI: Erika Day is a 82 y.o. female with chronic constipation and history of a sigmoid volvulus last month decompressed with flex sigmoidoscopy. Pt was evaluated by [facility] and decision was made not to pursue surgery due to higher operative risk. CT reviewed. 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 Denies diarrhea, change in stool caliber, melena, hematochezia, nausea, vomiting, dysphagia, reflux symptoms, chest pain.   Past Medical History:  Diagnosis Date  . Acid reflux disease   . Cancer [facility])    Looking for documentation of this  . CLL (chronic lymphocytic leukemia) ([place]) 05/15/2016  . DDD (degenerative disc disease), lumbar   . Degenerative joint disease   . Diabetes mellitus without complication ([place])   . Dyslipidemia   . Hypertension   . 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 Panic attacks   . Pneumonia 02/2016  . 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 Stroke [facility]) age 59 yo   poorly controlled DM and Hypertension    Past Surgical History:  Procedure Laterality Date  . ABDOMINAL HYSTERECTOMY  ? 08/25/2011 ?   Has BSO for left benign ovarian tumor with torsion and broad ligament benign tumor, but does not appear had hysterectomy--[facility], NOT ovarian cancer  . CHOLECYSTECTOMY    . FLEXIBLE SIGMOIDOSCOPY N/A 05/19/2016   Procedure: FLEXIBLE SIGMOIDOSCOPY;  Surgeon: [name], MD;  Location: [facility];  Service: Endoscopy;  Laterality: N/A;  . LAPAROTOMY N/A 04/29/2014   Procedure: EXPLORATORY LAPAROTOMY;  Surgeon: Imogene Burn. Georgette Dover, MD;  Location: [place];  Service: General;  Laterality: N/A;  . LYSIS OF ADHESION N/A 04/29/2014   Procedure: EXTENSIVE LYSIS OF ADHESIONS;  Surgeon: Imogene Burn. Georgette Dover, MD;  Location: [place];   Service: General;  Laterality: N/A;  . VENTRAL HERNIA REPAIR N/A 04/29/2014   Procedure: PRIMARY REPAIR OF VENTRAL HERNIA;  Surgeon: Imogene Burn. Georgette Dover, MD;  Location: [place];  Service: General;  Laterality: N/A;    Prior to Admission medications   Medication Sig Start Date End Date Taking? Authorizing Provider  aspirin EC 81 MG tablet Take 1 tablet (81 mg total) by mouth daily. 06/05/16  Yes Heath Lark, MD  furosemide (LASIX) 20 MG tablet Take 20 mg by mouth daily as needed for fluid.   Yes Historical Provider, MD  lisinopril (PRINIVIL,ZESTRIL) 10 MG tablet Take 1 tablet (10 mg total) by mouth daily. Reported on 01/31/2016 06/05/16  Yes Heath Lark, MD  Loratadine (CLARITIN PO) Take 10 mg by mouth.   Yes Historical Provider, MD  pantoprazole (PROTONIX) 40 MG tablet Take 1 tablet (40 mg total) by mouth daily. 06/05/16  Yes Heath Lark, MD  polyethylene glycol powder (GLYCOLAX/MIRALAX) powder Take 17 g by mouth 3 (three) times daily. 06/16/16  Yes Historical Provider, MD  prednisoLONE acetate (PRED FORTE) 1 % ophthalmic suspension Place 1-2 drops into both eyes See admin instructions. 2 drops in right eye and 1 drop in the left eye twice daily   Yes Historical Provider, MD  sertraline (ZOLOFT) 25 MG tablet Take 1 tablet (25 mg total) by mouth daily. Reported on 05/19/2016 06/05/16  Yes Heath Lark, MD  polyethylene glycol (MIRALAX / GLYCOLAX) packet Take 17 g by mouth 2 (two) times daily. Patient not taking: Reported on 06/28/2016 05/22/16  Charlynne Cousins, MD    Current Facility-Administered Medications  Medication Dose Route Frequency Provider Last Rate Last Dose  . 0.9 %  sodium chloride infusion   Intravenous Continuous Lacretia Leigh, MD 125 mL/hr at 06/27/16 2125    . 0.9 %  sodium chloride infusion   Intravenous Continuous Toy Baker, MD 100 mL/hr at 06/28/16 0203    . fentaNYL (SUBLIMAZE) injection 50 mcg  50 mcg Intravenous Q2H PRN Toy Baker, MD      . labetalol (NORMODYNE,TRANDATE)  injection 10 mg  10 mg Intravenous Q2H PRN Toy Baker, MD      . ondansetron (ZOFRAN) tablet 4 mg  4 mg Oral Q6H PRN Toy Baker, MD       Or  . ondansetron (ZOFRAN) injection 4 mg  4 mg Intravenous Q6H PRN Toy Baker, MD      . prednisoLONE acetate (PRED FORTE) 1 % ophthalmic suspension 2 drop  2 drop Right Eye BID Toy Baker, MD       And  . prednisoLONE acetate (PRED FORTE) 1 % ophthalmic suspension 1 drop  1 drop Left Eye BID Toy Baker, MD      . sodium chloride flush (NS) 0.9 % injection 3 mL  3 mL Intravenous Q12H Toy Baker, MD   3 mL at 06/28/16 0203    Allergies as of 06/27/2016 - Review Complete 06/27/2016  Allergen Reaction Noted  . Morphine and related Other (See Comments) 04/23/2014    Family History  Problem Relation Age of Onset  . Hypertension Mother   . Stroke Mother   . Arthritis Mother   . Alcohol abuse Father     Social History   Social History  . Marital status: Married    Spouse name: Yuli Beem  . Number of children: 0  . Years of education: N/A   Occupational History  . Retired housewife    Social History Main Topics  . Smoking status: Former Smoker    Types: Cigarettes    Quit date: 02/18/1990  . Smokeless tobacco: Former Systems developer    Types: Snuff    Quit date: 02/18/1990  . Alcohol use No     Comment: Only drank alcohol between ages of 88 and 38 yo.  Her father gave it to her  . Drug use: No  . Sexual activity: No   Other Topics Concern  . Not on file   Social History Narrative   Lives at home with her husband, [name], her "adopted daugher, [name]", and [name]'s little 80 yo boy.   Has been in a wheelchair because she kept falling--in wheelchair for about 10 years.   Sometimes gets up and holds onto furniture or wall to get around.     Did have a walker, but fell with that.  At one point had PT   Does not have any biologic children, but has cared for other family members' children through  childhood   [name] is the granddaughter of her 44rd cousin.  She raised [name]'s mother, [name] as well.     55 yo son of [name] also lives in their home.     He is in daycare and his great grandmother watches him when she is at work.   Has brought up about 7 children over past 50 years.       Review of Systems: Gen: Denies any fever, chills, sweats, anorexia, fatigue, weakness, malaise, weight loss, and sleep disorder CV: Denies chest pain, angina, palpitations, syncope, orthopnea, PND, peripheral  edema, and claudication. Resp: Denies dyspnea at rest, dyspnea with exercise, cough, sputum, wheezing, coughing up blood, and pleurisy. GI: Denies vomiting blood, jaundice, and fecal incontinence.   Denies dysphagia or odynophagia. GU : Denies urinary burning, blood in urine, urinary frequency, urinary hesitancy, nocturnal urination, and urinary incontinence. MS: Denies joint pain, limitation of movement, and swelling, stiffness, low back pain, extremity pain. Denies muscle weakness, cramps, atrophy.  Derm: Denies rash, itching, dry skin, hives, moles, warts, or unhealing ulcers.  Psych: Denies depression, anxiety, memory loss, suicidal ideation, hallucinations, paranoia, and confusion. Heme: Denies bruising, bleeding, and enlarged lymph nodes. Neuro:  Denies any headaches, dizziness, paresthesias. Endo:  Denies any problems with DM, thyroid, adrenal function.  Physical Exam: Vital signs in last 24 hours: Temp:  [98 F (36.7 C)-99 F (37.2 C)] 98.1 F (36.7 C) (08/27 0552) Pulse Rate:  [64-92] 64 (08/27 0552) Resp:  [16-18] 16 (08/27 0552) BP: (140-185)/(69-92) 140/69 (08/27 0552) SpO2:  [95 %-100 %] 97 % (08/27 0552) Weight:  [130 lb (59 kg)] 130 lb (59 kg) (08/26 2029)    General:  Alert, well-developed, well-nourished, elderly, uncomfortable Head:  Normocephalic and atraumatic. Eyes:  Sclera clear, no icterus. Conjunctiva pink. Arcus senilis ou. Ears:  Normal auditory  acuity. Nose:  No deformity, discharge, or lesions. Mouth:  No deformity or lesions. Oropharynx pink & moist. Neck:  Supple; no masses or thyromegaly. Chest:  Clear throughout to auscultation. No wheezes, crackles, or rhonchi. No acute distress. Heart:  Regular rate and rhythm; 3/6 systolic murmur, No clicks, rubs, or gallops. Abdomen:  Soft, diffusely tender and nondistended. No masses, hepatosplenomegaly or hernias noted. Tinkling, hyperactive bowel sounds, without guarding, and without rebound.  Rectal: deferred to flex sig   Msk:  Symmetrical without gross deformities. Normal posture. Pulses:  Normal pulses noted. Extremities:  Without clubbing or edema. Neurologic:  Alert and  oriented x4;  grossly normal neurologically. Skin:  Intact without significant lesions or rashes. Cervical Nodes:  No significant cervical adenopathy. Psych:  Alert and cooperative. Normal mood and affect.  Intake/Output from previous day: 08/26 0701 - 08/27 0700 In: 500 [I.V.:500] Out: -  Intake/Output this shift: Total I/O In: 500 [I.V.:500] Out: -   Lab Results:  Recent Labs  06/27/16 2133 06/28/16 0416  WBC 11.7* 9.4  HGB 10.5* 9.6*  HCT 32.2* 29.4*  PLT 131* 108*   BMET  Recent Labs  06/27/16 2133 06/28/16 0416  NA 139 138  K 3.9 3.3*  CL 107 110  CO2 26 24  GLUCOSE 161* 96  BUN 22* 18  CREATININE 0.97 0.87  CALCIUM 9.8 9.2   LFT  Recent Labs  06/28/16 0416  PROT 6.8  ALBUMIN 3.3*  AST 16  ALT 11*  ALKPHOS 62  BILITOT 0.6    Studies/Results: Ct Abdomen Pelvis W Contrast  Result Date: 06/27/2016 CLINICAL DATA:  Abdominal pain and diarrhea for 2 days. History of bowel volvulus, small bowel obstruction, ventral hernia repair and lymphoma. EXAM: CT ABDOMEN AND PELVIS WITH CONTRAST TECHNIQUE: Multidetector CT imaging of the abdomen and pelvis was performed using the standard protocol following bolus administration of intravenous contrast. CONTRAST:  152mL ISOVUE-300  IOPAMIDOL (ISOVUE-300) INJECTION 61% COMPARISON:  PET- CT May 22, 2016 and CT abdomen and pelvis May 18, 2015 FINDINGS: LUNG BASES: Elevated RIGHT hemidiaphragm mildly displacing the heart to the LEFT. Heart size is normal. Severe coronary artery calcifications. No pericardial effusion. Lung bases are clear. SOLID ORGANS: The liver demonstrates mild biliary dilatation, status  post cholecystectomy. Mild splenomegaly. Pancreas and adrenal glands are unremarkable. GASTROINTESTINAL TRACT: Sigmoid volvulus, with bird's beak appearance and associated swirled mesentery. Proximal colon is distended to 8.2 cm with large amount of stool and stool air-fluid levels. Distended rectum with air-fluid level. Enteric contrast in the small bowel KIDNEYS/ URINARY TRACT: Kidneys are orthotopic, demonstrating symmetric enhancement. Punctate LEFT upper pole nephrolithiasis. No hydronephrosis or solid renal masses. The unopacified ureters are normal in course and caliber. Delayed imaging through the kidneys demonstrates symmetric prompt contrast excretion within the proximal urinary collecting system. Urinary bladder is partially distended and unremarkable. PERITONEUM/RETROPERITONEUM: Tortuous abdominal aorta, normal in caliber with severe calcific atherosclerosis. No lymphadenopathy by CT size criteria. Internal reproductive organs are unremarkable. Small amount of ascites is likely reactive. No pneumoperitoneum. SOFT TISSUE/OSSEOUS STRUCTURES: Non-suspicious. Sub cm inguinal lymph nodes decreased in size from prior CT. Moderate sacroiliac osteoarthrosis. Severe degenerative change of the lumbar spine. Anterior abdominal wall scarring. IMPRESSION: Sigmoid volvulus resulting in large bowel obstruction. Splenomegaly, in keeping with patient's history of lymphoma. Severe atherosclerosis. Acute findings discussed with and reconfirmed by Dr.ANTHONY ALLEN on 06/27/2016 at 11:35 pm. Electronically Signed   By: Elon Alas M.D.   On:  06/27/2016 23:38    Impression/ Recommendations:  1. 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 Flex sigmoidoscopy today to attempt to decompress. Surgical consult to reconsider sigmoid colectomy or pexy. Correct hypokalemia.    LOS: 0 days   Karisa Nesser T. Fuller Plan MD 06/28/2016, 6:45 AM [id number] Mon-Fri 8a-5p  [id number] after 5p, weekends, holidays

## 2016-06-28 NOTE — Op Note (Signed)
[facility] Patient Name: Erika Day Procedure Date: 06/28/2016 MRN: [id number] Attending MD: Ladene Artist , MD Date of Birth: 1930/09/11 CSN: [id number] Age: 82 Admit Type: Inpatient Procedure:                Flexible Sigmoidoscopy Indications:              Volvulus Providers:                Pricilla Riffle. Fuller Plan, MD, Kingsley Plan, RN, Ralene Bathe, Technician Referring MD:             [facility] Medicines:                Fentanyl 25 micrograms IV, Midazolam 2 mg IV Complications:            No immediate complications. Estimated Blood Loss:     Estimated blood loss: none. Procedure:                Pre-Anesthesia Assessment:                           - Prior to the procedure, a History and Physical                            was performed, and patient medications and                            allergies were reviewed. The patient's tolerance of                            previous anesthesia was also reviewed. The risks                            and benefits of the procedure and the sedation                            options and risks were discussed with the patient.                            All questions were answered, and informed consent                            was obtained. Prior Anticoagulants: The patient has                            taken no previous anticoagulant or antiplatelet                            agents. ASA Grade Assessment: III - A patient with                            severe systemic disease. After reviewing the risks  and benefits, the patient was deemed in                            satisfactory condition to undergo the procedure.                           After obtaining informed consent, the scope was                            passed under direct vision. The [id number]                            scope was introduced through the anus and advanced           to the the splenic flexure. The flexible                            sigmoidoscopy was accomplished without difficulty.                            The patient tolerated the procedure well. The                            quality of the bowel preparation was poor,                            unprepped procedure. Scope In: Scope Out: Findings:      The perianal and digital rectal examinations were normal. Retroflexed       view of the rectum was unremarkable.      A volvulus twist was noted at 25 cm, with very mild segmental ischemia       proximal to this area, in the sigmoid colon. The colon was dilated       proximal to this area with air and liquid stool which was suctioned and       decompressed. Decompression of the volvulus was successful, with       complete decompression achieved. The mucosa otherwise was unremarkable       in the rectum and descending colon. Impression:               - Preparation of the colon was poor.                           - Volvulus. Successful complete decompression                            achieved.                           - Very mild ischemic changes noted                           - No specimens collected. Moderate Sedation:      Moderate (conscious) sedation was administered by the endoscopy nurse       and supervised by the endoscopist. The following parameters were       monitored: oxygen saturation, heart rate, blood  pressure, respiratory       rate, EKG, adequacy of pulmonary ventilation, and response to care.       Total physician intraservice time was 11 minutes. Recommendation:           - Return patient to hospital ward for ongoing care.                           - Refer to a surgeon for consideration of sigmoid                            colectomy, pexy. Procedure Code(s):        --- Professional ---                           [phone], Sigmoidoscopy, flexible; with decompression                            (for pathologic distention)  (eg, volvulus,                            megacolon), including placement of decompression                            tube, when performed                           G0500, Moderate sedation services provided by the                            same physician or other qualified health care                            professional performing a gastrointestinal                            endoscopic service that sedation supports,                            requiring the presence of an independent trained                            observer to assist in the monitoring of the                            patient's level of consciousness and physiological                            status; initial 15 minutes of intra-service time;                            patient age 32 years or older (additional time may                            be reported with [phone], as appropriate) Diagnosis Code(s):        --- Professional ---  K56.2, Volvulus CPT copyright 2016 [employer]. All rights reserved. The codes documented in this report are preliminary and upon coder review may  be revised to meet current compliance requirements. Ladene Artist, MD 06/28/2016 7:56:15 AM This report has been signed electronically. Number of Addenda: 0

## 2016-06-28 NOTE — Interval H&P Note (Signed)
History and Physical Interval Note:  06/28/2016 7:33 AM  Erika Day  has presented today for surgery, with the diagnosis of sigmoid volvulus  The various methods of treatment have been discussed with the patient and family. After consideration of risks, benefits and other options for treatment, the patient has consented to  Procedure(s): FLEXIBLE SIGMOIDOSCOPY (N/A) as a surgical intervention .  The patient's history has been reviewed, patient examined, no change in status, stable for surgery.  I have reviewed the patient's chart and labs.  Questions were answered to the patient's satisfaction.     Pricilla Riffle. Fuller Plan

## 2016-06-28 NOTE — Progress Notes (Addendum)
PROGRESS NOTE    Erika Day  [id number] DOB: 21-Oct-1930 DOA: 06/27/2016  PCP: Philis Fendt, MD   Brief Narrative:  82 y/o with h/o constipation and sigmoid volvulus (decompressed last month) , DM, HTN, non-Hodgkin B cell lymphoma of axilla, CVA GERD who presents for abdominal pain and is found to have a recurrent sigmoid volvulus.   Subjective: No longer having abdominal pain. No nausea. No BM yet.   Assessment & Plan:   Principal Problem:   Sigmoid volvulus  - decompressed once again via flex sig per GI - will consult surgery to determine if sigmoid colectomy is now recommended  Active Problems:   Essential hypertension - hold Lasix and Lisinopril for today- follow BP    Anemia, unspecified - chronic issue- stable  Non-Hodgkin B cell lymphoma - being followed by Dr Alvy Bimler, currently not on treatment     Thrombocytopenia - chronic issue- stable    Protein-calorie malnutrition, severe  - start supplements once we have determined that she is tolerating a diet   DVT prophylaxis: Lovenox Code Status: Full code Family Communication:  Disposition Plan: home vs SNF when stable GI Gens surgery Procedures:   Flex Sig Antimicrobials:  Anti-infectives    None       Objective: Vitals:   06/28/16 0750 06/28/16 0800 06/28/16 0805 06/28/16 0929  BP: (!) 105/40 (!) 100/41 (!) 119/51 131/60  Pulse: (!) 59 (!) 59 (!) 59 (!) 58  Resp: 15 15 18 14   Temp:    98 F (36.7 C)  TempSrc:    Oral  SpO2: 99% 97% 95% 100%  Weight:      Height:        Intake/Output Summary (Last 24 hours) at 06/28/16 1329 Last data filed at 06/28/16 0600  Gross per 24 hour  Intake              500 ml  Output                0 ml  Net              500 ml   Filed Weights   06/27/16 2029  Weight: 59 kg (130 lb)    Examination: General exam: Appears comfortable  HEENT: PERRLA, oral mucosa moist, no sclera icterus or thrush Respiratory system: Clear to auscultation.  Respiratory effort normal. Cardiovascular system: S1 & S2 heard, RRR.  No murmurs  Gastrointestinal system: Abdomen soft, non-tender, nondistended. Normal bowel sound. No organomegaly Central nervous system: Alert and oriented. No focal neurological deficits. Extremities: No cyanosis, clubbing or edema Skin: No rashes or ulcers Psychiatry:  Mood & affect appropriate.     Data Reviewed: I have personally reviewed following labs and imaging studies  CBC:  Recent Labs Lab 06/27/16 2133 06/28/16 0416  WBC 11.7* 9.4  HGB 10.5* 9.6*  HCT 32.2* 29.4*  MCV 86.8 85.0  PLT 131* 123XX123*   Basic Metabolic Panel:  Recent Labs Lab 06/27/16 2133 06/28/16 0416  NA 139 138  K 3.9 3.3*  CL 107 110  CO2 26 24  GLUCOSE 161* 96  BUN 22* 18  CREATININE 0.97 0.87  CALCIUM 9.8 9.2  MG  --  1.8  PHOS  --  3.0   GFR: Estimated Creatinine Clearance: 41.6 mL/min (by C-G formula based on SCr of 0.87 mg/dL). Liver Function Tests:  Recent Labs Lab 06/27/16 2133 06/28/16 0416  AST 19 16  ALT 10* 11*  ALKPHOS 71 62  BILITOT 0.5 0.6  PROT 7.8 6.8  ALBUMIN 3.9 3.3*    Recent Labs Lab 06/27/16 2133  LIPASE 43   No results for input(s): AMMONIA in the last 168 hours. Coagulation Profile: No results for input(s): INR, PROTIME in the last 168 hours. Cardiac Enzymes: No results for input(s): CKTOTAL, CKMB, CKMBINDEX, TROPONINI in the last 168 hours. BNP (last 3 results) No results for input(s): PROBNP in the last 8760 hours. HbA1C: No results for input(s): HGBA1C in the last 72 hours. CBG:  Recent Labs Lab 06/28/16 0829 06/28/16 1151  GLUCAP 94 79   Lipid Profile: No results for input(s): CHOL, HDL, LDLCALC, TRIG, CHOLHDL, LDLDIRECT in the last 72 hours. Thyroid Function Tests:  Recent Labs  06/28/16 0416  TSH 2.318   Anemia Panel: No results for input(s): VITAMINB12, FOLATE, FERRITIN, TIBC, IRON, RETICCTPCT in the last 72 hours. Urine analysis:    Component Value  Date/Time   COLORURINE YELLOW 06/28/2016 0652   APPEARANCEUR CLEAR 06/28/2016 0652   LABSPEC 1.044 (H) 06/28/2016 0652   PHURINE 6.0 06/28/2016 0652   GLUCOSEU NEGATIVE 06/28/2016 0652   HGBUR NEGATIVE 06/28/2016 0652   BILIRUBINUR NEGATIVE 06/28/2016 0652   KETONESUR NEGATIVE 06/28/2016 0652   PROTEINUR NEGATIVE 06/28/2016 0652   UROBILINOGEN 0.2 05/17/2015 2307   NITRITE NEGATIVE 06/28/2016 0652   LEUKOCYTESUR NEGATIVE 06/28/2016 0652   Sepsis Labs: @LABRCNTIP (procalcitonin:4,lacticidven:4) )No results found for this or any previous visit (from the past 240 hour(s)).       Radiology Studies: Ct Abdomen Pelvis W Contrast  Result Date: 06/27/2016 CLINICAL DATA:  Abdominal pain and diarrhea for 2 days. History of bowel volvulus, small bowel obstruction, ventral hernia repair and lymphoma. EXAM: CT ABDOMEN AND PELVIS WITH CONTRAST TECHNIQUE: Multidetector CT imaging of the abdomen and pelvis was performed using the standard protocol following bolus administration of intravenous contrast. CONTRAST:  167mL ISOVUE-300 IOPAMIDOL (ISOVUE-300) INJECTION 61% COMPARISON:  PET- CT May 22, 2016 and CT abdomen and pelvis May 18, 2015 FINDINGS: LUNG BASES: Elevated RIGHT hemidiaphragm mildly displacing the heart to the LEFT. Heart size is normal. Severe coronary artery calcifications. No pericardial effusion. Lung bases are clear. SOLID ORGANS: The liver demonstrates mild biliary dilatation, status post cholecystectomy. Mild splenomegaly. Pancreas and adrenal glands are unremarkable. GASTROINTESTINAL TRACT: Sigmoid volvulus, with bird's beak appearance and associated swirled mesentery. Proximal colon is distended to 8.2 cm with large amount of stool and stool air-fluid levels. Distended rectum with air-fluid level. Enteric contrast in the small bowel KIDNEYS/ URINARY TRACT: Kidneys are orthotopic, demonstrating symmetric enhancement. Punctate LEFT upper pole nephrolithiasis. No hydronephrosis or solid  renal masses. The unopacified ureters are normal in course and caliber. Delayed imaging through the kidneys demonstrates symmetric prompt contrast excretion within the proximal urinary collecting system. Urinary bladder is partially distended and unremarkable. PERITONEUM/RETROPERITONEUM: Tortuous abdominal aorta, normal in caliber with severe calcific atherosclerosis. No lymphadenopathy by CT size criteria. Internal reproductive organs are unremarkable. Small amount of ascites is likely reactive. No pneumoperitoneum. SOFT TISSUE/OSSEOUS STRUCTURES: Non-suspicious. Sub cm inguinal lymph nodes decreased in size from prior CT. Moderate sacroiliac osteoarthrosis. Severe degenerative change of the lumbar spine. Anterior abdominal wall scarring. IMPRESSION: Sigmoid volvulus resulting in large bowel obstruction. Splenomegaly, in keeping with patient's history of lymphoma. Severe atherosclerosis. Acute findings discussed with and reconfirmed by Dr.ANTHONY ALLEN on 06/27/2016 at 11:35 pm. Electronically Signed   By: Elon Alas M.D.   On: 06/27/2016 23:38      Scheduled Meds: . pantoprazole  40 mg Oral Daily  . prednisoLONE acetate  2 drop Right Eye BID   And  . prednisoLONE acetate  1 drop Left Eye BID  . sertraline  25 mg Oral Daily  . sodium chloride flush  3 mL Intravenous Q12H   Continuous Infusions: . sodium chloride 125 mL/hr at 06/28/16 1316     LOS: 0 days    Time spent in minutes: 19    [place], MD [facility] Pager: [email or web address] Password [email or web address] 06/28/2016, 1:29 PM

## 2016-06-28 NOTE — Clinical Social Work Note (Signed)
CSW received consult for medication assistance. Please consult care management. CSW will sign off, but can be reconsulted if needed.  Benay Pike, [place]

## 2016-06-28 NOTE — ED Notes (Signed)
Report given to Al, RN.  

## 2016-06-28 NOTE — Consult Note (Signed)
CCS Consult Note   Referring Provider: Dr. Irven Easterly Primary Care Physician:  Philis Fendt, MD Gastroenterologist:  Dr Fuller Plan  Reason for Consultation:  Recurrent sigmoid volvulus   HPI: Erika Day is a 82 y.o. female with chronic constipation and history of a sigmoid volvulus last month decompressed with flex sigmoidoscopy. Pt was evaluated by Dr Excell Seltzer and decision was made not to pursue surgery due to higher operative risk. CT reviewed. Pt followed by Dr. Alvy Bimler with lymphoma.    Past Medical History:  Diagnosis Date  . Acid reflux disease   . Cancer [facility])    Looking for documentation of this  . CLL (chronic lymphocytic leukemia) ([place]) 05/15/2016  . DDD (degenerative disc disease), lumbar   . Degenerative joint disease   . Diabetes mellitus without complication ([place])   . Dyslipidemia   . Hypertension   . 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 Panic attacks   . Pneumonia 02/2016  . 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 Stroke [facility]) age 19 yo   poorly controlled DM and Hypertension    Past Surgical History:  Procedure Laterality Date  . ABDOMINAL HYSTERECTOMY  ? 08/25/2011 ?   Has BSO for left benign ovarian tumor with torsion and broad ligament benign tumor, but does not appear had hysterectomy--[facility], NOT ovarian cancer  . CHOLECYSTECTOMY    . FLEXIBLE SIGMOIDOSCOPY N/A 05/19/2016   Procedure: FLEXIBLE SIGMOIDOSCOPY;  Surgeon: [name], MD;  Location: [facility];  Service: Endoscopy;  Laterality: N/A;  . LAPAROTOMY N/A 04/29/2014   Procedure: EXPLORATORY LAPAROTOMY;  Surgeon: Imogene Burn. Georgette Dover, MD;  Location: [place];  Service: General;  Laterality: N/A;  . LYSIS OF ADHESION N/A 04/29/2014   Procedure: EXTENSIVE LYSIS OF ADHESIONS;  Surgeon: Imogene Burn. Georgette Dover, MD;  Location: [place];  Service: General;  Laterality: N/A;  . VENTRAL HERNIA REPAIR N/A 04/29/2014   Procedure: PRIMARY REPAIR OF VENTRAL HERNIA;  Surgeon:  Imogene Burn. Georgette Dover, MD;  Location: [place];  Service: General;  Laterality: N/A;    Prior to Admission medications   Medication Sig Start Date End Date Taking? Authorizing Provider  aspirin EC 81 MG tablet Take 1 tablet (81 mg total) by mouth daily. 06/05/16  Yes Heath Lark, MD  furosemide (LASIX) 20 MG tablet Take 20 mg by mouth daily as needed for fluid.   Yes Historical Provider, MD  lisinopril (PRINIVIL,ZESTRIL) 10 MG tablet Take 1 tablet (10 mg total) by mouth daily. Reported on 01/31/2016 06/05/16  Yes Heath Lark, MD  Loratadine (CLARITIN PO) Take 10 mg by mouth.   Yes Historical Provider, MD  pantoprazole (PROTONIX) 40 MG tablet Take 1 tablet (40 mg total) by mouth daily. 06/05/16  Yes Heath Lark, MD  polyethylene glycol powder (GLYCOLAX/MIRALAX) powder Take 17 g by mouth 3 (three) times daily. 06/16/16  Yes Historical Provider, MD  prednisoLONE acetate (PRED FORTE) 1 % ophthalmic suspension Place 1-2 drops into both eyes See admin instructions. 2 drops in right eye and 1 drop in the left eye twice daily   Yes Historical Provider, MD  sertraline (ZOLOFT) 25 MG tablet Take 1 tablet (25 mg total) by mouth daily. Reported on 05/19/2016 06/05/16  Yes Heath Lark, MD  polyethylene glycol (MIRALAX / GLYCOLAX) packet Take 17 g by mouth 2 (two) times daily. Patient not taking: Reported on 06/28/2016 05/22/16   Charlynne Cousins, MD    Current Facility-Administered Medications  Medication Dose Route Frequency  Provider Last Rate Last Dose  . 0.9 %  sodium chloride infusion   Intravenous Continuous Lacretia Leigh, MD 125 mL/hr at 06/27/16 2125    . fentaNYL (SUBLIMAZE) injection 50 mcg  50 mcg Intravenous Q2H PRN Toy Baker, MD      . labetalol (NORMODYNE,TRANDATE) injection 10 mg  10 mg Intravenous Q2H PRN Toy Baker, MD      . ondansetron (ZOFRAN) tablet 4 mg  4 mg Oral Q6H PRN Toy Baker, MD       Or  . ondansetron (ZOFRAN) injection 4 mg  4 mg Intravenous Q6H PRN Toy Baker, MD       . pantoprazole (PROTONIX) EC tablet 40 mg  40 mg Oral Daily Debbe Odea, MD   40 mg at 06/28/16 0956  . potassium chloride 10 mEq in 100 mL IVPB  10 mEq Intravenous Q1 Hr x 4 Saima Rizwan, MD   10 mEq at 06/28/16 0945  . prednisoLONE acetate (PRED FORTE) 1 % ophthalmic suspension 2 drop  2 drop Right Eye BID Debbe Odea, MD       And  . prednisoLONE acetate (PRED FORTE) 1 % ophthalmic suspension 1 drop  1 drop Left Eye BID Debbe Odea, MD      . sertraline (ZOLOFT) tablet 25 mg  25 mg Oral Daily Debbe Odea, MD   25 mg at 06/28/16 0943  . sodium chloride flush (NS) 0.9 % injection 3 mL  3 mL Intravenous Q12H Toy Baker, MD   3 mL at 06/28/16 1000    Allergies as of 06/27/2016 - Review Complete 06/27/2016  Allergen Reaction Noted  . Morphine and related Other (See Comments) 04/23/2014    Family History  Problem Relation Age of Onset  . Hypertension Mother   . Stroke Mother   . Arthritis Mother   . Alcohol abuse Father     Social History   Social History  . Marital status: Married    Spouse name: Lakeyda Bazzle  . Number of children: 0  . Years of education: N/A   Occupational History  . Retired housewife    Social History Main Topics  . Smoking status: Former Smoker    Types: Cigarettes    Quit date: 02/18/1990  . Smokeless tobacco: Former Systems developer    Types: Snuff    Quit date: 02/18/1990  . Alcohol use No     Comment: Only drank alcohol between ages of 18 and 26 yo.  Her father gave it to her  . Drug use: No  . Sexual activity: No   Other Topics Concern  . Not on file   Social History Narrative   Lives at home with her husband, [name], her "adopted daugher, [name]", and [name]'s little 80 yo boy.   Has been in a wheelchair because she kept falling--in wheelchair for about 10 years.   Sometimes gets up and holds onto furniture or wall to get around.     Did have a walker, but fell with that.  At one point had PT   Does not have any biologic children,  but has cared for other family members' children through childhood   [name] is the granddaughter of her 54rd cousin.  She raised [name]'s mother, [name] as well.     30 yo son of [name] also lives in their home.     He is in daycare and his great grandmother watches him when she is at work.   Has brought up about 7 children over past  50 years.       Review of Systems: Gen: Denies any fever, chills, sweats, anorexia, fatigue, weakness, malaise, weight loss, and sleep disorder CV: Denies chest pain, angina, palpitations, syncope, orthopnea, PND, peripheral edema, and claudication. Resp: Denies dyspnea at rest, dyspnea with exercise, cough, sputum, wheezing, coughing up blood, and pleurisy. GI: Denies vomiting blood, jaundice, and fecal incontinence.   Denies dysphagia or odynophagia. GU : Denies urinary burning, blood in urine, urinary frequency, urinary hesitancy, nocturnal urination, and urinary incontinence. MS: Denies joint pain, limitation of movement, and swelling, stiffness, low back pain, extremity pain. Denies muscle weakness, cramps, atrophy.  Derm: Denies rash, itching, dry skin, hives, moles, warts, or unhealing ulcers.  Psych: Denies depression, anxiety, memory loss, suicidal ideation, hallucinations, paranoia, and confusion. Heme: Denies bruising, bleeding, and enlarged lymph nodes. Neuro:  Denies any headaches, dizziness, paresthesias. Endo:  Denies any problems with DM, thyroid, adrenal function.  Physical Exam: Vital signs in last 24 hours: Temp:  [98 F (36.7 C)-99 F (37.2 C)] 98 F (36.7 C) (08/27 0929) Pulse Rate:  [58-92] 58 (08/27 0929) Resp:  [14-19] 14 (08/27 0929) BP: (100-185)/(40-92) 131/60 (08/27 0929) SpO2:  [95 %-100 %] 100 % (08/27 0929) Weight:  [59 kg (130 lb)] 59 kg (130 lb) (08/26 2029)    General:  Alert, well-developed, well-nourished, elderly Head:  Normocephalic and atraumatic. Eyes:  Sclera clear, no icterus. Conjunctiva pink.  Mouth:   No deformity or lesions. Oropharynx pink & moist. Neck:  Supple; no masses or thyromegaly. Chest:  Clear throughout to auscultation.  No acute distress. Heart:  Regular rate and rhythm Abdomen:  Soft, diffusely tender and nondistended.  Msk:  Symmetrical without gross deformities. Normal posture. Extremities:  Without clubbing or edema. Neurologic:  Alert and  oriented x4;  grossly normal neurologically. Skin:  Intact without significant lesions or rashes. Cervical Nodes:  No significant cervical adenopathy. Psych:  Alert and cooperative. Normal mood and affect.  Intake/Output from previous day: 08/26 0701 - 08/27 0700 In: 500 [I.V.:500] Out: -  Intake/Output this shift: No intake/output data recorded.  Lab Results:  Recent Labs  06/27/16 2133 06/28/16 0416  WBC 11.7* 9.4  HGB 10.5* 9.6*  HCT 32.2* 29.4*  PLT 131* 108*   BMET  Recent Labs  06/27/16 2133 06/28/16 0416  NA 139 138  K 3.9 3.3*  CL 107 110  CO2 26 24  GLUCOSE 161* 96  BUN 22* 18  CREATININE 0.97 0.87  CALCIUM 9.8 9.2   LFT  Recent Labs  06/28/16 0416  PROT 6.8  ALBUMIN 3.3*  AST 16  ALT 11*  ALKPHOS 62  BILITOT 0.6    Studies/Results: Ct Abdomen Pelvis W Contrast  Result Date: 06/27/2016 CLINICAL DATA:  Abdominal pain and diarrhea for 2 days. History of bowel volvulus, small bowel obstruction, ventral hernia repair and lymphoma. EXAM: CT ABDOMEN AND PELVIS WITH CONTRAST TECHNIQUE: Multidetector CT imaging of the abdomen and pelvis was performed using the standard protocol following bolus administration of intravenous contrast. CONTRAST:  125mL ISOVUE-300 IOPAMIDOL (ISOVUE-300) INJECTION 61% COMPARISON:  PET- CT May 22, 2016 and CT abdomen and pelvis May 18, 2015 FINDINGS: LUNG BASES: Elevated RIGHT hemidiaphragm mildly displacing the heart to the LEFT. Heart size is normal. Severe coronary artery calcifications. No pericardial effusion. Lung bases are clear. SOLID ORGANS: The liver  demonstrates mild biliary dilatation, status post cholecystectomy. Mild splenomegaly. Pancreas and adrenal glands are unremarkable. GASTROINTESTINAL TRACT: Sigmoid volvulus, with bird's beak appearance and associated swirled mesentery.  Proximal colon is distended to 8.2 cm with large amount of stool and stool air-fluid levels. Distended rectum with air-fluid level. Enteric contrast in the small bowel KIDNEYS/ URINARY TRACT: Kidneys are orthotopic, demonstrating symmetric enhancement. Punctate LEFT upper pole nephrolithiasis. No hydronephrosis or solid renal masses. The unopacified ureters are normal in course and caliber. Delayed imaging through the kidneys demonstrates symmetric prompt contrast excretion within the proximal urinary collecting system. Urinary bladder is partially distended and unremarkable. PERITONEUM/RETROPERITONEUM: Tortuous abdominal aorta, normal in caliber with severe calcific atherosclerosis. No lymphadenopathy by CT size criteria. Internal reproductive organs are unremarkable. Small amount of ascites is likely reactive. No pneumoperitoneum. SOFT TISSUE/OSSEOUS STRUCTURES: Non-suspicious. Sub cm inguinal lymph nodes decreased in size from prior CT. Moderate sacroiliac osteoarthrosis. Severe degenerative change of the lumbar spine. Anterior abdominal wall scarring. IMPRESSION: Sigmoid volvulus resulting in large bowel obstruction. Splenomegaly, in keeping with patient's history of lymphoma. Severe atherosclerosis. Acute findings discussed with and reconfirmed by Dr.ANTHONY ALLEN on 06/27/2016 at 11:35 pm. Electronically Signed   By: Elon Alas M.D.   On: 06/27/2016 23:38    Impression/ Recommendations:  1. 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 Flex sigmoidoscopy by Dr Fuller Plan today with successful decompression.  Surgical options include sigmoid resection vs ostomy vs  pexy to abdominal wall, depending on surgical risk.  We discussed the recurrence rates and risks of each  procedure.  She would like to think about this.  We will get back in touch with her tomorrow to discuss further.    LOS: 0 days   Rosario Adie MD [id number], 10:51 AM

## 2016-06-29 ENCOUNTER — Encounter (HOSPITAL_COMMUNITY): Payer: Self-pay | Admitting: Gastroenterology

## 2016-06-29 DIAGNOSIS — C8304 Small cell B-cell lymphoma, lymph nodes of axilla and upper limb: Secondary | ICD-10-CM

## 2016-06-29 LAB — BASIC METABOLIC PANEL
ANION GAP: 4 — AB (ref 5–15)
BUN: 14 mg/dL (ref 6–20)
CHLORIDE: 112 mmol/L — AB (ref 101–111)
CO2: 21 mmol/L — ABNORMAL LOW (ref 22–32)
Calcium: 8.8 mg/dL — ABNORMAL LOW (ref 8.9–10.3)
Creatinine, Ser: 0.92 mg/dL (ref 0.44–1.00)
GFR calc Af Amer: >60 mL/min (ref >60)
GFR, EST NON AFRICAN AMERICAN: 56 mL/min — AB (ref >60)
Glucose, Bld: 74 mg/dL (ref 65–99)
POTASSIUM: 3.2 mmol/L — AB (ref 3.5–5.1)
SODIUM: 137 mmol/L (ref 135–145)

## 2016-06-29 LAB — CBC
HEMATOCRIT: 27.9 % — AB (ref 36.0–46.0)
HEMOGLOBIN: 9.3 g/dL — AB (ref 12.0–15.0)
MCH: 28.6 pg (ref 26.0–34.0)
MCHC: 33.3 g/dL (ref 30.0–36.0)
MCV: 85.8 fL (ref 78.0–100.0)
Platelets: 104 10*3/uL — ABNORMAL LOW (ref 150–400)
RBC: 3.25 MIL/uL — ABNORMAL LOW (ref 3.87–5.11)
RDW: 13.8 % (ref 11.5–15.5)
WBC: 9.2 10*3/uL (ref 4.0–10.5)

## 2016-06-29 MED ORDER — GABAPENTIN 300 MG PO CAPS
300.0000 mg | ORAL_CAPSULE | ORAL | Status: AC
  Administered 2016-06-30: 300 mg via ORAL
  Filled 2016-06-29: qty 1

## 2016-06-29 MED ORDER — ALVIMOPAN 12 MG PO CAPS
12.0000 mg | ORAL_CAPSULE | Freq: Once | ORAL | Status: AC
  Administered 2016-06-30: 12 mg via ORAL
  Filled 2016-06-29: qty 1

## 2016-06-29 MED ORDER — NEOMYCIN SULFATE 500 MG PO TABS
1000.0000 mg | ORAL_TABLET | ORAL | Status: AC
  Administered 2016-06-29 – 2016-06-30 (×3): 1000 mg via ORAL
  Filled 2016-06-29 (×3): qty 2

## 2016-06-29 MED ORDER — POTASSIUM CHLORIDE CRYS ER 20 MEQ PO TBCR
40.0000 meq | EXTENDED_RELEASE_TABLET | ORAL | Status: AC
  Administered 2016-06-29 (×2): 40 meq via ORAL
  Filled 2016-06-29 (×2): qty 2

## 2016-06-29 MED ORDER — POLYETHYLENE GLYCOL 3350 17 G PO PACK
17.0000 g | PACK | Freq: Every day | ORAL | Status: DC
  Administered 2016-06-29: 17 g via ORAL
  Filled 2016-06-29: qty 1

## 2016-06-29 MED ORDER — POLYETHYLENE GLYCOL 3350 17 G PO PACK
34.0000 g | PACK | Freq: Two times a day (BID) | ORAL | Status: DC
  Administered 2016-06-29 – 2016-06-30 (×2): 34 g via ORAL
  Filled 2016-06-29 (×3): qty 2

## 2016-06-29 MED ORDER — ENSURE ENLIVE PO LIQD
237.0000 mL | Freq: Two times a day (BID) | ORAL | Status: DC
  Administered 2016-06-29 – 2016-07-02 (×3): 237 mL via ORAL

## 2016-06-29 MED ORDER — ACETAMINOPHEN 500 MG PO TABS
1000.0000 mg | ORAL_TABLET | ORAL | Status: AC
  Administered 2016-06-30: 1000 mg via ORAL
  Filled 2016-06-29: qty 2

## 2016-06-29 MED ORDER — METRONIDAZOLE 500 MG PO TABS: 1000.0000 mg | ORAL_TABLET | ORAL | Status: DC

## 2016-06-29 MED ORDER — BUPIVACAINE LIPOSOME 1.3 % IJ SUSP
20.0000 mL | INTRAMUSCULAR | Status: AC
  Filled 2016-06-29 (×2): qty 20

## 2016-06-29 MED ORDER — DEXTROSE 5 % IV SOLN
2.0000 g | INTRAVENOUS | Status: AC
  Administered 2016-06-30: 2 g via INTRAVENOUS
  Filled 2016-06-29: qty 2

## 2016-06-29 MED ORDER — SODIUM CHLORIDE 0.9 % IV SOLN: 1.0000 "application " | INTRAVENOUS | Status: DC

## 2016-06-29 MED ORDER — NEOMYCIN SULFATE 500 MG PO TABS
1000.0000 mg | ORAL_TABLET | ORAL | Status: DC
  Filled 2016-06-29 (×2): qty 2

## 2016-06-29 MED ORDER — METRONIDAZOLE 500 MG PO TABS
1000.0000 mg | ORAL_TABLET | ORAL | Status: AC
  Administered 2016-06-29 – 2016-06-30 (×3): 1000 mg via ORAL
  Filled 2016-06-29 (×2): qty 2

## 2016-06-29 MED ORDER — NEOMYCIN SULFATE 500 MG PO TABS: 1000.0000 mg | ORAL_TABLET | ORAL | Status: DC

## 2016-06-29 MED ORDER — SODIUM CHLORIDE 0.9 % IV SOLN
INTRAVENOUS | Status: AC
  Filled 2016-06-29 (×2): qty 6

## 2016-06-29 MED ORDER — BISACODYL 5 MG PO TBEC
20.0000 mg | DELAYED_RELEASE_TABLET | Freq: Once | ORAL | Status: AC
  Administered 2016-06-29: 20 mg via ORAL
  Filled 2016-06-29: qty 4

## 2016-06-29 MED ORDER — METRONIDAZOLE 500 MG PO TABS
1000.0000 mg | ORAL_TABLET | ORAL | Status: DC
  Filled 2016-06-29: qty 2

## 2016-06-29 MED ORDER — ALVIMOPAN 12 MG PO CAPS: 12.0000 mg | ORAL_CAPSULE | Freq: Once | ORAL | Status: DC

## 2016-06-29 NOTE — Progress Notes (Signed)
Initial Nutrition Assessment  DOCUMENTATION CODES:   Severe malnutrition in context of chronic illness  INTERVENTION:   -Provide Ensure Enlive po BID, each supplement provides 350 kcal and 20 grams of protein -Recommend ground meats with diet advancement to solid diet. -Encourage PO intake -RD to continue to monitor  NUTRITION DIAGNOSIS:   Malnutrition related to chronic illness as evidenced by severe depletion of body fat, severe depletion of muscle mass.  GOAL:   Patient will meet greater than or equal to 90% of their needs  MONITOR:   PO intake, Supplement acceptance, Labs, Weight trends, I & O's  REASON FOR ASSESSMENT:   Malnutrition Screening Tool    ASSESSMENT:   82 y/o with h/o constipation and sigmoid volvulus (decompressed last month) , DM, HTN, non-Hodgkin B cell lymphoma of axilla, CVA GERD who presents for abdominal pain and is found to have a recurrent sigmoid volvulus.   Patient in room with daughter. Pt reports tolerating clear liquids with no issues. She has no had any full liquids yet. She states she consumes a mostly liquid diet at home with Ensure supplements and V8 juices. RD will order Ensure supplements. She denies any swallowing issues. She states she is missing teeth and needs to grind up her meat to be able to consume. Recommend ground meats with diet advancement to solids.  Per weight history, pt has lost 8 lb since April 2017 (6% wt loss x 4 months, insignificant for time frame). Pt used to weigh 168 lb in 2015.  Nutrition-Focused physical exam completed. Findings are severe fat depletion, severe muscle depletion, and no edema.   Medications: K-DUR tablet every 4 hours, Zofran PRN Labs reviewed: Low K Mg/Phos WNL  Diet Order:  Diet full liquid Room service appropriate? Yes; Fluid consistency: Thin  Skin:  Reviewed, no issues  Last BM:  8/27  Height:   Ht Readings from Last 1 Encounters:  06/27/16 5\' 4"  (1.626 m)    Weight:   Wt  Readings from Last 1 Encounters:  06/27/16 130 lb (59 kg)    Ideal Body Weight:  54.5 kg  BMI:  Body mass index is 22.31 kg/m.  Estimated Nutritional Needs:   Kcal:  [id number]  Protein:  70-80g  Fluid:  1.6L/day  EDUCATION NEEDS:   No education needs identified at this time  Clayton Bibles, MS, RD, LDN Pager: [phone] After Hours Pager: [phone]

## 2016-06-29 NOTE — Progress Notes (Addendum)
PROGRESS NOTE    Erika Day  [id number] DOB: [birth year] DOA: 06/27/2016  PCP: Philis Fendt, MD   Brief Narrative:  82 y/o with h/o constipation and sigmoid volvulus (decompressed last month) , DM, HTN, non-Hodgkin B cell lymphoma of axilla, CVA GERD who presents for abdominal pain and is found to have a recurrent sigmoid volvulus.   Subjective: No complaints. Awaiting surgery.   Assessment & Plan:   Principal Problem:   Sigmoid volvulus  - decompressed once again via flex sig per GI - will go for sigmoid colectomy today    Active Problems:   Essential hypertension - holding Lasix and Lisinopril for now - PRN Labetalol  Hypokalemia/ hypomagnesemia - replacing     Anemia, unspecified - chronic issue- stable  Non-Hodgkin B cell lymphoma - being followed by Dr Alvy Bimler, currently not on treatment     Thrombocytopenia - chronic issue- stable    Protein-calorie malnutrition, severe  - start supplements once we have determined that she is tolerating a diet   DVT prophylaxis: Lovenox Code Status: Full code Family Communication:  Disposition Plan: home vs SNF when stable GI Gens surgery Procedures:   Flex Sig Antimicrobials:  Anti-infectives    None       Objective: Vitals:   06/28/16 1829 06/28/16 2028 06/29/16 0012 06/29/16 0440  BP: 140/66 (!) 153/71 (!) 161/63 (!) 149/60  Pulse: 69 67 71 (!) 56  Resp: 16 18 16 14   Temp: 98.2 F (36.8 C) 98.7 F (37.1 C) 98.5 F (36.9 C) 98.4 F (36.9 C)  TempSrc: Oral Oral Oral Oral  SpO2: 99% 100% 99% 98%  Weight:      Height:        Intake/Output Summary (Last 24 hours) at 06/29/16 1220 Last data filed at 06/29/16 0900  Gross per 24 hour  Intake          3068.33 ml  Output                0 ml  Net          3068.33 ml   Filed Weights   06/27/16 2029  Weight: 59 kg (130 lb)    Examination: General exam: Appears comfortable  HEENT: PERRLA, oral mucosa moist, no sclera icterus or  thrush Respiratory system: Clear to auscultation. Respiratory effort normal. Cardiovascular system: S1 & S2 heard, RRR.  No murmurs  Gastrointestinal system: Abdomen soft, non-tender, nondistended. Normal bowel sound. No organomegaly Central nervous system: Alert and oriented. No focal neurological deficits. Extremities: No cyanosis, clubbing or edema Skin: No rashes or ulcers Psychiatry:  Mood & affect appropriate.     Data Reviewed: I have personally reviewed following labs and imaging studies  CBC:  Recent Labs Lab 06/27/16 2133 06/28/16 0416 06/29/16 0345  WBC 11.7* 9.4 9.2  HGB 10.5* 9.6* 9.3*  HCT 32.2* 29.4* 27.9*  MCV 86.8 85.0 85.8  PLT 131* 108* 123456*   Basic Metabolic Panel:  Recent Labs Lab 06/27/16 2133 06/28/16 0416 06/29/16 0345  NA 139 138 137  K 3.9 3.3* 3.2*  CL 107 110 112*  CO2 26 24 21*  GLUCOSE 161* 96 74  BUN 22* 18 14  CREATININE 0.97 0.87 0.92  CALCIUM 9.8 9.2 8.8*  MG  --  1.8  --   PHOS  --  3.0  --    GFR: Estimated Creatinine Clearance: 39.3 mL/min (by C-G formula based on SCr of 0.92 mg/dL). Liver Function Tests:  Recent Labs Lab 06/27/16  2133 06/28/16 0416  AST 19 16  ALT 10* 11*  ALKPHOS 71 62  BILITOT 0.5 0.6  PROT 7.8 6.8  ALBUMIN 3.9 3.3*    Recent Labs Lab 06/27/16 2133  LIPASE 43   No results for input(s): AMMONIA in the last 168 hours. Coagulation Profile: No results for input(s): INR, PROTIME in the last 168 hours. Cardiac Enzymes: No results for input(s): CKTOTAL, CKMB, CKMBINDEX, TROPONINI in the last 168 hours. BNP (last 3 results) No results for input(s): PROBNP in the last 8760 hours. HbA1C: No results for input(s): HGBA1C in the last 72 hours. CBG:  Recent Labs Lab 06/28/16 0829 06/28/16 1151  GLUCAP 94 79   Lipid Profile: No results for input(s): CHOL, HDL, LDLCALC, TRIG, CHOLHDL, LDLDIRECT in the last 72 hours. Thyroid Function Tests:  Recent Labs  06/28/16 0416  TSH 2.318   Anemia  Panel: No results for input(s): VITAMINB12, FOLATE, FERRITIN, TIBC, IRON, RETICCTPCT in the last 72 hours. Urine analysis:    Component Value Date/Time   COLORURINE YELLOW 06/28/2016 0652   APPEARANCEUR CLEAR 06/28/2016 0652   LABSPEC 1.044 (H) 06/28/2016 0652   PHURINE 6.0 06/28/2016 0652   GLUCOSEU NEGATIVE 06/28/2016 0652   HGBUR NEGATIVE 06/28/2016 0652   BILIRUBINUR NEGATIVE 06/28/2016 0652   KETONESUR NEGATIVE 06/28/2016 0652   PROTEINUR NEGATIVE 06/28/2016 0652   UROBILINOGEN 0.2 05/17/2015 2307   NITRITE NEGATIVE 06/28/2016 0652   LEUKOCYTESUR NEGATIVE 06/28/2016 0652   Sepsis Labs: @LABRCNTIP (procalcitonin:4,lacticidven:4) )No results found for this or any previous visit (from the past 240 hour(s)).       Radiology Studies: Ct Abdomen Pelvis W Contrast  Result Date: 06/27/2016 CLINICAL DATA:  Abdominal pain and diarrhea for 2 days. History of bowel volvulus, small bowel obstruction, ventral hernia repair and lymphoma. EXAM: CT ABDOMEN AND PELVIS WITH CONTRAST TECHNIQUE: Multidetector CT imaging of the abdomen and pelvis was performed using the standard protocol following bolus administration of intravenous contrast. CONTRAST:  127mL ISOVUE-300 IOPAMIDOL (ISOVUE-300) INJECTION 61% COMPARISON:  PET- CT May 22, 2016 and CT abdomen and pelvis May 18, 2015 FINDINGS: LUNG BASES: Elevated RIGHT hemidiaphragm mildly displacing the heart to the LEFT. Heart size is normal. Severe coronary artery calcifications. No pericardial effusion. Lung bases are clear. SOLID ORGANS: The liver demonstrates mild biliary dilatation, status post cholecystectomy. Mild splenomegaly. Pancreas and adrenal glands are unremarkable. GASTROINTESTINAL TRACT: Sigmoid volvulus, with bird's beak appearance and associated swirled mesentery. Proximal colon is distended to 8.2 cm with large amount of stool and stool air-fluid levels. Distended rectum with air-fluid level. Enteric contrast in the small bowel KIDNEYS/  URINARY TRACT: Kidneys are orthotopic, demonstrating symmetric enhancement. Punctate LEFT upper pole nephrolithiasis. No hydronephrosis or solid renal masses. The unopacified ureters are normal in course and caliber. Delayed imaging through the kidneys demonstrates symmetric prompt contrast excretion within the proximal urinary collecting system. Urinary bladder is partially distended and unremarkable. PERITONEUM/RETROPERITONEUM: Tortuous abdominal aorta, normal in caliber with severe calcific atherosclerosis. No lymphadenopathy by CT size criteria. Internal reproductive organs are unremarkable. Small amount of ascites is likely reactive. No pneumoperitoneum. SOFT TISSUE/OSSEOUS STRUCTURES: Non-suspicious. Sub cm inguinal lymph nodes decreased in size from prior CT. Moderate sacroiliac osteoarthrosis. Severe degenerative change of the lumbar spine. Anterior abdominal wall scarring. IMPRESSION: Sigmoid volvulus resulting in large bowel obstruction. Splenomegaly, in keeping with patient's history of lymphoma. Severe atherosclerosis. Acute findings discussed with and reconfirmed by Dr.ANTHONY ALLEN on 06/27/2016 at 11:35 pm. Electronically Signed   By: Elon Alas M.D.   On:  06/27/2016 23:38      Scheduled Meds: . enoxaparin (LOVENOX) injection  40 mg Subcutaneous Q24H  . feeding supplement (ENSURE ENLIVE)  237 mL Oral BID BM  . pantoprazole  40 mg Oral Daily  . potassium chloride  40 mEq Oral Q4H  . prednisoLONE acetate  2 drop Right Eye BID   And  . prednisoLONE acetate  1 drop Left Eye BID  . sertraline  25 mg Oral Daily  . sodium chloride flush  3 mL Intravenous Q12H   Continuous Infusions: . sodium chloride 125 mL/hr at 06/29/16 0729     LOS: 1 day    Time spent in minutes: 53    [place], MD [facility] Pager: [email or web address] Password [email or web address] 06/29/2016, 12:20 PM

## 2016-06-29 NOTE — Progress Notes (Signed)
Progress Note   Subjective  Chief Complaint: Abdominal Pain, Sigmoid volvulus  Pt doing well this morning, she does tell me that she would like to have a little bit further discussion with surgery regarding her possible choices to prevent this happening in the future. She is aware that she is "old", and has "heals slowly", but would like to discuss if there is any way that she can keep this from happening.  She is tolerating a clear liquid diet. Continues to pass gas and is having small amounts of what sounds like liquid stool.    Objective   Vital signs in last 24 hours: Temp:  [98.2 F (36.8 C)-98.7 F (37.1 C)] 98.4 F (36.9 C) (08/28 0440) Pulse Rate:  [56-71] 56 (08/28 0440) Resp:  [14-18] 14 (08/28 0440) BP: (140-161)/(60-73) 149/60 (08/28 0440) SpO2:  [98 %-100 %] 98 % (08/28 0440) Last BM Date: 06/28/16 General:    Elderly African American female in NAD Heart:  Regular rate and rhythm; no murmurs Lungs: Respirations even and unlabored, lungs CTA bilaterally Abdomen:  Soft, nontender and nondistended. Normal bowel sounds. Extremities:  Without edema. Neurologic:  Alert and oriented,  grossly normal neurologically. Psych:  Cooperative. Normal mood and affect.  Intake/Output from previous day: 08/27 0701 - 08/28 0700 In: 2828.3 [P.O.:1320; I.V.:1108.3; IV Piggyback:400] Out: -   Lab Results:  Recent Labs  06/27/16 2133 06/28/16 0416 06/29/16 0345  WBC 11.7* 9.4 9.2  HGB 10.5* 9.6* 9.3*  HCT 32.2* 29.4* 27.9*  PLT 131* 108* 104*   BMET  Recent Labs  06/27/16 2133 06/28/16 0416 06/29/16 0345  NA 139 138 137  K 3.9 3.3* 3.2*  CL 107 110 112*  CO2 26 24 21*  GLUCOSE 161* 96 74  BUN 22* 18 14  CREATININE 0.97 0.87 0.92  CALCIUM 9.8 9.2 8.8*   LFT  Recent Labs  06/28/16 0416  PROT 6.8  ALBUMIN 3.3*  AST 16  ALT 11*  ALKPHOS 62  BILITOT 0.6   PT/INR No results for input(s): LABPROT, INR in the last 72 hours.  Studies/Results: Ct Abdomen  Pelvis W Contrast  Result Date: 06/27/2016 CLINICAL DATA:  Abdominal pain and diarrhea for 2 days. History of bowel volvulus, small bowel obstruction, ventral hernia repair and lymphoma. EXAM: CT ABDOMEN AND PELVIS WITH CONTRAST TECHNIQUE: Multidetector CT imaging of the abdomen and pelvis was performed using the standard protocol following bolus administration of intravenous contrast. CONTRAST:  161mL ISOVUE-300 IOPAMIDOL (ISOVUE-300) INJECTION 61% COMPARISON:  PET- CT May 22, 2016 and CT abdomen and pelvis May 18, 2015 FINDINGS: LUNG BASES: Elevated RIGHT hemidiaphragm mildly displacing the heart to the LEFT. Heart size is normal. Severe coronary artery calcifications. No pericardial effusion. Lung bases are clear. SOLID ORGANS: The liver demonstrates mild biliary dilatation, status post cholecystectomy. Mild splenomegaly. Pancreas and adrenal glands are unremarkable. GASTROINTESTINAL TRACT: Sigmoid volvulus, with bird's beak appearance and associated swirled mesentery. Proximal colon is distended to 8.2 cm with large amount of stool and stool air-fluid levels. Distended rectum with air-fluid level. Enteric contrast in the small bowel KIDNEYS/ URINARY TRACT: Kidneys are orthotopic, demonstrating symmetric enhancement. Punctate LEFT upper pole nephrolithiasis. No hydronephrosis or solid renal masses. The unopacified ureters are normal in course and caliber. Delayed imaging through the kidneys demonstrates symmetric prompt contrast excretion within the proximal urinary collecting system. Urinary bladder is partially distended and unremarkable. PERITONEUM/RETROPERITONEUM: Tortuous abdominal aorta, normal in caliber with severe calcific atherosclerosis. No lymphadenopathy by CT size criteria. Internal reproductive  organs are unremarkable. Small amount of ascites is likely reactive. No pneumoperitoneum. SOFT TISSUE/OSSEOUS STRUCTURES: Non-suspicious. Sub cm inguinal lymph nodes decreased in size from prior CT.  Moderate sacroiliac osteoarthrosis. Severe degenerative change of the lumbar spine. Anterior abdominal wall scarring. IMPRESSION: Sigmoid volvulus resulting in large bowel obstruction. Splenomegaly, in keeping with patient's history of lymphoma. Severe atherosclerosis. Acute findings discussed with and reconfirmed by Dr.ANTHONY ALLEN on 06/27/2016 at 11:35 pm. Electronically Signed   By: Elon Alas M.D.   On: 06/27/2016 23:38   Flex Sigmoidoscopy, Dr. Fuller Plan, 06/28/16: Findings:      The perianal and digital rectal examinations were normal. Retroflexed       view of the rectum was unremarkable.      A volvulus twist was noted at 25 cm, with very mild segmental ischemia       proximal to this area, in the sigmoid colon. The colon was dilated       proximal to this area with air and liquid stool which was suctioned and       decompressed. Decompression of the volvulus was successful, with       complete decompression achieved. The mucosa otherwise was unremarkable       in the rectum and descending colon. Impression:               - Preparation of the colon was poor.                           - Volvulus. Successful complete decompression                            achieved.                           - Very mild ischemic changes noted                           - No specimens collected.   Assessment / Plan:   Assessment: 1 Recurrent sigmoid volvulus resulting in a large bowel obstruction: Flex sigmoidoscopy performed on 06/28/16 with good results, patient is now passing small amounts of stool and gas and tolerating a clear liquid diet  Plan: 1. Patient would like to further discuss with surgery regarding possible interventions to prevent future recurrences. 2. Encourage the patient to continue a daily laxative regimen to prevent constipation 3. Will increase patient's diet to full liquid 4. Will discuss above with Dr. Silverio Decamp, we will sign off  Thank you for your kind consultation. Please  let us know if we may be of any  further assistance.    LOS: 1 day   [name]  06/29/2016, 9:37 AM  Pager # [phone]

## 2016-06-29 NOTE — Progress Notes (Signed)
[address] [place], [place] [address] Phone: [phone] FAX: [phone]   Erika Day [id number] Apr 23, 1930  CARE TEAM:  PCP: Philis Fendt, MD  Outpatient Care Team: Patient Care Team: Nolene Ebbs, MD as PCP - General (Internal Medicine) Nolene Ebbs, MD (Internal Medicine) [name], MD as Consulting Physician (Gastroenterology)  Inpatient Treatment Team: Treatment Team: Attending Provider: Debbe Odea, MD; Registered Nurse: Dolores Patty, RN; Rounding Team: Joycelyn Das, MD; Consulting Physician: Ladene Artist, MD; Technician: Suzanna Obey, NT; Registered Nurse: Bayard Hugger, RN; Consulting Physician: Nolon Nations, MD; Registered Nurse: Don Perking, RN; Registered Nurse: Dellie Burns, RN; Technician: Ardelia Mems, NT  Problem List:   Principal Problem:   Sigmoid volvulus [facility]) Active Problems:   Essential hypertension   Anemia, unspecified   Thrombocytopenia ([place])   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  As she has had an early recurrence, I think nonoperative management has failed.  Risk of leak and need for colostomy higher given her advanced age, but usually they situations once again decompressed we can do a primary anastomosis.  Resect out some redundant colon.  She has some limited mobility secondary to gastroenteritis but she does not seem to be severely deconditioned.  She tolerated laparotomy and lysis of adhesions two years ago for a bowel obstruction due to an incarcerated incisional hernia.  She is not develop recurrence.  She does not seem completely medical fragile to me.   She does not want to go through another episode of volvulus with distention and obstipation and pain.    However she wishes to think about things and discussed with her family, especially her husband.    Would like to sense from the primary medicine team on her operative risks are.  She require cardiac clearance etc.  She did have an echocardiogram last month which was 65% ejection fraction. -VTE prophylaxis- SCDs, etc -mobilize as tolerated to help recovery  [name], M.D., F.A.C.S. Gastrointestinal and Minimally Invasive Surgery [facility] [address] [place], [place] [address] [phone] Main / Paging   06/29/2016  Subjective:  Denies abdominal pain.  Has questions about the diagnosis.  Questions about surgery.  Objective:  Vital signs:  Vitals:   06/28/16 1829 06/28/16 2028 06/29/16 0012 06/29/16 0440  BP: 140/66 (!) 153/71 (!) 161/63 (!) 149/60  Pulse: 69 67 71 (!) 56  Resp: 16 18 16 14   Temp: 98.2 F (36.8 C) 98.7 F (37.1 C) 98.5 F (36.9 C) 98.4 F (36.9 C)  TempSrc: Oral Oral Oral Oral  SpO2: 99% 100% 99% 98%  Weight:      Height:        Last BM Date: 06/28/16  Intake/Output   Yesterday:  08/27 0701 - 08/28 0700 In: 2828.3 [P.O.:1320; I.V.:1108.3; IV Piggyback:400] Out: -  This shift:  Total I/O In: 240 [P.O.:240] Out: -   Bowel function:  Flatus: YES  BM:  YES  Drain: (No drain)   Physical Exam:  General: Pt awake/alert/oriented x4 in No acute distress Eyes: PERRL, normal EOM.  Sclera clear.  No icterus Neuro: CN II-XII intact w/o focal sensory/motor  deficits. Lymph: No head/neck/groin lymphadenopathy Psych:  No delerium/psychosis/paranoia.  Some long-term memory fair HENT: Normocephalic, Mucus membranes moist.  No thrush Neck: Supple, No tracheal deviation Chest: No chest wall pain w good excursion CV:  Pulses intact.  Regular rhythm MS: Normal AROM mjr joints.  No obvious  deformity Abdomen: Soft.  Nondistended.  Nontender.  No evidence of peritonitis.  No incarcerated hernias. Ext:  SCDs BLE.  No mjr edema.  No cyanosis Skin: No petechiae / purpura  Results:   Labs: Results for orders placed or performed during the hospital encounter of 06/27/16 (from the past 48 hour(s))  Lipase, blood     Status: None   Collection Time: 06/27/16  9:33 PM  Result Value Ref Range   Lipase 43 11 - 51 U/L  Comprehensive metabolic panel     Status: Abnormal   Collection Time: 06/27/16  9:33 PM  Result Value Ref Range   Sodium 139 135 - 145 mmol/L   Potassium 3.9 3.5 - 5.1 mmol/L   Chloride 107 101 - 111 mmol/L   CO2 26 22 - 32 mmol/L   Glucose, Bld 161 (H) 65 - 99 mg/dL   BUN 22 (H) 6 - 20 mg/dL   Creatinine, Ser 0.97 0.44 - 1.00 mg/dL   Calcium 9.8 8.9 - 10.3 mg/dL   Total Protein 7.8 6.5 - 8.1 g/dL   Albumin 3.9 3.5 - 5.0 g/dL   AST 19 15 - 41 U/L   ALT 10 (L) 14 - 54 U/L   Alkaline Phosphatase 71 38 - 126 U/L   Total Bilirubin 0.5 0.3 - 1.2 mg/dL   GFR calc non Af Amer 52 (L) >60 mL/min   GFR calc Af Amer >60 >60 mL/min    Comment: (NOTE) The eGFR has been calculated using the CKD EPI equation. This calculation has not been validated in all clinical situations. eGFR's persistently <60 mL/min signify possible Chronic Kidney Disease.    Anion gap 6 5 - 15  CBC     Status: Abnormal   Collection Time: 06/27/16  9:33 PM  Result Value Ref Range   WBC 11.7 (H) 4.0 - 10.5 K/uL   RBC 3.71 (L) 3.87 - 5.11 MIL/uL   Hemoglobin 10.5 (L) 12.0 - 15.0 g/dL   HCT 32.2 (L) 36.0 - 46.0 %   MCV 86.8 78.0 - 100.0 fL   MCH 28.3 26.0 - 34.0 pg   MCHC 32.6 30.0 - 36.0 g/dL   RDW 14.0 11.5 - 15.5 %   Platelets 131 (L) 150 - 400 K/uL  Magnesium     Status: None   Collection Time: 06/28/16  4:16 AM  Result Value Ref Range   Magnesium 1.8 1.7 - 2.4 mg/dL  Phosphorus     Status: None   Collection Time: 06/28/16  4:16 AM  Result Value Ref Range   Phosphorus 3.0 2.5 - 4.6  mg/dL  TSH     Status: None   Collection Time: 06/28/16  4:16 AM  Result Value Ref Range   TSH 2.318 0.350 - 4.500 uIU/mL  Comprehensive metabolic panel     Status: Abnormal   Collection Time: 06/28/16  4:16 AM  Result Value Ref Range   Sodium 138 135 - 145 mmol/L   Potassium 3.3 (L) 3.5 - 5.1 mmol/L   Chloride 110 101 - 111 mmol/L   CO2 24 22 - 32 mmol/L   Glucose, Bld 96 65 - 99 mg/dL   BUN 18 6 - 20 mg/dL  Creatinine, Ser 0.87 0.44 - 1.00 mg/dL   Calcium 9.2 8.9 - 10.3 mg/dL   Total Protein 6.8 6.5 - 8.1 g/dL   Albumin 3.3 (L) 3.5 - 5.0 g/dL   AST 16 15 - 41 U/L   ALT 11 (L) 14 - 54 U/L   Alkaline Phosphatase 62 38 - 126 U/L   Total Bilirubin 0.6 0.3 - 1.2 mg/dL   GFR calc non Af Amer 59 (L) >60 mL/min   GFR calc Af Amer >60 >60 mL/min    Comment: (NOTE) The eGFR has been calculated using the CKD EPI equation. This calculation has not been validated in all clinical situations. eGFR's persistently <60 mL/min signify possible Chronic Kidney Disease.    Anion gap 4 (L) 5 - 15  CBC     Status: Abnormal   Collection Time: 06/28/16  4:16 AM  Result Value Ref Range   WBC 9.4 4.0 - 10.5 K/uL   RBC 3.46 (L) 3.87 - 5.11 MIL/uL   Hemoglobin 9.6 (L) 12.0 - 15.0 g/dL   HCT 29.4 (L) 36.0 - 46.0 %   MCV 85.0 78.0 - 100.0 fL   MCH 27.7 26.0 - 34.0 pg   MCHC 32.7 30.0 - 36.0 g/dL   RDW 13.8 11.5 - 15.5 %   Platelets 108 (L) 150 - 400 K/uL    Comment: RESULT REPEATED AND VERIFIED SPECIMEN CHECKED FOR CLOTS PLATELET COUNT CONFIRMED BY SMEAR   Urinalysis, Routine w reflex microscopic     Status: Abnormal   Collection Time: 06/28/16  6:52 AM  Result Value Ref Range   Color, Urine YELLOW YELLOW   APPearance CLEAR CLEAR   Specific Gravity, Urine 1.044 (H) 1.005 - 1.030   pH 6.0 5.0 - 8.0   Glucose, UA NEGATIVE NEGATIVE mg/dL   Hgb urine dipstick NEGATIVE NEGATIVE   Bilirubin Urine NEGATIVE NEGATIVE   Ketones, ur NEGATIVE NEGATIVE mg/dL   Protein, ur NEGATIVE NEGATIVE mg/dL    Nitrite NEGATIVE NEGATIVE   Leukocytes, UA NEGATIVE NEGATIVE    Comment: MICROSCOPIC NOT DONE ON URINES WITH NEGATIVE PROTEIN, BLOOD, LEUKOCYTES, NITRITE, OR GLUCOSE <1000 mg/dL.  Glucose, capillary     Status: None   Collection Time: 06/28/16  8:29 AM  Result Value Ref Range   Glucose-Capillary 94 65 - 99 mg/dL   Comment 1 Notify RN    Comment 2 Document in Chart   Glucose, capillary     Status: None   Collection Time: 06/28/16 11:51 AM  Result Value Ref Range   Glucose-Capillary 79 65 - 99 mg/dL   Comment 1 Notify RN    Comment 2 Document in Chart   Basic metabolic panel     Status: Abnormal   Collection Time: 06/29/16  3:45 AM  Result Value Ref Range   Sodium 137 135 - 145 mmol/L   Potassium 3.2 (L) 3.5 - 5.1 mmol/L   Chloride 112 (H) 101 - 111 mmol/L   CO2 21 (L) 22 - 32 mmol/L   Glucose, Bld 74 65 - 99 mg/dL   BUN 14 6 - 20 mg/dL   Creatinine, Ser 0.92 0.44 - 1.00 mg/dL   Calcium 8.8 (L) 8.9 - 10.3 mg/dL   GFR calc non Af Amer 56 (L) >60 mL/min   GFR calc Af Amer >60 >60 mL/min    Comment: (NOTE) The eGFR has been calculated using the CKD EPI equation. This calculation has not been validated in all clinical situations. eGFR's persistently <60 mL/min signify possible Chronic Kidney  Disease.    Anion gap 4 (L) 5 - 15  CBC     Status: Abnormal   Collection Time: 06/29/16  3:45 AM  Result Value Ref Range   WBC 9.2 4.0 - 10.5 K/uL   RBC 3.25 (L) 3.87 - 5.11 MIL/uL   Hemoglobin 9.3 (L) 12.0 - 15.0 g/dL   HCT 27.9 (L) 36.0 - 46.0 %   MCV 85.8 78.0 - 100.0 fL   MCH 28.6 26.0 - 34.0 pg   MCHC 33.3 30.0 - 36.0 g/dL   RDW 13.8 11.5 - 15.5 %   Platelets 104 (L) 150 - 400 K/uL    Comment: CONSISTENT WITH PREVIOUS RESULT    Imaging / Studies: Ct Abdomen Pelvis W Contrast  Result Date: 06/27/2016 CLINICAL DATA:  Abdominal pain and diarrhea for 2 days. History of bowel volvulus, small bowel obstruction, ventral hernia repair and lymphoma. EXAM: CT ABDOMEN AND PELVIS WITH  CONTRAST TECHNIQUE: Multidetector CT imaging of the abdomen and pelvis was performed using the standard protocol following bolus administration of intravenous contrast. CONTRAST:  194m ISOVUE-300 IOPAMIDOL (ISOVUE-300) INJECTION 61% COMPARISON:  PET- CT May 22, 2016 and CT abdomen and pelvis May 18, 2015 FINDINGS: LUNG BASES: Elevated RIGHT hemidiaphragm mildly displacing the heart to the LEFT. Heart size is normal. Severe coronary artery calcifications. No pericardial effusion. Lung bases are clear. SOLID ORGANS: The liver demonstrates mild biliary dilatation, status post cholecystectomy. Mild splenomegaly. Pancreas and adrenal glands are unremarkable. GASTROINTESTINAL TRACT: Sigmoid volvulus, with bird's beak appearance and associated swirled mesentery. Proximal colon is distended to 8.2 cm with large amount of stool and stool air-fluid levels. Distended rectum with air-fluid level. Enteric contrast in the small bowel KIDNEYS/ URINARY TRACT: Kidneys are orthotopic, demonstrating symmetric enhancement. Punctate LEFT upper pole nephrolithiasis. No hydronephrosis or solid renal masses. The unopacified ureters are normal in course and caliber. Delayed imaging through the kidneys demonstrates symmetric prompt contrast excretion within the proximal urinary collecting system. Urinary bladder is partially distended and unremarkable. PERITONEUM/RETROPERITONEUM: Tortuous abdominal aorta, normal in caliber with severe calcific atherosclerosis. No lymphadenopathy by CT size criteria. Internal reproductive organs are unremarkable. Small amount of ascites is likely reactive. No pneumoperitoneum. SOFT TISSUE/OSSEOUS STRUCTURES: Non-suspicious. Sub cm inguinal lymph nodes decreased in size from prior CT. Moderate sacroiliac osteoarthrosis. Severe degenerative change of the lumbar spine. Anterior abdominal wall scarring. IMPRESSION: Sigmoid volvulus resulting in large bowel obstruction. Splenomegaly, in keeping with patient's  history of lymphoma. Severe atherosclerosis. Acute findings discussed with and reconfirmed by Dr.ANTHONY ALLEN on 06/27/2016 at 11:35 pm. Electronically Signed   By: CElon AlasM.D.   On: 06/27/2016 23:38    Medications / Allergies: per chart  Antibiotics: Anti-infectives    None        Note: Portions of this report may have been transcribed using voice recognition software. Every effort was made to ensure accuracy; however, inadvertent computerized transcription errors may be present.   Any transcriptional errors that result from this process are unintentional.     SAdin Hector M.D., F.A.C.S. Gastrointestinal and Minimally Invasive Surgery [facility] [address] [place] [phone] / Paging   06/29/2016

## 2016-06-29 NOTE — Progress Notes (Signed)
Erika Day  02-15-1930 [id number]  Patient Care Team: Nolene Ebbs, MD as PCP - General (Internal Medicine) Nolene Ebbs, MD (Internal Medicine) [name], MD as Consulting Physician (Gastroenterology)  Patient's husband arrived.  I again discussed the pathophysiology of sigmoid volvulus and reasoning for surgery.  Long discussion with patient and and especially her husband.  The patient does not want to risk another episode of volvulus as a cause severe pain.  Patient did tolerate laparotomy two years ago.  She is more inclined to proceed with surgery after discussing with myself and her husband.    The anatomy & physiology of the digestive tract was discussed.  The pathophysiology of the colon was discussed.  Natural history risks without surgery was discussed.   I feel the risks of no intervention will lead to serious problems that outweigh the operative risks; therefore, I recommended a partial colectomy to remove the pathology.  Minimally invasive (Robotic/Laparoscopic) & open techniques were discussed.   Risks such as bleeding, infection, abscess, leak, reoperation, injury to other organs, need for repair of tissues / organs, possible ostomy, hernia, heart attack, stroke, death, and other risks were discussed.  I noted a good likelihood this will help address the problem.   Goals of post-operative recovery were discussed as well.   Need for adequate nutrition, daily bowel regimen and healthy physical activity, to optimize recovery was noted as well. We will work to minimize complications.  Educational materials were available as well.  Questions were answered.  The patient expresses understanding & wishes to proceed with surgery.   D/w Dr Wynelle Cleveland.  The patient is of advanced age, she does not have any major cardiopulmonary decompensation.  Not a prohibitive operative risk.    Patient Active Problem List   Diagnosis Date Noted  . Pressure ulcer 05/20/2016  . Lower extremity edema  05/20/2016  . Protein-calorie malnutrition, severe ([place]) 05/20/2016  . Volvulus of sigmoid colon ([place])   . Sigmoid volvulus ([place]) 05/19/2016  . Pancytopenia, acquired ([place]) 05/17/2016  . 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 Constipation 05/15/2016  . Other fatigue 05/15/2016  . CAP (community acquired pneumonia) 02/13/2016  . Anemia, unspecified 02/13/2016  . Thrombocytopenia ([place]) 02/13/2016  . Hyponatremia 02/13/2016  . Kyphosis 02/01/2016  . Pressure ulcer of coccygeal region 01/31/2016  . Mobility impaired 01/31/2016  . Osteoarthritis 01/31/2016  . Essential hypertension 09/06/2011  . Ovarian torsion 08/25/2011    Past Medical History:  Diagnosis Date  . Acid reflux disease   . Cancer [facility])    Looking for documentation of this  . CLL (chronic lymphocytic leukemia) ([place]) 05/15/2016  . DDD (degenerative disc disease), lumbar   . Degenerative joint disease   . Diabetes mellitus without complication ([place])   . Dyslipidemia   . Hypertension   . 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 Panic attacks   . Pneumonia 02/2016  . 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 Stroke [facility]) age 43 yo   poorly controlled DM and Hypertension    Past Surgical History:  Procedure Laterality Date  . ABDOMINAL HYSTERECTOMY  ? 08/25/2011 ?   Has BSO for left benign ovarian tumor with torsion and broad ligament benign tumor, but does not appear had hysterectomy--[facility], NOT ovarian cancer  . CHOLECYSTECTOMY    . FLEXIBLE SIGMOIDOSCOPY N/A 05/19/2016   Procedure: FLEXIBLE SIGMOIDOSCOPY;  Surgeon: Quillian Quince  Merrily Brittle, MD;  Location: [facility];  Service: Endoscopy;  Laterality: N/A;  . FLEXIBLE SIGMOIDOSCOPY N/A 06/28/2016   Procedure: FLEXIBLE SIGMOIDOSCOPY;  Surgeon: Ladene Artist, MD;  Location: [facility];  Service: Endoscopy;  Laterality: N/A;  . LAPAROTOMY N/A 04/29/2014    Procedure: EXPLORATORY LAPAROTOMY;  Surgeon: Imogene Burn. Georgette Dover, MD;  Location: [facility];  Service: General;  Laterality: N/A;  . LYSIS OF ADHESION N/A 04/29/2014   Procedure: EXTENSIVE LYSIS OF ADHESIONS;  Surgeon: Imogene Burn. Georgette Dover, MD;  Location: [facility];  Service: General;  Laterality: N/A;  . VENTRAL HERNIA REPAIR N/A 04/29/2014   Procedure: PRIMARY REPAIR OF VENTRAL HERNIA;  Surgeon: Imogene Burn. Georgette Dover, MD;  Location: [facility];  Service: General;  Laterality: N/A;    Social History   Social History  . Marital status: Married    Spouse name: Dillen Gubser  . Number of children: 0  . Years of education: N/A   Occupational History  . Retired housewife    Social History Main Topics  . Smoking status: Former Smoker    Types: Cigarettes    Quit date: 02/18/1990  . Smokeless tobacco: Former Systems developer    Types: Snuff    Quit date: 02/18/1990  . Alcohol use No     Comment: Only drank alcohol between ages of 6 and 55 yo.  Her father gave it to her  . Drug use: No  . Sexual activity: No   Other Topics Concern  . Not on file   Social History Narrative   Lives at home with her husband, [name], her "adopted daugher, [name]", and [name]'s little 80 yo boy.   Has been in a wheelchair because she kept falling--in wheelchair for about 10 years.   Sometimes gets up and holds onto furniture or wall to get around.     Did have a walker, but fell with that.  At one point had PT   Does not have any biologic children, but has cared for other family members' children through childhood   [name] is the granddaughter of her 79rd cousin.  She raised [name]'s mother, [name] as well.     37 yo son of [name] also lives in their home.     He is in daycare and his great grandmother watches him when she is at work.   Has brought up about 7 children over past 50 years.       Family History  Problem Relation Age of Onset  . Hypertension Mother   . Stroke Mother   . Arthritis Mother   . Alcohol abuse Father      Current Facility-Administered Medications  Medication Dose Route Frequency Provider Last Rate Last Dose  . 0.9 %  sodium chloride infusion   Intravenous Continuous Lacretia Leigh, MD 125 mL/hr at 06/29/16 [phone]    . enoxaparin (LOVENOX) injection 40 mg  40 mg Subcutaneous Q24H Debbe Odea, MD   40 mg at 06/29/16 1343  . feeding supplement (ENSURE ENLIVE) (ENSURE ENLIVE) liquid 237 mL  237 mL Oral BID BM Debbe Odea, MD   237 mL at 06/29/16 1355  . fentaNYL (SUBLIMAZE) injection 50 mcg  50 mcg Intravenous Q2H PRN Toy Baker, MD      . labetalol (NORMODYNE,TRANDATE) injection 10 mg  10 mg Intravenous Q2H PRN Toy Baker, MD      . ondansetron (ZOFRAN) tablet 4 mg  4 mg Oral Q6H PRN Toy Baker, MD       Or  . ondansetron (ZOFRAN)  injection 4 mg  4 mg Intravenous Q6H PRN Toy Baker, MD      . pantoprazole (PROTONIX) EC tablet 40 mg  40 mg Oral Daily Debbe Odea, MD   40 mg at 06/29/16 1010  . polyethylene glycol (MIRALAX / GLYCOLAX) packet 17 g  17 g Oral Daily Debbe Odea, MD   17 g at 06/29/16 1344  . prednisoLONE acetate (PRED FORTE) 1 % ophthalmic suspension 2 drop  2 drop Right Eye BID Debbe Odea, MD   2 drop at 06/29/16 1011   And  . prednisoLONE acetate (PRED FORTE) 1 % ophthalmic suspension 1 drop  1 drop Left Eye BID Debbe Odea, MD   1 drop at 06/29/16 1011  . sertraline (ZOLOFT) tablet 25 mg  25 mg Oral Daily Debbe Odea, MD   25 mg at 06/29/16 0947  . sodium chloride flush (NS) 0.9 % injection 3 mL  3 mL Intravenous Q12H Toy Baker, MD   3 mL at 06/28/16 2135     Allergies  Allergen Reactions  . Morphine And Related Other (See Comments)    Blood sugar dropped one-time    BP (!) 158/78 (BP Location: Right Arm)   Pulse 64   Temp 98.1 F (36.7 C) (Oral)   Resp 15   Ht 5\' 4"  (1.626 m)   Wt 59 kg (130 lb)   SpO2 100%   BMI 22.31 kg/m   Ct Abdomen Pelvis W Contrast  Result Date: 06/27/2016 CLINICAL DATA:  Abdominal pain and  diarrhea for 2 days. History of bowel volvulus, small bowel obstruction, ventral hernia repair and lymphoma. EXAM: CT ABDOMEN AND PELVIS WITH CONTRAST TECHNIQUE: Multidetector CT imaging of the abdomen and pelvis was performed using the standard protocol following bolus administration of intravenous contrast. CONTRAST:  11mL ISOVUE-300 IOPAMIDOL (ISOVUE-300) INJECTION 61% COMPARISON:  PET- CT May 22, 2016 and CT abdomen and pelvis May 18, 2015 FINDINGS: LUNG BASES: Elevated RIGHT hemidiaphragm mildly displacing the heart to the LEFT. Heart size is normal. Severe coronary artery calcifications. No pericardial effusion. Lung bases are clear. SOLID ORGANS: The liver demonstrates mild biliary dilatation, status post cholecystectomy. Mild splenomegaly. Pancreas and adrenal glands are unremarkable. GASTROINTESTINAL TRACT: Sigmoid volvulus, with bird's beak appearance and associated swirled mesentery. Proximal colon is distended to 8.2 cm with large amount of stool and stool air-fluid levels. Distended rectum with air-fluid level. Enteric contrast in the small bowel KIDNEYS/ URINARY TRACT: Kidneys are orthotopic, demonstrating symmetric enhancement. Punctate LEFT upper pole nephrolithiasis. No hydronephrosis or solid renal masses. The unopacified ureters are normal in course and caliber. Delayed imaging through the kidneys demonstrates symmetric prompt contrast excretion within the proximal urinary collecting system. Urinary bladder is partially distended and unremarkable. PERITONEUM/RETROPERITONEUM: Tortuous abdominal aorta, normal in caliber with severe calcific atherosclerosis. No lymphadenopathy by CT size criteria. Internal reproductive organs are unremarkable. Small amount of ascites is likely reactive. No pneumoperitoneum. SOFT TISSUE/OSSEOUS STRUCTURES: Non-suspicious. Sub cm inguinal lymph nodes decreased in size from prior CT. Moderate sacroiliac osteoarthrosis. Severe degenerative change of the lumbar spine.  Anterior abdominal wall scarring. IMPRESSION: Sigmoid volvulus resulting in large bowel obstruction. Splenomegaly, in keeping with patient's history of lymphoma. Severe atherosclerosis. Acute findings discussed with and reconfirmed by Dr.ANTHONY ALLEN on 06/27/2016 at 11:35 pm. Electronically Signed   By: Elon Alas M.D.   On: 06/27/2016 23:38    Note: This dictation was prepared with Dragon/digital dictation along with [employer]. Any transcriptional errors that result from this process are unintentional.

## 2016-06-30 ENCOUNTER — Inpatient Hospital Stay (HOSPITAL_COMMUNITY): Payer: Medicare Other | Admitting: Anesthesiology

## 2016-06-30 ENCOUNTER — Encounter (HOSPITAL_COMMUNITY)
Admission: EM | Disposition: A | Discharge status: Skilled Nursing Facility | Payer: Self-pay | Source: Home / Self Care | Attending: Internal Medicine

## 2016-06-30 ENCOUNTER — Encounter (HOSPITAL_COMMUNITY): Payer: Self-pay | Admitting: Anesthesiology

## 2016-06-30 HISTORY — PX: LAPAROSCOPIC SIGMOID COLECTOMY: SHX5928

## 2016-06-30 LAB — BASIC METABOLIC PANEL
Anion gap: 3 — ABNORMAL LOW (ref 5–15)
BUN: 11 mg/dL (ref 6–20)
CALCIUM: 8.6 mg/dL — AB (ref 8.9–10.3)
CHLORIDE: 116 mmol/L — AB (ref 101–111)
CO2: 19 mmol/L — ABNORMAL LOW (ref 22–32)
CREATININE: 0.72 mg/dL (ref 0.44–1.00)
GFR calc Af Amer: >60 mL/min (ref >60)
GLUCOSE: 101 mg/dL — AB (ref 65–99)
POTASSIUM: 3.2 mmol/L — AB (ref 3.5–5.1)
Sodium: 138 mmol/L (ref 135–145)

## 2016-06-30 LAB — CBC
HCT: 26.4 % — ABNORMAL LOW (ref 36.0–46.0)
Hemoglobin: 8.7 g/dL — ABNORMAL LOW (ref 12.0–15.0)
MCH: 28 pg (ref 26.0–34.0)
MCHC: 33 g/dL (ref 30.0–36.0)
MCV: 84.9 fL (ref 78.0–100.0)
PLATELETS: 100 10*3/uL — AB (ref 150–400)
RBC: 3.11 MIL/uL — ABNORMAL LOW (ref 3.87–5.11)
RDW: 13.9 % (ref 11.5–15.5)
WBC: 9.6 10*3/uL (ref 4.0–10.5)

## 2016-06-30 LAB — MAGNESIUM: MAGNESIUM: 1.5 mg/dL — AB (ref 1.7–2.4)

## 2016-06-30 LAB — SURGICAL PCR SCREEN
MRSA, PCR: NEGATIVE
Staphylococcus aureus: NEGATIVE

## 2016-06-30 LAB — GLUCOSE, CAPILLARY: GLUCOSE-CAPILLARY: 171 mg/dL — AB (ref 65–99)

## 2016-06-30 LAB — TYPE AND SCREEN
ABO/RH(D): A POS
Antibody Screen: NEGATIVE

## 2016-06-30 LAB — HEMOGLOBIN A1C
Hgb A1c MFr Bld: 5.4 % (ref 4.8–5.6)
Mean Plasma Glucose: 108 mg/dL

## 2016-06-30 SURGERY — COLECTOMY, SIGMOID, LAPAROSCOPIC
Anesthesia: General

## 2016-06-30 MED ORDER — MAGNESIUM SULFATE 50 % IJ SOLN
3.0000 g | Freq: Once | INTRAMUSCULAR | Status: DC
  Filled 2016-06-30: qty 6

## 2016-06-30 MED ORDER — LACTATED RINGERS IV SOLN
INTRAVENOUS | Status: DC | PRN
  Administered 2016-06-30: 10:00:00 via INTRAVENOUS

## 2016-06-30 MED ORDER — ALVIMOPAN 12 MG PO CAPS
12.0000 mg | ORAL_CAPSULE | Freq: Two times a day (BID) | ORAL | Status: DC
  Filled 2016-06-30: qty 1

## 2016-06-30 MED ORDER — ROCURONIUM BROMIDE 10 MG/ML (PF) SYRINGE
PREFILLED_SYRINGE | INTRAVENOUS | Status: AC
  Filled 2016-06-30: qty 10

## 2016-06-30 MED ORDER — ASPIRIN EC 81 MG PO TBEC
81.0000 mg | DELAYED_RELEASE_TABLET | Freq: Every day | ORAL | Status: DC
  Administered 2016-07-01 – 2016-07-03 (×3): 81 mg via ORAL
  Filled 2016-06-30 (×3): qty 1

## 2016-06-30 MED ORDER — ALBUMIN HUMAN 5 % IV SOLN
INTRAVENOUS | Status: AC
  Filled 2016-06-30: qty 250

## 2016-06-30 MED ORDER — ONDANSETRON HCL 4 MG/2ML IJ SOLN
INTRAMUSCULAR | Status: AC
  Filled 2016-06-30: qty 2

## 2016-06-30 MED ORDER — LABETALOL HCL 5 MG/ML IV SOLN
10.0000 mg | INTRAVENOUS | Status: DC | PRN
  Filled 2016-06-30: qty 4

## 2016-06-30 MED ORDER — ROCURONIUM BROMIDE 100 MG/10ML IV SOLN
INTRAVENOUS | Status: DC | PRN
  Administered 2016-06-30: 10 mg via INTRAVENOUS
  Administered 2016-06-30: 50 mg via INTRAVENOUS

## 2016-06-30 MED ORDER — BUPIVACAINE LIPOSOME 1.3 % IJ SUSP
INTRAMUSCULAR | Status: DC | PRN
  Administered 2016-06-30: 20 mL

## 2016-06-30 MED ORDER — POTASSIUM CHLORIDE 10 MEQ/100ML IV SOLN
10.0000 meq | INTRAVENOUS | Status: AC
  Filled 2016-06-30 (×4): qty 100

## 2016-06-30 MED ORDER — LIDOCAINE HCL (CARDIAC) 20 MG/ML IV SOLN
INTRAVENOUS | Status: DC | PRN
  Administered 2016-06-30: 50 mg via INTRAVENOUS

## 2016-06-30 MED ORDER — DEXAMETHASONE SODIUM PHOSPHATE 10 MG/ML IJ SOLN
INTRAMUSCULAR | Status: AC
  Filled 2016-06-30: qty 2

## 2016-06-30 MED ORDER — FENTANYL CITRATE (PF) 100 MCG/2ML IJ SOLN
INTRAMUSCULAR | Status: DC | PRN
  Administered 2016-06-30: 25 ug via INTRAVENOUS
  Administered 2016-06-30 (×3): 50 ug via INTRAVENOUS
  Administered 2016-06-30: 25 ug via INTRAVENOUS

## 2016-06-30 MED ORDER — BUPIVACAINE-EPINEPHRINE 0.25% -1:200000 IJ SOLN
INTRAMUSCULAR | Status: DC | PRN
  Administered 2016-06-30: 50 mL

## 2016-06-30 MED ORDER — FENTANYL CITRATE (PF) 100 MCG/2ML IJ SOLN
25.0000 ug | INTRAMUSCULAR | Status: DC | PRN
  Administered 2016-06-30: 25 ug via INTRAVENOUS

## 2016-06-30 MED ORDER — DEXAMETHASONE SODIUM PHOSPHATE 10 MG/ML IJ SOLN
INTRAMUSCULAR | Status: DC | PRN
  Administered 2016-06-30: 10 mg via INTRAVENOUS

## 2016-06-30 MED ORDER — FENTANYL CITRATE (PF) 250 MCG/5ML IJ SOLN
INTRAMUSCULAR | Status: AC
  Filled 2016-06-30: qty 5

## 2016-06-30 MED ORDER — ACETAMINOPHEN 500 MG PO TABS
1000.0000 mg | ORAL_TABLET | Freq: Three times a day (TID) | ORAL | Status: DC
Start: 2016-06-30 — End: 2016-07-03
  Administered 2016-06-30 – 2016-07-03 (×8): 1000 mg via ORAL
  Filled 2016-06-30 (×8): qty 2

## 2016-06-30 MED ORDER — ONDANSETRON HCL 4 MG/2ML IJ SOLN
INTRAMUSCULAR | Status: DC | PRN
  Administered 2016-06-30: 4 mg via INTRAVENOUS

## 2016-06-30 MED ORDER — LORATADINE 10 MG PO TABS
10.0000 mg | ORAL_TABLET | Freq: Every day | ORAL | Status: DC
  Administered 2016-07-01 – 2016-07-03 (×3): 10 mg via ORAL
  Filled 2016-06-30 (×3): qty 1

## 2016-06-30 MED ORDER — METHOCARBAMOL 1000 MG/10ML IJ SOLN
1000.0000 mg | Freq: Four times a day (QID) | INTRAVENOUS | Status: DC | PRN
  Filled 2016-06-30: qty 10

## 2016-06-30 MED ORDER — LACTATED RINGERS IV BOLUS (SEPSIS): 1000.0000 mL | Freq: Three times a day (TID) | INTRAVENOUS | Status: DC | PRN

## 2016-06-30 MED ORDER — LACTATED RINGERS IV SOLN
INTRAVENOUS | Status: DC | PRN
  Administered 2016-06-30 (×2): via INTRAVENOUS

## 2016-06-30 MED ORDER — DEXTROSE 5 % IV SOLN
2.0000 g | Freq: Two times a day (BID) | INTRAVENOUS | Status: AC
  Administered 2016-06-30: 2 g via INTRAVENOUS
  Filled 2016-06-30: qty 2

## 2016-06-30 MED ORDER — EPHEDRINE SULFATE 50 MG/ML IJ SOLN
INTRAMUSCULAR | Status: DC | PRN
  Administered 2016-06-30: 5 mg via INTRAVENOUS
  Administered 2016-06-30: 10 mg via INTRAVENOUS
  Administered 2016-06-30: 5 mg via INTRAVENOUS

## 2016-06-30 MED ORDER — SUCCINYLCHOLINE CHLORIDE 20 MG/ML IJ SOLN
INTRAMUSCULAR | Status: DC | PRN
  Administered 2016-06-30: 60 mg via INTRAVENOUS

## 2016-06-30 MED ORDER — FENTANYL CITRATE (PF) 100 MCG/2ML IJ SOLN
INTRAMUSCULAR | Status: AC
  Filled 2016-06-30: qty 2

## 2016-06-30 MED ORDER — CEFOTETAN DISODIUM-DEXTROSE 2-2.08 GM-% IV SOLR
INTRAVENOUS | Status: AC
  Filled 2016-06-30: qty 50

## 2016-06-30 MED ORDER — PROPOFOL 10 MG/ML IV BOLUS
INTRAVENOUS | Status: AC
  Filled 2016-06-30: qty 20

## 2016-06-30 MED ORDER — GENTAMICIN SULFATE 40 MG/ML IJ SOLN
INTRAMUSCULAR | Status: DC | PRN
  Administered 2016-06-30: 1000 mL via INTRAPERITONEAL

## 2016-06-30 MED ORDER — SODIUM CHLORIDE 0.9 % IJ SOLN
INTRAMUSCULAR | Status: AC
  Filled 2016-06-30: qty 50

## 2016-06-30 MED ORDER — BUPIVACAINE-EPINEPHRINE 0.25% -1:200000 IJ SOLN
INTRAMUSCULAR | Status: AC
  Filled 2016-06-30: qty 1

## 2016-06-30 MED ORDER — SUGAMMADEX SODIUM 200 MG/2ML IV SOLN
INTRAVENOUS | Status: DC | PRN
  Administered 2016-06-30: 150 mg via INTRAVENOUS

## 2016-06-30 MED ORDER — PROPOFOL 10 MG/ML IV BOLUS
INTRAVENOUS | Status: DC | PRN
  Administered 2016-06-30: 90 mg via INTRAVENOUS

## 2016-06-30 SURGICAL SUPPLY — 77 items
APPLIER CLIP 5 13 M/L LIGAMAX5 (MISCELLANEOUS)
APPLIER CLIP ROT 10 11.4 M/L (STAPLE)
APR CLP MED LRG 11.4X10 (STAPLE)
APR CLP MED LRG 5 ANG JAW (MISCELLANEOUS)
CABLE HIGH FREQUENCY MONO STRZ (ELECTRODE) ×3 IMPLANT
CELLS DAT CNTRL 66122 CELL SVR (MISCELLANEOUS) IMPLANT
CHLORAPREP W/TINT 26ML (MISCELLANEOUS) ×1 IMPLANT
CLIP APPLIE 5 13 M/L LIGAMAX5 (MISCELLANEOUS) IMPLANT
CLIP APPLIE ROT 10 11.4 M/L (STAPLE) IMPLANT
COUNTER NEEDLE 20 DBL MAG RED (NEEDLE) ×3 IMPLANT
COVER MAYO STAND STRL (DRAPES) ×9 IMPLANT
COVER SURGICAL LIGHT HANDLE (MISCELLANEOUS) IMPLANT
DECANTER SPIKE VIAL GLASS SM (MISCELLANEOUS) ×3 IMPLANT
DRAIN CHANNEL 19F RND (DRAIN) IMPLANT
DRAPE LAPAROSCOPIC ABDOMINAL (DRAPES) ×3 IMPLANT
DRAPE SURG IRRIG POUCH 19X23 (DRAPES) ×5 IMPLANT
DRSG OPSITE POSTOP 4X10 (GAUZE/BANDAGES/DRESSINGS) IMPLANT
DRSG OPSITE POSTOP 4X6 (GAUZE/BANDAGES/DRESSINGS) ×4 IMPLANT
DRSG OPSITE POSTOP 4X8 (GAUZE/BANDAGES/DRESSINGS) IMPLANT
DRSG TEGADERM 2-3/8X2-3/4 SM (GAUZE/BANDAGES/DRESSINGS) ×6 IMPLANT
DRSG TEGADERM 4X4.75 (GAUZE/BANDAGES/DRESSINGS) IMPLANT
ELECT PENCIL ROCKER SW 15FT (MISCELLANEOUS) ×6 IMPLANT
ELECT REM PT RETURN 15FT ADLT (MISCELLANEOUS) ×3 IMPLANT
ENDOLOOP SUT PDS II  0 18 (SUTURE) ×2
ENDOLOOP SUT PDS II 0 18 (SUTURE) IMPLANT
EVACUATOR SILICONE 100CC (DRAIN) IMPLANT
GAUZE SPONGE 2X2 8PLY STRL LF (GAUZE/BANDAGES/DRESSINGS) ×1 IMPLANT
GAUZE SPONGE 4X4 12PLY STRL (GAUZE/BANDAGES/DRESSINGS) ×3 IMPLANT
GLOVE BIOGEL PI IND STRL 6.5 (GLOVE) IMPLANT
GLOVE BIOGEL PI INDICATOR 6.5 (GLOVE) ×20
GLOVE ECLIPSE 8.0 STRL XLNG CF (GLOVE) ×6 IMPLANT
GLOVE INDICATOR 8.0 STRL GRN (GLOVE) ×6 IMPLANT
GOWN STRL REUS W/TWL XL LVL3 (GOWN DISPOSABLE) ×12 IMPLANT
IRRIG SUCT STRYKERFLOW 2 WTIP (MISCELLANEOUS) ×3
IRRIGATION SUCT STRKRFLW 2 WTP (MISCELLANEOUS) ×1 IMPLANT
LEGGING LITHOTOMY PAIR STRL (DRAPES) ×2 IMPLANT
LUBRICANT JELLY K Y 4OZ (MISCELLANEOUS) ×2 IMPLANT
PACK COLON (CUSTOM PROCEDURE TRAY) ×3 IMPLANT
PAD POSITIONING PINK XL (MISCELLANEOUS) ×3 IMPLANT
PORT LAP GEL ALEXIS MED 5-9CM (MISCELLANEOUS) ×2 IMPLANT
POSITIONER SURGICAL ARM (MISCELLANEOUS) IMPLANT
RETRACTOR WND ALEXIS 18 MED (MISCELLANEOUS) IMPLANT
RTRCTR WOUND ALEXIS 18CM MED (MISCELLANEOUS)
SCISSORS LAP 5X35 DISP (ENDOMECHANICALS) ×3 IMPLANT
SEALER TISSUE G2 STRG ARTC 35C (ENDOMECHANICALS) ×4 IMPLANT
SLEEVE ADV FIXATION 5X100MM (TROCAR) IMPLANT
SLEEVE XCEL OPT CAN 5 100 (ENDOMECHANICALS) IMPLANT
SPONGE GAUZE 2X2 STER 10/PKG (GAUZE/BANDAGES/DRESSINGS) ×2
SPONGE LAP 18X18 X RAY DECT (DISPOSABLE) IMPLANT
STAPLER CIRC ILS CVD 33MM 37CM (STAPLE) ×2 IMPLANT
STAPLER CUT CVD 40MM GREEN (STAPLE) ×2 IMPLANT
STAPLER VISISTAT 35W (STAPLE) IMPLANT
SUT MNCRL AB 4-0 PS2 18 (SUTURE) ×3 IMPLANT
SUT PDS AB 1 CTX 36 (SUTURE) ×14 IMPLANT
SUT PDS AB 1 TP1 96 (SUTURE) IMPLANT
SUT PROLENE 0 CT 2 (SUTURE) ×2 IMPLANT
SUT PROLENE 2 0 SH DA (SUTURE) IMPLANT
SUT SILK 2 0 (SUTURE) ×3
SUT SILK 2 0 SH CR/8 (SUTURE) ×3 IMPLANT
SUT SILK 2-0 18XBRD TIE 12 (SUTURE) ×1 IMPLANT
SUT SILK 3 0 (SUTURE) ×3
SUT SILK 3 0 SH CR/8 (SUTURE) ×3 IMPLANT
SUT SILK 3-0 18XBRD TIE 12 (SUTURE) ×1 IMPLANT
SUT VICRYL 0 UR6 27IN ABS (SUTURE) ×5 IMPLANT
SYS LAPSCP GELPORT 120MM (MISCELLANEOUS)
SYSTEM LAPSCP GELPORT 120MM (MISCELLANEOUS) IMPLANT
TAPE UMBILICAL COTTON 1/8X30 (MISCELLANEOUS) ×5 IMPLANT
TOWEL OR 17X26 10 PK STRL BLUE (TOWEL DISPOSABLE) IMPLANT
TOWEL OR NON WOVEN STRL DISP B (DISPOSABLE) ×3 IMPLANT
TRAY FOLEY W/METER SILVER 14FR (SET/KITS/TRAYS/PACK) ×2 IMPLANT
TRAY FOLEY W/METER SILVER 16FR (SET/KITS/TRAYS/PACK) IMPLANT
TROCAR ADV FIXATION 5X100MM (TROCAR) IMPLANT
TROCAR BLADELESS OPT 5 100 (ENDOMECHANICALS) ×3 IMPLANT
TROCAR XCEL NON-BLD 11X100MML (ENDOMECHANICALS) IMPLANT
TUBING CONNECTING 10 (TUBING) IMPLANT
TUBING CONNECTING 10' (TUBING)
TUBING INSUF HEATED (TUBING) ×3 IMPLANT

## 2016-06-30 NOTE — Transfer of Care (Signed)
Immediate Anesthesia Transfer of Care Note  Patient: Erika Day  Procedure(s) Performed: Procedure(s): [place] sigmoidoscopy, repair recurrent incisional hernia (N/A)  Patient Location: PACU  Anesthesia Type:General  Level of Consciousness: sedated  Airway & Oxygen Therapy: Patient Spontanous Breathing and Patient connected to face mask oxygen  Post-op Assessment: Report given to RN and Post -op Vital signs reviewed and stable  Post vital signs: Reviewed and stable  Last Vitals:  Vitals:   06/30/16 0541 06/30/16 0815  BP: (!) 166/66 (!) 168/63  Pulse: (!) 56 (!) 58  Resp:  18  Temp: 37.1 C 36.7 C    Last Pain:  Vitals:   06/30/16 0955  TempSrc:   PainSc: 0-No pain      Patients Stated Pain Goal: 4 ([id number])  Complications: No apparent anesthesia complications

## 2016-06-30 NOTE — Anesthesia Postprocedure Evaluation (Signed)
Anesthesia Post Note  Patient: [name]  Procedure(s) Performed: Procedure(s) (LRB): LAPAROSCOPIC SIGMOID COLECTOMY,rigid sigmoidoscopy, repair recurrent incisional hernia (N/A)  Patient location during evaluation: PACU Anesthesia Type: General Level of consciousness: awake and alert Pain management: pain level controlled Vital Signs Assessment: post-procedure vital signs reviewed and stable Respiratory status: spontaneous breathing, nonlabored ventilation, respiratory function stable and patient connected to nasal cannula oxygen Cardiovascular status: blood pressure returned to baseline and stable Postop Assessment: no signs of nausea or vomiting Anesthetic complications: no    Last Vitals:  Vitals:   06/30/16 1530 06/30/16 1600  BP: (!) 171/83 (!) 160/75  Pulse: 69 (!) 59  Resp: 16 14  Temp: 37.1 C 36.3 C    Last Pain:  Vitals:   06/30/16 1600  TempSrc: Axillary  PainSc:                  [name]

## 2016-06-30 NOTE — Op Note (Addendum)
06/30/2016  1:12 PM  PATIENT:  Erika Day  82 y.o. female  Patient Care Team: Nolene Ebbs, MD as PCP - General (Internal Medicine) Nolene Ebbs, MD (Internal Medicine) [name], MD as Consulting Physician (Gastroenterology)  PRE-OPERATIVE DIAGNOSIS:  recurrent sigmoid volvulus  POST-OPERATIVE DIAGNOSIS:    Recurrent sigmoid volvulus Recurrent incisional hernia  PROCEDURE:  LAPAROSCOPIC SIGMOID COLECTOMY Rigid sigmoidoscopy,  Primary repair recurrent incisional hernia  SURGEON:  Surgeon(s): Michael Boston, MD  ASSISTANT: Alphonsa Overall, MD   ANESTHESIA:   local and general  EBL:  Total I/O In: 1000 [I.V.:1000] Out: 550 [Urine:500; Blood:50]  Delay start of Pharmacological VTE agent (>24hrs) due to surgical blood loss or risk of bleeding:  no  DRAINS: No drain    SPECIMEN:  Source of Specimen:  Rectosigmoid colon  DISPOSITION OF SPECIMEN:  PATHOLOGY  COUNTS:  YES  PLAN OF CARE: Admit to inpatient   PATIENT DISPOSITION:  PACU - hemodynamically stable.  INDICATION:    Pleasant woman with second recurrence of sigmoid volvulus in a month.  I will advanced age and cardiopulmonary stable and cleared by medicine.  She was to avoid further attacks of pain and obstipation.  I recommended segmental resection:  The anatomy & physiology of the digestive tract was discussed.  The pathophysiology was discussed.  Natural history risks without surgery was discussed.   I worked to give an overview of the disease and the frequent need to have multispecialty involvement.  I feel the risks of no intervention will lead to serious problems that outweigh the operative risks; therefore, I recommended a partial colectomy to remove the pathology.  Laparoscopic & open techniques were discussed.   Risks such as bleeding, infection, abscess, leak, reoperation, possible ostomy, hernia, heart attack, death, and other risks were discussed.  I noted a good likelihood this will help address  the problem.   Goals of post-operative recovery were discussed as well.  We will work to minimize complications.  Educational materials on the pathology had been given in the office.  Questions were answered.    The patient expressed understanding & wished to proceed with surgery.  OR FINDINGS:   Patient had chronically dilated rectosigmoid colon with stricturing consistent with chronic valvular fixation.  Intra-abdominal adhesions  No obvious metastatic disease on visceral parietal peritoneum or liver.  The anastomosis rests 16 cm from the anal verge by rigid proctoscopy.  2.5 cm revealed: Incisional hernia.  Some omentum and part of transverse colon nearby.  Reduced and primarily repaired  DESCRIPTION:   Informed consent was confirmed.  The patient underwent general anaesthesia without difficulty.  The patient was positioned appropriately.  VTE prevention in place.  The patient's abdomen was clipped, prepped, & draped in a sterile fashion.  Surgical timeout confirmed our plan.  The patient was positioned in reverse Trendelenburg.  Abdominal entry was gained using optical entry technique in the  right upper abdomen.  Entry was clean.  I induced carbon dioxide insufflation.  Camera inspection revealed no injury.  Extra ports were carefully placed under direct laparoscopic visualization.  I reflected the greater omentum and the upper abdomen the small bowel in the upper abdomen. I scored the base of peritoneum of the right side of the mesentery of the left colon from the ligament of Treitz to the peritoneal reflection of the mid rectum.  Patient had very chronically dilated rectosigmoid colon with the tip heading up towards the hepatic flexure.  I was able to straighten it out.  It was not necrotic nor ischemic.  I elevated the sigmoid mesentery and enetered into the retro-mesenteric plane. We were able to identify the left ureter and gonadal vessels. We kept those posterior within the  retroperitoneum and elevated the left colon mesentery off that. I did isolated IMA pedicle but did not ligate it yet.  I continued distally and got into the avascular plane posterior to the mesorectum. This allowed me to help mobilize the rectum as well by freeing the mesorectum off the sacrum.  I mobilized the peritoneal coverings towards the peritoneal reflection on both the right and left sides of the rectum.  I could see the right and left ureters and stayed away from them.  I kept the lateral vascular pedicles to the rectum intact.  I skeletonized the lymph nodes off the inferior mesenteric artery pedicle.  I stayed higher up in the mesentery away from the aorta.  I went ahead and ligated the inferior mesenteric artery pedicle with bipolar EnSeal just near its split office.  Hemorrhoidal ring, trying to preserve a good left colic arterial branch.  Took the superior hemorrhoidal vein as well.  I will did have some oozing as it was fibrotic and calcified.  Therefore I ligated the stump with a 0 PDS Endoloop.  We ensured hemostasis. I skeletonized the mesorectum at the junction at the proximal rectum using blunt dissection & bipolar EnSeal.  I mobilized the distal descending colon slightly to help straighten out some redundancy but preserve most of the splenic flexure retroperitoneal adhesions/attachments.   with at the very floppy mid descending colon easily reached down to the pelvis.   I placed a wound protector through a Pfannenstiel incision in the suprapubic region, taking care to avoid bladder injury.  The redundant volvulized and rectosigmoid came out easily.  I skeletonized the mesorectum down to the proximal/mid Grindle junction as it was quite dilated until its more normal caliber.  I transected at this level using a contour stapler. I was able to eviscerate the rectosigmoid and descending colon out the wound.  I chose a region at the  mid descending colon that was soft and easily reached down.  I  skeletonized the distal descending colon preserving as much mesentery as possible to have a good healthy pedicle at the anticipated transection point I clamped the colon proximal to this area using a soft bowel clamp. I transected at the descending/sigmoid junction with a scalpel. I got healthy bleeding mucosa. I transected the remaining specimen mesentery in a radial fashion to preserve good blood supply to the proximal colon end.  We sent the rectosigmoid colon specimen off to go to pathology.  We sized the colon orifice.  I chose a 33 EEA anvil stapler system. I placed the anvil to the open end of the descending colon and closed around it using a 0 Prolene pursestring.  We did copious irrigation with crystalloid solution.  Hemostasis was good.  The distal end of the colon at the handle easily reached down to the rectal stump, therefore, splenic flexure mobilization was not needed.      Dr Lucia Gaskins scrubbed down and did gentle anal dilation and advanced the EEA stapler up the rectal stump. The spike was brought out at the provimal end of the rectal stump under direct visualization.  I attached the anvil of the proximal colon the spike of the stapler. Anvil was tightened down and held clamped for 60 seconds. The EEA stapler was fired and held clamped for 30  seconds. The stapler was released & removed. We noted 2 excellent anastomotic rings. Blue stitch is in the proximal ring.  Dr Lucia Gaskins did rigid proctoscopy noted the anastomosis was at 16 cm from the anal verge consistent with the proximal rectum.  We did a final irrigation of antibiotic solution (900 mg clindamycin/240 mg gentamicin in a liter of crystalloid) & held that for the pelvic air leak test .  The rectum was insufflated the rectum while clamping the colon proximal to that anastomosis.  There was a negative air leak test. There was no tension of mesentery or bowel at the anastomosis.   Tissues looked viable.   patient had some redundant peritoneal  reflection that I intact over the two thirds of the circumference of the anastomosis to help protect it and reinforce it.   We did diagnostic laparoscopy.  We aspirated the antibiotic irrigation.  Hemostasis was good.   Ureters & bowel uninjured.  The anastomosis looked healthy.   We changed gown and gloves.  The patient was re-draped.  Sterile unused instruments were used from this point out per colon SSI prevention protocol.    I closed the 38mm port sites using Monocryl stitch and sterile dressing.  As there was no contamination and she had a hernia of significance with bowel nearby, decided to primarily repaired.  Therefore excise the very narrow milk.  Transversely to come down to the paraumbilical incisional hernia.  I transversely repaired with interrupted #1 PDS suture to good result.  Close skin with 4 Monocryl.  Sterile dressings were applied.  I closed the Pfannenstiel wound using a 0 Vicryl vertical peritoneal closure and a #1 PDS transverse anterior rectal fascial closure. I closed the skin with some interrupted Monocryl stitches. I placed antibiotic-soaked wicks into the closure at the corners of the standstill and the umbilical incision.  I placed a sterile dressing.    Patient is being extubated go to recovery room. I discussed postop care with the patient in detail the office & in the holding area. Instructions are written.  Blood loss minimal and even it was stable.  Therefore plan to return to the floor.    I discussed operative findings, updated the patient's status, discussed probable steps to recovery, and gave postoperative recommendations to the patient's daughter.  Recommendations were made.  Questions were answered.  She expressed understanding & appreciation.   [name], M.D., F.A.C.S. Gastrointestinal and Minimally Invasive Surgery [facility] [address] [place], [place] [address] [phone] Main / Paging

## 2016-06-30 NOTE — Anesthesia Preprocedure Evaluation (Addendum)
Anesthesia Evaluation  Patient identified by MRN, date of birth, ID band Patient awake    Reviewed: Allergy & Precautions, NPO status , Patient's Chart, lab work & pertinent test results  Airway Mallampati: II  TM Distance: >3 FB Neck ROM: Full    Dental  (+) Edentulous Upper, Loose, Dental Advisory Given,    Pulmonary pneumonia, former smoker,    Pulmonary exam normal breath sounds clear to auscultation       Cardiovascular hypertension, Pt. on medications Normal cardiovascular exam Rhythm:Regular Rate:Normal  ECHO 05-19-16:  Study Conclusions  - Left ventricle: The cavity size was normal. There was moderate   concentric hypertrophy. Systolic function was normal. The   estimated ejection fraction was in the range of 60% to 65%. Wall   motion was normal; there were no regional wall motion   abnormalities. Features are consistent with a pseudonormal left   ventricular filling pattern, with concomitant abnormal relaxation   and increased filling pressure (grade 2 diastolic dysfunction).   Doppler parameters are consistent with high ventricular filling   pressure. - Aortic valve: Severe diffuse thickening and calcification. Valve   mobility was restricted. There was mild stenosis. There was   moderate regurgitation. Valve area (VTI): 1.69 cm^2. Valve area   (Vmax): 1.56 cm^2. Valve area (Vmean): 1.54 cm^2. - Aorta: Aortic root dimension: 41 mm (ED). - Aortic root: The aortic root was mildly dilated. - Mitral valve: Calcified annulus. Mild diffuse thickening and   calcification of the anterior leaflet. - Left atrium: The atrium was moderately dilated. - Pulmonary arteries: PA peak pressure: 39 mm Hg (S).  Impressions:  - The right ventricular systolic pressure was increased consistent   with mild pulmonary hypertension.   Neuro/Psych Anxiety CVA    GI/Hepatic Neg liver ROS, GERD  Medicated,  Endo/Other  negative  endocrine ROSdiabetes  Renal/GU negative Renal ROS  negative genitourinary   Musculoskeletal  (+) Arthritis ,   Abdominal   Peds negative pediatric ROS (+)  Hematology  (+) anemia , HGB 8.7 Plt 100K   Anesthesia Other Findings Very loose 3 lower teeth  Reproductive/Obstetrics negative OB ROS                           Anesthesia Physical Anesthesia Plan  ASA: III and emergent  Anesthesia Plan: General   Post-op Pain Management:    Induction: Intravenous  Airway Management Planned: Oral ETT  Additional Equipment:   Intra-op Plan:   Post-operative Plan: Extubation in OR and Possible Post-op intubation/ventilation  Informed Consent: I have reviewed the patients History and Physical, chart, labs and discussed the procedure including the risks, benefits and alternatives for the proposed anesthesia with the patient or authorized representative who has indicated his/her understanding and acceptance.   Dental advisory given  Plan Discussed with: CRNA  Anesthesia Plan Comments: (Type and Screen sent.)       Anesthesia Quick Evaluation

## 2016-06-30 NOTE — Anesthesia Procedure Notes (Signed)
Procedure Name: Intubation Date/Time: 06/30/2016 10:32 AM Performed by: Danley Danker L Patient Re-evaluated:Patient Re-evaluated prior to inductionOxygen Delivery Method: Circle system utilized Preoxygenation: Pre-oxygenation with 100% oxygen Intubation Type: IV induction, Rapid sequence and Cricoid Pressure applied Laryngoscope Size: Miller and 2 Grade View: Grade I Tube type: Oral Tube size: 7.5 mm Number of attempts: 1 Airway Equipment and Method: Stylet Placement Confirmation: ETT inserted through vocal cords under direct vision,  positive ETCO2 and breath sounds checked- equal and bilateral Secured at: 21 cm Tube secured with: Tape Dental Injury: Teeth and Oropharynx as per pre-operative assessment

## 2016-06-30 NOTE — Progress Notes (Signed)
2 Days Post-Op  Subjective: No complaints, OR has called for her.  She is on the phone with her husband.    Objective: Vital signs in last 24 hours: Temp:  [97.9 F (36.6 C)-98.8 F (37.1 C)] 98.8 F (37.1 C) (08/29 0541) Pulse Rate:  [56-66] 56 (08/29 0541) Resp:  [15-16] 16 (08/29 0301) BP: (117-190)/(66-83) 166/66 (08/29 0541) SpO2:  [99 %-100 %] 99 % (08/29 0541) Last BM Date: 06/26/16 700 PO 3500 IV Voided x 3 BM x 3 Afebrile, VSS Mag 1.5, K+ 3.2   Intake/Output from previous day: 08/28 0701 - 08/29 0700 In: 4254.2 [P.O.:700; I.V.:3554.2] Out: -  Intake/Output this shift: No intake/output data recorded.  No exam going to OR, and on phone with family.    Lab Results:   Recent Labs  06/29/16 0345 06/30/16 0403  WBC 9.2 9.6  HGB 9.3* 8.7*  HCT 27.9* 26.4*  PLT 104* 100*    BMET  Recent Labs  06/29/16 0345 06/30/16 0403  NA 137 138  K 3.2* 3.2*  CL 112* 116*  CO2 21* 19*  GLUCOSE 74 101*  BUN 14 11  CREATININE 0.92 0.72  CALCIUM 8.8* 8.6*   PT/INR No results for input(s): LABPROT, INR in the last 72 hours.   Recent Labs Lab 06/27/16 2133 06/28/16 0416  AST 19 16  ALT 10* 11*  ALKPHOS 71 62  BILITOT 0.5 0.6  PROT 7.8 6.8  ALBUMIN 3.9 3.3*     Lipase     Component Value Date/Time   LIPASE 43 06/27/2016 2133     Studies/Results: No results found. Prior to Admission medications   Medication Sig Start Date End Date Taking? Authorizing Provider  aspirin EC 81 MG tablet Take 1 tablet (81 mg total) by mouth daily. 06/05/16  Yes Heath Lark, MD  furosemide (LASIX) 20 MG tablet Take 20 mg by mouth daily as needed for fluid.   Yes Historical Provider, MD  lisinopril (PRINIVIL,ZESTRIL) 10 MG tablet Take 1 tablet (10 mg total) by mouth daily. Reported on 01/31/2016 06/05/16  Yes Heath Lark, MD  Loratadine (CLARITIN PO) Take 10 mg by mouth.   Yes Historical Provider, MD  pantoprazole (PROTONIX) 40 MG tablet Take 1 tablet (40 mg total) by mouth  daily. 06/05/16  Yes Heath Lark, MD  polyethylene glycol powder (GLYCOLAX/MIRALAX) powder Take 17 g by mouth 3 (three) times daily. 06/16/16  Yes Historical Provider, MD  prednisoLONE acetate (PRED FORTE) 1 % ophthalmic suspension Place 1-2 drops into both eyes See admin instructions. 2 drops in right eye and 1 drop in the left eye twice daily   Yes Historical Provider, MD  sertraline (ZOLOFT) 25 MG tablet Take 1 tablet (25 mg total) by mouth daily. Reported on 05/19/2016 06/05/16  Yes Heath Lark, MD  polyethylene glycol (MIRALAX / GLYCOLAX) packet Take 17 g by mouth 2 (two) times daily. Patient not taking: Reported on 06/28/2016 05/22/16   Charlynne Cousins, MD    Medications: . bupivacaine liposome  20 mL Infiltration On Call to OR  . cefoTEtan (CEFOTAN) 2 GM IVPB  2 g Intravenous On Call to OR  . clindamycin / gentamicin INTRAPERITONEAL Lavage irrigation   Intraperitoneal On Call to OR  . enoxaparin (LOVENOX) injection  40 mg Subcutaneous Q24H  . feeding supplement (ENSURE ENLIVE)  237 mL Oral BID BM  . magnesium sulfate 1 - 4 g bolus IVPB  3 g Intravenous Once  . pantoprazole  40 mg Oral Daily  . polyethylene  glycol  34 g Oral BID  . potassium chloride  10 mEq Intravenous Q1 Hr x 4  . prednisoLONE acetate  2 drop Right Eye BID   And  . prednisoLONE acetate  1 drop Left Eye BID  . sertraline  25 mg Oral Daily  . sodium chloride flush  3 mL Intravenous Q12H   . sodium chloride 125 mL/hr at 06/30/16 0141   AODM Small B cell Lymphoma - Dr. Alvy Bimler Hypertension  Assessment/Plan Recurrent sigmoid volvulus S/p exploratory laparotomy, LOA, Ventral hernia repair 04/29/14 S/p Flex sigmoidoscopy 06/28/16 Dr. Lucio Edward HYpokalemia/hypomagnesemia - being replaced FEN:  NPO/IV fluids ID: Flagyl/Neomycin PO preop DVT:  Lovenox     Plan:  Going to OR, K+, and Mag being replaced.  She has order for Abx,but has not been given so far.    LOS: 2 days     Erika Day 06/30/2016 [phone]

## 2016-07-01 DIAGNOSIS — K432 Incisional hernia without obstruction or gangrene: Secondary | ICD-10-CM

## 2016-07-01 LAB — BASIC METABOLIC PANEL
ANION GAP: 3 — AB (ref 5–15)
BUN: 9 mg/dL (ref 6–20)
CALCIUM: 8.7 mg/dL — AB (ref 8.9–10.3)
CHLORIDE: 113 mmol/L — AB (ref 101–111)
CO2: 21 mmol/L — AB (ref 22–32)
Creatinine, Ser: 0.99 mg/dL (ref 0.44–1.00)
GFR calc non Af Amer: 51 mL/min — ABNORMAL LOW (ref >60)
GFR, EST AFRICAN AMERICAN: 59 mL/min — AB (ref >60)
Glucose, Bld: 146 mg/dL — ABNORMAL HIGH (ref 65–99)
POTASSIUM: 3.9 mmol/L (ref 3.5–5.1)
Sodium: 137 mmol/L (ref 135–145)

## 2016-07-01 LAB — GLUCOSE, CAPILLARY: GLUCOSE-CAPILLARY: 65 mg/dL (ref 65–99)

## 2016-07-01 LAB — MAGNESIUM: Magnesium: 1.3 mg/dL — ABNORMAL LOW (ref 1.7–2.4)

## 2016-07-01 MED ORDER — SODIUM CHLORIDE 0.9 % IV SOLN: 250.0000 mL | INTRAVENOUS | Status: DC | PRN

## 2016-07-01 MED ORDER — SODIUM CHLORIDE 0.9% FLUSH: 3.0000 mL | INTRAVENOUS | Status: DC | PRN

## 2016-07-01 MED ORDER — MAGNESIUM SULFATE 2 GM/50ML IV SOLN
2.0000 g | Freq: Once | INTRAVENOUS | Status: AC
  Administered 2016-07-01: 2 g via INTRAVENOUS
  Filled 2016-07-01: qty 50

## 2016-07-01 MED ORDER — SODIUM CHLORIDE 0.9% FLUSH
3.0000 mL | Freq: Two times a day (BID) | INTRAVENOUS | Status: DC
  Administered 2016-07-01 – 2016-07-03 (×3): 3 mL via INTRAVENOUS

## 2016-07-01 MED ORDER — LACTATED RINGERS IV BOLUS (SEPSIS): 1000.0000 mL | Freq: Three times a day (TID) | INTRAVENOUS | Status: DC | PRN

## 2016-07-01 NOTE — Evaluation (Signed)
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 Pt currently with functional limitations due to the deficits listed below (see PT Problem List).  Pt will benefit from skilled PT to increase their independence and safety with mobility to allow discharge to the venue listed below.  Pt requiring increased assist at this time for transfers therefore recommending SNF upon d/c.     Follow Up Recommendations Supervision/Assistance - 24 hour;SNF    Equipment Recommendations  None recommended by PT    Recommendations for Other Services       Precautions / Restrictions Precautions Precautions: Fall      Mobility  Bed Mobility Overal bed mobility: Needs Assistance Bed Mobility: Supine to Sit     Supine to sit: Min assist;HOB elevated     General bed mobility comments: increased time and effort due to abdominal pain, assist for trunk  Transfers Overall transfer level: Needs assistance Equipment used: Rolling walker (2 wheeled) Transfers: Sit to/from [facility] Sit to Stand: Mod assist;+2 physical assistance Stand pivot transfers: +2 physical assistance;Mod assist       General transfer comment: increased assist to rise and steady, pt with posterior lean requiring assist even with pivot to recliner  Ambulation/Gait                Stairs            Wheelchair Mobility    Modified Rankin (Stroke Patients Only)       Balance Overall balance assessment: Needs assistance         Standing balance support:  Bilateral upper extremity supported;During functional activity Standing balance-Leahy Scale: Zero                               Pertinent Vitals/Pain Pain Assessment: 0-10 Pain Score: 1  Pain Location: "sore all over" Pain Descriptors / Indicators: Sore Pain Intervention(s): RN gave pain meds during session;Monitored during session;Limited activity within patient's tolerance;Repositioned    Home Living Family/patient expects to be discharged to:: Private residence Living Arrangements: Spouse/significant other;Children Available Help at Discharge: Family;Personal care attendant;Available 24 hours/day Type of Home: House Home Access: Ramped entrance     Home Layout: One level Home Equipment: Bedside commode;Wheelchair - [employer] - 2 wheels;Shower seat      Prior Function Level of Independence: Independent with assistive device(s)         Comments: states aide assists with IADLs     Hand Dominance        Extremity/Trunk Assessment   Upper Extremity Assessment: Generalized weakness           Lower Extremity Assessment: Generalized weakness         Communication   Communication: No difficulties  Cognition Arousal/Alertness: Awake/alert Behavior During Therapy: WFL for tasks assessed/performed Overall Cognitive Status: Within Functional Limits for tasks assessed                      General Comments      Exercises        Assessment/Plan    PT Assessment  Patient needs continued PT services  PT Diagnosis Difficulty walking;Generalized weakness;Acute pain   PT Problem List Decreased strength;Decreased activity tolerance;Decreased balance;Decreased knowledge of use of DME;Decreased mobility;Pain  PT Treatment Interventions Gait training;DME instruction;Therapeutic activities;Therapeutic exercise;Functional mobility training;Balance training;Patient/family education   PT Goals (Current goals can be found in the Care Plan section)  Acute Rehab PT Goals PT Goal Formulation: With patient Time For Goal Achievement: 07/08/16 Potential to Achieve Goals: Good    Frequency Min 3X/week   Barriers to discharge        Co-evaluation               End of Session Equipment Utilized During Treatment: Gait belt Activity Tolerance: Patient limited by pain Patient left: in chair;with call bell/phone within reach;with chair alarm set Nurse Communication: Mobility status (RN in room during transfer)         Time: [id number] PT Time Calculation (min) (ACUTE ONLY): 19 min   Charges:   PT Evaluation $PT Eval Moderate Complexity: 1 Procedure     PT G Codes:        Mirjana Tarleton,KATHrine E 07/01/2016, 12:05 PM Carmelia Bake, PT, DPT 07/01/2016 Pager: [phone]

## 2016-07-01 NOTE — Progress Notes (Signed)
[facility] Progress Note  Patient: Erika Day [id number]   PCP: Philis Fendt, MD DOB: 1930-07-08   DOA: 06/27/2016   DOS: 07/01/2016   Date of Service: the patient was seen and examined on 07/01/2016  Subjective: Patient denies any nausea or vomiting. Has mild abdominal discomfort. Passing gas. Nutrition: Tolerating clears.  Brief hospital course: Pt. with PMH of DM, HTN, non-Hodgkin's B-cell lymphoma, CVA, GERD; admitted on 06/27/2016, with complaint of nausea and vomiting, was found to have sigmoid volvulus. Patient underwent flexible sigmoidoscopy on 06/28/2016 with gastroenterology. Gen. surgery was consulted and patient underwent laparoscopic sigmoid colectomy on 06/30/2016 for recurrent sigmoid volvulus. Currently further plan is continue monitoring for postoperative recovery.  Assessment and Plan: 1. Sigmoid volvulus ([place]) S/p flexible sigmoidoscopy by gastroenterology 06/28/2016. S/P laparoscopic sigmoid colectomy and primary repair of recurrent incisional hernia 06/30/2016 by general surgery. Currently on clear liquid diet. Continue to follow recommendation from surgery. GI currently signed off.  2. Hypomagnesemia. Replacing will recheck tomorrow  3. Hypokalemia. Continue to monitor  4. Non-Hodgkin's B-cell lymphoma. Currently not on any treatment. Surgical note Mentions no metastasis.  5. Protein calorie malnutrition. Severe. We'll continue to monitor for recovery of GI function and then initiate supplementation.  6. Bicytopenia. We'll continue to closely monitor.  Pain management: When necessary Robaxin, when necessary fentanyl Activity: Consulted physical therapy Bowel regimen: last BM 06/29/2016 Diet: Clear liquid diet DVT Prophylaxis: subcutaneous Heparin  Advance goals of care discussion: Full code  Family Communication: no family was present at bedside, at the time of interview.  Disposition:  Discharge to home. Expected discharge date:  07/03/2016,   Consultants: Gastroenterology, general surgery Procedures: Sigmoid colectomy, sigmoidoscopy  Antibiotics: Anti-infectives    Start     Dose/Rate Route Frequency Ordered Stop   06/30/16 2200  cefoTEtan (CEFOTAN) 2 g in dextrose 5 % 50 mL IVPB     2 g 100 mL/hr over 30 Minutes Intravenous Every 12 hours 06/30/16 1806 06/30/16 2203   06/30/16 1215  clindamycin (CLEOCIN) 900 mg, gentamicin (GARAMYCIN) 240 mg in sodium chloride 0.9 % 1,000 mL for intraperitoneal lavage  Status:  Discontinued       As needed 06/30/16 1257 06/30/16 1333   06/30/16 0600  cefoTEtan (CEFOTAN) 2 g in dextrose 5 % 50 mL IVPB     2 g 100 mL/hr over 30 Minutes Intravenous On call to O.R. 06/29/16 1456 06/30/16 1035   06/30/16 0600  clindamycin (CLEOCIN) 900 mg, gentamicin (GARAMYCIN) 240 mg in sodium chloride 0.9 % 1,000 mL for intraperitoneal lavage    Comments:  Have in the  OR room for final irrigation in bowel surgery case to minimize risk of abscess/infection Pharmacy may adjust dosing strength, schedule, rate of infusion, etc as needed to optimize therapy    Intraperitoneal On call to O.R. 06/29/16 1456 07/01/16 0559   06/29/16 1830  metroNIDAZOLE (FLAGYL) tablet 1,000 mg     1,000 mg Oral 3 times per day on Mon Tue 06/29/16 1825 06/30/16 0141   06/29/16 1830  neomycin (MYCIFRADIN) tablet 1,000 mg     1,000 mg Oral 3 times per day on Mon Tue 06/29/16 1825 06/30/16 0141   06/29/16 1530  neomycin (MYCIFRADIN) tablet 1,000 mg  Status:  Discontinued     1,000 mg Oral 3 times per day 06/29/16 1458 06/29/16 1825   06/29/16 1530  metroNIDAZOLE (FLAGYL) tablet 1,000 mg  Status:  Discontinued     1,000 mg Oral 3 times per day 06/29/16 1458 06/29/16  1825   06/29/16 1500  neomycin (MYCIFRADIN) tablet 1,000 mg  Status:  Discontinued     1,000 mg Oral 3 times per day 06/29/16 1456 06/29/16 1458   06/29/16 1500  metroNIDAZOLE (FLAGYL) tablet 1,000 mg  Status:  Discontinued     1,000 mg Oral 3 times per day  06/29/16 1456 06/29/16 1458   06/29/16 1500  clindamycin (CLEOCIN) 900 mg, gentamicin (GARAMYCIN) 240 mg in sodium chloride 0.9 % 1,000 mL for intraperitoneal lavage  Status:  Discontinued     1 application Intraperitoneal To Surgery 06/29/16 1456 06/29/16 1521        Intake/Output Summary (Last 24 hours) at 07/01/16 0722 Last data filed at 07/01/16 0600  Gross per 24 hour  Intake          3643.75 ml  Output             1880 ml  Net          1763.75 ml   Filed Weights   06/27/16 2029  Weight: 59 kg (130 lb)    Objective: Physical Exam: Vitals:   06/30/16 1600 06/30/16 1640 06/30/16 2100 07/01/16 0518  BP: (!) 160/75 (!) 153/66 140/60 (!) 114/56  Pulse: (!) 59 60 65 60  Resp: 14 15 16 16   Temp: 97.4 F (36.3 C) 97.5 F (36.4 C) 98.2 F (36.8 C) 97.8 F (36.6 C)  TempSrc: Axillary Axillary Axillary Oral  SpO2: 100% 100% 100% 100%  Weight:      Height:        General: Alert, Awake and Oriented to Time, Place and Person. Appear in mild distress Eyes: PERRL, Conjunctiva normal ENT: Oral Mucosa clear moist. Neck: no JVD, no Abnormal Mass Or lumps Cardiovascular: S1 and S2 Present, no Murmur, Respiratory: Bilateral Air entry equal and Decreased, Clear to Auscultation, no Crackles, no wheezes Abdomen: Bowel Sound present, Soft and no tenderness Skin: no redness, no Rash  Extremities: no Pedal edema, no calf tenderness Neurologic: Grossly no focal neuro deficit. Bilaterally Equal motor strength  Data Reviewed: CBC:  Recent Labs Lab 06/27/16 2133 06/28/16 0416 06/29/16 0345 06/30/16 0403  WBC 11.7* 9.4 9.2 9.6  HGB 10.5* 9.6* 9.3* 8.7*  HCT 32.2* 29.4* 27.9* 26.4*  MCV 86.8 85.0 85.8 84.9  PLT 131* 108* 104* 123XX123*   Basic Metabolic Panel:  Recent Labs Lab 06/27/16 2133 06/28/16 0416 06/29/16 0345 06/30/16 0403 07/01/16 0419  NA 139 138 137 138 137  K 3.9 3.3* 3.2* 3.2* 3.9  CL 107 110 112* 116* 113*  CO2 26 24 21* 19* 21*  GLUCOSE 161* 96 74 101* 146*    BUN 22* 18 14 11 9   CREATININE 0.97 0.87 0.92 0.72 0.99  CALCIUM 9.8 9.2 8.8* 8.6* 8.7*  MG  --  1.8  --  1.5* 1.3*  PHOS  --  3.0  --   --   --     Liver Function Tests:  Recent Labs Lab 06/27/16 2133 06/28/16 0416  AST 19 16  ALT 10* 11*  ALKPHOS 71 62  BILITOT 0.5 0.6  PROT 7.8 6.8  ALBUMIN 3.9 3.3*    Recent Labs Lab 06/27/16 2133  LIPASE 43   No results for input(s): AMMONIA in the last 168 hours. Coagulation Profile: No results for input(s): INR, PROTIME in the last 168 hours. Cardiac Enzymes: No results for input(s): CKTOTAL, CKMB, CKMBINDEX, TROPONINI in the last 168 hours. BNP (last 3 results) No results for input(s): PROBNP in the last 8760  hours.  CBG:  Recent Labs Lab 06/28/16 0829 06/28/16 1151 06/30/16 1340  GLUCAP 94 79 171*    Studies: No results found.   Scheduled Meds: . acetaminophen  1,000 mg Oral TID  . alvimopan  12 mg Oral BID  . aspirin EC  81 mg Oral Daily  . enoxaparin (LOVENOX) injection  40 mg Subcutaneous Q24H  . feeding supplement (ENSURE ENLIVE)  237 mL Oral BID BM  . loratadine  10 mg Oral Daily  . magnesium sulfate 1 - 4 g bolus IVPB  2 g Intravenous Once  . pantoprazole  40 mg Oral Daily  . polyethylene glycol  34 g Oral BID  . prednisoLONE acetate  2 drop Right Eye BID   And  . prednisoLONE acetate  1 drop Left Eye BID  . sertraline  25 mg Oral Daily  . sodium chloride flush  3 mL Intravenous Q12H   Continuous Infusions: . sodium chloride 125 mL/hr at 06/30/16 1815   PRN Meds: fentaNYL (SUBLIMAZE) injection, labetalol, labetalol, lactated ringers, methocarbamol (ROBAXIN)  IV, ondansetron **OR** ondansetron (ZOFRAN) IV  Time spent: 30 minutes  Author: Berle Mull, MD [facility] Pager: [phone] 07/01/2016 7:22 AM  If 7PM-7AM, please contact night-coverage at [email or web address], password [email or web address]

## 2016-07-01 NOTE — Progress Notes (Signed)
[address] [place], [place] [address] Phone: [phone] FAX: [phone]   Erika Day [id number] 1930-07-02  CARE TEAM:  PCP: Philis Fendt, MD  Outpatient Care Team: Patient Care Team: Nolene Ebbs, MD as PCP - General (Internal Medicine) Nolene Ebbs, MD (Internal Medicine) [name], MD as Consulting Physician (Gastroenterology)  Inpatient Treatment Team: Treatment Team: Attending Provider: Lavina Hamman, MD; Registered Nurse: Dolores Patty, RN; Rounding Team: Joycelyn Das, MD; Technician: Suzanna Obey, NT; Registered Nurse: Bayard Hugger, RN; Consulting Physician: Nolon Nations, MD; Registered Nurse: Don Perking, RN; Registered Nurse: Thermon Leyland, RN; Registered Nurse: Lottie Dawson, RN; Technician: Carolyn Stare, NT; Physical Therapist: Junius Argyle, PT  Problem List:   Principal Problem:   Sigmoid volvulus s/p lap sigmoid colectomy 06/30/2016 Active Problems:   Essential hypertension   Anemia, unspecified   Thrombocytopenia ([place])   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 ([place])   Protein-calorie malnutrition, severe ([place])   Recurrent ventral incisional hernia s/p primary repair 06/30/2016    06/28/2016  Procedure(s): FLEXIBLE SIGMOIDOSCOPY  06/30/2016:  POST-OPERATIVE DIAGNOSIS:    Recurrent sigmoid volvulus Recurrent incisional hernia  PROCEDURE:  LAPAROSCOPIC SIGMOID COLECTOMY Rigid sigmoidoscopy,  Primary repair recurrent incisional hernia  SURGEON:  Surgeon(s): Michael Boston, MD  ASSISTANT: Alphonsa Overall, MD    Assessment  Recurrent sigmoid volvulus s/p sigmoid colectomy  Plan:  -liquids - adv diet gradually per protocol -replace low Mag -wean IVF as tolerated w backup IVF boluses -d/c foley POD#2 -VTE prophylaxis- SCDs, etc -mobilize as tolerated to help recovery - get her up  I updated the patient's status to the patient.   Recommendations were made.  Questions were answered.  She expressed understanding & appreciation.   [name], M.D., F.A.C.S. Gastrointestinal and Minimally Invasive Surgery [facility] [address], [place], [place] [address] [phone] Main / Paging   07/01/2016  Subjective:  Mild abdominal pain. Drinking liquids  Objective:  Vital signs:  Vitals:   06/30/16 1600 06/30/16 1640 06/30/16 2100 07/01/16 0518  BP: (!) 160/75 (!) 153/66 140/60 (!) 114/56  Pulse: (!) 59 60 65 60  Resp: _0 Temp: 97.4 F (36.3 C) 97.5 F (36.4 C) 98.2 F (36.8 C) 97.8 F (36.6 C)  TempSrc: Axillary Axillary Axillary Oral  SpO2: 100% 100% 100% 100%  Weight:      Height:        Last BM Date: 06/29/16  Intake/Output   Yesterday:  08/29 0701 - 08/30 0700 In: 3643.8 [I.V.:3593.8; IV Piggyback:50] Out: 1880 [OVZCH:8850; Blood:50] This shift:  No intake/output data recorded.  Bowel function:  Flatus: YES  BM:  YES  Drain: (No drain)   Physical Exam:  General: Pt awake/alert/oriented x4 in No acute distress Eyes: PERRL, normal EOM.  Sclera clear.  No icterus Neuro: CN II-XII intact w/o focal sensory/motor deficits. Lymph: No head/neck/groin lymphadenopathy Psych:  No delerium/psychosis/paranoia.  Some long-term memory fair HENT: Normocephalic, Mucus membranes moist.  No thrush Neck: Supple, No tracheal deviation Chest: No chest wall pain w good excursion CV:  Pulses intact.  Regular rhythm MS: Normal AROM mjr joints.  No obvious deformity Abdomen: Soft.  Nondistended.  Mildly tender at incisions only.  No evidence of peritonitis.  No incarcerated hernias. Ext:  SCDs BLE.  No mjr edema.  No cyanosis Skin: No petechiae / purpura  Results:   Labs: Results  for orders placed or performed during the hospital encounter of 06/27/16 (from the past 48 hour(s))  Hemoglobin A1c     Status: None   Collection Time: 06/29/16  3:54 PM   Result Value Ref Range   Hgb A1c MFr Bld 5.4 4.8 - 5.6 %    Comment: (NOTE)         Pre-diabetes: 5.7 - 6.4         Diabetes: >6.4         Glycemic control for adults with diabetes: <7.0    Mean Plasma Glucose 108 mg/dL    Comment: (NOTE) Performed At: [facility] [place], [place] [id number] Lindon Romp MD [id number]   Surgical PCR screen     Status: None   Collection Time: 06/30/16 12:15 AM  Result Value Ref Range   MRSA, PCR NEGATIVE NEGATIVE   Staphylococcus aureus NEGATIVE NEGATIVE    Comment:        The Xpert SA Assay (FDA approved for NASAL specimens in patients over 38 years of age), is one component of a comprehensive surveillance program.  Test performance has been validated by [facility] for patients greater than or equal to 18 year old. It is not intended to diagnose infection nor to guide or monitor treatment.   Basic metabolic panel     Status: Abnormal   Collection Time: 06/30/16  4:03 AM  Result Value Ref Range   Sodium 138 135 - 145 mmol/L   Potassium 3.2 (L) 3.5 - 5.1 mmol/L   Chloride 116 (H) 101 - 111 mmol/L   CO2 19 (L) 22 - 32 mmol/L   Glucose, Bld 101 (H) 65 - 99 mg/dL   BUN 11 6 - 20 mg/dL   Creatinine, Ser 0.72 0.44 - 1.00 mg/dL   Calcium 8.6 (L) 8.9 - 10.3 mg/dL   GFR calc non Af Amer >60 >60 mL/min   GFR calc Af Amer >60 >60 mL/min    Comment: (NOTE) The eGFR has been calculated using the CKD EPI equation. This calculation has not been validated in all clinical situations. eGFR's persistently <60 mL/min signify possible Chronic Kidney Disease.    Anion gap 3 (L) 5 - 15  CBC     Status: Abnormal   Collection Time: 06/30/16  4:03 AM  Result Value Ref Range   WBC 9.6 4.0 - 10.5 K/uL   RBC 3.11 (L) 3.87 - 5.11 MIL/uL   Hemoglobin 8.7 (L) 12.0 - 15.0 g/dL   HCT 26.4 (L) 36.0 - 46.0 %   MCV 84.9 78.0 - 100.0 fL   MCH 28.0 26.0 - 34.0 pg   MCHC 33.0 30.0 - 36.0 g/dL   RDW 13.9 11.5 - 15.5 %   Platelets  100 (L) 150 - 400 K/uL    Comment: CONSISTENT WITH PREVIOUS RESULT  Magnesium     Status: Abnormal   Collection Time: 06/30/16  4:03 AM  Result Value Ref Range   Magnesium 1.5 (L) 1.7 - 2.4 mg/dL  Type and screen [place]     Status: None   Collection Time: 06/30/16  9:51 AM  Result Value Ref Range   ABO/RH(D) A POS    Antibody Screen NEG    Sample Expiration 07/03/2016   Glucose, capillary     Status: Abnormal   Collection Time: 06/30/16  1:40 PM  Result Value Ref Range   Glucose-Capillary 171 (H) 65 - 99 mg/dL   Comment 1 Notify RN  Comment 2 Document in Chart   Basic metabolic panel     Status: Abnormal   Collection Time: 07/01/16  4:19 AM  Result Value Ref Range   Sodium 137 135 - 145 mmol/L   Potassium 3.9 3.5 - 5.1 mmol/L    Comment: RESULT REPEATED AND VERIFIED DELTA CHECK NOTED NO VISIBLE HEMOLYSIS    Chloride 113 (H) 101 - 111 mmol/L   CO2 21 (L) 22 - 32 mmol/L   Glucose, Bld 146 (H) 65 - 99 mg/dL   BUN 9 6 - 20 mg/dL   Creatinine, Ser 0.99 0.44 - 1.00 mg/dL   Calcium 8.7 (L) 8.9 - 10.3 mg/dL   GFR calc non Af Amer 51 (L) >60 mL/min   GFR calc Af Amer 59 (L) >60 mL/min    Comment: (NOTE) The eGFR has been calculated using the CKD EPI equation. This calculation has not been validated in all clinical situations. eGFR's persistently <60 mL/min signify possible Chronic Kidney Disease.    Anion gap 3 (L) 5 - 15  Magnesium     Status: Abnormal   Collection Time: 07/01/16  4:19 AM  Result Value Ref Range   Magnesium 1.3 (L) 1.7 - 2.4 mg/dL  Glucose, capillary     Status: None   Collection Time: 07/01/16  7:57 AM  Result Value Ref Range   Glucose-Capillary 65 65 - 99 mg/dL    Imaging / Studies: No results found.  Medications / Allergies: per chart  Antibiotics: Anti-infectives    Start     Dose/Rate Route Frequency Ordered Stop   06/30/16 2200  cefoTEtan (CEFOTAN) 2 g in dextrose 5 % 50 mL IVPB     2 g 100 mL/hr over 30 Minutes  Intravenous Every 12 hours 06/30/16 1806 06/30/16 2203   06/30/16 1215  clindamycin (CLEOCIN) 900 mg, gentamicin (GARAMYCIN) 240 mg in sodium chloride 0.9 % 1,000 mL for intraperitoneal lavage  Status:  Discontinued       As needed 06/30/16 1257 06/30/16 1333   06/30/16 0600  cefoTEtan (CEFOTAN) 2 g in dextrose 5 % 50 mL IVPB     2 g 100 mL/hr over 30 Minutes Intravenous On call to O.R. 06/29/16 1456 06/30/16 1035   06/30/16 0600  clindamycin (CLEOCIN) 900 mg, gentamicin (GARAMYCIN) 240 mg in sodium chloride 0.9 % 1,000 mL for intraperitoneal lavage    Comments:  Have in the  OR room for final irrigation in bowel surgery case to minimize risk of abscess/infection Pharmacy may adjust dosing strength, schedule, rate of infusion, etc as needed to optimize therapy    Intraperitoneal On call to O.R. 06/29/16 1456 07/01/16 0559   06/29/16 1830  metroNIDAZOLE (FLAGYL) tablet 1,000 mg     1,000 mg Oral 3 times per day on Mon Tue 06/29/16 1825 06/30/16 0141   06/29/16 1830  neomycin (MYCIFRADIN) tablet 1,000 mg     1,000 mg Oral 3 times per day on Mon Tue 06/29/16 1825 06/30/16 0141   06/29/16 1530  neomycin (MYCIFRADIN) tablet 1,000 mg  Status:  Discontinued     1,000 mg Oral 3 times per day 06/29/16 1458 06/29/16 1825   06/29/16 1530  metroNIDAZOLE (FLAGYL) tablet 1,000 mg  Status:  Discontinued     1,000 mg Oral 3 times per day 06/29/16 1458 06/29/16 1825   06/29/16 1500  neomycin (MYCIFRADIN) tablet 1,000 mg  Status:  Discontinued     1,000 mg Oral 3 times per day 06/29/16 1456 06/29/16 1458   06/29/16  1500  metroNIDAZOLE (FLAGYL) tablet 1,000 mg  Status:  Discontinued     1,000 mg Oral 3 times per day 06/29/16 1456 06/29/16 1458   06/29/16 1500  clindamycin (CLEOCIN) 900 mg, gentamicin (GARAMYCIN) 240 mg in sodium chloride 0.9 % 1,000 mL for intraperitoneal lavage  Status:  Discontinued     1 application Intraperitoneal To Surgery 06/29/16 1456 06/29/16 1521        Note: Portions of this  report may have been transcribed using voice recognition software. Every effort was made to ensure accuracy; however, inadvertent computerized transcription errors may be present.   Any transcriptional errors that result from this process are unintentional.     [name], M.D., F.A.C.S. Gastrointestinal and Minimally Invasive Surgery [facility] [address], [place], [place] [address] [phone] Main / Paging   07/01/2016

## 2016-07-02 LAB — BASIC METABOLIC PANEL
Anion gap: 2 — ABNORMAL LOW (ref 5–15)
BUN: 10 mg/dL (ref 6–20)
CHLORIDE: 111 mmol/L (ref 101–111)
CO2: 20 mmol/L — AB (ref 22–32)
CREATININE: 0.85 mg/dL (ref 0.44–1.00)
Calcium: 8.3 mg/dL — ABNORMAL LOW (ref 8.9–10.3)
GFR calc Af Amer: >60 mL/min (ref >60)
GFR calc non Af Amer: >60 mL/min (ref >60)
Glucose, Bld: 64 mg/dL — ABNORMAL LOW (ref 65–99)
Potassium: 3.8 mmol/L (ref 3.5–5.1)
SODIUM: 133 mmol/L — AB (ref 135–145)

## 2016-07-02 LAB — CREATININE, SERUM
CREATININE: 0.87 mg/dL (ref 0.44–1.00)
GFR calc Af Amer: >60 mL/min (ref >60)
GFR, EST NON AFRICAN AMERICAN: 59 mL/min — AB (ref >60)

## 2016-07-02 LAB — MAGNESIUM
MAGNESIUM: 1.6 mg/dL — AB (ref 1.7–2.4)
Magnesium: 1.7 mg/dL (ref 1.7–2.4)

## 2016-07-02 LAB — POTASSIUM: Potassium: 3.8 mmol/L (ref 3.5–5.1)

## 2016-07-02 LAB — HEMOGLOBIN: Hemoglobin: 8 g/dL — ABNORMAL LOW (ref 12.0–15.0)

## 2016-07-02 MED ORDER — PHENOL 1.4 % MT LIQD
2.0000 | OROMUCOSAL | Status: DC | PRN
  Filled 2016-07-02: qty 177

## 2016-07-02 MED ORDER — SACCHAROMYCES BOULARDII 250 MG PO CAPS
250.0000 mg | ORAL_CAPSULE | Freq: Two times a day (BID) | ORAL | Status: DC
  Administered 2016-07-02 – 2016-07-03 (×3): 250 mg via ORAL
  Filled 2016-07-02 (×3): qty 1

## 2016-07-02 MED ORDER — METHOCARBAMOL 500 MG PO TABS: 1000.0000 mg | ORAL_TABLET | Freq: Four times a day (QID) | ORAL | Status: DC | PRN

## 2016-07-02 MED ORDER — TRAMADOL HCL 50 MG PO TABS
50.0000 mg | ORAL_TABLET | Freq: Four times a day (QID) | ORAL | Status: DC | PRN
  Administered 2016-07-02 – 2016-07-03 (×2): 50 mg via ORAL
  Filled 2016-07-02 (×2): qty 1

## 2016-07-02 MED ORDER — SODIUM CHLORIDE 0.9% FLUSH: 3.0000 mL | INTRAVENOUS | Status: DC | PRN

## 2016-07-02 MED ORDER — MENTHOL 3 MG MT LOZG
1.0000 | LOZENGE | OROMUCOSAL | Status: DC | PRN
  Filled 2016-07-02: qty 9

## 2016-07-02 MED ORDER — SODIUM CHLORIDE 0.9 % IV SOLN: 250.0000 mL | INTRAVENOUS | Status: DC | PRN

## 2016-07-02 MED ORDER — MAGIC MOUTHWASH
15.0000 mL | Freq: Four times a day (QID) | ORAL | Status: DC | PRN
  Filled 2016-07-02: qty 15

## 2016-07-02 MED ORDER — PROCHLORPERAZINE EDISYLATE 5 MG/ML IJ SOLN: 5.0000 mg | INTRAMUSCULAR | Status: DC | PRN

## 2016-07-02 MED ORDER — ALUM & MAG HYDROXIDE-SIMETH 200-200-20 MG/5ML PO SUSP: 30.0000 mL | Freq: Four times a day (QID) | ORAL | Status: DC | PRN

## 2016-07-02 MED ORDER — MAGNESIUM SULFATE 2 GM/50ML IV SOLN
2.0000 g | Freq: Once | INTRAVENOUS | Status: AC
  Administered 2016-07-02: 2 g via INTRAVENOUS
  Filled 2016-07-02: qty 50

## 2016-07-02 MED ORDER — SODIUM CHLORIDE 0.9% FLUSH
3.0000 mL | Freq: Two times a day (BID) | INTRAVENOUS | Status: DC
Start: 2016-07-02 — End: 2016-07-03
  Administered 2016-07-02 – 2016-07-03 (×3): 3 mL via INTRAVENOUS

## 2016-07-02 MED ORDER — POLYETHYLENE GLYCOL 3350 17 G PO PACK
17.0000 g | PACK | Freq: Every day | ORAL | Status: DC
  Administered 2016-07-02 – 2016-07-03 (×2): 17 g via ORAL
  Filled 2016-07-02 (×2): qty 1

## 2016-07-02 MED ORDER — TRAMADOL HCL 50 MG PO TABS: 50.0000 mg | ORAL_TABLET | Freq: Four times a day (QID) | ORAL | Status: DC | PRN

## 2016-07-02 MED ORDER — LACTATED RINGERS IV BOLUS (SEPSIS): 1000.0000 mL | Freq: Three times a day (TID) | INTRAVENOUS | Status: DC | PRN

## 2016-07-02 MED ORDER — TAB-A-VITE/IRON PO TABS
1.0000 | ORAL_TABLET | Freq: Every day | ORAL | Status: DC
  Administered 2016-07-02 – 2016-07-03 (×2): 1 via ORAL
  Filled 2016-07-02 (×2): qty 1

## 2016-07-02 MED ORDER — LIP MEDEX EX OINT
1.0000 "application " | TOPICAL_OINTMENT | Freq: Two times a day (BID) | CUTANEOUS | Status: DC
  Administered 2016-07-02 – 2016-07-03 (×3): 1 via TOPICAL
  Filled 2016-07-02: qty 7

## 2016-07-02 NOTE — Evaluation (Signed)
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 She had assistance for adls at baseline and min guard assistance for walking to bathroom.  Pt will benefit from continued OT to work towards baseline functioning.  She currently needs min to mod A and goals are for min guard.    Follow Up Recommendations  SNF    Equipment Recommendations  None recommended by OT    Recommendations for Other Services       Precautions / Restrictions Precautions Precautions: Fall Restrictions Weight Bearing Restrictions: No      Mobility Bed Mobility           Sit to supine: Mod assist;HOB elevated   General bed mobility comments: assist for legs  Transfers   Equipment used: Rolling walker (2 wheeled) Transfers: Sit to/from Stand Sit to Stand: Min assist;Mod assist         General transfer comment: initially needed more assistance to rise from recliner (mod) then min A from 3:1 commode.  Cues for UE placement    Balance           Standing balance support: Single extremity supported Standing balance-Leahy Scale: Poor                              ADL Overall ADL's : Needs assistance/impaired     Grooming: Set up;Sitting                   Toilet Transfer: Minimal assistance;Moderate assistance;BSC;RW;Ambulation   Toileting- Clothing Manipulation and Hygiene: Minimal assistance;Sit to/from stand         General ADL Comments: pt needed more assistance to stand from recliner (mod A) than 3:1 commode (min A).   Had urgency and needed assistance to clean up and change socks.  Cues for safety as pt was rushing to toilet.  Pt has assistance for LB adls at baseline.     Vision     Perception     Praxis      Pertinent Vitals/Pain Pain Assessment: Faces Faces Pain Scale: Hurts even more Pain Location: abdomen when getting back to bed Pain Intervention(s): Limited activity within patient's tolerance;Monitored during session;Repositioned     Hand Dominance Right   Extremity/Trunk Assessment Upper Extremity Assessment Upper Extremity Assessment: Generalized weakness           Communication Communication Communication: No difficulties   Cognition Arousal/Alertness: Awake/alert Behavior During Therapy: WFL for tasks assessed/performed Overall Cognitive Status: Within Functional Limits for tasks assessed                     General Comments       Exercises       Shoulder Instructions      Home Living Family/patient expects to be discharged to:: Skilled nursing facility                                 Additional Comments: has all DME. Pt had PCA (and daughter when PCA was  off) to assist with bathing and dressing      Prior Functioning/Environment Level of Independence: Needs assistance    ADL's / Homemaking Assistance Needed: Aide assists patient with bathing and helps prepare meals.  Patient also receives mobile meals.        OT Diagnosis: Generalized weakness   OT Problem List: Decreased strength;Decreased activity tolerance;Impaired balance (sitting and/or standing);Pain   OT Treatment/Interventions: Self-care/ADL training;DME and/or AE instruction;Patient/family education;Balance training;Therapeutic activities    OT Goals(Current goals can be found in the care plan section) Acute Rehab OT Goals Patient Stated Goal: none stated; agreeable to therapy OT Goal Formulation: With patient Time For Goal Achievement: 07/09/16 Potential to Achieve  Goals: Good ADL Goals Pt Will Perform Grooming: with min guard assist;standing Pt Will Transfer to Toilet: with min guard assist;bedside commode;ambulating Pt Will Perform Toileting - Clothing Manipulation and hygiene: with min guard assist;sit to/from stand  OT Frequency: Min 2X/week   Barriers to D/C:            Co-evaluation PT/OT/SLP Co-Evaluation/Treatment: Yes Reason for Co-Treatment: For patient/therapist safety PT goals addressed during session: Mobility/safety with mobility OT goals addressed during session: ADL's and self-care      End of Session    Activity Tolerance: Patient limited by fatigue Patient left: in bed;with call bell/phone within reach;with bed alarm set   Time: [id number] OT Time Calculation (min): 23 min Charges:  OT General Charges $OT Visit: 1 Procedure OT Evaluation $OT Eval Moderate Complexity: 1 Procedure G-Codes:    Germain Koopmann 06-Jul-2016, 2:06 PM Lesle Chris, OTR/L [phone] 06-Jul-2016

## 2016-07-02 NOTE — Care Management Important Message (Signed)
Important Message  Patient Details  Name: Erika Day MRN: [id number] Date of Birth: 1929/12/27   Medicare Important Message Given:  Yes    Camillo Flaming 07/02/2016, 10:17 AMImportant Message  Patient Details  Name: Erika Day MRN: [id number] Date of Birth: 1930-05-08   Medicare Important Message Given:  Yes    Camillo Flaming 07/02/2016, 10:17 AM

## 2016-07-02 NOTE — NC FL2 (Signed)
[place] LEVEL OF CARE SCREENING TOOL     IDENTIFICATION  Patient Name: Erika Day Birthdate: February 01, 1930 Sex: female Admission Date (Current Location): 06/27/2016  [facility] and [place] Number:  Herbalist and Address:  [facility],  [place] [address] [place]      Provider Number: [id number]  Attending Physician Name and Address:  Lavina Hamman, MD  Relative Name and Phone Number:       Current Level of Care: Hospital Recommended Level of Care: [place] Prior Approval Number:    Date Approved/Denied:   PASRR Number: [id number]  Discharge Plan: SNF    Current Diagnoses: Patient Active Problem List   Diagnosis Date Noted  . Recurrent ventral incisional hernia s/p primary repair 06/30/2016 07/01/2016  . Pressure ulcer 05/20/2016  . Lower extremity edema 05/20/2016  . Protein-calorie malnutrition, severe ([place]) 05/20/2016  . Volvulus of sigmoid colon ([place])   . Sigmoid volvulus s/p lap sigmoid colectomy 06/30/2016 05/19/2016  . 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 Constipation 05/15/2016  . Other fatigue 05/15/2016  . CAP (community acquired pneumonia) 02/13/2016  . Anemia, unspecified 02/13/2016  . Thrombocytopenia ([place]) 02/13/2016  . Hyponatremia 02/13/2016  . Kyphosis 02/01/2016  . Pressure ulcer of coccygeal region 01/31/2016  . Mobility impaired 01/31/2016  . Osteoarthritis 01/31/2016  . Essential hypertension 09/06/2011  . Ovarian torsion 08/25/2011    Orientation RESPIRATION BLADDER Height & Weight     Self, Time, Situation, Place  Normal Continent Weight: 130 lb (59 kg) Height:  5\' 4"  (162.6 cm)  BEHAVIORAL SYMPTOMS/MOOD NEUROLOGICAL BOWEL NUTRITION STATUS   (NONE )  (NONE )   Diet (Clear Liquids )  AMBULATORY STATUS COMMUNICATION OF NEEDS Skin   Extensive Assist Verbally Surgical wounds                       Personal Care Assistance Level of Assistance  Bathing, Feeding, Dressing Bathing Assistance: Limited assistance Feeding assistance: Limited assistance Dressing Assistance: Limited assistance     Functional Limitations Info  Speech, Hearing, Sight Sight Info: Adequate Hearing Info: Adequate Speech Info: Adequate    SPECIAL CARE FACTORS FREQUENCY  PT (By licensed PT)     PT Frequency: 3              Contractures      Additional Factors Info  Code Status, Allergies Code Status Info: FULL CODE  Allergies Info: Morphine and related            Current Medications (07/02/2016):  This is the current hospital active medication list Current Facility-Administered Medications  Medication Dose Route Frequency Provider Last Rate Last Dose  . 0.9 %  sodium chloride infusion   Intravenous Continuous Lacretia Leigh, MD 125 mL/hr at 07/01/16 1729    . 0.9 %  sodium chloride infusion  250 mL Intravenous PRN Michael Boston, MD      . acetaminophen (TYLENOL) tablet 1,000 mg  1,000 mg Oral TID Michael Boston, MD   1,000 mg at 07/02/16 0808  . aspirin EC tablet 81 mg  81 mg Oral Daily Michael Boston, MD   81 mg at 07/01/16 0956  . enoxaparin (LOVENOX) injection 40 mg  40 mg Subcutaneous Q24H Debbe Odea, MD   40 mg at 07/01/16 1548  . feeding supplement (ENSURE ENLIVE) (ENSURE ENLIVE) liquid 237 mL  237 mL Oral BID  BM Debbe Odea, MD   237 mL at 07/01/16 0958  . fentaNYL (SUBLIMAZE) injection 50 mcg  50 mcg Intravenous Q2H PRN Toy Baker, MD   50 mcg at 07/02/16 0808  . labetalol (NORMODYNE,TRANDATE) injection 10 mg  10 mg Intravenous Q2H PRN Toy Baker, MD   10 mg at 06/29/16 2140  . labetalol (NORMODYNE,TRANDATE) injection 10 mg  10 mg Intravenous Q2H PRN Debbe Odea, MD      . lactated ringers bolus 1,000 mL  1,000 mL Intravenous Q8H PRN Michael Boston, MD      . lactated ringers bolus 1,000 mL  1,000 mL Intravenous Q8H PRN Michael Boston, MD      . loratadine (CLARITIN)  tablet 10 mg  10 mg Oral Daily Michael Boston, MD   10 mg at 07/01/16 [id number]  . methocarbamol (ROBAXIN) 1,000 mg in dextrose 5 % 50 mL IVPB  1,000 mg Intravenous Q6H PRN Michael Boston, MD      . ondansetron [facility]) tablet 4 mg  4 mg Oral Q6H PRN Toy Baker, MD       Or  . ondansetron (ZOFRAN) injection 4 mg  4 mg Intravenous Q6H PRN Toy Baker, MD   4 mg at 07/01/16 0545  . pantoprazole (PROTONIX) EC tablet 40 mg  40 mg Oral Daily Debbe Odea, MD   40 mg at 07/01/16 0956  . prednisoLONE acetate (PRED FORTE) 1 % ophthalmic suspension 2 drop  2 drop Right Eye BID Debbe Odea, MD   2 drop at 07/01/16 2107   And  . prednisoLONE acetate (PRED FORTE) 1 % ophthalmic suspension 1 drop  1 drop Left Eye BID Debbe Odea, MD   1 drop at 07/01/16 2107  . sertraline (ZOLOFT) tablet 25 mg  25 mg Oral Daily Debbe Odea, MD   25 mg at 07/01/16 0955  . sodium chloride flush (NS) 0.9 % injection 3 mL  3 mL Intravenous Q12H Toy Baker, MD   3 mL at 07/01/16 2200  . sodium chloride flush (NS) 0.9 % injection 3 mL  3 mL Intravenous Q12H Michael Boston, MD   3 mL at 07/01/16 1009  . sodium chloride flush (NS) 0.9 % injection 3 mL  3 mL Intravenous PRN Michael Boston, MD         Discharge Medications: Please see discharge summary for a list of discharge medications.  Relevant Imaging Results:  Relevant Lab Results:   Additional Information SSN [id number]  Rozell Searing

## 2016-07-02 NOTE — Clinical Social Work Note (Signed)
Clinical Social Work Assessment  Patient Details  Name: Erika Day MRN: [id number] Date of Birth: [birth year]  Date of referral:  07/02/16               Reason for consult:  Facility Placement, Discharge Planning                Permission sought to share information with:  Family Supports, Customer service manager, Case Optician, dispensing granted to share information::  Yes, Verbal Permission Granted  Name::      [name] and Melrose Nakayama )  Agency::   (SNF's )  Relationship::   (Daughter and Spouse )  Contact Information:   [phone])  Housing/Transportation Living arrangements for the past 2 months:  [place] of Information:  Patient, Adult Children Patient Interpreter Needed:  None Criminal Activity/Legal Involvement Pertinent to Current Situation/Hospitalization:  No - Comment as needed Significant Relationships:  Adult Children, Spouse Lives with:  Spouse Do you feel safe going back to the place where you live?  No Need for family participation in patient care:  No (Coment)  Care giving concerns: Patient requiring ST rehab at skilled level.    Social Worker assessment / plan: MSW met with patient and family (daughter, [name] and spouse, [name]) at bedside in reference to post acute placement for SNF. MSW introduced MSW role and SNF process.  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 Patient's dtr chooses [facility] as preference and [facility] as second option. Pt and spouse are agreeable to dc plan for SNF. Patient and family understands that insurance could potentially cover first 20 days at 100%.  MSW has contacted [facility] and [facility], facility plans to complete paperwork at [place] on 9/1. Time confirmed with dtr and dtr is agreeable. No further concerns reported at this time. MSW will continue to follow patient and pt's family for continued support and to  facilitate patient's dc needs once medically stable.   Employment status:  Retired Nurse, adult PT Recommendations:  [place] / Referral to community resources:  [place]  Patient/Family's Response to care: Pt a/o x4. Patient reported that she is feeling well and looking forward to ultimately returning home with family. Pt's spouse and dtr supportive and involved in care. Pt and family pleasant and appreciated social work intervention.   Patient/Family's Understanding of and Emotional Response to Diagnosis, Current Treatment, and Prognosis:  Pt's dtr knowledgeable of medical/surgical interventions.   Emotional Assessment Appearance:  Appears stated age Attitude/Demeanor/Rapport:   (Pleasant ) Affect (typically observed):  Accepting, Appropriate, Pleasant, Hopeful Orientation:  Oriented to Situation, Oriented to  Time, Oriented to Place, Oriented to Self Alcohol / Substance use:  Not Applicable Psych involvement (Current and /or in the community):  No (Comment)  Discharge Needs  Concerns to be addressed:  Denies Needs/Concerns at this time Readmission within the last 30 days:  No Current discharge risk:  Dependent with Mobility Barriers to Discharge:  Continued Medical Work up   [employer], MSW [phone] 07/02/2016 2:21 PM

## 2016-07-02 NOTE — Progress Notes (Signed)
[facility] Progress Note  Patient: Erika Day [id number]   PCP: Philis Fendt, MD DOB: Jul 03, 1930   DOA: 06/27/2016   DOS: 07/02/2016   Date of Service: the patient was seen and examined on 07/02/2016  Subjective: Denies any acute complaint. Nutrition: Tolerating clears.  Brief hospital course: Pt. with PMH of DM, HTN, non-Hodgkin's B-cell lymphoma, CVA, GERD; admitted on 06/27/2016, with complaint of nausea and vomiting, was found to have sigmoid volvulus. Patient underwent flexible sigmoidoscopy on 06/28/2016 with gastroenterology. Gen. surgery was consulted and patient underwent laparoscopic sigmoid colectomy on 06/30/2016 for recurrent sigmoid volvulus. Currently further plan is continue monitoring for postoperative recovery.  Assessment and Plan: 1. Sigmoid volvulus ([place]) S/p flexible sigmoidoscopy by gastroenterology 06/28/2016. S/P laparoscopic sigmoid colectomy and primary repair of recurrent incisional hernia 06/30/2016 by general surgery. Currently on clear liquid diet. Continue to follow recommendation from surgery. GI currently signed off.  2. Hypomagnesemia. Continue daily monitoring.  3. Hypokalemia. Continue to monitor  4. Non-Hodgkin's B-cell lymphoma. Currently not on any treatment. Surgical note Mentions no metastasis.  5. Protein calorie malnutrition. Severe. We'll continue to monitor for recovery of GI function and then initiate supplementation.  6. Bicytopenia. We'll continue to closely monitor.  Pain management: When necessary Robaxin, when necessary fentanyl Activity: Consulted physical therapy Bowel regimen: last BM 06/29/2016 Diet: Clear liquid diet DVT Prophylaxis: subcutaneous Heparin  Advance goals of care discussion: Full code  Family Communication: no family was present at bedside, at the time of interview.  Disposition:  Discharge to home. Expected discharge date: 07/03/2016,   Consultants: Gastroenterology, general  surgery Procedures: Sigmoid colectomy, sigmoidoscopy  Antibiotics: Anti-infectives    Start     Dose/Rate Route Frequency Ordered Stop   06/30/16 2200  cefoTEtan (CEFOTAN) 2 g in dextrose 5 % 50 mL IVPB     2 g 100 mL/hr over 30 Minutes Intravenous Every 12 hours 06/30/16 1806 06/30/16 2203   06/30/16 1215  clindamycin (CLEOCIN) 900 mg, gentamicin (GARAMYCIN) 240 mg in sodium chloride 0.9 % 1,000 mL for intraperitoneal lavage  Status:  Discontinued       As needed 06/30/16 1257 06/30/16 1333   06/30/16 0600  cefoTEtan (CEFOTAN) 2 g in dextrose 5 % 50 mL IVPB     2 g 100 mL/hr over 30 Minutes Intravenous On call to O.R. 06/29/16 1456 06/30/16 1035   06/30/16 0600  clindamycin (CLEOCIN) 900 mg, gentamicin (GARAMYCIN) 240 mg in sodium chloride 0.9 % 1,000 mL for intraperitoneal lavage    Comments:  Have in the  OR room for final irrigation in bowel surgery case to minimize risk of abscess/infection Pharmacy may adjust dosing strength, schedule, rate of infusion, etc as needed to optimize therapy    Intraperitoneal On call to O.R. 06/29/16 1456 07/01/16 0559   06/29/16 1830  metroNIDAZOLE (FLAGYL) tablet 1,000 mg     1,000 mg Oral 3 times per day on Mon Tue 06/29/16 1825 06/30/16 0141   06/29/16 1830  neomycin (MYCIFRADIN) tablet 1,000 mg     1,000 mg Oral 3 times per day on Mon Tue 06/29/16 1825 06/30/16 0141   06/29/16 1530  neomycin (MYCIFRADIN) tablet 1,000 mg  Status:  Discontinued     1,000 mg Oral 3 times per day 06/29/16 1458 06/29/16 1825   06/29/16 1530  metroNIDAZOLE (FLAGYL) tablet 1,000 mg  Status:  Discontinued     1,000 mg Oral 3 times per day 06/29/16 1458 06/29/16 1825   06/29/16 1500  neomycin (MYCIFRADIN) tablet  1,000 mg  Status:  Discontinued     1,000 mg Oral 3 times per day 06/29/16 1456 06/29/16 1458   06/29/16 1500  metroNIDAZOLE (FLAGYL) tablet 1,000 mg  Status:  Discontinued     1,000 mg Oral 3 times per day 06/29/16 1456 06/29/16 1458   06/29/16 1500  clindamycin  (CLEOCIN) 900 mg, gentamicin (GARAMYCIN) 240 mg in sodium chloride 0.9 % 1,000 mL for intraperitoneal lavage  Status:  Discontinued     1 application Intraperitoneal To Surgery 06/29/16 1456 06/29/16 1521        Intake/Output Summary (Last 24 hours) at 07/02/16 1810 Last data filed at 07/02/16 1044  Gross per 24 hour  Intake              200 ml  Output             2325 ml  Net            -2125 ml   Filed Weights   06/27/16 2029  Weight: 59 kg (130 lb)    Objective: Physical Exam: Vitals:   07/01/16 2100 07/02/16 0529 07/02/16 1340 07/02/16 1345  BP: 138/72 132/69 (!) 154/64 (!) 154/64  Pulse: 79 65 65 65  Resp: 18 18 (!) 22 (!) 22  Temp: 98.2 F (36.8 C) 98.2 F (36.8 C) 98.3 F (36.8 C) 98.3 F (36.8 C)  TempSrc: Oral Oral Oral Oral  SpO2: 99% 99% 100% 100%  Weight:      Height:        General: Alert, Awake and Oriented to Time, Place and Person. Appear in mild distress Eyes: PERRL, Conjunctiva normal ENT: Oral Mucosa clear moist. Neck: no JVD, no Abnormal Mass Or lumps Cardiovascular: S1 and S2 Present, no Murmur, Respiratory: Bilateral Air entry equal and Decreased, Clear to Auscultation, no Crackles, no wheezes Abdomen: Bowel Sound present, Soft and no tenderness Skin: no redness, no Rash  Extremities: no Pedal edema, no calf tenderness Neurologic: Grossly no focal neuro deficit. Bilaterally Equal motor strength  Data Reviewed: CBC:  Recent Labs Lab 06/27/16 2133 06/28/16 0416 06/29/16 0345 06/30/16 0403 07/02/16 0401  WBC 11.7* 9.4 9.2 9.6  --   HGB 10.5* 9.6* 9.3* 8.7* 8.0*  HCT 32.2* 29.4* 27.9* 26.4*  --   MCV 86.8 85.0 85.8 84.9  --   PLT 131* 108* 104* 100*  --    Basic Metabolic Panel:  Recent Labs Lab 06/28/16 0416 06/29/16 0345 06/30/16 0403 07/01/16 0419 07/02/16 0401  NA 138 137 138 137 133*  K 3.3* 3.2* 3.2* 3.9 3.8  3.8  CL 110 112* 116* 113* 111  CO2 24 21* 19* 21* 20*  GLUCOSE 96 74 101* 146* 64*  BUN 18 14 11 9 10     CREATININE 0.87 0.92 0.72 0.99 0.85  0.87  CALCIUM 9.2 8.8* 8.6* 8.7* 8.3*  MG 1.8  --  1.5* 1.3* 1.6*  1.7  PHOS 3.0  --   --   --   --     Liver Function Tests:  Recent Labs Lab 06/27/16 2133 06/28/16 0416  AST 19 16  ALT 10* 11*  ALKPHOS 71 62  BILITOT 0.5 0.6  PROT 7.8 6.8  ALBUMIN 3.9 3.3*    Recent Labs Lab 06/27/16 2133  LIPASE 43   No results for input(s): AMMONIA in the last 168 hours. Coagulation Profile: No results for input(s): INR, PROTIME in the last 168 hours. Cardiac Enzymes: No results for input(s): CKTOTAL, CKMB, CKMBINDEX, TROPONINI in the  last 168 hours. BNP (last 3 results) No results for input(s): PROBNP in the last 8760 hours.  CBG:  Recent Labs Lab 06/28/16 0829 06/28/16 1151 06/30/16 1340 07/01/16 0757  GLUCAP 94 79 171* 65    Studies: No results found.   Scheduled Meds: . acetaminophen  1,000 mg Oral TID  . aspirin EC  81 mg Oral Daily  . enoxaparin (LOVENOX) injection  40 mg Subcutaneous Q24H  . feeding supplement (ENSURE ENLIVE)  237 mL Oral BID BM  . lip balm  1 application Topical BID  . loratadine  10 mg Oral Daily  . multivitamins with iron  1 tablet Oral Daily  . pantoprazole  40 mg Oral Daily  . polyethylene glycol  17 g Oral Daily  . prednisoLONE acetate  2 drop Right Eye BID   And  . prednisoLONE acetate  1 drop Left Eye BID  . saccharomyces boulardii  250 mg Oral BID  . sertraline  25 mg Oral Daily  . sodium chloride flush  3 mL Intravenous Q12H  . sodium chloride flush  3 mL Intravenous Q12H  . sodium chloride flush  3 mL Intravenous Q12H   Continuous Infusions:   PRN Meds: sodium chloride, sodium chloride, alum & mag hydroxide-simeth, labetalol, lactated ringers, magic mouthwash, menthol-cetylpyridinium, methocarbamol (ROBAXIN)  IV, methocarbamol, ondansetron **OR** ondansetron (ZOFRAN) IV, phenol, prochlorperazine, sodium chloride flush, sodium chloride flush, traMADol  Time spent: 30  minutes  Author: Berle Mull, MD [facility] Pager: [phone] 07/02/2016 6:10 PM  If 7PM-7AM, please contact night-coverage at [email or web address], password [email or web address]

## 2016-07-02 NOTE — Progress Notes (Signed)
[address] [place], [place] [address] Phone: [phone] FAX: [phone]   Erika Day [id number] Oct 09, 1930  CARE TEAM:  PCP: Philis Fendt, MD  Outpatient Care Team: Patient Care Team: Nolene Ebbs, MD as PCP - General (Internal Medicine) Nolene Ebbs, MD (Internal Medicine) [name], MD as Consulting Physician (Gastroenterology)  Inpatient Treatment Team: Treatment Team: Attending Provider: Lavina Hamman, MD; Registered Nurse: Dolores Patty, RN; Rounding Team: Joycelyn Das, MD; Technician: Suzanna Obey, NT; Registered Nurse: Bayard Hugger, RN; Consulting Physician: Nolon Nations, MD; Registered Nurse: Don Perking, RN; Registered Nurse: Thermon Leyland, RN; Registered Nurse: Lottie Dawson, RN; Registered Nurse: Cheryle Horsfall, RN; Technician: Gardiner Ramus, NT; Registered Nurse: Nilda Calamity, RN (Inactive); Physical Therapist: Junius Argyle, PT; Technician: Toribio Harbour, NT; Occupational Therapist: Lesle Chris, OT  Problem List:   Principal Problem:   Sigmoid volvulus s/p lap sigmoid colectomy 06/30/2016 Active Problems:   Essential hypertension   Anemia, unspecified   Thrombocytopenia ([place])   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 ([place])   Protein-calorie malnutrition, severe ([place])   Recurrent ventral incisional hernia s/p primary repair 06/30/2016  06/28/2016  Procedure(s): FLEXIBLE SIGMOIDOSCOPY  06/30/2016:  POST-OPERATIVE DIAGNOSIS:    Recurrent sigmoid volvulus Recurrent incisional hernia  PROCEDURE:  LAPAROSCOPIC SIGMOID COLECTOMY Rigid sigmoidoscopy,  Primary repair recurrent incisional hernia  SURGEON:  Surgeon(s): Michael Boston, MD  ASSISTANT: Alphonsa Overall, MD    Assessment  Recurrent sigmoid volvulus s/p sigmoid colectomy  Plan:  -liquids - adv diet gradually per protocol -replace low Mag -follow off IVF as tolerated w backup  IVF boluses -VTE prophylaxis- SCDs, etc -mobilize as tolerated to help recovery - get her up  I updated the patient's status to the patient and family.  Recommendations were made.  Questions were answered.  They expressed understanding & appreciation.   [name], M.D., F.A.C.S. Gastrointestinal and Minimally Invasive Surgery [facility] [address], [place], [employer] [place] [address] [phone] Main / Paging   07/02/2016  Subjective:  Less pain In chair Resisting walking in hallways Daughter in room  Objective:  Vital signs:  Vitals:   07/01/16 0518 07/01/16 1508 07/01/16 2100 07/02/16 0529  BP: (!) 114/56 (!) 110/53 138/72 132/69  Pulse: 60 78 79 65  Resp: 16 18 18 18   Temp: 97.8 F (36.6 C) 97.9 F (36.6 C) 98.2 F (36.8 C) 98.2 F (36.8 C)  TempSrc: Oral Oral Oral Oral  SpO2: 100% 96% 99% 99%  Weight:      Height:        Last BM Date: 07/01/16  Intake/Output   Yesterday:  08/30 0701 - 08/31 0700 In: 2354 [P.O.:810; I.V.:1544] Out: 2650 [Urine:2650] This shift:  No intake/output data recorded.  Bowel function:  Flatus: YES  BM:  YES  Drain: (No drain)   Physical Exam:  General: Pt awake/alert/oriented x4 in No acute distress Eyes: PERRL, normal EOM.  Sclera clear.  No icterus Neuro: CN II-XII intact w/o focal sensory/motor deficits. Lymph: No head/neck/groin lymphadenopathy Psych:  No delerium/psychosis/paranoia.  Some long-term memory fair HENT: Normocephalic, Mucus membranes moist.  No thrush Neck: Supple, No tracheal deviation Chest: No chest wall pain w good excursion CV:  Pulses intact.  Regular rhythm MS: Normal AROM mjr joints.  No obvious deformity Abdomen: Soft.  Nondistended.  Mildly tender at incisions only.  No evidence of peritonitis.  No incarcerated  hernias. Ext:  SCDs BLE.  No mjr edema.  No cyanosis Skin: No petechiae / purpura  Results:   Labs: Results for orders placed or  performed during the hospital encounter of 06/27/16 (from the past 48 hour(s))  Glucose, capillary     Status: Abnormal   Collection Time: 06/30/16  1:40 PM  Result Value Ref Range   Glucose-Capillary 171 (H) 65 - 99 mg/dL   Comment 1 Notify RN    Comment 2 Document in Chart   Basic metabolic panel     Status: Abnormal   Collection Time: 07/01/16  4:19 AM  Result Value Ref Range   Sodium 137 135 - 145 mmol/L   Potassium 3.9 3.5 - 5.1 mmol/L    Comment: RESULT REPEATED AND VERIFIED DELTA CHECK NOTED NO VISIBLE HEMOLYSIS    Chloride 113 (H) 101 - 111 mmol/L   CO2 21 (L) 22 - 32 mmol/L   Glucose, Bld 146 (H) 65 - 99 mg/dL   BUN 9 6 - 20 mg/dL   Creatinine, Ser 0.99 0.44 - 1.00 mg/dL   Calcium 8.7 (L) 8.9 - 10.3 mg/dL   GFR calc non Af Amer 51 (L) >60 mL/min   GFR calc Af Amer 59 (L) >60 mL/min    Comment: (NOTE) The eGFR has been calculated using the CKD EPI equation. This calculation has not been validated in all clinical situations. eGFR's persistently <60 mL/min signify possible Chronic Kidney Disease.    Anion gap 3 (L) 5 - 15  Magnesium     Status: Abnormal   Collection Time: 07/01/16  4:19 AM  Result Value Ref Range   Magnesium 1.3 (L) 1.7 - 2.4 mg/dL  Glucose, capillary     Status: None   Collection Time: 07/01/16  7:57 AM  Result Value Ref Range   Glucose-Capillary 65 65 - 99 mg/dL  Magnesium     Status: None   Collection Time: 07/02/16  4:01 AM  Result Value Ref Range   Magnesium 1.7 1.7 - 2.4 mg/dL  Potassium     Status: None   Collection Time: 07/02/16  4:01 AM  Result Value Ref Range   Potassium 3.8 3.5 - 5.1 mmol/L  Creatinine, serum     Status: Abnormal   Collection Time: 07/02/16  4:01 AM  Result Value Ref Range   Creatinine, Ser 0.87 0.44 - 1.00 mg/dL   GFR calc non Af Amer 59 (L) >60 mL/min   GFR calc Af Amer >60 >60 mL/min    Comment: (NOTE) The eGFR has been calculated using the CKD EPI equation. This calculation has not been validated in all  clinical situations. eGFR's persistently <60 mL/min signify possible Chronic Kidney Disease.   Hemoglobin     Status: Abnormal   Collection Time: 07/02/16  4:01 AM  Result Value Ref Range   Hemoglobin 8.0 (L) 12.0 - 15.0 g/dL  Basic metabolic panel     Status: Abnormal   Collection Time: 07/02/16  4:01 AM  Result Value Ref Range   Sodium 133 (L) 135 - 145 mmol/L    Comment: REPEATED TO VERIFY   Potassium 3.8 3.5 - 5.1 mmol/L   Chloride 111 101 - 111 mmol/L    Comment: REPEATED TO VERIFY   CO2 20 (L) 22 - 32 mmol/L    Comment: REPEATED TO VERIFY   Glucose, Bld 64 (L) 65 - 99 mg/dL   BUN 10 6 - 20 mg/dL   Creatinine, Ser 0.85 0.44 - 1.00 mg/dL  Calcium 8.3 (L) 8.9 - 10.3 mg/dL   GFR calc non Af Amer >60 >60 mL/min   GFR calc Af Amer >60 >60 mL/min    Comment: (NOTE) The eGFR has been calculated using the CKD EPI equation. This calculation has not been validated in all clinical situations. eGFR's persistently <60 mL/min signify possible Chronic Kidney Disease.    Anion gap 2 (L) 5 - 15    Comment: REPEATED TO VERIFY  Magnesium     Status: Abnormal   Collection Time: 07/02/16  4:01 AM  Result Value Ref Range   Magnesium 1.6 (L) 1.7 - 2.4 mg/dL    Imaging / Studies: No results found.  Medications / Allergies: per chart  Antibiotics: Anti-infectives    Start     Dose/Rate Route Frequency Ordered Stop   06/30/16 2200  cefoTEtan (CEFOTAN) 2 g in dextrose 5 % 50 mL IVPB     2 g 100 mL/hr over 30 Minutes Intravenous Every 12 hours 06/30/16 1806 06/30/16 2203   06/30/16 1215  clindamycin (CLEOCIN) 900 mg, gentamicin (GARAMYCIN) 240 mg in sodium chloride 0.9 % 1,000 mL for intraperitoneal lavage  Status:  Discontinued       As needed 06/30/16 1257 06/30/16 1333   06/30/16 0600  cefoTEtan (CEFOTAN) 2 g in dextrose 5 % 50 mL IVPB     2 g 100 mL/hr over 30 Minutes Intravenous On call to O.R. 06/29/16 1456 06/30/16 1035   06/30/16 0600  clindamycin (CLEOCIN) 900 mg, gentamicin  (GARAMYCIN) 240 mg in sodium chloride 0.9 % 1,000 mL for intraperitoneal lavage    Comments:  Have in the  OR room for final irrigation in bowel surgery case to minimize risk of abscess/infection Pharmacy may adjust dosing strength, schedule, rate of infusion, etc as needed to optimize therapy    Intraperitoneal On call to O.R. 06/29/16 1456 07/01/16 0559   06/29/16 1830  metroNIDAZOLE (FLAGYL) tablet 1,000 mg     1,000 mg Oral 3 times per day on Mon Tue 06/29/16 1825 06/30/16 0141   06/29/16 1830  neomycin (MYCIFRADIN) tablet 1,000 mg     1,000 mg Oral 3 times per day on Mon Tue 06/29/16 1825 06/30/16 0141   06/29/16 1530  neomycin (MYCIFRADIN) tablet 1,000 mg  Status:  Discontinued     1,000 mg Oral 3 times per day 06/29/16 1458 06/29/16 1825   06/29/16 1530  metroNIDAZOLE (FLAGYL) tablet 1,000 mg  Status:  Discontinued     1,000 mg Oral 3 times per day 06/29/16 1458 06/29/16 1825   06/29/16 1500  neomycin (MYCIFRADIN) tablet 1,000 mg  Status:  Discontinued     1,000 mg Oral 3 times per day 06/29/16 1456 06/29/16 1458   06/29/16 1500  metroNIDAZOLE (FLAGYL) tablet 1,000 mg  Status:  Discontinued     1,000 mg Oral 3 times per day 06/29/16 1456 06/29/16 1458   06/29/16 1500  clindamycin (CLEOCIN) 900 mg, gentamicin (GARAMYCIN) 240 mg in sodium chloride 0.9 % 1,000 mL for intraperitoneal lavage  Status:  Discontinued     1 application Intraperitoneal To Surgery 06/29/16 1456 06/29/16 1521        Note: Portions of this report may have been transcribed using voice recognition software. Every effort was made to ensure accuracy; however, inadvertent computerized transcription errors may be present.   Any transcriptional errors that result from this process are unintentional.     [name], M.D., F.A.C.S. Gastrointestinal and Minimally Invasive Surgery [facility] [phone]  [address] [place], [place] [address] [phone] Main / Paging   07/02/2016

## 2016-07-02 NOTE — Clinical Social Work Placement (Signed)
   CLINICAL SOCIAL WORK PLACEMENT  NOTE  Date:  07/02/2016  Patient Details  Name: FALYNN WESELY MRN: [id number] Date of Birth: 12-Dec-1929  Clinical Social Work is seeking post-discharge placement for this patient at the [place] level of care (*CSW will initial, date and re-position this form in  chart as items are completed):  Yes   Patient/family provided with [facility] Work Department's list of facilities offering this level of care within the geographic area requested by the patient (or if unable, by the patient's family).  Yes   Patient/family informed of their freedom to choose among providers that offer the needed level of care, that participate in Medicare, Medicaid or managed care program needed by the patient, have an available bed and are willing to accept the patient.  Yes   Patient/family informed of [facility]'s ownership interest in [facility] and [facility], as well as of the fact that they are under no obligation to receive care at these facilities.  PASRR submitted to EDS on 07/02/16     PASRR number received on 07/02/16     Existing PASRR number confirmed on       FL2 transmitted to all facilities in geographic area requested by pt/family on 07/02/16     FL2 transmitted to all facilities within larger geographic area on       Patient informed that his/her managed care company has contracts with or will negotiate with certain facilities, including the following:        Yes   Patient/family informed of bed offers received.  Patient chooses bed at  [facility] and [facility] )     Physician recommends and patient chooses bed at      Patient to be transferred to  [facility] on  .  Patient to be transferred to facility by       Patient family notified on   of transfer.  Name of family member notified:   (Pt's dtr, [name] )     PHYSICIAN Please sign FL2     Additional  Comment:    _______________________________________________ Glendon Axe A 07/02/2016, 2:23 PM

## 2016-07-02 NOTE — Progress Notes (Signed)
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  Pt fatigues quickly so recliner following for safety.  Continue to recommend d/c to SNF.  Follow Up Recommendations  Supervision/Assistance - 24 hour;SNF     Equipment Recommendations  None recommended by PT    Recommendations for Other Services       Precautions / Restrictions Precautions Precautions: Fall Restrictions Weight Bearing Restrictions: No    Mobility  Bed Mobility           Sit to supine: Mod assist;HOB elevated   General bed mobility comments: assist for legs  Transfers Overall transfer level: Needs assistance Equipment used: Rolling walker (2 wheeled) Transfers: Sit to/from Stand Sit to Stand: Min assist;Mod assist         General transfer comment: initially needed more assistance to rise from recliner (mod) then min A from 3:1 commode.  Cues for UE placement, improved stability upon rise today compared to yesterday  Ambulation/Gait Ambulation/Gait assistance: Min assist;+2 safety/equipment Ambulation Distance (Feet): 30 Feet Assistive device: Rolling walker (2 wheeled) Gait Pattern/deviations: Step-through pattern;Trunk flexed;Decreased stride length     General Gait Details: verbal cues for posture and RW positioning, encouraged increased distance however pt fatigues quickly, recliner following   Stairs            Wheelchair Mobility    Modified Rankin (Stroke Patients Only)       Balance            Standing balance support: Single extremity supported Standing balance-Leahy Scale: Poor                      Cognition Arousal/Alertness: Awake/alert Behavior During Therapy: WFL for tasks assessed/performed Overall Cognitive Status: Within Functional Limits for tasks assessed                      Exercises      General Comments        Pertinent Vitals/Pain Pain Assessment: Faces Faces Pain Scale: Hurts even more Pain Location: abdomen Pain Descriptors / Indicators: Sore Pain Intervention(s): Limited activity within patient's tolerance;Monitored during session;Repositioned    Home Living Family/patient expects to be discharged to:: Skilled nursing facility               Additional Comments: has all DME. Pt had PCA (and daughter when PCA was off) to assist with bathing and dressing    Prior Function Level of Independence: Needs assistance    ADL's / Homemaking Assistance Needed: Aide assists patient with bathing and helps prepare meals.  Patient also receives mobile meals.     PT Goals (current goals can now be found in the care plan section) Acute Rehab PT Goals Patient Stated Goal: none stated; agreeable to therapy Progress towards PT goals: Progressing toward goals    Frequency  Min 3X/week    PT Plan Current plan remains appropriate    Co-evaluation   Reason for Co-Treatment: For patient/therapist safety PT goals addressed during session: Mobility/safety with mobility OT goals addressed during session: ADL's and self-care  End of Session Equipment Utilized During Treatment: Gait belt Activity Tolerance: Patient limited by fatigue Patient left: with call bell/phone within reach;in bed;with bed alarm set     Time: [id number] PT Time Calculation (min) (ACUTE ONLY): 22 min  Charges:  $Gait Training: 8-22 mins                    G Codes:      Tahje Borawski,KATHrine E Jul 03, 2016, 2:46 PM Carmelia Bake, PT, DPT 07-03-2016 Pager:  [phone]

## 2016-07-03 MED ORDER — HYDRALAZINE HCL 25 MG PO TABS: 25.0000 mg | ORAL_TABLET | Freq: Three times a day (TID) | ORAL | 0 refills | Status: DC

## 2016-07-03 MED ORDER — METHOCARBAMOL 500 MG PO TABS: 500.0000 mg | ORAL_TABLET | Freq: Four times a day (QID) | ORAL | 0 refills | Status: DC | PRN

## 2016-07-03 MED ORDER — POLYETHYLENE GLYCOL 3350 17 G PO PACK: 17.0000 g | PACK | Freq: Two times a day (BID) | ORAL | Status: DC

## 2016-07-03 MED ORDER — HYDRALAZINE HCL 25 MG PO TABS
25.0000 mg | ORAL_TABLET | Freq: Three times a day (TID) | ORAL | Status: DC
  Administered 2016-07-03: 25 mg via ORAL
  Filled 2016-07-03: qty 1

## 2016-07-03 MED ORDER — TRAMADOL HCL 50 MG PO TABS: 50.0000 mg | ORAL_TABLET | Freq: Four times a day (QID) | ORAL | 0 refills | Status: DC | PRN

## 2016-07-03 MED ORDER — ENSURE ENLIVE PO LIQD
237.0000 mL | Freq: Two times a day (BID) | ORAL | 12 refills | Status: DC
Start: 2016-07-03 — End: 2017-04-19

## 2016-07-03 NOTE — Progress Notes (Signed)
3 Days Post-Op  Subjective: Up walking some, didn't do as well today as yesterday.  Looking for SNF bed for her.  Objective: Vital signs in last 24 hours: Temp:  [98 F (36.7 C)-98.3 F (36.8 C)] 98.2 F (36.8 C) (09/01 0559) Pulse Rate:  [65-74] 66 (09/01 0559) Resp:  [20-22] 20 (09/01 0559) BP: (145-154)/(64-70) 145/67 (09/01 0559) SpO2:  [98 %-100 %] 98 % (09/01 0559) Last BM Date: 07/01/16 800 PO 1275 urine BM 1 Afebrile vital signs are stable No labs today Intake/Output from previous day: 08/31 0701 - 09/01 0700 In: 800 [P.O.:800] Out: 1275 [Urine:1275] Intake/Output this shift: Total I/O In: -  Out: 690 [Urine:690]  General appearance: alert, cooperative and no distress GI: soft, non-tender; bowel sounds normal; no masses,  no organomegaly and Dressing removed incision looks good wicks were removed.  Lab Results:   Recent Labs  07/02/16 0401  HGB 8.0*    BMET  Recent Labs  07/01/16 0419 07/02/16 0401  NA 137 133*  K 3.9 3.8  3.8  CL 113* 111  CO2 21* 20*  GLUCOSE 146* 64*  BUN 9 10  CREATININE 0.99 0.85  0.87  CALCIUM 8.7* 8.3*   PT/INR No results for input(s): LABPROT, INR in the last 72 hours.   Recent Labs Lab 06/27/16 2133 06/28/16 0416  AST 19 16  ALT 10* 11*  ALKPHOS 71 62  BILITOT 0.5 0.6  PROT 7.8 6.8  ALBUMIN 3.9 3.3*     Lipase     Component Value Date/Time   LIPASE 43 06/27/2016 2133     Studies/Results: No results found.  Medications: . acetaminophen  1,000 mg Oral TID  . aspirin EC  81 mg Oral Daily  . enoxaparin (LOVENOX) injection  40 mg Subcutaneous Q24H  . feeding supplement (ENSURE ENLIVE)  237 mL Oral BID BM  . lip balm  1 application Topical BID  . loratadine  10 mg Oral Daily  . multivitamins with iron  1 tablet Oral Daily  . pantoprazole  40 mg Oral Daily  . polyethylene glycol  17 g Oral Daily  . prednisoLONE acetate  2 drop Right Eye BID   And  . prednisoLONE acetate  1 drop Left Eye BID  .  saccharomyces boulardii  250 mg Oral BID  . sertraline  25 mg Oral Daily  . sodium chloride flush  3 mL Intravenous Q12H  . sodium chloride flush  3 mL Intravenous Q12H  . sodium chloride flush  3 mL Intravenous Q12H     Assessment/Plan Recurrent sigmoid volvulus S/p Laparoscopic sigmoid colectomy, rigid sigmoidoscopy primary repair of recurrent incisional hernia. 06/30/16 Dr. Michael Boston,  POD #3 FEN:  Soft diet ID: No antibiotics DVT: Lovenox   Plan: She can shower, keep it dry dressing over the site until is completely healed. I will put follow-up information in the AVS along with discharge instructions for her surgery. Follow-Up with Dr. Johney Maine in 2-3 Weeks.     LOS: 5 days    Erika Day 07/03/2016 [phone]

## 2016-07-03 NOTE — Progress Notes (Signed)
Posey Pronto, MD paged prior to patient discharge due to elevated blood pressure for inability to leave per PTAR protocol and [place]'s request. MD returned page and patient was given tramadol for pain and hydralazine 25mg  PO per MD request and approval. Patient was transported to [facility] after administration of medication.

## 2016-07-03 NOTE — Discharge Instructions (Signed)
SURGERY: POST OP INSTRUCTIONS °(Surgery for small bowel obstruction, colon resection, etc) ° ° °###################################################################### ° °EAT °Gradually transition to a high fiber diet with a fiber supplement over the next few days after discharge ° °WALK °Walk an hour a day.  Control your pain to do that.   ° °CONTROL PAIN °Control pain so that you can walk, sleep, tolerate sneezing/coughing, go up/down stairs. ° °HAVE A BOWEL MOVEMENT DAILY °Keep your bowels regular to avoid problems.  OK to try a laxative to override constipation.  OK to use an antidairrheal to slow down diarrhea.  Call if not better after 2 tries ° °CALL IF YOU HAVE PROBLEMS/CONCERNS °Call if you are still struggling despite following these instructions. °Call if you have concerns not answered by these instructions ° °###################################################################### ° ° °DIET °Follow a light diet the first few days at home.  Start with a bland diet such as soups, liquids, starchy foods, low fat foods, etc.  If you feel full, bloated, or constipated, stay on a ful liquid or pureed/blenderized diet for a few days until you feel better and no longer constipated. °Be sure to drink plenty of fluids every day to avoid getting dehydrated (feeling dizzy, not urinating, etc.). °Gradually add a fiber supplement to your diet over the next week.  Gradually get back to a regular solid diet.  Avoid fast food or heavy meals the first week as you are more likely to get nauseated. °It is expected for your digestive tract to need a few months to get back to normal.  It is common for your bowel movements and stools to be irregular.  You will have occasional bloating and cramping that should eventually fade away.  Until you are eating solid food normally, off all pain medications, and back to regular activities; your bowels will not be normal. °Focus on eating a low-fat, high fiber diet the rest of your life  (See Getting to Good Bowel Health, below). ° °CARE of your INCISION or WOUND °It is good for closed incision and even open wounds to be washed every day.  Shower every day.  Short baths are fine.  Wash the incisions and wounds clean with soap & water.    °If you have a closed incision(s), wash the incision with soap & water every day.  You may leave closed incisions open to air if it is dry.   You may cover the incision with clean gauze & replace it after your daily shower for comfort. °If you have skin tapes (Steristrips) or skin glue (Dermabond) on your incision, leave them in place.  They will fall off on their own like a scab.  You may trim any edges that curl up with clean scissors.  If you have staples, set up an appointment for them to be removed in the office in 10 days after surgery.  °If you have a drain, wash around the skin exit site with soap & water and place a new dressing of gauze or band aid around the skin every day.  Keep the drain site clean & dry.    °If you have an open wound with packing, see wound care instructions.  In general, it is encouraged that you remove your dressing and packing, shower with soap & water, and replace your dressing once a day.  Pack the wound with clean gauze moistened with normal (0.9%) saline to keep the wound moist & uninfected.  Pressure on the dressing for 30 minutes will stop most wound   bleeding.  Eventually your body will heal & pull the open wound closed over the next few months.  °Raw open wounds will occasionally bleed or secrete yellow drainage until it heals closed.  Drain sites will drain a little until the drain is removed.  Even closed incisions can have mild bleeding or drainage the first few days until the skin edges scab over & seal.   °If you have an open wound with a wound vac, see wound vac care instructions. ° ° ° ° °ACTIVITIES as tolerated °Start light daily activities --- self-care, walking, climbing stairs-- beginning the day after surgery.   Gradually increase activities as tolerated.  Control your pain to be active.  Stop when you are tired.  Ideally, walk several times a day, eventually an hour a day.   °Most people are back to most day-to-day activities in a few weeks.  It takes 4-8 weeks to get back to unrestricted, intense activity. °If you can walk 30 minutes without difficulty, it is safe to try more intense activity such as jogging, treadmill, bicycling, low-impact aerobics, swimming, etc. °Save the most intensive and strenuous activity for last (Usually 4-8 weeks after surgery) such as sit-ups, heavy lifting, contact sports, etc.  Refrain from any intense heavy lifting or straining until you are off narcotics for pain control.  You will have off days, but things should improve week-by-week. °DO NOT PUSH THROUGH PAIN.  Let pain be your guide: If it hurts to do something, don't do it.  Pain is your body warning you to avoid that activity for another week until the pain goes down. °You may drive when you are no longer taking narcotic prescription pain medication, you can comfortably wear a seatbelt, and you can safely make sudden turns/stops to protect yourself without hesitating due to pain. °You may have sexual intercourse when it is comfortable. If it hurts to do something, stop. ° °MEDICATIONS °Take your usually prescribed home medications unless otherwise directed.   °Blood thinners:  °Usually you can restart any strong blood thinners after the second postoperative day.  It is OK to take aspirin right away.    ° If you are on strong blood thinners (warfarin/Coumadin, Plavix, Xerelto, Eliquis, Pradaxa, etc), discuss with your surgeon, medicine PCP, and/or cardiologist for instructions on when to restart the blood thinner & if blood monitoring is needed (PT/INR blood check, etc).   ° ° °PAIN CONTROL °Pain after surgery or related to activity is often due to strain/injury to muscle, tendon, nerves and/or incisions.  This pain is usually  short-term and will improve in a few months.  °To help speed the process of healing and to get back to regular activity more quickly, DO THE FOLLOWING THINGS TOGETHER: °1. Increase activity gradually.  DO NOT PUSH THROUGH PAIN °2. Use Ice and/or Heat °3. Try Gentle Massage and/or Stretching °4. Take over the counter pain medication °5. Take Narcotic prescription pain medication for more severe pain ° °Good pain control = faster recovery.  It is better to take more medicine to be more active than to stay in bed all day to avoid medications. °1.  Increase activity gradually °Avoid heavy lifting at first, then increase to lifting as tolerated over the next 6 weeks. °Do not “push through” the pain.  Listen to your body and avoid positions and maneuvers than reproduce the pain.  Wait a few days before trying something more intense °Walking an hour a day is encouraged to help your body recover faster   and more safely.  Start slowly and stop when getting sore.  If you can walk 30 minutes without stopping or pain, you can try more intense activity (running, jogging, aerobics, cycling, swimming, treadmill, sex, sports, weightlifting, etc.) °Remember: If it hurts to do it, then don’t do it! °2. Use Ice and/or Heat °You will have swelling and bruising around the incisions.  This will take several weeks to resolve. °Ice packs or heating pads (6-8 times a day, 30-60 minutes at a time) will help sooth soreness & bruising. °Some people prefer to use ice alone, heat alone, or alternate between ice & heat.  Experiment and see what works best for you.  Consider trying ice for the first few days to help decrease swelling and bruising; then, switch to heat to help relax sore spots and speed recovery. °Shower every day.  Short baths are fine.  It feels good!  Keep the incisions and wounds clean with soap & water.   °3. Try Gentle Massage and/or Stretching °Massage at the area of pain many times a day °Stop if you feel pain - do not  overdo it °4. Take over the counter pain medication °This helps the muscle and nerve tissues become less irritable and calm down faster °Choose ONE of the following over-the-counter anti-inflammatory medications: °Acetaminophen 500mg tabs (Tylenol) 1-2 pills with every meal and just before bedtime (avoid if you have liver problems or if you have acetaminophen in you narcotic prescription) °Naproxen 220mg tabs (ex. Aleve, Naprosyn) 1-2 pills twice a day (avoid if you have kidney, stomach, IBD, or bleeding problems) °Ibuprofen 200mg tabs (ex. Advil, Motrin) 3-4 pills with every meal and just before bedtime (avoid if you have kidney, stomach, IBD, or bleeding problems) °Take with food/snack several times a day as directed for at least 2 weeks to help keep pain / soreness down & more manageable. °5. Take Narcotic prescription pain medication for more severe pain °A prescription for strong pain control is often given to you upon discharge (for example: oxycodone/Percocet, hydrocodone/Norco/Vicodin, or tramadol/Ultram) °Take your pain medication as prescribed. °Be mindful that most narcotic prescriptions contain Tylenol (acetaminophen) as well - avoid taking too much Tylenol. °If you are having problems/concerns with the prescription medicine (does not control pain, nausea, vomiting, rash, itching, etc.), please call us [phone] to see if we need to switch you to a different pain medicine that will work better for you and/or control your side effects better. °If you need a refill on your pain medication, you must call the office before 4 pm and on weekdays only.  By federal law, prescriptions for narcotics cannot be called into a pharmacy.  They must be filled out on paper & picked up from our office by the patient or authorized caretaker.  Prescriptions cannot be filled after 4 pm nor on weekends.   ° °WHEN TO CALL US [phone] °Severe uncontrolled or worsening pain  °Fever over 101 F (38.5 C) °Concerns with  the incision: Worsening pain, redness, rash/hives, swelling, bleeding, or drainage °Reactions / problems with new medications (itching, rash, hives, nausea, etc.) °Nausea and/or vomiting °Difficulty urinating °Difficulty breathing °Worsening fatigue, dizziness, lightheadedness, blurred vision °Other concerns °If you are not getting better after two weeks or are noticing you are getting worse, contact our office [phone] for further advice.  We may need to adjust your medications, re-evaluate you in the office, send you to the emergency room, or see what other things we can do to help. °The   clinic staff is available to answer your questions during regular business hours (8:30am-5pm).  Please don’t hesitate to call and ask to speak to one of our nurses for clinical concerns.    °A surgeon from [facility] is always on call at the hospitals 24 hours/day °If you have a medical emergency, go to the nearest emergency room or call 911. ° °FOLLOW UP in our office °One the day of your discharge from the hospital (or the next business weekday), please call [facility] to set up or confirm an appointment to see your surgeon in the office for a follow-up appointment.  Usually it is 2-3 weeks after your surgery.   °If you have skin staples at your incision(s), let the office know so we can set up a time in the office for the nurse to remove them (usually around 10 days after surgery). °Make sure that you call for appointments the day of discharge (or the next business weekday) from the hospital to ensure a convenient appointment time. °IF YOU HAVE DISABILITY OR FAMILY LEAVE FORMS, BRING THEM TO THE OFFICE FOR PROCESSING.  DO NOT GIVE THEM TO YOUR DOCTOR. ° °[facility] °[address], [address], [place],   [address] ? °[phone] - Main °[phone] - Toll Free,  [phone] - Fax °[email or web address] ° °GETTING TO GOOD BOWEL HEALTH. °It is  expected for your digestive tract to need a few months to get back to normal.  It is common for your bowel movements and stools to be irregular.  You will have occasional bloating and cramping that should eventually fade away.  Until you are eating solid food normally, off all pain medications, and back to regular activities; your bowels will not be normal.   °Avoiding constipation °The goal: ONE SOFT BOWEL MOVEMENT A DAY!    °Drink plenty of fluids.  Choose water first. °TAKE A FIBER SUPPLEMENT EVERY DAY THE REST OF YOUR LIFE °During your first week back home, gradually add back a fiber supplement every day °Experiment which form you can tolerate.   There are many forms such as powders, tablets, wafers, gummies, etc °Psyllium bran (Metamucil), methylcellulose (Citrucel), Miralax or Glycolax, Benefiber, Flax Seed.  °Adjust the dose week-by-week (1/2 dose/day to 6 doses a day) until you are moving your bowels 1-2 times a day.  Cut back the dose or try a different fiber product if it is giving you problems such as diarrhea or bloating. °Sometimes a laxative is needed to help jump-start bowels if constipated until the fiber supplement can help regulate your bowels.  If you are tolerating eating & you are farting, it is okay to try a gentle laxative such as double dose MiraLax, prune juice, or Milk of Magnesia.  Avoid using laxatives too often. °Stool softeners can sometimes help counteract the constipating effects of narcotic pain medicines.  It can also cause diarrhea, so avoid using for too long. °If you are still constipated despite taking fiber daily, eating solids, and a few doses of laxatives, call our office. °Controlling diarrhea °Try drinking liquids and eating bland foods for a few days to avoid stressing your intestines further. °Avoid dairy products (especially milk & ice cream) for a short time.  The intestines often can lose the ability to digest lactose when stressed. °Avoid foods that cause gassiness or  bloating.  Typical foods include beans and other legumes, cabbage, broccoli, and dairy foods.  Avoid greasy, spicy, fast foods.  Every person has   some sensitivity to other foods, so listen to your body and avoid those foods that trigger problems for you. Probiotics (such as active yogurt, Align, etc) may help repopulate the intestines and colon with normal bacteria and calm down a sensitive digestive tract Adding a fiber supplement gradually can help thicken stools by absorbing excess fluid and retrain the intestines to act more normally.  Slowly increase the dose over a few weeks.  Too much fiber too soon can backfire and cause cramping & bloating. It is okay to try and slow down diarrhea with a few doses of antidiarrheal medicines.   Bismuth subsalicylate (ex. Kayopectate, Pepto Bismol) for a few doses can help control diarrhea.  Avoid if pregnant.   Loperamide (Imodium) can slow down diarrhea.  Start with one tablet (2mg ) first.  Avoid if you are having fevers or severe pain.  ILEOSTOMY PATIENTS WILL HAVE CHRONIC DIARRHEA since their colon is not in use.    Drink plenty of liquids.  You will need to drink even more glasses of water/liquid a day to avoid getting dehydrated. Record output from your ileostomy.  Expect to empty the bag every 3-4 hours at first.  Most people with a permanent ileostomy empty their bag 4-6 times at the least.   Use antidiarrheal medicine (especially Imodium) several times a day to avoid getting dehydrated.  Start with a dose at bedtime & breakfast.  Adjust up or down as needed.  Increase antidiarrheal medications as directed to avoid emptying the bag more than 8 times a day (every 3 hours). Work with your wound ostomy nurse to learn care for your ostomy.  See ostomy care instructions. TROUBLESHOOTING IRREGULAR BOWELS 1) Start with a soft & bland diet. No spicy, greasy, or fried foods.  2) Avoid gluten/wheat or dairy products from diet to see if symptoms improve. 3) Miralax  17gm or flax seed mixed in [place]. water or juice-daily. May use 2-4 times a day as needed. 4) Gas-X, Phazyme, etc. as needed for gas & bloating.  5) Prilosec (omeprazole) over-the-counter as needed 6)  Consider probiotics (Align, Activa, etc) to help calm the bowels down  Call your doctor if you are getting worse or not getting better.  Sometimes further testing (cultures, endoscopy, X-ray studies, CT scans, bloodwork, etc.) may be needed to help diagnose and treat the cause of the diarrhea. [facility], [place], [place], [place],   [address] [phone] - Main.    [phone]  - Toll Free.   [phone] - Fax [email or web address]   Volvulus Volvulus is an abnormal twisting of a portion of your digestive tract. Your digestive tract consists of your swallowing tube (esophagus), followed by your stomach, small intestine, and large intestine. This twisting can block the flow of digestion (intestinal obstruction). It can also block the blood flow to the part of the digestive tract that is twisted. Lack of blood flow can cause the twisted part of the digestive tract to die. Volvulus is a medical emergency. There are various types of volvulus:  Sigmoid volvulus is a twisting of the last part of your large intestine. This is the most common type.  Midgut volvulus usually occurs in children who are born with an abnormally positioned small intestine (malrotation).  Cecal volvulus may be caused by scar tissue from previous abdominal surgery.  Gastric volvulus is a rare type of volvulus that occurs when the stomach twists around itself. CAUSES  Volvulus may be caused by many different things.  It can be something you are born with (congenital deformity), or it may be a problem that develops from another condition. RISK FACTORS You may have a greater risk of volvulus if you:  Are 24 years of age or older.  Have long-standing (chronic)  constipation.  Have part of your stomach located above the area where the stomach and esophagus meet (paraesophageal hernia).  Are bedridden.  Have had previous abdominal surgery.  Live in a long-term care facility. SIGNS AND SYMPTOMS  Signs and symptoms of most types of volvulus include:  Abdominal pain.  Sigmoid volvulus may cause pain in the lower left part of the abdomen.  Cecal volvulus may cause pain in the lower right part of the abdomen.  Gastric and midgut volvulus may cause pain in the upper abdomen.  Bloating and swelling of the abdomen.  Decreased passing of gas or inability to pass gas.  Nausea.  Vomiting.  Constipation.  Tenderness when pressing on the abdomen. As the condition gets worse, the volvulus can develop a hole (perforation) and leak digestive contents into the abdomen. This can cause late symptoms of volvulus, including:  Severe infection (sepsis).  Bleeding into the abdomen.  Very low blood pressure (shock). DIAGNOSIS  Your health care provider may suspect volvulus if you have sudden signs and symptoms of intestinal obstruction. A physical exam will be done. The health care provider will listen to your abdomen for the sounds of digestion and will feel your abdomen for tenderness. Imaging studies of your abdomen may also be done. These may include:  CT scan. This is the best imaging study for diagnosing volvulus.  Plain X-rays. These may show air and fluid levels and widening above the obstruction.  Ultrasound. TREATMENT  Volvulus is almost always a medical emergency requiring immediate surgery. A surgeon may attempt to do a procedure to untwist the volvulus if possible. If the volvulus cannot be untwisted, the part of the digestive tract involved may need to be removed.   This information is not intended to replace advice given to you by your health care provider. Make sure you discuss any questions you have with your health care provider.     Document Released: 07/14/2001 Document Revised: 11/09/2014 Document Reviewed: 07/04/2014 Elsevier Interactive Patient Education [employer].

## 2016-07-03 NOTE — Progress Notes (Signed)
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 Needs reinforcement for safety  Follow Up Recommendations  SNF    Equipment Recommendations  None recommended by OT    Recommendations for Other Services      Precautions / Restrictions Precautions Precautions: Fall Restrictions Weight Bearing Restrictions: No       Mobility Bed Mobility Overal bed mobility: Needs Assistance Bed Mobility: Supine to Sit     Supine to sit: Min guard;HOB elevated     General bed mobility comments: extra time  Transfers Overall transfer level: Needs assistance Equipment used: Rolling walker (2 wheeled) Transfers: Sit to/from [facility] Sit to Stand: Min assist Stand pivot transfers: Min guard       General transfer comment: vcs for safety, UE placement    Balance                                   ADL                           Toilet Transfer: Minimal assistance;Ambulation;BSC;RW   Toileting- Clothing Manipulation and Hygiene: Minimal assistance;Moderate assistance;Sit to/from stand         General ADL Comments: ambulated to bathroom. Cues for safety:  bringing walker with her completely before turning to 3:1, and keeping bil UEs on walker until she reaches sink.  Min A for hygiene and mod A for clothing management      Vision                     Perception     Praxis      Cognition   Behavior During Therapy: WFL for tasks assessed/performed Overall Cognitive Status: Within Functional  Limits for tasks assessed                       Extremity/Trunk Assessment               Exercises     Shoulder Instructions       General Comments      Pertinent Vitals/ Pain       Pain Assessment: Faces Faces Pain Scale: Hurts little more Pain Location: abdomen Pain Descriptors / Indicators: Sore Pain Intervention(s): Limited activity within patient's tolerance;Monitored during session;Repositioned  Home Living                                          Prior Functioning/Environment              Frequency       Progress Toward Goals  OT Goals(current goals can now be found in the care plan section)  Progress towards OT goals: Progressing toward goals     Plan      Co-evaluation                 End of Session  Activity Tolerance Patient tolerated treatment well   Patient Left  (transferred to stretcher for transport)   Nurse Communication          Time: [id number] OT Time Calculation (min): 18 min  Charges: OT General Charges $OT Visit: 1 Procedure OT Treatments $Self Care/Home Management : 8-22 mins  Taggart Prasad 07/03/2016, 2:25 PM  Lesle Chris, OTR/L [phone] 07/03/2016

## 2016-07-03 NOTE — Progress Notes (Signed)
Physical Therapy Treatment Patient Details Name: Erika Day MRN: [id number] DOB: August 24, 1930 Today's Date: 07/10/2016    History of Present Illness 82 year old with past mental history of uterine tumor status post resection and multiple other abdominal surgeries with chronic constipation, HTN, lumbar DDD, DM, CVA, CHF, recently diagnosed with non-Hodgkin's lymphoma admitted with Sigmoid volvulus s/p lap sigmoid colectomy 06/30/2016    PT Comments    Pt assisted to [facility] due to urgency and then ambulated to tolerance.    Follow Up Recommendations  Supervision/Assistance - 24 hour;SNF     Equipment Recommendations  None recommended by PT    Recommendations for Other Services       Precautions / Restrictions Precautions Precautions: Fall    Mobility  Bed Mobility Overal bed mobility: Needs Assistance Bed Mobility: Supine to Sit     Supine to sit: Min assist;HOB elevated        Transfers Overall transfer level: Needs assistance Equipment used: Rolling walker (2 wheeled) Transfers: Sit to/from [facility] Sit to Stand: Min assist Stand pivot transfers: Min guard       General transfer comment: verbal cues for safe technique  Ambulation/Gait Ambulation/Gait assistance: Min guard Ambulation Distance (Feet): 20 Feet Assistive device: Rolling walker (2 wheeled) Gait Pattern/deviations: Step-through pattern;Trunk flexed;Decreased stride length     General Gait Details: verbal cues for posture and RW positioning, encouraged increased distance however pt fatigues quickly   Stairs            Wheelchair Mobility    Modified Rankin (Stroke Patients Only)       Balance                                    Cognition Arousal/Alertness: Awake/alert Behavior During Therapy: WFL for tasks assessed/performed Overall Cognitive Status: Within Functional Limits for tasks assessed                      Exercises      General  Comments        Pertinent Vitals/Pain Pain Assessment: Faces Faces Pain Scale: Hurts little more Pain Location: abdomen Pain Descriptors / Indicators: Sore Pain Intervention(s): Limited activity within patient's tolerance;Repositioned;Monitored during session    Home Living                      Prior Function            PT Goals (current goals can now be found in the care plan section) Progress towards PT goals: Progressing toward goals    Frequency  Min 3X/week    PT Plan Current plan remains appropriate    Co-evaluation             End of Session Equipment Utilized During Treatment: Gait belt Activity Tolerance: Patient limited by fatigue Patient left: with call bell/phone within reach;in chair;with chair alarm set     Time: [id number] PT Time Calculation (min) (ACUTE ONLY): 17 min  Charges:  $Gait Training: 8-22 mins                    G Codes:      Keyshla Tunison,KATHrine E 10-Jul-2016, 1:27 PM Carmelia Bake, PT, DPT July 10, 2016 Pager: [phone]

## 2016-07-03 NOTE — Clinical Social Work Placement (Signed)
   CLINICAL SOCIAL WORK PLACEMENT  NOTE  Date:  07/03/2016  Patient Details  Name: Erika Day MRN: [id number] Date of Birth: [birth year]  Clinical Social Work is seeking post-discharge placement for this patient at the [place] level of care (*CSW will initial, date and re-position this form in  chart as items are completed):  Yes   Patient/family provided with [place] Work Department's list of facilities offering this level of care within the geographic area requested by the patient (or if unable, by the patient's family).  Yes   Patient/family informed of their freedom to choose among providers that offer the needed level of care, that participate in Medicare, Medicaid or managed care program needed by the patient, have an available bed and are willing to accept the patient.  Yes   Patient/family informed of [place]'s ownership interest in [facility] and [facility], as well as of the fact that they are under no obligation to receive care at these facilities.  PASRR submitted to EDS on 07/02/16     PASRR number received on 07/02/16     Existing PASRR number confirmed on       FL2 transmitted to all facilities in geographic area requested by pt/family on 07/02/16     FL2 transmitted to all facilities within larger geographic area on       Patient informed that his/her managed care company has contracts with or will negotiate with certain facilities, including the following:        Yes   Patient/family informed of bed offers received.  Patient chooses bed at  [facility] and [facility] )     Physician recommends and patient chooses bed at      Patient to be transferred to  ([facility]) on 07/03/16.  Patient to be transferred to facility by EMS     Patient family notified on 07/03/16 of transfer.  Name of family member notified:   (Pt's dtr, [name] )     PHYSICIAN       Additional  Comment:    _______________________________________________ Naida Sleight, LCSW 07/03/2016, 1:49 PM

## 2016-07-03 NOTE — Discharge Summary (Addendum)
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 Please follow-up with PCP in one week. 2. Please follow-up with general surgery as recommended. 3.  Please follow-up wound care as recommended and instruction.  Follow-up Information    [name]., MD .   Specialty:  General Surgery Why:  Call and make an appointment in 2-3 weeks. Contact information: [address] [place] [address] [phone]        Philis Fendt, MD. Schedule an appointment as soon as possible for a visit in 1 week(s).   Specialty:  Internal Medicine Contact information: [place] [phone]          Diet recommendation: Cardiac diet, initially soft and later on he can increase consistency.  Activity: The patient is advised to gradually reintroduce usual activities.  Discharge Condition: good  Code Status: Full code  History of present illness: As per the H and P dictated on admission, "Erika Day is a 82 y.o. female with medical history significant of recurrent sigmoid volvulus, DM, HTN, CLL, non-Hodgkin B cell lymphoma of axilla, CVA at age of 49. Presented with lower abdominal pain and diarrhea for the past 2 days worsened by eating and drinking. Patient has history of recurrent volvulus and his symptoms are similar to prior. Daughter try to give some ginger ale but  it did not seem to help no fevers or chills no chest pain shortness of breath she hasn't been having bowel movements his symptoms have been getting progressively worse and she presented to emergency department"  Hospital Course:  Summary of her active problems in the hospital is as following. 1. Sigmoid volvulus ([place]) S/p flexible sigmoidoscopy by gastroenterology 06/28/2016. S/P laparoscopic sigmoid colectomy and primary repair of recurrent incisional hernia 06/30/2016 by general surgery. GI currently signed off. Patient tolerating soft diet very well at present. Surgery feels comfortable with the patient can be safely discharged to SNF.  2. Hypomagnesemia. Stable  3. Hypokalemia. Resolved  4. Non-Hodgkin's B-cell lymphoma. Currently not on any treatment. Surgical note Mentions no metastasis.  5. Protein calorie malnutrition. Severe. Initiate supplementation  6. Bicytopenia. Stable, will need outpatient monitoring.  7. Hypertension added hydralazine.   All other chronic medical condition were stable during the hospitalization.  Patient was seen by physical therapy, who recommended SNF, which was arranged by Education officer, museum and case Freight forwarder. On the day of the discharge the patient's vitals were stable, and no other acute medical condition were reported by patient. the patient was felt safe to be discharge at SNF with therapy.  Procedures and Results:  Flexible sigmoidoscopy.  Sigmoid colectomy laparoscopic.   Consultations:  Gastroenterology  General surgery  DISCHARGE MEDICATION: Current Discharge Medication List    START taking these medications   Details  feeding supplement, ENSURE ENLIVE, (ENSURE ENLIVE) LIQD Take 237 mLs by mouth 2 (two) times daily between meals. Qty: 237 mL, Refills: 12  hydrALAZINE (APRESOLINE) 25 MG tablet Take 1 tablet (25 mg total) by mouth every 8 (eight) hours. Qty: 90 tablet, Refills: 0    methocarbamol (ROBAXIN) 500 MG  tablet Take 1 tablet (500 mg total) by mouth every 6 (six) hours as needed for muscle spasms. Qty: 30 tablet, Refills: 0    traMADol (ULTRAM) 50 MG tablet Take 1 tablet (50 mg total) by mouth every 6 (six) hours as needed for moderate pain or severe pain. Qty: 20 tablet, Refills: 0      CONTINUE these medications which have NOT CHANGED   Details  aspirin EC 81 MG tablet Take 1 tablet (81 mg total) by mouth daily. Qty: 30 tablet, Refills: 0    furosemide (LASIX) 20 MG tablet Take 20 mg by mouth daily as needed for fluid.    Loratadine (CLARITIN PO) Take 10 mg by mouth.    pantoprazole (PROTONIX) 40 MG tablet Take 1 tablet (40 mg total) by mouth daily. Qty: 30 tablet, Refills: 0    prednisoLONE acetate (PRED FORTE) 1 % ophthalmic suspension Place 1-2 drops into both eyes See admin instructions. 2 drops in right eye and 1 drop in the left eye twice daily    sertraline (ZOLOFT) 25 MG tablet Take 1 tablet (25 mg total) by mouth daily. Reported on 05/19/2016 Qty: 30 tablet, Refills: 0    polyethylene glycol (MIRALAX / GLYCOLAX) packet Take 17 g by mouth 2 (two) times daily. Qty: 14 each, Refills: 0      STOP taking these medications     lisinopril (PRINIVIL,ZESTRIL) 10 MG tablet      polyethylene glycol powder (GLYCOLAX/MIRALAX) powder        Allergies  Allergen Reactions  . Morphine And Related Other (See Comments)    Blood sugar dropped one-time    Discharge Exam: Filed Weights   06/27/16 2029  Weight: 59 kg (130 lb)   Vitals:   07/02/16 2230 07/03/16 0559  BP: (!) 149/70 (!) 145/67  Pulse: 74 66  Resp: 20 20  Temp: 98 F (36.7 C) 98.2 F (36.8 C)   General: Appear in no distress, no Rash; Oral Mucosa moist. Cardiovascular: S1 and S2 Present, aortic systolic Murmur, no JVD Respiratory: Bilateral Air entry present and Clear to Auscultation, no Crackles, no wheezes Abdomen: Bowel Sound present, Soft and no tenderness Extremities: no Pedal edema, no calf  tenderness Neurology: Grossly no focal neuro deficit.  The results of significant diagnostics from this hospitalization (including imaging, microbiology, ancillary and laboratory) are listed below for reference.    Significant Diagnostic Studies: Ct Abdomen Pelvis W Contrast  Result Date: 06/27/2016 CLINICAL DATA:  Abdominal pain and diarrhea for 2 days. History of bowel volvulus, small bowel obstruction, ventral hernia repair and lymphoma. EXAM: CT ABDOMEN AND PELVIS WITH CONTRAST TECHNIQUE: Multidetector CT imaging of the abdomen and pelvis was performed using the standard protocol following bolus administration of intravenous contrast. CONTRAST:  1108mL ISOVUE-300 IOPAMIDOL (ISOVUE-300) INJECTION 61% COMPARISON:  PET- CT May 22, 2016 and CT abdomen and pelvis May 18, 2015 FINDINGS: LUNG BASES: Elevated RIGHT hemidiaphragm mildly displacing the heart to the LEFT. Heart size is normal. Severe coronary artery calcifications. No pericardial effusion. Lung bases are clear. SOLID ORGANS: The liver demonstrates mild biliary dilatation, status post cholecystectomy. Mild splenomegaly. Pancreas and adrenal glands are unremarkable. GASTROINTESTINAL TRACT: Sigmoid volvulus, with bird's beak appearance and associated swirled mesentery. Proximal colon is distended to 8.2 cm with large amount of stool and stool air-fluid levels. Distended rectum  with air-fluid level. Enteric contrast in the small bowel KIDNEYS/ URINARY TRACT: Kidneys are orthotopic, demonstrating symmetric enhancement. Punctate LEFT upper pole nephrolithiasis. No hydronephrosis or solid renal masses. The unopacified ureters are normal in course and caliber. Delayed imaging through the kidneys demonstrates symmetric prompt contrast excretion within the proximal urinary collecting system. Urinary bladder is partially distended and unremarkable. PERITONEUM/RETROPERITONEUM: Tortuous abdominal aorta, normal in caliber with severe calcific atherosclerosis.  No lymphadenopathy by CT size criteria. Internal reproductive organs are unremarkable. Small amount of ascites is likely reactive. No pneumoperitoneum. SOFT TISSUE/OSSEOUS STRUCTURES: Non-suspicious. Sub cm inguinal lymph nodes decreased in size from prior CT. Moderate sacroiliac osteoarthrosis. Severe degenerative change of the lumbar spine. Anterior abdominal wall scarring. IMPRESSION: Sigmoid volvulus resulting in large bowel obstruction. Splenomegaly, in keeping with patient's history of lymphoma. Severe atherosclerosis. Acute findings discussed with and reconfirmed by Dr.ANTHONY ALLEN on 06/27/2016 at 11:35 pm. Electronically Signed   By: Elon Alas M.D.   On: 06/27/2016 23:38    Microbiology: Recent Results (from the past 240 hour(s))  Surgical PCR screen     Status: None   Collection Time: 06/30/16 12:15 AM  Result Value Ref Range Status   MRSA, PCR NEGATIVE NEGATIVE Final   Staphylococcus aureus NEGATIVE NEGATIVE Final    Comment:        The Xpert SA Assay (FDA approved for NASAL specimens in patients over 28 years of age), is one component of a comprehensive surveillance program.  Test performance has been validated by [facility] for patients greater than or equal to 49 year old. It is not intended to diagnose infection nor to guide or monitor treatment.      Labs: CBC:  Recent Labs Lab 06/27/16 2133 06/28/16 0416 06/29/16 0345 06/30/16 0403 07/02/16 0401  WBC 11.7* 9.4 9.2 9.6  --   HGB 10.5* 9.6* 9.3* 8.7* 8.0*  HCT 32.2* 29.4* 27.9* 26.4*  --   MCV 86.8 85.0 85.8 84.9  --   PLT 131* 108* 104* 100*  --    Basic Metabolic Panel:  Recent Labs Lab 06/28/16 0416 06/29/16 0345 06/30/16 0403 07/01/16 0419 07/02/16 0401  NA 138 137 138 137 133*  K 3.3* 3.2* 3.2* 3.9 3.8  3.8  CL 110 112* 116* 113* 111  CO2 24 21* 19* 21* 20*  GLUCOSE 96 74 101* 146* 64*  BUN 18 14 11 9 10   CREATININE 0.87 0.92 0.72 0.99 0.85  0.87  CALCIUM 9.2 8.8* 8.6* 8.7* 8.3*   MG 1.8  --  1.5* 1.3* 1.6*  1.7  PHOS 3.0  --   --   --   --    Liver Function Tests:  Recent Labs Lab 06/27/16 2133 06/28/16 0416  AST 19 16  ALT 10* 11*  ALKPHOS 71 62  BILITOT 0.5 0.6  PROT 7.8 6.8  ALBUMIN 3.9 3.3*    Recent Labs Lab 06/27/16 2133  LIPASE 43   No results for input(s): AMMONIA in the last 168 hours. Cardiac Enzymes: No results for input(s): CKTOTAL, CKMB, CKMBINDEX, TROPONINI in the last 168 hours. BNP (last 3 results)  Recent Labs  02/13/16 0350  BNP 443.6*   CBG:  Recent Labs Lab 06/28/16 0829 06/28/16 1151 06/30/16 1340 07/01/16 0757  GLUCAP 94 79 171* 65   Time spent: 30 minutes  Signed:  Peter Daquila  [facility]  07/03/2016  , 12:26 PM

## 2016-07-03 NOTE — Clinical Social Work Note (Signed)
CSW submitted d/c summary to facility.  CSW called daughter, left a message, per her request, and provided information about discharge/transport.  CSW called [facility] and arranged transport.  CSW signing off as there are no further needs at this time.  [place], [place]

## 2016-07-03 NOTE — Progress Notes (Signed)
Called [facility] and gave report to [place] all questions and concerns. Transported by [facility] with prescriptions.

## 2016-07-16 ENCOUNTER — Telehealth: Payer: Self-pay | Admitting: *Deleted

## 2016-07-16 NOTE — Telephone Encounter (Signed)
Called pt's home number and someone else answered said it was the wrong number.  Called the cell phone number and a man answered said she is living at a Nursing home called [facility].  I could not find this nursing home online.   I called pt's daughter's phone and left VM about appt on 9/28.  Asked her to please call nurse back to confirm if pt can make it to this appt.Marland Kitchen

## 2016-07-16 NOTE — Telephone Encounter (Signed)
-----   Message from Heath Lark, MD sent at 07/15/2016  3:47 PM EDT ----- Regarding: FW: Tumor Board Discussion 07/15/16 Please call her I will see her back sooner on 9/28, no labs at 130 pm, check in 1 pm. She is on the schedule Let me know if she cannot make it ----- Message ----- From: Tania Ade, RN Sent: 07/15/2016   1:10 PM To: Heath Lark, MD Subject: Tumor Board Discussion 07/15/16                 Dr. Burr Medico,  Had sigmoid colectomy 06/30/16 per Dr. Johney Maine w/diagnosis of Low grade NHL-B-cell Was suggested to discuss potential adjuvant treatment with oncology. You are scheduled to see her 08/27/16-do you want to see her any sooner? Also, does she need rad onc to see also? I saw an internal note from surgery to refer to med onc/rad onc, but don't see that they did this yet.  Thanks, Manuela Schwartz

## 2016-07-16 NOTE — Telephone Encounter (Signed)
Pt's daughter returned call regarding appt on 9/28.  She says pt in Rehab now but will be d/c'd home on 9/21.   She asks if there is any way appt on 9/28 can be earlier in the morning?  She wants to bring her mother to appt but her schedule will only allow her to come in the morning.

## 2016-07-17 NOTE — Telephone Encounter (Signed)
Informed daughter of appt at 8:30 am on 9/28.  Come 15 min early to check in.  She verbalized understanding,  Was appreciative.

## 2016-07-17 NOTE — Telephone Encounter (Signed)
I can see her 830 am, or other days in October

## 2016-07-17 NOTE — Telephone Encounter (Signed)
No need, I fixed it

## 2016-07-30 ENCOUNTER — Telehealth: Payer: Self-pay | Admitting: Hematology and Oncology

## 2016-07-30 ENCOUNTER — Ambulatory Visit: Payer: Medicare Other | Admitting: Hematology and Oncology

## 2016-07-30 ENCOUNTER — Encounter: Payer: Self-pay | Admitting: Nutrition

## 2016-07-30 ENCOUNTER — Telehealth: Payer: Self-pay | Admitting: *Deleted

## 2016-07-30 ENCOUNTER — Ambulatory Visit (HOSPITAL_BASED_OUTPATIENT_CLINIC_OR_DEPARTMENT_OTHER): Payer: Medicare Other | Admitting: Hematology and Oncology

## 2016-07-30 VITALS — BP 152/68 | HR 76 | Temp 98.6°F | Resp 18 | Ht 64.0 in | Wt 136.2 lb

## 2016-07-30 DIAGNOSIS — I10 Essential (primary) hypertension: Secondary | ICD-10-CM

## 2016-07-30 DIAGNOSIS — C8584 Other specified types of non-Hodgkin lymphoma, lymph nodes of axilla and upper limb: Secondary | ICD-10-CM | POA: Diagnosis not present

## 2016-07-30 NOTE — Progress Notes (Signed)
START ON PATHWAY REGIMEN - Lymphoma and CLL  LYOS201: Rituximab 375 mg/m2 Weekly x 4 Weeks   Administer weekly:     Rituximab (Rituxan(R)) 375 mg/m2 in _____ mL NS IV days 1, 8, 15, 22.  Initiate first dose at a rate of 50 mg/hr.  In the absence of infusion toxicity, increase infusion rate by 50 mg/hr increments every 30 minutes, to a maximum of 400 mg/hr.  For  follicular and DLBC lymphoma patients see "Rapid infusion protocol" link for more information about accelerating the infusion time of rituximab. Dose Mod: None Additional Orders: Hepatitis B&C testing recommended prior to rituximab use on all patients. Final concentration of rituximab must be between 1 and 4 mg/ml.  **Always confirm dose/schedule in your pharmacy ordering system**    Patient Characteristics: Marginal Zone, Systemic, First Line, Symptomatic Disease Type: Marginal Zone Disease Type: Not Applicable Localized or Systemic Disease? Systemic [place] Stage: IIIA Line of therapy: First Line Asymptomatic or Symptomatic? Symptomatic  Intent of Therapy: Curative Intent, Discussed with Patient

## 2016-07-30 NOTE — Telephone Encounter (Signed)
Message sent to chemo scheduler to add chemo. Avs report and appointment schedule given to patient, per 07/30/16 los. °

## 2016-07-30 NOTE — Progress Notes (Signed)
Received call from patients daughter requesting Ensure. Patient has received 3 cases of Ensure Plus previously.  I provided a variety of supplements for patient.

## 2016-07-30 NOTE — Telephone Encounter (Signed)
Per LOS I have scheduled appts and notified the scheduler 

## 2016-07-31 ENCOUNTER — Encounter: Payer: Self-pay | Admitting: Hematology and Oncology

## 2016-07-31 ENCOUNTER — Institutional Professional Consult (permissible substitution): Payer: Medicare Other | Admitting: Radiation Oncology

## 2016-07-31 NOTE — Assessment & Plan Note (Signed)
Her recent surgical specimen showed evidence of lymphoma involvement in the GI tract. We reviewed the current guidelines. I recommend we start her on immunotherapy. We discussed briefly the risk and benefit of starting her on weekly rituximab 4. I will like to give HER-2 more weeks of healing time with plan for chemotherapy consent and site of treatment within the next 2-3 weeks. She agreed with the plan of care

## 2016-07-31 NOTE — Progress Notes (Signed)
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[place])   05/18/2015 Imaging    Ct abdomen and pelvis showed reactive lymphadenopathy and stool filled colon      02/13/2016 Imaging    Ct chest showed pneumonia      05/08/2016 Imaging    screening mammogram revealed right axillary lymphadenopathy      05/12/2016 Procedure    She underwent core needle biopsy of axillary LN      05/12/2016 Pathology Results    [id number] Biopsy showed low grade non-Hodgkin B cell lymphoma, likely marginal zone lymphoma      05/15/2016 Pathology Results    Peripheral blood positive for deletion 13q      05/25/2016 PET scan    Mildly hypermetabolic mediastinal, left axillary and right inguinal lymph nodes. 2. Mild hypermetabolism in the left thyroid, nonspecific      06/27/2016 Imaging    Ct abdomen showed sigmoid volvulus resulting in large bowel obstruction. Splenomegaly, in keeping with patient's history of lymphoma.       06/30/2016 Pathology Results    Accession: [id number] pathology specimen showed low grade lymphoma      06/30/2016 Surgery    She had LAPAROSCOPIC SIGMOID COLECTOMY,  Rigid sigmoidoscopy, with Primary repair recurrent incisional hernia          INTERVAL HISTORY: Please see below for problem oriented charting. She returns today with her family members to review recent pathology report related to her surgery for volvulus She is recovering well and started to gain weight. She denies pain in her abdomen after surgery No changes in bowel habits  REVIEW OF SYSTEMS:   Constitutional: Denies fevers, chills or  abnormal weight loss Eyes: Denies blurriness of vision Ears, nose, mouth, throat, and face: Denies mucositis or sore throat Respiratory: Denies cough, dyspnea or wheezes Cardiovascular: Denies palpitation, chest discomfort or lower extremity swelling Gastrointestinal:  Denies nausea, heartburn or change in bowel habits Skin: Denies abnormal skin rashes Lymphatics: Denies new lymphadenopathy or easy bruising Neurological:Denies numbness, tingling or new weaknesses Behavioral/Psych: Mood is stable, no new changes  All other systems were reviewed with the patient and are negative.  I have reviewed the past medical history, past surgical history, social history and family history with the patient and they are unchanged from previous note.  ALLERGIES:  is allergic to morphine and related.  MEDICATIONS:  Current Outpatient Prescriptions  Medication Sig Dispense Refill  . aspirin EC 81 MG tablet Take 1 tablet (81 mg total) by mouth daily. 30 tablet 0  . feeding supplement, ENSURE ENLIVE, (ENSURE ENLIVE) LIQD Take 237 mLs by mouth 2 (two) times daily between meals. 237 mL 12  . furosemide (LASIX) 20 MG tablet Take 20 mg by mouth daily as needed for fluid.    . hydrALAZINE (APRESOLINE) 25 MG tablet Take 1 tablet (25 mg total) by mouth every 8 (eight) hours. 90 tablet 0  . Loratadine (CLARITIN PO) Take 10 mg by mouth.    . methocarbamol (ROBAXIN) 500 MG tablet Take 1 tablet (500 mg total) by mouth every 6 (six) hours as needed for muscle spasms. 30 tablet 0  . pantoprazole (PROTONIX) 40 MG tablet Take 1 tablet (  40 mg total) by mouth daily. 30 tablet 0  . polyethylene glycol (MIRALAX / GLYCOLAX) packet Take 17 g by mouth 2 (two) times daily. 14 each 0  . prednisoLONE acetate (PRED FORTE) 1 % ophthalmic suspension Place 1-2 drops into both eyes See admin instructions. 2 drops in right eye and 1 drop in the left eye twice daily    . sertraline (ZOLOFT) 25 MG tablet Take 1 tablet (25 mg total) by mouth  daily. Reported on 05/19/2016 30 tablet 0  . traMADol (ULTRAM) 50 MG tablet Take 1 tablet (50 mg total) by mouth every 6 (six) hours as needed for moderate pain or severe pain. 20 tablet 0   No current facility-administered medications for this visit.     PHYSICAL EXAMINATION: ECOG PERFORMANCE STATUS: 2 - Symptomatic, <50% confined to bed  Vitals:   07/30/16 0851  BP: (!) 152/68  Pulse: 76  Resp: 18  Temp: 98.6 F (37 C)   Filed Weights   07/30/16 0851  Weight: 136 lb 3.2 oz (61.8 kg)    GENERAL:alert, no distress and comfortable. She is thin and cachectic SKIN: skin color, texture, turgor are normal, no rashes or significant lesions EYES: normal, Conjunctiva are pink and non-injected, sclera clear OROPHARYNX:no exudate, no erythema and lips, buccal mucosa, and tongue normal  NECK: supple, thyroid normal size, non-tender, without nodularity LYMPH:  no palpable lymphadenopathy in the cervical, axillary or inguinal LUNGS: clear to auscultation and percussion with normal breathing effort HEART: regular rate & rhythm and no murmurs and no lower extremity edema ABDOMEN:abdomen soft, non-tender and normal bowel sounds. Well healed surgical scar Musculoskeletal:no cyanosis of digits and no clubbing  NEURO: alert & oriented x 3 with fluent speech, no focal motor/sensory deficits  LABORATORY DATA:  I have reviewed the data as listed    Component Value Date/Time   NA 133 (L) 07/02/2016 0401   NA 139 05/15/2016 1233   K 3.8 07/02/2016 0401   K 3.8 07/02/2016 0401   K 4.4 05/15/2016 1233   CL 111 07/02/2016 0401   CO2 20 (L) 07/02/2016 0401   CO2 24 05/15/2016 1233   GLUCOSE 64 (L) 07/02/2016 0401   GLUCOSE 127 05/15/2016 1233   BUN 10 07/02/2016 0401   BUN 11.6 05/15/2016 1233   CREATININE 0.87 07/02/2016 0401   CREATININE 0.85 07/02/2016 0401   CREATININE 1.0 05/15/2016 1233   CALCIUM 8.3 (L) 07/02/2016 0401   CALCIUM 9.5 05/15/2016 1233   PROT 6.8 06/28/2016 0416   PROT  7.6 05/15/2016 1233   ALBUMIN 3.3 (L) 06/28/2016 0416   ALBUMIN 3.4 (L) 05/15/2016 1233   AST 16 06/28/2016 0416   AST 13 05/15/2016 1233   ALT 11 (L) 06/28/2016 0416   ALT <9 05/15/2016 1233   ALKPHOS 62 06/28/2016 0416   ALKPHOS 83 05/15/2016 1233   BILITOT 0.6 06/28/2016 0416   BILITOT 0.30 05/15/2016 1233   GFRNONAA 59 (L) 07/02/2016 0401   GFRNONAA >60 07/02/2016 0401   GFRAA >60 07/02/2016 0401   GFRAA >60 07/02/2016 0401    No results found for: SPEP, UPEP  Lab Results  Component Value Date   WBC 9.6 06/30/2016   NEUTROABS 2.6 05/19/2016   HGB 8.0 (L) 07/02/2016   HCT 26.4 (L) 06/30/2016   MCV 84.9 06/30/2016   PLT 100 (L) 06/30/2016      Chemistry      Component Value Date/Time   NA 133 (L) 07/02/2016 0401   NA 139 05/15/2016 1233  K 3.8 07/02/2016 0401   K 3.8 07/02/2016 0401   K 4.4 05/15/2016 1233   CL 111 07/02/2016 0401   CO2 20 (L) 07/02/2016 0401   CO2 24 05/15/2016 1233   BUN 10 07/02/2016 0401   BUN 11.6 05/15/2016 1233   CREATININE 0.87 07/02/2016 0401   CREATININE 0.85 07/02/2016 0401   CREATININE 1.0 05/15/2016 1233      Component Value Date/Time   CALCIUM 8.3 (L) 07/02/2016 0401   CALCIUM 9.5 05/15/2016 1233   ALKPHOS 62 06/28/2016 0416   ALKPHOS 83 05/15/2016 1233   AST 16 06/28/2016 0416   AST 13 05/15/2016 1233   ALT 11 (L) 06/28/2016 0416   ALT <9 05/15/2016 1233   BILITOT 0.6 06/28/2016 0416   BILITOT 0.30 05/15/2016 1233     ASSESSMENT & PLAN:  Marginal zone lymphoma of axilla ([facility]) Her recent surgical specimen showed evidence of lymphoma involvement in the GI tract. We reviewed the current guidelines. I recommend we start her on immunotherapy. We discussed briefly the risk and benefit of starting her on weekly rituximab 4. I will like to give HER-2 more weeks of healing time with plan for chemotherapy consent and site of treatment within the next 2-3 weeks. She agreed with the plan of care  Essential hypertension she  will continue current medical management. I recommend close follow-up with primary care doctor for medication adjustment.    Orders Placed This Encounter  Procedures  . Comprehensive metabolic panel    Standing Status:   Future    Standing Expiration Date:   09/03/2017  . CBC with Differential/Platelet    Standing Status:   Future    Standing Expiration Date:   09/03/2017  . Lactate dehydrogenase    Standing Status:   Future    Standing Expiration Date:   09/03/2017  . Hepatitis B core antibody, IgM    Standing Status:   Future    Standing Expiration Date:   09/03/2017  . Hepatitis B surface antibody    Standing Status:   Future    Standing Expiration Date:   09/03/2017  . Hepatitis B surface antigen    Standing Status:   Future    Standing Expiration Date:   09/03/2017  . Uric acid    Standing Status:   Future    Standing Expiration Date:   09/03/2017   All questions were answered. The patient knows to call the clinic with any problems, questions or concerns. No barriers to learning was detected. I spent 15 minutes counseling the patient face to face. The total time spent in the appointment was 20 minutes and more than 50% was on counseling and review of test results     Heath Lark, MD 07/31/2016 2:43 PM

## 2016-07-31 NOTE — Assessment & Plan Note (Signed)
she will continue current medical management. I recommend close follow-up with primary care doctor for medication adjustment.  

## 2016-08-04 ENCOUNTER — Telehealth: Payer: Self-pay | Admitting: *Deleted

## 2016-08-04 ENCOUNTER — Telehealth: Payer: Self-pay | Admitting: Oncology

## 2016-08-04 NOTE — Telephone Encounter (Signed)
Called [name] back and let her know that [name]'s appointment with Dr. Sondra Come has been canceled for tomorrow.  [name] verbalized understanding and agreement.

## 2016-08-04 NOTE — Telephone Encounter (Signed)
"  We received appointment reminder call for radiation.  We were told she does not need RT and Dr. Alvy Bimler cancelled this appointment."  Call transferred ext [phone].

## 2016-08-05 ENCOUNTER — Ambulatory Visit
Admission: RE | Admit: 2016-08-05 | Payer: Medicare Other | Source: Ambulatory Visit | Attending: Radiation Oncology | Admitting: Radiation Oncology

## 2016-08-05 ENCOUNTER — Ambulatory Visit: Payer: Medicare Other

## 2016-08-12 ENCOUNTER — Telehealth: Payer: Self-pay | Admitting: Hematology and Oncology

## 2016-08-12 NOTE — Telephone Encounter (Signed)
10/13 Appointment time rescheduled per the patient/ daughter  Request. The daughter is the mode of transportation for the patient. She could not make the 9 am time but could work with the 9:15 AM.

## 2016-08-14 ENCOUNTER — Encounter: Payer: Self-pay | Admitting: *Deleted

## 2016-08-14 ENCOUNTER — Other Ambulatory Visit: Payer: Medicare Other

## 2016-08-14 ENCOUNTER — Other Ambulatory Visit (HOSPITAL_COMMUNITY)
Admission: RE | Admit: 2016-08-14 | Discharge: 2016-08-14 | Disposition: A | Discharge status: Home / Self Care | Payer: Medicare Other | Source: Ambulatory Visit | Attending: Hematology and Oncology | Admitting: Hematology and Oncology

## 2016-08-14 ENCOUNTER — Telehealth: Payer: Self-pay | Admitting: Hematology and Oncology

## 2016-08-14 ENCOUNTER — Encounter: Payer: Self-pay | Admitting: Hematology and Oncology

## 2016-08-14 ENCOUNTER — Ambulatory Visit (HOSPITAL_BASED_OUTPATIENT_CLINIC_OR_DEPARTMENT_OTHER): Payer: Medicare Other | Admitting: Hematology and Oncology

## 2016-08-14 ENCOUNTER — Other Ambulatory Visit (HOSPITAL_BASED_OUTPATIENT_CLINIC_OR_DEPARTMENT_OTHER): Payer: Medicare Other

## 2016-08-14 DIAGNOSIS — C8584 Other specified types of non-Hodgkin lymphoma, lymph nodes of axilla and upper limb: Secondary | ICD-10-CM | POA: Insufficient documentation

## 2016-08-14 DIAGNOSIS — I10 Essential (primary) hypertension: Secondary | ICD-10-CM

## 2016-08-14 DIAGNOSIS — D696 Thrombocytopenia, unspecified: Secondary | ICD-10-CM

## 2016-08-14 DIAGNOSIS — D72820 Lymphocytosis (symptomatic): Secondary | ICD-10-CM | POA: Diagnosis not present

## 2016-08-14 DIAGNOSIS — D638 Anemia in other chronic diseases classified elsewhere: Secondary | ICD-10-CM | POA: Diagnosis not present

## 2016-08-14 LAB — COMPREHENSIVE METABOLIC PANEL
ALBUMIN: 3.7 g/dL (ref 3.5–5.0)
ALK PHOS: 80 U/L (ref 40–150)
ALT: 9 U/L (ref 0–55)
AST: 17 U/L (ref 5–34)
Anion Gap: 8 mEq/L (ref 3–11)
BILIRUBIN TOTAL: 0.4 mg/dL (ref 0.20–1.20)
BUN: 19.2 mg/dL (ref 7.0–26.0)
CO2: 24 mEq/L (ref 22–29)
Calcium: 10.1 mg/dL (ref 8.4–10.4)
Chloride: 108 mEq/L (ref 98–109)
Creatinine: 1 mg/dL (ref 0.6–1.1)
EGFR: 60 mL/min/{1.73_m2} — AB (ref >90)
GLUCOSE: 86 mg/dL (ref 70–140)
Potassium: 4.7 mEq/L (ref 3.5–5.1)
SODIUM: 140 meq/L (ref 136–145)
TOTAL PROTEIN: 8.2 g/dL (ref 6.4–8.3)

## 2016-08-14 LAB — CBC WITH DIFFERENTIAL/PLATELET
BASO%: 0.1 % (ref 0.0–2.0)
Basophils Absolute: 0 10*3/uL (ref 0.0–0.1)
EOS ABS: 0.1 10*3/uL (ref 0.0–0.5)
EOS%: 0.5 % (ref 0.0–7.0)
HCT: 30 % — ABNORMAL LOW (ref 34.8–46.6)
HEMOGLOBIN: 9.6 g/dL — AB (ref 11.6–15.9)
LYMPH#: 6.8 10*3/uL — AB (ref 0.9–3.3)
LYMPH%: 73.6 % — ABNORMAL HIGH (ref 14.0–49.7)
MCH: 26.8 pg (ref 25.1–34.0)
MCHC: 32 g/dL (ref 31.5–36.0)
MCV: 83.8 fL (ref 79.5–101.0)
MONO#: 0.2 10*3/uL (ref 0.1–0.9)
MONO%: 2.6 % (ref 0.0–14.0)
NEUT%: 23.2 % — ABNORMAL LOW (ref 38.4–76.8)
NEUTROS ABS: 2.1 10*3/uL (ref 1.5–6.5)
PLATELETS: 132 10*3/uL — AB (ref 145–400)
RBC: 3.58 10*6/uL — ABNORMAL LOW (ref 3.70–5.45)
RDW: 14.7 % — AB (ref 11.2–14.5)
WBC: 9.2 10*3/uL (ref 3.9–10.3)

## 2016-08-14 LAB — URIC ACID: Uric Acid, Serum: 4.8 mg/dl (ref 2.6–7.4)

## 2016-08-14 LAB — LACTATE DEHYDROGENASE: LDH: 202 U/L (ref 125–245)

## 2016-08-14 MED ORDER — ALLOPURINOL 100 MG PO TABS: 100.0000 mg | ORAL_TABLET | Freq: Every day | ORAL | 0 refills | Status: DC

## 2016-08-14 NOTE — Assessment & Plan Note (Signed)
she will continue current medical management. I recommend close follow-up with primary care doctor for medication adjustment.  

## 2016-08-14 NOTE — Assessment & Plan Note (Signed)
We discussed the role of chemotherapy is of curative intent We discussed some of the risks, benefits and side-effects of Rituximab  Some of the short term side-effects included, though not limited to, risk of fatigue, weight loss, tumor lysis syndrome, risk of allergic reactions, pancytopenia, life-threatening infections, need for transfusions of blood products, nausea, vomiting, change in bowel habits, admission to hospital for various reasons, and risks of death.  The patient is aware that the response rates discussed earlier is not guaranteed.    After a long discussion, patient made an informed decision to proceed with the prescribed plan of care.  I will see her back on the fourth week of treatment to discuss the timing to repeat PET CT scan. I will start her on allopurinol for tumor lysis prophylaxis  Patient education material was dispensed

## 2016-08-14 NOTE — Telephone Encounter (Signed)
Avs report and appointment schedule given to patient, per 08/14/16 los. °

## 2016-08-14 NOTE — Progress Notes (Signed)
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[place])   05/18/2015 Imaging    Ct abdomen and pelvis showed reactive lymphadenopathy and stool filled colon      02/13/2016 Imaging    Ct chest showed pneumonia      05/08/2016 Imaging    screening mammogram revealed right axillary lymphadenopathy      05/12/2016 Procedure    She underwent core needle biopsy of axillary LN      05/12/2016 Pathology Results    [id number] Biopsy showed low grade non-Hodgkin B cell lymphoma, likely marginal zone lymphoma      05/15/2016 Pathology Results    Peripheral blood positive for deletion 13q      05/25/2016 PET scan    Mildly hypermetabolic mediastinal, left axillary and right inguinal lymph nodes. 2. Mild hypermetabolism in the left thyroid, nonspecific      06/27/2016 Imaging    Ct abdomen showed sigmoid volvulus resulting in large bowel obstruction. Splenomegaly, in keeping with patient's history of lymphoma.       06/30/2016 Pathology Results    Accession: [id number] pathology specimen showed low grade lymphoma      06/30/2016 Surgery    She had LAPAROSCOPIC SIGMOID COLECTOMY,  Rigid sigmoidoscopy, with Primary repair recurrent incisional hernia          INTERVAL HISTORY: Please see below for problem oriented charting. She returns today for chemotherapy consents. She is feels better. Her appetite has improved. She denies pain. No nausea or vomiting. No recent infection. The patient denies any recent signs or symptoms of bleeding such as spontaneous epistaxis, hematuria or hematochezia.  REVIEW OF SYSTEMS:    Constitutional: Denies fevers, chills or abnormal weight loss Eyes: Denies blurriness of vision Ears, nose, mouth, throat, and face: Denies mucositis or sore throat Respiratory: Denies cough, dyspnea or wheezes Cardiovascular: Denies palpitation, chest discomfort or lower extremity swelling Gastrointestinal:  Denies nausea, heartburn or change in bowel habits Skin: Denies abnormal skin rashes Lymphatics: Denies new lymphadenopathy or easy bruising Neurological:Denies numbness, tingling or new weaknesses Behavioral/Psych: Mood is stable, no new changes  All other systems were reviewed with the patient and are negative.  I have reviewed the past medical history, past surgical history, social history and family history with the patient and they are unchanged from previous note.  ALLERGIES:  is allergic to morphine and related.  MEDICATIONS:  Current Outpatient Prescriptions  Medication Sig Dispense Refill  . allopurinol (ZYLOPRIM) 100 MG tablet Take 1 tablet (100 mg total) by mouth daily. 30 tablet 0  . aspirin EC 81 MG tablet Take 1 tablet (81 mg total) by mouth daily. 30 tablet 0  . feeding supplement, ENSURE ENLIVE, (ENSURE ENLIVE) LIQD Take 237 mLs by mouth 2 (two) times daily between meals. 237 mL 12  . furosemide (LASIX) 20 MG tablet Take 20 mg by mouth daily as needed for fluid.    . hydrALAZINE (APRESOLINE) 25 MG tablet Take 1 tablet (25 mg total) by mouth every 8 (eight) hours. 90 tablet 0  . Loratadine (CLARITIN PO) Take 10 mg by mouth.    . methocarbamol (ROBAXIN) 500 MG tablet Take 1 tablet (500 mg total) by mouth every 6 (  six) hours as needed for muscle spasms. 30 tablet 0  . pantoprazole (PROTONIX) 40 MG tablet Take 1 tablet (40 mg total) by mouth daily. 30 tablet 0  . polyethylene glycol (MIRALAX / GLYCOLAX) packet Take 17 g by mouth 2 (two) times daily. 14 each 0  . prednisoLONE acetate (PRED FORTE) 1 % ophthalmic suspension Place 1-2 drops into both eyes See admin  instructions. 2 drops in right eye and 1 drop in the left eye twice daily    . sertraline (ZOLOFT) 25 MG tablet Take 1 tablet (25 mg total) by mouth daily. Reported on 05/19/2016 30 tablet 0  . traMADol (ULTRAM) 50 MG tablet Take 1 tablet (50 mg total) by mouth every 6 (six) hours as needed for moderate pain or severe pain. 20 tablet 0   No current facility-administered medications for this visit.     PHYSICAL EXAMINATION: ECOG PERFORMANCE STATUS: 2 - Symptomatic, <50% confined to bed  Vitals:   08/14/16 1159  BP: (!) 180/93  Pulse: 87  Resp: 17  Temp: 98.3 F (36.8 C)   Filed Weights   08/14/16 1159  Weight: 137 lb 11.2 oz (62.5 kg)    GENERAL:alert, no distress and comfortable SKIN: skin color, texture, turgor are normal, no rashes or significant lesions EYES: normal, Conjunctiva are pink and non-injected, sclera clear OROPHARYNX:no exudate, no erythema and lips, buccal mucosa, and tongue normal  NECK: supple, thyroid normal size, non-tender, without nodularity LYMPH:  no palpable lymphadenopathy in the cervical, axillary or inguinal LUNGS: clear to auscultation and percussion with normal breathing effort HEART: regular rate & rhythm and no murmurs and no lower extremity edema ABDOMEN:abdomen soft, non-tender and normal bowel sounds Musculoskeletal:no cyanosis of digits and no clubbing  NEURO: alert & oriented x 3 with fluent speech, no focal motor/sensory deficits  LABORATORY DATA:  I have reviewed the data as listed    Component Value Date/Time   NA 140 08/14/2016 0926   K 4.7 08/14/2016 0926   CL 111 07/02/2016 0401   CO2 24 08/14/2016 0926   GLUCOSE 86 08/14/2016 0926   BUN 19.2 08/14/2016 0926   CREATININE 1.0 08/14/2016 0926   CALCIUM 10.1 08/14/2016 0926   PROT 8.2 08/14/2016 0926   ALBUMIN 3.7 08/14/2016 0926   AST 17 08/14/2016 0926   ALT 9 08/14/2016 0926   ALKPHOS 80 08/14/2016 0926   BILITOT 0.40 08/14/2016 0926   GFRNONAA 59 (L) 07/02/2016 0401    GFRNONAA >60 07/02/2016 0401   GFRAA >60 07/02/2016 0401   GFRAA >60 07/02/2016 0401    No results found for: SPEP, UPEP  Lab Results  Component Value Date   WBC 9.2 08/14/2016   NEUTROABS 2.1 08/14/2016   HGB 9.6 (L) 08/14/2016   HCT 30.0 (L) 08/14/2016   MCV 83.8 08/14/2016   PLT 132 (L) 08/14/2016      Chemistry      Component Value Date/Time   NA 140 08/14/2016 0926   K 4.7 08/14/2016 0926   CL 111 07/02/2016 0401   CO2 24 08/14/2016 0926   BUN 19.2 08/14/2016 0926   CREATININE 1.0 08/14/2016 0926      Component Value Date/Time   CALCIUM 10.1 08/14/2016 0926   ALKPHOS 80 08/14/2016 0926   AST 17 08/14/2016 0926   ALT 9 08/14/2016 0926   BILITOT 0.40 08/14/2016 0926     ASSESSMENT & PLAN:  Marginal zone lymphoma of axilla (HCC) We discussed the role of chemotherapy is of curative intent We  discussed some of the risks, benefits and side-effects of Rituximab  Some of the short term side-effects included, though not limited to, risk of fatigue, weight loss, tumor lysis syndrome, risk of allergic reactions, pancytopenia, life-threatening infections, need for transfusions of blood products, nausea, vomiting, change in bowel habits, admission to hospital for various reasons, and risks of death.  The patient is aware that the response rates discussed earlier is not guaranteed.    After a long discussion, patient made an informed decision to proceed with the prescribed plan of care.  I will see her back on the fourth week of treatment to discuss the timing to repeat PET CT scan. I will start her on allopurinol for tumor lysis prophylaxis  Patient education material was dispensed    Essential hypertension she will continue current medical management. I recommend close follow-up with primary care doctor for medication adjustment.   Anemia due to chronic illness This is likely anemia of chronic disease. The patient denies recent history of bleeding such as epistaxis,  hematuria or hematochezia. She is asymptomatic from the anemia. We will observe for now.  She does not require transfusion now.    Thrombocytopenia ([place]) The cause is likely related to marginal zone lymphoma. It is mild and there is little change compared from previous platelet count. The patient denies recent history of bleeding such as epistaxis, hematuria or hematochezia. She is asymptomatic from the thrombocytopenia. I will observe for now.  she does not require transfusion now.      No orders of the defined types were placed in this encounter.  All questions were answered. The patient knows to call the clinic with any problems, questions or concerns. No barriers to learning was detected. I spent 25 minutes counseling the patient face to face. The total time spent in the appointment was 40 minutes and more than 50% was on counseling and review of test results     Heath Lark, MD 08/14/2016 2:27 PM

## 2016-08-14 NOTE — Assessment & Plan Note (Signed)
This is likely anemia of chronic disease. The patient denies recent history of bleeding such as epistaxis, hematuria or hematochezia. She is asymptomatic from the anemia. We will observe for now.  She does not require transfusion now.   

## 2016-08-14 NOTE — Assessment & Plan Note (Signed)
The cause is likely related to marginal zone lymphoma. It is mild and there is little change compared from previous platelet count. The patient denies recent history of bleeding such as epistaxis, hematuria or hematochezia. She is asymptomatic from the thrombocytopenia. I will observe for now.  she does not require transfusion now.

## 2016-08-15 LAB — HEPATITIS B SURFACE ANTIBODY,QUALITATIVE: Hep B Surface Ab, Qual: NONREACTIVE

## 2016-08-15 LAB — HEPATITIS B SURFACE ANTIGEN: HEP B S AG: NEGATIVE

## 2016-08-15 LAB — HEPATITIS B CORE ANTIBODY, IGM: Hep B Core Ab, IgM: NEGATIVE

## 2016-08-18 LAB — FLOW CYTOMETRY

## 2016-08-20 ENCOUNTER — Telehealth: Payer: Self-pay | Admitting: *Deleted

## 2016-08-20 ENCOUNTER — Encounter: Payer: Self-pay | Admitting: Hematology and Oncology

## 2016-08-20 ENCOUNTER — Ambulatory Visit (HOSPITAL_BASED_OUTPATIENT_CLINIC_OR_DEPARTMENT_OTHER): Payer: Medicare Other | Admitting: Hematology and Oncology

## 2016-08-20 DIAGNOSIS — K644 Residual hemorrhoidal skin tags: Secondary | ICD-10-CM | POA: Diagnosis not present

## 2016-08-20 DIAGNOSIS — K5909 Other constipation: Secondary | ICD-10-CM

## 2016-08-20 DIAGNOSIS — C8584 Other specified types of non-Hodgkin lymphoma, lymph nodes of axilla and upper limb: Secondary | ICD-10-CM

## 2016-08-20 NOTE — Assessment & Plan Note (Signed)
She has recent straining of bowel movements causing bleeding external hemorrhoids. Examination today showed no evidence of active bleeding. I recommend conservative local management

## 2016-08-20 NOTE — Progress Notes (Signed)
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[place])   05/18/2015 Imaging    Ct abdomen and pelvis showed reactive lymphadenopathy and stool filled colon      02/13/2016 Imaging    Ct chest showed pneumonia      05/08/2016 Imaging    screening mammogram revealed right axillary lymphadenopathy      05/12/2016 Procedure    She underwent core needle biopsy of axillary LN      05/12/2016 Pathology Results    [id number] Biopsy showed low grade non-Hodgkin B cell lymphoma, likely marginal zone lymphoma      05/15/2016 Pathology Results    Peripheral blood positive for deletion 13q      05/25/2016 PET scan    Mildly hypermetabolic mediastinal, left axillary and right inguinal lymph nodes. 2. Mild hypermetabolism in the left thyroid, nonspecific      06/27/2016 Imaging    Ct abdomen showed sigmoid volvulus resulting in large bowel obstruction. Splenomegaly, in keeping with patient's history of lymphoma.       06/30/2016 Pathology Results    Accession: [id number] pathology specimen showed low grade lymphoma      06/30/2016 Surgery    She had LAPAROSCOPIC SIGMOID COLECTOMY,  Rigid sigmoidoscopy, with Primary repair recurrent incisional hernia          INTERVAL HISTORY: Please see below for problem oriented charting. She is seen urgently today because of bleeding external hemorrhoids. The patient was informed to come up here for urgent evaluation if she has active bleeding. The patient has chronic constipation. She had recent days without bowel movement and then strained for her bowel movement last night.  She has  passage of bright red blood per rectum She denies nausea. No recent spontaneous epistaxis or hematuria She claims she is taking regular laxatives. She has plenty of oral fluid intake and eat applesauce 3 times a day on a regular basis with overcooked beans  REVIEW OF SYSTEMS:   Constitutional: Denies fevers, chills or abnormal weight loss Eyes: Denies blurriness of vision Ears, nose, mouth, throat, and face: Denies mucositis or sore throat Respiratory: Denies cough, dyspnea or wheezes Cardiovascular: Denies palpitation, chest discomfort or lower extremity swelling Skin: Denies abnormal skin rashes Lymphatics: Denies new lymphadenopathy or easy bruising Neurological:Denies numbness, tingling or new weaknesses Behavioral/Psych: Mood is stable, no new changes  All other systems were reviewed with the patient and are negative.  I have reviewed the past medical history, past surgical history, social history and family history with the patient and they are unchanged from previous note.  ALLERGIES:  is allergic to morphine and related.  MEDICATIONS:  Current Outpatient Prescriptions  Medication Sig Dispense Refill  . allopurinol (ZYLOPRIM) 100 MG tablet Take 1 tablet (100 mg total) by mouth daily. 30 tablet 0  . aspirin EC 81 MG tablet Take 1 tablet (81 mg total) by mouth daily. 30 tablet 0  . feeding supplement, ENSURE ENLIVE, (ENSURE ENLIVE) LIQD Take 237 mLs by mouth 2 (two) times daily between meals. 237 mL 12  . furosemide (LASIX) 20 MG tablet Take 20 mg by mouth daily as needed for fluid.    . hydrALAZINE (APRESOLINE) 25 MG tablet Take  1 tablet (25 mg total) by mouth every 8 (eight) hours. 90 tablet 0  . Loratadine (CLARITIN PO) Take 10 mg by mouth.    . methocarbamol (ROBAXIN) 500 MG tablet Take 1 tablet (500 mg total) by mouth every 6 (six) hours as needed for muscle spasms. 30 tablet 0  . pantoprazole (PROTONIX) 40 MG tablet Take 1 tablet (40 mg total) by mouth daily. 30 tablet 0  .  polyethylene glycol (MIRALAX / GLYCOLAX) packet Take 17 g by mouth 2 (two) times daily. 14 each 0  . prednisoLONE acetate (PRED FORTE) 1 % ophthalmic suspension Place 1-2 drops into both eyes See admin instructions. 2 drops in right eye and 1 drop in the left eye twice daily    . sertraline (ZOLOFT) 25 MG tablet Take 1 tablet (25 mg total) by mouth daily. Reported on 05/19/2016 30 tablet 0  . traMADol (ULTRAM) 50 MG tablet Take 1 tablet (50 mg total) by mouth every 6 (six) hours as needed for moderate pain or severe pain. 20 tablet 0   No current facility-administered medications for this visit.     PHYSICAL EXAMINATION: ECOG PERFORMANCE STATUS: 1 - Symptomatic but completely ambulatory  Vitals:   08/20/16 1314  BP: (!) 153/70  Pulse: 75  Resp: 17  Temp: 98.2 F (36.8 C)   Filed Weights   08/20/16 1314  Weight: 136 lb 1.6 oz (61.7 kg)    GENERAL:alert, no distress and comfortable SKIN: skin color, texture, turgor are normal, no rashes or significant lesions EYES: normal, Conjunctiva are pink and non-injected, sclera clear ABDOMEN:abdomen soft, non-tender and normal bowel sounds Musculoskeletal:no cyanosis of digits and no clubbing  NEURO: alert & oriented x 3 with fluent speech, no focal motor/sensory deficits Examination of the external genitalia showed external hemorrhoids with no active bleeding LABORATORY DATA:  I have reviewed the data as listed    Component Value Date/Time   NA 140 08/14/2016 0926   K 4.7 08/14/2016 0926   CL 111 07/02/2016 0401   CO2 24 08/14/2016 0926   GLUCOSE 86 08/14/2016 0926   BUN 19.2 08/14/2016 0926   CREATININE 1.0 08/14/2016 0926   CALCIUM 10.1 08/14/2016 0926   PROT 8.2 08/14/2016 0926   ALBUMIN 3.7 08/14/2016 0926   AST 17 08/14/2016 0926   ALT 9 08/14/2016 0926   ALKPHOS 80 08/14/2016 0926   BILITOT 0.40 08/14/2016 0926   GFRNONAA 59 (L) 07/02/2016 0401   GFRNONAA >60 07/02/2016 0401   GFRAA >60 07/02/2016 0401   GFRAA >60  07/02/2016 0401    No results found for: SPEP, UPEP  Lab Results  Component Value Date   WBC 9.2 08/14/2016   NEUTROABS 2.1 08/14/2016   HGB 9.6 (L) 08/14/2016   HCT 30.0 (L) 08/14/2016   MCV 83.8 08/14/2016   PLT 132 (L) 08/14/2016      Chemistry      Component Value Date/Time   NA 140 08/14/2016 0926   K 4.7 08/14/2016 0926   CL 111 07/02/2016 0401   CO2 24 08/14/2016 0926   BUN 19.2 08/14/2016 0926   CREATININE 1.0 08/14/2016 0926      Component Value Date/Time   CALCIUM 10.1 08/14/2016 0926   ALKPHOS 80 08/14/2016 0926   AST 17 08/14/2016 0926   ALT 9 08/14/2016 0926   BILITOT 0.40 08/14/2016 0926      ASSESSMENT & PLAN:  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  Bleeding external hemorrhoids She has  recent straining of bowel movements causing bleeding external hemorrhoids. Examination today showed no evidence of active bleeding. I recommend conservative local management  Constipation The patient has history of chronic constipation. I recommend increasing dietary fruits & vegetables and to take regular laxative to avoid getting constipated. The patient is drinking lots of fluid   No orders of the defined types were placed in this encounter.  All questions were answered. The patient knows to call the clinic with any problems, questions or concerns. No barriers to learning was detected. I spent 15 minutes counseling the patient face to face. The total time spent in the appointment was 20 minutes and more than 50% was on counseling and review of test results     Heath Lark, MD 08/20/2016 1:36 PM

## 2016-08-20 NOTE — Assessment & Plan Note (Signed)
The patient has history of chronic constipation. I recommend increasing dietary fruits & vegetables and to take regular laxative to avoid getting constipated. The patient is drinking lots of fluid

## 2016-08-20 NOTE — Telephone Encounter (Signed)
VM from "[name]" states she thought pt was going to the hospital and she is trying to find pt... She did not leave a phone number to call back.  I found number for [name] under contacts and left her a message informing pt saw Dr. Alvy Bimler today and then left.  She was ok when she was in our office.   Please call us back if any further questions or concerns.

## 2016-08-20 NOTE — Assessment & Plan Note (Signed)
She will begin treatment tomorrow.

## 2016-08-21 ENCOUNTER — Ambulatory Visit (HOSPITAL_BASED_OUTPATIENT_CLINIC_OR_DEPARTMENT_OTHER): Payer: Medicare Other

## 2016-08-21 VITALS — BP 125/50 | HR 94 | Temp 98.3°F | Resp 18

## 2016-08-21 DIAGNOSIS — C8584 Other specified types of non-Hodgkin lymphoma, lymph nodes of axilla and upper limb: Secondary | ICD-10-CM | POA: Diagnosis not present

## 2016-08-21 DIAGNOSIS — Z5112 Encounter for antineoplastic immunotherapy: Secondary | ICD-10-CM

## 2016-08-21 MED ORDER — SODIUM CHLORIDE 0.9 % IV SOLN
375.0000 mg/m2 | Freq: Once | INTRAVENOUS | Status: AC
  Administered 2016-08-21: 600 mg via INTRAVENOUS
  Filled 2016-08-21: qty 50

## 2016-08-21 MED ORDER — ACETAMINOPHEN 325 MG PO TABS
ORAL_TABLET | ORAL | Status: AC
  Filled 2016-08-21: qty 2

## 2016-08-21 MED ORDER — DIPHENHYDRAMINE HCL 25 MG PO CAPS
ORAL_CAPSULE | ORAL | Status: AC
  Filled 2016-08-21: qty 2

## 2016-08-21 MED ORDER — ACETAMINOPHEN 325 MG PO TABS
650.0000 mg | ORAL_TABLET | Freq: Once | ORAL | Status: AC
  Administered 2016-08-21: 650 mg via ORAL

## 2016-08-21 MED ORDER — DIPHENHYDRAMINE HCL 25 MG PO CAPS
50.0000 mg | ORAL_CAPSULE | Freq: Once | ORAL | Status: AC
  Administered 2016-08-21: 50 mg via ORAL

## 2016-08-21 MED ORDER — SODIUM CHLORIDE 0.9 % IV SOLN
Freq: Once | INTRAVENOUS | Status: AC
  Administered 2016-08-21: 09:00:00 via INTRAVENOUS

## 2016-08-21 NOTE — Patient Instructions (Signed)
[facility] Discharge Instructions for Patients Receiving Chemotherapy  Today you received the following chemotherapy agents Rituxan  To help prevent nausea and vomiting after your treatment, we encourage you to take your nausea medication as directed.    If you develop nausea and vomiting that is not controlled by your nausea medication, call the clinic.   BELOW ARE SYMPTOMS THAT SHOULD BE REPORTED IMMEDIATELY:  *FEVER GREATER THAN 100.5 F  *CHILLS WITH OR WITHOUT FEVER  NAUSEA AND VOMITING THAT IS NOT CONTROLLED WITH YOUR NAUSEA MEDICATION  *UNUSUAL SHORTNESS OF BREATH  *UNUSUAL BRUISING OR BLEEDING  TENDERNESS IN MOUTH AND THROAT WITH OR WITHOUT PRESENCE OF ULCERS  *URINARY PROBLEMS  *BOWEL PROBLEMS  UNUSUAL RASH Items with * indicate a potential emergency and should be followed up as soon as possible.  Feel free to call the clinic you have any questions or concerns. The clinic phone number is [phone].  Please show the [place] at check-in to the Emergency Department and triage nurse.  Rituximab injection What is this medicine? RITUXIMAB (ri TUX i mab) is a monoclonal antibody. It is used commonly to treat non-Hodgkin lymphoma and other conditions. It is also used to treat rheumatoid arthritis (RA). In RA, this medicine slows the inflammatory process and help reduce joint pain and swelling. This medicine is often used with other cancer or arthritis medications. This medicine may be used for other purposes; ask your health care provider or pharmacist if you have questions. What should I tell my health care provider before I take this medicine? They need to know if you have any of these conditions: -blood disorders -heart disease -history of hepatitis B -infection (especially a virus infection such as chickenpox, cold sores, or herpes) -irregular heartbeat -kidney disease -lung or breathing disease, like asthma -lupus -an unusual or  allergic reaction to rituximab, mouse proteins, other medicines, foods, dyes, or preservatives -pregnant or trying to get pregnant -breast-feeding How should I use this medicine? This medicine is for infusion into a vein. It is administered in a hospital or clinic by a specially trained health care professional. A special MedGuide will be given to you by the pharmacist with each prescription and refill. Be sure to read this information carefully each time. Talk to your pediatrician regarding the use of this medicine in children. This medicine is not approved for use in children. Overdosage: If you think you have taken too much of this medicine contact a poison control center or emergency room at once. NOTE: This medicine is only for you. Do not share this medicine with others. What if I miss a dose? It is important not to miss a dose. Call your doctor or health care professional if you are unable to keep an appointment. What may interact with this medicine? -cisplatin -medicines for blood pressure -some other medicines for arthritis -vaccines This list may not describe all possible interactions. Give your health care provider a list of all the medicines, herbs, non-prescription drugs, or dietary supplements you use. Also tell them if you smoke, drink alcohol, or use illegal drugs. Some items may interact with your medicine. What should I watch for while using this medicine? Report any side effects that you notice during your treatment right away, such as changes in your breathing, fever, chills, dizziness or lightheadedness. These effects are more common with the first dose. Visit your prescriber or health care professional for checks on your progress. You will need to have regular blood work. Report  any other side effects. The side effects of this medicine can continue after you finish your treatment. Continue your course of treatment even though you feel ill unless your doctor tells you to  stop. Call your doctor or health care professional for advice if you get a fever, chills or sore throat, or other symptoms of a cold or flu. Do not treat yourself. This drug decreases your body's ability to fight infections. Try to avoid being around people who are sick. This medicine may increase your risk to bruise or bleed. Call your doctor or health care professional if you notice any unusual bleeding. Be careful brushing and flossing your teeth or using a toothpick because you may get an infection or bleed more easily. If you have any dental work done, tell your dentist you are receiving this medicine. Avoid taking products that contain aspirin, acetaminophen, ibuprofen, naproxen, or ketoprofen unless instructed by your doctor. These medicines may hide a fever. Do not become pregnant while taking this medicine. Women should inform their doctor if they wish to become pregnant or think they might be pregnant. There is a potential for serious side effects to an unborn child. Talk to your health care professional or pharmacist for more information. Do not breast-feed an infant while taking this medicine. What side effects may I notice from receiving this medicine? Side effects that you should report to your doctor or health care professional as soon as possible: -allergic reactions like skin rash, itching or hives, swelling of the face, lips, or tongue -low blood counts - this medicine may decrease the number of white blood cells, red blood cells and platelets. You may be at increased risk for infections and bleeding. -signs of infection - fever or chills, cough, sore throat, pain or difficulty passing urine -signs of decreased platelets or bleeding - bruising, pinpoint red spots on the skin, black, tarry stools, blood in the urine -signs of decreased red blood cells - unusually weak or tired, fainting spells, lightheadedness -breathing problems -confused, not responsive -chest pain -fast, irregular  heartbeat -feeling faint or lightheaded, falls -mouth sores -redness, blistering, peeling or loosening of the skin, including inside the mouth -stomach pain -swelling of the ankles, feet, or hands -trouble passing urine or change in the amount of urine Side effects that usually do not require medical attention (report to your doctor or other health care professional if they continue or are bothersome): -anxiety -headache -loss of appetite -muscle aches -nausea -night sweats This list may not describe all possible side effects. Call your doctor for medical advice about side effects. You may report side effects to FDA at 1-800-FDA-1088. Where should I keep my medicine? This drug is given in a hospital or clinic and will not be stored at home. NOTE: This sheet is a summary. It may not cover all possible information. If you have questions about this medicine, talk to your doctor, pharmacist, or health care provider.    2016, [employer] Standard. (2014-12-26 22:30:56)

## 2016-08-25 ENCOUNTER — Telehealth: Payer: Self-pay | Admitting: *Deleted

## 2016-08-25 NOTE — Telephone Encounter (Signed)
She has hemorrhoids on recent evaluation Recommend stop aspirin, use Sitz bath and call in anusol cream to use BID

## 2016-08-25 NOTE — Telephone Encounter (Signed)
A Triage Nurse spoke with them and has provided these instructions

## 2016-08-25 NOTE — Telephone Encounter (Signed)
"  I had blood in the commode yesterday and earlier today.  I take Erika Day daily. Have soft bowels daily.  After the BM I expel air and blood come out.  I see it on the tissue.  No blood is in the [place].  I thought I should let the doctor know.  08-28-2016 is my next infusion.  Return number [phone].  I have cramps but no swelling, bloating or pain to my abdomen.  My rectal area hurts. "

## 2016-08-27 ENCOUNTER — Other Ambulatory Visit: Payer: Medicare Other

## 2016-08-27 ENCOUNTER — Ambulatory Visit: Payer: Medicare Other | Admitting: Hematology and Oncology

## 2016-08-28 ENCOUNTER — Ambulatory Visit (HOSPITAL_BASED_OUTPATIENT_CLINIC_OR_DEPARTMENT_OTHER): Payer: Medicare Other

## 2016-08-28 VITALS — BP 165/77 | HR 75 | Temp 98.3°F | Resp 17

## 2016-08-28 DIAGNOSIS — C8584 Other specified types of non-Hodgkin lymphoma, lymph nodes of axilla and upper limb: Secondary | ICD-10-CM

## 2016-08-28 DIAGNOSIS — Z5112 Encounter for antineoplastic immunotherapy: Secondary | ICD-10-CM | POA: Diagnosis not present

## 2016-08-28 MED ORDER — SODIUM CHLORIDE 0.9 % IV SOLN
375.0000 mg/m2 | Freq: Once | INTRAVENOUS | Status: AC
  Administered 2016-08-28: 600 mg via INTRAVENOUS
  Filled 2016-08-28: qty 50

## 2016-08-28 MED ORDER — ACETAMINOPHEN 325 MG PO TABS
ORAL_TABLET | ORAL | Status: AC
  Filled 2016-08-28: qty 2

## 2016-08-28 MED ORDER — DIPHENHYDRAMINE HCL 25 MG PO CAPS
50.0000 mg | ORAL_CAPSULE | Freq: Once | ORAL | Status: AC
  Administered 2016-08-28: 50 mg via ORAL

## 2016-08-28 MED ORDER — PROCHLORPERAZINE MALEATE 10 MG PO TABS: 10.0000 mg | ORAL_TABLET | Freq: Four times a day (QID) | ORAL | 1 refills | Status: DC | PRN

## 2016-08-28 MED ORDER — SODIUM CHLORIDE 0.9 % IV SOLN
Freq: Once | INTRAVENOUS | Status: AC
  Administered 2016-08-28: 09:00:00 via INTRAVENOUS

## 2016-08-28 MED ORDER — ACETAMINOPHEN 325 MG PO TABS
650.0000 mg | ORAL_TABLET | Freq: Once | ORAL | Status: AC
  Administered 2016-08-28: 650 mg via ORAL

## 2016-08-28 MED ORDER — DIPHENHYDRAMINE HCL 25 MG PO CAPS
ORAL_CAPSULE | ORAL | Status: AC
  Filled 2016-08-28: qty 2

## 2016-08-28 NOTE — Patient Instructions (Signed)
[facility] Discharge Instructions for Patients Receiving Chemotherapy  Today you received the following chemotherapy agents Rituxan To help prevent nausea and vomiting after your treatment, we encourage you to take your nausea medication as prescribed.  If you develop nausea and vomiting that is not controlled by your nausea medication, call the clinic.   BELOW ARE SYMPTOMS THAT SHOULD BE REPORTED IMMEDIATELY:  *FEVER GREATER THAN 100.5 F  *CHILLS WITH OR WITHOUT FEVER  NAUSEA AND VOMITING THAT IS NOT CONTROLLED WITH YOUR NAUSEA MEDICATION  *UNUSUAL SHORTNESS OF BREATH  *UNUSUAL BRUISING OR BLEEDING  TENDERNESS IN MOUTH AND THROAT WITH OR WITHOUT PRESENCE OF ULCERS  *URINARY PROBLEMS  *BOWEL PROBLEMS  UNUSUAL RASH Items with * indicate a potential emergency and should be followed up as soon as possible.  Feel free to call the clinic you have any questions or concerns. The clinic phone number is [phone].  Please show the CHEMO ALERT CARD at check-in to the Emergency Department and triage nurse.   

## 2016-08-28 NOTE — Progress Notes (Unsigned)
Pt reports severe nausea for "about 5 hours" the evening after her first Rituxan infusion.  Because of this, this RN chose to increase rate slightly slower than rapid infusion.  Rapid rate of 248mls hour eventually achieved and patient tolerated well.

## 2016-09-04 ENCOUNTER — Ambulatory Visit (HOSPITAL_BASED_OUTPATIENT_CLINIC_OR_DEPARTMENT_OTHER): Payer: Medicare Other

## 2016-09-04 VITALS — BP 170/78 | HR 59 | Temp 97.9°F | Resp 16

## 2016-09-04 DIAGNOSIS — Z5112 Encounter for antineoplastic immunotherapy: Secondary | ICD-10-CM | POA: Diagnosis not present

## 2016-09-04 DIAGNOSIS — C8584 Other specified types of non-Hodgkin lymphoma, lymph nodes of axilla and upper limb: Secondary | ICD-10-CM | POA: Diagnosis not present

## 2016-09-04 DIAGNOSIS — C8304 Small cell B-cell lymphoma, lymph nodes of axilla and upper limb: Secondary | ICD-10-CM

## 2016-09-04 MED ORDER — DIPHENHYDRAMINE HCL 25 MG PO CAPS
50.0000 mg | ORAL_CAPSULE | Freq: Once | ORAL | Status: AC
  Administered 2016-09-04: 50 mg via ORAL

## 2016-09-04 MED ORDER — SODIUM CHLORIDE 0.9 % IV SOLN
375.0000 mg/m2 | Freq: Once | INTRAVENOUS | Status: AC
  Administered 2016-09-04: 600 mg via INTRAVENOUS
  Filled 2016-09-04: qty 50

## 2016-09-04 MED ORDER — ACETAMINOPHEN 325 MG PO TABS
650.0000 mg | ORAL_TABLET | Freq: Once | ORAL | Status: AC
  Administered 2016-09-04: 650 mg via ORAL

## 2016-09-04 MED ORDER — ACETAMINOPHEN 325 MG PO TABS
ORAL_TABLET | ORAL | Status: AC
  Filled 2016-09-04: qty 2

## 2016-09-04 MED ORDER — PROCHLORPERAZINE MALEATE 10 MG PO TABS: 10.0000 mg | ORAL_TABLET | Freq: Once | ORAL | Status: DC

## 2016-09-04 MED ORDER — PROCHLORPERAZINE MALEATE 10 MG PO TABS
ORAL_TABLET | ORAL | Status: AC
  Filled 2016-09-04: qty 1

## 2016-09-04 MED ORDER — SODIUM CHLORIDE 0.9 % IV SOLN
Freq: Once | INTRAVENOUS | Status: AC
  Administered 2016-09-04: 10:00:00 via INTRAVENOUS

## 2016-09-04 MED ORDER — PROCHLORPERAZINE MALEATE 10 MG PO TABS
10.0000 mg | ORAL_TABLET | Freq: Four times a day (QID) | ORAL | Status: DC | PRN
  Administered 2016-09-04: 10 mg via ORAL

## 2016-09-04 MED ORDER — DIPHENHYDRAMINE HCL 25 MG PO CAPS
ORAL_CAPSULE | ORAL | Status: AC
  Filled 2016-09-04: qty 2

## 2016-09-04 NOTE — Progress Notes (Signed)
1245-Patient instructed to take Lisinopril as prescribed when she gets home today.  Patient verbalizes an understanding of instructions.  Teach back done.

## 2016-09-04 NOTE — Patient Instructions (Signed)
[facility] Discharge Instructions for Patients Receiving Chemotherapy  Today you received the following chemotherapy agents:  Rituxan.  To help prevent nausea and vomiting after your treatment, we encourage you to take your nausea medication as ordered per MD.   If you develop nausea and vomiting that is not controlled by your nausea medication, call the clinic.   BELOW ARE SYMPTOMS THAT SHOULD BE REPORTED IMMEDIATELY:  *FEVER GREATER THAN 100.5 F  *CHILLS WITH OR WITHOUT FEVER  NAUSEA AND VOMITING THAT IS NOT CONTROLLED WITH YOUR NAUSEA MEDICATION  *UNUSUAL SHORTNESS OF BREATH  *UNUSUAL BRUISING OR BLEEDING  TENDERNESS IN MOUTH AND THROAT WITH OR WITHOUT PRESENCE OF ULCERS  *URINARY PROBLEMS  *BOWEL PROBLEMS  UNUSUAL RASH Items with * indicate a potential emergency and should be followed up as soon as possible.  Feel free to call the clinic you have any questions or concerns. The clinic phone number is [phone].  Please show the CHEMO ALERT CARD at check-in to the Emergency Department and triage nurse.   

## 2016-09-11 ENCOUNTER — Telehealth: Payer: Self-pay | Admitting: Hematology and Oncology

## 2016-09-11 ENCOUNTER — Ambulatory Visit (HOSPITAL_BASED_OUTPATIENT_CLINIC_OR_DEPARTMENT_OTHER): Payer: Medicare Other | Admitting: Hematology and Oncology

## 2016-09-11 ENCOUNTER — Encounter: Payer: Self-pay | Admitting: Hematology and Oncology

## 2016-09-11 ENCOUNTER — Ambulatory Visit (HOSPITAL_BASED_OUTPATIENT_CLINIC_OR_DEPARTMENT_OTHER): Payer: Medicare Other

## 2016-09-11 VITALS — BP 161/79 | HR 78 | Temp 98.5°F | Resp 17

## 2016-09-11 VITALS — BP 170/78 | HR 82 | Temp 98.9°F | Resp 18

## 2016-09-11 DIAGNOSIS — C8584 Other specified types of non-Hodgkin lymphoma, lymph nodes of axilla and upper limb: Secondary | ICD-10-CM

## 2016-09-11 DIAGNOSIS — I10 Essential (primary) hypertension: Secondary | ICD-10-CM

## 2016-09-11 DIAGNOSIS — Z5112 Encounter for antineoplastic immunotherapy: Secondary | ICD-10-CM

## 2016-09-11 DIAGNOSIS — K5909 Other constipation: Secondary | ICD-10-CM | POA: Diagnosis not present

## 2016-09-11 DIAGNOSIS — C8304 Small cell B-cell lymphoma, lymph nodes of axilla and upper limb: Secondary | ICD-10-CM

## 2016-09-11 MED ORDER — SODIUM CHLORIDE 0.9 % IV SOLN
Freq: Once | INTRAVENOUS | Status: AC
  Administered 2016-09-11: 09:00:00 via INTRAVENOUS

## 2016-09-11 MED ORDER — SODIUM CHLORIDE 0.9 % IV SOLN
375.0000 mg/m2 | Freq: Once | INTRAVENOUS | Status: AC
  Administered 2016-09-11: 600 mg via INTRAVENOUS
  Filled 2016-09-11: qty 50

## 2016-09-11 MED ORDER — ACETAMINOPHEN 160 MG/5ML PO SOLN
650.0000 mg | Freq: Once | ORAL | Status: AC
  Administered 2016-09-11: 650 mg via ORAL
  Filled 2016-09-11: qty 20.3

## 2016-09-11 MED ORDER — DIPHENHYDRAMINE HCL 25 MG PO CAPS
50.0000 mg | ORAL_CAPSULE | Freq: Once | ORAL | Status: AC
  Administered 2016-09-11: 50 mg via ORAL

## 2016-09-11 MED ORDER — DIPHENHYDRAMINE HCL 25 MG PO CAPS
ORAL_CAPSULE | ORAL | Status: AC
  Filled 2016-09-11: qty 2

## 2016-09-11 MED ORDER — ACETAMINOPHEN 325 MG PO TABS
ORAL_TABLET | ORAL | Status: AC
  Filled 2016-09-11: qty 2

## 2016-09-11 MED ORDER — ACETAMINOPHEN 325 MG PO TABS: 650.0000 mg | ORAL_TABLET | Freq: Once | ORAL | Status: DC

## 2016-09-11 NOTE — Assessment & Plan Note (Signed)
Her recent surgical specimen showed evidence of lymphoma involvement in the GI tract. We reviewed the current guidelines. She will be completing her last treatment today I will wait another 8 weeks and plan to repeat PET CT scan in 2 months to assess response to treatment

## 2016-09-11 NOTE — Progress Notes (Signed)
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[place])   05/18/2015 Imaging    Ct abdomen and pelvis showed reactive lymphadenopathy and stool filled colon      02/13/2016 Imaging    Ct chest showed pneumonia      05/08/2016 Imaging    screening mammogram revealed right axillary lymphadenopathy      05/12/2016 Procedure    She underwent core needle biopsy of axillary LN      05/12/2016 Pathology Results    [phone] Biopsy showed low grade non-Hodgkin B cell lymphoma, likely marginal zone lymphoma      05/15/2016 Pathology Results    Peripheral blood positive for deletion 13q      05/25/2016 PET scan    Mildly hypermetabolic mediastinal, left axillary and right inguinal lymph nodes. 2. Mild hypermetabolism in the left thyroid, nonspecific      06/27/2016 Imaging    Ct abdomen showed sigmoid volvulus resulting in large bowel obstruction. Splenomegaly, in keeping with patient's history of lymphoma.       06/30/2016 Pathology Results    Accession: [id number] pathology specimen showed low grade lymphoma      06/30/2016 Surgery    She had LAPAROSCOPIC SIGMOID COLECTOMY,  Rigid sigmoidoscopy, with Primary repair recurrent incisional hernia          INTERVAL HISTORY: Please see below for problem oriented charting. She is seen in the infusion room for last dose of treatment today She tolerated treatment well. She denies recent infection. No new lymphadenopathy. She denies recent problems with bleeding hemorrhoids  REVIEW OF SYSTEMS:   Constitutional: Denies fevers, chills or abnormal weight loss Eyes:  Denies blurriness of vision Ears, nose, mouth, throat, and face: Denies mucositis or sore throat Respiratory: Denies cough, dyspnea or wheezes Cardiovascular: Denies palpitation, chest discomfort or lower extremity swelling Gastrointestinal:  Denies nausea, heartburn or change in bowel habits Skin: Denies abnormal skin rashes Lymphatics: Denies new lymphadenopathy or easy bruising Neurological:Denies numbness, tingling or new weaknesses Behavioral/Psych: Mood is stable, no new changes  All other systems were reviewed with the patient and are negative.  I have reviewed the past medical history, past surgical history, social history and family history with the patient and they are unchanged from previous note.  ALLERGIES:  is allergic to morphine and related.  MEDICATIONS:  Current Outpatient Prescriptions  Medication Sig Dispense Refill  . allopurinol (ZYLOPRIM) 100 MG tablet Take 1 tablet (100 mg total) by mouth daily. 30 tablet 0  . aspirin EC 81 MG tablet Take 1 tablet (81 mg total) by mouth daily. 30 tablet 0  . feeding supplement, ENSURE ENLIVE, (ENSURE ENLIVE) LIQD Take 237 mLs by mouth 2 (two) times daily between meals. 237 mL 12  . furosemide (LASIX) 20 MG tablet Take 20 mg by mouth daily as needed for fluid.    . hydrALAZINE (APRESOLINE) 25 MG tablet Take 1 tablet (25 mg total) by mouth every 8 (eight) hours. 90 tablet 0  . Loratadine (CLARITIN PO) Take 10 mg by mouth.    . methocarbamol (ROBAXIN) 500 MG tablet Take 1 tablet (500 mg total) by mouth every 6 (six) hours as needed for muscle spasms. 30 tablet 0  .  pantoprazole (PROTONIX) 40 MG tablet Take 1 tablet (40 mg total) by mouth daily. 30 tablet 0  . polyethylene glycol (MIRALAX / GLYCOLAX) packet Take 17 g by mouth 2 (two) times daily. 14 each 0  . prednisoLONE acetate (PRED FORTE) 1 % ophthalmic suspension Place 1-2 drops into both eyes See admin instructions. 2 drops in right eye and 1 drop in the left eye twice daily    .  prochlorperazine (COMPAZINE) 10 MG tablet Take 1 tablet (10 mg total) by mouth every 6 (six) hours as needed for nausea or vomiting. 30 tablet 1  . sertraline (ZOLOFT) 25 MG tablet Take 1 tablet (25 mg total) by mouth daily. Reported on 05/19/2016 30 tablet 0  . traMADol (ULTRAM) 50 MG tablet Take 1 tablet (50 mg total) by mouth every 6 (six) hours as needed for moderate pain or severe pain. 20 tablet 0   No current facility-administered medications for this visit.     PHYSICAL EXAMINATION: ECOG PERFORMANCE STATUS: 2 - Symptomatic, <50% confined to bed  Vitals:   09/11/16 0938  BP: (!) 170/78  Pulse: 82  Resp: 18  Temp: 98.9 F (37.2 C)   There were no vitals filed for this visit.  GENERAL:alert, no distress and comfortable. She looks thin and cachectic SKIN: skin color, texture, turgor are normal, no rashes or significant lesions EYES: normal, Conjunctiva are pink and non-injected, sclera clear Musculoskeletal:no cyanosis of digits and no clubbing  NEURO: alert & oriented x 3 with fluent speech, no focal motor/sensory deficits  LABORATORY DATA:  I have reviewed the data as listed    Component Value Date/Time   NA 140 08/14/2016 0926   K 4.7 08/14/2016 0926   CL 111 07/02/2016 0401   CO2 24 08/14/2016 0926   GLUCOSE 86 08/14/2016 0926   BUN 19.2 08/14/2016 0926   CREATININE 1.0 08/14/2016 0926   CALCIUM 10.1 08/14/2016 0926   PROT 8.2 08/14/2016 0926   ALBUMIN 3.7 08/14/2016 0926   AST 17 08/14/2016 0926   ALT 9 08/14/2016 0926   ALKPHOS 80 08/14/2016 0926   BILITOT 0.40 08/14/2016 0926   GFRNONAA 59 (L) 07/02/2016 0401   GFRNONAA >60 07/02/2016 0401   GFRAA >60 07/02/2016 0401   GFRAA >60 07/02/2016 0401    No results found for: SPEP, UPEP  Lab Results  Component Value Date   WBC 9.2 08/14/2016   NEUTROABS 2.1 08/14/2016   HGB 9.6 (L) 08/14/2016   HCT 30.0 (L) 08/14/2016   MCV 83.8 08/14/2016   PLT 132 (L) 08/14/2016      Chemistry      Component Value  Date/Time   NA 140 08/14/2016 0926   K 4.7 08/14/2016 0926   CL 111 07/02/2016 0401   CO2 24 08/14/2016 0926   BUN 19.2 08/14/2016 0926   CREATININE 1.0 08/14/2016 0926      Component Value Date/Time   CALCIUM 10.1 08/14/2016 0926   ALKPHOS 80 08/14/2016 0926   AST 17 08/14/2016 0926   ALT 9 08/14/2016 0926   BILITOT 0.40 08/14/2016 0926      ASSESSMENT & PLAN:  Marginal zone lymphoma of axilla (HCC) Her recent surgical specimen showed evidence of lymphoma involvement in the GI tract. We reviewed the current guidelines. She will be completing her last treatment today I will wait another 8 weeks and plan to repeat PET CT scan in 2 months to assess response to treatment  Essential hypertension she will continue current medical management. I recommend  close follow-up with primary care doctor for medication adjustment.   Constipation The patient has history of chronic constipation. I recommend increasing dietary fruits & vegetables and to take regular laxative to avoid getting constipated. The patient is drinking lots of fluid She denies recent hemorrhoidal bleeding   Orders Placed This Encounter  Procedures  . NM PET Image Restag (PS) Skull Base To Thigh    Standing Status:   Future    Standing Expiration Date:   10/16/2017    Order Specific Question:   Reason for exam:    Answer:   staging lymphoma, assess response to Rx    Order Specific Question:   Preferred imaging location?    Answer:   [facility]  . Comprehensive metabolic panel    Standing Status:   Future    Standing Expiration Date:   10/16/2017  . CBC with Differential/Platelet    Standing Status:   Future    Standing Expiration Date:   10/16/2017  . Lactate dehydrogenase    Standing Status:   Future    Standing Expiration Date:   10/16/2017   All questions were answered. The patient knows to call the clinic with any problems, questions or concerns. No barriers to learning was detected. I spent  15 minutes counseling the patient face to face. The total time spent in the appointment was 20 minutes and more than 50% was on counseling and review of test results     Heath Lark, MD 09/11/2016 10:46 AM

## 2016-09-11 NOTE — Patient Instructions (Signed)
[facility] Discharge Instructions for Patients Receiving Chemotherapy  Today you received the following chemotherapy agents: Rituxan   To help prevent nausea and vomiting after your treatment, we encourage you to take your nausea medication as directed.    If you develop nausea and vomiting that is not controlled by your nausea medication, call the clinic.   BELOW ARE SYMPTOMS THAT SHOULD BE REPORTED IMMEDIATELY:  *FEVER GREATER THAN 100.5 F  *CHILLS WITH OR WITHOUT FEVER  NAUSEA AND VOMITING THAT IS NOT CONTROLLED WITH YOUR NAUSEA MEDICATION  *UNUSUAL SHORTNESS OF BREATH  *UNUSUAL BRUISING OR BLEEDING  TENDERNESS IN MOUTH AND THROAT WITH OR WITHOUT PRESENCE OF ULCERS  *URINARY PROBLEMS  *BOWEL PROBLEMS  UNUSUAL RASH Items with * indicate a potential emergency and should be followed up as soon as possible.  Feel free to call the clinic you have any questions or concerns. The clinic phone number is [phone].  Please show the CHEMO ALERT CARD at check-in to the Emergency Department and triage nurse.   

## 2016-09-11 NOTE — Progress Notes (Signed)
NO labs needed today per MD Alvy Bimler.

## 2016-09-11 NOTE — Telephone Encounter (Signed)
Appointments scheduled per 11/10 LOS. Patient given AVS report and calendars with future scheduled appointments. °

## 2016-09-11 NOTE — Assessment & Plan Note (Signed)
she will continue current medical management. I recommend close follow-up with primary care doctor for medication adjustment.  

## 2016-09-11 NOTE — Assessment & Plan Note (Signed)
The patient has history of chronic constipation. I recommend increasing dietary fruits & vegetables and to take regular laxative to avoid getting constipated. The patient is drinking lots of fluid She denies recent hemorrhoidal bleeding

## 2016-09-17 ENCOUNTER — Other Ambulatory Visit: Payer: Self-pay | Admitting: Hematology and Oncology

## 2016-09-19 ENCOUNTER — Emergency Department (HOSPITAL_COMMUNITY)
Admission: EM | Admit: 2016-09-19 | Discharge: 2016-09-19 | Disposition: A | Discharge status: Home / Self Care | Payer: Medicare Other | Attending: Physician Assistant | Admitting: Physician Assistant

## 2016-09-19 ENCOUNTER — Encounter (HOSPITAL_COMMUNITY): Payer: Self-pay

## 2016-09-19 DIAGNOSIS — Z87891 Personal history of nicotine dependence: Secondary | ICD-10-CM | POA: Diagnosis not present

## 2016-09-19 DIAGNOSIS — Y929 Unspecified place or not applicable: Secondary | ICD-10-CM | POA: Insufficient documentation

## 2016-09-19 DIAGNOSIS — Z7982 Long term (current) use of aspirin: Secondary | ICD-10-CM | POA: Diagnosis not present

## 2016-09-19 DIAGNOSIS — X19XXXA Contact with other heat and hot substances, initial encounter: Secondary | ICD-10-CM | POA: Diagnosis not present

## 2016-09-19 DIAGNOSIS — Y93G9 Activity, other involving cooking and grilling: Secondary | ICD-10-CM | POA: Diagnosis not present

## 2016-09-19 DIAGNOSIS — Y999 Unspecified external cause status: Secondary | ICD-10-CM | POA: Diagnosis not present

## 2016-09-19 DIAGNOSIS — T24011A Burn of unspecified degree of right thigh, initial encounter: Secondary | ICD-10-CM | POA: Diagnosis present

## 2016-09-19 DIAGNOSIS — Z8673 Personal history of transient ischemic attack (TIA), and cerebral infarction without residual deficits: Secondary | ICD-10-CM | POA: Insufficient documentation

## 2016-09-19 DIAGNOSIS — E119 Type 2 diabetes mellitus without complications: Secondary | ICD-10-CM | POA: Insufficient documentation

## 2016-09-19 DIAGNOSIS — T24211A Burn of second degree of right thigh, initial encounter: Secondary | ICD-10-CM | POA: Insufficient documentation

## 2016-09-19 DIAGNOSIS — T300 Burn of unspecified body region, unspecified degree: Secondary | ICD-10-CM

## 2016-09-19 DIAGNOSIS — I10 Essential (primary) hypertension: Secondary | ICD-10-CM | POA: Diagnosis not present

## 2016-09-19 MED ORDER — BACITRACIN ZINC 500 UNIT/GM EX OINT
1.0000 "application " | TOPICAL_OINTMENT | Freq: Two times a day (BID) | CUTANEOUS | Status: DC
  Administered 2016-09-19: 1 via TOPICAL

## 2016-09-19 MED ORDER — BACITRACIN ZINC 500 UNIT/GM EX OINT: 1.0000 "application " | TOPICAL_OINTMENT | Freq: Two times a day (BID) | CUTANEOUS | 0 refills | Status: DC

## 2016-09-19 NOTE — ED Triage Notes (Signed)
Pt has some blistering noted to outer R thigh area circumfrential in appearance

## 2016-09-19 NOTE — ED Provider Notes (Signed)
[place] DEPT Provider Note   CSN: [id number] Arrival date & time: 09/19/16  1929     History   Chief Complaint Chief Complaint  Patient presents with  . Burn    pt while cooking spilled grease onto R thigh outer anterior     HPI Erika Day is a 82 y.o. female.  Patient presents to the emergency department with chief complaint of grease burn on right thigh. Patient states that she was frying dinner this evening, and spilled grease on her right leg. Complains of mild to moderate pain. He denies any other associated symptoms. She has not taken anything for her symptoms. Last tetanus shot was 9 months ago. The symptoms are worsened with palpation and with clothing rubbing on the burn.   The history is provided by the patient. No language interpreter was used.    Past Medical History:  Diagnosis Date  . Acid reflux disease   . Cancer [facility])    Looking for documentation of this  . CLL (chronic lymphocytic leukemia) ([place]) 05/15/2016  . DDD (degenerative disc disease), lumbar   . Degenerative joint disease   . Diabetes mellitus without complication ([place])   . Dyslipidemia   . Hypertension   . 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 Panic attacks   . Pneumonia 02/2016  . 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 Stroke [facility]) age 53 yo   poorly controlled DM and Hypertension    Patient Active Problem List   Diagnosis Date Noted  . Bleeding external hemorrhoids 08/20/2016  . Anemia due to chronic illness 08/14/2016  . Recurrent ventral incisional hernia s/p primary repair 06/30/2016 07/01/2016  . Pressure ulcer 05/20/2016  . Lower extremity edema 05/20/2016  . Protein-calorie malnutrition, severe ([place]) 05/20/2016  . Volvulus of sigmoid colon ([place])   . Sigmoid volvulus s/p lap sigmoid colectomy 06/30/2016 05/19/2016  . 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 Constipation 05/15/2016  . Other fatigue 05/15/2016  . CAP (community acquired pneumonia) 02/13/2016  . Anemia, unspecified 02/13/2016  . Thrombocytopenia ([place]) 02/13/2016  . Hyponatremia 02/13/2016  . Kyphosis 02/01/2016  . Pressure ulcer of coccygeal region 01/31/2016  . Mobility impaired 01/31/2016  . Osteoarthritis 01/31/2016  . Essential hypertension 09/06/2011  . Ovarian torsion 08/25/2011    Past Surgical History:  Procedure Laterality Date  . ABDOMINAL HYSTERECTOMY  ? 08/25/2011 ?   Has BSO for left benign ovarian tumor with torsion and broad ligament benign tumor, but does not appear had hysterectomy--[facility], NOT ovarian cancer  . CHOLECYSTECTOMY    . FLEXIBLE SIGMOIDOSCOPY N/A 05/19/2016   Procedure: FLEXIBLE SIGMOIDOSCOPY;  Surgeon: [name], MD;  Location: [facility] ENDOSCOPY;  Service: Endoscopy;  Laterality: N/A;  . FLEXIBLE SIGMOIDOSCOPY N/A 06/28/2016   Procedure: FLEXIBLE SIGMOIDOSCOPY;  Surgeon: Ladene Artist, MD;  Location: [facility] ENDOSCOPY;  Service: Endoscopy;  Laterality: N/A;  . LAPAROSCOPIC SIGMOID COLECTOMY N/A 06/30/2016   Procedure: LAPAROSCOPIC SIGMOID COLECTOMY,rigid sigmoidoscopy, repair recurrent incisional hernia;  Surgeon: Michael Boston, MD;  Location: [facility] ORS;  Service: General;  Laterality: N/A;  . LAPAROTOMY N/A 04/29/2014   Procedure: EXPLORATORY LAPAROTOMY;  Surgeon: Imogene Burn. Georgette Dover, MD;  Location: [place];  Service: General;  Laterality: N/A;  . LYSIS OF ADHESION N/A 04/29/2014   Procedure: EXTENSIVE LYSIS OF ADHESIONS;  Surgeon: Imogene Burn. Georgette Dover, MD;  Location: [place];  Service: General;  Laterality:  N/A;  . VENTRAL HERNIA REPAIR N/A 04/29/2014   Procedure: PRIMARY REPAIR OF VENTRAL HERNIA;  Surgeon: Imogene Burn. Georgette Dover, MD;  Location: [place];  Service: General;  Laterality: N/A;    OB History    No data available       Home Medications    Prior to Admission medications   Medication Sig Start Date End Date Taking? Authorizing Provider  allopurinol  (ZYLOPRIM) 100 MG tablet Take 1 tablet (100 mg total) by mouth daily. 08/14/16   Heath Lark, MD  aspirin EC 81 MG tablet Take 1 tablet (81 mg total) by mouth daily. 06/05/16   Heath Lark, MD  feeding supplement, ENSURE ENLIVE, (ENSURE ENLIVE) LIQD Take 237 mLs by mouth 2 (two) times daily between meals. 07/03/16   Lavina Hamman, MD  furosemide (LASIX) 20 MG tablet Take 20 mg by mouth daily as needed for fluid.    Historical Provider, MD  hydrALAZINE (APRESOLINE) 25 MG tablet Take 1 tablet (25 mg total) by mouth every 8 (eight) hours. 07/03/16   Lavina Hamman, MD  Loratadine (CLARITIN PO) Take 10 mg by mouth.    Historical Provider, MD  methocarbamol (ROBAXIN) 500 MG tablet Take 1 tablet (500 mg total) by mouth every 6 (six) hours as needed for muscle spasms. 07/03/16   Lavina Hamman, MD  pantoprazole (PROTONIX) 40 MG tablet Take 1 tablet (40 mg total) by mouth daily. 06/05/16   Heath Lark, MD  polyethylene glycol (MIRALAX / GLYCOLAX) packet Take 17 g by mouth 2 (two) times daily. 05/22/16   Charlynne Cousins, MD  prednisoLONE acetate (PRED FORTE) 1 % ophthalmic suspension Place 1-2 drops into both eyes See admin instructions. 2 drops in right eye and 1 drop in the left eye twice daily    Historical Provider, MD  prochlorperazine (COMPAZINE) 10 MG tablet Take 1 tablet (10 mg total) by mouth every 6 (six) hours as needed for nausea or vomiting. 08/28/16   Heath Lark, MD  sertraline (ZOLOFT) 25 MG tablet TAKE 1 TABLET BY MOUTH DAILY 09/17/16   Heath Lark, MD  traMADol (ULTRAM) 50 MG tablet Take 1 tablet (50 mg total) by mouth every 6 (six) hours as needed for moderate pain or severe pain. 07/03/16   Lavina Hamman, MD    Family History Family History  Problem Relation Age of Onset  . Hypertension Mother   . Stroke Mother   . Arthritis Mother   . Alcohol abuse Father     Social History Social History  Substance Use Topics  . Smoking status: Former Smoker    Types: Cigarettes    Quit date: 02/18/1990    . Smokeless tobacco: Former Systems developer    Types: Snuff    Quit date: 02/18/1990  . Alcohol use No     Comment: Only drank alcohol between ages of 52 and 13 yo.  Her father gave it to her     Allergies   Morphine and related   Review of Systems Review of Systems  Skin:       Burn  All other systems reviewed and are negative.    Physical Exam Updated Vital Signs BP 188/80 (BP Location: Right Arm)   Pulse 81   Temp 98 F (36.7 C) (Oral)   Resp 18   Ht 5\' 5"  (1.651 m)   Wt 62 kg   SpO2 100%   BMI 22.73 kg/m   Physical Exam  Constitutional: She is oriented to person, place, and  time. She appears well-developed and well-nourished.  HENT:  Head: Normocephalic and atraumatic.  Eyes: Conjunctivae and EOM are normal.  Neck: Normal range of motion.  Cardiovascular: Normal rate.   Pulmonary/Chest: Effort normal.  Abdominal: She exhibits no distension.  Musculoskeletal: Normal range of motion.  Neurological: She is alert and oriented to person, place, and time.  Skin: Skin is dry.  Superficial burn of right lateral thigh, with 3-4 small areas of partial-thickness burns, with intact vesicles, approximating less than 1% of total body surface area  Psychiatric: She has a normal mood and affect. Her behavior is normal. Judgment and thought content normal.  Nursing note and vitals reviewed.    ED Treatments / Results  Labs (all labs ordered are listed, but only abnormal results are displayed) Labs Reviewed - No data to display  EKG  EKG Interpretation None       Radiology No results found.  Procedures Procedures (including critical care time)  Medications Ordered in ED Medications  bacitracin ointment 1 application (not administered)     Initial Impression / Assessment and Plan / ED Course  I have reviewed the triage vital signs and the nursing notes.  Pertinent labs & imaging results that were available during my care of the patient were reviewed by me and  considered in my medical decision making (see chart for details).  Clinical Course     Patient with grease burn to right lateral thigh. Largely superficial burns, but there are a few small vesicles. Will treat with bacitracin ointment and nonstick dressing covering gauze. Burn care instructions given to the patient. Tetanus shot is up-to-date. Will discharge with some pain medicine. Patient understands agrees the plan. She is stable and ready for discharge.  Final Clinical Impressions(s) / ED Diagnoses   Final diagnoses:  Superficial burn    New Prescriptions New Prescriptions   BACITRACIN OINTMENT    Apply 1 application topically 2 (two) times daily.     Montine Circle, PA-C 09/19/16 2111    [place], MD 09/20/16 2218

## 2016-09-21 ENCOUNTER — Other Ambulatory Visit: Payer: Self-pay | Admitting: *Deleted

## 2016-09-21 MED ORDER — ALLOPURINOL 100 MG PO TABS: 100.0000 mg | ORAL_TABLET | Freq: Every day | ORAL | 0 refills | Status: DC

## 2016-09-28 ENCOUNTER — Telehealth: Payer: Self-pay

## 2016-09-28 NOTE — Telephone Encounter (Signed)
Daughter called to find out next appt time and date for her mother. Given.

## 2016-10-13 ENCOUNTER — Ambulatory Visit (HOSPITAL_BASED_OUTPATIENT_CLINIC_OR_DEPARTMENT_OTHER): Payer: Medicare Other | Admitting: Nurse Practitioner

## 2016-10-13 ENCOUNTER — Ambulatory Visit (HOSPITAL_BASED_OUTPATIENT_CLINIC_OR_DEPARTMENT_OTHER): Payer: Medicare Other

## 2016-10-13 ENCOUNTER — Telehealth: Payer: Self-pay

## 2016-10-13 VITALS — BP 164/86 | HR 60 | Temp 98.8°F | Resp 18 | Ht 65.0 in | Wt 136.5 lb

## 2016-10-13 DIAGNOSIS — C8304 Small cell B-cell lymphoma, lymph nodes of axilla and upper limb: Secondary | ICD-10-CM

## 2016-10-13 DIAGNOSIS — J0100 Acute maxillary sinusitis, unspecified: Secondary | ICD-10-CM

## 2016-10-13 DIAGNOSIS — C8584 Other specified types of non-Hodgkin lymphoma, lymph nodes of axilla and upper limb: Secondary | ICD-10-CM | POA: Diagnosis not present

## 2016-10-13 LAB — COMPREHENSIVE METABOLIC PANEL
ALK PHOS: 75 U/L (ref 40–150)
ALT: 8 U/L (ref 0–55)
AST: 13 U/L (ref 5–34)
Albumin: 3.4 g/dL — ABNORMAL LOW (ref 3.5–5.0)
Anion Gap: 8 mEq/L (ref 3–11)
BILIRUBIN TOTAL: 0.35 mg/dL (ref 0.20–1.20)
BUN: 13.6 mg/dL (ref 7.0–26.0)
CO2: 25 mEq/L (ref 22–29)
CREATININE: 0.9 mg/dL (ref 0.6–1.1)
Calcium: 10 mg/dL (ref 8.4–10.4)
Chloride: 104 mEq/L (ref 98–109)
EGFR: 66 mL/min/{1.73_m2} — ABNORMAL LOW (ref >90)
Glucose: 98 mg/dl (ref 70–140)
Potassium: 4.3 mEq/L (ref 3.5–5.1)
SODIUM: 137 meq/L (ref 136–145)
Total Protein: 7.5 g/dL (ref 6.4–8.3)

## 2016-10-13 LAB — CBC WITH DIFFERENTIAL/PLATELET
BASO%: 1.1 % (ref 0.0–2.0)
Basophils Absolute: 0 10e3/uL (ref 0.0–0.1)
EOS%: 2.2 % (ref 0.0–7.0)
Eosinophils Absolute: 0.1 10e3/uL (ref 0.0–0.5)
HCT: 30.8 % — ABNORMAL LOW (ref 34.8–46.6)
HGB: 9.9 g/dL — ABNORMAL LOW (ref 11.6–15.9)
LYMPH%: 29.9 % (ref 14.0–49.7)
MCH: 26.2 pg (ref 25.1–34.0)
MCHC: 32.2 g/dL (ref 31.5–36.0)
MCV: 81.5 fL (ref 79.5–101.0)
MONO#: 0.3 10e3/uL (ref 0.1–0.9)
MONO%: 8.9 % (ref 0.0–14.0)
NEUT#: 1.9 10e3/uL (ref 1.5–6.5)
NEUT%: 57.9 % (ref 38.4–76.8)
Platelets: 144 10e3/uL — ABNORMAL LOW (ref 145–400)
RBC: 3.77 10e6/uL (ref 3.70–5.45)
RDW: 16.7 % — ABNORMAL HIGH (ref 11.2–14.5)
WBC: 3.2 10e3/uL — ABNORMAL LOW (ref 3.9–10.3)
lymph#: 1 10e3/uL (ref 0.9–3.3)

## 2016-10-13 MED ORDER — AMOXICILLIN-POT CLAVULANATE 875-125 MG PO TABS: 1.0000 | ORAL_TABLET | Freq: Two times a day (BID) | ORAL | 0 refills | Status: DC

## 2016-10-13 NOTE — Telephone Encounter (Signed)
S/w pt. Pt would be unable to see PCP until tomorrow. Instructed her to come to [facility] no later than 3 pm. Her husband is currently grocery shopping and she will try to get a ride.  CBC and Cmet ordered. inbasket to scheduler.

## 2016-10-13 NOTE — Telephone Encounter (Signed)
Pt has nasal congestion. Started yesterday or day before. It is yellow and bloody, it will show on the outside of the tissue. Has post nasal drip making her cough. 98.6 temp when on phone.  Last rituxan 09/11/16. [phone] is pt's phone number. Care giver feels it is a sinus infection coming on.

## 2016-10-13 NOTE — Addendum Note (Signed)
Addended by: Janace Hoard on: 10/13/2016 12:19 PM   Modules accepted: Orders

## 2016-10-13 NOTE — Patient Instructions (Addendum)
Sinus Headache A sinus headache happens when your sinuses become clogged or swollen. You may feel pain or pressure in your face, forehead, ears, or upper teeth. Sinus headaches can be mild or severe. Follow these instructions at home:  Take medicines only as told by your doctor.  If you were given an antibiotic medicine, finish all of it even if you start to feel better.  Use a nose spray if you feel stuffed up (congested).  If told, apply a warm, moist washcloth to your face to help lessen pain. Contact a doctor if:  You get headaches more than one time each week.  Light or sound bothers you.  You have a fever.  You feel sick to your stomach (nauseous) or you throw up (vomit).  Your headaches do not get better with treatment. Get help right away if:  You have trouble seeing.  You suddenly have very bad pain in your face or head.  You start to twitch or shake (seizure).  You are confused.  You have a stiff neck. This information is not intended to replace advice given to you by your health care provider. Make sure you discuss any questions you have with your health care provider. Document Released: 02/18/2011 Document Revised: 06/14/2016 Document Reviewed: 10/15/2014 Elsevier Interactive Patient Education  2017 [employer] Inc[employer]     Sinusitis, Adult Sinusitis is soreness and inflammation of your sinuses. Sinuses are hollow spaces in the bones around your face. They are located:  Around your eyes.  In the middle of your forehead.  Behind your nose.  In your cheekbones. Your sinuses and nasal passages are lined with a stringy fluid (mucus). Mucus normally drains out of your sinuses. When your nasal tissues get inflamed or swollen, the mucus can get trapped or blocked so air cannot flow through your sinuses. This lets bacteria, viruses, and funguses grow, and that leads to infection. Follow these instructions at home: Medicines  Take, use, or apply over-the-counter and  prescription medicines only as told by your doctor. These may include nasal sprays.  If you were prescribed an antibiotic medicine, take it as told by your doctor. Do not stop taking the antibiotic even if you start to feel better. Hydrate and Humidify  Drink enough water to keep your pee (urine) clear or pale yellow.  Use a cool mist humidifier to keep the humidity level in your home above 50%.  Breathe in steam for 10-15 minutes, 3-4 times a day or as told by your doctor. You can do this in the bathroom while a hot shower is running.  Try not to spend time in cool or dry air. Rest  Rest as much as possible.  Sleep with your head raised (elevated).  Make sure to get enough sleep each night. General instructions  Put a warm, moist washcloth on your face 3-4 times a day or as told by your doctor. This will help with discomfort.  Wash your hands often with soap and water. If there is no soap and water, use hand sanitizer.  Do not smoke. Avoid being around people who are smoking (secondhand smoke).  Keep all follow-up visits as told by your doctor. This is important. Contact a doctor if:  You have a fever.  Your symptoms get worse.  Your symptoms do not get better within 10 days. Get help right away if:  You have a very bad headache.  You cannot stop throwing up (vomiting).  You have pain or swelling around your face or  eyes.  You have trouble seeing.  You feel confused.  Your neck is stiff.  You have trouble breathing. This information is not intended to replace advice given to you by your health care provider. Make sure you discuss any questions you have with your health care provider. Document Released: 04/06/2008 Document Revised: 06/14/2016 Document Reviewed: 08/14/2015 Elsevier Interactive Patient Education  2017 [employer].

## 2016-10-13 NOTE — Telephone Encounter (Signed)
I am overbooked Bloody discharge needs evaluation Recommend PCP or symptom management evaluation

## 2016-10-14 ENCOUNTER — Encounter: Payer: Self-pay | Admitting: Nurse Practitioner

## 2016-10-14 DIAGNOSIS — J329 Chronic sinusitis, unspecified: Secondary | ICD-10-CM | POA: Insufficient documentation

## 2016-10-14 NOTE — Assessment & Plan Note (Signed)
Patient is status post Rituxan infusions.  She is currently undergoing observation only; and is awaiting her restaging scan.  She is scheduled to return for labs only on 11/12/2016.  She is scheduled for follow-up visit on 11/14/2015.

## 2016-10-14 NOTE — Progress Notes (Addendum)
SYMPTOM MANAGEMENT CLINIC    Chief Complaint: Sinusitis  HPI:  Erika Day 82 y.o. female diagnosed with lymphoma.  Patient is status post Rituxan infusions.  Currently undergoing observation only.     Marginal zone lymphoma of axilla (HCC)   05/18/2015 Imaging    Ct abdomen and pelvis showed reactive lymphadenopathy and stool filled colon      02/13/2016 Imaging    Ct chest showed pneumonia      05/08/2016 Imaging    screening mammogram revealed right axillary lymphadenopathy      05/12/2016 Procedure    She underwent core needle biopsy of axillary LN      05/12/2016 Pathology Results    [id number] Biopsy showed low grade non-Hodgkin B cell lymphoma, likely marginal zone lymphoma      05/15/2016 Pathology Results    Peripheral blood positive for deletion 13q      05/25/2016 PET scan    Mildly hypermetabolic mediastinal, left axillary and right inguinal lymph nodes. 2. Mild hypermetabolism in the left thyroid, nonspecific      06/27/2016 Imaging    Ct abdomen showed sigmoid volvulus resulting in large bowel obstruction. Splenomegaly, in keeping with patient's history of lymphoma.       06/30/2016 Pathology Results    Accession: [id number] pathology specimen showed low grade lymphoma      06/30/2016 Surgery    She had LAPAROSCOPIC SIGMOID COLECTOMY,  Rigid sigmoidoscopy, with Primary repair recurrent incisional hernia          Review of Systems  Constitutional: Positive for malaise/fatigue.  HENT: Positive for congestion and nosebleeds.   All other systems reviewed and are negative.   Past Medical History:  Diagnosis Date  . Acid reflux disease   . Cancer [facility])    Looking for documentation of this  . CLL (chronic lymphocytic leukemia) ([place]) 05/15/2016  . DDD (degenerative disc disease), lumbar   . Degenerative joint disease   . Diabetes mellitus without complication ([place])   . Dyslipidemia   . Hypertension   . Marginal zone lymphoma of axilla ([place])  05/15/2016  . Panic attacks   . Pneumonia 02/2016  . 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 Stroke [facility]) age 32 yo   poorly controlled DM and Hypertension    Past Surgical History:  Procedure Laterality Date  . ABDOMINAL HYSTERECTOMY  ? 08/25/2011 ?   Has BSO for left benign ovarian tumor with torsion and broad ligament benign tumor, but does not appear had hysterectomy--[facility], NOT ovarian cancer  . CHOLECYSTECTOMY    . FLEXIBLE SIGMOIDOSCOPY N/A 05/19/2016   Procedure: FLEXIBLE SIGMOIDOSCOPY;  Surgeon: [name], MD;  Location: [facility];  Service: Endoscopy;  Laterality: N/A;  . FLEXIBLE SIGMOIDOSCOPY N/A 06/28/2016   Procedure: FLEXIBLE SIGMOIDOSCOPY;  Surgeon: Ladene Artist, MD;  Location: [facility];  Service: Endoscopy;  Laterality: N/A;  . LAPAROSCOPIC SIGMOID COLECTOMY N/A 06/30/2016   Procedure: LAPAROSCOPIC SIGMOID COLECTOMY,rigid sigmoidoscopy, repair recurrent incisional hernia;  Surgeon: Michael Boston, MD;  Location: [facility];  Service: General;  Laterality: N/A;  . LAPAROTOMY N/A 04/29/2014   Procedure: EXPLORATORY LAPAROTOMY;  Surgeon: Imogene Burn. Georgette Dover, MD;  Location: [place];  Service: General;  Laterality: N/A;  . LYSIS OF ADHESION N/A 04/29/2014   Procedure: EXTENSIVE LYSIS OF ADHESIONS;  Surgeon: Imogene Burn. Georgette Dover, MD;  Location: [place];  Service: General;  Laterality: N/A;  . VENTRAL HERNIA REPAIR N/A 04/29/2014   Procedure: PRIMARY REPAIR OF VENTRAL HERNIA;  Surgeon: Imogene Burn. 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    is allergic to morphine and related.    Medication List       Accurate as of 10/13/16 11:59 PM. Always use your most recent med list.          allopurinol 100 MG tablet Commonly known as:  ZYLOPRIM Take 1 tablet (100 mg total) by mouth daily.   amoxicillin-clavulanate 875-125 MG tablet Commonly known as:  AUGMENTIN Take 1 tablet by mouth 2 (two) times daily.   aspirin EC 81 MG tablet Take 1 tablet (81 mg total) by mouth daily.   bacitracin ointment Apply 1 application topically 2 (two) times daily.   CLARITIN PO Take 10 mg by mouth.   feeding supplement (ENSURE ENLIVE) Liqd Take 237 mLs by mouth 2 (two) times daily between meals.   furosemide 20 MG tablet Commonly known as:  LASIX Take 20 mg by mouth daily as needed for fluid.   hydrALAZINE 25 MG tablet Commonly known as:  APRESOLINE Take 1 tablet (25 mg total) by mouth every 8 (eight) hours.   methocarbamol 500 MG tablet Commonly known as:  ROBAXIN Take 1 tablet (500 mg total) by mouth every 6 (six) hours as needed for muscle spasms.   pantoprazole 40 MG tablet Commonly known as:  PROTONIX Take 1 tablet (40 mg total) by mouth daily.   polyethylene glycol packet Commonly known as:  MIRALAX / GLYCOLAX Take 17 g by mouth 2 (two) times daily.   prednisoLONE acetate 1 % ophthalmic suspension Commonly known as:  PRED FORTE Place 1-2 drops into both eyes See admin instructions. 2 drops in right eye and 1 drop in the left eye twice daily   prochlorperazine 10 MG tablet Commonly known as:  COMPAZINE Take 1 tablet (10 mg total) by mouth every 6 (six) hours as needed for nausea or vomiting.   sertraline 25 MG tablet Commonly known as:   ZOLOFT TAKE 1 TABLET BY MOUTH DAILY   traMADol 50 MG tablet Commonly known as:  ULTRAM Take 1 tablet (50 mg total) by mouth every 6 (six) hours as needed for moderate pain or severe pain.        PHYSICAL EXAMINATION  Oncology Vitals 10/13/2016 09/19/2016  Height 165 cm -  Weight 61.916 kg -  Weight (lbs) 136 lbs 8 oz -  BMI (kg/m2) 22.71 kg/m2 -  Temp 98.8 98.1  Pulse 60 80  Resp 18 18  SpO2 100 100  BSA (m2) 1.68 m2 -   BP Readings from Last 2 Encounters:  10/13/16 (!) 164/86  09/19/16 180/72    Physical Exam  Constitutional: She is oriented to person, place, and time and well-developed, well-nourished, and in no distress.  HENT:  Head: Normocephalic and atraumatic.  Mild nasal congestion; and some irritation to the right nare only.  Eyes: Conjunctivae and EOM are normal. Pupils are equal, round, and reactive to light. Right eye exhibits  no discharge. Left eye exhibits no discharge. No scleral icterus.  Neck: Normal range of motion. Neck supple. No JVD present. No tracheal deviation present. No thyromegaly present.  Cardiovascular: Normal rate, regular rhythm and intact distal pulses.   Murmur heard. Murmur as baseline  Pulmonary/Chest: Effort normal and breath sounds normal. No respiratory distress. She has no wheezes. She has no rales. She exhibits no tenderness.  Abdominal: Soft. Bowel sounds are normal. She exhibits no distension and no mass. There is no tenderness. There is no rebound and no guarding.  Musculoskeletal: Normal range of motion. She exhibits no edema or tenderness.  Lymphadenopathy:    She has no cervical adenopathy.  Neurological: She is alert and oriented to person, place, and time. Gait normal.  Skin: Skin is warm and dry. No rash noted. No erythema. No pallor.  Psychiatric: Affect normal.  Nursing note and vitals reviewed.   LABORATORY DATA:. Appointment on 10/13/2016  Component Date Value Ref Range Status  . WBC 10/13/2016 3.2* 3.9 - 10.3  10e3/uL Final  . NEUT# 10/13/2016 1.9  1.5 - 6.5 10e3/uL Final  . HGB 10/13/2016 9.9* 11.6 - 15.9 g/dL Final  . HCT 10/13/2016 30.8* 34.8 - 46.6 % Final  . Platelets 10/13/2016 144* 145 - 400 10e3/uL Final  . MCV 10/13/2016 81.5  79.5 - 101.0 fL Final  . MCH 10/13/2016 26.2  25.1 - 34.0 pg Final  . MCHC 10/13/2016 32.2  31.5 - 36.0 g/dL Final  . RBC 10/13/2016 3.77  3.70 - 5.45 10e6/uL Final  . RDW 10/13/2016 16.7* 11.2 - 14.5 % Final  . lymph# 10/13/2016 1.0  0.9 - 3.3 10e3/uL Final  . MONO# 10/13/2016 0.3  0.1 - 0.9 10e3/uL Final  . Eosinophils Absolute 10/13/2016 0.1  0.0 - 0.5 10e3/uL Final  . Basophils Absolute 10/13/2016 0.0  0.0 - 0.1 10e3/uL Final  . NEUT% 10/13/2016 57.9  38.4 - 76.8 % Final  . LYMPH% 10/13/2016 29.9  14.0 - 49.7 % Final  . MONO% 10/13/2016 8.9  0.0 - 14.0 % Final  . EOS% 10/13/2016 2.2  0.0 - 7.0 % Final  . BASO% 10/13/2016 1.1  0.0 - 2.0 % Final  . Sodium 10/13/2016 137  136 - 145 mEq/L Final  . Potassium 10/13/2016 4.3  3.5 - 5.1 mEq/L Final  . Chloride 10/13/2016 104  98 - 109 mEq/L Final  . CO2 10/13/2016 25  22 - 29 mEq/L Final  . Glucose 10/13/2016 98  70 - 140 mg/dl Final  . BUN 10/13/2016 13.6  7.0 - 26.0 mg/dL Final  . Creatinine 10/13/2016 0.9  0.6 - 1.1 mg/dL Final  . Total Bilirubin 10/13/2016 0.35  0.20 - 1.20 mg/dL Final  . Alkaline Phosphatase 10/13/2016 75  40 - 150 U/L Final  . AST 10/13/2016 13  5 - 34 U/L Final  . ALT 10/13/2016 8  0 - 55 U/L Final  . Total Protein 10/13/2016 7.5  6.4 - 8.3 g/dL Final  . Albumin 10/13/2016 3.4* 3.5 - 5.0 g/dL Final  . Calcium 10/13/2016 10.0  8.4 - 10.4 mg/dL Final  . Anion Gap 10/13/2016 8  3 - 11 mEq/L Final  . EGFR 10/13/2016 66* >90 ml/min/1.73 m2 Final    RADIOGRAPHIC STUDIES: No results found.  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  She is currently undergoing observation only; and is awaiting her restaging scan.   She is scheduled to return for  labs only on 11/12/2016.  She is scheduled for follow-up visit on 11/14/2015.  Sinusitis Patient states that she has been having sinus drainage, sinus congestion, and occasional dry cough, and some fullness in her head for the past 2 weeks.  She states that she also bruised her nose occasionally; and will have some bleeding from the right near only.  She denies any recent fevers or chills.  Exam today reveals some minor nasal congestion and some irritation to the inside of the right near only.  There is no active bleeding noted on exam.  Breath sounds are clear bilaterally; with no cough or wheeze noted.  Patient was afebrile as well.  There was no obvious facial tenderness with exam.  Labs obtained today were all within normal limits; with just a mild anemia noted.  Patient was afebrile today.  Will treat patient for some mild sinusitis symptoms with Augmentin antibiotics.  Patient states that she thinks that she has taken Augmentin in the past and was able to tolerate it fairly well.  Patient was advised to call/return or go directly to the emergency department for any worsening symptoms whatsoever.   Patient stated understanding of all instructions; and was in agreement with this plan of care. The patient knows to call the clinic with any problems, questions or concerns.   Total time spent with patient was 25 minutes;  with greater than 75 percent of that time spent in face to face counseling regarding patient's symptoms,  and coordination of care and follow up.  Disclaimer:This dictation was prepared with Dragon/digital dictation along with [employer]. Any transcriptional errors that result from this process are unintentional.  Drue Second, NP 10/14/2016

## 2016-10-14 NOTE — Assessment & Plan Note (Signed)
Patient states that she has been having sinus drainage, sinus congestion, and occasional dry cough, and some fullness in her head for the past 2 weeks.  She states that she also bruised her nose occasionally; and will have some bleeding from the right near only.  She denies any recent fevers or chills.  Exam today reveals some minor nasal congestion and some irritation to the inside of the right near only.  There is no active bleeding noted on exam.  Breath sounds are clear bilaterally; with no cough or wheeze noted.  Patient was afebrile as well.  There was no obvious facial tenderness with exam.  Labs obtained today were all within normal limits; with just a mild anemia noted.  Patient was afebrile today.  Will treat patient for some mild sinusitis symptoms with Augmentin antibiotics.  Patient states that she thinks that she has taken Augmentin in the past and was able to tolerate it fairly well.  Patient was advised to call/return or go directly to the emergency department for any worsening symptoms whatsoever.

## 2016-10-19 ENCOUNTER — Telehealth: Payer: Self-pay | Admitting: *Deleted

## 2016-10-19 NOTE — Telephone Encounter (Signed)
TCT patient to follow up on [facility] visit on 10/13/17.  No answer and no available vm

## 2016-11-04 ENCOUNTER — Encounter: Payer: Self-pay | Admitting: *Deleted

## 2016-11-04 ENCOUNTER — Other Ambulatory Visit: Payer: Self-pay | Admitting: *Deleted

## 2016-11-12 ENCOUNTER — Other Ambulatory Visit: Payer: Medicare Other

## 2016-11-13 ENCOUNTER — Ambulatory Visit (HOSPITAL_COMMUNITY)
Admission: RE | Admit: 2016-11-13 | Discharge: 2016-11-13 | Disposition: A | Discharge status: Home / Self Care | Payer: Medicare Other | Source: Ambulatory Visit | Attending: Hematology and Oncology | Admitting: Hematology and Oncology

## 2016-11-13 ENCOUNTER — Ambulatory Visit (HOSPITAL_BASED_OUTPATIENT_CLINIC_OR_DEPARTMENT_OTHER): Payer: Medicare Other | Admitting: Hematology and Oncology

## 2016-11-13 ENCOUNTER — Telehealth: Payer: Self-pay | Admitting: Hematology and Oncology

## 2016-11-13 ENCOUNTER — Other Ambulatory Visit (HOSPITAL_BASED_OUTPATIENT_CLINIC_OR_DEPARTMENT_OTHER): Payer: Medicare Other

## 2016-11-13 ENCOUNTER — Encounter: Payer: Self-pay | Admitting: Hematology and Oncology

## 2016-11-13 VITALS — BP 170/66 | HR 66 | Temp 98.5°F | Resp 16 | Ht 65.0 in

## 2016-11-13 DIAGNOSIS — I10 Essential (primary) hypertension: Secondary | ICD-10-CM

## 2016-11-13 DIAGNOSIS — D61818 Other pancytopenia: Secondary | ICD-10-CM

## 2016-11-13 DIAGNOSIS — C8584 Other specified types of non-Hodgkin lymphoma, lymph nodes of axilla and upper limb: Secondary | ICD-10-CM | POA: Diagnosis not present

## 2016-11-13 DIAGNOSIS — C8304 Small cell B-cell lymphoma, lymph nodes of axilla and upper limb: Secondary | ICD-10-CM

## 2016-11-13 DIAGNOSIS — C8590 Non-Hodgkin lymphoma, unspecified, unspecified site: Secondary | ICD-10-CM | POA: Diagnosis present

## 2016-11-13 DIAGNOSIS — I700 Atherosclerosis of aorta: Secondary | ICD-10-CM | POA: Diagnosis not present

## 2016-11-13 LAB — CBC WITH DIFFERENTIAL/PLATELET
BASO%: 0.7 % (ref 0.0–2.0)
Basophils Absolute: 0 10*3/uL (ref 0.0–0.1)
EOS ABS: 0.2 10*3/uL (ref 0.0–0.5)
EOS%: 5.1 % (ref 0.0–7.0)
HEMATOCRIT: 30.1 % — AB (ref 34.8–46.6)
HEMOGLOBIN: 9.9 g/dL — AB (ref 11.6–15.9)
LYMPH#: 1.5 10*3/uL (ref 0.9–3.3)
LYMPH%: 48.1 % (ref 14.0–49.7)
MCH: 27.3 pg (ref 25.1–34.0)
MCHC: 32.8 g/dL (ref 31.5–36.0)
MCV: 83.3 fL (ref 79.5–101.0)
MONO#: 0.3 10*3/uL (ref 0.1–0.9)
MONO%: 11.2 % (ref 0.0–14.0)
NEUT%: 34.9 % — ABNORMAL LOW (ref 38.4–76.8)
NEUTROS ABS: 1.1 10*3/uL — AB (ref 1.5–6.5)
Platelets: 129 10*3/uL — ABNORMAL LOW (ref 145–400)
RBC: 3.62 10*6/uL — ABNORMAL LOW (ref 3.70–5.45)
RDW: 16.2 % — AB (ref 11.2–14.5)
WBC: 3 10*3/uL — AB (ref 3.9–10.3)

## 2016-11-13 LAB — COMPREHENSIVE METABOLIC PANEL
ALT: 9 U/L (ref 0–55)
ANION GAP: 7 meq/L (ref 3–11)
AST: 16 U/L (ref 5–34)
Albumin: 3.7 g/dL (ref 3.5–5.0)
Alkaline Phosphatase: 73 U/L (ref 40–150)
BUN: 21.8 mg/dL (ref 7.0–26.0)
CALCIUM: 10 mg/dL (ref 8.4–10.4)
CHLORIDE: 106 meq/L (ref 98–109)
CO2: 24 mEq/L (ref 22–29)
CREATININE: 1 mg/dL (ref 0.6–1.1)
EGFR: 61 mL/min/{1.73_m2} — AB (ref >90)
Glucose: 75 mg/dl (ref 70–140)
Potassium: 4.5 mEq/L (ref 3.5–5.1)
Sodium: 137 mEq/L (ref 136–145)
Total Bilirubin: 0.51 mg/dL (ref 0.20–1.20)
Total Protein: 7.7 g/dL (ref 6.4–8.3)

## 2016-11-13 LAB — LACTATE DEHYDROGENASE: LDH: 229 U/L (ref 125–245)

## 2016-11-13 LAB — GLUCOSE, CAPILLARY: GLUCOSE-CAPILLARY: 75 mg/dL (ref 65–99)

## 2016-11-13 MED ORDER — TRAMADOL HCL 50 MG PO TABS: 50.0000 mg | ORAL_TABLET | Freq: Four times a day (QID) | ORAL | 0 refills | Status: DC | PRN

## 2016-11-13 MED ORDER — FLUDEOXYGLUCOSE F - 18 (FDG) INJECTION: 6.0000 | Freq: Once | INTRAVENOUS | Status: DC | PRN

## 2016-11-13 NOTE — Telephone Encounter (Signed)
Appointments scheduled per 11/2 LOS. Patient given AVS report and calendars with future scheduled appointments °

## 2016-11-13 NOTE — Assessment & Plan Note (Signed)
Likely due to lymphoma or other causes She does not need transfusions for now She is not symptomatic I will order serum vitamin B-12 and iron studies in her next visit

## 2016-11-13 NOTE — Assessment & Plan Note (Signed)
she will continue current medical management. I recommend close follow-up with primary care doctor for medication adjustment.  

## 2016-11-13 NOTE — Assessment & Plan Note (Signed)
The patient had good response to treatment PET CT scan is reviewed There might be still be mild residual disease but she is not symptomatic. She is very frail Recommend observation only. I will see her back in a few months with repeat history, physical examination and blood work

## 2016-11-13 NOTE — Progress Notes (Signed)
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[place])   05/18/2015 Imaging    Ct abdomen and pelvis showed reactive lymphadenopathy and stool filled colon      02/13/2016 Imaging    Ct chest showed pneumonia      05/08/2016 Imaging    screening mammogram revealed right axillary lymphadenopathy      05/12/2016 Procedure    She underwent core needle biopsy of axillary LN      05/12/2016 Pathology Results    [phone] Biopsy showed low grade non-Hodgkin B cell lymphoma, likely marginal zone lymphoma      05/15/2016 Pathology Results    Peripheral blood positive for deletion 13q      05/25/2016 PET scan    Mildly hypermetabolic mediastinal, left axillary and right inguinal lymph nodes. 2. Mild hypermetabolism in the left thyroid, nonspecific      06/27/2016 Imaging    Ct abdomen showed sigmoid volvulus resulting in large bowel obstruction. Splenomegaly, in keeping with patient's history of lymphoma.       06/30/2016 Pathology Results    Accession: [id number] pathology specimen showed low grade lymphoma      06/30/2016 Surgery    She had LAPAROSCOPIC SIGMOID COLECTOMY,  Rigid sigmoidoscopy, with Primary repair recurrent incisional hernia         11/13/2016 PET scan    No evidence disease progression. 2. Mild decrease in metabolic activity in axillary lymph nodes. 3. Similar metabolic activity of small mediastinal lymph nodes [place] 3 . 4. Similar activity in inguinal lymph nodes. 5. No abnormal bowel activity identified       INTERVAL HISTORY: Please see below for problem oriented charting. She returns  with her husband to review test results She feels well. Denies recent infection. No new lymphadenopathy. The patient denies any recent signs or symptoms of bleeding such as spontaneous epistaxis, hematuria or hematochezia.   REVIEW OF SYSTEMS:   Constitutional: Denies fevers, chills or abnormal weight loss Eyes: Denies blurriness of vision Ears, nose, mouth, throat, and face: Denies mucositis or sore throat Respiratory: Denies cough, dyspnea or wheezes Cardiovascular: Denies palpitation, chest discomfort or lower extremity swelling Gastrointestinal:  Denies nausea, heartburn or change in bowel habits Skin: Denies abnormal skin rashes Lymphatics: Denies new lymphadenopathy or easy bruising Neurological:Denies numbness, tingling or new weaknesses Behavioral/Psych: Mood is stable, no new changes  All other systems were reviewed with the patient and are negative.  I have reviewed the past medical history, past surgical history, social history and family history with the patient and they are unchanged from previous note.  ALLERGIES:  is allergic to morphine and related.  MEDICATIONS:  Current Outpatient Prescriptions  Medication Sig Dispense Refill  . allopurinol (ZYLOPRIM) 100 MG tablet Take 1 tablet (100 mg total) by mouth daily. 30 tablet 0  . aspirin EC 81 MG tablet Take 1 tablet (81 mg total) by mouth daily. 30 tablet 0  . bacitracin ointment Apply 1 application topically 2 (two) times daily. 120 g 0  . feeding supplement, ENSURE ENLIVE, (ENSURE ENLIVE) LIQD Take 237 mLs by mouth 2 (two) times daily between meals. 237 mL 12  . furosemide (LASIX) 20 MG tablet Take 20 mg  by mouth daily as needed for fluid.    . hydrALAZINE (APRESOLINE) 25 MG tablet Take 1 tablet (25 mg total) by mouth every 8 (eight) hours. 90 tablet 0  . Loratadine (CLARITIN PO) Take 10 mg by mouth.    . methocarbamol (ROBAXIN) 500 MG tablet Take 1 tablet (500 mg total) by mouth every 6 (six) hours as needed for  muscle spasms. 30 tablet 0  . pantoprazole (PROTONIX) 40 MG tablet Take 1 tablet (40 mg total) by mouth daily. 30 tablet 0  . polyethylene glycol (MIRALAX / GLYCOLAX) packet Take 17 g by mouth 2 (two) times daily. 14 each 0  . prednisoLONE acetate (PRED FORTE) 1 % ophthalmic suspension Place 1-2 drops into both eyes See admin instructions. 2 drops in right eye and 1 drop in the left eye twice daily    . prochlorperazine (COMPAZINE) 10 MG tablet Take 1 tablet (10 mg total) by mouth every 6 (six) hours as needed for nausea or vomiting. 30 tablet 1  . sertraline (ZOLOFT) 25 MG tablet TAKE 1 TABLET BY MOUTH DAILY 30 tablet 0  . traMADol (ULTRAM) 50 MG tablet Take 1 tablet (50 mg total) by mouth every 6 (six) hours as needed for moderate pain or severe pain. 60 tablet 0  . amoxicillin-clavulanate (AUGMENTIN) 875-125 MG tablet Take 1 tablet by mouth 2 (two) times daily. (Patient not taking: Reported on 11/13/2016) 20 tablet 0   No current facility-administered medications for this visit.    Facility-Administered Medications Ordered in Other Visits  Medication Dose Route Frequency Provider Last Rate Last Dose  . fludeoxyglucose F - 18 (FDG) injection 6 millicurie  6 millicurie Intravenous Once PRN Lorriane Shire, MD        PHYSICAL EXAMINATION: ECOG PERFORMANCE STATUS: 2 - Symptomatic, <50% confined to bed  Vitals:   11/13/16 1040  BP: (!) 170/66  Pulse: 66  Resp: 16  Temp: 98.5 F (36.9 C)   Filed Weights    GENERAL:alert, no distress and comfortable. She looks thin and cachectic SKIN: skin color, texture, turgor are normal, no rashes or significant lesions EYES: normal, Conjunctiva are pink and non-injected, sclera clear OROPHARYNX:no exudate, no erythema and lips, buccal mucosa, and tongue normal  NECK: supple, thyroid normal size, non-tender, without nodularity LYMPH:  no palpable lymphadenopathy in the cervical, axillary or inguinal LUNGS: clear to auscultation and percussion with  normal breathing effort HEART: regular rate & rhythm and no murmurs and no lower extremity edema ABDOMEN:abdomen soft, non-tender and normal bowel sounds Musculoskeletal:no cyanosis of digits and no clubbing  NEURO: alert & oriented x 3 with fluent speech, no focal motor/sensory deficits  LABORATORY DATA:  I have reviewed the data as listed    Component Value Date/Time   NA 137 11/13/2016 1014   K 4.5 11/13/2016 1014   CL 111 07/02/2016 0401   CO2 24 11/13/2016 1014   GLUCOSE 75 11/13/2016 1014   BUN 21.8 11/13/2016 1014   CREATININE 1.0 11/13/2016 1014   CALCIUM 10.0 11/13/2016 1014   PROT 7.7 11/13/2016 1014   ALBUMIN 3.7 11/13/2016 1014   AST 16 11/13/2016 1014   ALT 9 11/13/2016 1014   ALKPHOS 73 11/13/2016 1014   BILITOT 0.51 11/13/2016 1014   GFRNONAA 59 (L) 07/02/2016 0401   GFRNONAA >60 07/02/2016 0401   GFRAA >60 07/02/2016 0401   GFRAA >60 07/02/2016 0401    No results found for: SPEP, UPEP  Lab Results  Component Value Date   WBC 3.0 (L)  11/13/2016   NEUTROABS 1.1 (L) 11/13/2016   HGB 9.9 (L) 11/13/2016   HCT 30.1 (L) 11/13/2016   MCV 83.3 11/13/2016   PLT 129 (L) 11/13/2016      Chemistry      Component Value Date/Time   NA 137 11/13/2016 1014   K 4.5 11/13/2016 1014   CL 111 07/02/2016 0401   CO2 24 11/13/2016 1014   BUN 21.8 11/13/2016 1014   CREATININE 1.0 11/13/2016 1014      Component Value Date/Time   CALCIUM 10.0 11/13/2016 1014   ALKPHOS 73 11/13/2016 1014   AST 16 11/13/2016 1014   ALT 9 11/13/2016 1014   BILITOT 0.51 11/13/2016 1014       RADIOGRAPHIC STUDIES: I have personally reviewed the radiological images as listed and agreed with the findings in the report. Nm Pet Image Restag (ps) Skull Base To Thigh  Result Date: 11/13/2016 CLINICAL DATA:  Subsequent treatment strategy for lymphoma. Low-grade non-Hodgkin's B-cell lymphoma. Surgery for abdominal is obstruction demonstrated lymphoma involvement of the GI tract. EXAM:  NUCLEAR MEDICINE PET SKULL BASE TO THIGH TECHNIQUE: 6.0 mCi F-18 FDG was injected intravenously. Full-ring PET imaging was performed from the skull base to thigh after the radiotracer. CT data was obtained and used for attenuation correction and anatomic localization. FASTING BLOOD GLUCOSE:  Value: 75 mg/dl COMPARISON:  PET-CT 05/25/2016 FINDINGS: NECK No hypermetabolic lymph nodes in the neck. Asymmetric hypermetabolic muscle activity in the RIGHT neck is favored physiologic. Uptake in LEFT and RIGHT lobe of thyroid gland (greater on the LEFT) similar to comparison exam. CHEST Persistent hypermetabolic precarinal lymph node with SUV max equal 4.2 compared to 3.9. Lymph node unchanged in size at 1.5 cm. RIGHT axial lymph node measuring 1.0 cm with SUV max equal 1.8 decreased from 2.6. 8 mm LEFT axial lymph node is decreased in metabolic activity and size from 11 mm on comparison exam with no significant activity. ABDOMEN/PELVIS No abnormal hypermetabolic activity within the liver, pancreas, adrenal glands, or spleen. No abnormal activity associated with the colon or small bowel. Atherosclerotic calcification of the aorta. Mild activity in the RIGHT inguinal lymph node with SUV max equal 2.6 compares to 2.9 on prior. No change in size. SKELETON No focal hypermetabolic activity to suggest skeletal metastasis. IMPRESSION: 1. No evidence disease progression. 2. Mild decrease in metabolic activity in axillary lymph nodes. 3. Similar metabolic activity of small mediastinal lymph nodes [place] 3 . 4. Similar activity in inguinal lymph nodes. 5. No abnormal bowel activity identified. Electronically Signed   By: Suzy Bouchard M.D.   On: 11/13/2016 10:36    ASSESSMENT & PLAN:  Marginal zone lymphoma of axilla ([facility]) The patient had good response to treatment PET CT scan is reviewed There might be still be mild residual disease but she is not symptomatic. She is very frail Recommend observation only. I will see  her back in a few months with repeat history, physical examination and blood work  Other pancytopenia ([place]) Likely due to lymphoma or other causes She does not need transfusions for now She is not symptomatic I will order serum vitamin B-12 and iron studies in her next visit  Essential hypertension she will continue current medical management. I recommend close follow-up with primary care doctor for medication adjustment.    Orders Placed This Encounter  Procedures  . CBC with Differential/Platelet    Standing Status:   Future    Standing Expiration Date:   12/18/2017  . Comprehensive metabolic panel  Standing Status:   Future    Standing Expiration Date:   12/18/2017  . Lactate dehydrogenase    Standing Status:   Future    Standing Expiration Date:   12/18/2017  . Vitamin B12    Standing Status:   Future    Standing Expiration Date:   12/18/2017  . Iron and TIBC    Standing Status:   Future    Standing Expiration Date:   12/18/2017  . Ferritin    Standing Status:   Future    Standing Expiration Date:   12/18/2017   All questions were answered. The patient knows to call the clinic with any problems, questions or concerns. No barriers to learning was detected. I spent 15 minutes counseling the patient face to face. The total time spent in the appointment was 20 minutes and more than 50% was on counseling and review of test results     Heath Lark, MD 11/13/2016 2:11 PM

## 2016-11-22 ENCOUNTER — Inpatient Hospital Stay (HOSPITAL_COMMUNITY)
Admission: EM | Admit: 2016-11-22 | Discharge: 2016-11-26 | DRG: 481 | Disposition: A | Discharge status: Skilled Nursing Facility | Payer: Medicare Other | Attending: Internal Medicine | Admitting: Internal Medicine

## 2016-11-22 DIAGNOSIS — M25559 Pain in unspecified hip: Secondary | ICD-10-CM

## 2016-11-22 DIAGNOSIS — S72143A Displaced intertrochanteric fracture of unspecified femur, initial encounter for closed fracture: Secondary | ICD-10-CM | POA: Diagnosis present

## 2016-11-22 DIAGNOSIS — D696 Thrombocytopenia, unspecified: Secondary | ICD-10-CM | POA: Diagnosis present

## 2016-11-22 DIAGNOSIS — Z823 Family history of stroke: Secondary | ICD-10-CM

## 2016-11-22 DIAGNOSIS — C8304 Small cell B-cell lymphoma, lymph nodes of axilla and upper limb: Secondary | ICD-10-CM | POA: Diagnosis present

## 2016-11-22 DIAGNOSIS — Z8249 Family history of ischemic heart disease and other diseases of the circulatory system: Secondary | ICD-10-CM

## 2016-11-22 DIAGNOSIS — Y92009 Unspecified place in unspecified non-institutional (private) residence as the place of occurrence of the external cause: Secondary | ICD-10-CM

## 2016-11-22 DIAGNOSIS — W19XXXA Unspecified fall, initial encounter: Secondary | ICD-10-CM | POA: Diagnosis present

## 2016-11-22 DIAGNOSIS — E119 Type 2 diabetes mellitus without complications: Secondary | ICD-10-CM | POA: Diagnosis present

## 2016-11-22 DIAGNOSIS — I959 Hypotension, unspecified: Secondary | ICD-10-CM | POA: Diagnosis not present

## 2016-11-22 DIAGNOSIS — S72142A Displaced intertrochanteric fracture of left femur, initial encounter for closed fracture: Secondary | ICD-10-CM | POA: Diagnosis not present

## 2016-11-22 DIAGNOSIS — E785 Hyperlipidemia, unspecified: Secondary | ICD-10-CM | POA: Diagnosis present

## 2016-11-22 DIAGNOSIS — Z7982 Long term (current) use of aspirin: Secondary | ICD-10-CM

## 2016-11-22 DIAGNOSIS — Z87891 Personal history of nicotine dependence: Secondary | ICD-10-CM

## 2016-11-22 DIAGNOSIS — D62 Acute posthemorrhagic anemia: Secondary | ICD-10-CM

## 2016-11-22 DIAGNOSIS — I10 Essential (primary) hypertension: Secondary | ICD-10-CM | POA: Diagnosis present

## 2016-11-22 DIAGNOSIS — Z9221 Personal history of antineoplastic chemotherapy: Secondary | ICD-10-CM

## 2016-11-22 DIAGNOSIS — K08409 Partial loss of teeth, unspecified cause, unspecified class: Secondary | ICD-10-CM

## 2016-11-22 DIAGNOSIS — Z8261 Family history of arthritis: Secondary | ICD-10-CM

## 2016-11-22 DIAGNOSIS — F410 Panic disorder [episodic paroxysmal anxiety] without agoraphobia: Secondary | ICD-10-CM | POA: Diagnosis present

## 2016-11-22 DIAGNOSIS — D638 Anemia in other chronic diseases classified elsewhere: Secondary | ICD-10-CM | POA: Diagnosis present

## 2016-11-22 DIAGNOSIS — N179 Acute kidney failure, unspecified: Secondary | ICD-10-CM | POA: Diagnosis not present

## 2016-11-22 DIAGNOSIS — Z8673 Personal history of transient ischemic attack (TIA), and cerebral infarction without residual deficits: Secondary | ICD-10-CM

## 2016-11-22 DIAGNOSIS — Z419 Encounter for procedure for purposes other than remedying health state, unspecified: Secondary | ICD-10-CM

## 2016-11-22 DIAGNOSIS — S7292XA Unspecified fracture of left femur, initial encounter for closed fracture: Secondary | ICD-10-CM | POA: Diagnosis present

## 2016-11-22 DIAGNOSIS — M109 Gout, unspecified: Secondary | ICD-10-CM | POA: Diagnosis present

## 2016-11-22 DIAGNOSIS — K219 Gastro-esophageal reflux disease without esophagitis: Secondary | ICD-10-CM | POA: Diagnosis present

## 2016-11-22 DIAGNOSIS — Z811 Family history of alcohol abuse and dependence: Secondary | ICD-10-CM

## 2016-11-22 DIAGNOSIS — S72002A Fracture of unspecified part of neck of left femur, initial encounter for closed fracture: Secondary | ICD-10-CM

## 2016-11-22 DIAGNOSIS — W050XXA Fall from non-moving wheelchair, initial encounter: Secondary | ICD-10-CM | POA: Diagnosis present

## 2016-11-22 NOTE — ED Triage Notes (Addendum)
Patient BIB [facility] from home for fall transferring from bed to wheelchair. No LOC, no dizziness. C/O  L hip pain EMS reports slight shortening with external rotation. Good pms and pulse reported by EMS but pt unable to lift leg. bp elevated 190/90 w/ hx HTN. Fall happened around 2230. Pt is a CA pt, pt and family thinks last chemo treatment was Nov. 24, but are not positive.

## 2016-11-22 NOTE — ED Notes (Signed)
Bed: WA17 Expected date:  Expected time:  Means of arrival:  Comments: 83 yo F/Fall left hip pain

## 2016-11-22 NOTE — ED Provider Notes (Signed)
[facility] DEPT Provider Note   CSN: [id number] Arrival date & time: 11/22/16  2345  By signing my name below, I, Neta Mends, attest that this documentation has been prepared under the direction and in the presence of Brentley Landfair, MD . Electronically Signed: Neta Mends, ED Scribe. 11/23/2016. 12:05 AM.    History   Chief Complaint Chief Complaint  Patient presents with  . Hip Pain    The history is provided by the patient. No language interpreter was used.  Hip Pain  This is a new problem. The current episode started 1 to 2 hours ago. The problem occurs constantly. The problem has not changed since onset.Pertinent negatives include no chest pain and no headaches. Nothing aggravates the symptoms. Nothing relieves the symptoms. She has tried nothing for the symptoms. The treatment provided no relief.   HPI Comments:  LASSIE PETROVSKI is a 83 y.o. female with PMHx of DM who presents to the Emergency Department complaining of constant left hip pain due to a fall that occurred at 2230 yesterday. Pt reports that she was getting in to her wheelchair but the wheels were not locked and the chair slipped causing her to fall and land on her left hip. Pt denies any head trauma. No alleviating factors noted. Pt denies other associated symptoms.   Past Medical History:  Diagnosis Date  . Acid reflux disease   . Cancer [facility])    Looking for documentation of this  . CLL (chronic lymphocytic leukemia) ([place]) 05/15/2016  . DDD (degenerative disc disease), lumbar   . Degenerative joint disease   . Diabetes mellitus without complication ([place])   . Dyslipidemia   . Hypertension   . 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 Panic attacks   . Pneumonia 02/2016  . 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 Stroke [facility]) age 64 yo   poorly controlled DM and Hypertension    Patient Active Problem List   Diagnosis Date Noted  . Other pancytopenia ([place])  11/13/2016  . Sinusitis 10/14/2016  . Bleeding external hemorrhoids 08/20/2016  . Anemia due to chronic illness 08/14/2016  . Recurrent ventral incisional hernia s/p primary repair 06/30/2016 07/01/2016  . Pressure ulcer 05/20/2016  . Lower extremity edema 05/20/2016  . Protein-calorie malnutrition, severe ([place]) 05/20/2016  . Volvulus of sigmoid colon ([place])   . Sigmoid volvulus s/p lap sigmoid colectomy 06/30/2016 05/19/2016  . 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 Constipation 05/15/2016  . Other fatigue 05/15/2016  . CAP (community acquired pneumonia) 02/13/2016  . Anemia, unspecified 02/13/2016  . Thrombocytopenia ([place]) 02/13/2016  . Hyponatremia 02/13/2016  . Kyphosis 02/01/2016  . Pressure ulcer of coccygeal region 01/31/2016  . Mobility impaired 01/31/2016  . Osteoarthritis 01/31/2016  . Essential hypertension 09/06/2011  . Ovarian torsion 08/25/2011    Past Surgical History:  Procedure Laterality Date  . ABDOMINAL HYSTERECTOMY  ? 08/25/2011 ?   Has BSO for left benign ovarian tumor with torsion and broad ligament benign tumor, but does not appear had hysterectomy--[facility], NOT ovarian cancer  . CHOLECYSTECTOMY    . FLEXIBLE SIGMOIDOSCOPY N/A 05/19/2016   Procedure: FLEXIBLE SIGMOIDOSCOPY;  Surgeon: [name], MD;  Location: [facility];  Service: Endoscopy;  Laterality: N/A;  . FLEXIBLE SIGMOIDOSCOPY N/A 06/28/2016   Procedure: FLEXIBLE SIGMOIDOSCOPY;  Surgeon: Ladene Artist, MD;  Location: [facility];  Service: Endoscopy;  Laterality: N/A;  . LAPAROSCOPIC SIGMOID COLECTOMY N/A 06/30/2016   Procedure: LAPAROSCOPIC SIGMOID COLECTOMY,rigid sigmoidoscopy, repair recurrent incisional hernia;  Surgeon: Michael Boston, MD;  Location[facility];  Service: General;  Laterality: N/A;  . LAPAROTOMY N/A 04/29/2014   Procedure: EXPLORATORY LAPAROTOMY;  Surgeon: Imogene Burn. Georgette Dover, MD;  Location: [place];   Service: General;  Laterality: N/A;  . LYSIS OF ADHESION N/A 04/29/2014   Procedure: EXTENSIVE LYSIS OF ADHESIONS;  Surgeon: Imogene Burn. Georgette Dover, MD;  Location: [place];  Service: General;  Laterality: N/A;  . VENTRAL HERNIA REPAIR N/A 04/29/2014   Procedure: PRIMARY REPAIR OF VENTRAL HERNIA;  Surgeon: Imogene Burn. Georgette Dover, MD;  Location: [place];  Service: General;  Laterality: N/A;    OB History    No data available       Home Medications    Prior to Admission medications   Medication Sig Start Date End Date Taking? Authorizing Provider  allopurinol (ZYLOPRIM) 100 MG tablet Take 1 tablet (100 mg total) by mouth daily. 09/21/16   Heath Lark, MD  amoxicillin-clavulanate (AUGMENTIN) 875-125 MG tablet Take 1 tablet by mouth 2 (two) times daily. Patient not taking: Reported on 11/13/2016 10/13/16   Susanne Borders, NP  aspirin EC 81 MG tablet Take 1 tablet (81 mg total) by mouth daily. 06/05/16   Heath Lark, MD  bacitracin ointment Apply 1 application topically 2 (two) times daily. 09/19/16   Montine Circle, PA-C  feeding supplement, ENSURE ENLIVE, (ENSURE ENLIVE) LIQD Take 237 mLs by mouth 2 (two) times daily between meals. 07/03/16   Lavina Hamman, MD  furosemide (LASIX) 20 MG tablet Take 20 mg by mouth daily as needed for fluid.    Historical Provider, MD  hydrALAZINE (APRESOLINE) 25 MG tablet Take 1 tablet (25 mg total) by mouth every 8 (eight) hours. 07/03/16   Lavina Hamman, MD  Loratadine (CLARITIN PO) Take 10 mg by mouth.    Historical Provider, MD  methocarbamol (ROBAXIN) 500 MG tablet Take 1 tablet (500 mg total) by mouth every 6 (six) hours as needed for muscle spasms. 07/03/16   Lavina Hamman, MD  pantoprazole (PROTONIX) 40 MG tablet Take 1 tablet (40 mg total) by mouth daily. 06/05/16   Heath Lark, MD  polyethylene glycol (MIRALAX / GLYCOLAX) packet Take 17 g by mouth 2 (two) times daily. 05/22/16   Charlynne Cousins, MD  prednisoLONE acetate (PRED FORTE) 1 % ophthalmic suspension Place 1-2 drops  into both eyes See admin instructions. 2 drops in right eye and 1 drop in the left eye twice daily    Historical Provider, MD  prochlorperazine (COMPAZINE) 10 MG tablet Take 1 tablet (10 mg total) by mouth every 6 (six) hours as needed for nausea or vomiting. 08/28/16   Heath Lark, MD  sertraline (ZOLOFT) 25 MG tablet TAKE 1 TABLET BY MOUTH DAILY 09/17/16   Heath Lark, MD  traMADol (ULTRAM) 50 MG tablet Take 1 tablet (50 mg total) by mouth every 6 (six) hours as needed for moderate pain or severe pain. 11/13/16   Heath Lark, MD    Family History Family History  Problem Relation Age of Onset  . Hypertension Mother   . Stroke Mother   . Arthritis Mother   . Alcohol abuse Father     Social History Social History  Substance Use Topics  . Smoking status: Former Smoker    Types: Cigarettes    Quit date: 02/18/1990  . Smokeless tobacco: Former Systems developer    Types:  Snuff    Quit date: 02/18/1990  . Alcohol use No     Comment: Only drank alcohol between ages of 64 and 48 yo.  Her father gave it to her     Allergies   Morphine and related   Review of Systems Review of Systems  Eyes: Negative for photophobia.  Cardiovascular: Negative for chest pain.  Musculoskeletal: Positive for arthralgias. Negative for back pain and neck pain.  Neurological: Negative for syncope, weakness, numbness and headaches.  All other systems reviewed and are negative.    Physical Exam Updated Vital Signs BP 188/90 (BP Location: Right Arm)   Pulse 63   Temp 98.2 F (36.8 C) (Oral)   Resp 18   SpO2 94%   Physical Exam  Constitutional: She is oriented to person, place, and time. She appears well-developed and well-nourished. No distress.  HENT:  Head: Normocephalic and atraumatic.  Mouth/Throat: Oropharynx is clear and moist. No oropharyngeal exudate.  Trachea midline  Eyes: Conjunctivae and EOM are normal. Pupils are equal, round, and reactive to light.  Neck: Trachea normal and normal range of motion.  Neck supple. No JVD present. Carotid bruit is not present.  Cardiovascular: Normal rate, regular rhythm and intact distal pulses.  Exam reveals no gallop and no friction rub.   No murmur heard. Pulmonary/Chest: Effort normal and breath sounds normal. No stridor. She has no wheezes. She has no rales.  Abdominal: Soft. Bowel sounds are normal. She exhibits no mass. There is no tenderness. There is no rebound and no guarding.  Musculoskeletal: Normal range of motion.  Left leg foreshortened and rotated externally.   Lymphadenopathy:    She has no cervical adenopathy.  Neurological: She is alert and oriented to person, place, and time. She has normal reflexes. She displays normal reflexes. No cranial nerve deficit. She exhibits normal muscle tone. Coordination normal.  Cranial nerves 2-12 intact  Skin: Skin is warm and dry. Capillary refill takes less than 2 seconds. She is not diaphoretic.  Psychiatric: She has a normal mood and affect. Her behavior is normal.     ED Treatments / Results   Vitals:   11/23/16 0033 11/23/16 0106  BP: 188/90 165/67  Pulse: 60 63  Resp: 17 20  Temp:     DIAGNOSTIC STUDIES: Results for orders placed or performed during the hospital encounter of 11/22/16  CBC with Differential/Platelet  Result Value Ref Range   WBC 4.3 4.0 - 10.5 K/uL   RBC 3.83 (L) 3.87 - 5.11 MIL/uL   Hemoglobin 10.2 (L) 12.0 - 15.0 g/dL   HCT 31.6 (L) 36.0 - 46.0 %   MCV 82.5 78.0 - 100.0 fL   MCH 26.6 26.0 - 34.0 pg   MCHC 32.3 30.0 - 36.0 g/dL   RDW 14.9 11.5 - 15.5 %   Platelets 138 (L) 150 - 400 K/uL   Neutrophils Relative % 66 %   Neutro Abs 2.8 1.7 - 7.7 K/uL   Lymphocytes Relative 25 %   Lymphs Abs 1.0 0.7 - 4.0 K/uL   Monocytes Relative 7 %   Monocytes Absolute 0.3 0.1 - 1.0 K/uL   Eosinophils Relative 2 %   Eosinophils Absolute 0.1 0.0 - 0.7 K/uL   Basophils Relative 0 %   Basophils Absolute 0.0 0.0 - 0.1 K/uL  I-Stat Chem 8, ED  Result Value Ref Range   Sodium  139 135 - 145 mmol/L   Potassium 4.7 3.5 - 5.1 mmol/L   Chloride 102 101 - 111  mmol/L   BUN 10 6 - 20 mg/dL   Creatinine, Ser 0.80 0.44 - 1.00 mg/dL   Glucose, Bld 130 (H) 65 - 99 mg/dL   Calcium, Ion 1.29 1.15 - 1.40 mmol/L   TCO2 26 0 - 100 mmol/L   Hemoglobin 11.2 (L) 12.0 - 15.0 g/dL   HCT 33.0 (L) 36.0 - 46.0 %   Nm Pet Image Restag (ps) Skull Base To Thigh  Result Date: 11/13/2016 CLINICAL DATA:  Subsequent treatment strategy for lymphoma. Low-grade non-Hodgkin's B-cell lymphoma. Surgery for abdominal is obstruction demonstrated lymphoma involvement of the GI tract. EXAM: NUCLEAR MEDICINE PET SKULL BASE TO THIGH TECHNIQUE: 6.0 mCi F-18 FDG was injected intravenously. Full-ring PET imaging was performed from the skull base to thigh after the radiotracer. CT data was obtained and used for attenuation correction and anatomic localization. FASTING BLOOD GLUCOSE:  Value: 75 mg/dl COMPARISON:  PET-CT 05/25/2016 FINDINGS: NECK No hypermetabolic lymph nodes in the neck. Asymmetric hypermetabolic muscle activity in the RIGHT neck is favored physiologic. Uptake in LEFT and RIGHT lobe of thyroid gland (greater on the LEFT) similar to comparison exam. CHEST Persistent hypermetabolic precarinal lymph node with SUV max equal 4.2 compared to 3.9. Lymph node unchanged in size at 1.5 cm. RIGHT axial lymph node measuring 1.0 cm with SUV max equal 1.8 decreased from 2.6. 8 mm LEFT axial lymph node is decreased in metabolic activity and size from 11 mm on comparison exam with no significant activity. ABDOMEN/PELVIS No abnormal hypermetabolic activity within the liver, pancreas, adrenal glands, or spleen. No abnormal activity associated with the colon or small bowel. Atherosclerotic calcification of the aorta. Mild activity in the RIGHT inguinal lymph node with SUV max equal 2.6 compares to 2.9 on prior. No change in size. SKELETON No focal hypermetabolic activity to suggest skeletal metastasis. IMPRESSION: 1. No  evidence disease progression. 2. Mild decrease in metabolic activity in axillary lymph nodes. 3. Similar metabolic activity of small mediastinal lymph nodes [place] 3 . 4. Similar activity in inguinal lymph nodes. 5. No abnormal bowel activity identified. Electronically Signed   By: Suzy Bouchard M.D.   On: 11/13/2016 10:36    Oxygen Saturation is 99% on RA, normal by my interpretation.    COORDINATION OF CARE:  12:05 AM Discussed treatment plan with pt at bedside and pt agreed to plan.   Labs (all labs ordered are listed, but only abnormal results are displayed) Labs Reviewed - No data to display  EKG  Date: 11/23/2016  Rate:60  Rhythm: normal sinus rhythm  QRS Axis: normal  Intervals: first degree av block  ST/T Wave abnormalities: normal  Conduction Disutrbances: 1st degree avb  Procedures Procedures (including critical care time)  Medications Ordered in ED  Medications  fentaNYL (SUBLIMAZE) injection 50 mcg (not administered)   Case d/w Dr. Wynelle Link who will see the patient    Final Clinical Impressions(s) / ED Diagnoses  Left hip fracture: admit keep NPO.     I personally performed the services described in this documentation, which was scribed in my presence. The recorded information has been reviewed and is accurate.      Veatrice Kells, MD 11/23/16 0145

## 2016-11-23 ENCOUNTER — Emergency Department (HOSPITAL_COMMUNITY): Payer: Medicare Other

## 2016-11-23 ENCOUNTER — Encounter (HOSPITAL_COMMUNITY): Payer: Self-pay | Admitting: Emergency Medicine

## 2016-11-23 ENCOUNTER — Encounter (HOSPITAL_COMMUNITY)
Admission: EM | Disposition: A | Discharge status: Skilled Nursing Facility | Payer: Self-pay | Source: Home / Self Care | Attending: Internal Medicine

## 2016-11-23 ENCOUNTER — Inpatient Hospital Stay (HOSPITAL_COMMUNITY): Payer: Medicare Other

## 2016-11-23 ENCOUNTER — Inpatient Hospital Stay (HOSPITAL_COMMUNITY): Payer: Medicare Other | Admitting: Certified Registered Nurse Anesthetist

## 2016-11-23 DIAGNOSIS — Z9221 Personal history of antineoplastic chemotherapy: Secondary | ICD-10-CM | POA: Diagnosis not present

## 2016-11-23 DIAGNOSIS — W19XXXA Unspecified fall, initial encounter: Secondary | ICD-10-CM | POA: Diagnosis present

## 2016-11-23 DIAGNOSIS — Z7982 Long term (current) use of aspirin: Secondary | ICD-10-CM | POA: Diagnosis not present

## 2016-11-23 DIAGNOSIS — D696 Thrombocytopenia, unspecified: Secondary | ICD-10-CM | POA: Diagnosis present

## 2016-11-23 DIAGNOSIS — Z811 Family history of alcohol abuse and dependence: Secondary | ICD-10-CM | POA: Diagnosis not present

## 2016-11-23 DIAGNOSIS — Z823 Family history of stroke: Secondary | ICD-10-CM | POA: Diagnosis not present

## 2016-11-23 DIAGNOSIS — K219 Gastro-esophageal reflux disease without esophagitis: Secondary | ICD-10-CM | POA: Diagnosis present

## 2016-11-23 DIAGNOSIS — E785 Hyperlipidemia, unspecified: Secondary | ICD-10-CM | POA: Diagnosis present

## 2016-11-23 DIAGNOSIS — D638 Anemia in other chronic diseases classified elsewhere: Secondary | ICD-10-CM | POA: Diagnosis present

## 2016-11-23 DIAGNOSIS — S7292XA Unspecified fracture of left femur, initial encounter for closed fracture: Secondary | ICD-10-CM | POA: Diagnosis not present

## 2016-11-23 DIAGNOSIS — E119 Type 2 diabetes mellitus without complications: Secondary | ICD-10-CM | POA: Diagnosis present

## 2016-11-23 DIAGNOSIS — I10 Essential (primary) hypertension: Secondary | ICD-10-CM | POA: Diagnosis present

## 2016-11-23 DIAGNOSIS — Y92009 Unspecified place in unspecified non-institutional (private) residence as the place of occurrence of the external cause: Secondary | ICD-10-CM | POA: Diagnosis not present

## 2016-11-23 DIAGNOSIS — W050XXA Fall from non-moving wheelchair, initial encounter: Secondary | ICD-10-CM | POA: Diagnosis present

## 2016-11-23 DIAGNOSIS — C8304 Small cell B-cell lymphoma, lymph nodes of axilla and upper limb: Secondary | ICD-10-CM | POA: Diagnosis present

## 2016-11-23 DIAGNOSIS — Z8261 Family history of arthritis: Secondary | ICD-10-CM | POA: Diagnosis not present

## 2016-11-23 DIAGNOSIS — S72145S Nondisplaced intertrochanteric fracture of left femur, sequela: Secondary | ICD-10-CM | POA: Diagnosis not present

## 2016-11-23 DIAGNOSIS — S72142A Displaced intertrochanteric fracture of left femur, initial encounter for closed fracture: Secondary | ICD-10-CM | POA: Diagnosis present

## 2016-11-23 DIAGNOSIS — Z8249 Family history of ischemic heart disease and other diseases of the circulatory system: Secondary | ICD-10-CM | POA: Diagnosis not present

## 2016-11-23 DIAGNOSIS — Z87891 Personal history of nicotine dependence: Secondary | ICD-10-CM | POA: Diagnosis not present

## 2016-11-23 DIAGNOSIS — D62 Acute posthemorrhagic anemia: Secondary | ICD-10-CM | POA: Diagnosis not present

## 2016-11-23 DIAGNOSIS — I959 Hypotension, unspecified: Secondary | ICD-10-CM | POA: Diagnosis not present

## 2016-11-23 DIAGNOSIS — M25559 Pain in unspecified hip: Secondary | ICD-10-CM | POA: Diagnosis present

## 2016-11-23 DIAGNOSIS — Z8673 Personal history of transient ischemic attack (TIA), and cerebral infarction without residual deficits: Secondary | ICD-10-CM | POA: Diagnosis not present

## 2016-11-23 DIAGNOSIS — N179 Acute kidney failure, unspecified: Secondary | ICD-10-CM | POA: Diagnosis not present

## 2016-11-23 DIAGNOSIS — S72143A Displaced intertrochanteric fracture of unspecified femur, initial encounter for closed fracture: Secondary | ICD-10-CM | POA: Diagnosis present

## 2016-11-23 DIAGNOSIS — F410 Panic disorder [episodic paroxysmal anxiety] without agoraphobia: Secondary | ICD-10-CM | POA: Diagnosis present

## 2016-11-23 DIAGNOSIS — M109 Gout, unspecified: Secondary | ICD-10-CM | POA: Diagnosis present

## 2016-11-23 HISTORY — PX: FEMUR IM NAIL: SHX1597

## 2016-11-23 LAB — CBC WITH DIFFERENTIAL/PLATELET
BASOS ABS: 0 10*3/uL (ref 0.0–0.1)
Basophils Relative: 0 %
Eosinophils Absolute: 0.1 10*3/uL (ref 0.0–0.7)
Eosinophils Relative: 2 %
HEMATOCRIT: 31.6 % — AB (ref 36.0–46.0)
Hemoglobin: 10.2 g/dL — ABNORMAL LOW (ref 12.0–15.0)
LYMPHS PCT: 25 %
Lymphs Abs: 1 10*3/uL (ref 0.7–4.0)
MCH: 26.6 pg (ref 26.0–34.0)
MCHC: 32.3 g/dL (ref 30.0–36.0)
MCV: 82.5 fL (ref 78.0–100.0)
Monocytes Absolute: 0.3 10*3/uL (ref 0.1–1.0)
Monocytes Relative: 7 %
NEUTROS ABS: 2.8 10*3/uL (ref 1.7–7.7)
Neutrophils Relative %: 66 %
Platelets: 138 10*3/uL — ABNORMAL LOW (ref 150–400)
RBC: 3.83 MIL/uL — AB (ref 3.87–5.11)
RDW: 14.9 % (ref 11.5–15.5)
WBC: 4.3 10*3/uL (ref 4.0–10.5)

## 2016-11-23 LAB — I-STAT CHEM 8, ED
BUN: 10 mg/dL (ref 6–20)
CHLORIDE: 102 mmol/L (ref 101–111)
CREATININE: 0.8 mg/dL (ref 0.44–1.00)
Calcium, Ion: 1.29 mmol/L (ref 1.15–1.40)
GLUCOSE: 130 mg/dL — AB (ref 65–99)
HCT: 33 % — ABNORMAL LOW (ref 36.0–46.0)
Hemoglobin: 11.2 g/dL — ABNORMAL LOW (ref 12.0–15.0)
POTASSIUM: 4.7 mmol/L (ref 3.5–5.1)
Sodium: 139 mmol/L (ref 135–145)
TCO2: 26 mmol/L (ref 0–100)

## 2016-11-23 LAB — CBC
HCT: 28.1 % — ABNORMAL LOW (ref 36.0–46.0)
HEMOGLOBIN: 9.2 g/dL — AB (ref 12.0–15.0)
MCH: 27.1 pg (ref 26.0–34.0)
MCHC: 32.7 g/dL (ref 30.0–36.0)
MCV: 82.9 fL (ref 78.0–100.0)
Platelets: 134 10*3/uL — ABNORMAL LOW (ref 150–400)
RBC: 3.39 MIL/uL — AB (ref 3.87–5.11)
RDW: 15.1 % (ref 11.5–15.5)
WBC: 3.8 10*3/uL — ABNORMAL LOW (ref 4.0–10.5)

## 2016-11-23 LAB — BASIC METABOLIC PANEL
ANION GAP: 7 (ref 5–15)
BUN: 12 mg/dL (ref 6–20)
CHLORIDE: 103 mmol/L (ref 101–111)
CO2: 26 mmol/L (ref 22–32)
Calcium: 9.3 mg/dL (ref 8.9–10.3)
Creatinine, Ser: 0.73 mg/dL (ref 0.44–1.00)
GFR calc non Af Amer: >60 mL/min (ref >60)
Glucose, Bld: 157 mg/dL — ABNORMAL HIGH (ref 65–99)
POTASSIUM: 4.1 mmol/L (ref 3.5–5.1)
Sodium: 136 mmol/L (ref 135–145)

## 2016-11-23 LAB — SURGICAL PCR SCREEN
MRSA, PCR: NEGATIVE
Staphylococcus aureus: NEGATIVE

## 2016-11-23 SURGERY — INSERTION, INTRAMEDULLARY ROD, FEMUR
Anesthesia: General | Site: Hip | Laterality: Left

## 2016-11-23 MED ORDER — CEFAZOLIN SODIUM-DEXTROSE 2-4 GM/100ML-% IV SOLN
INTRAVENOUS | Status: AC
  Filled 2016-11-23: qty 100

## 2016-11-23 MED ORDER — ONDANSETRON HCL 4 MG/2ML IJ SOLN
4.0000 mg | Freq: Once | INTRAMUSCULAR | Status: AC
  Administered 2016-11-23: 4 mg via INTRAVENOUS
  Filled 2016-11-23: qty 2

## 2016-11-23 MED ORDER — FENTANYL CITRATE (PF) 100 MCG/2ML IJ SOLN
INTRAMUSCULAR | Status: AC
  Filled 2016-11-23: qty 2

## 2016-11-23 MED ORDER — LACTATED RINGERS IV SOLN
INTRAVENOUS | Status: DC | PRN
  Administered 2016-11-23: 22:00:00 via INTRAVENOUS

## 2016-11-23 MED ORDER — FENTANYL CITRATE (PF) 100 MCG/2ML IJ SOLN: 50.0000 ug | Freq: Once | INTRAMUSCULAR | Status: DC

## 2016-11-23 MED ORDER — ONDANSETRON HCL 4 MG/2ML IJ SOLN
INTRAMUSCULAR | Status: AC
  Filled 2016-11-23: qty 2

## 2016-11-23 MED ORDER — DEXAMETHASONE SODIUM PHOSPHATE 10 MG/ML IJ SOLN: 8.0000 mg | Freq: Once | INTRAMUSCULAR | Status: DC

## 2016-11-23 MED ORDER — DEXAMETHASONE SODIUM PHOSPHATE 10 MG/ML IJ SOLN
INTRAMUSCULAR | Status: DC | PRN
  Administered 2016-11-23: 10 mg via INTRAVENOUS

## 2016-11-23 MED ORDER — FENTANYL CITRATE (PF) 100 MCG/2ML IJ SOLN
INTRAMUSCULAR | Status: DC | PRN
  Administered 2016-11-23 (×2): 50 ug via INTRAVENOUS

## 2016-11-23 MED ORDER — LIDOCAINE HCL (CARDIAC) 20 MG/ML IV SOLN
INTRAVENOUS | Status: DC | PRN
  Administered 2016-11-23: 75 mg via INTRAVENOUS
  Administered 2016-11-23: 25 mg via INTRATRACHEAL

## 2016-11-23 MED ORDER — ENOXAPARIN SODIUM 40 MG/0.4ML ~~LOC~~ SOLN
40.0000 mg | SUBCUTANEOUS | Status: DC
  Filled 2016-11-23: qty 0.4

## 2016-11-23 MED ORDER — CEFAZOLIN SODIUM-DEXTROSE 2-3 GM-% IV SOLR
INTRAVENOUS | Status: DC | PRN
  Administered 2016-11-23: 2 g via INTRAVENOUS

## 2016-11-23 MED ORDER — METHOCARBAMOL 500 MG PO TABS
500.0000 mg | ORAL_TABLET | Freq: Four times a day (QID) | ORAL | Status: DC | PRN
  Administered 2016-11-24 – 2016-11-26 (×4): 500 mg via ORAL
  Filled 2016-11-23 (×5): qty 1

## 2016-11-23 MED ORDER — PROPOFOL 10 MG/ML IV BOLUS
INTRAVENOUS | Status: DC | PRN
  Administered 2016-11-23: 30 mg via INTRAVENOUS
  Administered 2016-11-23: 90 mg via INTRAVENOUS
  Administered 2016-11-23: 30 mg via INTRAVENOUS
  Administered 2016-11-23: 20 mg via INTRAVENOUS
  Administered 2016-11-23: 30 mg via INTRAVENOUS

## 2016-11-23 MED ORDER — GLYCOPYRROLATE 0.2 MG/ML IJ SOLN
INTRAMUSCULAR | Status: DC | PRN
  Administered 2016-11-23: 0.2 mg via INTRAVENOUS

## 2016-11-23 MED ORDER — LOTEPREDNOL ETABONATE 0.5 % OP SUSP
1.0000 [drp] | Freq: Every day | OPHTHALMIC | Status: DC
  Administered 2016-11-23 – 2016-11-26 (×3): 1 [drp] via OPHTHALMIC
  Filled 2016-11-23: qty 5

## 2016-11-23 MED ORDER — ACETAMINOPHEN 10 MG/ML IV SOLN
INTRAVENOUS | Status: AC
  Filled 2016-11-23: qty 100

## 2016-11-23 MED ORDER — ASPIRIN EC 81 MG PO TBEC: 81.0000 mg | DELAYED_RELEASE_TABLET | Freq: Every day | ORAL | Status: DC

## 2016-11-23 MED ORDER — LISINOPRIL 10 MG PO TABS: 10.0000 mg | ORAL_TABLET | Freq: Every day | ORAL | Status: DC

## 2016-11-23 MED ORDER — HYDRALAZINE HCL 25 MG PO TABS
25.0000 mg | ORAL_TABLET | Freq: Three times a day (TID) | ORAL | Status: DC
  Administered 2016-11-23 – 2016-11-24 (×3): 25 mg via ORAL
  Filled 2016-11-23 (×4): qty 1

## 2016-11-23 MED ORDER — POLYETHYLENE GLYCOL 3350 17 G PO PACK: 17.0000 g | PACK | Freq: Two times a day (BID) | ORAL | Status: DC

## 2016-11-23 MED ORDER — HYDRALAZINE HCL 20 MG/ML IJ SOLN
10.0000 mg | INTRAMUSCULAR | Status: DC | PRN
Start: 2016-11-23 — End: 2016-11-26

## 2016-11-23 MED ORDER — PROPOFOL 10 MG/ML IV BOLUS
INTRAVENOUS | Status: AC
  Filled 2016-11-23: qty 20

## 2016-11-23 MED ORDER — HYDROCODONE-ACETAMINOPHEN 5-325 MG PO TABS
1.0000 | ORAL_TABLET | Freq: Four times a day (QID) | ORAL | Status: DC | PRN
  Administered 2016-11-23 – 2016-11-26 (×7): 1 via ORAL
  Filled 2016-11-23 (×7): qty 1

## 2016-11-23 MED ORDER — POVIDONE-IODINE 10 % EX SWAB
2.0000 "application " | Freq: Once | CUTANEOUS | Status: AC
  Administered 2016-11-23: 2 via TOPICAL

## 2016-11-23 MED ORDER — LACTATED RINGERS IV SOLN
INTRAVENOUS | Status: DC | PRN
  Administered 2016-11-23 (×3): via INTRAVENOUS

## 2016-11-23 MED ORDER — ONDANSETRON HCL 4 MG/2ML IJ SOLN
INTRAMUSCULAR | Status: DC | PRN
  Administered 2016-11-23: 4 mg via INTRAVENOUS

## 2016-11-23 MED ORDER — POLYETHYLENE GLYCOL 3350 17 G PO PACK
17.0000 g | PACK | Freq: Two times a day (BID) | ORAL | Status: DC
  Administered 2016-11-24 – 2016-11-25 (×3): 17 g via ORAL
  Filled 2016-11-23 (×3): qty 1

## 2016-11-23 MED ORDER — 0.9 % SODIUM CHLORIDE (POUR BTL) OPTIME
TOPICAL | Status: DC | PRN
  Administered 2016-11-23: 1000 mL

## 2016-11-23 MED ORDER — SERTRALINE HCL 25 MG PO TABS
25.0000 mg | ORAL_TABLET | Freq: Every day | ORAL | Status: DC
  Administered 2016-11-24 – 2016-11-26 (×3): 25 mg via ORAL
  Filled 2016-11-23 (×3): qty 1

## 2016-11-23 MED ORDER — DEXAMETHASONE SODIUM PHOSPHATE 10 MG/ML IJ SOLN
INTRAMUSCULAR | Status: AC
  Filled 2016-11-23: qty 1

## 2016-11-23 MED ORDER — SUCCINYLCHOLINE CHLORIDE 20 MG/ML IJ SOLN
INTRAMUSCULAR | Status: DC | PRN
  Administered 2016-11-23: 120 mg via INTRAVENOUS

## 2016-11-23 MED ORDER — FENTANYL CITRATE (PF) 100 MCG/2ML IJ SOLN
50.0000 ug | INTRAMUSCULAR | Status: DC | PRN
  Administered 2016-11-23 – 2016-11-25 (×3): 50 ug via INTRAVENOUS
  Filled 2016-11-23 (×3): qty 2

## 2016-11-23 MED ORDER — CEFAZOLIN SODIUM-DEXTROSE 2-4 GM/100ML-% IV SOLN: 2.0000 g | INTRAVENOUS | Status: DC

## 2016-11-23 MED ORDER — ACETAMINOPHEN 10 MG/ML IV SOLN
INTRAVENOUS | Status: DC | PRN
  Administered 2016-11-23: 1000 mg via INTRAVENOUS

## 2016-11-23 MED ORDER — SODIUM CHLORIDE 0.9 % IV SOLN
INTRAVENOUS | Status: DC
  Administered 2016-11-23 – 2016-11-24 (×4): via INTRAVENOUS

## 2016-11-23 MED ORDER — CHLORHEXIDINE GLUCONATE 4 % EX LIQD
60.0000 mL | Freq: Once | CUTANEOUS | Status: AC
  Administered 2016-11-23: 4 via TOPICAL
  Filled 2016-11-23: qty 60

## 2016-11-23 MED ORDER — PANTOPRAZOLE SODIUM 40 MG PO TBEC
40.0000 mg | DELAYED_RELEASE_TABLET | Freq: Every day | ORAL | Status: DC
  Administered 2016-11-24 – 2016-11-26 (×3): 40 mg via ORAL
  Filled 2016-11-23 (×3): qty 1

## 2016-11-23 MED ORDER — LIDOCAINE 2% (20 MG/ML) 5 ML SYRINGE
INTRAMUSCULAR | Status: AC
  Filled 2016-11-23: qty 5

## 2016-11-23 MED ORDER — FENTANYL CITRATE (PF) 100 MCG/2ML IJ SOLN: 25.0000 ug | INTRAMUSCULAR | Status: DC | PRN

## 2016-11-23 MED ORDER — FENTANYL CITRATE (PF) 100 MCG/2ML IJ SOLN
50.0000 ug | Freq: Once | INTRAMUSCULAR | Status: AC
  Administered 2016-11-23: 50 ug via INTRAVENOUS
  Filled 2016-11-23: qty 2

## 2016-11-23 MED ORDER — DEXTROSE 5 % IV SOLN
500.0000 mg | Freq: Four times a day (QID) | INTRAVENOUS | Status: DC | PRN
  Filled 2016-11-23 (×2): qty 5

## 2016-11-23 MED ORDER — SUCCINYLCHOLINE CHLORIDE 200 MG/10ML IV SOSY
PREFILLED_SYRINGE | INTRAVENOUS | Status: AC
  Filled 2016-11-23: qty 10

## 2016-11-23 SURGICAL SUPPLY — 36 items
BAG SPEC THK2 15X12 ZIP CLS (MISCELLANEOUS) ×1
BAG ZIPLOCK 12X15 (MISCELLANEOUS) ×3 IMPLANT
BIT DRILL CANN LG 4.3MM (BIT) IMPLANT
BNDG COHESIVE 6X5 TAN STRL LF (GAUZE/BANDAGES/DRESSINGS) ×3 IMPLANT
COVER BACK TABLE 60X90IN (DRAPES) ×3 IMPLANT
COVER PERINEAL POST (MISCELLANEOUS) ×3 IMPLANT
DRAPE STERI IOBAN 125X83 (DRAPES) ×3 IMPLANT
DRILL BIT CANN LG 4.3MM (BIT) ×3
DRSG MEPILEX BORDER 4X4 (GAUZE/BANDAGES/DRESSINGS) ×2 IMPLANT
DRSG MEPILEX BORDER 4X8 (GAUZE/BANDAGES/DRESSINGS) ×2 IMPLANT
DRSG PAD ABDOMINAL 8X10 ST (GAUZE/BANDAGES/DRESSINGS) ×3 IMPLANT
DURAPREP 26ML APPLICATOR (WOUND CARE) ×3 IMPLANT
ELECT REM PT RETURN 9FT ADLT (ELECTROSURGICAL) ×3
ELECTRODE REM PT RTRN 9FT ADLT (ELECTROSURGICAL) ×1 IMPLANT
GAUZE SPONGE 4X4 12PLY STRL (GAUZE/BANDAGES/DRESSINGS) ×3 IMPLANT
GLOVE BIO SURGEON STRL SZ7.5 (GLOVE) ×3 IMPLANT
GLOVE BIO SURGEON STRL SZ8 (GLOVE) ×6 IMPLANT
GLOVE BIOGEL PI IND STRL 8 (GLOVE) ×1 IMPLANT
GLOVE BIOGEL PI INDICATOR 8 (GLOVE) ×2
GOWN STRL REUS W/TWL LRG LVL3 (GOWN DISPOSABLE) ×3 IMPLANT
GOWN STRL REUS W/TWL XL LVL3 (GOWN DISPOSABLE) ×3 IMPLANT
GUIDEPIN 3.2X17.5 THRD DISP (PIN) ×4 IMPLANT
HIP FRAC NAIL LAG SCR 10.5X100 (Orthopedic Implant) ×2 IMPLANT
KIT BASIN OR (CUSTOM PROCEDURE TRAY) ×3 IMPLANT
MANIFOLD NEPTUNE II (INSTRUMENTS) ×3 IMPLANT
NAIL HIP FRACT 130D 9X180 (Orthopedic Implant) ×2 IMPLANT
NS IRRIG 1000ML POUR BTL (IV SOLUTION) ×3 IMPLANT
PACK GENERAL/GYN (CUSTOM PROCEDURE TRAY) ×3 IMPLANT
PADDING CAST COTTON 6X4 STRL (CAST SUPPLIES) ×6 IMPLANT
POSITIONER SURGICAL ARM (MISCELLANEOUS) ×3 IMPLANT
SCREW BONE CORTICAL 5.0X38 (Screw) ×2 IMPLANT
SCREW CANN THRD AFF 10.5X100 (Orthopedic Implant) IMPLANT
SUT VIC AB 0 CT1 27 (SUTURE) ×3
SUT VIC AB 0 CT1 27XBRD ANTBC (SUTURE) ×1 IMPLANT
TOWEL OR 17X26 10 PK STRL BLUE (TOWEL DISPOSABLE) ×6 IMPLANT
TRAY FOLEY W/METER SILVER 16FR (SET/KITS/TRAYS/PACK) IMPLANT

## 2016-11-23 NOTE — Anesthesia Postprocedure Evaluation (Signed)
Anesthesia Post Note  Patient: Erika Day  Procedure(s) Performed: Procedure(s) (LRB): INTRAMEDULLARY (IM) NAIL FEMORAL (Left)  Patient location during evaluation: PACU Anesthesia Type: General Level of consciousness: awake Pain management: pain level controlled Vital Signs Assessment: post-procedure vital signs reviewed and stable Respiratory status: spontaneous breathing Cardiovascular status: stable Anesthetic complications: no       Last Vitals:  Vitals:   11/23/16 1330 11/23/16 2200  BP: (!) 152/54 125/64  Pulse: 62   Resp: 16   Temp: 36.7 C     Last Pain:  Vitals:   11/23/16 1330  TempSrc: Oral  PainSc:                  Sirr Kabel

## 2016-11-23 NOTE — H&P (View-Only) (Signed)
Reason for Consult:Left femur fracture Referring Physician: Dr. [name] is an 83 y.o. female.  HPI: Erika Day is an 83 yo female who had a mechanical fall at home last night. She was trying to sit in her wheelchair and the wheels were not locked, so she fell on her left side with immediate left hip pain and inability to get up. SHe did not have any prodromal symptoms leading to this fall. She did not have a head injury or LOC from the fall. Only complaint this morning is left hip pain  Past Medical History:  Diagnosis Date  . Acid reflux disease   . Cancer [facility])    Looking for documentation of this  . CLL (chronic lymphocytic leukemia) ([place]) 05/15/2016  . DDD (degenerative disc disease), lumbar   . Degenerative joint disease   . Diabetes mellitus without complication ([place])   . Dyslipidemia   . Hypertension   . 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 Panic attacks   . Pneumonia 02/2016  . 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 Stroke [facility]) age 53 yo   poorly controlled DM and Hypertension    Past Surgical History:  Procedure Laterality Date  . ABDOMINAL HYSTERECTOMY  ? 08/25/2011 ?   Has BSO for left benign ovarian tumor with torsion and broad ligament benign tumor, but does not appear had hysterectomy--[facility], NOT ovarian cancer  . CHOLECYSTECTOMY    . FLEXIBLE SIGMOIDOSCOPY N/A 05/19/2016   Procedure: FLEXIBLE SIGMOIDOSCOPY;  Surgeon: [name], MD;  [facility];  Service: Endoscopy;  Laterality: N/A;  . FLEXIBLE SIGMOIDOSCOPY N/A 06/28/2016   Procedure: FLEXIBLE SIGMOIDOSCOPY;  Surgeon: Ladene Artist, MD;  [facility];  Service: Endoscopy;  Laterality: N/A;  . LAPAROSCOPIC SIGMOID COLECTOMY N/A 06/30/2016   Procedure: LAPAROSCOPIC SIGMOID COLECTOMY,rigid sigmoidoscopy, repair recurrent incisional hernia;  Surgeon: Michael Boston, MD;  [facility];  Service: General;  Laterality: N/A;  . LAPAROTOMY  N/A 04/29/2014   Procedure: EXPLORATORY LAPAROTOMY;  Surgeon: Imogene Burn. Georgette Dover, MD;  Location: [place];  Service: General;  Laterality: N/A;  . LYSIS OF ADHESION N/A 04/29/2014   Procedure: EXTENSIVE LYSIS OF ADHESIONS;  Surgeon: Imogene Burn. Georgette [place], MD;  Location: [place];  Service: General;  Laterality: N/A;  . VENTRAL HERNIA REPAIR N/A 04/29/2014   Procedure: PRIMARY REPAIR OF VENTRAL HERNIA;  Surgeon: Imogene Burn. Georgette Dover, MD;  Location: [facility];  Service: General;  Laterality: N/A;    Family History  Problem Relation Age of Onset  . Hypertension Mother   . Stroke Mother   . Arthritis Mother   . Alcohol abuse Father     Social History:  reports that she quit smoking about 26 years ago. Her smoking use included Cigarettes. She quit smokeless tobacco use about 26 years ago. Her smokeless tobacco use included Snuff. She reports that she does not drink alcohol or use drugs.  Allergies:  Allergies  Allergen Reactions  . Morphine And Related Other (See Comments)    Blood sugar dropped one-time    Medications: I have reviewed the patient's current medications.  Results for orders placed or performed during the hospital encounter of 11/22/16 (from the past 48 hour(s))  CBC with Differential/Platelet     Status: Abnormal   Collection Time: 11/23/16 12:30 AM  Result Value Ref Range   WBC 4.3 4.0 - 10.5 K/uL   RBC 3.83 (L) 3.87 - 5.11 MIL/uL   Hemoglobin 10.2 (  L) 12.0 - 15.0 g/dL   HCT 31.6 (L) 36.0 - 46.0 %   MCV 82.5 78.0 - 100.0 fL   MCH 26.6 26.0 - 34.0 pg   MCHC 32.3 30.0 - 36.0 g/dL   RDW 14.9 11.5 - 15.5 %   Platelets 138 (L) 150 - 400 K/uL   Neutrophils Relative % 66 %   Neutro Abs 2.8 1.7 - 7.7 K/uL   Lymphocytes Relative 25 %   Lymphs Abs 1.0 0.7 - 4.0 K/uL   Monocytes Relative 7 %   Monocytes Absolute 0.3 0.1 - 1.0 K/uL   Eosinophils Relative 2 %   Eosinophils Absolute 0.1 0.0 - 0.7 K/uL   Basophils Relative 0 %   Basophils Absolute 0.0 0.0 - 0.1 K/uL  I-Stat Chem 8, ED      Status: Abnormal   Collection Time: 11/23/16  1:25 AM  Result Value Ref Range   Sodium 139 135 - 145 mmol/L   Potassium 4.7 3.5 - 5.1 mmol/L   Chloride 102 101 - 111 mmol/L   BUN 10 6 - 20 mg/dL   Creatinine, Ser 0.80 0.44 - 1.00 mg/dL   Glucose, Bld 130 (H) 65 - 99 mg/dL   Calcium, Ion 1.29 1.15 - 1.40 mmol/L   TCO2 26 0 - 100 mmol/L   Hemoglobin 11.2 (L) 12.0 - 15.0 g/dL   HCT 33.0 (L) 36.0 - 46.0 %    Dg Chest 2 View  Result Date: 11/23/2016 CLINICAL DATA:  83 y/o  F; status post fall. EXAM: CHEST  2 VIEW COMPARISON:  None. FINDINGS: Elevated right hemidiaphragm with colonic interposition over the liver. Linear opacities in lung bases is likely atelectasis. No focal consolidation identified. No pneumothorax. No pleural effusion. Aortic atherosclerosis with calcification. Multilevel degenerative changes of the spine. Stable cardiac silhouette. IMPRESSION: Stable elevated right hemidiaphragm and streaky bibasilar opacities probably representing atelectasis. Aortic atherosclerosis. Electronically Signed   By: Kristine Garbe M.D.   On: 11/23/2016 02:25   Dg Hip Unilat W Or Wo Pelvis 2-3 Views Left  Result Date: 11/23/2016 CLINICAL DATA:  Pain after fall. EXAM: DG HIP (WITH OR WITHOUT PELVIS) 2-3V LEFT COMPARISON:  Abdomen pelvic CT from 06/27/2016 FINDINGS: Nondisplaced acute intertrochanteric fracture of the left femur with avulsed lesser trochanter. The native right hip is maintained. There is osteoarthritic sclerosis about both SI joints with degenerative disc disease of L4-5 and L5-S1. The bony pelvis appears intact. IMPRESSION: Acute left intertrochanteric fracture of the left femur with avulsed lesser trochanter. Degenerative disc disease L4 through S1. 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 No acute pelvic fracture. Electronically Signed   By: Ashley Royalty M.D.   On: 11/23/2016 02:25    ROS Blood pressure (!) 181/61, pulse 60, temperature 98 F (36.7 C), temperature  source Oral, resp. rate 16, height 5\' 5"  (1.651 m), weight 66.6 kg (146 lb 13.2 oz), SpO2 99 %. Physical Exam Physical Examination: General appearance - alert, well appearing, and in no distress Mental status - alert, oriented to person, place, and time LLE short and externally rotated.able to wiggle toes and move foot; sensation intact, pulses intact; knee non-tender; tender over lateral hip and has discomfort with any attempted hip motion  Assessment/Plan: Left intertrochanteric femur fracture- Will require operative fixation for pain relief and to allow for ambulation. Will perform intramedullary nailing. Discussed in detail with patient who elects to proceed  Gearlean Alf 11/23/2016, 6:56 AM

## 2016-11-23 NOTE — Anesthesia Preprocedure Evaluation (Addendum)
Anesthesia Evaluation  Patient identified by MRN, date of birth, ID band Patient awake    Reviewed: Allergy & Precautions, NPO status , Patient's Chart, lab work & pertinent test results  Airway Mallampati: II  TM Distance: >3 FB     Dental  (+) Poor Dentition, Loose, Dental Advisory Given Very loose :wiggly"teeth. Discussed issue in detail with patient and her daughter. CG:   Pulmonary pneumonia, former smoker,    breath sounds clear to auscultation       Cardiovascular hypertension,  Rhythm:Regular Rate:Normal     Neuro/Psych CVA    GI/Hepatic Neg liver ROS, GERD  ,  Endo/Other  diabetes  Renal/GU negative Renal ROS     Musculoskeletal  (+) Arthritis ,   Abdominal   Peds  Hematology  (+) anemia ,   Anesthesia Other Findings   Reproductive/Obstetrics                             Anesthesia Physical Anesthesia Plan  ASA: III  Anesthesia Plan: General   Post-op Pain Management:    Induction: Intravenous  Airway Management Planned: Oral ETT  Additional Equipment:   Intra-op Plan:   Post-operative Plan: Possible Post-op intubation/ventilation  Informed Consent: I have reviewed the patients History and Physical, chart, labs and discussed the procedure including the risks, benefits and alternatives for the proposed anesthesia with the patient or authorized representative who has indicated his/her understanding and acceptance.   Dental advisory given  Plan Discussed with: Anesthesiologist and CRNA  Anesthesia Plan Comments:         Anesthesia Quick Evaluation

## 2016-11-23 NOTE — Progress Notes (Signed)
PROGRESS NOTE                                                                                                                                                                                                             Patient Demographics:    Erika Day, is a 83 y.o. female, DOB - 05/02/1930, [id number]  Admit date - 11/22/2016   Admitting Physician Norval Morton, MD  Outpatient Primary MD for the patient is Philis Fendt, MD  LOS - 0  Outpatient Specialists: oncology Dr Alvy Bimler  Chief Complaint  Patient presents with  . Hip Pain       Brief Narrative  83 y.o. 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 Patient fell out of wheelchair. X-rays revealed acute left hip fracture. - Hip fracture protocol initiated - Fentanyl when necessary pain - Gentle IV fluid hydration  - seen by Dr. Synthia Innocent of orthopedics plan for surgical repair today - Treated with when necessary pain medication  Anemia: Hemoglobin 10.2 which appears better than the patient's baseline of 8-9.  - Continue to monitor  Thrombocytopenia: Stable. Platelet count noted to be 138 on admission. This appears near the patient's baseline. - Continue to monitor   History of lymphoma: Patient followed by Dr.Goursch currently on chemotherapy last treatment 11/10.  Hypertension - Continue with home medication  Code Status : Full  code  Family Communication  : Daughter at bedside  Disposition Plan  : pending PT post op.  Consults  :  Ortho  Procedures  : None  DVT Prophylaxis  :  Lovenox - SCDs   Lab Results  Component Value Date   PLT 134 (L) 11/23/2016    Antibiotics  :   Anti-infectives  Start     Dose/Rate Route Frequency Ordered Stop   11/23/16 1800  ceFAZolin (ANCEF) IVPB 2g/100 mL premix     2 g 200 mL/hr over 30 Minutes Intravenous On call to O.R. 11/23/16 0748 11/24/16 0559        Objective:   Vitals:   11/23/16 0500 11/23/16 0543 11/23/16 0934 11/23/16 1330  BP: 160/83 (!) 181/61 (!) 157/68 (!) 152/54  Pulse: 63 60 (!) 59 62  Resp: 16 16 18 16   Temp:  98 F (36.7 C) 98.3 F (36.8 C) 98 F (36.7 C)  TempSrc:  Oral Oral Oral  SpO2: 95% 99% 99% 100%  Weight:  66.6 kg (146 lb 13.2 oz)    Height:  5\' 5"  (1.651 m)      Wt Readings from Last 3 Encounters:  11/23/16 66.6 kg (146 lb 13.2 oz)  10/13/16 61.9 kg (136 lb 8 oz)  09/19/16 62 kg (136 lb 9.6 oz)    No intake or output data in the 24 hours ending 11/23/16 1357   Physical Exam  Awake Alert, frail, Supple Neck,No JVD, Symmetrical Chest wall movement, Good air movement bilaterally, CTAB RRR,No Gallops,Rubs or new Murmurs, No Parasternal Heave +ve B.Sounds, Abd Soft, No tenderness,  No rebound - guarding or rigidity. No Cyanosis, Clubbing or edema, No new Rash or bruise      Data Review:    CBC  Recent Labs Lab 11/23/16 0030 11/23/16 0125 11/23/16 0629  WBC 4.3  --  3.8*  HGB 10.2* 11.2* 9.2*  HCT 31.6* 33.0* 28.1*  PLT 138*  --  134*  MCV 82.5  --  82.9  MCH 26.6  --  27.1  MCHC 32.3  --  32.7  RDW 14.9  --  15.1  LYMPHSABS 1.0  --   --   MONOABS 0.3  --   --   EOSABS 0.1  --   --   BASOSABS 0.0  --   --     Chemistries   Recent Labs Lab 11/23/16 0125 11/23/16 0629  NA 139 136  K 4.7 4.1  CL 102 103  CO2  --  26  GLUCOSE 130* 157*  BUN 10 12  CREATININE 0.80 0.73  CALCIUM  --  9.3    ------------------------------------------------------------------------------------------------------------------ No results for input(s): CHOL, HDL, LDLCALC, TRIG, CHOLHDL, LDLDIRECT in the last 72 hours.  Lab Results  Component Value Date   HGBA1C 5.4 06/29/2016   ------------------------------------------------------------------------------------------------------------------ No results for input(s): TSH, T4TOTAL, T3FREE, THYROIDAB in the last 72 hours.  Invalid input(s): FREET3 ------------------------------------------------------------------------------------------------------------------ No results for input(s): VITAMINB12, FOLATE, FERRITIN, TIBC, IRON, RETICCTPCT in the last 72 hours.  Coagulation profile No results for input(s): INR, PROTIME in the last 168 hours.  No results for input(s): DDIMER in the last 72 hours.  Cardiac Enzymes No results for input(s): CKMB, TROPONINI, MYOGLOBIN in the last 168 hours.  Invalid input(s): CK ------------------------------------------------------------------------------------------------------------------    Component Value Date/Time   BNP 443.6 (H) 02/13/2016 0350    Inpatient Medications  Scheduled Meds: . aspirin EC  81 mg Oral Daily  .  ceFAZolin (ANCEF) IV  2 g Intravenous On Call to OR  . dexamethasone  8 mg Intravenous Once  . enoxaparin (LOVENOX) injection  40 mg Subcutaneous Q24H  . hydrALAZINE  25 mg Oral Q8H  . lisinopril  10 mg Oral Daily  . loteprednol  1 drop Right Eye Daily  . pantoprazole  40 mg Oral Daily  . polyethylene glycol  17  g Oral BID  . sertraline  25 mg Oral Daily   Continuous Infusions: . sodium chloride 75 mL/hr at 11/23/16 1342   PRN Meds:.fentaNYL (SUBLIMAZE) injection, hydrALAZINE, HYDROcodone-acetaminophen, methocarbamol **OR** methocarbamol (ROBAXIN)  IV  Micro Results Recent Results (from the past 240 hour(s))  Surgical PCR screen     Status: None   Collection Time: 11/23/16  7:40  AM  Result Value Ref Range Status   MRSA, PCR NEGATIVE NEGATIVE Final   Staphylococcus aureus NEGATIVE NEGATIVE Final    Comment:        The Xpert SA Assay (FDA approved for NASAL specimens in patients over 57 years of age), is one component of a comprehensive surveillance program.  Test performance has been validated by [facility] for patients greater than or equal to 61 year old. It is not intended to diagnose infection nor to guide or monitor treatment.     Radiology Reports Dg Chest 2 View  Result Date: 11/23/2016 CLINICAL DATA:  83 y/o  F; status post fall. EXAM: CHEST  2 VIEW COMPARISON:  None. FINDINGS: Elevated right hemidiaphragm with colonic interposition over the liver. Linear opacities in lung bases is likely atelectasis. No focal consolidation identified. No pneumothorax. No pleural effusion. Aortic atherosclerosis with calcification. Multilevel degenerative changes of the spine. Stable cardiac silhouette. IMPRESSION: Stable elevated right hemidiaphragm and streaky bibasilar opacities probably representing atelectasis. Aortic atherosclerosis. Electronically Signed   By: Kristine Garbe M.D.   On: 11/23/2016 02:25   Nm Pet Image Restag (ps) Skull Base To Thigh  Result Date: 11/13/2016 CLINICAL DATA:  Subsequent treatment strategy for lymphoma. Low-grade non-Hodgkin's B-cell lymphoma. Surgery for abdominal is obstruction demonstrated lymphoma involvement of the GI tract. EXAM: NUCLEAR MEDICINE PET SKULL BASE TO THIGH TECHNIQUE: 6.0 mCi F-18 FDG was injected intravenously. Full-ring PET imaging was performed from the skull base to thigh after the radiotracer. CT data was obtained and used for attenuation correction and anatomic localization. FASTING BLOOD GLUCOSE:  Value: 75 mg/dl COMPARISON:  PET-CT 05/25/2016 FINDINGS: NECK No hypermetabolic lymph nodes in the neck. Asymmetric hypermetabolic muscle activity in the RIGHT neck is favored physiologic. Uptake in LEFT and  RIGHT lobe of thyroid gland (greater on the LEFT) similar to comparison exam. CHEST Persistent hypermetabolic precarinal lymph node with SUV max equal 4.2 compared to 3.9. Lymph node unchanged in size at 1.5 cm. RIGHT axial lymph node measuring 1.0 cm with SUV max equal 1.8 decreased from 2.6. 8 mm LEFT axial lymph node is decreased in metabolic activity and size from 11 mm on comparison exam with no significant activity. ABDOMEN/PELVIS No abnormal hypermetabolic activity within the liver, pancreas, adrenal glands, or spleen. No abnormal activity associated with the colon or small bowel. Atherosclerotic calcification of the aorta. Mild activity in the RIGHT inguinal lymph node with SUV max equal 2.6 compares to 2.9 on prior. No change in size. SKELETON No focal hypermetabolic activity to suggest skeletal metastasis. IMPRESSION: 1. No evidence disease progression. 2. Mild decrease in metabolic activity in axillary lymph nodes. 3. Similar metabolic activity of small mediastinal lymph nodes [place] 3 . 4. Similar activity in inguinal lymph nodes. 5. No abnormal bowel activity identified. Electronically Signed   By: Suzy Bouchard M.D.   On: 11/13/2016 10:36   Dg Hip Unilat W Or Wo Pelvis 2-3 Views Left  Result Date: 11/23/2016 CLINICAL DATA:  Pain after fall. EXAM: DG HIP (WITH OR WITHOUT PELVIS) 2-3V LEFT COMPARISON:  Abdomen pelvic CT from 06/27/2016 FINDINGS: Nondisplaced acute intertrochanteric fracture  of the left femur with avulsed lesser trochanter. The native right hip is maintained. There is osteoarthritic sclerosis about both SI joints with degenerative disc disease of L4-5 and L5-S1. The bony pelvis appears intact. IMPRESSION: Acute left intertrochanteric fracture of the left femur with avulsed lesser trochanter. Degenerative disc disease L4 through S1. 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 No acute pelvic fracture. Electronically Signed   By: Ashley Royalty M.D.   On: 11/23/2016 02:25     Kairos Panetta M.D on 11/23/2016 at 1:57 PM  Between 7am to 7pm - Pager - [phone]  After 7pm go to [email or web address] - password [facility]  [facility] -  Office  [phone]

## 2016-11-23 NOTE — Consult Note (Signed)
Reason for Consult:Left femur fracture Referring Physician: Dr. Helene Kelp is an 83 y.o. female.  HPI: Erika Day is an 83 yo female who had a mechanical fall at home last night. She was trying to sit in her wheelchair and the wheels were not locked, so she fell on her left side with immediate left hip pain and inability to get up. SHe did not have any prodromal symptoms leading to this fall. She did not have a head injury or LOC from the fall. Only complaint this morning is left hip pain  Past Medical History:  Diagnosis Date  . Acid reflux disease   . Cancer [facility])    Looking for documentation of this  . CLL (chronic lymphocytic leukemia) ([place]) 05/15/2016  . DDD (degenerative disc disease), lumbar   . Degenerative joint disease   . Diabetes mellitus without complication ([place])   . Dyslipidemia   . Hypertension   . 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 Panic attacks   . Pneumonia 02/2016  . 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 Stroke [facility]) age 75 yo   poorly controlled DM and Hypertension    Past Surgical History:  Procedure Laterality Date  . ABDOMINAL HYSTERECTOMY  ? 08/25/2011 ?   Has BSO for left benign ovarian tumor with torsion and broad ligament benign tumor, but does not appear had hysterectomy--[facility], NOT ovarian cancer  . CHOLECYSTECTOMY    . FLEXIBLE SIGMOIDOSCOPY N/A 05/19/2016   Procedure: FLEXIBLE SIGMOIDOSCOPY;  Surgeon: [name], MD;  Location: [facility];  Service: Endoscopy;  Laterality: N/A;  . FLEXIBLE SIGMOIDOSCOPY N/A 06/28/2016   Procedure: FLEXIBLE SIGMOIDOSCOPY;  Surgeon: Ladene Artist, MD;  Location: [facility];  Service: Endoscopy;  Laterality: N/A;  . LAPAROSCOPIC SIGMOID COLECTOMY N/A 06/30/2016   Procedure: LAPAROSCOPIC SIGMOID COLECTOMY,rigid sigmoidoscopy, repair recurrent incisional hernia;  Surgeon: Michael Boston, MD;  Location: [facility];  Service: General;  Laterality: N/A;  . LAPAROTOMY  N/A 04/29/2014   Procedure: EXPLORATORY LAPAROTOMY;  Surgeon: Imogene Burn. Georgette Dover, MD;  Location: [place];  Service: General;  Laterality: N/A;  . LYSIS OF ADHESION N/A 04/29/2014   Procedure: EXTENSIVE LYSIS OF ADHESIONS;  Surgeon: Imogene Burn. Georgette Dover, MD;  Location: [place];  Service: General;  Laterality: N/A;  . VENTRAL HERNIA REPAIR N/A 04/29/2014   Procedure: PRIMARY REPAIR OF VENTRAL HERNIA;  Surgeon: Imogene Burn. Georgette Dover, MD;  Location: [place] OR;  Service: General;  Laterality: N/A;    Family History  Problem Relation Age of Onset  . Hypertension Mother   . Stroke Mother   . Arthritis Mother   . Alcohol abuse Father     Social History:  reports that she quit smoking about 26 years ago. Her smoking use included Cigarettes. She quit smokeless tobacco use about 26 years ago. Her smokeless tobacco use included Snuff. She reports that she does not drink alcohol or use drugs.  Allergies:  Allergies  Allergen Reactions  . Morphine And Related Other (See Comments)    Blood sugar dropped one-time    Medications: I have reviewed the patient's current medications.  Results for orders placed or performed during the hospital encounter of 11/22/16 (from the past 48 hour(s))  CBC with Differential/Platelet     Status: Abnormal   Collection Time: 11/23/16 12:30 AM  Result Value Ref Range   WBC 4.3 4.0 - 10.5 K/uL   RBC 3.83 (L) 3.87 - 5.11 MIL/uL   Hemoglobin 10.2 (  L) 12.0 - 15.0 g/dL   HCT 31.6 (L) 36.0 - 46.0 %   MCV 82.5 78.0 - 100.0 fL   MCH 26.6 26.0 - 34.0 pg   MCHC 32.3 30.0 - 36.0 g/dL   RDW 14.9 11.5 - 15.5 %   Platelets 138 (L) 150 - 400 K/uL   Neutrophils Relative % 66 %   Neutro Abs 2.8 1.7 - 7.7 K/uL   Lymphocytes Relative 25 %   Lymphs Abs 1.0 0.7 - 4.0 K/uL   Monocytes Relative 7 %   Monocytes Absolute 0.3 0.1 - 1.0 K/uL   Eosinophils Relative 2 %   Eosinophils Absolute 0.1 0.0 - 0.7 K/uL   Basophils Relative 0 %   Basophils Absolute 0.0 0.0 - 0.1 K/uL  I-Stat Chem 8, ED      Status: Abnormal   Collection Time: 11/23/16  1:25 AM  Result Value Ref Range   Sodium 139 135 - 145 mmol/L   Potassium 4.7 3.5 - 5.1 mmol/L   Chloride 102 101 - 111 mmol/L   BUN 10 6 - 20 mg/dL   Creatinine, Ser 0.80 0.44 - 1.00 mg/dL   Glucose, Bld 130 (H) 65 - 99 mg/dL   Calcium, Ion 1.29 1.15 - 1.40 mmol/L   TCO2 26 0 - 100 mmol/L   Hemoglobin 11.2 (L) 12.0 - 15.0 g/dL   HCT 33.0 (L) 36.0 - 46.0 %    Dg Chest 2 View  Result Date: 11/23/2016 CLINICAL DATA:  83 y/o  F; status post fall. EXAM: CHEST  2 VIEW COMPARISON:  None. FINDINGS: Elevated right hemidiaphragm with colonic interposition over the liver. Linear opacities in lung bases is likely atelectasis. No focal consolidation identified. No pneumothorax. No pleural effusion. Aortic atherosclerosis with calcification. Multilevel degenerative changes of the spine. Stable cardiac silhouette. IMPRESSION: Stable elevated right hemidiaphragm and streaky bibasilar opacities probably representing atelectasis. Aortic atherosclerosis. Electronically Signed   By: Kristine Garbe M.D.   On: 11/23/2016 02:25   Dg Hip Unilat W Or Wo Pelvis 2-3 Views Left  Result Date: 11/23/2016 CLINICAL DATA:  Pain after fall. EXAM: DG HIP (WITH OR WITHOUT PELVIS) 2-3V LEFT COMPARISON:  Abdomen pelvic CT from 06/27/2016 FINDINGS: Nondisplaced acute intertrochanteric fracture of the left femur with avulsed lesser trochanter. The native right hip is maintained. There is osteoarthritic sclerosis about both SI joints with degenerative disc disease of L4-5 and L5-S1. The bony pelvis appears intact. IMPRESSION: Acute left intertrochanteric fracture of the left femur with avulsed lesser trochanter. Degenerative disc disease L4 through S1. 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 No acute pelvic fracture. Electronically Signed   By: Ashley Royalty M.D.   On: 11/23/2016 02:25    ROS Blood pressure (!) 181/61, pulse 60, temperature 98 F (36.7 C), temperature  source Oral, resp. rate 16, height 5\' 5"  (1.651 m), weight 66.6 kg (146 lb 13.2 oz), SpO2 99 %. Physical Exam Physical Examination: General appearance - alert, well appearing, and in no distress Mental status - alert, oriented to person, place, and time LLE short and externally rotated.able to wiggle toes and move foot; sensation intact, pulses intact; knee non-tender; tender over lateral hip and has discomfort with any attempted hip motion  Assessment/Plan: Left intertrochanteric femur fracture- Will require operative fixation for pain relief and to allow for ambulation. Will perform intramedullary nailing. Discussed in detail with patient who elects to proceed  Gearlean Alf 11/23/2016, 6:56 AM

## 2016-11-23 NOTE — ED Notes (Signed)
Sheran Luz to draw labs

## 2016-11-23 NOTE — Op Note (Signed)
  OPERATIVE REPORT   PREOPERATIVE DIAGNOSIS: Left intertrochanteric femur fracture.   POSTOP DIAGNOSIS: Left intertrochanteric femur fracture.   PROCEDURE: Intramedullary nailing, Left intertrochanteric femur  fracture.   SURGEON: Gaynelle Arabian, M.D.   ASSISTANT: Arlee Muslim, PA-C  ANESTHESIA:General  Estimated BLOOD LOSS: 100 ml  DRAINS: None.   COMPLICATIONS:   None  CONDITION: -PACU - hemodynamically stable.    CLINICAL NOTE: Erika Day is an 83 y.o. female, who had a fall yesterday   sustaining a displaced  Left intertrochanteric femur fracture. They have been cleared medically and present for operative fixation   PROCEDURE IN DETAIL: After successful administration of  General,  the patient was placed on the fracture table with Left lower extremity in a well-padded traction boot,  Right lower extremity in a well-padded leg holder. Under fluoroscopic guidance, the fracture wasreduced. The traction was locked in this position. Thigh was prepped  and draped in the usual sterile fashion. The guide pin for the Biomet  Affixus was then passed percutaneously to the tip of the greater  trochanter, and then entered into the femoral canal. It was passed into the  canal. The small incision was made and the starter reamer passed over  the guide pin. This was then removed. The nail which was a 9  mm  diameter short trochanteric nail with 130 degrees angle was attached to  the external guide and then passed into the femoral canal, impacted to  the appropriate depth in the canal, then we used the external guide to  place the lag screw. Through the external guide, a guide pin was  passed. Small incision made, and the guide pin was in the center of the  femoral head on the AP and slightly center to posterior on the lateral.  Length was 100 mm. Triple reamer was passed over the guide pin. 100 mm  lag screw was placed. It was then locked down with a locking screw.  Through the external  guide, the distal interlock was placed through the  static hole and this was 38 mm in length with excellent bicortical  purchase. The external guide was then removed. Hardware was in good  position and fracture was well reduced. Wound was copiously irrigated with saline  solution, and  closed deep with interrupted 1 Vicryl, subcu  interrupted 2-0 Vicryl, subcuticular running 4-0 Monocryl. Incision was  cleaned and dried and sterile dressings applied. The patient was awakened and  transported to recovery in stable condition.   Dione Plover Shadd Dunstan, MD    11/23/2016, 9:12 PM

## 2016-11-23 NOTE — ED Notes (Addendum)
Daughter asks that the orthopedic surgeon call her to discuss detail/plans with her before anything is done.  [name]  - [phone]

## 2016-11-23 NOTE — Interval H&P Note (Signed)
History and Physical Interval Note:  11/23/2016 7:33 PM  Erika Day  has presented today for surgery, with the diagnosis of fractured left femur  The various methods of treatment have been discussed with the patient and family. After consideration of risks, benefits and other options for treatment, the patient has consented to  Procedure(s): INTRAMEDULLARY (IM) NAIL FEMORAL (Left) as a surgical intervention .  The patient's history has been reviewed, patient examined, no change in status, stable for surgery.  I have reviewed the patient's chart and labs.  Questions were answered to the patient's satisfaction.     Gearlean Alf

## 2016-11-23 NOTE — Progress Notes (Signed)
Initial Nutrition Assessment  DOCUMENTATION CODES:   Severe malnutrition in context of chronic illness  INTERVENTION:   -Diet advancement per MD -When diet advanced, provide Ensure Enlive po TID, each supplement provides 350 kcal and 20 grams of protein -Recommend ground meats with diet advancement. -RD to continue to monitor  NUTRITION DIAGNOSIS:   Malnutrition related to chronic illness as evidenced by severe depletion of body fat, severe depletion of muscle mass.  GOAL:   Patient will meet greater than or equal to 90% of their needs  MONITOR:   Diet advancement, Labs, Weight trends, I & O's  REASON FOR ASSESSMENT:   Malnutrition Screening Tool    ASSESSMENT:    83 yo female who had a mechanical fall at home last night. She was trying to sit in her wheelchair and the wheels were not locked, so she fell on her left side with immediate left hip pain and inability to get up. SHe did not have any prodromal symptoms leading to this fall. She did not have a head injury or LOC from the fall. Only complaint this morning is left hip pain  Patient in room with no family at bedside. Pt reports she was eating fine PTA. Currently NPO for pending surgery for femur fracture. Pt complete chemotherapy in November 2017. Denies swallowing issues. Pt states she uses a grinder at home to grind up meats. Recommend ground meats with diet advancement. Pt drinks ensure supplements at home, receives cases from [facility] when she can. RD to order. Pt states that she last weighed herself at home and she weighed 129-131 lb. Her weight for this admission is increased. Mostly likely d/t fluid. Nutrition-Focused physical exam completed. Findings are severe fat depletion, severe muscle depletion, and no edema.   Labs reviewed. Medications: IV Decadron once, IV Zofran once  Diet Order:  Diet NPO time specified Diet NPO time specified Except for: Sips with Meds  Skin:  Reviewed, no issues  Last BM:   PTA  Height:   Ht Readings from Last 1 Encounters:  11/23/16 5\' 5"  (1.651 m)    Weight:   Wt Readings from Last 1 Encounters:  11/23/16 146 lb 13.2 oz (66.6 kg)    Ideal Body Weight:  56.8 kg  BMI:  Body mass index is 24.43 kg/m.  Estimated Nutritional Needs:   Kcal:  1400-1600  Protein:  65-75g  Fluid:  1.6L/day  EDUCATION NEEDS:   No education needs identified at this time  Clayton Bibles, MS, RD, LDN Pager: [phone] After Hours Pager: [phone]

## 2016-11-23 NOTE — H&P (Signed)
History and Physical    AMIRACLE RZONCA [id number] DOB: 15-Oct-1930 DOA: 11/22/2016  Referring MD/NP/PA: Dr. Randal Buba PCP: Philis Fendt, MD  Patient coming from: Home via EMS  Chief Complaint:  HPI: Erika Day is a 83 y.o. female with medical history significant of CLL,  lymphoma, DM2, HTN, HLD, CVA; who presents after having fallen while trying to transfer from her wheelchair. Patient states that the brakes on the wheelchair did not lock and it slipped away from her causing her to fall. She reports immediate onset of left hip pain that was constant. Denies any loss of consciousness or head trauma. Patient reports that she was otherwise in her normal state of health. She reports last receiving chemotherapy for lymphoma back on 09/11/16 followed by Dr. Alvy Bimler.  ED Course: Upon admission into the emergency department patient was evaluated and seen to be afebrile with pulse otherwise within normal limits. X-ray imaging showed a left hip fracture. Dr. Synthia Innocent of orthopedics consulted in the ED and asked for the patient to remained NPO.  Review of Systems: As per HPI otherwise 10 point review of systems negative.   Past Medical History:  Diagnosis Date  . Acid reflux disease   . Cancer [facility])    Looking for documentation of this  . CLL (chronic lymphocytic leukemia) ([place]) 05/15/2016  . DDD (degenerative disc disease), lumbar   . Degenerative joint disease   . Diabetes mellitus without complication ([place])   . Dyslipidemia   . Hypertension   . 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 Panic attacks   . Pneumonia 02/2016  . 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 Stroke [facility]) age 58 yo   poorly controlled DM and Hypertension    Past Surgical History:  Procedure Laterality Date  . ABDOMINAL HYSTERECTOMY  ? 08/25/2011 ?   Has BSO for left benign ovarian tumor with torsion and broad ligament benign tumor, but does not appear had hysterectomy--[facility], NOT  ovarian cancer  . CHOLECYSTECTOMY    . FLEXIBLE SIGMOIDOSCOPY N/A 05/19/2016   Procedure: FLEXIBLE SIGMOIDOSCOPY;  Surgeon: [name], MD;  Location: [facility];  Service: Endoscopy;  Laterality: N/A;  . FLEXIBLE SIGMOIDOSCOPY N/A 06/28/2016   Procedure: FLEXIBLE SIGMOIDOSCOPY;  Surgeon: Ladene Artist, MD;  Location: [facility];  Service: Endoscopy;  Laterality: N/A;  . LAPAROSCOPIC SIGMOID COLECTOMY N/A 06/30/2016   Procedure: LAPAROSCOPIC SIGMOID COLECTOMY,rigid sigmoidoscopy, repair recurrent incisional hernia;  Surgeon: Michael Boston, MD;  Location: [facility];  Service: General;  Laterality: N/A;  . LAPAROTOMY N/A 04/29/2014   Procedure: EXPLORATORY LAPAROTOMY;  Surgeon: Imogene Burn. Georgette Dover, MD;  Location: [place];  Service: General;  Laterality: N/A;  . LYSIS OF ADHESION N/A 04/29/2014   Procedure: EXTENSIVE LYSIS OF ADHESIONS;  Surgeon: Imogene Burn. Georgette Dover, MD;  Location: [place];  Service: General;  Laterality: N/A;  . VENTRAL HERNIA REPAIR N/A 04/29/2014   Procedure: PRIMARY REPAIR OF VENTRAL HERNIA;  Surgeon: Imogene Burn. Georgette Dover, MD;  Location: [place];  Service: General;  Laterality: N/A;     reports that she quit smoking about 26 years ago. Her smoking use included Cigarettes. She quit smokeless tobacco use about 26 years ago. Her smokeless tobacco use included Snuff. She reports that she does not drink alcohol or use drugs.  Allergies  Allergen Reactions  . Morphine And Related Other (See Comments)    Blood sugar dropped one-time    Family History  Problem Relation Age of Onset  .  Hypertension Mother   . Stroke Mother   . Arthritis Mother   . Alcohol abuse Father     Prior to Admission medications   Medication Sig Start Date End Date Taking? Authorizing Provider  aspirin EC 81 MG tablet Take 1 tablet (81 mg total) by mouth daily. 06/05/16  Yes Heath Lark, MD  feeding supplement, ENSURE ENLIVE, (ENSURE ENLIVE) LIQD Take 237 mLs by mouth 2 (two) times daily between meals. 07/03/16  Yes  Lavina Hamman, MD  furosemide (LASIX) 20 MG tablet Take 20 mg by mouth daily as needed for fluid.   Yes Historical Provider, MD  lisinopril (PRINIVIL,ZESTRIL) 10 MG tablet Take 10 mg by mouth daily.   Yes Historical Provider, MD  Loratadine (CLARITIN PO) Take 10 mg by mouth.   Yes Historical Provider, MD  LOTEMAX 0.5 % ophthalmic suspension Place 1 drop into the right eye daily. 11/03/16  Yes Historical Provider, MD  pantoprazole (PROTONIX) 40 MG tablet Take 1 tablet (40 mg total) by mouth daily. 06/05/16  Yes Heath Lark, MD  polyethylene glycol (MIRALAX / GLYCOLAX) packet Take 17 g by mouth 2 (two) times daily. 05/22/16  Yes Charlynne Cousins, MD  sertraline (ZOLOFT) 25 MG tablet TAKE 1 TABLET BY MOUTH DAILY 09/17/16  Yes Heath Lark, MD  traMADol (ULTRAM) 50 MG tablet Take 1 tablet (50 mg total) by mouth every 6 (six) hours as needed for moderate pain or severe pain. 11/13/16  Yes Heath Lark, MD  allopurinol (ZYLOPRIM) 100 MG tablet Take 1 tablet (100 mg total) by mouth daily. Patient not taking: Reported on 11/23/2016 09/21/16   Heath Lark, MD  amoxicillin-clavulanate (AUGMENTIN) 875-125 MG tablet Take 1 tablet by mouth 2 (two) times daily. Patient not taking: Reported on 11/13/2016 10/13/16   Susanne Borders, NP  bacitracin ointment Apply 1 application topically 2 (two) times daily. Patient not taking: Reported on 11/23/2016 09/19/16   Montine Circle, PA-C  hydrALAZINE (APRESOLINE) 25 MG tablet Take 1 tablet (25 mg total) by mouth every 8 (eight) hours. Patient not taking: Reported on 11/23/2016 07/03/16   Lavina Hamman, MD  methocarbamol (ROBAXIN) 500 MG tablet Take 1 tablet (500 mg total) by mouth every 6 (six) hours as needed for muscle spasms. Patient not taking: Reported on 11/23/2016 07/03/16   Lavina Hamman, MD  prochlorperazine (COMPAZINE) 10 MG tablet Take 1 tablet (10 mg total) by mouth every 6 (six) hours as needed for nausea or vomiting. Patient not taking: Reported on 11/23/2016 08/28/16    Heath Lark, MD    Physical Exam:  Constitutional: Elderly female in moderate distress Vitals:   11/23/16 0200 11/23/16 0243 11/23/16 0300 11/23/16 0400  BP: 165/70 165/70 124/59 139/66  Pulse: 60 (!) 57 (!) 55 (!) 56  Resp: 18 16 19 17   Temp:      TempSrc:      SpO2: 95% 94% 93% 93%   Eyes: PERRL, lids and conjunctivae normal ENMT: Mucous membranes are dry. Posterior pharynx clear of any exudate or lesions. .  Neck: normal, supple, no masses, no thyromegaly Respiratory: clear to auscultation bilaterally, no wheezing, no crackles. Normal respiratory effort. No accessory muscle use.  Cardiovascular: Regular rate and rhythm, no murmurs / rubs / gallops. +1 pitting lower extremity edema. 2+ pedal pulses. No carotid bruits.  Abdomen: no tenderness, no masses palpated. No hepatosplenomegaly. Bowel sounds positive.  Musculoskeletal: no clubbing / cyanosis. Left hip is externally rotated and shortened tenderness with any manipulation noted. Skin: no rashes,  lesions, ulcers. No induration Neurologic: CN 2-12 grossly intact. Sensation intact, DTR normal. Strength 5/5 in all 4.  Psychiatric: Normal judgment and insight. Alert and oriented x 3. Normal mood.     Labs on Admission: I have personally reviewed following labs and imaging studies  CBC:  Recent Labs Lab 11/23/16 0030 11/23/16 0125  WBC 4.3  --   NEUTROABS 2.8  --   HGB 10.2* 11.2*  HCT 31.6* 33.0*  MCV 82.5  --   PLT 138*  --    Basic Metabolic Panel:  Recent Labs Lab 11/23/16 0125  NA 139  K 4.7  CL 102  GLUCOSE 130*  BUN 10  CREATININE 0.80   GFR: CrCl cannot be calculated (Unknown ideal weight.). Liver Function Tests: No results for input(s): AST, ALT, ALKPHOS, BILITOT, PROT, ALBUMIN in the last 168 hours. No results for input(s): LIPASE, AMYLASE in the last 168 hours. No results for input(s): AMMONIA in the last 168 hours. Coagulation Profile: No results for input(s): INR, PROTIME in the last 168  hours. Cardiac Enzymes: No results for input(s): CKTOTAL, CKMB, CKMBINDEX, TROPONINI in the last 168 hours. BNP (last 3 results) No results for input(s): PROBNP in the last 8760 hours. HbA1C: No results for input(s): HGBA1C in the last 72 hours. CBG: No results for input(s): GLUCAP in the last 168 hours. Lipid Profile: No results for input(s): CHOL, HDL, LDLCALC, TRIG, CHOLHDL, LDLDIRECT in the last 72 hours. Thyroid Function Tests: No results for input(s): TSH, T4TOTAL, FREET4, T3FREE, THYROIDAB in the last 72 hours. Anemia Panel: No results for input(s): VITAMINB12, FOLATE, FERRITIN, TIBC, IRON, RETICCTPCT in the last 72 hours. Urine analysis:    Component Value Date/Time   COLORURINE YELLOW 06/28/2016 0652   APPEARANCEUR CLEAR 06/28/2016 0652   LABSPEC 1.044 (H) 06/28/2016 0652   PHURINE 6.0 06/28/2016 0652   GLUCOSEU NEGATIVE 06/28/2016 0652   HGBUR NEGATIVE 06/28/2016 0652   BILIRUBINUR NEGATIVE 06/28/2016 0652   KETONESUR NEGATIVE 06/28/2016 0652   PROTEINUR NEGATIVE 06/28/2016 0652   UROBILINOGEN 0.2 05/17/2015 2307   NITRITE NEGATIVE 06/28/2016 0652   LEUKOCYTESUR NEGATIVE 06/28/2016 0652   Sepsis Labs: No results found for this or any previous visit (from the past 240 hour(s)).   Radiological Exams on Admission: Dg Chest 2 View  Result Date: 11/23/2016 CLINICAL DATA:  82 y/o  F; status post fall. EXAM: CHEST  2 VIEW COMPARISON:  None. FINDINGS: Elevated right hemidiaphragm with colonic interposition over the liver. Linear opacities in lung bases is likely atelectasis. No focal consolidation identified. No pneumothorax. No pleural effusion. Aortic atherosclerosis with calcification. Multilevel degenerative changes of the spine. Stable cardiac silhouette. IMPRESSION: Stable elevated right hemidiaphragm and streaky bibasilar opacities probably representing atelectasis. Aortic atherosclerosis. Electronically Signed   By: Kristine Garbe M.D.   On: 11/23/2016 02:25    Dg Hip Unilat W Or Wo Pelvis 2-3 Views Left  Result Date: 11/23/2016 CLINICAL DATA:  Pain after fall. EXAM: DG HIP (WITH OR WITHOUT PELVIS) 2-3V LEFT COMPARISON:  Abdomen pelvic CT from 06/27/2016 FINDINGS: Nondisplaced acute intertrochanteric fracture of the left femur with avulsed lesser trochanter. The native right hip is maintained. There is osteoarthritic sclerosis about both SI joints with degenerative disc disease of L4-5 and L5-S1. The bony pelvis appears intact. IMPRESSION: Acute left intertrochanteric fracture of the left femur with avulsed lesser trochanter. Degenerative disc disease L4 through S1. 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 No acute pelvic fracture. Electronically Signed   By: Meredith Leeds.D.  On: 11/23/2016 02:25      Assessment/Plan Fall with Left hip fracture: Acute. Patient fell out of wheelchair. X-rays revealed acute left hip fracture. - Admit to a telemetry bed - Hip fracture protocol initiated - Fentanyl when necessary pain - Gentle IV fluid hydration  - Dr. Synthia Innocent of orthopedics to see in a.m., will follow-up further recommendations - Social work consult   Anemia: Hemoglobin 10.2 which appears better than the patient's baseline of 8-9.  - Continue to monitor  Thrombocytopenia: Stable. Platelet count noted to be 138 on admission. This appears near the patient's baseline. - Continue to monitor   History of lymphoma: Patient followed by Dr.Goursch currently on chemotherapy last treatment 11/10. - Family wants him to be notified that the patients here in the hospital   will need to restart home medications when able   DVT prophylaxis: Lovenox    Code Status: Full Family Communication: Discussed discussed overall plan of care with patient and family members present at bedside  Disposition Plan: TBD Consults called:  orthopedics  Admission status:Inpatient   Norval Morton MD [facility] Pager [phone]  If 7PM-7AM, please  contact night-coverage [email or web address] Password [facility]  11/23/2016, 4:39 AM

## 2016-11-23 NOTE — ED Notes (Signed)
Hospitalist speaking with pt and family

## 2016-11-23 NOTE — Transfer of Care (Signed)
Immediate Anesthesia Transfer of Care Note  Patient: Erika Day  Procedure(s) Performed: Procedure(s): INTRAMEDULLARY (IM) NAIL FEMORAL (Left)  Patient Location: PACU  Anesthesia Type:General  Level of Consciousness: Patient remains intubated per anesthesia plan  Airway & Oxygen Therapy: Patient Spontanous Breathing and Patient connected to T-piece oxygen  Post-op Assessment: Report given to RN, Post -op Vital signs reviewed and stable and Patient moving all extremities X 4  Post vital signs: stable  Last Vitals:  Vitals:   11/23/16 1330 11/23/16 2200  BP: (!) 152/54 125/64  Pulse: 62   Resp: 16   Temp: 36.7 C     Last Pain:  Vitals:   11/23/16 1330  TempSrc: Oral  PainSc:       Patients Stated Pain Goal: 3 ([id number])  Complications: No apparent anesthesia complicationsunable to  Find other tooth.

## 2016-11-24 ENCOUNTER — Encounter (HOSPITAL_COMMUNITY): Payer: Self-pay | Admitting: Orthopedic Surgery

## 2016-11-24 LAB — CBC
HCT: 24.9 % — ABNORMAL LOW (ref 36.0–46.0)
HEMOGLOBIN: 8.2 g/dL — AB (ref 12.0–15.0)
MCH: 27.4 pg (ref 26.0–34.0)
MCHC: 32.9 g/dL (ref 30.0–36.0)
MCV: 83.3 fL (ref 78.0–100.0)
PLATELETS: 118 10*3/uL — AB (ref 150–400)
RBC: 2.99 MIL/uL — AB (ref 3.87–5.11)
RDW: 15 % (ref 11.5–15.5)
WBC: 4.2 10*3/uL (ref 4.0–10.5)

## 2016-11-24 LAB — BASIC METABOLIC PANEL
Anion gap: 7 (ref 5–15)
BUN: 12 mg/dL (ref 6–20)
CHLORIDE: 104 mmol/L (ref 101–111)
CO2: 23 mmol/L (ref 22–32)
Calcium: 8.7 mg/dL — ABNORMAL LOW (ref 8.9–10.3)
Creatinine, Ser: 0.86 mg/dL (ref 0.44–1.00)
Glucose, Bld: 153 mg/dL — ABNORMAL HIGH (ref 65–99)
POTASSIUM: 4.5 mmol/L (ref 3.5–5.1)
SODIUM: 134 mmol/L — AB (ref 135–145)

## 2016-11-24 LAB — POCT I-STAT 4, (NA,K, GLUC, HGB,HCT)
Glucose, Bld: 107 mg/dL — ABNORMAL HIGH (ref 65–99)
HEMATOCRIT: 29 % — AB (ref 36.0–46.0)
HEMOGLOBIN: 9.9 g/dL — AB (ref 12.0–15.0)
Potassium: 4.8 mmol/L (ref 3.5–5.1)
Sodium: 136 mmol/L (ref 135–145)

## 2016-11-24 LAB — GLUCOSE, CAPILLARY: Glucose-Capillary: 101 mg/dL — ABNORMAL HIGH (ref 65–99)

## 2016-11-24 MED ORDER — METOCLOPRAMIDE HCL 5 MG PO TABS: 5.0000 mg | ORAL_TABLET | Freq: Three times a day (TID) | ORAL | Status: DC | PRN

## 2016-11-24 MED ORDER — ACETAMINOPHEN 325 MG PO TABS: 650.0000 mg | ORAL_TABLET | Freq: Four times a day (QID) | ORAL | Status: DC | PRN

## 2016-11-24 MED ORDER — ENOXAPARIN SODIUM 40 MG/0.4ML ~~LOC~~ SOLN
40.0000 mg | Freq: Every day | SUBCUTANEOUS | Status: DC
  Administered 2016-11-24 – 2016-11-26 (×3): 40 mg via SUBCUTANEOUS
  Filled 2016-11-24 (×3): qty 0.4

## 2016-11-24 MED ORDER — TRAMADOL HCL 50 MG PO TABS: 50.0000 mg | ORAL_TABLET | Freq: Four times a day (QID) | ORAL | Status: DC | PRN

## 2016-11-24 MED ORDER — METOCLOPRAMIDE HCL 5 MG/ML IJ SOLN: 5.0000 mg | Freq: Three times a day (TID) | INTRAMUSCULAR | Status: DC | PRN

## 2016-11-24 MED ORDER — PHENOL 1.4 % MT LIQD
1.0000 | OROMUCOSAL | Status: DC | PRN
  Filled 2016-11-24: qty 177

## 2016-11-24 MED ORDER — SALINE SPRAY 0.65 % NA SOLN
1.0000 | NASAL | Status: DC | PRN
  Filled 2016-11-24: qty 44

## 2016-11-24 MED ORDER — ACETAMINOPHEN 650 MG RE SUPP: 650.0000 mg | Freq: Four times a day (QID) | RECTAL | Status: DC | PRN

## 2016-11-24 MED ORDER — CEFAZOLIN SODIUM-DEXTROSE 2-4 GM/100ML-% IV SOLN
2.0000 g | Freq: Four times a day (QID) | INTRAVENOUS | Status: AC
Start: 2016-11-24 — End: 2016-11-24
  Administered 2016-11-24 (×2): 2 g via INTRAVENOUS
  Filled 2016-11-24 (×2): qty 100

## 2016-11-24 MED ORDER — ENSURE ENLIVE PO LIQD
237.0000 mL | Freq: Two times a day (BID) | ORAL | Status: DC
  Administered 2016-11-24: 237 mL via ORAL

## 2016-11-24 MED ORDER — FUROSEMIDE 20 MG PO TABS: 20.0000 mg | ORAL_TABLET | Freq: Every day | ORAL | Status: DC | PRN

## 2016-11-24 MED ORDER — ONDANSETRON HCL 4 MG/2ML IJ SOLN: 4.0000 mg | Freq: Four times a day (QID) | INTRAMUSCULAR | Status: DC | PRN

## 2016-11-24 MED ORDER — ONDANSETRON HCL 4 MG PO TABS: 4.0000 mg | ORAL_TABLET | Freq: Four times a day (QID) | ORAL | Status: DC | PRN

## 2016-11-24 MED ORDER — MENTHOL 3 MG MT LOZG
1.0000 | LOZENGE | OROMUCOSAL | Status: DC | PRN
  Filled 2016-11-24: qty 9

## 2016-11-24 MED ORDER — ENSURE ENLIVE PO LIQD
237.0000 mL | Freq: Three times a day (TID) | ORAL | Status: DC
  Administered 2016-11-24 – 2016-11-26 (×4): 237 mL via ORAL

## 2016-11-24 NOTE — Evaluation (Signed)
Physical Therapy Evaluation Patient Details Name: Erika Day MRN: [id number] DOB: 1929/11/24 Today's Date: 11/24/2016   History of Present Illness  83 yo female admitted with intertrochanteric fx of L femur. S/P IM nail L femur 11/23/16. Hx of DDD, DM, CVA, CHF, NHL, colectomy 06/2016  Clinical Impression  On eval, pt required Mod assist +2 for mobility. She was able to stand briefly at EOB with use of a walker. Pt was unable to tolerate any steps to allow for walking or pivoting on today. Mobility is significantly limited by pain at this time. Recommend ST rehab at SNF. Will follow and progress activity as tolerated.     Follow Up Recommendations SNF    Equipment Recommendations  None recommended by PT    Recommendations for Other Services       Precautions / Restrictions Precautions Precautions: Fall Restrictions Weight Bearing Restrictions: No LLE Weight Bearing: Weight bearing as tolerated      Mobility  Bed Mobility Overal bed mobility: Needs Assistance Bed Mobility: Supine to Sit;Sit to Supine     Supine to sit: Mod assist;+2 for physical assistance;+2 for safety/equipment;HOB elevated Sit to supine: Mod assist;+2 for physical assistance;+2 for safety/equipment;HOB elevated   General bed mobility comments: Assist for trunk and bil LEs. Increased time. Cues for safety, technique. Utilized bedpad for scooting, positioning.   Transfers Overall transfer level: Needs assistance Equipment used: Rolling walker (2 wheeled) Transfers: Sit to/from Stand Sit to Stand: Mod assist;+2 physical assistance;+2 safety/equipment;From elevated surface         General transfer comment: Assist to rise, stabilize, control descent. Multimodal cueing for safety, technique, hand/LE placement. Attempted steps at side of bed-pt unable. L LE buckled with attempt.   Ambulation/Gait                Stairs            Wheelchair Mobility    Modified Rankin (Stroke Patients  Only)       Balance Overall balance assessment: Needs assistance;History of Falls           Standing balance-Leahy Scale: Poor                               Pertinent Vitals/Pain Pain Assessment: 0-10 Pain Score: 5  Pain Location: L LE Pain Descriptors / Indicators: Aching;Sore Pain Intervention(s): Limited activity within patient's tolerance;Repositioned    Home Living Family/patient expects to be discharged to:: Skilled nursing facility                      Prior Function Level of Independence: Needs assistance   Gait / Transfers Assistance Needed: Patient reports she transfers bed <> w/c and bed <> BSC on her own.  Uses BSC because w/c will not fit through bathroom door.  ADL's / Homemaking Assistance Needed: Aide assists patient with bathing and helps prepare meals.  Patient also receives mobile meals.        Hand Dominance        Extremity/Trunk Assessment   Upper Extremity Assessment Upper Extremity Assessment: Generalized weakness    Lower Extremity Assessment Lower Extremity Assessment: Generalized weakness    Cervical / Trunk Assessment Cervical / Trunk Assessment: Kyphotic  Communication   Communication: No difficulties  Cognition Arousal/Alertness: Awake/alert Behavior During Therapy: WFL for tasks assessed/performed Overall Cognitive Status: Within Functional Limits for tasks assessed  General Comments      Exercises     Assessment/Plan    PT Assessment Patient needs continued PT services  PT Problem List Decreased strength;Decreased mobility;Decreased activity tolerance;Decreased balance;Decreased knowledge of use of DME;Pain;Decreased knowledge of precautions;Decreased range of motion          PT Treatment Interventions DME instruction;Therapeutic activities;Gait training;Therapeutic exercise;Patient/family education;Functional mobility training;Balance training    PT Goals (Current  goals can be found in the Care Plan section)  Acute Rehab PT Goals Patient Stated Goal: less pain PT Goal Formulation: With patient Time For Goal Achievement: 12/08/16 Potential to Achieve Goals: Good    Frequency Min 3X/week   Barriers to discharge        Co-evaluation               End of Session Equipment Utilized During Treatment: Gait belt Activity Tolerance: Patient limited by pain Patient left: in bed;with call bell/phone within reach;with bed alarm set           Time: [id number] PT Time Calculation (min) (ACUTE ONLY): 18 min   Charges:   PT Evaluation $PT Eval Low Complexity: 1 Procedure     PT G Codes:        Weston Anna, MPT Pager: [phone]

## 2016-11-24 NOTE — Progress Notes (Addendum)
OT Cancellation Note  Patient Details Name: Erika Day MRN: [id number] DOB: [birth year]   Cancelled Treatment:    Reason Eval/Treat Not Completed: Other (comment).  Pt is recommending SNF for pt. Will defer OT evaluation to that venue.  [address], [place] 11/24/2016

## 2016-11-24 NOTE — Progress Notes (Signed)
PROGRESS NOTE                                                                                                                                                                                                             Patient Demographics:    Erika Day, is a 83 y.o. female, DOB - 20-Oct-1930, [id number]  Admit date - 11/22/2016   Admitting Physician Norval Morton, MD  Outpatient Primary MD for the patient is Philis Fendt, MD  LOS - 1  Outpatient Specialists: oncology Dr Alvy Bimler  Chief Complaint  Patient presents with  . Hip Pain       Brief Narrative  83 y.o. female with medical history significant of CLL,  lymphoma, DM2, HTN, HLD, CVA; who presents after having fallen while trying to transfer from her wheelchair, Admitted for left hip fracture, went for surgical repair by Dr. Wynelle Link on 1/22.  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 Patient fell out of wheelchair. X-rays revealed acute left hip fracture. - Hip fracture protocol initiated - Fentanyl when necessary pain -Surgical repair by Dr. Wynelle Link on 1/22 - PT recommended SNF placement  Anemia: Hemoglobin 10.2 which appears better than the patient's baseline of 8-9. - hemoglobin is 8.2 today, this is expected postoperative drop, repeat CBC in a.m. , will need iron supplements on discharge   - Continue to monitor  Thrombocytopenia: Stable. Platelet count noted to be 138 on admission. This appears near the patient's baseline. - Continue to monitor   History of lymphoma:  Patient followed by Dr.Goursch currently on chemotherapy last treatment 11/10.  Hypertension - Today blood pressure on the lower side, so will discontinue antihypertensive medication.  Code Status : Full code  Family Communication  : none at bedside  Disposition Plan  : will need SNF placemnt, in 1-2 days  Consults  :  Ortho  Procedures  : None  DVT Prophylaxis  :  Lovenox - SCDs   Lab Results  Component Value Date   PLT 118 (L) 11/24/2016    Antibiotics  :   Anti-infectives    Start     Dose/Rate Route Frequency Ordered Stop   11/24/16 0300  ceFAZolin (ANCEF) IVPB 2g/100 mL premix     2 g 200 mL/hr over 30 Minutes Intravenous Every 6 hours 11/24/16 0002 11/24/16 1023   11/23/16 1800  ceFAZolin (ANCEF) IVPB 2g/100 mL premix  Status:  Discontinued     2 g 200 mL/hr over 30 Minutes Intravenous On call to O.R. 11/23/16 0748 11/24/16 0002        Objective:   Vitals:   11/23/16 2330 11/23/16 2355 11/24/16 0445 11/24/16 1422  BP:  (!) 130/58 (!) 143/71 (!) 96/42  Pulse:  73 69 67  Resp:  12 16 18   Temp: 97.9 F (36.6 C) 98 F (36.7 C) 98.8 F (37.1 C) 98.5 F (36.9 C)  TempSrc:   Oral Oral  SpO2:  92% 100% 93%  Weight:      Height:        Wt Readings from Last 3 Encounters:  11/23/16 66.6 kg (146 lb 13.2 oz)  10/13/16 61.9 kg (136 lb 8 oz)  09/19/16 62 kg (136 lb 9.6 oz)     Intake/Output Summary (Last 24 hours) at 11/24/16 1457 Last data filed at 11/24/16 0600  Gross per 24 hour  Intake          3332.08 ml  Output              600 ml  Net          2732.08 ml     Physical Exam  Awake Alert, frail, Supple Neck,No JVD, Symmetrical Chest wall movement, Good air movement bilaterally, CTAB RRR,No Gallops,Rubs or new Murmurs, No Parasternal Heave +ve B.Sounds, Abd Soft, No tenderness,  No rebound - guarding or rigidity. No Cyanosis, Clubbing or edema, No new Rash or bruise      Data Review:    CBC  Recent Labs Lab 11/23/16 0030 11/23/16 0125  11/23/16 0629 11/23/16 2049 11/24/16 0501  WBC 4.3  --  3.8*  --  4.2  HGB 10.2* 11.2* 9.2* 9.9* 8.2*  HCT 31.6* 33.0* 28.1* 29.0* 24.9*  PLT 138*  --  134*  --  118*  MCV 82.5  --  82.9  --  83.3  MCH 26.6  --  27.1  --  27.4  MCHC 32.3  --  32.7  --  32.9  RDW 14.9  --  15.1  --  15.0  LYMPHSABS 1.0  --   --   --   --   MONOABS 0.3  --   --   --   --   EOSABS 0.1  --   --   --   --   BASOSABS 0.0  --   --   --   --     Chemistries   Recent Labs Lab 11/23/16 0125 11/23/16 0629 11/23/16 2049 11/24/16 0501  NA 139 136 136 134*  K 4.7 4.1 4.8 4.5  CL 102 103  --  104  CO2  --  26  --  23  GLUCOSE 130* 157* 107* 153*  BUN 10 12  --  12  CREATININE 0.80 0.73  --  0.86  CALCIUM  --  9.3  --  8.7*   ------------------------------------------------------------------------------------------------------------------ No results  for input(s): CHOL, HDL, LDLCALC, TRIG, CHOLHDL, LDLDIRECT in the last 72 hours.  Lab Results  Component Value Date   HGBA1C 5.4 06/29/2016   ------------------------------------------------------------------------------------------------------------------ No results for input(s): TSH, T4TOTAL, T3FREE, THYROIDAB in the last 72 hours.  Invalid input(s): FREET3 ------------------------------------------------------------------------------------------------------------------ No results for input(s): VITAMINB12, FOLATE, FERRITIN, TIBC, IRON, RETICCTPCT in the last 72 hours.  Coagulation profile No results for input(s): INR, PROTIME in the last 168 hours.  No results for input(s): DDIMER in the last 72 hours.  Cardiac Enzymes No results for input(s): CKMB, TROPONINI, MYOGLOBIN in the last 168 hours.  Invalid input(s): CK ------------------------------------------------------------------------------------------------------------------    Component Value Date/Time   BNP 443.6 (H) 02/13/2016 0350    Inpatient Medications  Scheduled Meds: .  enoxaparin (LOVENOX) injection  40 mg Subcutaneous Daily  . feeding supplement (ENSURE ENLIVE)  237 mL Oral TID BM  . hydrALAZINE  25 mg Oral Q8H  . loteprednol  1 drop Right Eye Daily  . pantoprazole  40 mg Oral Daily  . polyethylene glycol  17 g Oral BID  . sertraline  25 mg Oral Daily   Continuous Infusions: . sodium chloride 75 mL/hr at 11/24/16 0912   PRN Meds:.acetaminophen **OR** acetaminophen, fentaNYL (SUBLIMAZE) injection, fentaNYL (SUBLIMAZE) injection, furosemide, hydrALAZINE, HYDROcodone-acetaminophen, menthol-cetylpyridinium **OR** phenol, methocarbamol **OR** methocarbamol (ROBAXIN)  IV, metoCLOPramide **OR** metoCLOPramide (REGLAN) injection, ondansetron **OR** ondansetron (ZOFRAN) IV, traMADol  Micro Results Recent Results (from the past 240 hour(s))  Surgical PCR screen     Status: None   Collection Time: 11/23/16  7:40 AM  Result Value Ref Range Status   MRSA, PCR NEGATIVE NEGATIVE Final   Staphylococcus aureus NEGATIVE NEGATIVE Final    Comment:        The Xpert SA Assay (FDA approved for NASAL specimens in patients over 54 years of age), is one component of a comprehensive surveillance program.  Test performance has been validated by [facility] for patients greater than or equal to 40 year old. It is not intended to diagnose infection nor to guide or monitor treatment.     Radiology Reports Dg Chest 2 View  Result Date: 11/23/2016 CLINICAL DATA:  83 y/o  F; status post fall. EXAM: CHEST  2 VIEW COMPARISON:  None. FINDINGS: Elevated right hemidiaphragm with colonic interposition over the liver. Linear opacities in lung bases is likely atelectasis. No focal consolidation identified. No pneumothorax. No pleural effusion. Aortic atherosclerosis with calcification. Multilevel degenerative changes of the spine. Stable cardiac silhouette. IMPRESSION: Stable elevated right hemidiaphragm and streaky bibasilar opacities probably representing atelectasis. Aortic  atherosclerosis. Electronically Signed   By: Kristine Garbe M.D.   On: 11/23/2016 02:25   Dg Cervical Spine 1 View  Result Date: 11/23/2016 CLINICAL DATA:  Possible tooth aspiration after surgery EXAM: CERVICAL SPINE 1 VIEW COMPARISON:  None. FINDINGS: The patient's head and neck are turned to the right. Endotracheal tube tip is seen to the level of the aortic arch. No radiopaque foreign body is seen. Elevated right hemidiaphragm with atelectasis. No pneumothorax. A molar tooth like density projects over the left mandibular ramus which might correspond with the patient's missing tooth, seen in the oral cavity. Correlate. IMPRESSION: There is a molar tooth like density projecting adjacent to the left mandibular ramus, seen within the oral cavity. Correlate clinically. Electronically Signed   By: Ashley Royalty M.D.   On: 11/23/2016 22:34   Nm Pet Image Restag (ps) Skull Base To Thigh  Result Date: 11/13/2016 CLINICAL DATA:  Subsequent treatment strategy for  lymphoma. Low-grade non-Hodgkin's B-cell lymphoma. Surgery for abdominal is obstruction demonstrated lymphoma involvement of the GI tract. EXAM: NUCLEAR MEDICINE PET SKULL BASE TO THIGH TECHNIQUE: 6.0 mCi F-18 FDG was injected intravenously. Full-ring PET imaging was performed from the skull base to thigh after the radiotracer. CT data was obtained and used for attenuation correction and anatomic localization. FASTING BLOOD GLUCOSE:  Value: 75 mg/dl COMPARISON:  PET-CT 05/25/2016 FINDINGS: NECK No hypermetabolic lymph nodes in the neck. Asymmetric hypermetabolic muscle activity in the RIGHT neck is favored physiologic. Uptake in LEFT and RIGHT lobe of thyroid gland (greater on the LEFT) similar to comparison exam. CHEST Persistent hypermetabolic precarinal lymph node with SUV max equal 4.2 compared to 3.9. Lymph node unchanged in size at 1.5 cm. RIGHT axial lymph node measuring 1.0 cm with SUV max equal 1.8 decreased from 2.6. 8 mm LEFT axial lymph  node is decreased in metabolic activity and size from 11 mm on comparison exam with no significant activity. ABDOMEN/PELVIS No abnormal hypermetabolic activity within the liver, pancreas, adrenal glands, or spleen. No abnormal activity associated with the colon or small bowel. Atherosclerotic calcification of the aorta. Mild activity in the RIGHT inguinal lymph node with SUV max equal 2.6 compares to 2.9 on prior. No change in size. SKELETON No focal hypermetabolic activity to suggest skeletal metastasis. IMPRESSION: 1. No evidence disease progression. 2. Mild decrease in metabolic activity in axillary lymph nodes. 3. Similar metabolic activity of small mediastinal lymph nodes [place] 3 . 4. Similar activity in inguinal lymph nodes. 5. No abnormal bowel activity identified. Electronically Signed   By: Suzy Bouchard M.D.   On: 11/13/2016 10:36   Dg Chest Port 1 View  Result Date: 11/23/2016 CLINICAL DATA:  Possible tooth aspiration status post surgery. EXAM: PORTABLE CHEST 1 VIEW COMPARISON:  11/23/2016 CXR at 1:16 p.m., chest CT 02/13/2016 the FINDINGS: Endotracheal tube tip is seen 3.3 cm above the carina in satisfactory position. No radiopaque foreign bodies seen along the endotracheal tube. Elevated right hemidiaphragm with decrease in atelectasis since prior. No pneumonic consolidation. Colonic interposition over the right liver shadow. Cholecystectomy clips in the right upper quadrant. No acute osseous abnormality. IMPRESSION: Endotracheal tube tip in satisfactory position. No radiopaque foreign body identified. Chronically elevated right hemidiaphragm with slight decrease in bibasilar atelectasis. Electronically Signed   By: Ashley Royalty M.D.   On: 11/23/2016 22:29   Dg C-arm 1-60 Min-no Report  Result Date: 11/23/2016 There is no Radiologist interpretation  for this exam.  Dg Hip Operative Unilat W Or W/o Pelvis Left  Result Date: 11/23/2016 CLINICAL DATA:  Left hip fracture EXAM: OPERATIVE  LEFT HIP WITH PELVIS COMPARISON:  11/23/2016 FLUOROSCOPY TIME:  Radiation Exposure Index (as provided by the fluoroscopic device): 2.49 mGy If the device does not provide the exposure index: Fluoroscopy Time:  32 seconds Number of Acquired Images:  3 FINDINGS: Medullary rod and proximal fixation screw are noted on the left. Fracture fragments are in near anatomic alignment. IMPRESSION: Status post ORIF of proximal left femoral fracture Electronically Signed   By: Inez Catalina M.D.   On: 11/23/2016 21:51   Dg Hip Unilat W Or Wo Pelvis 2-3 Views Left  Result Date: 11/23/2016 CLINICAL DATA:  Pain after fall. EXAM: DG HIP (WITH OR WITHOUT PELVIS) 2-3V LEFT COMPARISON:  Abdomen pelvic CT from 06/27/2016 FINDINGS: Nondisplaced acute intertrochanteric fracture of the left femur with avulsed lesser trochanter. The native right hip is maintained. There is osteoarthritic sclerosis about both SI joints with degenerative  disc disease of L4-5 and L5-S1. The bony pelvis appears intact. IMPRESSION: Acute left intertrochanteric fracture of the left femur with avulsed lesser trochanter. Degenerative disc disease L4 through S1. 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 No acute pelvic fracture. Electronically Signed   By: Ashley Royalty M.D.   On: 11/23/2016 02:25    [facility], Lalana Wachter M.D on 11/24/2016 at 2:57 PM  Between 7am to 7pm - Pager - [phone]  After 7pm go to [email or web address] - password [facility]  [facility] -  Office  [phone]

## 2016-11-25 DIAGNOSIS — C8304 Small cell B-cell lymphoma, lymph nodes of axilla and upper limb: Secondary | ICD-10-CM

## 2016-11-25 DIAGNOSIS — D696 Thrombocytopenia, unspecified: Secondary | ICD-10-CM

## 2016-11-25 DIAGNOSIS — D638 Anemia in other chronic diseases classified elsewhere: Secondary | ICD-10-CM

## 2016-11-25 DIAGNOSIS — S72145S Nondisplaced intertrochanteric fracture of left femur, sequela: Secondary | ICD-10-CM

## 2016-11-25 LAB — BASIC METABOLIC PANEL
ANION GAP: 9 (ref 5–15)
BUN: 26 mg/dL — ABNORMAL HIGH (ref 6–20)
CALCIUM: 8.7 mg/dL — AB (ref 8.9–10.3)
CHLORIDE: 104 mmol/L (ref 101–111)
CO2: 23 mmol/L (ref 22–32)
CREATININE: 1.02 mg/dL — AB (ref 0.44–1.00)
GFR calc non Af Amer: 49 mL/min — ABNORMAL LOW (ref >60)
GFR, EST AFRICAN AMERICAN: 57 mL/min — AB (ref >60)
Glucose, Bld: 133 mg/dL — ABNORMAL HIGH (ref 65–99)
Potassium: 4 mmol/L (ref 3.5–5.1)
SODIUM: 136 mmol/L (ref 135–145)

## 2016-11-25 LAB — CBC
HEMATOCRIT: 23.3 % — AB (ref 36.0–46.0)
HEMOGLOBIN: 7.6 g/dL — AB (ref 12.0–15.0)
MCH: 27.5 pg (ref 26.0–34.0)
MCHC: 32.6 g/dL (ref 30.0–36.0)
MCV: 84.4 fL (ref 78.0–100.0)
Platelets: 112 10*3/uL — ABNORMAL LOW (ref 150–400)
RBC: 2.76 MIL/uL — ABNORMAL LOW (ref 3.87–5.11)
RDW: 15.4 % (ref 11.5–15.5)
WBC: 3.9 10*3/uL — AB (ref 4.0–10.5)

## 2016-11-25 LAB — PREPARE RBC (CROSSMATCH)

## 2016-11-25 MED ORDER — ACETAMINOPHEN 325 MG PO TABS
650.0000 mg | ORAL_TABLET | Freq: Once | ORAL | Status: AC
  Administered 2016-11-25: 650 mg via ORAL
  Filled 2016-11-25: qty 2

## 2016-11-25 MED ORDER — BISACODYL 10 MG RE SUPP
10.0000 mg | Freq: Once | RECTAL | Status: AC
  Administered 2016-11-25: 10 mg via RECTAL
  Filled 2016-11-25: qty 1

## 2016-11-25 MED ORDER — SODIUM CHLORIDE 0.9 % IV SOLN
Freq: Once | INTRAVENOUS | Status: AC
  Administered 2016-11-25: 16:00:00 via INTRAVENOUS

## 2016-11-25 MED ORDER — ALUM & MAG HYDROXIDE-SIMETH 200-200-20 MG/5ML PO SUSP
15.0000 mL | Freq: Once | ORAL | Status: AC
  Administered 2016-11-25: 15 mL via ORAL
  Filled 2016-11-25: qty 30

## 2016-11-25 NOTE — Progress Notes (Signed)
PROGRESS NOTE    Erika Day   [id number]  DOB: 19-Sep-1930  DOA: 11/22/2016 PCP: Philis Fendt, MD   Brief Narrative:  83 y.o.femalewith medical history significant of CLL, lymphoma, DM2, HTN, HLD,CVA; who presents after having fallen while trying to transfer from her wheelchair. Admitted for left hip fracture. Subjective: No complaints of pain.   Assessment & Plan:  Fall with Left hip fracture -  Acute. Patient fell out of wheelchair. X-rays revealed acute left hip fracture. -Surgical repair by Dr. Wynelle Link on 1/22 with IM nail - PT recommended SNF placement  Anemia:  - Hemoglobin 10.2 has dropped to 7.6- Ortho is transfusing 2 u PRBC - will need iron supplements on discharge  - Continue to monitor  Thrombocytopenia:  - Stable.Platelet count noted to be 138 on admission. This appears near the patient's baseline. - Continue to monitor   History of lymphoma:  - Patient followed by Dr.Goursch- currently on chemotherapy last treatment 11/10.  Hypotension with h/o Hypertension - discontinued antihypertensive medication.  Mild AKI - possibly due to acute blood loss and hypotension- follow - ACE on hold  Gout - Allopurinol  DVT prophylaxis: Lovenox Code Status: Full code Family Communication:  Disposition Plan: home vs SNF when stable Consultants:   ortho Procedures:  Intramedullary nailing, Left intertrochanteric femur  fracture.  Antimicrobials:  Anti-infectives    Start     Dose/Rate Route Frequency Ordered Stop   11/24/16 0300  ceFAZolin (ANCEF) IVPB 2g/100 mL premix     2 g 200 mL/hr over 30 Minutes Intravenous Every 6 hours 11/24/16 0002 11/24/16 1023   11/23/16 1800  ceFAZolin (ANCEF) IVPB 2g/100 mL premix  Status:  Discontinued     2 g 200 mL/hr over 30 Minutes Intravenous On call to O.R. 11/23/16 0748 11/24/16 0002       Objective: Vitals:   11/24/16 1550 11/24/16 2222 11/25/16 0431 11/25/16 1337  BP: (!) 95/49 123/72 139/79 (!)  99/46  Pulse: 70 88 90 72  Resp:  18    Temp:  99.3 F (37.4 C) 97.9 F (36.6 C) 98.6 F (37 C)  TempSrc:  Oral Oral Oral  SpO2: 92% 95% 100% 94%  Weight:   70 kg (154 lb 5.2 oz)   Height:        Intake/Output Summary (Last 24 hours) at 11/25/16 1551 Last data filed at 11/25/16 1338  Gross per 24 hour  Intake              420 ml  Output              550 ml  Net             -130 ml   Filed Weights   11/23/16 0543 11/25/16 0431  Weight: 66.6 kg (146 lb 13.2 oz) 70 kg (154 lb 5.2 oz)    Examination: General exam: Appears comfortable  HEENT: PERRLA, oral mucosa moist, no sclera icterus or thrush Respiratory system: Clear to auscultation. Respiratory effort normal. Cardiovascular system: S1 & S2 heard, RRR.  No murmurs  Gastrointestinal system: Abdomen soft, non-tender, nondistended. Normal bowel sound. No organomegaly Central nervous system: Alert and oriented. No focal neurological deficits. Extremities: No cyanosis, clubbing or edema Skin: No rashes or ulcers Psychiatry:  Mood & affect appropriate.     Data Reviewed: I have personally reviewed following labs and imaging studies  CBC:  Recent Labs Lab 11/23/16 0030 11/23/16 0125 11/23/16 0629 11/23/16 2049 11/24/16 0501 11/25/16 0536  WBC  4.3  --  3.8*  --  4.2 3.9*  NEUTROABS 2.8  --   --   --   --   --   HGB 10.2* 11.2* 9.2* 9.9* 8.2* 7.6*  HCT 31.6* 33.0* 28.1* 29.0* 24.9* 23.3*  MCV 82.5  --  82.9  --  83.3 84.4  PLT 138*  --  134*  --  118* [id number]*   Basic Metabolic Panel:  Recent Labs Lab 11/23/16 0125 11/23/16 0629 11/23/16 2049 11/24/16 0501 11/25/16 0536  NA 139 136 136 134* 136  K 4.7 4.1 4.8 4.5 4.0  CL 102 103  --  104 104  CO2  --  26  --  23 23  GLUCOSE 130* 157* 107* 153* 133*  BUN 10 12  --  12 26*  CREATININE 0.80 0.73  --  0.86 1.02*  CALCIUM  --  9.3  --  8.7* 8.7*   GFR: Estimated Creatinine Clearance: 40.3 mL/min (by C-G formula based on SCr of 1.02 mg/dL (H)). Liver Function  Tests: No results for input(s): AST, ALT, ALKPHOS, BILITOT, PROT, ALBUMIN in the last 168 hours. No results for input(s): LIPASE, AMYLASE in the last 168 hours. No results for input(s): AMMONIA in the last 168 hours. Coagulation Profile: No results for input(s): INR, PROTIME in the last 168 hours. Cardiac Enzymes: No results for input(s): CKTOTAL, CKMB, CKMBINDEX, TROPONINI in the last 168 hours. BNP (last 3 results) No results for input(s): PROBNP in the last 8760 hours. HbA1C: No results for input(s): HGBA1C in the last 72 hours. CBG:  Recent Labs Lab 11/23/16 2216  GLUCAP 101*   Lipid Profile: No results for input(s): CHOL, HDL, LDLCALC, TRIG, CHOLHDL, LDLDIRECT in the last 72 hours. Thyroid Function Tests: No results for input(s): TSH, T4TOTAL, FREET4, T3FREE, THYROIDAB in the last 72 hours. Anemia Panel: No results for input(s): VITAMINB12, FOLATE, FERRITIN, TIBC, IRON, RETICCTPCT in the last 72 hours. Urine analysis:    Component Value Date/Time   COLORURINE YELLOW 06/28/2016 0652   APPEARANCEUR CLEAR 06/28/2016 0652   LABSPEC 1.044 (H) 06/28/2016 0652   PHURINE 6.0 06/28/2016 0652   GLUCOSEU NEGATIVE 06/28/2016 0652   HGBUR NEGATIVE 06/28/2016 0652   BILIRUBINUR NEGATIVE 06/28/2016 0652   KETONESUR NEGATIVE 06/28/2016 0652   PROTEINUR NEGATIVE 06/28/2016 0652   UROBILINOGEN 0.2 05/17/2015 2307   NITRITE NEGATIVE 06/28/2016 0652   LEUKOCYTESUR NEGATIVE 06/28/2016 [id number]   Sepsis Labs: @LABRCNTIP (procalcitonin:4,lacticidven:4) ) Recent Results (from the past 240 hour(s))  Surgical PCR screen     Status: None   Collection Time: 11/23/16  7:40 AM  Result Value Ref Range Status   MRSA, PCR NEGATIVE NEGATIVE Final   Staphylococcus aureus NEGATIVE NEGATIVE Final    Comment:        The Xpert SA Assay (FDA approved for NASAL specimens in patients over 87 years of age), is one component of a comprehensive surveillance program.  Test performance has been validated  by [facility] for patients greater than or equal to 39 year old. It is not intended to diagnose infection nor to guide or monitor treatment.          Radiology Studies: Dg Cervical Spine 1 View  Result Date: 11/23/2016 CLINICAL DATA:  Possible tooth aspiration after surgery EXAM: CERVICAL SPINE 1 VIEW COMPARISON:  None. FINDINGS: The patient's head and neck are turned to the right. Endotracheal tube tip is seen to the level of the aortic arch. No radiopaque foreign body is seen. Elevated right hemidiaphragm with  atelectasis. No pneumothorax. A molar tooth like density projects over the left mandibular ramus which might correspond with the patient's missing tooth, seen in the oral cavity. Correlate. IMPRESSION: There is a molar tooth like density projecting adjacent to the left mandibular ramus, seen within the oral cavity. Correlate clinically. Electronically Signed   By: Ashley Royalty M.D.   On: 11/23/2016 22:34   Dg Chest Port 1 View  Result Date: 11/23/2016 CLINICAL DATA:  Possible tooth aspiration status post surgery. EXAM: PORTABLE CHEST 1 VIEW COMPARISON:  11/23/2016 CXR at 1:16 p.m., chest CT 02/13/2016 the FINDINGS: Endotracheal tube tip is seen 3.3 cm above the carina in satisfactory position. No radiopaque foreign bodies seen along the endotracheal tube. Elevated right hemidiaphragm with decrease in atelectasis since prior. No pneumonic consolidation. Colonic interposition over the right liver shadow. Cholecystectomy clips in the right upper quadrant. No acute osseous abnormality. IMPRESSION: Endotracheal tube tip in satisfactory position. No radiopaque foreign body identified. Chronically elevated right hemidiaphragm with slight decrease in bibasilar atelectasis. Electronically Signed   By: Ashley Royalty M.D.   On: 11/23/2016 22:29   Dg C-arm 1-60 Min-no Report  Result Date: 11/23/2016 There is no Radiologist interpretation  for this exam.  Dg Hip Operative Unilat W Or W/o Pelvis  Left  Result Date: 11/23/2016 CLINICAL DATA:  Left hip fracture EXAM: OPERATIVE LEFT HIP WITH PELVIS COMPARISON:  11/23/2016 FLUOROSCOPY TIME:  Radiation Exposure Index (as provided by the fluoroscopic device): 2.49 mGy If the device does not provide the exposure index: Fluoroscopy Time:  32 seconds Number of Acquired Images:  3 FINDINGS: Medullary rod and proximal fixation screw are noted on the left. Fracture fragments are in near anatomic alignment. IMPRESSION: Status post ORIF of proximal left femoral fracture Electronically Signed   By: Inez Catalina M.D.   On: 11/23/2016 21:51      Scheduled Meds: . sodium chloride   Intravenous Once  . acetaminophen  650 mg Oral Once  . enoxaparin (LOVENOX) injection  40 mg Subcutaneous Daily  . feeding supplement (ENSURE ENLIVE)  237 mL Oral TID BM  . loteprednol  1 drop Right Eye Daily  . pantoprazole  40 mg Oral Daily  . polyethylene glycol  17 g Oral BID  . sertraline  25 mg Oral Daily   Continuous Infusions: . sodium chloride 75 mL/hr at 11/24/16 2324     LOS: 2 days    Time spent in minutes: 32    Mandan, MD [facility] Pager: [email or web address] Password [facility] 11/25/2016, 3:51 PM

## 2016-11-25 NOTE — Progress Notes (Signed)
Subjective: 2 Days Post-Op Procedure(s) (LRB): INTRAMEDULLARY (IM) NAIL FEMORAL (Left) Patient reports pain as mild.  She feels weak and was light headed sitting up yesterday  Objective: Vital signs in last 24 hours: Temp:  [97.9 F (36.6 C)-99.3 F (37.4 C)] 97.9 F (36.6 C) (01/24 0431) Pulse Rate:  [67-90] 90 (01/24 0431) Resp:  [18] 18 (01/23 2222) BP: (95-139)/(42-79) 139/79 (01/24 0431) SpO2:  [92 %-100 %] 100 % (01/24 0431) Weight:  [70 kg (154 lb 5.2 oz)] 70 kg (154 lb 5.2 oz) (01/24 0431)  Intake/Output from previous day: 01/23 0701 - 01/24 0700 In: 360 [P.O.:360] Out: 850 [Urine:850] Intake/Output this shift: No intake/output data recorded.   Recent Labs  11/23/16 0125 11/23/16 0629 11/23/16 2049 11/24/16 0501 11/25/16 0536  HGB 11.2* 9.2* 9.9* 8.2* 7.6*    Recent Labs  11/24/16 0501 11/25/16 0536  WBC 4.2 3.9*  RBC 2.99* 2.76*  HCT 24.9* 23.3*  PLT 118* 112*    Recent Labs  11/24/16 0501 11/25/16 0536  NA 134* 136  K 4.5 4.0  CL 104 104  CO2 23 23  BUN 12 26*  CREATININE 0.86 1.02*  GLUCOSE 153* 133*  CALCIUM 8.7* 8.7*   No results for input(s): LABPT, INR in the last 72 hours.  Neurovascular intact No cellulitis present Compartment soft  Assessment/Plan: 2 Days Post-Op Procedure(s) (LRB): INTRAMEDULLARY (IM) NAIL FEMORAL (Left) Up with therapy  Will transfuse 2 units PRBCs for symptomatic anemia. Discussed with patient who agrees with plan  Gearlean Alf 11/25/2016, 9:27 AM

## 2016-11-25 NOTE — NC FL2 (Signed)
[place] LEVEL OF CARE SCREENING TOOL     IDENTIFICATION  Patient Name: Erika Day Birthdate: [birth year] Sex: female Admission Date (Current Location): 11/22/2016  [employer] and [place] Number:  Herbalist and Address:  [facility],  [place] [address], [place]      Provider Number: [id number]  Attending Physician Name and Address:  Debbe Odea, MD  Relative Name and Phone Number:       Current Level of Care: Hospital Recommended Level of Care: [place] Prior Approval Number:    Date Approved/Denied:   PASRR Number:  ([id number])  Discharge Plan: SNF    Current Diagnoses: Patient Active Problem List   Diagnosis Date Noted  . Femur fracture, left ([place]) 11/23/2016  . Fall 11/23/2016  . Closed fracture of intertrochanteric section of femur ([place]) 11/23/2016  . Other pancytopenia ([place]) 11/13/2016  . Sinusitis 10/14/2016  . Bleeding external hemorrhoids 08/20/2016  . Anemia due to chronic illness 08/14/2016  . Recurrent ventral incisional hernia s/p primary repair 06/30/2016 07/01/2016  . Pressure ulcer 05/20/2016  . Lower extremity edema 05/20/2016  . Protein-calorie malnutrition, severe ([place]) 05/20/2016  . Volvulus of sigmoid colon ([place])   . Sigmoid volvulus s/p lap sigmoid colectomy 06/30/2016 05/19/2016  . 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 Constipation 05/15/2016  . Other fatigue 05/15/2016  . CAP (community acquired pneumonia) 02/13/2016  . Anemia, unspecified 02/13/2016  . Thrombocytopenia ([place]) 02/13/2016  . Hyponatremia 02/13/2016  . Kyphosis 02/01/2016  . Pressure ulcer of coccygeal region 01/31/2016  . Mobility impaired 01/31/2016  . Osteoarthritis 01/31/2016  . Essential hypertension 09/06/2011  . Ovarian torsion 08/25/2011    Orientation RESPIRATION BLADDER Height & Weight     Self, Time, Situation,  Place  Normal Continent Weight: 154 lb 5.2 oz (70 kg) Height:  5\' 5"  (165.1 cm)  BEHAVIORAL SYMPTOMS/MOOD NEUROLOGICAL BOWEL NUTRITION STATUS   (none )  (none ) Continent Diet (DYS 1)  AMBULATORY STATUS COMMUNICATION OF NEEDS Skin   Extensive Assist Verbally Surgical wounds                       Personal Care Assistance Level of Assistance  Bathing, Feeding, Dressing Bathing Assistance: Maximum assistance Feeding assistance: Independent Dressing Assistance: Maximum assistance     Functional Limitations Info  Speech, Hearing, Sight Sight Info: Adequate Hearing Info: Adequate Speech Info: Adequate    SPECIAL CARE FACTORS FREQUENCY  PT (By licensed PT), OT (By licensed OT)     PT Frequency: 3 OT Frequency: 2            Contractures      Additional Factors Info  Code Status, Allergies Code Status Info: FULL CODE  Allergies Info: Morphine and Related            Current Medications (11/25/2016):  This is the current hospital active medication list Current Facility-Administered Medications  Medication Dose Route Frequency Provider Last Rate Last Dose  . 0.9 %  sodium chloride infusion   Intravenous Continuous Norval Morton, MD 75 mL/hr at 11/24/16 2324    . 0.9 %  sodium chloride infusion   Intravenous Once Gaynelle Arabian, MD      . acetaminophen (TYLENOL) tablet 650 mg  650 mg Oral Q6H PRN Gaynelle Arabian, MD       Or  . acetaminophen (TYLENOL) suppository 650 mg  650 mg Rectal Q6H PRN Gaynelle Arabian, MD      . acetaminophen (TYLENOL) tablet 650 mg  650 mg Oral Once Gaynelle Arabian, MD      . enoxaparin (LOVENOX) injection 40 mg  40 mg Subcutaneous Daily Gaynelle Arabian, MD   40 mg at 11/25/16 0918  . feeding supplement (ENSURE ENLIVE) (ENSURE ENLIVE) liquid 237 mL  237 mL Oral TID BM Albertine [name], MD   237 mL at 11/25/16 0919  . fentaNYL (SUBLIMAZE) injection 25-50 mcg  25-50 mcg Intravenous Q5 min PRN Belinda Block, MD      . fentaNYL (SUBLIMAZE) injection 50  mcg  50 mcg Intravenous Q2H PRN Norval Morton, MD   50 mcg at 11/25/16 0543  . furosemide (LASIX) tablet 20 mg  20 mg Oral Daily PRN Gaynelle Arabian, MD      . hydrALAZINE (APRESOLINE) injection 10 mg  10 mg Intravenous Q4H PRN Albertine [name], MD      . HYDROcodone-acetaminophen (NORCO/VICODIN) 5-325 MG per tablet 1-2 tablet  1-2 tablet Oral Q6H PRN Norval Morton, MD   1 tablet at 11/25/16 0919  . loteprednol (LOTEMAX) 0.5 % ophthalmic suspension 1 drop  1 drop Right Eye Daily Albertine [name], MD   1 drop at 11/25/16 0919  . menthol-cetylpyridinium (CEPACOL) lozenge 3 mg  1 lozenge Oral PRN Gaynelle Arabian, MD       Or  . phenol (CHLORASEPTIC) mouth spray 1 spray  1 spray Mouth/Throat PRN Gaynelle Arabian, MD      . methocarbamol (ROBAXIN) tablet 500 mg  500 mg Oral Q6H PRN Norval Morton, MD   500 mg at 11/25/16 0543   Or  . methocarbamol (ROBAXIN) 500 mg in dextrose 5 % 50 mL IVPB  500 mg Intravenous Q6H PRN Norval Morton, MD      . metoCLOPramide (REGLAN) tablet 5-10 mg  5-10 mg Oral Q8H PRN Gaynelle Arabian, MD       Or  . metoCLOPramide (REGLAN) injection 5-10 mg  5-10 mg Intravenous Q8H PRN Gaynelle Arabian, MD      . ondansetron [facility]) tablet 4 mg  4 mg Oral Q6H PRN Gaynelle Arabian, MD       Or  . ondansetron (ZOFRAN) injection 4 mg  4 mg Intravenous Q6H PRN Gaynelle Arabian, MD      . pantoprazole (PROTONIX) EC tablet 40 mg  40 mg Oral Daily Albertine [name], MD   40 mg at 11/25/16 0919  . polyethylene glycol (MIRALAX / GLYCOLAX) packet 17 g  17 g Oral BID Albertine [name], MD   17 g at 11/25/16 0919  . sertraline (ZOLOFT) tablet 25 mg  25 mg Oral Daily Albertine [name], MD   25 mg at 11/25/16 0919  . sodium chloride (OCEAN) 0.65 % nasal spray 1 spray  1 spray Each Nare PRN Albertine [name], MD      . traMADol Veatrice Bourbon) tablet 50 mg  50 mg Oral Q6H PRN Gaynelle Arabian, MD         Discharge Medications: Please see discharge summary for a list of discharge medications.  Relevant  Imaging Results:  Relevant Lab Results:   Additional Information SSN [id number]  Rozell Searing

## 2016-11-25 NOTE — Care Management Note (Signed)
Case Management Note  Patient Details  Name: Erika Day MRN: [id number] Date of Birth: 10/17/1930  Subjective/Objective:   Pt admitted with Left femur fracture. Surgery Left intertrochanteric femur fracture.              Action/Plan: Plan to discharge to SNF  Expected Discharge Date:                  Expected Discharge Plan:  [place]  In-House Referral:  Clinical Social Work  Discharge planning Services  CM Consult  Post Acute Care Choice:    Choice offered to:     DME Arranged:    DME Agency:     HH Arranged:    [place] Agency:     Status of Service:  Completed, signed off  If discussed at [employer], dates discussed:    Additional CommentsPurcell Mouton, RN 11/25/2016, 1:09 PM

## 2016-11-25 NOTE — Clinical Social Work Note (Signed)
Clinical Social Work Assessment  Patient Details  Name: Erika Day MRN: [id number] Date of Birth: [birth year]  Date of referral:  11/25/16               Reason for consult:  Facility Placement, Discharge Planning                Permission sought to share information with:  Family Supports, Customer service manager, Case Optician, dispensing granted to share information::  Yes, Verbal Permission Granted  Name::      (Johnny Engineer, petroleum and Science writer )  Agency::   (SNF )  Relationship::   (Spouse and daughter )  Contact Information:   [phone])  Housing/Transportation Living arrangements for the past 2 months:  [place] of Information:  Patient Patient Interpreter Needed:  None Criminal Activity/Legal Involvement Pertinent to Current Situation/Hospitalization:  No - Comment as needed Significant Relationships:  Adult Children, Spouse Lives with:  Spouse Do you feel safe going back to the place where you live?  No Need for family participation in patient care:  Yes (Comment)  Care giving concerns:  Requiring SNF placement.    Social Worker assessment / plan:  MSW spoke with patient's dtr, [name] and spouse, [name] in reference to post-acute placement for SNF. MSW introduced MSW role and SNF process. Pt's family reported being familiar with [facility] and SNF process. Patient was a Copy resident of [place] in Sept of 2017. Family asked questions in regards to anticipated dc date. No further concerns reported at this time. MSW remains available as needed.   Employment status:  Retired Nurse, adult PT Recommendations:  [place] / Referral to community resources:  [place]  Patient/Family's Response to care:  Patient asleep during assessment. Pt's family agreeable to SNF placement and prefers [facility]. Pt's family supportive and involved in care planning. Patient's  family appreciated sw intervention.   Patient/Family's Understanding of and Emotional Response to Diagnosis, Current Treatment, and Prognosis:  Patient's family knowledgeable of medical interventions.   Emotional Assessment Appearance:  Appears stated age Attitude/Demeanor/Rapport:  Unable to Assess Affect (typically observed):  Unable to Assess Orientation:  Oriented to Self, Oriented to Place, Oriented to  Time, Oriented to Situation Alcohol / Substance use:  Not Applicable Psych involvement (Current and /or in the community):  No (Comment)  Discharge Needs  Concerns to be addressed:  Denies Needs/Concerns at this time Readmission within the last 30 days:  No Current discharge risk:  Dependent with Mobility Barriers to Discharge:  Continued Medical Work up   Glendon Axe A 11/25/2016, 4:42 PM

## 2016-11-25 NOTE — Clinical Social Work Placement (Signed)
   CLINICAL SOCIAL WORK PLACEMENT  NOTE  Date:  11/25/2016  Patient Details  Name: Erika Day MRN: [id number] Date of Birth: Jun 04, 1930  Clinical Social Work is seeking post-discharge placement for this patient at the [place] level of care (*CSW will initial, date and re-position this form in  chart as items are completed):  Yes   Patient/family provided with [place] Work Department's list of facilities offering this level of care within the geographic area requested by the patient (or if unable, by the patient's family).  Yes   Patient/family informed of their freedom to choose among providers that offer the needed level of care, that participate in Medicare, Medicaid or managed care program needed by the patient, have an available bed and are willing to accept the patient.  Yes   Patient/family informed of [place]'s ownership interest in [facility] and [facility], as well as of the fact that they are under no obligation to receive care at these facilities.  PASRR submitted to EDS on 11/25/16     PASRR number received on 11/25/16     Existing PASRR number confirmed on       FL2 transmitted to all facilities in geographic area requested by pt/family on 11/25/16     FL2 transmitted to all facilities within larger geographic area on       Patient informed that his/her managed care company has contracts with or will negotiate with certain facilities, including the following:            Patient/family informed of bed offers received.  Patient chooses bed at       Physician recommends and patient chooses bed at      Patient to be transferred to   on  .  Patient to be transferred to facility by       Patient family notified on   of transfer.  Name of family member notified:        PHYSICIAN Please sign FL2     Additional Comment:    _______________________________________________ Glendon Axe A 11/25/2016, 4:39  PM

## 2016-11-26 DIAGNOSIS — W19XXXA Unspecified fall, initial encounter: Secondary | ICD-10-CM

## 2016-11-26 DIAGNOSIS — D62 Acute posthemorrhagic anemia: Secondary | ICD-10-CM

## 2016-11-26 LAB — CBC
HCT: 29.4 % — ABNORMAL LOW (ref 36.0–46.0)
Hemoglobin: 9.7 g/dL — ABNORMAL LOW (ref 12.0–15.0)
MCH: 28.1 pg (ref 26.0–34.0)
MCHC: 33 g/dL (ref 30.0–36.0)
MCV: 85.2 fL (ref 78.0–100.0)
Platelets: 105 10*3/uL — ABNORMAL LOW (ref 150–400)
RBC: 3.45 MIL/uL — ABNORMAL LOW (ref 3.87–5.11)
RDW: 14.8 % (ref 11.5–15.5)
WBC: 4.4 10*3/uL (ref 4.0–10.5)

## 2016-11-26 LAB — BASIC METABOLIC PANEL
ANION GAP: 5 (ref 5–15)
BUN: 26 mg/dL — AB (ref 6–20)
CALCIUM: 8.4 mg/dL — AB (ref 8.9–10.3)
CO2: 23 mmol/L (ref 22–32)
Chloride: 104 mmol/L (ref 101–111)
Creatinine, Ser: 0.81 mg/dL (ref 0.44–1.00)
GFR calc Af Amer: >60 mL/min (ref >60)
GLUCOSE: 115 mg/dL — AB (ref 65–99)
Potassium: 4.8 mmol/L (ref 3.5–5.1)
SODIUM: 132 mmol/L — AB (ref 135–145)

## 2016-11-26 LAB — TYPE AND SCREEN
BLOOD PRODUCT EXPIRATION DATE: 201802042359
Blood Product Expiration Date: 201802062359
ISSUE DATE / TIME: 201801241618
ISSUE DATE / TIME: 201801242045
UNIT TYPE AND RH: 6200
Unit Type and Rh: 6200

## 2016-11-26 MED ORDER — METHOCARBAMOL 500 MG PO TABS: 500.0000 mg | ORAL_TABLET | Freq: Four times a day (QID) | ORAL | 0 refills | Status: DC | PRN

## 2016-11-26 MED ORDER — ENOXAPARIN SODIUM 40 MG/0.4ML ~~LOC~~ SOLN: 40.0000 mg | Freq: Every day | SUBCUTANEOUS | 0 refills | Status: DC

## 2016-11-26 MED ORDER — HYDROCODONE-ACETAMINOPHEN 5-325 MG PO TABS: 1.0000 | ORAL_TABLET | Freq: Four times a day (QID) | ORAL | 0 refills | Status: DC | PRN

## 2016-11-26 NOTE — Progress Notes (Signed)
Subjective: 3 Days Post-Op Procedure(s) (LRB): INTRAMEDULLARY (IM) NAIL FEMORAL (Left) Patient reports pain as mild.   Patient seen in rounds with Dr. Wynelle Link.  Blood yesterday.  Feels better today. Patient is well, but has had some minor complaints of pain in the hip, requiring pain medications Plan is to go Skilled nursing facility after hospital stay.  Objective: Vital signs in last 24 hours: Temp:  [97.8 F (36.6 C)-99.9 F (37.7 C)] 99.3 F (37.4 C) (01/25 0831) Pulse Rate:  [63-105] 69 (01/25 0831) Resp:  [16-24] 20 (01/25 0831) BP: (99-139)/(45-77) 128/66 (01/25 0831) SpO2:  [93 %-98 %] 97 % (01/25 0831)  Intake/Output from previous day:  Intake/Output Summary (Last 24 hours) at 11/26/16 0922 Last data filed at 11/25/16 2345  Gross per 24 hour  Intake             1695 ml  Output              150 ml  Net             1545 ml    Intake/Output this shift: No intake/output data recorded.  Labs:  Recent Labs  11/23/16 2049 11/24/16 0501 11/25/16 0536 11/26/16 0518  HGB 9.9* 8.2* 7.6* 9.7*    Recent Labs  11/25/16 0536 11/26/16 0518  WBC 3.9* 4.4  RBC 2.76* 3.45*  HCT 23.3* 29.4*  PLT 112* 105*    Recent Labs  11/25/16 0536 11/26/16 0518  NA 136 132*  K 4.0 4.8  CL 104 104  CO2 23 23  BUN 26* 26*  CREATININE 1.02* 0.81  GLUCOSE 133* 115*  CALCIUM 8.7* 8.4*   No results for input(s): LABPT, INR in the last 72 hours.  EXAM General - Patient is Alert and Appropriate Extremity - Neurovascular intact Sensation intact distally Dressing/Incision - clean, dry, no drainage Motor Function - intact, moving foot and toes well on exam.   Past Medical History:  Diagnosis Date  . Acid reflux disease   . Cancer [facility])    Looking for documentation of this  . CLL (chronic lymphocytic leukemia) ([place]) 05/15/2016  . DDD (degenerative disc disease), lumbar   . Degenerative joint disease   . Diabetes mellitus without complication ([place])   . Dyslipidemia     . Hypertension   . 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 Panic attacks   . Pneumonia 02/2016  . 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   Weight as of this encounter: 70 kg (154 lb 5.2 oz). Up with therapy Discharge to SNF when okay form Medical Standpoint Okay from ortho standpoint  DVT Prophylaxis - Take Lovenox injections for two weeks, then discontinue the Lovenox injections.  After the injections completed, then take a baby Aspirin 81 mg daily for three additional weeks. Weight Bearing As Tolerated left Leg Follow up in two weeks with Dr. Mare Ferrari RXs will be placed  onto chart.  Arlee Muslim, PA-C Orthopaedic Surgery 11/26/2016, 9:22 AM

## 2016-11-26 NOTE — Clinical Social Work Placement (Signed)
Patient is set to discharge to [facility] today. Patient & husband, [name] made aware. Discharge packet given to RN, Tess. [facility] called for transport to pickup at 2:00pm.     Raynaldo Opitz, [place] Social Worker cell #: [phone]    CLINICAL SOCIAL WORK PLACEMENT  NOTE  Date:  11/26/2016  Patient Details  Name: Erika Day MRN: [id number] Date of Birth: 14-Dec-1929  Clinical Social Work is seeking post-discharge placement for this patient at the [place] level of care (*CSW will initial, date and re-position this form in  chart as items are completed):  Yes   Patient/family provided with [place] Work Department's list of facilities offering this level of care within the geographic area requested by the patient (or if unable, by the patient's family).  Yes   Patient/family informed of their freedom to choose among providers that offer the needed level of care, that participate in Medicare, Medicaid or managed care program needed by the patient, have an available bed and are willing to accept the patient.  Yes   Patient/family informed of [place]'s ownership interest in [facility] and [facility], as well as of the fact that they are under no obligation to receive care at these facilities.  PASRR submitted to EDS on 11/25/16     PASRR number received on 11/25/16     Existing PASRR number confirmed on       FL2 transmitted to all facilities in geographic area requested by pt/family on 11/25/16     FL2 transmitted to all facilities within larger geographic area on       Patient informed that his/her managed care company has contracts with or will negotiate with certain facilities, including the following:        Yes   Patient/family informed of bed offers received.  Patient chooses bed at [id number]     Physician recommends and patient chooses bed at      Patient to be  transferred to [facility] on 11/26/16.  Patient to be transferred to facility by [facility]     Patient family notified on 11/26/16 of transfer.  Name of family member notified:  patient's husband, Johnny via phone     PHYSICIAN       Additional Comment:    _______________________________________________ Standley Brooking, LCSW 11/26/2016, 10:24 AM

## 2016-11-26 NOTE — Discharge Instructions (Signed)
Hip Fracture A hip fracture is a fracture of the upper part of your thigh bone (femur). What are the causes? A hip fracture is caused by a direct blow to the side of your hip. This is usually the result of a fall but can occur in other circumstances, such as an automobile accident. What increases the risk? There is an increased risk of hip fractures in people with:  An unsteady walking pattern (gait) and those with conditions that contribute to poor balance, such as Parkinson's disease or dementia.  Osteopenia and osteoporosis.  Cancer that spreads to the leg bones.  Certain metabolic diseases. What are the signs or symptoms? Symptoms of hip fracture include:  Pain over the injured hip.  Inability to put weight on the leg in which the fracture occurred (although, some patients are able to walk after a hip fracture).  Toes and foot of the affected leg point outward when you lie down. How is this diagnosed? A physical exam can determine if a hip fracture is likely to have occurred. X-ray exams are needed to confirm the fracture and to look for other injuries. The X-ray exam can help to determine the type of hip fracture. Rarely, the fracture is not visible on an X-ray image and a CT scan or MRI will have to be done. How is this treated? The treatment for a fracture is usually surgery. This means using a screw, nail, or rod to hold the bones in place. Follow these instructions at home: Take all medicines as directed by your health care provider. Contact a health care provider if: Pain continues, even after taking pain medicine. This information is not intended to replace advice given to you by your health care provider. Make sure you discuss any questions you have with your health care provider. Document Released: 10/19/2005 Document Revised: 03/26/2016 Document Reviewed: 05/31/2013 Elsevier Interactive Patient Education  2017 [place]    Dr. Gaynelle Arabian Total Joint  Specialist [facility] [id number], [place], [place] [address] [phone]  HIP REPLACEMENT POSTOPERATIVE DIRECTIONS  Hip Rehabilitation, Guidelines Following Surgery  The results of a hip operation are greatly improved after range of motion and muscle strengthening exercises. Follow all safety measures which are given to protect your hip. If any of these exercises cause increased pain or swelling in your joint, decrease the amount until you are comfortable again. Then slowly increase the exercises. Call your caregiver if you have problems or questions.   HOME CARE INSTRUCTIONS  Remove items at home which could result in a fall. This includes throw rugs or furniture in walking pathways.   ICE to the affected hip every three hours for 30 minutes at a time and then as needed for pain and swelling.  Continue to use ice on the hip for pain and swelling from surgery. You may notice swelling that will progress down to the foot and ankle.  This is normal after surgery.  Elevate the leg when you are not up walking on it.    Continue to use the breathing machine which will help keep your temperature down.  It is common for your temperature to cycle up and down following surgery, especially at night when you are not up moving around and exerting yourself.  The breathing machine keeps your lungs expanded and your temperature down.  DIET You may resume your previous home diet once your are discharged from the hospital.  DRESSING / WOUND CARE / SHOWERING You may shower 3  days after surgery, but keep the wounds dry during showering.  You may use an occlusive plastic wrap (Press'n Seal for example), NO SOAKING/SUBMERGING IN THE BATHTUB.  If the bandage gets wet, change with a clean dry gauze.  If the incision gets wet, pat the wound dry with a clean towel. You may start showering once you are discharged home but do not submerge the incision under water. Just pat the incision dry and  apply a dry gauze dressing on daily. Change the surgical dressing daily and reapply a dry dressing each time.    ACTIVITY Walk with your walker as instructed. Use walker as long as suggested by your caregivers. Avoid periods of inactivity such as sitting longer than an hour when not asleep. This helps prevent blood clots.  You may resume a sexual relationship in one month or when given the OK by your doctor.  You may return to work once you are cleared by your doctor.  Do not drive a car for 6 weeks or until released by you surgeon.  Do not drive while taking narcotics.  WEIGHT BEARING Weight bearing as tolerated with assist device (walker, cane, etc) as directed, use it as long as suggested by your surgeon or therapist, typically at least 4-6 weeks.  POSTOPERATIVE CONSTIPATION PROTOCOL Constipation - defined medically as fewer than three stools per week and severe constipation as less than one stool per week.  One of the most common issues patients have following surgery is constipation.  Even if you have a regular bowel pattern at home, your normal regimen is likely to be disrupted due to multiple reasons following surgery.  Combination of anesthesia, postoperative narcotics, change in appetite and fluid intake all can affect your bowels.  In order to avoid complications following surgery, here are some recommendations in order to help you during your recovery period.  Colace (docusate) - Pick up an over-the-counter form of Colace or another stool softener and take twice a day as long as you are requiring postoperative pain medications.  Take with a full glass of water daily.  If you experience loose stools or diarrhea, hold the colace until you stool forms back up.  If your symptoms do not get better within 1 week or if they get worse, check with your doctor.  Dulcolax (bisacodyl) - Pick up over-the-counter and take as directed by the product packaging as needed to assist with the movement  of your bowels.  Take with a full glass of water.  Use this product as needed if not relieved by Colace only.   MiraLax (polyethylene glycol) - Pick up over-the-counter to have on hand.  MiraLax is a solution that will increase the amount of water in your bowels to assist with bowel movements.  Take as directed and can mix with a glass of water, juice, soda, coffee, or tea.  Take if you go more than two days without a movement. Do not use MiraLax more than once per day. Call your doctor if you are still constipated or irregular after using this medication for 7 days in a row.  If you continue to have problems with postoperative constipation, please contact the office for further assistance and recommendations.  If you experience "the worst abdominal pain ever" or develop nausea or vomiting, please contact the office immediatly for further recommendations for treatment.  ITCHING  If you experience itching with your medications, try taking only a single pain pill, or even half a pain pill at a time.  You can also use Benadryl over the counter for itching or also to help with sleep.   TED HOSE STOCKINGS Wear the elastic stockings on both legs for three weeks following surgery during the day but you may remove then at night for sleeping.  MEDICATIONS See your medication summary on the After Visit Summary that the nursing staff will review with you prior to discharge.  You may have some home medications which will be placed on hold until you complete the course of blood thinner medication.  It is important for you to complete the blood thinner medication as prescribed by your surgeon.  Continue your approved medications as instructed at time of discharge.  PRECAUTIONS If you experience chest pain or shortness of breath - call 911 immediately for transfer to the hospital emergency department.  If you develop a fever greater that 101 F, purulent drainage from wound, increased redness or drainage from  wound, foul odor from the wound/dressing, or calf pain - CONTACT YOUR SURGEON.                                                   FOLLOW-UP APPOINTMENTS Make sure you keep all of your appointments after your operation with your surgeon and caregivers. You should call the office at the above phone number and make an appointment for approximately two weeks after the date of your surgery or on the date instructed by your surgeon outlined in the "After Visit Summary".  RANGE OF MOTION AND STRENGTHENING EXERCISES  These exercises are designed to help you keep full movement of your hip joint. Follow your caregiver's or physical therapist's instructions. Perform all exercises about fifteen times, three times per day or as directed. Exercise both hips, even if you have had only one joint replacement. These exercises can be done on a training (exercise) mat, on the floor, on a table or on a bed. Use whatever works the best and is most comfortable for you. Use music or television while you are exercising so that the exercises are a pleasant break in your day. This will make your life better with the exercises acting as a break in routine you can look forward to.  Lying on your back, slowly slide your foot toward your buttocks, raising your knee up off the floor. Then slowly slide your foot back down until your leg is straight again.  Lying on your back spread your legs as far apart as you can without causing discomfort.  Lying on your side, raise your upper leg and foot straight up from the floor as far as is comfortable. Slowly lower the leg and repeat.  Lying on your back, tighten up the muscle in the front of your thigh (quadriceps muscles). You can do this by keeping your leg straight and trying to raise your heel off the floor. This helps strengthen the largest muscle supporting your knee.  Lying on your back, tighten up the muscles of your buttocks both with the legs straight and with the knee bent at a  comfortable angle while keeping your heel on the floor.      IF YOU ARE TRANSFERRED TO A SKILLED REHAB FACILITY If the patient is transferred to a skilled rehab facility following release from the hospital, a list of the current medications will be sent to the facility for the patient  to continue.  When discharged from the skilled rehab facility, please have the facility set up the patient's [place] prior to being released. Also, the skilled facility will be responsible for providing the patient with their medications at time of release from the facility to include their pain medication, the muscle relaxants, and their blood thinner medication. If the patient is still at the rehab facility at time of the two week follow up appointment, the skilled rehab facility will also need to assist the patient in arranging follow up appointment in our office and any transportation needs.  MAKE SURE YOU:  Understand these instructions.  Get help right away if you are not doing well or get worse.    Pick up stool softner and laxative for home use following surgery while on pain medications. Do not submerge incision under water. Please use good hand washing techniques while changing dressing each day. May shower starting three days after surgery. Please use a clean towel to pat the incision dry following showers. Continue to use ice for pain and swelling after surgery. Do not use any lotions or creams on the incision until instructed by your surgeon.3  Take Lovenox injections for two weeks, then discontinue the Lovenox injections.  After the injections completed, then resume the baby 81 mg Aspirin daily.

## 2016-11-26 NOTE — Discharge Summary (Signed)
Physician Discharge Summary  Erika Day [id number] DOB: September 09, 1930 DOA: 11/22/2016  PCP: Philis Fendt, MD  Admit date: 11/22/2016 Discharge date: 11/26/2016  Admitted From: home Disposition:  SNF   Recommendations for Outpatient Follow-up:  1. 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   Brief Summary: 83 y.o.femalewith medical history significant of CLL, lymphoma, DM2, HTN, HLD,CVA; who presents after having fallen while trying to transfer from her wheelchair. Admitted for left hip fracture.  Hospital Course:   Fall with Left hip fracture - Acute. Patient fell out of wheelchair. X-rays revealed acute left hip fracture. -Surgical repair by Dr. Janice Norrie 1/22 with IM nail - PT recommended SNF placement  Anemia: - Hemoglobin 10.2 has dropped to 7.6- Ortho has transfused 2 u PRBC- Hb has risen to 9.7 - will need iron supplements on discharge  - Continue to monitor  Thrombocytopenia:  - Stable.Platelet count noted to be 138 on admission. This appears near the patient's baseline. - Continue to monitor   History of lymphoma:  - Patient followed by Dr.Goursch- currently on chemotherapy with last treatment 11/10.  Hypotension with h/o Hypertension - discontinued antihypertensive medication.  Mild AKI - possibly due to acute blood loss and hypotension- follow - ACE on hold  Gout - Allopurinol  Discharge Instructions  Discharge Instructions    Call MD / Call 911    Complete by:  As directed    If you  experience chest pain or shortness of breath, CALL 911 and be transported to the hospital emergency room.  If you develope a fever above 101 F, pus (white drainage) or increased drainage or redness at the wound, or calf pain, call your surgeon's office.   Change dressing    Complete by:  As directed    You may change your dressing dressing daily with sterile 4 x 4 inch gauze dressing and paper tape.  Do not submerge the incision under water.   Constipation Prevention    Complete by:  As directed    Drink plenty of fluids.  Prune juice may be helpful.  You may use a stool softener, such as Colace (over the counter) 100 mg twice a day.  Use MiraLax (over the counter) for constipation as needed.   Diet - low sodium heart healthy    Complete by:  As directed    Diet - low sodium heart healthy    Complete by:  As directed    Discharge instructions    Complete by:  As directed    Pick up stool softner and laxative for home use following surgery while on pain medications. Do not submerge incision under water. Please use good hand washing techniques while changing dressing each day. May shower starting three days after surgery. Please use a clean towel to pat the incision dry following showers. Continue to use ice for pain and swelling after surgery. Do not use any lotions or creams on the incision until instructed by your surgeon.  Wear both TED hose on both legs during the  day every day for three weeks, but may have off at night at home.  Postoperative Constipation Protocol  Constipation - defined medically as fewer than three stools per week and severe constipation as less than one stool per week.  One of the most common issues patients have following surgery is constipation.  Even if you have a regular bowel pattern at home, your normal regimen is likely to be disrupted due to multiple reasons following surgery.  Combination of anesthesia, postoperative narcotics, change in appetite and fluid  intake all can affect your bowels.  In order to avoid complications following surgery, here are some recommendations in order to help you during your recovery period.  Colace (docusate) - Pick up an over-the-counter form of Colace or another stool softener and take twice a day as long as you are requiring postoperative pain medications.  Take with a full glass of water daily.  If you experience loose stools or diarrhea, hold the colace until you stool forms back up.  If your symptoms do not get better within 1 week or if they get worse, check with your doctor.  Dulcolax (bisacodyl) - Pick up over-the-counter and take as directed by the product packaging as needed to assist with the movement of your bowels.  Take with a full glass of water.  Use this product as needed if not relieved by Colace only.   MiraLax (polyethylene glycol) - Pick up over-the-counter to have on hand.  MiraLax is a solution that will increase the amount of water in your bowels to assist with bowel movements.  Take as directed and can mix with a glass of water, juice, soda, coffee, or tea.  Take if you go more than two days without a movement. Do not use MiraLax more than once per day. Call your doctor if you are still constipated or irregular after using this medication for 7 days in a row.  If you continue to have problems with postoperative constipation, please contact the office for further assistance and recommendations.  If you experience "the worst abdominal pain ever" or develop nausea or vomiting, please contact the office immediatly for further recommendations for treatment.   Take Lovenox injections for two weeks, then discontinue the Lovenox injections.  After the injections completed, then resume the  baby Aspirin 81 mg daily.   Do not sit on low chairs, stoools or toilet seats, as it may be difficult to get up from low surfaces    Complete by:  As directed    Driving restrictions    Complete by:  As directed    No  driving until released by the physician.   Increase activity slowly    Complete by:  As directed    Increase activity slowly as tolerated    Complete by:  As directed    Lifting restrictions    Complete by:  As directed    No lifting until released by the physician.   Patient may shower    Complete by:  As directed    You may shower without a dressing once there is no drainage.  Do not wash over the wound.  If drainage remains, do not shower until drainage stops.   TED hose    Complete by:  As directed    Use stockings (TED hose) for 3 weeks on both leg(s).  You may remove them at night for sleeping.   Weight bearing as tolerated    Complete by:  As directed  Laterality:  left   Extremity:  Lower     Allergies as of 11/26/2016      Reactions   Morphine And Related Other (See Comments)   Blood sugar dropped one-time      Medication List    STOP taking these medications   aspirin EC 81 MG tablet     TAKE these medications   CLARITIN PO Take 10 mg by mouth.   enoxaparin 40 MG/0.4ML injection Commonly known as:  LOVENOX Inject 0.4 mLs (40 mg total) into the skin daily. Take Lovenox injections for two weeks, then discontinue the Lovenox injections.  After the injections completed, then resume the baby Aspirin 81 mg daily.   feeding supplement (ENSURE ENLIVE) Liqd Take 237 mLs by mouth 2 (two) times daily between meals.   furosemide 20 MG tablet Commonly known as:  LASIX Take 20 mg by mouth daily as needed for fluid.   HYDROcodone-acetaminophen 5-325 MG tablet Commonly known as:  NORCO/VICODIN Take 1-2 tablets by mouth every 6 (six) hours as needed for moderate pain.   lisinopril 10 MG tablet Commonly known as:  PRINIVIL,ZESTRIL Take 10 mg by mouth daily.   LOTEMAX 0.5 % ophthalmic suspension Generic drug:  loteprednol Place 1 drop into the right eye daily.   methocarbamol 500 MG tablet Commonly known as:  ROBAXIN Take 1 tablet (500 mg total) by mouth every 6  (six) hours as needed for muscle spasms.   pantoprazole 40 MG tablet Commonly known as:  PROTONIX Take 1 tablet (40 mg total) by mouth daily.   polyethylene glycol packet Commonly known as:  MIRALAX / GLYCOLAX Take 17 g by mouth 2 (two) times daily.   sertraline 25 MG tablet Commonly known as:  ZOLOFT TAKE 1 TABLET BY MOUTH DAILY   traMADol 50 MG tablet Commonly known as:  ULTRAM Take 1 tablet (50 mg total) by mouth every 6 (six) hours as needed for moderate pain or severe pain.      Follow-up Information    Gearlean Alf, MD. Schedule an appointment as soon as possible for a visit on 12/08/2016.   Specialty:  Orthopedic Surgery Why:  Call office ASAP at [phone] to setup appointment on Tuesday 12/08/2016 with Dr. Wynelle Link. Contact information: [address] [place] [address] [phone]          Allergies  Allergen Reactions  . Morphine And Related Other (See Comments)    Blood sugar dropped one-time     Procedures/Studies: Intramedullary nailing,Leftintertrochanteric femur  fracture.   Dg Chest 2 View  Result Date: 11/23/2016 CLINICAL DATA:  83 y/o  F; status post fall. EXAM: CHEST  2 VIEW COMPARISON:  None. FINDINGS: Elevated right hemidiaphragm with colonic interposition over the liver. Linear opacities in lung bases is likely atelectasis. No focal consolidation identified. No pneumothorax. No pleural effusion. Aortic atherosclerosis with calcification. Multilevel degenerative changes of the spine. Stable cardiac silhouette. IMPRESSION: Stable elevated right hemidiaphragm and streaky bibasilar opacities probably representing atelectasis. Aortic atherosclerosis. Electronically Signed   By: Kristine Garbe M.D.   On: 11/23/2016 02:25   Dg Cervical Spine 1 View  Result Date: 11/23/2016 CLINICAL DATA:  Possible tooth aspiration after surgery EXAM: CERVICAL SPINE 1 VIEW COMPARISON:  None. FINDINGS: The patient's head and neck are turned  to the right. Endotracheal tube tip is seen to the level of the aortic arch. No radiopaque foreign body is seen. Elevated right hemidiaphragm with atelectasis. No pneumothorax. A molar tooth like density projects over the  left mandibular ramus which might correspond with the patient's missing tooth, seen in the oral cavity. Correlate. IMPRESSION: There is a molar tooth like density projecting adjacent to the left mandibular ramus, seen within the oral cavity. Correlate clinically. Electronically Signed   By: Ashley Royalty M.D.   On: 11/23/2016 22:34   Nm Pet Image Restag (ps) Skull Base To Thigh  Result Date: 11/13/2016 CLINICAL DATA:  Subsequent treatment strategy for lymphoma. Low-grade non-Hodgkin's B-cell lymphoma. Surgery for abdominal is obstruction demonstrated lymphoma involvement of the GI tract. EXAM: NUCLEAR MEDICINE PET SKULL BASE TO THIGH TECHNIQUE: 6.0 mCi F-18 FDG was injected intravenously. Full-ring PET imaging was performed from the skull base to thigh after the radiotracer. CT data was obtained and used for attenuation correction and anatomic localization. FASTING BLOOD GLUCOSE:  Value: 75 mg/dl COMPARISON:  PET-CT 05/25/2016 FINDINGS: NECK No hypermetabolic lymph nodes in the neck. Asymmetric hypermetabolic muscle activity in the RIGHT neck is favored physiologic. Uptake in LEFT and RIGHT lobe of thyroid gland (greater on the LEFT) similar to comparison exam. CHEST Persistent hypermetabolic precarinal lymph node with SUV max equal 4.2 compared to 3.9. Lymph node unchanged in size at 1.5 cm. RIGHT axial lymph node measuring 1.0 cm with SUV max equal 1.8 decreased from 2.6. 8 mm LEFT axial lymph node is decreased in metabolic activity and size from 11 mm on comparison exam with no significant activity. ABDOMEN/PELVIS No abnormal hypermetabolic activity within the liver, pancreas, adrenal glands, or spleen. No abnormal activity associated with the colon or small bowel. Atherosclerotic  calcification of the aorta. Mild activity in the RIGHT inguinal lymph node with SUV max equal 2.6 compares to 2.9 on prior. No change in size. SKELETON No focal hypermetabolic activity to suggest skeletal metastasis. IMPRESSION: 1. No evidence disease progression. 2. Mild decrease in metabolic activity in axillary lymph nodes. 3. Similar metabolic activity of small mediastinal lymph nodes [place] 3 . 4. Similar activity in inguinal lymph nodes. 5. No abnormal bowel activity identified. Electronically Signed   By: Suzy Bouchard M.D.   On: 11/13/2016 10:36   Dg Chest Port 1 View  Result Date: 11/23/2016 CLINICAL DATA:  Possible tooth aspiration status post surgery. EXAM: PORTABLE CHEST 1 VIEW COMPARISON:  11/23/2016 CXR at 1:16 p.m., chest CT 02/13/2016 the FINDINGS: Endotracheal tube tip is seen 3.3 cm above the carina in satisfactory position. No radiopaque foreign bodies seen along the endotracheal tube. Elevated right hemidiaphragm with decrease in atelectasis since prior. No pneumonic consolidation. Colonic interposition over the right liver shadow. Cholecystectomy clips in the right upper quadrant. No acute osseous abnormality. IMPRESSION: Endotracheal tube tip in satisfactory position. No radiopaque foreign body identified. Chronically elevated right hemidiaphragm with slight decrease in bibasilar atelectasis. Electronically Signed   By: Ashley Royalty M.D.   On: 11/23/2016 22:29   Dg C-arm 1-60 Min-no Report  Result Date: 11/23/2016 There is no Radiologist interpretation  for this exam.  Dg Hip Operative Unilat W Or W/o Pelvis Left  Result Date: 11/23/2016 CLINICAL DATA:  Left hip fracture EXAM: OPERATIVE LEFT HIP WITH PELVIS COMPARISON:  11/23/2016 FLUOROSCOPY TIME:  Radiation Exposure Index (as provided by the fluoroscopic device): 2.49 mGy If the device does not provide the exposure index: Fluoroscopy Time:  32 seconds Number of Acquired Images:  3 FINDINGS: Medullary rod and proximal fixation  screw are noted on the left. Fracture fragments are in near anatomic alignment. IMPRESSION: Status post ORIF of proximal left femoral fracture Electronically Signed   By: Elta Guadeloupe  Lukens M.D.   On: 11/23/2016 21:51   Dg Hip Unilat W Or Wo Pelvis 2-3 Views Left  Result Date: 11/23/2016 CLINICAL DATA:  Pain after fall. EXAM: DG HIP (WITH OR WITHOUT PELVIS) 2-3V LEFT COMPARISON:  Abdomen pelvic CT from 06/27/2016 FINDINGS: Nondisplaced acute intertrochanteric fracture of the left femur with avulsed lesser trochanter. The native right hip is maintained. There is osteoarthritic sclerosis about both SI joints with degenerative disc disease of L4-5 and L5-S1. The bony pelvis appears intact. IMPRESSION: Acute left intertrochanteric fracture of the left femur with avulsed lesser trochanter. Degenerative disc disease L4 through S1. 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 No acute pelvic fracture. Electronically Signed   By: Ashley Royalty M.D.   On: 11/23/2016 02:25       Discharge Exam: Vitals:   11/26/16 0436 11/26/16 0831  BP: 111/77 128/66  Pulse: 93 69  Resp: (!) 24 20  Temp: 98.6 F (37 C) 99.3 F (37.4 C)   Vitals:   11/25/16 2345 11/26/16 0215 11/26/16 0436 11/26/16 0831  BP: 120/60 (!) 104/59 111/77 128/66  Pulse: 97 (!) 105 93 69  Resp:  18 (!) 24 20  Temp: 99.4 F (37.4 C) 99.9 F (37.7 C) 98.6 F (37 C) 99.3 F (37.4 C)  TempSrc: Oral Oral Oral Axillary  SpO2: 94% 93% 96% 97%  Weight:      Height:        General: Pt is alert, awake, not in acute distress Cardiovascular: RRR, S1/S2 +, no rubs, no gallops Respiratory: CTA bilaterally, no wheezing, no rhonchi Abdominal: Soft, NT, ND, bowel sounds + Extremities: no edema, no cyanosis    The results of significant diagnostics from this hospitalization (including imaging, microbiology, ancillary and laboratory) are listed below for reference.     Microbiology: Recent Results (from the past 240 hour(s))  Surgical PCR  screen     Status: None   Collection Time: 11/23/16  7:40 AM  Result Value Ref Range Status   MRSA, PCR NEGATIVE NEGATIVE Final   Staphylococcus aureus NEGATIVE NEGATIVE Final    Comment:        The Xpert SA Assay (FDA approved for NASAL specimens in patients over 88 years of age), is one component of a comprehensive surveillance program.  Test performance has been validated by [facility] for patients greater than or equal to 30 year old. It is not intended to diagnose infection nor to guide or monitor treatment.      Labs: BNP (last 3 results)  Recent Labs  02/13/16 0350  BNP [id number]*   Basic Metabolic Panel:  Recent Labs Lab 11/23/16 0125 11/23/16 0629 11/23/16 2049 11/24/16 0501 11/25/16 0536 11/26/16 0518  NA 139 136 136 134* 136 132*  K 4.7 4.1 4.8 4.5 4.0 4.8  CL 102 103  --  104 104 104  CO2  --  26  --  23 23 23   GLUCOSE 130* 157* 107* 153* 133* 115*  BUN 10 12  --  12 26* 26*  CREATININE 0.80 0.73  --  0.86 1.02* 0.81  CALCIUM  --  9.3  --  8.7* 8.7* 8.4*   Liver Function Tests: No results for input(s): AST, ALT, ALKPHOS, BILITOT, PROT, ALBUMIN in the last 168 hours. No results for input(s): LIPASE, AMYLASE in the last 168 hours. No results for input(s): AMMONIA in the last 168 hours. CBC:  Recent Labs Lab 11/23/16 0030  11/23/16 [id number] 11/23/16 2049 11/24/16 0501 11/25/16 0536 11/26/16 0518  WBC 4.3  --  3.8*  --  4.2 3.9* 4.4  NEUTROABS 2.8  --   --   --   --   --   --   HGB 10.2*  < > 9.2* 9.9* 8.2* 7.6* 9.7*  HCT 31.6*  < > 28.1* 29.0* 24.9* 23.3* 29.4*  MCV 82.5  --  82.9  --  83.3 84.4 85.2  PLT 138*  --  134*  --  118* 112* 105*  < > = values in this interval not displayed. Cardiac Enzymes: No results for input(s): CKTOTAL, CKMB, CKMBINDEX, TROPONINI in the last 168 hours. BNP: Invalid input(s): POCBNP CBG:  Recent Labs Lab 11/23/16 2216  GLUCAP 101*   D-Dimer No results for input(s): DDIMER in the last 72 hours. Hgb  A1c No results for input(s): HGBA1C in the last 72 hours. Lipid Profile No results for input(s): CHOL, HDL, LDLCALC, TRIG, CHOLHDL, LDLDIRECT in the last 72 hours. Thyroid function studies No results for input(s): TSH, T4TOTAL, T3FREE, THYROIDAB in the last 72 hours.  Invalid input(s): FREET3 Anemia work up No results for input(s): VITAMINB12, FOLATE, FERRITIN, TIBC, IRON, RETICCTPCT in the last 72 hours. Urinalysis    Component Value Date/Time   COLORURINE YELLOW 06/28/2016 0652   APPEARANCEUR CLEAR 06/28/2016 0652   LABSPEC 1.044 (H) 06/28/2016 0652   PHURINE 6.0 06/28/2016 0652   GLUCOSEU NEGATIVE 06/28/2016 0652   HGBUR NEGATIVE 06/28/2016 0652   BILIRUBINUR NEGATIVE 06/28/2016 0652   KETONESUR NEGATIVE 06/28/2016 0652   PROTEINUR NEGATIVE 06/28/2016 0652   UROBILINOGEN 0.2 05/17/2015 2307   NITRITE NEGATIVE 06/28/2016 0652   LEUKOCYTESUR NEGATIVE 06/28/2016 [id number]   Sepsis Labs Invalid input(s): PROCALCITONIN,  WBC,  LACTICIDVEN Microbiology Recent Results (from the past 240 hour(s))  Surgical PCR screen     Status: None   Collection Time: 11/23/16  7:40 AM  Result Value Ref Range Status   MRSA, PCR NEGATIVE NEGATIVE Final   Staphylococcus aureus NEGATIVE NEGATIVE Final    Comment:        The Xpert SA Assay (FDA approved for NASAL specimens in patients over 22 years of age), is one component of a comprehensive surveillance program.  Test performance has been validated by [facility] for patients greater than or equal to 61 year old. It is not intended to diagnose infection nor to guide or monitor treatment.      Time coordinating discharge: Over 30 minutes  SIGNED:   Debbe Odea, MD  [facility] 11/26/2016, 9:34 AM Pager   If 7PM-7AM, please contact night-coverage [email or web address] Password [email or web address]

## 2017-02-12 ENCOUNTER — Other Ambulatory Visit (HOSPITAL_BASED_OUTPATIENT_CLINIC_OR_DEPARTMENT_OTHER): Payer: Medicare Other

## 2017-02-12 ENCOUNTER — Telehealth: Payer: Self-pay | Admitting: Hematology and Oncology

## 2017-02-12 ENCOUNTER — Ambulatory Visit (HOSPITAL_BASED_OUTPATIENT_CLINIC_OR_DEPARTMENT_OTHER): Payer: Medicare Other | Admitting: Hematology and Oncology

## 2017-02-12 ENCOUNTER — Encounter: Payer: Self-pay | Admitting: Hematology and Oncology

## 2017-02-12 ENCOUNTER — Other Ambulatory Visit: Payer: Self-pay | Admitting: Hematology and Oncology

## 2017-02-12 VITALS — BP 227/104 | HR 96 | Temp 98.3°F | Resp 17 | Ht 65.0 in

## 2017-02-12 DIAGNOSIS — D72819 Decreased white blood cell count, unspecified: Secondary | ICD-10-CM

## 2017-02-12 DIAGNOSIS — D61818 Other pancytopenia: Secondary | ICD-10-CM

## 2017-02-12 DIAGNOSIS — I10 Essential (primary) hypertension: Secondary | ICD-10-CM | POA: Diagnosis not present

## 2017-02-12 DIAGNOSIS — C8584 Other specified types of non-Hodgkin lymphoma, lymph nodes of axilla and upper limb: Secondary | ICD-10-CM

## 2017-02-12 DIAGNOSIS — C8304 Small cell B-cell lymphoma, lymph nodes of axilla and upper limb: Secondary | ICD-10-CM

## 2017-02-12 LAB — CBC WITH DIFFERENTIAL/PLATELET
BASO%: 0.7 % (ref 0.0–2.0)
BASOS ABS: 0 10*3/uL (ref 0.0–0.1)
EOS ABS: 0.1 10*3/uL (ref 0.0–0.5)
EOS%: 4.5 % (ref 0.0–7.0)
HCT: 41.1 % (ref 34.8–46.6)
HGB: 13.6 g/dL (ref 11.6–15.9)
LYMPH%: 48.1 % (ref 14.0–49.7)
MCH: 27.8 pg (ref 25.1–34.0)
MCHC: 33 g/dL (ref 31.5–36.0)
MCV: 84.3 fL (ref 79.5–101.0)
MONO#: 0.2 10*3/uL (ref 0.1–0.9)
MONO%: 5.8 % (ref 0.0–14.0)
NEUT#: 1.1 10*3/uL — ABNORMAL LOW (ref 1.5–6.5)
NEUT%: 40.9 % (ref 38.4–76.8)
Platelets: 160 10*3/uL (ref 145–400)
RBC: 4.88 10*6/uL (ref 3.70–5.45)
RDW: 15.6 % — ABNORMAL HIGH (ref 11.2–14.5)
WBC: 2.6 10*3/uL — ABNORMAL LOW (ref 3.9–10.3)
lymph#: 1.3 10*3/uL (ref 0.9–3.3)

## 2017-02-12 LAB — COMPREHENSIVE METABOLIC PANEL
ALK PHOS: 119 U/L (ref 40–150)
ALT: 8 U/L (ref 0–55)
AST: 17 U/L (ref 5–34)
Albumin: 4.7 g/dL (ref 3.5–5.0)
Anion Gap: 13 mEq/L — ABNORMAL HIGH (ref 3–11)
BUN: 10.4 mg/dL (ref 7.0–26.0)
CALCIUM: 11.4 mg/dL — AB (ref 8.4–10.4)
CO2: 26 mEq/L (ref 22–29)
Chloride: 103 mEq/L (ref 98–109)
Creatinine: 0.9 mg/dL (ref 0.6–1.1)
EGFR: 68 mL/min/{1.73_m2} — AB (ref >90)
GLUCOSE: 107 mg/dL (ref 70–140)
POTASSIUM: 4.4 meq/L (ref 3.5–5.1)
SODIUM: 142 meq/L (ref 136–145)
Total Bilirubin: 0.74 mg/dL (ref 0.20–1.20)
Total Protein: 9.4 g/dL — ABNORMAL HIGH (ref 6.4–8.3)

## 2017-02-12 LAB — IRON AND TIBC
%SAT: 21 % (ref 21–57)
Iron: 63 ug/dL (ref 41–142)
TIBC: 298 ug/dL (ref 236–444)
UIBC: 235 ug/dL (ref 120–384)

## 2017-02-12 LAB — LACTATE DEHYDROGENASE: LDH: 240 U/L (ref 125–245)

## 2017-02-12 LAB — FERRITIN: Ferritin: 188 ng/ml (ref 9–269)

## 2017-02-12 NOTE — Assessment & Plan Note (Signed)
She has significant hypercalcemia of unknown etiology I am concerned about possible cancer recurrence I will order repeat blood work next week and recommend she avoid calcium rich diet

## 2017-02-12 NOTE — Telephone Encounter (Signed)
Gave patient AVS and calender per 4/13 los. Per Verbal order from Dr. Alvy Bimler - patient to come in next Monday 4/16 to get recheck on lab work drawn due to high levels today .

## 2017-02-12 NOTE — Progress Notes (Signed)
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[place])   05/18/2015 Imaging    Ct abdomen and pelvis showed reactive lymphadenopathy and stool filled colon      02/13/2016 Imaging    Ct chest showed pneumonia      05/08/2016 Imaging    screening mammogram revealed right axillary lymphadenopathy      05/12/2016 Procedure    She underwent core needle biopsy of axillary LN      05/12/2016 Pathology Results    [phone] Biopsy showed low grade non-Hodgkin B cell lymphoma, likely marginal zone lymphoma      05/15/2016 Pathology Results    Peripheral blood positive for deletion 13q      05/25/2016 PET scan    Mildly hypermetabolic mediastinal, left axillary and right inguinal lymph nodes. 2. Mild hypermetabolism in the left thyroid, nonspecific      06/27/2016 Imaging    Ct abdomen showed sigmoid volvulus resulting in large bowel obstruction. Splenomegaly, in keeping with patient's history of lymphoma.       06/30/2016 Pathology Results    Accession: [id number] pathology specimen showed low grade lymphoma      06/30/2016 Surgery    She had LAPAROSCOPIC SIGMOID COLECTOMY,  Rigid sigmoidoscopy, with Primary repair recurrent incisional hernia         11/13/2016 PET scan    No evidence disease progression. 2. Mild decrease in metabolic activity in axillary lymph nodes. 3. Similar metabolic activity of small mediastinal lymph nodes [place] 3 . 4. Similar activity in inguinal lymph nodes. 5. No abnormal bowel activity identified       INTERVAL HISTORY: Please see below for problem oriented charting. She is here  accompanied by her husband She forgot her blood pressure pills this morning Despite very high blood pressure, she had no symptoms of headache or focal neurological deficit She recently had hip fracture status post surgery She is not very mobile She denies recent infection no new lymphadenopathy  REVIEW OF SYSTEMS:   Constitutional: Denies fevers, chills or abnormal weight loss Eyes: Denies blurriness of vision Ears, nose, mouth, throat, and face: Denies mucositis or sore throat Respiratory: Denies cough, dyspnea or wheezes Cardiovascular: Denies palpitation, chest discomfort or lower extremity swelling Gastrointestinal:  Denies nausea, heartburn or change in bowel habits Skin: Denies abnormal skin rashes Lymphatics: Denies new lymphadenopathy or easy bruising Neurological:Denies numbness, tingling or new weaknesses Behavioral/Psych: Mood is stable, no new changes  All other systems were reviewed with the patient and are negative.  I have reviewed the past medical history, past surgical history, social history and family history with the patient and they are unchanged from previous note.  ALLERGIES:  is allergic to morphine and related.  MEDICATIONS:  Current Outpatient Prescriptions  Medication Sig Dispense Refill  . cetirizine (ZYRTEC) 10 MG tablet TK 1 T PO  QD PRN  5  . furosemide (LASIX) 20 MG tablet Take 20 mg by mouth daily as needed for fluid.    Marland Kitchen HYDROcodone-acetaminophen (NORCO/VICODIN) 5-325 MG tablet Take 1-2 tablets by mouth every 6 (six) hours as needed for moderate pain. 56 tablet 0  . lisinopril (PRINIVIL,ZESTRIL) 20 MG tablet     . LOTEMAX  0.5 % ophthalmic suspension Place 1 drop into the right eye daily.    . methocarbamol (ROBAXIN) 500 MG tablet Take 1 tablet (500 mg total) by mouth every 6 (six) hours as needed for muscle spasms. 80 tablet 0  . pantoprazole (PROTONIX) 40 MG tablet Take 1 tablet (40 mg total) by mouth daily. 30 tablet 0  . polyethylene glycol  (MIRALAX / GLYCOLAX) packet Take 17 g by mouth 2 (two) times daily. 14 each 0  . sertraline (ZOLOFT) 25 MG tablet TAKE 1 TABLET BY MOUTH DAILY 30 tablet 0  . feeding supplement, ENSURE ENLIVE, (ENSURE ENLIVE) LIQD Take 237 mLs by mouth 2 (two) times daily between meals. (Patient not taking: Reported on 02/12/2017) 237 mL 12  . Loratadine (CLARITIN PO) Take 10 mg by mouth.    . traMADol (ULTRAM) 50 MG tablet Take 1 tablet (50 mg total) by mouth every 6 (six) hours as needed for moderate pain or severe pain. (Patient not taking: Reported on 02/12/2017) 60 tablet 0   No current facility-administered medications for this visit.     PHYSICAL EXAMINATION: ECOG PERFORMANCE STATUS: 2 - Symptomatic, <50% confined to bed  Vitals:   02/12/17 0952  BP: (!) 227/104  Pulse: 96  Resp: 17  Temp: 98.3 F (36.8 C)   Filed Weights    GENERAL:alert, no distress and comfortable. She looks elder and frail SKIN: skin color, texture, turgor are normal, no rashes or significant lesions EYES: normal, Conjunctiva are pink and non-injected, sclera clear OROPHARYNX:no exudate, no erythema and lips, buccal mucosa, and tongue normal  NECK: supple, thyroid normal size, non-tender, without nodularity LYMPH:  no palpable lymphadenopathy in the cervical, axillary or inguinal LUNGS: clear to auscultation and percussion with normal breathing effort HEART: regular rate & rhythm and no murmurs and no lower extremity edema ABDOMEN:abdomen soft, non-tender and normal bowel sounds Musculoskeletal:no cyanosis of digits and no clubbing  NEURO: alert & oriented x 3 with fluent speech, no focal motor/sensory deficits  LABORATORY DATA:  I have reviewed the data as listed    Component Value Date/Time   NA 142 02/12/2017 0918   K 4.4 02/12/2017 0918   CL 104 11/26/2016 0518   CO2 26 02/12/2017 0918   GLUCOSE 107 02/12/2017 0918   BUN 10.4 02/12/2017 0918   CREATININE 0.9 02/12/2017 0918   CALCIUM 11.4 (H) 02/12/2017 0918    PROT 9.4 (H) 02/12/2017 0918   ALBUMIN 4.7 02/12/2017 0918   AST 17 02/12/2017 0918   ALT 8 02/12/2017 0918   ALKPHOS 119 02/12/2017 0918   BILITOT 0.74 02/12/2017 0918   GFRNONAA >60 11/26/2016 0518   GFRAA >60 11/26/2016 0518    No results found for: SPEP, UPEP  Lab Results  Component Value Date   WBC 2.6 (L) 02/12/2017   NEUTROABS 1.1 (L) 02/12/2017   HGB 13.6 02/12/2017   HCT 41.1 02/12/2017   MCV 84.3 02/12/2017   PLT 160 02/12/2017      Chemistry      Component Value Date/Time   NA 142 02/12/2017 0918   K 4.4 02/12/2017 0918   CL 104 11/26/2016 0518   CO2 26 02/12/2017 0918   BUN 10.4 02/12/2017 0918   CREATININE 0.9 02/12/2017 0918      Component Value Date/Time   CALCIUM 11.4 (H) 02/12/2017 0918   ALKPHOS 119 02/12/2017 0918   AST 17 02/12/2017 0918   ALT 8 02/12/2017 0918   BILITOT 0.74 02/12/2017 1696  ASSESSMENT & PLAN:  Marginal zone lymphoma of axilla (HCC) The patient had good response to treatment Her last PET CT scan showed residual disease but she is not symptomatic. Serum chemistries came back grossly abnormal She is asked to return next week for repeat blood draw If blood work came back abnormal, I will order imaging study and see her back to discuss possible treatment  Hypercalcemia She has significant hypercalcemia of unknown etiology I am concerned about possible cancer recurrence I will order repeat blood work next week and recommend she avoid calcium rich diet  Essential hypertension she will continue current medical management. I recommend close follow-up with primary care doctor for medication adjustment.   Chronic leukopenia She has chronic intermittent leukopenia institutional I recommend close observation for now   Orders Placed This Encounter  Procedures  . CBC with Differential/Platelet    Standing Status:   Future    Standing Expiration Date:   03/19/2018   All questions were answered. The patient knows to  call the clinic with any problems, questions or concerns. No barriers to learning was detected. I spent 20 minutes counseling the patient face to face. The total time spent in the appointment was 25 minutes and more than 50% was on counseling and review of test results     Heath Lark, MD 02/12/2017 12:21 PM

## 2017-02-12 NOTE — Assessment & Plan Note (Signed)
The patient had good response to treatment Her last PET CT scan showed residual disease but she is not symptomatic. Serum chemistries came back grossly abnormal She is asked to return next week for repeat blood draw If blood work came back abnormal, I will order imaging study and see her back to discuss possible treatment

## 2017-02-12 NOTE — Assessment & Plan Note (Signed)
She has chronic intermittent leukopenia institutional I recommend close observation for now

## 2017-02-12 NOTE — Assessment & Plan Note (Signed)
she will continue current medical management. I recommend close follow-up with primary care doctor for medication adjustment.  

## 2017-02-15 ENCOUNTER — Other Ambulatory Visit: Payer: Medicare Other

## 2017-02-15 ENCOUNTER — Telehealth: Payer: Self-pay | Admitting: *Deleted

## 2017-02-15 ENCOUNTER — Telehealth: Payer: Self-pay | Admitting: Hematology and Oncology

## 2017-02-15 LAB — VITAMIN B12: VITAMIN B 12: 475 pg/mL (ref 232–1245)

## 2017-02-15 NOTE — Telephone Encounter (Signed)
Returned call and lvm to inform pto r/s lab appt to 4/20 at 1130 per LOS
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
## 2017-02-19 ENCOUNTER — Other Ambulatory Visit (HOSPITAL_BASED_OUTPATIENT_CLINIC_OR_DEPARTMENT_OTHER): Payer: Medicare Other

## 2017-02-19 DIAGNOSIS — C8584 Other specified types of non-Hodgkin lymphoma, lymph nodes of axilla and upper limb: Secondary | ICD-10-CM | POA: Diagnosis not present

## 2017-02-19 LAB — COMPREHENSIVE METABOLIC PANEL
ALBUMIN: 3.8 g/dL (ref 3.5–5.0)
ALK PHOS: 92 U/L (ref 40–150)
ALT: 8 U/L (ref 0–55)
ANION GAP: 8 meq/L (ref 3–11)
AST: 14 U/L (ref 5–34)
BILIRUBIN TOTAL: 0.36 mg/dL (ref 0.20–1.20)
BUN: 16.7 mg/dL (ref 7.0–26.0)
CALCIUM: 10.2 mg/dL (ref 8.4–10.4)
CO2: 26 mEq/L (ref 22–29)
Chloride: 106 mEq/L (ref 98–109)
Creatinine: 0.9 mg/dL (ref 0.6–1.1)
EGFR: 65 mL/min/{1.73_m2} — AB (ref >90)
Glucose: 72 mg/dl (ref 70–140)
Potassium: 4.3 mEq/L (ref 3.5–5.1)
Sodium: 141 mEq/L (ref 136–145)
TOTAL PROTEIN: 7.5 g/dL (ref 6.4–8.3)

## 2017-02-19 LAB — CBC WITH DIFFERENTIAL/PLATELET
BASO%: 0.7 % (ref 0.0–2.0)
Basophils Absolute: 0 10*3/uL (ref 0.0–0.1)
EOS%: 4.6 % (ref 0.0–7.0)
Eosinophils Absolute: 0.1 10*3/uL (ref 0.0–0.5)
HEMATOCRIT: 33.3 % — AB (ref 34.8–46.6)
HGB: 10.9 g/dL — ABNORMAL LOW (ref 11.6–15.9)
LYMPH#: 1.2 10*3/uL (ref 0.9–3.3)
LYMPH%: 43.9 % (ref 14.0–49.7)
MCH: 28 pg (ref 25.1–34.0)
MCHC: 32.8 g/dL (ref 31.5–36.0)
MCV: 85.6 fL (ref 79.5–101.0)
MONO#: 0.2 10*3/uL (ref 0.1–0.9)
MONO%: 8.8 % (ref 0.0–14.0)
NEUT%: 42 % (ref 38.4–76.8)
NEUTROS ABS: 1.2 10*3/uL — AB (ref 1.5–6.5)
Platelets: 162 10*3/uL (ref 145–400)
RBC: 3.9 10*6/uL (ref 3.70–5.45)
RDW: 15.6 % — ABNORMAL HIGH (ref 11.2–14.5)
WBC: 2.8 10*3/uL — AB (ref 3.9–10.3)

## 2017-02-22 LAB — KAPPA/LAMBDA LIGHT CHAINS
Ig Kappa Free Light Chain: 106.3 mg/L — ABNORMAL HIGH (ref 3.3–19.4)
Ig Lambda Free Light Chain: 82.7 mg/L — ABNORMAL HIGH (ref 5.7–26.3)
KAPPA/LAMBDA FLC RATIO: 1.29 (ref 0.26–1.65)

## 2017-02-25 LAB — MULTIPLE MYELOMA PANEL, SERUM
Albumin SerPl Elph-Mcnc: 3.8 g/dL (ref 2.9–4.4)
Albumin/Glob SerPl: 1.2 (ref 0.7–1.7)
Alpha 1: 0.3 g/dL (ref 0.0–0.4)
Alpha2 Glob SerPl Elph-Mcnc: 0.5 g/dL (ref 0.4–1.0)
B-Globulin SerPl Elph-Mcnc: 1.1 g/dL (ref 0.7–1.3)
Gamma Glob SerPl Elph-Mcnc: 1.5 g/dL (ref 0.4–1.8)
Globulin, Total: 3.3 g/dL (ref 2.2–3.9)
IgA, Qn, Serum: 548 mg/dL — ABNORMAL HIGH (ref 64–422)
IgG, Qn, Serum: 1329 mg/dL (ref 700–1600)
IgM, Qn, Serum: 177 mg/dL (ref 26–217)
M Protein SerPl Elph-Mcnc: 0.6 g/dL — ABNORMAL HIGH
Total Protein: 7.1 g/dL (ref 6.0–8.5)

## 2017-02-25 LAB — PTH-RELATED PEPTIDE: PTHrP (PTH-Related Peptide): 2.9 pmol/L

## 2017-03-04 ENCOUNTER — Telehealth: Payer: Self-pay | Admitting: *Deleted

## 2017-03-04 NOTE — Telephone Encounter (Signed)
1) I am overbooked 2) She had GI surgery before and her symptoms are suggestive of bowel obstruction. I do not know how to manage bowel obstructions. She needs to be evaluated either by PCP/urgent care/ED 3) Her repeat labs are fine. You may release a copy 4) She should not take medications for sinus infection now even if she can find her prescriptions due to #2

## 2017-03-04 NOTE — Telephone Encounter (Signed)
"  This is Erika Day [phone])  calling for my mom.  We're very concerned about her.  Can she be seen by Dr. Alvy Bimler?  She's having problems with swallowing which she says Dr. Alvy Bimler told her this could happen.  Diarrhea all week, three times a day .  She eats, it comes right out.  Had told me she was constipated, then flipped to diarrhea.  Vomiting two to three times a day that looks like food.  I saw now evidence of swelling or bloated abdomen. She did C/O stomach hurting.  Concerned about dehydration and she had a bowel obstruction ion the past.  Monday night her temp = 99.5.  She's confused about medications.  She says Dr. Alvy Bimler gave her a prescription for sinus infection three weeks ago but can't find it.  We also have not received a call with her latest lab results.  Ask Dr. Alvy Bimler to call me."    Will notify provider.  No new presription orders provided with last visit.

## 2017-03-04 NOTE — Telephone Encounter (Signed)
Provided daughter [name] with orders and instructions per Dr.Gorsuch.    "I will take her to Urgent Care.  Yesterday her PCP said he could not see her until the tenth of May.  Thank you."

## 2017-03-05 ENCOUNTER — Emergency Department (HOSPITAL_COMMUNITY)
Admission: EM | Admit: 2017-03-05 | Discharge: 2017-03-06 | Disposition: A | Discharge status: Home / Self Care | Payer: Medicare Other | Attending: Emergency Medicine | Admitting: Emergency Medicine

## 2017-03-05 ENCOUNTER — Emergency Department (HOSPITAL_COMMUNITY): Payer: Medicare Other

## 2017-03-05 ENCOUNTER — Encounter (HOSPITAL_COMMUNITY): Payer: Self-pay | Admitting: Emergency Medicine

## 2017-03-05 DIAGNOSIS — Z87891 Personal history of nicotine dependence: Secondary | ICD-10-CM | POA: Insufficient documentation

## 2017-03-05 DIAGNOSIS — K529 Noninfective gastroenteritis and colitis, unspecified: Secondary | ICD-10-CM | POA: Diagnosis not present

## 2017-03-05 DIAGNOSIS — E119 Type 2 diabetes mellitus without complications: Secondary | ICD-10-CM | POA: Diagnosis not present

## 2017-03-05 DIAGNOSIS — N3000 Acute cystitis without hematuria: Secondary | ICD-10-CM | POA: Insufficient documentation

## 2017-03-05 DIAGNOSIS — I10 Essential (primary) hypertension: Secondary | ICD-10-CM | POA: Diagnosis not present

## 2017-03-05 DIAGNOSIS — Z79899 Other long term (current) drug therapy: Secondary | ICD-10-CM | POA: Insufficient documentation

## 2017-03-05 DIAGNOSIS — Z8673 Personal history of transient ischemic attack (TIA), and cerebral infarction without residual deficits: Secondary | ICD-10-CM | POA: Diagnosis not present

## 2017-03-05 DIAGNOSIS — R109 Unspecified abdominal pain: Secondary | ICD-10-CM | POA: Diagnosis present

## 2017-03-05 LAB — CBC
HCT: 34.1 % — ABNORMAL LOW (ref 36.0–46.0)
Hemoglobin: 10.8 g/dL — ABNORMAL LOW (ref 12.0–15.0)
MCH: 27.6 pg (ref 26.0–34.0)
MCHC: 31.7 g/dL (ref 30.0–36.0)
MCV: 87.2 fL (ref 78.0–100.0)
PLATELETS: 161 10*3/uL (ref 150–400)
RBC: 3.91 MIL/uL (ref 3.87–5.11)
RDW: 13.8 % (ref 11.5–15.5)
WBC: 2.8 10*3/uL — AB (ref 4.0–10.5)

## 2017-03-05 LAB — URINALYSIS, ROUTINE W REFLEX MICROSCOPIC
Bilirubin Urine: NEGATIVE
GLUCOSE, UA: NEGATIVE mg/dL
Ketones, ur: NEGATIVE mg/dL
NITRITE: POSITIVE — AB
PH: 5 (ref 5.0–8.0)
Protein, ur: NEGATIVE mg/dL
SPECIFIC GRAVITY, URINE: 1.009 (ref 1.005–1.030)

## 2017-03-05 LAB — COMPREHENSIVE METABOLIC PANEL
ALK PHOS: 76 U/L (ref 38–126)
ALT: 12 U/L — AB (ref 14–54)
AST: 18 U/L (ref 15–41)
Albumin: 4 g/dL (ref 3.5–5.0)
Anion gap: 5 (ref 5–15)
BUN: 17 mg/dL (ref 6–20)
CO2: 25 mmol/L (ref 22–32)
CREATININE: 1.02 mg/dL — AB (ref 0.44–1.00)
Calcium: 9.7 mg/dL (ref 8.9–10.3)
Chloride: 106 mmol/L (ref 101–111)
GFR calc non Af Amer: 49 mL/min — ABNORMAL LOW (ref >60)
GFR, EST AFRICAN AMERICAN: 56 mL/min — AB (ref >60)
Glucose, Bld: 75 mg/dL (ref 65–99)
Potassium: 4.6 mmol/L (ref 3.5–5.1)
SODIUM: 136 mmol/L (ref 135–145)
TOTAL PROTEIN: 7.1 g/dL (ref 6.5–8.1)
Total Bilirubin: 0.4 mg/dL (ref 0.3–1.2)

## 2017-03-05 LAB — LIPASE, BLOOD: Lipase: 77 U/L — ABNORMAL HIGH (ref 11–51)

## 2017-03-05 MED ORDER — FENTANYL CITRATE (PF) 100 MCG/2ML IJ SOLN
50.0000 ug | Freq: Once | INTRAMUSCULAR | Status: AC
  Administered 2017-03-05: 50 ug via INTRAVENOUS
  Filled 2017-03-05: qty 2

## 2017-03-05 MED ORDER — ONDANSETRON HCL 4 MG/2ML IJ SOLN
4.0000 mg | Freq: Once | INTRAMUSCULAR | Status: AC
  Administered 2017-03-05: 4 mg via INTRAVENOUS
  Filled 2017-03-05: qty 2

## 2017-03-05 MED ORDER — IOPAMIDOL (ISOVUE-300) INJECTION 61%
INTRAVENOUS | Status: AC
  Filled 2017-03-05: qty 100

## 2017-03-05 MED ORDER — CIPROFLOXACIN IN D5W 400 MG/200ML IV SOLN
400.0000 mg | Freq: Once | INTRAVENOUS | Status: AC
  Administered 2017-03-06: 400 mg via INTRAVENOUS
  Filled 2017-03-05: qty 200

## 2017-03-05 MED ORDER — METRONIDAZOLE IN NACL 5-0.79 MG/ML-% IV SOLN
500.0000 mg | Freq: Once | INTRAVENOUS | Status: AC
  Administered 2017-03-06: 500 mg via INTRAVENOUS
  Filled 2017-03-05: qty 100

## 2017-03-05 MED ORDER — IOPAMIDOL (ISOVUE-300) INJECTION 61%
100.0000 mL | Freq: Once | INTRAVENOUS | Status: AC | PRN
  Administered 2017-03-05: 80 mL via INTRAVENOUS

## 2017-03-05 NOTE — ED Provider Notes (Signed)
[facility] DEPT Provider Note   CSN: [id number] Arrival date & time: 03/05/17  1657     History   Chief Complaint Chief Complaint  Patient presents with  . Diarrhea  . Weakness    HPI Erika Day is a 83 y.o. female.  Patient with PMH of CLL (recently finished chemo), DM, HL, HTN, stroke, and prior SBO presents to the ED with a chief complaint of abdominal pain.  She states that she has been having intermittent episodes of nausea, vomiting, and diarrhea for the past 2 weeks.  She states that over the past day or so she has developed severe abdominal pain, and now states that she is no longer having any diarrhea.  She denies any fevers, chills, or CP.  She states that it feels like she has gas in her abdomen that rises up and makes it feel like she is short of breath.  She has not taken anything for her symptoms.  There are no other associated symptoms.  Her abdominal pain is worsened with palpation of the abdomen.  No recent antibiotic use.    The history is provided by the patient. No language interpreter was used.    Past Medical History:  Diagnosis Date  . Acid reflux disease   . Cancer [facility])    Looking for documentation of this  . CLL (chronic lymphocytic leukemia) ([place]) 05/15/2016  . DDD (degenerative disc disease), lumbar   . Degenerative joint disease   . Diabetes mellitus without complication ([place])   . Dyslipidemia   . Hypertension   . 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 Panic attacks   . Pneumonia 02/2016  . 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 Stroke [facility]) age 53 yo   poorly controlled DM and Hypertension    Patient Active Problem List   Diagnosis Date Noted  . Hypercalcemia 02/12/2017  . Chronic leukopenia 02/12/2017  . Postoperative anemia due to acute blood loss 11/26/2016  . Fall 11/23/2016  . Closed fracture of intertrochanteric section of femur ([place]) 11/23/2016  . Other pancytopenia ([place]) 11/13/2016  .  Sinusitis 10/14/2016  . Bleeding external hemorrhoids 08/20/2016  . Recurrent ventral incisional hernia s/p primary repair 06/30/2016 07/01/2016  . Pressure ulcer 05/20/2016  . Lower extremity edema 05/20/2016  . Protein-calorie malnutrition, severe ([place]) 05/20/2016  . Volvulus of sigmoid colon ([place])   . Sigmoid volvulus s/p lap sigmoid colectomy 06/30/2016 05/19/2016  . 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 Constipation 05/15/2016  . Other fatigue 05/15/2016  . CAP (community acquired pneumonia) 02/13/2016  . Anemia, unspecified 02/13/2016  . Thrombocytopenia ([place]) 02/13/2016  . Hyponatremia 02/13/2016  . Kyphosis 02/01/2016  . Pressure ulcer of coccygeal region 01/31/2016  . Mobility impaired 01/31/2016  . Osteoarthritis 01/31/2016  . Essential hypertension 09/06/2011  . Ovarian torsion 08/25/2011    Past Surgical History:  Procedure Laterality Date  . ABDOMINAL HYSTERECTOMY  ? 08/25/2011 ?   Has BSO for left benign ovarian tumor with torsion and broad ligament benign tumor, but does not appear had hysterectomy--[facility], NOT ovarian cancer  . CHOLECYSTECTOMY    . FEMUR IM NAIL Left 11/23/2016   Procedure: INTRAMEDULLARY (IM) NAIL FEMORAL;  Surgeon: Gaynelle Arabian, MD;  Location: [facility];  Service: Orthopedics;  Laterality: Left;  . FLEXIBLE SIGMOIDOSCOPY N/A 05/19/2016   Procedure: FLEXIBLE SIGMOIDOSCOPY;  Surgeon: [name], MD;  Location:  [facility];  Service: Endoscopy;  Laterality: N/A;  . FLEXIBLE SIGMOIDOSCOPY N/A 06/28/2016   Procedure: FLEXIBLE SIGMOIDOSCOPY;  Surgeon: Ladene Artist, MD;  Location: [facility];  Service: Endoscopy;  Laterality: N/A;  . LAPAROSCOPIC SIGMOID COLECTOMY N/A 06/30/2016   Procedure: LAPAROSCOPIC SIGMOID COLECTOMY,rigid sigmoidoscopy, repair recurrent incisional hernia;  Surgeon: Michael Boston, MD;  Location: [facility];  Service: General;  Laterality: N/A;  .  LAPAROTOMY N/A 04/29/2014   Procedure: EXPLORATORY LAPAROTOMY;  Surgeon: Imogene Burn. Georgette Dover, MD;  Location: [facility];  Service: General;  Laterality: N/A;  . LYSIS OF ADHESION N/A 04/29/2014   Procedure: EXTENSIVE LYSIS OF ADHESIONS;  Surgeon: Imogene Burn. Georgette Dover, MD;  Location: [facility];  Service: General;  Laterality: N/A;  . VENTRAL HERNIA REPAIR N/A 04/29/2014   Procedure: PRIMARY REPAIR OF VENTRAL HERNIA;  Surgeon: Imogene Burn. Georgette Dover, MD;  Location: [facility];  Service: General;  Laterality: N/A;    OB History    No data available       Home Medications    Prior to Admission medications   Medication Sig Start Date End Date Taking? Authorizing Provider  cetirizine (ZYRTEC) 10 MG tablet Take 10 mg by mouth daily 01/19/17  Yes [provider]  furosemide (LASIX) 20 MG tablet Take 20 mg by mouth daily as needed for fluid.   Yes [provider]  lisinopril (PRINIVIL,ZESTRIL) 20 MG tablet Take 20 mg by mouth daily.  01/20/17  Yes [provider]  LOTEMAX 0.5 % ophthalmic suspension Place 1 drop into the left eye daily.  11/03/16  Yes [provider]  methocarbamol (ROBAXIN) 500 MG tablet Take 1 tablet (500 mg total) by mouth every 6 (six) hours as needed for muscle spasms. 11/26/16  Yes Perkins, Alexzandrew L, PA-C  pantoprazole (PROTONIX) 40 MG tablet Take 1 tablet (40 mg total) by mouth daily. 06/05/16  Yes Gorsuch, Ni, MD  polyethylene glycol (MIRALAX / GLYCOLAX) packet Take 17 g by mouth 2 (two) times daily. 05/22/16  Yes Charlynne Cousins, MD  feeding supplement, ENSURE ENLIVE, (ENSURE ENLIVE) LIQD Take 237 mLs by mouth 2 (two) times daily between meals. Patient not taking: Reported on 02/12/2017 07/03/16   Lavina Hamman, MD  HYDROcodone-acetaminophen (NORCO/VICODIN) 5-325 MG tablet Take 1-2 tablets by mouth every 6 (six) hours as needed for moderate pain. Patient not taking: Reported on 03/05/2017 11/26/16   Dara Lords, Alexzandrew L, PA-C  sertraline (ZOLOFT) 25 MG tablet TAKE  1 TABLET BY MOUTH DAILY Patient not taking: Reported on 03/05/2017 09/17/16   Heath Lark, MD  traMADol (ULTRAM) 50 MG tablet Take 1 tablet (50 mg total) by mouth every 6 (six) hours as needed for moderate pain or severe pain. Patient not taking: Reported on 02/12/2017 11/13/16   Heath Lark, MD    Family History Family History  Problem Relation Age of Onset  . Hypertension Mother   . Stroke Mother   . Arthritis Mother   . Alcohol abuse Father     Social History Social History  Substance Use Topics  . Smoking status: Former Smoker    Types: Cigarettes    Quit date: 02/18/1990  . Smokeless tobacco: Former Systems developer    Types: Snuff    Quit date: 02/18/1990  . Alcohol use No     Comment: Only drank alcohol between ages of 81 and 73 yo.  Her father gave it to her     Allergies   Morphine and related   Review of Systems Review of Systems  Gastrointestinal: Positive for abdominal pain, diarrhea, nausea and vomiting.  All other systems reviewed and are negative.    Physical Exam Updated Vital Signs BP (!) 141/70 (BP Location: Left Arm)   Pulse 66   Temp 98.8 F (37.1 C) (Oral)   Resp 18   Ht 5\' 7"  (1.702 m)   Wt 61.7 kg   SpO2 100%   BMI 21.30 kg/m   Physical Exam  Constitutional: She is oriented to person, place, and time. She appears well-developed and well-nourished.  Thin, chronically ill appearing  HENT:  Head: Normocephalic and atraumatic.  Eyes: Conjunctivae and EOM are normal. Pupils are equal, round, and reactive to light.  Neck: Normal range of motion. Neck supple.  Cardiovascular: Normal rate and regular rhythm.  Exam reveals no gallop and no friction rub.   No murmur heard. Pulmonary/Chest: Effort normal and breath sounds normal. No respiratory distress. She has no wheezes. She has no rales. She exhibits no tenderness.  Abdominal: Soft. Bowel sounds are normal. She exhibits no distension and no mass. There is tenderness. There is no rebound and no guarding.    Generalized abdominal tenderness  Musculoskeletal: Normal range of motion. She exhibits no edema or tenderness.  Neurological: She is alert and oriented to person, place, and time.  Skin: Skin is warm and dry.  Psychiatric: She has a normal mood and affect. Her behavior is normal. Judgment and thought content normal.  Nursing note and vitals reviewed.    ED Treatments / Results  Labs (all labs ordered are listed, but only abnormal results are displayed) Labs Reviewed  LIPASE, BLOOD - Abnormal; Notable for the following:       Result Value   Lipase 77 (*)    All other components within normal limits  COMPREHENSIVE METABOLIC PANEL - Abnormal; Notable for the following:    Creatinine, Ser 1.02 (*)    ALT 12 (*)    GFR calc non Af Amer 49 (*)    GFR calc Af Amer 56 (*)    All other components within normal limits  CBC - Abnormal; Notable for the following:    WBC 2.8 (*)    Hemoglobin 10.8 (*)    HCT 34.1 (*)    All other components within normal limits  URINALYSIS, ROUTINE W REFLEX MICROSCOPIC - Abnormal; Notable for the following:    Hgb urine dipstick SMALL (*)    Nitrite POSITIVE (*)    Leukocytes, UA SMALL (*)    Bacteria, UA MANY (*)    Squamous Epithelial / LPF 0-5 (*)    All other components within normal limits    EKG  EKG Interpretation None       Radiology No results found.  Procedures Procedures (including critical care time)  Medications Ordered in ED Medications  fentaNYL (SUBLIMAZE) injection 50 mcg (not administered)  ondansetron (ZOFRAN) injection 4 mg (not administered)     Initial Impression / Assessment and Plan / ED Course  I have reviewed the triage vital signs and the nursing notes.  Pertinent labs & imaging results that were available during my care of the patient were reviewed by me and considered in my medical decision making (see chart for details).    Patient with abdominal pain, n/v/d.  n/v/d has been going on for 2 weeks, but  the abdominal pain just started in the past day or so.  Will treat pain, check CT, and reassess.  She is quite tender over her abdomen.  She has a  hx of SBO.  CT shows enteritis, no other acute abnormality.  No evidence of mechanical obstruction.  Given the length of time since symptom onset, feel that she may benefit from antibiotic therapy for the enteritis.    Patient seen by and discussed with Dr. Christy Gentles; admission was offered, but patient and family declined.   Dr. Christy Gentles agrees that patient is stable for discharge.    Final Clinical Impressions(s) / ED Diagnoses   Final diagnoses:  Enteritis  Acute cystitis without hematuria    New Prescriptions New Prescriptions   CIPROFLOXACIN (CIPRO) 500 MG TABLET    Take 1 tablet (500 mg total) by mouth 2 (two) times daily.   METRONIDAZOLE (FLAGYL) 500 MG TABLET    Take 1 tablet (500 mg total) by mouth 2 (two) times daily.   ONDANSETRON (ZOFRAN) 4 MG TABLET    Take 1 tablet (4 mg total) by mouth every 6 (six) hours.     Montine Circle, PA-C 03/06/17 Leandrew Koyanagi    Tanna Furry, MD 03/17/17 [phone]

## 2017-03-05 NOTE — ED Triage Notes (Signed)
Pt reports diarrhea for the past week. Also complains of generalized weakness. Has also had emesis. Pt not on chemo. Hx of SBO.

## 2017-03-06 DIAGNOSIS — N3000 Acute cystitis without hematuria: Secondary | ICD-10-CM | POA: Diagnosis not present

## 2017-03-06 MED ORDER — METRONIDAZOLE 500 MG PO TABS: 500.0000 mg | ORAL_TABLET | Freq: Two times a day (BID) | ORAL | 0 refills | Status: DC

## 2017-03-06 MED ORDER — ONDANSETRON HCL 4 MG PO TABS: 4.0000 mg | ORAL_TABLET | Freq: Four times a day (QID) | ORAL | 0 refills | Status: DC

## 2017-03-06 MED ORDER — CIPROFLOXACIN HCL 500 MG PO TABS: 500.0000 mg | ORAL_TABLET | Freq: Two times a day (BID) | ORAL | 0 refills | Status: DC

## 2017-03-08 LAB — URINE CULTURE: Culture: >=100000 — AB

## 2017-03-09 ENCOUNTER — Telehealth: Payer: Self-pay | Admitting: Emergency Medicine

## 2017-03-09 NOTE — Telephone Encounter (Signed)
Post ED Visit - Positive Culture Follow-up  Culture report reviewed by antimicrobial stewardship pharmacist:  [x]  Elenor Quinones, Pharm.D. []  Heide Guile, Pharm.D., BCPS AQ-ID []  Parks Neptune, Pharm.D., BCPS []  Alycia Rossetti, Pharm.D., BCPS []  [place], [place].D., BCPS, AAHIVP []  Legrand Como, Pharm.D., BCPS, AAHIVP []  Salome Arnt, PharmD, BCPS []  Dimitri Ped, PharmD, BCPS []  Vincenza Hews, PharmD, BCPS  Positive urine culture Treated with ciprofloxacin and metronidazole, organism sensitive to the same and no further patient follow-up is required at this time.  Hazle Nordmann 03/09/2017, 3:14 PM

## 2017-04-19 ENCOUNTER — Ambulatory Visit (INDEPENDENT_AMBULATORY_CARE_PROVIDER_SITE_OTHER): Payer: Medicare Other | Admitting: Internal Medicine

## 2017-04-19 ENCOUNTER — Encounter: Payer: Self-pay | Admitting: Internal Medicine

## 2017-04-19 VITALS — BP 112/58 | HR 60 | Temp 99.0°F | Resp 12

## 2017-04-19 DIAGNOSIS — J069 Acute upper respiratory infection, unspecified: Secondary | ICD-10-CM

## 2017-04-19 DIAGNOSIS — Z8639 Personal history of other endocrine, nutritional and metabolic disease: Secondary | ICD-10-CM | POA: Diagnosis not present

## 2017-04-19 HISTORY — DX: Personal history of other endocrine, nutritional and metabolic disease: Z86.39

## 2017-04-19 LAB — GLUCOSE, POCT (MANUAL RESULT ENTRY): POC Glucose: 135 mg/dl — AB (ref 70–99)

## 2017-04-19 MED ORDER — DEXTROMETHORPHAN-GUAIFENESIN 5-100 MG/5ML PO LIQD: ORAL | 0 refills | Status: DC

## 2017-04-19 NOTE — Progress Notes (Signed)
   Subjective:    Patient ID: Erika Day, female    DOB: 1930/10/10, 83 y.o.   MRN: [id number]  HPI   Here to reestablish--ill today. 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  Has not been under treatment for the lymphoma in some time.  Started with a cough 3 nights ago.  Sputum was clear, though turning yellow.  Nasal mucous similarly.  When coughs and coughs, has chest discomfort.  Has a sore throat.   Headache at top of head and down into neck with some right arm pain.    States did have a fever of 101.3  Night before last.  Took a couple of baby ASA subsequently.Temp down to 99.3 the next morning.   Does have a hospital bed in the home now, helps with ability to sleep with head of bed propped up.   Current Meds  Medication Sig  . cetirizine (ZYRTEC) 10 MG tablet Take 10 mg by mouth daily  . furosemide (LASIX) 20 MG tablet Take 20 mg by mouth daily as needed for fluid.  Marland Kitchen lisinopril (PRINIVIL,ZESTRIL) 20 MG tablet Take 20 mg by mouth daily.   Marland Kitchen LOTEMAX 0.5 % ophthalmic suspension Place 1 drop into the left eye daily.   . methocarbamol (ROBAXIN) 500 MG tablet Take 1 tablet (500 mg total) by mouth every 6 (six) hours as needed for muscle spasms.  . pantoprazole (PROTONIX) 40 MG tablet Take 1 tablet (40 mg total) by mouth daily.  . polyethylene glycol (MIRALAX / GLYCOLAX) packet Take 17 g by mouth 2 (two) times daily.  . sertraline (ZOLOFT) 25 MG tablet TAKE 1 TABLET BY MOUTH DAILY  . traMADol (ULTRAM) 50 MG tablet Take 1 tablet (50 mg total) by mouth every 6 (six) hours as needed for moderate pain or severe pain.    Allergies  Allergen Reactions  . Morphine And Related Other (See Comments)    Blood sugar dropped one-time        Review of Systems     Objective:   Physical Exam  NAD Mildly hoarse voice HEENT: PERRL EOMI, TMs pearly gray, throat without injection. Nasal mucosa with mild swelling and clear discharge.  Mild tenderness  over right maxillary sinus Neck:  Supple, Mild bilateral anterior cervical adenopathy--mildy tender.   Chest: CTA CV: RRR with Grade II- III/VI SEM, LSB.  Normodynamic carotid and radial pulses.  Mild LE edema to ankles. Abd:  S, + BS, No HSM or mass appreciated, but patent kyphotic in wheelchair.        Assessment & Plan:  1.  URI:  With hypertension, recommend continuing Zyrtec and adding Delsym with guaifenesin.  10-20/200-400 twice daily. Push fluids.  Avoid acidic fluids.  Eat soft, easily swallowed solids, especially if cool. Call if worsens and I will check in on her at home.

## 2017-04-19 NOTE — Patient Instructions (Signed)
Call if you worsen  Eat things that don't hurt to swallow: puddings, applesauce, popsickles, cool liquids

## 2017-05-13 ENCOUNTER — Other Ambulatory Visit: Payer: Self-pay | Admitting: Internal Medicine

## 2017-05-15 IMAGING — PT NM PET TUM IMG INITIAL (PI) SKULL BASE T - THIGH
1 of 8 series · 3 of 25 positions shown · non-contrast
Comparison: CT chest 02/13/2016 and CT abdomen pelvis 05/18/2015.

CLINICAL DATA: Initial treatment strategy for lymphoma.

EXAM:
NUCLEAR MEDICINE PET SKULL BASE TO THIGH
TECHNIQUE: 6.4 mCi F-18 FDG was injected intravenously. Full-ring PET imaging
was performed from the skull base to thigh after the radiotracer. CT
data was obtained and used for attenuation correction and anatomic
localization.
FASTING BLOOD GLUCOSE:  Value: 81 mg/dl

[Series 4: ct sk_thigh 5.0 b31f · axial · 5.0mm · 0.98mm/px · z∈[-694,+22]mm · 3 of 180 slices shown]
[im 1/180  brain]
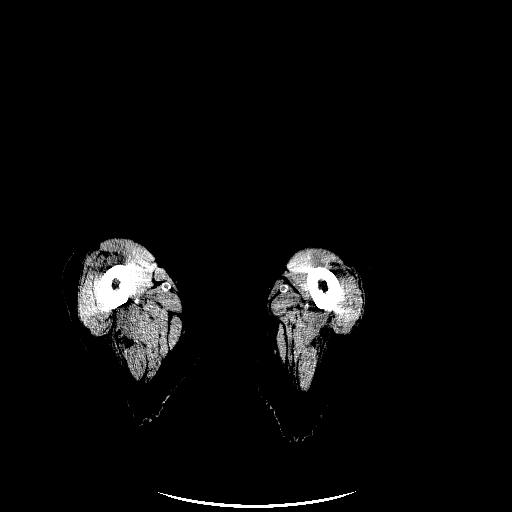
[im 45/180  brain]
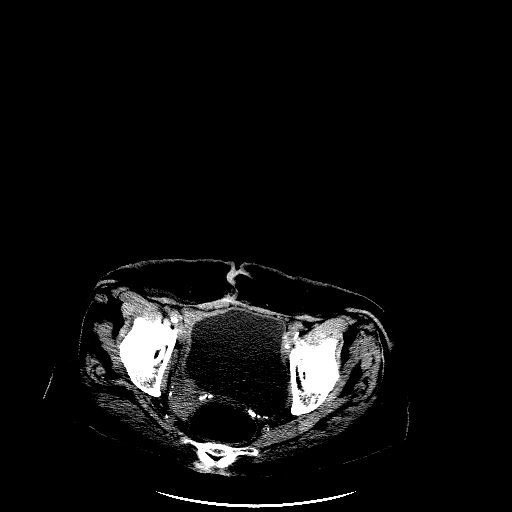
[im 180/180  brain]
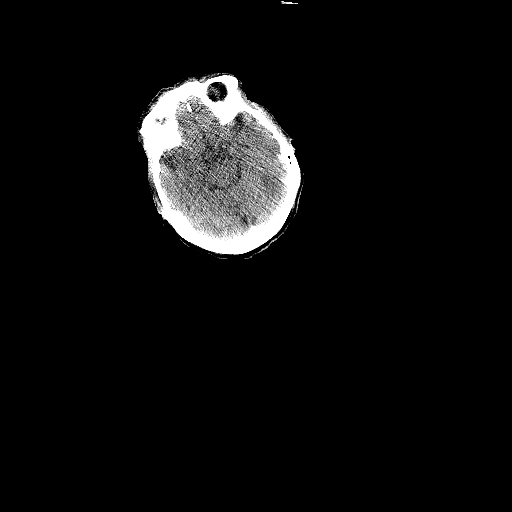

[3 of 25 positions shown; findings below may reference images not displayed]

FINDINGS: NECK

No hypermetabolic lymph nodes in the neck. CT images show no acute
findings.

CHEST

Mediastinal lymph nodes measure up to 1.6 cm in the precarinal
station, with an SUV max of 3.9. Left axillary lymph nodes measure
up to 11 mm with an SUV max of 2.6. Mild hypermetabolism is seen in
the left thyroid, nonspecific. CT images show atherosclerotic
calcification throughout the arterial vasculature, including
coronary arteries. Heart is at the upper limits of normal in size.
No pericardial or pleural effusion. No hypermetabolic pulmonary
nodules.

ABDOMEN/PELVIS

No abnormal hypermetabolism in the liver, adrenal glands, spleen or
pancreas. Right inguinal lymph node measures 12 mm with an SUV max
of 2.6. No additional hypermetabolic lymph nodes. CT images show a
right hemidiaphragm elevation. Liver is grossly unremarkable.
Cholecystectomy. Adrenal glands and right kidney are grossly
unremarkable. Tiny stone in the left kidney. Spleen, pancreas,
stomach grossly unremarkable. Colon is somewhat distended with air.
Large amount of stool is seen in the rectosigmoid colon.

SKELETON

No abnormal osseous hypermetabolism. Degenerative changes are seen
in the spine.
IMPRESSION: 1. Mildly hypermetabolic mediastinal, left axillary and right
inguinal lymph nodes.
2. Mild hypermetabolism in the left thyroid, nonspecific. Consider
thyroid ultrasound, as clinically indicated, as malignancy cannot be
excluded.
3. Aortic atherosclerosis and coronary artery calcification.
4. Gaseous distension of the colon with possible fecal impaction.
Per report, patient was recently treated for a sigmoid volvulus.
5. Left renal stone.

## 2017-05-17 NOTE — Telephone Encounter (Signed)
To Dr. Mulberry for approval 

## 2017-05-17 NOTE — Telephone Encounter (Signed)
Need to know how often she is using Tramadol and for what type of pain she is taking this as has not been filled since January. Also, does not appear she has utilized the Sertraline since first filled in November.--please confirm as well.

## 2017-06-04 NOTE — Telephone Encounter (Signed)
Spoke with patient. States she did not request a refill of tramadol or Sertraline. States she does have sertraline but she does not take it on a regular basis. States he does not take the tramadol anymore.

## 2017-06-08 NOTE — Telephone Encounter (Signed)
Please let her know she should take the sertraline regularly or not at all.  It only works well if she is taking daily.

## 2017-06-10 NOTE — Telephone Encounter (Signed)
Medication discussed with patien

## 2017-06-11 ENCOUNTER — Telehealth: Payer: Self-pay | Admitting: Internal Medicine

## 2017-06-11 MED ORDER — DICYCLOMINE HCL 10 MG PO CAPS: ORAL_CAPSULE | ORAL | 2 refills | Status: DC

## 2017-06-11 NOTE — Telephone Encounter (Signed)
Dr. Amil Amen will notify patient. Going to do a quick home visit

## 2017-08-16 ENCOUNTER — Encounter: Payer: Self-pay | Admitting: Hematology and Oncology

## 2017-08-16 ENCOUNTER — Ambulatory Visit (HOSPITAL_BASED_OUTPATIENT_CLINIC_OR_DEPARTMENT_OTHER): Payer: Medicare Other | Admitting: Hematology and Oncology

## 2017-08-16 ENCOUNTER — Other Ambulatory Visit (HOSPITAL_BASED_OUTPATIENT_CLINIC_OR_DEPARTMENT_OTHER): Payer: Medicare Other

## 2017-08-16 ENCOUNTER — Telehealth: Payer: Self-pay | Admitting: Hematology and Oncology

## 2017-08-16 VITALS — BP 176/73 | HR 67 | Temp 97.9°F | Resp 16 | Ht 67.0 in | Wt 146.7 lb

## 2017-08-16 DIAGNOSIS — D61818 Other pancytopenia: Secondary | ICD-10-CM

## 2017-08-16 DIAGNOSIS — K5909 Other constipation: Secondary | ICD-10-CM | POA: Diagnosis not present

## 2017-08-16 DIAGNOSIS — C8584 Other specified types of non-Hodgkin lymphoma, lymph nodes of axilla and upper limb: Secondary | ICD-10-CM

## 2017-08-16 DIAGNOSIS — Z23 Encounter for immunization: Secondary | ICD-10-CM

## 2017-08-16 DIAGNOSIS — I10 Essential (primary) hypertension: Secondary | ICD-10-CM

## 2017-08-16 LAB — CBC WITH DIFFERENTIAL/PLATELET
BASO%: 0.3 % (ref 0.0–2.0)
BASOS ABS: 0 10*3/uL (ref 0.0–0.1)
EOS ABS: 0.1 10*3/uL (ref 0.0–0.5)
EOS%: 2.3 % (ref 0.0–7.0)
HEMATOCRIT: 35.6 % (ref 34.8–46.6)
HEMOGLOBIN: 11.7 g/dL (ref 11.6–15.9)
LYMPH%: 45.7 % (ref 14.0–49.7)
MCH: 28.7 pg (ref 25.1–34.0)
MCHC: 32.9 g/dL (ref 31.5–36.0)
MCV: 87.3 fL (ref 79.5–101.0)
MONO#: 0.3 10*3/uL (ref 0.1–0.9)
MONO%: 9 % (ref 0.0–14.0)
NEUT#: 1.3 10*3/uL — ABNORMAL LOW (ref 1.5–6.5)
NEUT%: 42.7 % (ref 38.4–76.8)
Platelets: 125 10*3/uL — ABNORMAL LOW (ref 145–400)
RBC: 4.08 10*6/uL (ref 3.70–5.45)
RDW: 12.5 % (ref 11.2–14.5)
WBC: 3 10*3/uL — ABNORMAL LOW (ref 3.9–10.3)
lymph#: 1.4 10*3/uL (ref 0.9–3.3)

## 2017-08-16 MED ORDER — INFLUENZA VAC SPLIT QUAD 0.5 ML IM SUSY
0.5000 mL | PREFILLED_SYRINGE | Freq: Once | INTRAMUSCULAR | Status: AC
  Administered 2017-08-16: 0.5 mL via INTRAMUSCULAR
  Filled 2017-08-16: qty 0.5

## 2017-08-16 NOTE — Assessment & Plan Note (Signed)
she will continue current medical management. I recommend close follow-up with primary care doctor for medication adjustment.  

## 2017-08-16 NOTE — Assessment & Plan Note (Signed)
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

## 2017-08-16 NOTE — Assessment & Plan Note (Addendum)
The patient have history of chronic constipation She is now taking a lot of fiber in her diet and she is no longer constipated I recommend she continues the same

## 2017-08-16 NOTE — Assessment & Plan Note (Signed)
The patient had good response to treatment Her last PET CT scan showed residual disease but she is not symptomatic. Clinically, she have no signs or symptoms to suggest cancer recurrence Recommends return appointment in 6 months with repeat blood work, history and exam We discussed the importance of preventive care and reviewed the vaccination programs. She does not have any prior allergic reactions to influenza vaccination. She agrees to proceed with influenza vaccination today and we will administer it today at the clinic.

## 2017-08-16 NOTE — Progress Notes (Signed)
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[place])   05/18/2015 Imaging    Ct abdomen and pelvis showed reactive lymphadenopathy and stool filled colon      02/13/2016 Imaging    Ct chest showed pneumonia      05/08/2016 Imaging    screening mammogram revealed right axillary lymphadenopathy      05/12/2016 Procedure    She underwent core needle biopsy of axillary LN      05/12/2016 Pathology Results    [id number] Biopsy showed low grade non-Hodgkin B cell lymphoma, likely marginal zone lymphoma      05/15/2016 Pathology Results    Peripheral blood positive for deletion 13q      05/25/2016 PET scan    Mildly hypermetabolic mediastinal, left axillary and right inguinal lymph nodes. 2. Mild hypermetabolism in the left thyroid, nonspecific      06/27/2016 Imaging    Ct abdomen showed sigmoid volvulus resulting in large bowel obstruction. Splenomegaly, in keeping with patient's history of lymphoma.       06/30/2016 Pathology Results    Accession: [id number] pathology specimen showed low grade lymphoma      06/30/2016 Surgery    She had LAPAROSCOPIC SIGMOID COLECTOMY,  Rigid sigmoidoscopy, with Primary repair recurrent incisional hernia         11/13/2016 PET scan    No evidence disease progression. 2. Mild decrease in metabolic activity in axillary lymph nodes. 3. Similar metabolic activity of small mediastinal lymph nodes [place] 3 . 4. Similar activity in inguinal lymph nodes. 5. No abnormal bowel activity identified       INTERVAL HISTORY: Please see below for problem oriented charting. She  returns with her daughter for further follow-up She denies recent infection No new lymphadenopathy She has gained 10 pounds since I saw her due to increased dietary supplement She is taking a lot of fiber in her diet to avoid severe constipation and it is helpful  REVIEW OF SYSTEMS:   Constitutional: Denies fevers, chills or abnormal weight loss Eyes: Denies blurriness of vision Ears, nose, mouth, throat, and face: Denies mucositis or sore throat Respiratory: Denies cough, dyspnea or wheezes Cardiovascular: Denies palpitation, chest discomfort or lower extremity swelling Gastrointestinal:  Denies nausea, heartburn or change in bowel habits Skin: Denies abnormal skin rashes Lymphatics: Denies new lymphadenopathy or easy bruising Neurological:Denies numbness, tingling or new weaknesses Behavioral/Psych: Mood is stable, no new changes  All other systems were reviewed with the patient and are negative.  I have reviewed the past medical history, past surgical history, social history and family history with the patient and they are unchanged from previous note.  ALLERGIES:  is allergic to morphine and related.  MEDICATIONS:  Current Outpatient Prescriptions  Medication Sig Dispense Refill  . cetirizine (ZYRTEC) 10 MG tablet Take 10 mg by mouth daily  5  . Dextromethorphan-Guaifenesin 5-100 MG/5ML LIQD 10 - 20 ml every 12 hours as needed  0  . dicyclomine (BENTYL) 10 MG capsule 1 cap by mouth every 6 hours as needed for abdominal cramping. 30 capsule 2  . furosemide (LASIX) 20 MG tablet Take 20 mg by mouth daily as needed for fluid.    Marland Kitchen  lisinopril (PRINIVIL,ZESTRIL) 20 MG tablet Take 20 mg by mouth daily.     Marland Kitchen LOTEMAX 0.5 % ophthalmic suspension Place 1 drop into the left eye daily.     . methocarbamol (ROBAXIN) 500 MG tablet Take 1 tablet (500 mg total) by mouth every 6 (six) hours as needed for muscle spasms. 80 tablet 0  . pantoprazole (PROTONIX) 40 MG tablet Take 1 tablet (40 mg  total) by mouth daily. 30 tablet 0  . polyethylene glycol (MIRALAX / GLYCOLAX) packet Take 17 g by mouth 2 (two) times daily. 14 each 0  . sertraline (ZOLOFT) 25 MG tablet TAKE 1 TABLET BY MOUTH DAILY 30 tablet 0  . traMADol (ULTRAM) 50 MG tablet Take 1 tablet (50 mg total) by mouth every 6 (six) hours as needed for moderate pain or severe pain. 60 tablet 0   No current facility-administered medications for this visit.     PHYSICAL EXAMINATION: ECOG PERFORMANCE STATUS: 2 - Symptomatic, <50% confined to bed  Vitals:   08/16/17 0944  BP: (!) 176/73  Pulse: 67  Resp: 16  Temp: 97.9 F (36.6 C)  SpO2: 97%   Filed Weights   08/16/17 0944  Weight: 146 lb 11.2 oz (66.5 kg)    GENERAL:alert, no distress and comfortable.  Limited examination as the patient is sitting on the wheelchair SKIN: skin color, texture, turgor are normal, no rashes or significant lesions EYES: normal, Conjunctiva are pink and non-injected, sclera clear OROPHARYNX:no exudate, no erythema and lips, buccal mucosa, and tongue normal  NECK: supple, thyroid normal size, non-tender, without nodularity LYMPH:  no palpable lymphadenopathy in the cervical, axillary or inguinal LUNGS: clear to auscultation and percussion with normal breathing effort HEART: regular rate & rhythm and no murmurs and no lower extremity edema ABDOMEN:abdomen soft, non-tender and normal bowel sounds Musculoskeletal:no cyanosis of digits and no clubbing  NEURO: alert & oriented x 3 with fluent speech, no focal motor/sensory deficits  LABORATORY DATA:  I have reviewed the data as listed    Component Value Date/Time   NA 136 03/05/2017 1759   NA 141 02/19/2017 1111   K 4.6 03/05/2017 1759   K 4.3 02/19/2017 1111   CL 106 03/05/2017 1759   CO2 25 03/05/2017 1759   CO2 26 02/19/2017 1111   GLUCOSE 75 03/05/2017 1759   GLUCOSE 72 02/19/2017 1111   BUN 17 03/05/2017 1759   BUN 16.7 02/19/2017 1111   CREATININE 1.02 (H) 03/05/2017 1759    CREATININE 0.9 02/19/2017 1111   CALCIUM 9.7 03/05/2017 1759   CALCIUM 10.2 02/19/2017 1111   PROT 7.1 03/05/2017 1759   PROT 7.5 02/19/2017 1111   PROT 7.1 02/19/2017 1111   ALBUMIN 4.0 03/05/2017 1759   ALBUMIN 3.8 02/19/2017 1111   AST 18 03/05/2017 1759   AST 14 02/19/2017 1111   ALT 12 (L) 03/05/2017 1759   ALT 8 02/19/2017 1111   ALKPHOS 76 03/05/2017 1759   ALKPHOS 92 02/19/2017 1111   BILITOT 0.4 03/05/2017 1759   BILITOT 0.36 02/19/2017 1111   GFRNONAA 49 (L) 03/05/2017 1759   GFRAA 56 (L) 03/05/2017 1759    No results found for: SPEP, UPEP  Lab Results  Component Value Date   WBC 3.0 (L) 08/16/2017   NEUTROABS 1.3 (L) 08/16/2017   HGB 11.7 08/16/2017   HCT 35.6 08/16/2017   MCV 87.3 08/16/2017   PLT 125 (L) 08/16/2017      Chemistry      Component Value Date/Time  NA 136 03/05/2017 1759   NA 141 02/19/2017 1111   K 4.6 03/05/2017 1759   K 4.3 02/19/2017 1111   CL 106 03/05/2017 1759   CO2 25 03/05/2017 1759   CO2 26 02/19/2017 1111   BUN 17 03/05/2017 1759   BUN 16.7 02/19/2017 1111   CREATININE 1.02 (H) 03/05/2017 1759   CREATININE 0.9 02/19/2017 1111      Component Value Date/Time   CALCIUM 9.7 03/05/2017 1759   CALCIUM 10.2 02/19/2017 1111   ALKPHOS 76 03/05/2017 1759   ALKPHOS 92 02/19/2017 1111   AST 18 03/05/2017 1759   AST 14 02/19/2017 1111   ALT 12 (L) 03/05/2017 1759   ALT 8 02/19/2017 1111   BILITOT 0.4 03/05/2017 1759   BILITOT 0.36 02/19/2017 1111      ASSESSMENT & PLAN:  Marginal zone lymphoma of axilla ([facility]) The patient had good response to treatment Her last PET CT scan showed residual disease but she is not symptomatic. Clinically, she have no signs or symptoms to suggest cancer recurrence Recommends return appointment in 6 months with repeat blood work, history and exam We discussed the importance of preventive care and reviewed the vaccination programs. She does not have any prior allergic reactions to influenza  vaccination. She agrees to proceed with influenza vaccination today and we will administer it today at the clinic.   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 I recommend close follow-up with primary care doctor for medication adjustment.    No orders of the defined types were placed in this encounter.  All questions were answered. The patient knows to call the clinic with any problems, questions or concerns. No barriers to learning was detected. I spent 15 minutes counseling the patient face to face. The total time spent in the appointment was 20 minutes and more than 50% was on counseling and review of test results     Heath Lark, MD 08/16/2017 10:15 AM

## 2017-08-16 NOTE — Telephone Encounter (Signed)
Gave avs and calendar for April 2019 °

## 2017-08-23 ENCOUNTER — Encounter: Payer: Self-pay | Admitting: Internal Medicine

## 2017-08-23 ENCOUNTER — Ambulatory Visit (INDEPENDENT_AMBULATORY_CARE_PROVIDER_SITE_OTHER): Payer: Self-pay | Admitting: Internal Medicine

## 2017-08-23 VITALS — BP 150/70 | HR 62 | Resp 12 | Wt 139.0 lb

## 2017-08-23 DIAGNOSIS — J0100 Acute maxillary sinusitis, unspecified: Secondary | ICD-10-CM

## 2017-08-23 DIAGNOSIS — G4700 Insomnia, unspecified: Secondary | ICD-10-CM

## 2017-08-23 DIAGNOSIS — R600 Localized edema: Secondary | ICD-10-CM

## 2017-08-23 DIAGNOSIS — Z8639 Personal history of other endocrine, nutritional and metabolic disease: Secondary | ICD-10-CM

## 2017-08-23 DIAGNOSIS — Z79899 Other long term (current) drug therapy: Secondary | ICD-10-CM

## 2017-08-23 DIAGNOSIS — R109 Unspecified abdominal pain: Secondary | ICD-10-CM

## 2017-08-23 DIAGNOSIS — I10 Essential (primary) hypertension: Secondary | ICD-10-CM

## 2017-08-23 DIAGNOSIS — J3089 Other allergic rhinitis: Secondary | ICD-10-CM

## 2017-08-23 MED ORDER — AMOXICILLIN 500 MG PO CAPS: 500.0000 mg | ORAL_CAPSULE | Freq: Three times a day (TID) | ORAL | 0 refills | Status: DC

## 2017-08-23 MED ORDER — CETIRIZINE HCL 10 MG PO TABS: ORAL_TABLET | ORAL | 11 refills | Status: DC

## 2017-08-23 NOTE — Patient Instructions (Signed)
Go to bed at 11 p.m. And up at 7 a.m. No tv, computer, puzzles, busy work in the [employer] during day. Read if unable to get to sleep in 20 minutes

## 2017-08-23 NOTE — Progress Notes (Signed)
Subjective:    Patient ID: Erika Day, female    DOB: Feb 25, 1930, 83 y.o.   MRN: [id number]  HPI   1.  Lymphoma:  Followed by Dr. Alvy Bimler, Heme/Onc.  Recent CBC without significant concerns.   2.  Abdominal cramping:  Controlled now with antispasmodic.  This started after the sigmoid colectomy for volvulus in August of 2017.   Does have mushy stools with more particulate matter mixed in at times, but no watery stools.  3. Decreased appetite past few days or so.  This comes and goes.  Was on an off brand Ensure, but felt she was getting too fat in the middle, so she stopped the Ensure.   Different scales show a drop from 146 lbs to 139.  Was not weighed per patient without shoes and had on heavier clothes at other office.   4.  Insomnia:  Sleeps all afternoon until 11 p.m.  Unable then to sleep at night.  Gets up and cleans, crossword puzzles, sews. Watches TV.  No set bedtime or wake time.  Rarely outside.  Occasionally does chair exercises, but performs those at night when cannot sleep.  If she does these, can have leg pain that also keeps her awake. Does like to read. Drinks limited caffeine.  5.  History of  Depression:  No longer taking Sertraline, which discussed is fine as she does not feel depressed.  6.  Congestion:  Not taking Zyrtec.  Is having itchy eyes, nose, throat.  Congested cough.  Cough and nose with clear to light yellow mucous. No fevers or dyspnea.  Has had this for a couple of weeks  7.  Hypertension:  Has not had meds today as she did not want to eat before coming in--thought she would fall asleep.  8.  History of hyperglycemia/patient stated DM was a misdiagnosis years ago:  A1C has been in 5.4% in past 2 years.    9.  Wheelchair bound and with kyphosis:  States the kyphosis started when she began walking bent over with a walker years ago.   She did undergo home PT following her hip fracture last year and was ambulating.  Sounds like she went back to the  wheelchair subsequently.  She would like to work on getting ambulatory again.  Current Meds  Medication Sig  . cetirizine (ZYRTEC) 10 MG tablet 1 tab by mouth daily for allergy symptoms  . Dextromethorphan-Guaifenesin 5-100 MG/5ML LIQD 10 - 20 ml every 12 hours as needed  . dicyclomine (BENTYL) 10 MG capsule 1 cap by mouth every 6 hours as needed for abdominal cramping.  . furosemide (LASIX) 20 MG tablet Take 20 mg by mouth daily as needed for fluid.  Marland Kitchen lisinopril (PRINIVIL,ZESTRIL) 20 MG tablet Take 20 mg by mouth daily.   Marland Kitchen LOTEMAX 0.5 % ophthalmic suspension Place 1 drop into the left eye daily.   . methocarbamol (ROBAXIN) 500 MG tablet Take 1 tablet (500 mg total) by mouth every 6 (six) hours as needed for muscle spasms.  . pantoprazole (PROTONIX) 40 MG tablet Take 1 tablet (40 mg total) by mouth daily.  . traMADol (ULTRAM) 50 MG tablet Take 1 tablet (50 mg total) by mouth every 6 (six) hours as needed for moderate pain or severe pain.  . [DISCONTINUED] cetirizine (ZYRTEC) 10 MG tablet Take 10 mg by mouth daily  . [DISCONTINUED] sertraline (ZOLOFT) 25 MG tablet TAKE 1 TABLET BY MOUTH DAILY    Allergies  Allergen Reactions  . Morphine  And Related Other (See Comments)    Blood sugar dropped one-time     Review of Systems     Objective:   Physical Exam Hunched forward in wheelchair. NAD HEENT: eyes watery with yawning, no conjunctival injection, voice a bit hoarse.  TMs pearly gray, throat without injection.  Nasal mucosa red with crusty yellow discharge.  Tender over maxillary sinuses bilaterally. Neck:  Supple, no adenopathy.  Kyphosis really seems most prominent in cervical spine. Chest:  CTA CV:  RRR with grade II/VI  SEM, LSB.  Radial pulses normal and equal. Abd:  S, NT, difficult to examine in chair as bent forward.  + BS LE:  Bilateral pitting edema, left much more so than right to mid pretibial area.        Assessment & Plan:  1.  Hypertension:  Did not take meds  today yet.  Discussed to take regularly, though understand not taking as she felt she would fall asleep after eating.  Will have Emmet check her bp at home.  2.  Insomnia:  Long discussion regarding avoiding activities she is doing at night that will keep her awake.  Encouraged and outing to [employer] once weekly to get books to read instead.   No caffeine after noon Regular sleep and wake hours. Outside on porch during sunshine for chair exercises daily.  3.  Poor ambulation:  PT once has time to work on sleep.  4.  Abdominal cramping: controlled with antispasmodic  5.  Sinusitis:  Amoxicillin 500 mg 3 times  daily for 10 days.  May also have allergy symptoms adding to this--refilled Zyrtec.  6.  Weight:  Will have her come back for weight check or have HOT nurse do this in next 2 weeks.  7.  Leg edema:  Elevation of legs.

## 2017-08-24 LAB — COMPREHENSIVE METABOLIC PANEL
ALT: 6 IU/L (ref 0–32)
AST: 16 IU/L (ref 0–40)
Albumin/Globulin Ratio: 1.3 (ref 1.2–2.2)
Albumin: 4.1 g/dL (ref 3.5–4.7)
Alkaline Phosphatase: 75 IU/L (ref 39–117)
BUN/Creatinine Ratio: 17 (ref 12–28)
BUN: 18 mg/dL (ref 8–27)
Bilirubin Total: 0.3 mg/dL (ref 0.0–1.2)
CALCIUM: 10.1 mg/dL (ref 8.7–10.3)
CO2: 22 mmol/L (ref 20–29)
Chloride: 100 mmol/L (ref 96–106)
Creatinine, Ser: 1.06 mg/dL — ABNORMAL HIGH (ref 0.57–1.00)
GFR calc Af Amer: 55 mL/min/{1.73_m2} — ABNORMAL LOW (ref >59)
GFR, EST NON AFRICAN AMERICAN: 48 mL/min/{1.73_m2} — AB (ref >59)
GLOBULIN, TOTAL: 3.2 g/dL (ref 1.5–4.5)
Glucose: 85 mg/dL (ref 65–99)
Potassium: 4.8 mmol/L (ref 3.5–5.2)
SODIUM: 138 mmol/L (ref 134–144)
Total Protein: 7.3 g/dL (ref 6.0–8.5)

## 2017-11-02 ENCOUNTER — Encounter (HOSPITAL_COMMUNITY): Payer: Self-pay

## 2017-11-02 ENCOUNTER — Emergency Department (HOSPITAL_COMMUNITY)
Admission: EM | Admit: 2017-11-02 | Discharge: 2017-11-02 | Disposition: A | Discharge status: Home / Self Care | Payer: Medicare Other | Attending: Emergency Medicine | Admitting: Emergency Medicine

## 2017-11-02 DIAGNOSIS — Z87891 Personal history of nicotine dependence: Secondary | ICD-10-CM | POA: Diagnosis not present

## 2017-11-02 DIAGNOSIS — R600 Localized edema: Secondary | ICD-10-CM | POA: Diagnosis present

## 2017-11-02 DIAGNOSIS — I10 Essential (primary) hypertension: Secondary | ICD-10-CM | POA: Insufficient documentation

## 2017-11-02 DIAGNOSIS — E119 Type 2 diabetes mellitus without complications: Secondary | ICD-10-CM | POA: Insufficient documentation

## 2017-11-02 DIAGNOSIS — R609 Edema, unspecified: Secondary | ICD-10-CM | POA: Diagnosis not present

## 2017-11-02 DIAGNOSIS — L03116 Cellulitis of left lower limb: Secondary | ICD-10-CM | POA: Insufficient documentation

## 2017-11-02 DIAGNOSIS — Z79899 Other long term (current) drug therapy: Secondary | ICD-10-CM | POA: Diagnosis not present

## 2017-11-02 LAB — CBC WITH DIFFERENTIAL/PLATELET
BASOS PCT: 0 %
Basophils Absolute: 0 10*3/uL (ref 0.0–0.1)
EOS ABS: 0.1 10*3/uL (ref 0.0–0.7)
Eosinophils Relative: 4 %
HEMATOCRIT: 34.2 % — AB (ref 36.0–46.0)
Hemoglobin: 11.1 g/dL — ABNORMAL LOW (ref 12.0–15.0)
LYMPHS ABS: 1.3 10*3/uL (ref 0.7–4.0)
Lymphocytes Relative: 49 %
MCH: 29.2 pg (ref 26.0–34.0)
MCHC: 32.5 g/dL (ref 30.0–36.0)
MCV: 90 fL (ref 78.0–100.0)
MONO ABS: 0.2 10*3/uL (ref 0.1–1.0)
MONOS PCT: 7 %
Neutro Abs: 1.1 10*3/uL — ABNORMAL LOW (ref 1.7–7.7)
Neutrophils Relative %: 40 %
Platelets: 135 10*3/uL — ABNORMAL LOW (ref 150–400)
RBC: 3.8 MIL/uL — ABNORMAL LOW (ref 3.87–5.11)
RDW: 13.6 % (ref 11.5–15.5)
WBC: 2.6 10*3/uL — ABNORMAL LOW (ref 4.0–10.5)

## 2017-11-02 LAB — COMPREHENSIVE METABOLIC PANEL
ALBUMIN: 3.9 g/dL (ref 3.5–5.0)
ALK PHOS: 61 U/L (ref 38–126)
ALT: 11 U/L — ABNORMAL LOW (ref 14–54)
AST: 19 U/L (ref 15–41)
Anion gap: 4 — ABNORMAL LOW (ref 5–15)
BUN: 12 mg/dL (ref 6–20)
CALCIUM: 10 mg/dL (ref 8.9–10.3)
CO2: 26 mmol/L (ref 22–32)
Chloride: 108 mmol/L (ref 101–111)
Creatinine, Ser: 0.92 mg/dL (ref 0.44–1.00)
GFR calc Af Amer: >60 mL/min (ref >60)
GFR calc non Af Amer: 55 mL/min — ABNORMAL LOW (ref >60)
GLUCOSE: 102 mg/dL — AB (ref 65–99)
Potassium: 4.7 mmol/L (ref 3.5–5.1)
SODIUM: 138 mmol/L (ref 135–145)
Total Bilirubin: 0.7 mg/dL (ref 0.3–1.2)
Total Protein: 7.4 g/dL (ref 6.5–8.1)

## 2017-11-02 LAB — I-STAT CG4 LACTIC ACID, ED: Lactic Acid, Venous: 0.57 mmol/L (ref 0.5–1.9)

## 2017-11-02 MED ORDER — CEPHALEXIN 500 MG PO CAPS: ORAL_CAPSULE | ORAL | 0 refills | Status: DC

## 2017-11-02 NOTE — ED Provider Notes (Signed)
[facility] EMERGENCY DEPARTMENT Provider Note   CSN: [id number] Arrival date & time: 11/02/17  1026     History   Chief Complaint Chief Complaint  Patient presents with  . ankle wound/pain    HPI Erika Day is a 84 y.o. female.who presents for BL LE edema. She has a history of chronic Peripheral edema without a hx of CHF.  She is treated for hypertension.  Patient states that over the past few weeks she has had much worsening bilateral lower extremity peripheral edema.  This week she was also having weeping from her lower extremities.  Her family became concerned and brought her here.  Last night she elevated her legs and has had significant decrease in the amount of swelling.  Patient states that she normally sleeps with her legs hanging off the bed because it is more comfortable for her.  She is able to lie flat.  She denies orthopnea PND.  She denies any shortness of breath.  She has some soreness in her lower extremities, weeping has resolved.  She denies any fevers or chills.  HPI  Past Medical History:  Diagnosis Date  . Acid reflux disease   . Cancer ([place])   . CLL (chronic lymphocytic leukemia) ([place]) 05/15/2016  . DDD (degenerative disc disease), lumbar   . Degenerative joint disease   . Diabetes mellitus without complication [facility])    Patient states this was a misdiagnosis from years ago  . Dyslipidemia   . History of diabetes mellitus 04/19/2017  . Hypertension   . 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 Panic attacks   . Pneumonia 02/2016  . 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 Stroke [facility]) age 36 yo   poorly controlled DM and Hypertension    Patient Active Problem List   Diagnosis Date Noted  . History of diabetes mellitus 04/19/2017  . Hypercalcemia 02/12/2017  . Chronic leukopenia 02/12/2017  . Postoperative anemia due to acute blood loss 11/26/2016  . Fall 11/23/2016  . Closed fracture of  intertrochanteric section of femur ([place]) 11/23/2016  . Other pancytopenia ([place]) 11/13/2016  . Sinusitis 10/14/2016  . Bleeding external hemorrhoids 08/20/2016  . Recurrent ventral incisional hernia s/p primary repair 06/30/2016 07/01/2016  . Pressure ulcer 05/20/2016  . Lower extremity edema 05/20/2016  . Protein-calorie malnutrition, severe ([place]) 05/20/2016  . Volvulus of sigmoid colon ([place])   . Sigmoid volvulus s/p lap sigmoid colectomy 06/30/2016 05/19/2016  . 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 Other fatigue 05/15/2016  . CAP (community acquired pneumonia) 02/13/2016  . Anemia, unspecified 02/13/2016  . Thrombocytopenia ([place]) 02/13/2016  . Hyponatremia 02/13/2016  . Kyphosis 02/01/2016  . Pressure ulcer of coccygeal region 01/31/2016  . Mobility impaired 01/31/2016  . Osteoarthritis 01/31/2016  . Abdominal cramping 04/23/2014  . Essential hypertension 09/06/2011  . Ovarian torsion 08/25/2011    Past Surgical History:  Procedure Laterality Date  . ABDOMINAL HYSTERECTOMY  ? 08/25/2011 ?   Has BSO for left benign ovarian tumor with torsion and broad ligament benign tumor, but does not appear had hysterectomy--[facility], NOT ovarian cancer  . CHOLECYSTECTOMY    . FEMUR IM NAIL Left 11/23/2016   Procedure: INTRAMEDULLARY (IM) NAIL FEMORAL;  Surgeon: Gaynelle Arabian, MD;  Location: [facility];  Service: Orthopedics;  Laterality: Left;  . FLEXIBLE SIGMOIDOSCOPY N/A 05/19/2016   Procedure: FLEXIBLE SIGMOIDOSCOPY;  Surgeon:  [name], MD;  Location: [facility];  Service: Endoscopy;  Laterality: N/A;  . FLEXIBLE SIGMOIDOSCOPY N/A 06/28/2016   Procedure: FLEXIBLE SIGMOIDOSCOPY;  Surgeon: Ladene Artist, MD;  Location: [facility];  Service: Endoscopy;  Laterality: N/A;  . LAPAROSCOPIC SIGMOID COLECTOMY N/A 06/30/2016   Procedure: LAPAROSCOPIC SIGMOID COLECTOMY,rigid sigmoidoscopy, repair recurrent incisional  hernia;  Surgeon: Michael Boston, MD;  Location: [facility];  Service: General;  Laterality: N/A;  . LAPAROTOMY N/A 04/29/2014   Procedure: EXPLORATORY LAPAROTOMY;  Surgeon: Imogene Burn. Georgette Dover, MD;  Location: [place];  Service: General;  Laterality: N/A;  . LYSIS OF ADHESION N/A 04/29/2014   Procedure: EXTENSIVE LYSIS OF ADHESIONS;  Surgeon: Imogene Burn. Georgette Dover, MD;  Location: [place];  Service: General;  Laterality: N/A;  . VENTRAL HERNIA REPAIR N/A 04/29/2014   Procedure: PRIMARY REPAIR OF VENTRAL HERNIA;  Surgeon: Imogene Burn. Georgette Dover, MD;  Location: [place];  Service: General;  Laterality: N/A;    OB History    No data available       Home Medications    Prior to Admission medications   Medication Sig Start Date End Date Taking? Authorizing Provider  amoxicillin (AMOXIL) 500 MG capsule Take 1 capsule (500 mg total) by mouth 3 (three) times daily. 08/23/17   Mack Hook, MD  cetirizine (ZYRTEC) 10 MG tablet 1 tab by mouth daily for allergy symptoms 08/23/17   Mack Hook, MD  Dextromethorphan-Guaifenesin 5-100 MG/5ML LIQD 10 - 20 ml every 12 hours as needed 04/19/17   Mack Hook, MD  dicyclomine (BENTYL) 10 MG capsule 1 cap by mouth every 6 hours as needed for abdominal cramping. 06/11/17   Mack Hook, MD  furosemide (LASIX) 20 MG tablet Take 20 mg by mouth daily as needed for fluid.    [provider]  lisinopril (PRINIVIL,ZESTRIL) 20 MG tablet Take 20 mg by mouth daily.  01/20/17   [provider]  LOTEMAX 0.5 % ophthalmic suspension Place 1 drop into the left eye daily.  11/03/16   [provider]  methocarbamol (ROBAXIN) 500 MG tablet Take 1 tablet (500 mg total) by mouth every 6 (six) hours as needed for muscle spasms. 11/26/16   Perkins, Alexzandrew L, PA-C  pantoprazole (PROTONIX) 40 MG tablet Take 1 tablet (40 mg total) by mouth daily. 06/05/16   Heath Lark, MD  polyethylene glycol (MIRALAX / GLYCOLAX) packet Take 17 g by mouth 2 (two) times  daily. Patient not taking: Reported on 08/23/2017 05/22/16   Charlynne Cousins, MD  traMADol (ULTRAM) 50 MG tablet Take 1 tablet (50 mg total) by mouth every 6 (six) hours as needed for moderate pain or severe pain. 11/13/16   Heath Lark, MD    Family History Family History  Problem Relation Age of Onset  . Hypertension Mother   . Stroke Mother   . Arthritis Mother   . Alcohol abuse Father     Social History Social History   Tobacco Use  . Smoking status: Former Smoker    Types: Cigarettes    Last attempt to quit: 02/18/1990    Years since quitting: 27.7  . Smokeless tobacco: Former Systems developer    Types: Snuff    Quit date: 02/18/1990  Substance Use Topics  . Alcohol use: No    Alcohol/week: 0.0 oz    Comment: Only drank alcohol between ages of 51 and 89 yo.  Her father gave it to her  . Drug use: No     Allergies   Morphine and  related   Review of Systems Review of Systems Ten systems reviewed and are negative for acute change, except as noted in the HPI.   Physical Exam Updated Vital Signs BP (!) 190/81 (BP Location: Right Arm)   Pulse 61   Temp 98.7 F (37.1 C) (Oral)   Resp 14   SpO2 98%   Physical Exam  Constitutional: She is oriented to person, place, and time. She appears well-developed and well-nourished. No distress.  HENT:  Head: Normocephalic and atraumatic.  Eyes: Conjunctivae are normal. No scleral icterus.  Neck: Normal range of motion.  Cardiovascular: Normal rate, regular rhythm and normal heart sounds. Exam reveals no gallop and no friction rub.  No murmur heard. Pulmonary/Chest: Effort normal and breath sounds normal. No respiratory distress.  Abdominal: Soft. Bowel sounds are normal. She exhibits no distension and no mass. There is no tenderness. There is no guarding.  Musculoskeletal:  Bilateral minimally swollen lower extremities.  Minimal erythema around scratch on the left leg.  Neurological: She is alert and oriented to person, place, and  time.  Skin: Skin is warm and dry. She is not diaphoretic.  Psychiatric: Her behavior is normal.  Nursing note and vitals reviewed.    ED Treatments / Results  Labs (all labs ordered are listed, but only abnormal results are displayed) Labs Reviewed  COMPREHENSIVE METABOLIC PANEL - Abnormal; Notable for the following components:      Result Value   Glucose, Bld 102 (*)    ALT 11 (*)    GFR calc non Af Amer 55 (*)    Anion gap 4 (*)    All other components within normal limits  CBC WITH DIFFERENTIAL/PLATELET - Abnormal; Notable for the following components:   WBC 2.6 (*)    RBC 3.80 (*)    Hemoglobin 11.1 (*)    HCT 34.2 (*)    Platelets 135 (*)    Neutro Abs 1.1 (*)    All other components within normal limits  I-STAT CG4 LACTIC ACID, ED    EKG  EKG Interpretation None       Radiology No results found.  Procedures Procedures (including critical care time)  Medications Ordered in ED Medications - No data to display   Initial Impression / Assessment and Plan / ED Course  I have reviewed the triage vital signs and the nursing notes.  Pertinent labs & imaging results that were available during my care of the patient were reviewed by me and considered in my medical decision making (see chart for details).     Patient with bilateral lower extremity peripheral edema, minimal swelling patient seen and shared visit with Dr. Sabra Heck.  She appears appropriate for discharge at this time.  Suggest compression hose.  She can follow-up with her PCP for outpatient workup for heart failure although I have minimal suspicion for this.  She may have some early cellulitis and will be treated with Keflex.  Patient has a history of chronic hypertension without any symptoms at this time.  She appears appropriate for discharge.  Final Clinical Impressions(s) / ED Diagnoses   Final diagnoses:  Peripheral edema  Cellulitis of left lower extremity    ED Discharge Orders    None        Margarita Mail, PA-C 11/03/17 0009    Noemi Chapel, MD 11/08/17 1026

## 2017-11-02 NOTE — ED Provider Notes (Signed)
Medical screening examination/treatment/procedure(s) were conducted as a shared visit with non-physician practitioner(s) and myself.  I personally evaluated the patient during the encounter.  Clinical Impression:   Final diagnoses:  None   Comes in with lower extermity edema for some time - years - has had prior fracture of the L leg (chronic swelling > R).  They have been weeping - husband told her that maggots may come out if they get infected - swelling came down with keeping them elevated last night - small area of redness on one leg.    Minimal swelling Has missed lasix X 3 days in a row Well appaering otherwise with clear lungs Labs without significant anemia      Erika Chapel, MD 11/08/17 1026

## 2017-11-02 NOTE — ED Triage Notes (Signed)
Patient complains of right lower leg pain and redness with small open wounds-appears these are scabs that have opened. Denies fever, denies injury.

## 2017-11-02 NOTE — Discharge Instructions (Signed)
Elevate your legs every night.  You should apply compression stockings to the lower legs and wear them daily.  We are treating you for infection of the skin on the lower left leg.  Please see Dr. Amil Amen in the next 2-3 days.  Return to the emergency department if you develop any shortness of breath, difficulty breathing when lying back or flat, fever or chills.

## 2017-11-03 ENCOUNTER — Telehealth: Payer: Self-pay | Admitting: Internal Medicine

## 2017-11-04 NOTE — Telephone Encounter (Signed)
Spoke with daughter and patient has been elevating legs and she did go and buy compression hose from pharmacy. Patient scheduled to come in for appointment on 11/05/17 @ 3:30 pm.

## 2017-11-05 ENCOUNTER — Ambulatory Visit: Payer: Medicare Other | Admitting: Internal Medicine

## 2017-11-05 ENCOUNTER — Encounter: Payer: Self-pay | Admitting: Internal Medicine

## 2017-11-05 VITALS — BP 130/82 | HR 70 | Resp 12

## 2017-11-05 DIAGNOSIS — R609 Edema, unspecified: Secondary | ICD-10-CM

## 2017-11-05 DIAGNOSIS — S81811A Laceration without foreign body, right lower leg, initial encounter: Secondary | ICD-10-CM

## 2017-11-05 MED ORDER — MEDICAL COMPRESSION STOCKINGS MISC: 1 refills | Status: DC

## 2017-11-05 MED ORDER — FUROSEMIDE 20 MG PO TABS: 20.0000 mg | ORAL_TABLET | Freq: Every day | ORAL | 11 refills | Status: DC | PRN

## 2017-11-05 NOTE — Progress Notes (Signed)
Subjective:    Patient ID: Erika Day, female    DOB: 09-Apr-1930, 84 y.o.   MRN: [id number]  HPI   Here with husband, [name] and daughter, [name] for followup after visit to ED for increasing peripheral edema of legs with skin breakdown and weeping per daughter. Has had intermittently worsening peripheral edema of legs and family was concerned with how her skin looked, so took her to ED on Jan. 1.   Denies PND or orthopnea symptoms.  No CP or cough Was drinking Ginger Ale and Root Beer up to 3 times daily.  Cheese frequently as well.   Denies any increased sodium intake with holiday food. She also has an odd way of sleeping on her bed at home, which I believe is a hospital bed from previous visits, with her legs dangling over the edge as she feels this is more comfortable. She states she has been sleeping with her legs up on mattress now and the swelling is improved.   Was given Rx for Cephalexin, which she is taking since ED visit in case of cellulitis, though erythematous skin changes felt to probably be just from tension from underlying edema. She has Furosemide 20 mg from over a year ago to use as needed, but does not appear she has been using this. She did obtain knee high compression stockings as I had discussed over the phone with [name], but tore her skin on the right posterior high ankle area when trying to get them off. [name] is also concerned as Cori Razor has poor skin care of her feet and lower legs with dry brown patches all over her feet and ankles as well as the weeping. Home aide does not seem to understand how to care for this and was trying to rub her legs with rubbing alcohol.  Current Meds  Medication Sig  . cephALEXin (KEFLEX) 500 MG capsule 2 caps po bid x 7 days  . cetirizine (ZYRTEC) 10 MG tablet 1 tab by mouth daily for allergy symptoms  . dicyclomine (BENTYL) 10 MG capsule 1 cap by mouth every 6 hours as needed for abdominal cramping.  . furosemide (LASIX) 20 MG  tablet Take 1 tablet (20 mg total) by mouth daily as needed for fluid.  Marland Kitchen lisinopril (PRINIVIL,ZESTRIL) 20 MG tablet Take 20 mg by mouth daily.   Marland Kitchen LOTEMAX 0.5 % ophthalmic suspension Place 1 drop into the left eye daily.   . methocarbamol (ROBAXIN) 500 MG tablet Take 1 tablet (500 mg total) by mouth every 6 (six) hours as needed for muscle spasms.  . pantoprazole (PROTONIX) 40 MG tablet Take 1 tablet (40 mg total) by mouth daily.  . [DISCONTINUED] furosemide (LASIX) 20 MG tablet Take 20 mg by mouth daily as needed for fluid.   Allergies  Allergen Reactions  . Morphine And Related Other (See Comments)    Blood sugar dropped one-time        Review of Systems     Objective:   Physical Exam NAD Hunched in wheelchair as per her usual posture. Lungs:  Bibasilar dry crackles that improve with cough.  Otherwise clear.  Bases resonant to percussion. CV:  RRR without murmur or rub, radial pulses normal and equal.  DP pulse palpable and equal Abd:  S, NT, No HSM or mass, + BS LE:  Moderate pitting edema to just below knees with mild erythema.  No current weeping, though small tear to skin on posterior high ankle/low calf of right lower leg.  Feet and ankles with adherent dry greasy brown patches.  Feet and toes also with edema.       Assessment & Plan:  1.  Peripheral Edema with some skin breakdown:  Essentially wheelchair bound.  Would be beneficial to have skin care and family/aid instruction on skin care, instruction on low sodium diet and first awakening measurement of legs for 20-30 mm Hg graduated compression stockings, plus ordering of stockings patient can take on and off fairly easily without causing more skin injury. Referral to [facility]. To read labels and limit sodium intake to 4 g to start Elevate legs in reclined position when sitting. Elevate legs when sleeping. Furosemide 20 mg daily in the morning--has commode in room Finish Cephalexin, but skin erythema from  underlying edema Return in 1 week for BMP and recheck of leg  2.  Skin care/skin tear concerns:  As in #1.  Does not appear she is getting appropriate cleaning of feet and legs or care of skin.

## 2017-11-05 NOTE — Patient Instructions (Addendum)
Eating a banana daily Read labels and only 4g or 4000 mg of sodium daily.   Avoid sodas, canned veggies, soups When sitting, elevate legs and recline Elevate legs when sleeping [facility] for skin care to legs, instruction on low sodium diet, measurement and fill for 20-30 mm HG thigh high compression stockings

## 2017-11-12 ENCOUNTER — Ambulatory Visit (INDEPENDENT_AMBULATORY_CARE_PROVIDER_SITE_OTHER): Payer: Medicare Other | Admitting: Internal Medicine

## 2017-11-12 ENCOUNTER — Encounter: Payer: Self-pay | Admitting: Internal Medicine

## 2017-11-12 VITALS — BP 122/78 | HR 74 | Resp 12

## 2017-11-12 DIAGNOSIS — R609 Edema, unspecified: Secondary | ICD-10-CM

## 2017-11-12 NOTE — Progress Notes (Signed)
   Subjective:    Patient ID: Erika Day, female    DOB: 1930/04/01, 84 y.o.   MRN: [id number]  HPI   Peripheral Edema:  Doing much better.  Have not been able to set up with home health care as of yet--coverage is limited. HOTeam nurse has been out to home and concerns again with how she sleeps with legs dangling and spending much of her time in wheelchair.   They are looking to put the leg supports back on her wheelchair so she can elevate.  Her husband has stated he thinks they are in the home. Otherwise, we may have the possibility of a donated wheelchair with leg supports. Compression stockings have not been a viable option as difficulty getting on and off without tearing skin.  Current Meds  Medication Sig  . cephALEXin (KEFLEX) 500 MG capsule 2 caps po bid x 7 days  . cetirizine (ZYRTEC) 10 MG tablet 1 tab by mouth daily for allergy symptoms  . dicyclomine (BENTYL) 10 MG capsule 1 cap by mouth every 6 hours as needed for abdominal cramping.  . furosemide (LASIX) 20 MG tablet Take 1 tablet (20 mg total) by mouth daily as needed for fluid.  Marland Kitchen lisinopril (PRINIVIL,ZESTRIL) 20 MG tablet Take 20 mg by mouth daily.   Marland Kitchen LOTEMAX 0.5 % ophthalmic suspension Place 1 drop into the left eye daily.   . methocarbamol (ROBAXIN) 500 MG tablet Take 1 tablet (500 mg total) by mouth every 6 (six) hours as needed for muscle spasms.  . pantoprazole (PROTONIX) 40 MG tablet Take 1 tablet (40 mg total) by mouth daily.   Allergies  Allergen Reactions  . Morphine And Related Other (See Comments)    Blood sugar dropped one-time     Review of Systems     Objective:   Physical Exam NAD Lungs:  CTA CV:  RRR without murmur or rub LE:  Decreased edema, decreased erythema.  Skin of feet much healthier appearing.  Small skin tear on right low leg healing nicely       Assessment & Plan:  1.  Peripheral edema:  Has been a re occurring problem for Erika Day as she scoots around with her feet in her  wheelchair, has odd hours for sleep and does not elevate her legs well even when sleeping.  Intermittently will change behavior when legs cause discomfort. Also she and her husband are soda lovers and sodium intake adds to this problem. To continue to monitor.  Take Furosemide daily. Continue HOTeam visits to encourage elevation of legs and low sodium diet.

## 2017-11-17 ENCOUNTER — Other Ambulatory Visit: Payer: Self-pay | Admitting: Internal Medicine

## 2017-11-18 LAB — BASIC METABOLIC PANEL
BUN / CREAT RATIO: 12 (ref 12–28)
BUN: 14 mg/dL (ref 8–27)
CALCIUM: 9.6 mg/dL (ref 8.7–10.3)
CO2: 24 mmol/L (ref 20–29)
CREATININE: 1.17 mg/dL — AB (ref 0.57–1.00)
Chloride: 103 mmol/L (ref 96–106)
GFR calc Af Amer: 49 mL/min/{1.73_m2} — ABNORMAL LOW (ref >59)
GFR calc non Af Amer: 43 mL/min/{1.73_m2} — ABNORMAL LOW (ref >59)
Glucose: 76 mg/dL (ref 65–99)
Potassium: 4.7 mmol/L (ref 3.5–5.2)
Sodium: 140 mmol/L (ref 134–144)

## 2017-11-23 ENCOUNTER — Ambulatory Visit: Payer: Medicare Other | Admitting: Internal Medicine

## 2017-12-07 ENCOUNTER — Other Ambulatory Visit (INDEPENDENT_AMBULATORY_CARE_PROVIDER_SITE_OTHER): Payer: Self-pay

## 2017-12-07 DIAGNOSIS — R600 Localized edema: Secondary | ICD-10-CM

## 2017-12-08 LAB — BASIC METABOLIC PANEL
BUN / CREAT RATIO: 18 (ref 12–28)
BUN: 20 mg/dL (ref 8–27)
CO2: 22 mmol/L (ref 20–29)
CREATININE: 1.11 mg/dL — AB (ref 0.57–1.00)
Calcium: 10 mg/dL (ref 8.7–10.3)
Chloride: 98 mmol/L (ref 96–106)
GFR calc Af Amer: 52 mL/min/{1.73_m2} — ABNORMAL LOW (ref >59)
GFR, EST NON AFRICAN AMERICAN: 45 mL/min/{1.73_m2} — AB (ref >59)
Glucose: 102 mg/dL — ABNORMAL HIGH (ref 65–99)
Potassium: 5.2 mmol/L (ref 3.5–5.2)
SODIUM: 135 mmol/L (ref 134–144)

## 2017-12-10 NOTE — Progress Notes (Signed)
Referral, OV notes, demographics faxed to [facility]. Nurse will review information and call back if they are able to see patient.

## 2017-12-31 ENCOUNTER — Telehealth: Payer: Self-pay

## 2017-12-31 NOTE — Telephone Encounter (Signed)
Patient and stated that she fell in her kitchen today and EMS did come. They accessed patient and asked if she wanted to go to the hospital she declined due to babysitting her grandaughter. Patient now having pain in left arm and wrist and in her right shoulder. Patient states she is generally in pain.   Spoke with Dr. Amil Amen regarding fall. Per Dr. Amil Amen patient needs to go to ER to be checked because we do not have the equipment to do x-rays here in the office. Spoke with patient and she verbalized understanding of going to ER tonight.

## 2018-01-12 ENCOUNTER — Telehealth: Payer: Self-pay

## 2018-01-12 NOTE — Telephone Encounter (Signed)
Erika Day came into office today after seeing Mrs. Stratmann. States patient is out of her Lisinopril and needs a refill and patient also taking Tramadol as needed and Rx has run out. Patient asking for a refill of this as well.  To Dr. Amil Amen for approval

## 2018-01-14 ENCOUNTER — Other Ambulatory Visit: Payer: Self-pay

## 2018-01-14 MED ORDER — LISINOPRIL 20 MG PO TABS: 20.0000 mg | ORAL_TABLET | Freq: Every day | ORAL | 11 refills | Status: DC

## 2018-01-24 ENCOUNTER — Other Ambulatory Visit: Payer: Self-pay | Admitting: Internal Medicine

## 2018-01-24 NOTE — Telephone Encounter (Signed)
She has not had Tramadol prescribed since 11/2016 after her hip fracture.  What is she needing Tramadol for at this point?

## 2018-01-24 NOTE — Progress Notes (Signed)
Has not had a tramadol Rx since after her hip fracture in Jan of 2018.  Need to find out why she is asking for Tramadol

## 2018-02-11 ENCOUNTER — Encounter: Payer: Self-pay | Admitting: Internal Medicine

## 2018-02-11 ENCOUNTER — Other Ambulatory Visit: Payer: Self-pay | Admitting: Hematology and Oncology

## 2018-02-11 ENCOUNTER — Ambulatory Visit (INDEPENDENT_AMBULATORY_CARE_PROVIDER_SITE_OTHER): Payer: Medicare Other | Admitting: Internal Medicine

## 2018-02-11 VITALS — BP 118/80 | HR 72 | Resp 12

## 2018-02-11 DIAGNOSIS — C8584 Other specified types of non-Hodgkin lymphoma, lymph nodes of axilla and upper limb: Secondary | ICD-10-CM

## 2018-02-11 DIAGNOSIS — D472 Monoclonal gammopathy: Secondary | ICD-10-CM | POA: Insufficient documentation

## 2018-02-11 DIAGNOSIS — J3089 Other allergic rhinitis: Secondary | ICD-10-CM

## 2018-02-11 DIAGNOSIS — Z789 Other specified health status: Secondary | ICD-10-CM

## 2018-02-11 DIAGNOSIS — R609 Edema, unspecified: Secondary | ICD-10-CM

## 2018-02-11 DIAGNOSIS — K219 Gastro-esophageal reflux disease without esophagitis: Secondary | ICD-10-CM

## 2018-02-11 MED ORDER — PANTOPRAZOLE SODIUM 40 MG PO TBEC: DELAYED_RELEASE_TABLET | ORAL | 11 refills | Status: DC

## 2018-02-11 MED ORDER — FLUTICASONE PROPIONATE 50 MCG/ACT NA SUSP: 2.0000 | Freq: Every day | NASAL | 11 refills | Status: DC

## 2018-02-11 MED ORDER — LOTEMAX 0.5 % OP SUSP: 1.0000 [drp] | Freq: Every day | OPHTHALMIC | 11 refills | Status: DC

## 2018-02-11 MED ORDER — DICYCLOMINE HCL 10 MG PO CAPS: ORAL_CAPSULE | ORAL | 2 refills | Status: DC

## 2018-02-11 NOTE — Progress Notes (Signed)
Subjective:    Patient ID: Erika Day, female    DOB: May 02, 1930, 84 y.o.   MRN: [id number]  HPI  1.  Wheelchair concerns:  Measurement from wheel to wheel is 26 inches.  Any wider and does not fit through their doorway.  2.  Leg edema:  Still swelling too much.  Not getting legs elevated.  She is using Furosemide, but sometimes forgets.  Has to use pullups and Depends as she cannot move fast enough to bathroom.  Up much of night with TV to gospel channel.  States her sodium has been decreased in diet.  Using compression stockings when health aide comes at appropriate time.  [name].  3.  Left ear full of wax and can't get it out.  Having difficulties hearing.   4.  Knots under neck:  Makes it hard to breathe.  Her eyes and nose draining.  Lot of posterior pharyngeal drainage.  Is taking Zyrtec, which helps, but still with symptoms.  States this started with the pollen falling on cars.     5.  Acid in stomach--hard to bend over and put compression stockings on when this occurs.  Out of protonix.  Current Meds  Medication Sig  . cetirizine (ZYRTEC) 10 MG tablet 1 tab by mouth daily for allergy symptoms  . dicyclomine (BENTYL) 10 MG capsule 1 cap by mouth every 6 hours as needed for abdominal cramping.  Water engineer Bandages & Supports (MEDICAL COMPRESSION STOCKINGS) MISC 20-30 mm Hg with toe thigh high graduated compression stockings on every morning and off at night.  . furosemide (LASIX) 20 MG tablet Take 1 tablet (20 mg total) by mouth daily as needed for fluid.  Marland Kitchen lisinopril (PRINIVIL,ZESTRIL) 20 MG tablet Take 1 tablet (20 mg total) by mouth daily.  Marland Kitchen LOTEMAX 0.5 % ophthalmic suspension Place 1 drop into the left eye daily.   . methocarbamol (ROBAXIN) 500 MG tablet Take 1 tablet (500 mg total) by mouth every 6 (six) hours as needed for muscle spasms.  . pantoprazole (PROTONIX) 40 MG tablet Take 1 tablet (40 mg total) by mouth daily.      Review of Systems     Objective:   Physical Exam  Eyes watery and red nasal mucosa swollen and red, throat without injection clear nasal and eye discharge. Right anterior cervical node, 1 cm with tenderness. Lungs:  CTA CV:  RRR with normal S1 and S2, No S3, S4 or murmur.  Radial and DP pulses normal and equal   Abd:  S, NT, + BS, No HSM or mass.  Protuberant with her bent forward position pushes her belly out. LE:  Mild to moderate pitting edema through compression stockings.      Assessment & Plan:  1. Poor tolerance for ambulation:  Need for new wheelchair.  Donated wheelchair too wide and current one taped together in areas.   Attempted order for this, but unable to fill out appropriately in system to get it to go through as an order.   Call into Epic to get this filled out.  2.  Allergies:  Cetirizine 10 mg daily and nasal Fluticasone, 2 sprays each nostril daily.  3.  GERD:  Pantoprazole refilled. Refilled Dicyclomine for abdominal cramping as well.  4.  Leg edema:  Counseled to get to bed at a reasonable time and get her legs up in bed.  When sitting up during day to get legs elevated.  Will continue to ask [facility] members to check  in with her and see what she is eating regarding sodium as well.

## 2018-02-11 NOTE — Telephone Encounter (Signed)
Spoke with patient in office. States she does not need tramadol.

## 2018-02-14 ENCOUNTER — Other Ambulatory Visit: Payer: Medicare Other

## 2018-02-14 ENCOUNTER — Encounter: Payer: Self-pay | Admitting: Hematology and Oncology

## 2018-02-14 ENCOUNTER — Ambulatory Visit: Payer: Medicare Other | Admitting: Hematology and Oncology

## 2018-02-22 ENCOUNTER — Telehealth: Payer: Self-pay | Admitting: Hematology and Oncology

## 2018-02-22 NOTE — Telephone Encounter (Signed)
Spoke to patients aid regarding voicemail she left earlier. Patient requested an appointment with Dr Alvy Bimler because she missed her last appointment.

## 2018-03-11 ENCOUNTER — Telehealth: Payer: Self-pay | Admitting: Hematology and Oncology

## 2018-03-11 ENCOUNTER — Encounter: Payer: Self-pay | Admitting: Hematology and Oncology

## 2018-03-11 ENCOUNTER — Inpatient Hospital Stay (HOSPITAL_BASED_OUTPATIENT_CLINIC_OR_DEPARTMENT_OTHER): Payer: Medicare Other | Admitting: Hematology and Oncology

## 2018-03-11 ENCOUNTER — Inpatient Hospital Stay: Payer: Medicare Other | Attending: Hematology and Oncology

## 2018-03-11 DIAGNOSIS — D61818 Other pancytopenia: Secondary | ICD-10-CM

## 2018-03-11 DIAGNOSIS — E46 Unspecified protein-calorie malnutrition: Secondary | ICD-10-CM

## 2018-03-11 DIAGNOSIS — D472 Monoclonal gammopathy: Secondary | ICD-10-CM | POA: Insufficient documentation

## 2018-03-11 DIAGNOSIS — C8584 Other specified types of non-Hodgkin lymphoma, lymph nodes of axilla and upper limb: Secondary | ICD-10-CM | POA: Diagnosis not present

## 2018-03-11 DIAGNOSIS — Z885 Allergy status to narcotic agent status: Secondary | ICD-10-CM

## 2018-03-11 DIAGNOSIS — Z79899 Other long term (current) drug therapy: Secondary | ICD-10-CM

## 2018-03-11 DIAGNOSIS — R54 Age-related physical debility: Secondary | ICD-10-CM | POA: Diagnosis not present

## 2018-03-11 LAB — COMPREHENSIVE METABOLIC PANEL
ALBUMIN: 4.2 g/dL (ref 3.5–5.0)
ALT: 10 U/L (ref 0–55)
AST: 15 U/L (ref 5–34)
Alkaline Phosphatase: 67 U/L (ref 40–150)
Anion gap: 6 (ref 3–11)
BUN: 21 mg/dL (ref 7–26)
CHLORIDE: 109 mmol/L (ref 98–109)
CO2: 25 mmol/L (ref 22–29)
CREATININE: 1.09 mg/dL (ref 0.60–1.10)
Calcium: 9.8 mg/dL (ref 8.4–10.4)
GFR calc Af Amer: 52 mL/min — ABNORMAL LOW (ref >60)
GFR calc non Af Amer: 45 mL/min — ABNORMAL LOW (ref >60)
Glucose, Bld: 94 mg/dL (ref 70–140)
POTASSIUM: 4.1 mmol/L (ref 3.5–5.1)
SODIUM: 140 mmol/L (ref 136–145)
Total Bilirubin: 0.3 mg/dL (ref 0.2–1.2)
Total Protein: 7.6 g/dL (ref 6.4–8.3)

## 2018-03-11 LAB — CBC WITH DIFFERENTIAL/PLATELET
BASOS PCT: 1 %
Basophils Absolute: 0 10*3/uL (ref 0.0–0.1)
EOS ABS: 0.1 10*3/uL (ref 0.0–0.5)
EOS PCT: 3 %
HEMATOCRIT: 31.8 % — AB (ref 34.8–46.6)
Hemoglobin: 10.4 g/dL — ABNORMAL LOW (ref 11.6–15.9)
Lymphocytes Relative: 36 %
Lymphs Abs: 0.8 10*3/uL — ABNORMAL LOW (ref 0.9–3.3)
MCH: 28.9 pg (ref 25.1–34.0)
MCHC: 32.7 g/dL (ref 31.5–36.0)
MCV: 88.3 fL (ref 79.5–101.0)
MONO ABS: 0.2 10*3/uL (ref 0.1–0.9)
MONOS PCT: 8 %
NEUTROS PCT: 52 %
Neutro Abs: 1.2 10*3/uL — ABNORMAL LOW (ref 1.5–6.5)
PLATELETS: 145 10*3/uL (ref 145–400)
RBC: 3.61 MIL/uL — ABNORMAL LOW (ref 3.70–5.45)
RDW: 14.1 % (ref 11.2–14.5)
WBC: 2.3 10*3/uL — AB (ref 3.9–10.3)

## 2018-03-11 NOTE — Assessment & Plan Note (Signed)
She has chronic pancytopenia that is stable The patient is somewhat malnourished She is not symptomatic from pancytopenia I recommend close observation only Leukopenia certainly could be constitutional and her chronic anemia could be related to chronic kidney disease

## 2018-03-11 NOTE — Assessment & Plan Note (Signed)
She has history of MGUS Myeloma level is pending I plan to follow closely and see her back in 6 months Clinically, she is not symptomatic with no signs or symptoms of bone pain

## 2018-03-11 NOTE — Progress Notes (Signed)
[place] OFFICE PROGRESS NOTE  Patient Care Team: Mack Hook, MD as PCP - General (Internal Medicine) Nolene Ebbs, MD (Internal Medicine) [name], MD as Consulting Physician (Gastroenterology) Michael Boston, MD as Consulting Physician (General Surgery) Heath Lark, MD as Consulting Physician (Hematology and Oncology)  ASSESSMENT & PLAN:  Marginal zone lymphoma of axilla [facility]) Examination did not reveal any signs of cancer recurrence I plan to see her back in 6 months  Other pancytopenia [facility]) She has chronic pancytopenia that is stable The patient is somewhat malnourished She is not symptomatic from pancytopenia I recommend close observation only Leukopenia certainly could be constitutional and her chronic anemia could be related to chronic kidney disease  MGUS (monoclonal gammopathy of unknown significance) She has history of MGUS Myeloma level is pending I plan to follow closely and see her back in 6 months Clinically, she is not symptomatic with no signs or symptoms of bone pain   No orders of the defined types were placed in this encounter.   INTERVAL HISTORY: Please see below for problem oriented charting. She returns for further follow-up She denies recent infection She is frail Her intake is poor She denies recent weight loss The patient denies any recent signs or symptoms of bleeding such as spontaneous epistaxis, hematuria or hematochezia. She denies new lymphadenopathy  SUMMARY OF ONCOLOGIC HISTORY:   Marginal zone lymphoma of axilla ([place])   05/18/2015 Imaging    Ct abdomen and pelvis showed reactive lymphadenopathy and stool filled colon      02/13/2016 Imaging    Ct chest showed pneumonia      05/08/2016 Imaging    screening mammogram revealed right axillary lymphadenopathy      05/12/2016 Procedure    She underwent core needle biopsy of axillary LN      05/12/2016 Pathology Results    [phone] Biopsy showed  low grade non-Hodgkin B cell lymphoma, likely marginal zone lymphoma      05/15/2016 Pathology Results    Peripheral blood positive for deletion 13q      05/25/2016 PET scan    Mildly hypermetabolic mediastinal, left axillary and right inguinal lymph nodes. 2. Mild hypermetabolism in the left thyroid, nonspecific      06/27/2016 Imaging    Ct abdomen showed sigmoid volvulus resulting in large bowel obstruction. Splenomegaly, in keeping with patient's history of lymphoma.       06/30/2016 Pathology Results    Accession: [id number] pathology specimen showed low grade lymphoma      06/30/2016 Surgery    She had LAPAROSCOPIC SIGMOID COLECTOMY,  Rigid sigmoidoscopy, with Primary repair recurrent incisional hernia         11/13/2016 PET scan    No evidence disease progression. 2. Mild decrease in metabolic activity in axillary lymph nodes. 3. Similar metabolic activity of small mediastinal lymph nodes [place] 3 . 4. Similar activity in inguinal lymph nodes. 5. No abnormal bowel activity identified       REVIEW OF SYSTEMS:   Constitutional: Denies fevers, chills  Eyes: Denies blurriness of vision Ears, nose, mouth, throat, and face: Denies mucositis or sore throat Respiratory: Denies cough, dyspnea or wheezes Cardiovascular: Denies palpitation, chest discomfort or lower extremity swelling Gastrointestinal:  Denies nausea, heartburn or change in bowel habits Skin: Denies abnormal skin rashes Lymphatics: Denies new lymphadenopathy or easy bruising Neurological:Denies numbness, tingling or new weaknesses Behavioral/Psych: Mood is stable, no new changes  All other systems were reviewed with the patient and are negative.  I  have reviewed the past medical history, past surgical history, social history and family history with the patient and they are unchanged from previous note.  ALLERGIES:  is allergic to morphine and related.  MEDICATIONS:  Current Outpatient Medications   Medication Sig Dispense Refill  . cetirizine (ZYRTEC) 10 MG tablet 1 tab by mouth daily for allergy symptoms 30 tablet 11  . dicyclomine (BENTYL) 10 MG capsule 1 cap by mouth every 6 hours as needed for abdominal cramping. 30 capsule 2  . Elastic Bandages & Supports (MEDICAL COMPRESSION STOCKINGS) MISC 20-30 mm Hg with toe thigh high graduated compression stockings on every morning and off at night. 3 each 1  . fluticasone (FLONASE) 50 MCG/ACT nasal spray Place 2 sprays into both nostrils daily. 16 g 11  . furosemide (LASIX) 20 MG tablet Take 1 tablet (20 mg total) by mouth daily as needed for fluid. 30 tablet 11  . lisinopril (PRINIVIL,ZESTRIL) 20 MG tablet Take 1 tablet (20 mg total) by mouth daily. 30 tablet 11  . LOTEMAX 0.5 % ophthalmic suspension Place 1 drop into the left eye daily. 5 mL 11  . methocarbamol (ROBAXIN) 500 MG tablet Take 1 tablet (500 mg total) by mouth every 6 (six) hours as needed for muscle spasms. 80 tablet 0  . pantoprazole (PROTONIX) 40 MG tablet 1 tab by mouth daily 1/2 hour before breakfast on an empty stomach 30 tablet 11   No current facility-administered medications for this visit.     PHYSICAL EXAMINATION: ECOG PERFORMANCE STATUS: 2 - Symptomatic, <50% confined to bed GENERAL:alert, no distress and comfortable.  She appears thin and frail, examined sitting on the wheelchair SKIN: skin color, texture, turgor are normal, no rashes or significant lesions EYES: normal, Conjunctiva are pink and non-injected, sclera clear OROPHARYNX:no exudate, no erythema and lips, buccal mucosa, and tongue normal  NECK: supple, thyroid normal size, non-tender, without nodularity LYMPH:  no palpable lymphadenopathy in the cervical, axillary or inguinal LUNGS: clear to auscultation and percussion with normal breathing effort HEART: regular rate & rhythm and no murmurs and no lower extremity edema ABDOMEN:abdomen soft, non-tender and normal bowel sounds Musculoskeletal:no  cyanosis of digits and no clubbing  NEURO: alert & oriented x 3 with fluent speech, no focal motor/sensory deficits  LABORATORY DATA:  I have reviewed the data as listed    Component Value Date/Time   NA 135 12/07/2017 1427   NA 141 02/19/2017 1111   K 5.2 12/07/2017 1427   K 4.3 02/19/2017 1111   CL 98 12/07/2017 1427   CO2 22 12/07/2017 1427   CO2 26 02/19/2017 1111   GLUCOSE 102 (H) 12/07/2017 1427   GLUCOSE 102 (H) 11/02/2017 1032   GLUCOSE 72 02/19/2017 1111   BUN 20 12/07/2017 1427   BUN 16.7 02/19/2017 1111   CREATININE 1.11 (H) 12/07/2017 1427   CREATININE 0.9 02/19/2017 1111   CALCIUM 10.0 12/07/2017 1427   CALCIUM 10.2 02/19/2017 1111   PROT 7.4 11/02/2017 1032   PROT 7.3 08/23/2017 1542   PROT 7.5 02/19/2017 1111   ALBUMIN 3.9 11/02/2017 1032   ALBUMIN 4.1 08/23/2017 1542   ALBUMIN 3.8 02/19/2017 1111   AST 19 11/02/2017 1032   AST 14 02/19/2017 1111   ALT 11 (L) 11/02/2017 1032   ALT 8 02/19/2017 1111   ALKPHOS 61 11/02/2017 1032   ALKPHOS 92 02/19/2017 1111   BILITOT 0.7 11/02/2017 1032   BILITOT 0.3 08/23/2017 1542   BILITOT 0.36 02/19/2017 1111   GFRNONAA 45 (L)  12/07/2017 1427   GFRAA 52 (L) 12/07/2017 1427    No results found for: SPEP, UPEP  Lab Results  Component Value Date   WBC 2.3 (L) 03/11/2018   NEUTROABS 1.2 (L) 03/11/2018   HGB 10.4 (L) 03/11/2018   HCT 31.8 (L) 03/11/2018   MCV 88.3 03/11/2018   PLT 145 03/11/2018      Chemistry      Component Value Date/Time   NA 135 12/07/2017 1427   NA 141 02/19/2017 1111   K 5.2 12/07/2017 1427   K 4.3 02/19/2017 1111   CL 98 12/07/2017 1427   CO2 22 12/07/2017 1427   CO2 26 02/19/2017 1111   BUN 20 12/07/2017 1427   BUN 16.7 02/19/2017 1111   CREATININE 1.11 (H) 12/07/2017 1427   CREATININE 0.9 02/19/2017 1111      Component Value Date/Time   CALCIUM 10.0 12/07/2017 1427   CALCIUM 10.2 02/19/2017 1111   ALKPHOS 61 11/02/2017 1032   ALKPHOS 92 02/19/2017 1111   AST 19 11/02/2017  1032   AST 14 02/19/2017 1111   ALT 11 (L) 11/02/2017 1032   ALT 8 02/19/2017 1111   BILITOT 0.7 11/02/2017 1032   BILITOT 0.3 08/23/2017 1542   BILITOT 0.36 02/19/2017 1111       All questions were answered. The patient knows to call the clinic with any problems, questions or concerns. No barriers to learning was detected.  I spent 15 minutes counseling the patient face to face. The total time spent in the appointment was 20 minutes and more than 50% was on counseling and review of test results  Heath Lark, MD 03/11/2018 1:25 PM

## 2018-03-11 NOTE — Assessment & Plan Note (Signed)
Examination did not reveal any signs of cancer recurrence I plan to see her back in 6 months

## 2018-03-11 NOTE — Telephone Encounter (Signed)
Gave patient AVs and calendar of upcoming November appointments.  °

## 2018-03-14 LAB — KAPPA/LAMBDA LIGHT CHAINS
KAPPA FREE LGHT CHN: 108.8 mg/L — AB (ref 3.3–19.4)
KAPPA, LAMDA LIGHT CHAIN RATIO: 1.74 — AB (ref 0.26–1.65)
LAMDA FREE LIGHT CHAINS: 62.6 mg/L — AB (ref 5.7–26.3)

## 2018-03-15 ENCOUNTER — Ambulatory Visit (INDEPENDENT_AMBULATORY_CARE_PROVIDER_SITE_OTHER): Payer: Medicare Other | Admitting: Internal Medicine

## 2018-03-15 ENCOUNTER — Encounter: Payer: Self-pay | Admitting: Internal Medicine

## 2018-03-15 VITALS — BP 136/72 | HR 78 | Resp 12

## 2018-03-15 DIAGNOSIS — I10 Essential (primary) hypertension: Secondary | ICD-10-CM

## 2018-03-15 DIAGNOSIS — J3089 Other allergic rhinitis: Secondary | ICD-10-CM

## 2018-03-15 DIAGNOSIS — R600 Localized edema: Secondary | ICD-10-CM

## 2018-03-15 DIAGNOSIS — K219 Gastro-esophageal reflux disease without esophagitis: Secondary | ICD-10-CM

## 2018-03-15 MED ORDER — FEXOFENADINE HCL 180 MG PO TABS
180.0000 mg | ORAL_TABLET | Freq: Every day | ORAL | 11 refills | Status: DC
Start: 2018-03-15 — End: 2018-04-22

## 2018-03-15 NOTE — Progress Notes (Signed)
   Subjective:    Patient ID: Erika Day, female    DOB: 11-Sep-1930, 84 y.o.   MRN: [id number]  HPI 1.  Out of medicine:  Accidentally got bottles thrown out with recycling.  Out of medicine for 4 days. Did have 2 more days of Lisinopril.  They have not tried to refill meds as thought they would not be allowed.  2.  Allergies:  Flonase not working for her.  States another nasal spray used to help her bring mucous up.  Discussed the nasal spray should help keep her from forming a lot of mucous.  She is using Zyrtec as well.  All symptoms worse since stopped meds 4 days ago.  Sneezing a lot.    3.  GERD: Was only taking Pantoprazole when she had symptoms.  Maybe once weekly.  She has trouble falling asleep due to heartburn symptoms on days when she eats something like ham.  She would be willing to take daily on an empty stomach.  4.  LE edema:  Is going to bed more regularly at 10 p.m. Sleeping in her bed and keeping legs elevated at night.  Sleeping through night most of time--probably about 9 hours.  Feeling better.  Current Meds  Medication Sig  . cetirizine (ZYRTEC) 10 MG tablet 1 tab by mouth daily for allergy symptoms  . dicyclomine (BENTYL) 10 MG capsule 1 cap by mouth every 6 hours as needed for abdominal cramping.  Water engineer Bandages & Supports (MEDICAL COMPRESSION STOCKINGS) MISC 20-30 mm Hg with toe thigh high graduated compression stockings on every morning and off at night.  . fluticasone (FLONASE) 50 MCG/ACT nasal spray Place 2 sprays into both nostrils daily.  . furosemide (LASIX) 20 MG tablet Take 1 tablet (20 mg total) by mouth daily as needed for fluid.  Marland Kitchen lisinopril (PRINIVIL,ZESTRIL) 20 MG tablet Take 1 tablet (20 mg total) by mouth daily.  Marland Kitchen LOTEMAX 0.5 % ophthalmic suspension Place 1 drop into the left eye daily.  . methocarbamol (ROBAXIN) 500 MG tablet Take 1 tablet (500 mg total) by mouth every 6 (six) hours as needed for muscle spasms.  . pantoprazole (PROTONIX) 40 MG  tablet 1 tab by mouth daily 1/2 hour before breakfast on an empty stomach   Allergies  Allergen Reactions  . Morphine And Related Other (See Comments)    Blood sugar dropped one-time   Review of Systems     Objective:   Physical Exam  NAD HEENT:  PERRL, EOMI, conjunctivae without injection.  TMs pearly gray.  Throat without injection.  Nasal mucosa boggy with clear discharge. Neck:  Supple, No adenopathy. Chest:  CTA CV:  RRR without murmur or rub.  Radial and DP pulses normal and equal. LE:  Mild edema      Assessment & Plan:  1.  Hypertension:  Refill Lisinopril.  2.  Allergies:  To try Allegra 180 mg daily and get restarted on nasal fluticasone  3.  LE edema:  Much improved with lying down for sleep and elevation of legs.  4.  GERD:  To start Pantoprazole at bedtime nightly on empty stomach.  Follow up in 1 month with fasting labs:  A1C, FLP.  Otherwise follow up in 4 months.

## 2018-03-16 LAB — MULTIPLE MYELOMA PANEL, SERUM
ALBUMIN/GLOB SERPL: 1.1 (ref 0.7–1.7)
ALPHA2 GLOB SERPL ELPH-MCNC: 0.5 g/dL (ref 0.4–1.0)
Albumin SerPl Elph-Mcnc: 3.5 g/dL (ref 2.9–4.4)
Alpha 1: 0.3 g/dL (ref 0.0–0.4)
B-Globulin SerPl Elph-Mcnc: 1.1 g/dL (ref 0.7–1.3)
Gamma Glob SerPl Elph-Mcnc: 1.3 g/dL (ref 0.4–1.8)
Globulin, Total: 3.3 g/dL (ref 2.2–3.9)
IGG (IMMUNOGLOBIN G), SERUM: 1393 mg/dL (ref 700–1600)
IGM (IMMUNOGLOBULIN M), SRM: 126 mg/dL (ref 26–217)
IgA: 492 mg/dL — ABNORMAL HIGH (ref 64–422)
M Protein SerPl Elph-Mcnc: 0.7 g/dL — ABNORMAL HIGH
Total Protein ELP: 6.8 g/dL (ref 6.0–8.5)

## 2018-04-07 ENCOUNTER — Telehealth: Payer: Self-pay

## 2018-04-07 MED ORDER — ONDANSETRON HCL 4 MG PO TABS: ORAL_TABLET | ORAL | 0 refills | Status: DC

## 2018-04-07 NOTE — Telephone Encounter (Signed)
Patient called stating she has had loose stool x 3 days and this morning at 3 am she started with vomiting. States she has vomited 3 times this morning. Patient having cramping and nausea. Has fever of 100 but she has taken fever medication. Patient has been sipping on water and Gatorade. States she can keep fluids down for about 45 minutes then it comes back up. Patient denies in blood in stool. Patient states she has only had 1 loose stool today so far.  Per Dr. Amil Amen have patient start Zofran and wait 30 minutes then try sipping on fluids to see if that helps. Have patient to call back in the morning with how she is feeling and if symptoms gets worse call us back immediately. Patient has been informed and verbalized understanding. Patient Rx sent to [facility].

## 2018-04-08 ENCOUNTER — Inpatient Hospital Stay (HOSPITAL_COMMUNITY)
Admission: EM | Admit: 2018-04-08 | Discharge: 2018-04-22 | DRG: 335 | Disposition: A | Discharge status: Skilled Nursing Facility | Payer: Medicare Other | Attending: Internal Medicine | Admitting: Internal Medicine

## 2018-04-08 ENCOUNTER — Inpatient Hospital Stay (HOSPITAL_COMMUNITY): Payer: Medicare Other

## 2018-04-08 ENCOUNTER — Encounter (HOSPITAL_COMMUNITY): Payer: Self-pay

## 2018-04-08 ENCOUNTER — Emergency Department (HOSPITAL_COMMUNITY): Payer: Medicare Other

## 2018-04-08 ENCOUNTER — Other Ambulatory Visit: Payer: Self-pay

## 2018-04-08 DIAGNOSIS — E878 Other disorders of electrolyte and fluid balance, not elsewhere classified: Secondary | ICD-10-CM | POA: Diagnosis not present

## 2018-04-08 DIAGNOSIS — M150 Primary generalized (osteo)arthritis: Secondary | ICD-10-CM | POA: Diagnosis not present

## 2018-04-08 DIAGNOSIS — K56609 Unspecified intestinal obstruction, unspecified as to partial versus complete obstruction: Secondary | ICD-10-CM | POA: Diagnosis not present

## 2018-04-08 DIAGNOSIS — R188 Other ascites: Secondary | ICD-10-CM | POA: Diagnosis present

## 2018-04-08 DIAGNOSIS — K219 Gastro-esophageal reflux disease without esophagitis: Secondary | ICD-10-CM | POA: Diagnosis present

## 2018-04-08 DIAGNOSIS — D61818 Other pancytopenia: Secondary | ICD-10-CM | POA: Diagnosis present

## 2018-04-08 DIAGNOSIS — K562 Volvulus: Secondary | ICD-10-CM | POA: Diagnosis not present

## 2018-04-08 DIAGNOSIS — N179 Acute kidney failure, unspecified: Secondary | ICD-10-CM | POA: Diagnosis present

## 2018-04-08 DIAGNOSIS — R1010 Upper abdominal pain, unspecified: Secondary | ICD-10-CM | POA: Diagnosis present

## 2018-04-08 DIAGNOSIS — I509 Heart failure, unspecified: Secondary | ICD-10-CM

## 2018-04-08 DIAGNOSIS — Z993 Dependence on wheelchair: Secondary | ICD-10-CM

## 2018-04-08 DIAGNOSIS — R112 Nausea with vomiting, unspecified: Secondary | ICD-10-CM

## 2018-04-08 DIAGNOSIS — R402424 Glasgow coma scale score 9-12, 24 hours or more after hospital admission: Secondary | ICD-10-CM | POA: Diagnosis not present

## 2018-04-08 DIAGNOSIS — E1122 Type 2 diabetes mellitus with diabetic chronic kidney disease: Secondary | ICD-10-CM | POA: Diagnosis present

## 2018-04-08 DIAGNOSIS — N183 Chronic kidney disease, stage 3 (moderate): Secondary | ICD-10-CM | POA: Diagnosis present

## 2018-04-08 DIAGNOSIS — D631 Anemia in chronic kidney disease: Secondary | ICD-10-CM | POA: Diagnosis present

## 2018-04-08 DIAGNOSIS — E11649 Type 2 diabetes mellitus with hypoglycemia without coma: Secondary | ICD-10-CM | POA: Diagnosis not present

## 2018-04-08 DIAGNOSIS — E872 Acidosis: Secondary | ICD-10-CM | POA: Diagnosis not present

## 2018-04-08 DIAGNOSIS — Z90710 Acquired absence of both cervix and uterus: Secondary | ICD-10-CM

## 2018-04-08 DIAGNOSIS — D62 Acute posthemorrhagic anemia: Secondary | ICD-10-CM | POA: Diagnosis not present

## 2018-04-08 DIAGNOSIS — J181 Lobar pneumonia, unspecified organism: Secondary | ICD-10-CM | POA: Diagnosis not present

## 2018-04-08 DIAGNOSIS — D649 Anemia, unspecified: Secondary | ICD-10-CM | POA: Diagnosis not present

## 2018-04-08 DIAGNOSIS — D696 Thrombocytopenia, unspecified: Secondary | ICD-10-CM | POA: Diagnosis present

## 2018-04-08 DIAGNOSIS — S3210XA Unspecified fracture of sacrum, initial encounter for closed fracture: Secondary | ICD-10-CM | POA: Diagnosis present

## 2018-04-08 DIAGNOSIS — C8304 Small cell B-cell lymphoma, lymph nodes of axilla and upper limb: Secondary | ICD-10-CM | POA: Diagnosis present

## 2018-04-08 DIAGNOSIS — T40605A Adverse effect of unspecified narcotics, initial encounter: Secondary | ICD-10-CM | POA: Diagnosis not present

## 2018-04-08 DIAGNOSIS — J189 Pneumonia, unspecified organism: Secondary | ICD-10-CM | POA: Diagnosis not present

## 2018-04-08 DIAGNOSIS — K565 Intestinal adhesions [bands], unspecified as to partial versus complete obstruction: Principal | ICD-10-CM | POA: Diagnosis present

## 2018-04-08 DIAGNOSIS — R140 Abdominal distension (gaseous): Secondary | ICD-10-CM

## 2018-04-08 DIAGNOSIS — M1990 Unspecified osteoarthritis, unspecified site: Secondary | ICD-10-CM | POA: Diagnosis present

## 2018-04-08 DIAGNOSIS — Z8673 Personal history of transient ischemic attack (TIA), and cerebral infarction without residual deficits: Secondary | ICD-10-CM

## 2018-04-08 DIAGNOSIS — X58XXXA Exposure to other specified factors, initial encounter: Secondary | ICD-10-CM | POA: Diagnosis present

## 2018-04-08 DIAGNOSIS — Z452 Encounter for adjustment and management of vascular access device: Secondary | ICD-10-CM

## 2018-04-08 DIAGNOSIS — M5136 Other intervertebral disc degeneration, lumbar region: Secondary | ICD-10-CM | POA: Diagnosis present

## 2018-04-08 DIAGNOSIS — I952 Hypotension due to drugs: Secondary | ICD-10-CM | POA: Diagnosis not present

## 2018-04-08 DIAGNOSIS — I129 Hypertensive chronic kidney disease with stage 1 through stage 4 chronic kidney disease, or unspecified chronic kidney disease: Secondary | ICD-10-CM | POA: Diagnosis present

## 2018-04-08 DIAGNOSIS — Z0189 Encounter for other specified special examinations: Secondary | ICD-10-CM

## 2018-04-08 DIAGNOSIS — Z87891 Personal history of nicotine dependence: Secondary | ICD-10-CM

## 2018-04-08 DIAGNOSIS — K567 Ileus, unspecified: Secondary | ICD-10-CM | POA: Diagnosis not present

## 2018-04-08 DIAGNOSIS — K5900 Constipation, unspecified: Secondary | ICD-10-CM | POA: Diagnosis present

## 2018-04-08 DIAGNOSIS — Z8719 Personal history of other diseases of the digestive system: Secondary | ICD-10-CM

## 2018-04-08 DIAGNOSIS — Z79899 Other long term (current) drug therapy: Secondary | ICD-10-CM

## 2018-04-08 DIAGNOSIS — D472 Monoclonal gammopathy: Secondary | ICD-10-CM | POA: Diagnosis present

## 2018-04-08 DIAGNOSIS — I10 Essential (primary) hypertension: Secondary | ICD-10-CM | POA: Diagnosis not present

## 2018-04-08 DIAGNOSIS — R109 Unspecified abdominal pain: Secondary | ICD-10-CM

## 2018-04-08 DIAGNOSIS — R0600 Dyspnea, unspecified: Secondary | ICD-10-CM

## 2018-04-08 DIAGNOSIS — R262 Difficulty in walking, not elsewhere classified: Secondary | ICD-10-CM | POA: Diagnosis present

## 2018-04-08 DIAGNOSIS — R000 Tachycardia, unspecified: Secondary | ICD-10-CM | POA: Diagnosis not present

## 2018-04-08 DIAGNOSIS — D72819 Decreased white blood cell count, unspecified: Secondary | ICD-10-CM | POA: Diagnosis not present

## 2018-04-08 DIAGNOSIS — Z9049 Acquired absence of other specified parts of digestive tract: Secondary | ICD-10-CM

## 2018-04-08 DIAGNOSIS — Z885 Allergy status to narcotic agent status: Secondary | ICD-10-CM

## 2018-04-08 LAB — CBC WITH DIFFERENTIAL/PLATELET
Basophils Absolute: 0 10*3/uL (ref 0.0–0.1)
Basophils Relative: 0 %
Eosinophils Absolute: 0 10*3/uL (ref 0.0–0.7)
Eosinophils Relative: 1 %
HCT: 36.4 % (ref 36.0–46.0)
Hemoglobin: 12.2 g/dL (ref 12.0–15.0)
Lymphocytes Relative: 19 %
Lymphs Abs: 0.8 10*3/uL (ref 0.7–4.0)
MCH: 29.5 pg (ref 26.0–34.0)
MCHC: 33.5 g/dL (ref 30.0–36.0)
MCV: 87.9 fL (ref 78.0–100.0)
Monocytes Absolute: 0.4 10*3/uL (ref 0.1–1.0)
Monocytes Relative: 10 %
Neutro Abs: 3 10*3/uL (ref 1.7–7.7)
Neutrophils Relative %: 70 %
Platelets: 161 10*3/uL (ref 150–400)
RBC: 4.14 MIL/uL (ref 3.87–5.11)
RDW: 12.6 % (ref 11.5–15.5)
WBC: 4.2 10*3/uL (ref 4.0–10.5)

## 2018-04-08 LAB — URINALYSIS, ROUTINE W REFLEX MICROSCOPIC
Bilirubin Urine: NEGATIVE
Glucose, UA: NEGATIVE mg/dL
Hgb urine dipstick: NEGATIVE
Ketones, ur: NEGATIVE mg/dL
Leukocytes, UA: NEGATIVE
Nitrite: NEGATIVE
Protein, ur: NEGATIVE mg/dL
Specific Gravity, Urine: 1.027 (ref 1.005–1.030)
pH: 5 (ref 5.0–8.0)

## 2018-04-08 LAB — COMPREHENSIVE METABOLIC PANEL
ALT: 11 U/L — ABNORMAL LOW (ref 14–54)
AST: 20 U/L (ref 15–41)
Albumin: 4.2 g/dL (ref 3.5–5.0)
Alkaline Phosphatase: 62 U/L (ref 38–126)
Anion gap: 9 (ref 5–15)
BUN: 20 mg/dL (ref 6–20)
CO2: 26 mmol/L (ref 22–32)
Calcium: 9.8 mg/dL (ref 8.9–10.3)
Chloride: 97 mmol/L — ABNORMAL LOW (ref 101–111)
Creatinine, Ser: 1.23 mg/dL — ABNORMAL HIGH (ref 0.44–1.00)
GFR calc Af Amer: 45 mL/min — ABNORMAL LOW (ref >60)
GFR calc non Af Amer: 39 mL/min — ABNORMAL LOW (ref >60)
Glucose, Bld: 119 mg/dL — ABNORMAL HIGH (ref 65–99)
Potassium: 4.3 mmol/L (ref 3.5–5.1)
Sodium: 132 mmol/L — ABNORMAL LOW (ref 135–145)
Total Bilirubin: 0.7 mg/dL (ref 0.3–1.2)
Total Protein: 7.4 g/dL (ref 6.5–8.1)

## 2018-04-08 LAB — MAGNESIUM: Magnesium: 2 mg/dL (ref 1.7–2.4)

## 2018-04-08 LAB — LIPASE, BLOOD: Lipase: 47 U/L (ref 11–51)

## 2018-04-08 MED ORDER — FAMOTIDINE IN NACL 20-0.9 MG/50ML-% IV SOLN
20.0000 mg | Freq: Two times a day (BID) | INTRAVENOUS | Status: DC
  Administered 2018-04-08 – 2018-04-15 (×15): 20 mg via INTRAVENOUS
  Filled 2018-04-08 (×15): qty 50

## 2018-04-08 MED ORDER — POTASSIUM CHLORIDE IN NACL 20-0.9 MEQ/L-% IV SOLN
INTRAVENOUS | Status: DC
  Filled 2018-04-08: qty 1000

## 2018-04-08 MED ORDER — ACETAMINOPHEN 650 MG RE SUPP
650.0000 mg | Freq: Four times a day (QID) | RECTAL | Status: DC | PRN
  Administered 2018-04-09: 650 mg via RECTAL
  Filled 2018-04-08: qty 1

## 2018-04-08 MED ORDER — ONDANSETRON HCL 4 MG/2ML IJ SOLN
4.0000 mg | Freq: Four times a day (QID) | INTRAMUSCULAR | Status: DC | PRN
  Administered 2018-04-09 – 2018-04-15 (×8): 4 mg via INTRAVENOUS
  Filled 2018-04-08 (×9): qty 2

## 2018-04-08 MED ORDER — ACETAMINOPHEN 325 MG PO TABS: 650.0000 mg | ORAL_TABLET | Freq: Four times a day (QID) | ORAL | Status: DC | PRN

## 2018-04-08 MED ORDER — DIATRIZOATE MEGLUMINE & SODIUM 66-10 % PO SOLN
90.0000 mL | Freq: Once | ORAL | Status: AC
  Administered 2018-04-08: 90 mL via NASOGASTRIC
  Filled 2018-04-08: qty 90

## 2018-04-08 MED ORDER — ENOXAPARIN SODIUM 40 MG/0.4ML ~~LOC~~ SOLN
40.0000 mg | SUBCUTANEOUS | Status: DC
  Administered 2018-04-08 – 2018-04-15 (×8): 40 mg via SUBCUTANEOUS
  Filled 2018-04-08 (×9): qty 0.4

## 2018-04-08 MED ORDER — VANCOMYCIN HCL 10 G IV SOLR
1250.0000 mg | Freq: Once | INTRAVENOUS | Status: DC
  Filled 2018-04-08: qty 1250

## 2018-04-08 MED ORDER — IOHEXOL 300 MG/ML  SOLN
75.0000 mL | Freq: Once | INTRAMUSCULAR | Status: AC | PRN
  Administered 2018-04-08: 75 mL via INTRAVENOUS

## 2018-04-08 MED ORDER — SODIUM CHLORIDE 0.9 % IV SOLN
INTRAVENOUS | Status: DC
  Administered 2018-04-08 – 2018-04-10 (×4): via INTRAVENOUS
  Administered 2018-04-11: 75 mL/h via INTRAVENOUS

## 2018-04-08 MED ORDER — SODIUM CHLORIDE 0.9 % IV SOLN: 1.0000 g | Freq: Once | INTRAVENOUS | Status: DC

## 2018-04-08 MED ORDER — FLUTICASONE PROPIONATE 50 MCG/ACT NA SUSP
2.0000 | Freq: Every day | NASAL | Status: DC
  Administered 2018-04-09 – 2018-04-15 (×7): 2 via NASAL
  Filled 2018-04-08: qty 16

## 2018-04-08 MED ORDER — LIDOCAINE-EPINEPHRINE (PF) 2 %-1:200000 IJ SOLN: 20.0000 mL | Freq: Once | INTRAMUSCULAR | Status: DC

## 2018-04-08 MED ORDER — SODIUM CHLORIDE 0.9 % IV SOLN
1.0000 g | Freq: Once | INTRAVENOUS | Status: DC
  Filled 2018-04-08: qty 1000

## 2018-04-08 MED ORDER — LEVALBUTEROL HCL 0.63 MG/3ML IN NEBU: 0.6300 mg | INHALATION_SOLUTION | Freq: Four times a day (QID) | RESPIRATORY_TRACT | Status: DC | PRN

## 2018-04-08 MED ORDER — ONDANSETRON HCL 4 MG PO TABS: 4.0000 mg | ORAL_TABLET | Freq: Four times a day (QID) | ORAL | Status: DC | PRN

## 2018-04-08 MED ORDER — HYDROMORPHONE HCL 1 MG/ML IJ SOLN
0.5000 mg | Freq: Once | INTRAMUSCULAR | Status: AC
  Administered 2018-04-08: 0.5 mg via INTRAVENOUS
  Filled 2018-04-08: qty 1

## 2018-04-08 MED ORDER — LOTEPREDNOL ETABONATE 0.5 % OP SUSP
1.0000 [drp] | Freq: Every day | OPHTHALMIC | Status: DC
  Administered 2018-04-08 – 2018-04-15 (×8): 1 [drp] via OPHTHALMIC
  Filled 2018-04-08: qty 5

## 2018-04-08 NOTE — ED Triage Notes (Signed)
Patient BIB EMS from home with complaints of RUQ abdominal pain. Patient reports the pain began at 0300 this morning after an episode of vomiting and diarrhea. Patient reports she "feels constipated."

## 2018-04-08 NOTE — ED Provider Notes (Signed)
[facility] DEPT Provider Note   CSN: [id number] Arrival date & time: 04/08/18  1232     History   Chief Complaint Chief Complaint  Patient presents with  . Abdominal Pain    HPI Erika Day is a 84 y.o. female.  HPI  84 year old female with abdominal pain.  Onset early this morning.  Pain is in the upper abdomen to somewhat worse on the right. Associated with nausea and vomited x1. Describes sensation of feeling both very nauseated and constipated simultaneously. Feeling is constant. No fever. No urinary complaints.  Past Medical History:  Diagnosis Date  . Acid reflux disease   . Cancer ([place])   . CLL (chronic lymphocytic leukemia) ([place]) 05/15/2016  . DDD (degenerative disc disease), lumbar   . Degenerative joint disease   . Diabetes mellitus without complication [facility])    Patient states this was a misdiagnosis from years ago  . Dyslipidemia   . History of diabetes mellitus 04/19/2017  . Hypertension   . Marginal zone lymphoma of axilla ([place]) 05/15/2016  . Panic attacks   . Pneumonia 02/2016  . 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 Stroke [facility]) age 86 yo   poorly controlled DM and Hypertension    Patient Active Problem List   Diagnosis Date Noted  . MGUS (monoclonal gammopathy of unknown significance) 02/11/2018  . History of diabetes mellitus 04/19/2017  . Hypercalcemia 02/12/2017  . Chronic leukopenia 02/12/2017  . Postoperative anemia due to acute blood loss 11/26/2016  . Fall 11/23/2016  . Closed fracture of intertrochanteric section of femur ([place]) 11/23/2016  . Other pancytopenia ([place]) 11/13/2016  . Sinusitis 10/14/2016  . Bleeding external hemorrhoids 08/20/2016  . Recurrent ventral incisional hernia s/p primary repair 06/30/2016 07/01/2016  . Pressure ulcer 05/20/2016  . Lower extremity edema 05/20/2016  . Protein-calorie malnutrition, severe ([place]) 05/20/2016  . Volvulus of sigmoid colon ([place])   .  Sigmoid volvulus s/p lap sigmoid colectomy 06/30/2016 05/19/2016  . 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 Marginal zone lymphoma of axilla ([place]) 05/15/2016  . Other fatigue 05/15/2016  . CAP (community acquired pneumonia) 02/13/2016  . Anemia, unspecified 02/13/2016  . Thrombocytopenia ([place]) 02/13/2016  . Hyponatremia 02/13/2016  . Kyphosis 02/01/2016  . Pressure ulcer of coccygeal region 01/31/2016  . Mobility impaired 01/31/2016  . Osteoarthritis 01/31/2016  . Abdominal cramping 04/23/2014  . Essential hypertension 09/06/2011  . Ovarian torsion 08/25/2011    Past Surgical History:  Procedure Laterality Date  . ABDOMINAL HYSTERECTOMY  ? 08/25/2011 ?   Has BSO for left benign ovarian tumor with torsion and broad ligament benign tumor, but does not appear had hysterectomy--[facility], NOT ovarian cancer  . CHOLECYSTECTOMY    . FEMUR IM NAIL Left 11/23/2016   Procedure: INTRAMEDULLARY (IM) NAIL FEMORAL;  Surgeon: Gaynelle Arabian, MD;  Location: [facility];  Service: Orthopedics;  Laterality: Left;  . FLEXIBLE SIGMOIDOSCOPY N/A 05/19/2016   Procedure: FLEXIBLE SIGMOIDOSCOPY;  Surgeon: [name], MD;  Location: [facility];  Service: Endoscopy;  Laterality: N/A;  . FLEXIBLE SIGMOIDOSCOPY N/A 06/28/2016   Procedure: FLEXIBLE SIGMOIDOSCOPY;  Surgeon: Ladene Artist, MD;  Location: [facility];  Service: Endoscopy;  Laterality: N/A;  . LAPAROSCOPIC SIGMOID COLECTOMY N/A 06/30/2016   Procedure: LAPAROSCOPIC SIGMOID COLECTOMY,rigid sigmoidoscopy, repair recurrent incisional hernia;  Surgeon: Michael Boston, MD;  Location: [facility];  Service: General;  Laterality: N/A;  . LAPAROTOMY N/A 04/29/2014   Procedure: EXPLORATORY LAPAROTOMY;  Surgeon: Imogene Burn. Georgette Dover, MD;  Location: [place];  Service: General;  Laterality: N/A;  . LYSIS OF ADHESION N/A 04/29/2014   Procedure: EXTENSIVE LYSIS OF ADHESIONS;  Surgeon: Imogene Burn. Georgette Dover, MD;  Location: [place];  Service: General;  Laterality:  N/A;  . VENTRAL HERNIA REPAIR N/A 04/29/2014   Procedure: PRIMARY REPAIR OF VENTRAL HERNIA;  Surgeon: Imogene Burn. Georgette Dover, MD;  Location: [place];  Service: General;  Laterality: N/A;     OB History   None      Home Medications    Prior to Admission medications   Medication Sig Start Date End Date Taking? Authorizing Provider  dicyclomine (BENTYL) 10 MG capsule 1 cap by mouth every 6 hours as needed for abdominal cramping. 02/11/18  Yes Mack Hook, MD  fexofenadine (ALLEGRA) 180 MG tablet Take 1 tablet (180 mg total) by mouth daily. 03/15/18  Yes Mack Hook, MD  furosemide (LASIX) 20 MG tablet Take 1 tablet (20 mg total) by mouth daily as needed for fluid. 11/05/17  Yes Mack Hook, MD  lisinopril (PRINIVIL,ZESTRIL) 20 MG tablet Take 1 tablet (20 mg total) by mouth daily. 01/14/18  Yes Mack Hook, MD  LOTEMAX 0.5 % ophthalmic suspension Place 1 drop into the left eye daily. 02/11/18  Yes Mack Hook, MD  ondansetron (ZOFRAN) 4 MG tablet 1-2 by mouth every 8 hours as needed for nausea and vomiting. 04/07/18  Yes Mack Hook, MD  pantoprazole (PROTONIX) 40 MG tablet 1 tab by mouth daily 1/2 hour before breakfast on an empty stomach 02/11/18  Yes Mack Hook, MD  polyethylene glycol powder (GLYCOLAX/MIRALAX) powder MIX 1 TABLESPOONFUL IN 8 OZ OF WATER/JUICE D 03/15/18  Yes [provider]  cetirizine (ZYRTEC) 10 MG tablet 1 tab by mouth daily for allergy symptoms 08/23/17   Mack Hook, MD  Elastic Bandages & Supports (MEDICAL COMPRESSION STOCKINGS) MISC 20-30 mm Hg with toe thigh high graduated compression stockings on every morning and off at night. 11/05/17   Mack Hook, MD  fluticasone (FLONASE) 50 MCG/ACT nasal spray Place 2 sprays into both nostrils daily. Patient not taking: Reported on 04/08/2018 02/11/18   Mack Hook, MD  methocarbamol (ROBAXIN) 500 MG tablet Take 1 tablet (500 mg total) by mouth every 6 (six)  hours as needed for muscle spasms. Patient not taking: Reported on 04/08/2018 11/26/16   Joelene Millin, PA-C    Family History Family History  Problem Relation Age of Onset  . Hypertension Mother   . Stroke Mother   . Arthritis Mother   . Alcohol abuse Father     Social History Social History   Tobacco Use  . Smoking status: Former Smoker    Types: Cigarettes    Last attempt to quit: 02/18/1990    Years since quitting: 28.1  . Smokeless tobacco: Former Systems developer    Types: Snuff    Quit date: 02/18/1990  Substance Use Topics  . Alcohol use: No    Alcohol/week: 0.0 oz    Comment: Only drank alcohol between ages of 57 and 57 yo.  Her father gave it to her  . Drug use: No     Allergies   Morphine and related   Review of Systems Review of Systems   All systems reviewed and negative, other than as noted in HPI.  Physical Exam Updated Vital Signs BP 135/63 Comment: Simultaneous filing. User may not have seen previous data.  Pulse (!) 55 Comment: Simultaneous filing. User may not have seen previous data.  Temp 97.6 F (36.4 C) (Oral)   Resp 14 Comment: Simultaneous filing. User may not have seen previous data.  Wt 63 kg (139 lb)   SpO2 98% Comment: Simultaneous filing. User may not have seen previous data.  BMI 21.77 kg/m   Physical Exam  Constitutional: She appears well-developed and well-nourished. No distress.  HENT:  Head: Normocephalic and atraumatic.  Eyes: Conjunctivae are normal. Right eye exhibits no discharge. Left eye exhibits no discharge.  Neck: Neck supple.  Cardiovascular: Normal rate, regular rhythm and normal heart sounds. Exam reveals no gallop and no friction rub.  No murmur heard. Pulmonary/Chest: Effort normal and breath sounds normal. No respiratory distress.  Abdominal: Soft. She exhibits no distension. There is tenderness.  Musculoskeletal: She exhibits no edema or tenderness.  Neurological: She is alert.  Skin: Skin is warm and dry.    Psychiatric: She has a normal mood and affect. Her behavior is normal. Thought content normal.  Nursing note and vitals reviewed.    ED Treatments / Results  Labs (all labs ordered are listed, but only abnormal results are displayed) Labs Reviewed  COMPREHENSIVE METABOLIC PANEL - Abnormal; Notable for the following components:      Result Value   Sodium 132 (*)    Chloride 97 (*)    Glucose, Bld 119 (*)    Creatinine, Ser 1.23 (*)    ALT 11 (*)    GFR calc non Af Amer 39 (*)    GFR calc Af Amer 45 (*)    All other components within normal limits  CBC WITH DIFFERENTIAL/PLATELET  LIPASE, BLOOD  URINALYSIS, ROUTINE W REFLEX MICROSCOPIC    EKG None  Radiology No results found.  Procedures Procedures (including critical care time)  Medications Ordered in ED Medications  0.9 %  sodium chloride infusion ( Intravenous New Bag/Given 04/08/18 1426)  HYDROmorphone (DILAUDID) injection 0.5 mg (0.5 mg Intravenous Given 04/08/18 1426)     Initial Impression / Assessment and Plan / ED Course  I have reviewed the triage vital signs and the nursing notes.  Pertinent labs & imaging results that were available during my care of the patient were reviewed by me and considered in my medical decision making (see chart for details).     88yF with abdominal pain and n/v. Previously abdominal surgery. I suspect she may have SBO. Pain meds. Declines nausea medication initially. Basic labs. CT.  Care transferred to Dr Tyrone Nine at change of shift with CT pending. Disposition pending his clinical discretion.   Final Clinical Impressions(s) / ED Diagnoses   Final diagnoses:  Pain of upper abdomen  Nausea and vomiting, intractability of vomiting not specified, unspecified vomiting type    ED Discharge Orders    None       Virgel Manifold, MD 04/08/18 [phone]

## 2018-04-08 NOTE — Progress Notes (Addendum)
[facility] History and Physical  Erika Day:[id number] DOB: December 13, 1929 DOA: 04/08/2018  Referring physician:  PCP: Mack Hook, MD   Chief Complaint:   HPI:  84 year old female with a history of marginal zone lymphoma, MGUS,diabetes, hypertension,previous history of sigmoid colectomy due to second recurrence of sigmoid volvulus in 2017,who presented to the ER today with chief complaint of right upper quadrant abdominal pain starting at 3 AM this morning associated with an episode of vomiting and diarrhea. Patient describes a feeling of being constipated.she complains of diffuse abdominal pain. No chest pain or shortness of breath , no fever chills, rigors. Last BM was yesterday at 11:00 am . Unable to belch or pass flatus  ED course 128/79 saturation 93%, heart rate 57, sodium 132, potassium 4.3, creatinine 1.23, white blood cell count 4.2, CT abdomen pelvis shows multiple dilated small bowel loops in the abdomen and pelvis, suspicious for developing small bowel obstruction. Surgery was consulted and Dr. Donne Hazel is going to see the patient. NG tube is been placed     Review of Systems: negative for the following  Complete review of systems was done with pertinent positives in history of present illness     Past Medical History:  Diagnosis Date  . Acid reflux disease   . Cancer ([place])   . CLL (chronic lymphocytic leukemia) ([place]) 05/15/2016  . DDD (degenerative disc disease), lumbar   . Degenerative joint disease   . Diabetes mellitus without complication [facility])    Patient states this was a misdiagnosis from years ago  . Dyslipidemia   . History of diabetes mellitus 04/19/2017  . Hypertension   . Marginal zone lymphoma of axilla ([place]) 05/15/2016  . Panic attacks   . Pneumonia 02/2016  . 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 Stroke [facility]) age 89 yo   poorly controlled DM and Hypertension     Past Surgical History:  Procedure  Laterality Date  . ABDOMINAL HYSTERECTOMY  ? 08/25/2011 ?   Has BSO for left benign ovarian tumor with torsion and broad ligament benign tumor, but does not appear had hysterectomy--[facility], NOT ovarian cancer  . CHOLECYSTECTOMY    . FEMUR IM NAIL Left 11/23/2016   Procedure: INTRAMEDULLARY (IM) NAIL FEMORAL;  Surgeon: Gaynelle Arabian, MD;  Location: [facility];  Service: Orthopedics;  Laterality: Left;  . FLEXIBLE SIGMOIDOSCOPY N/A 05/19/2016   Procedure: FLEXIBLE SIGMOIDOSCOPY;  Surgeon: [name], MD;  Location: [facility];  Service: Endoscopy;  Laterality: N/A;  . FLEXIBLE SIGMOIDOSCOPY N/A 06/28/2016   Procedure: FLEXIBLE SIGMOIDOSCOPY;  Surgeon: Ladene Artist, MD;  Location: [facility];  Service: Endoscopy;  Laterality: N/A;  . LAPAROSCOPIC SIGMOID COLECTOMY N/A 06/30/2016   Procedure: LAPAROSCOPIC SIGMOID COLECTOMY,rigid sigmoidoscopy, repair recurrent incisional hernia;  Surgeon: Michael Boston, MD;  Location: [facility];  Service: General;  Laterality: N/A;  . LAPAROTOMY N/A 04/29/2014   Procedure: EXPLORATORY LAPAROTOMY;  Surgeon: Imogene Burn. Georgette Dover, MD;  Location: [place];  Service: General;  Laterality: N/A;  . LYSIS OF ADHESION N/A 04/29/2014   Procedure: EXTENSIVE LYSIS OF ADHESIONS;  Surgeon: Imogene Burn. Georgette Dover, MD;  Location: [place];  Service: General;  Laterality: N/A;  . VENTRAL HERNIA REPAIR N/A 04/29/2014   Procedure: PRIMARY REPAIR OF VENTRAL HERNIA;  Surgeon: Imogene Burn. Georgette Dover, MD;  Location: [place];  Service: General;  Laterality: N/A;      Social History:  reports that she quit smoking about 28 years ago. Her smoking use included cigarettes. She  quit smokeless tobacco use about 28 years ago. Her smokeless tobacco use included snuff. She reports that she does not drink alcohol or use drugs.    Allergies  Allergen Reactions  . Morphine And Related Other (See Comments)    Blood sugar dropped one-time    Family History  Problem Relation Age of Onset  . Hypertension Mother   . Stroke  Mother   . Arthritis Mother   . Alcohol abuse Father         Prior to Admission medications   Medication Sig Start Date End Date Taking? Authorizing Provider  dicyclomine (BENTYL) 10 MG capsule 1 cap by mouth every 6 hours as needed for abdominal cramping. 02/11/18  Yes Mack Hook, MD  fexofenadine (ALLEGRA) 180 MG tablet Take 1 tablet (180 mg total) by mouth daily. 03/15/18  Yes Mack Hook, MD  furosemide (LASIX) 20 MG tablet Take 1 tablet (20 mg total) by mouth daily as needed for fluid. 11/05/17  Yes Mack Hook, MD  lisinopril (PRINIVIL,ZESTRIL) 20 MG tablet Take 1 tablet (20 mg total) by mouth daily. 01/14/18  Yes Mack Hook, MD  LOTEMAX 0.5 % ophthalmic suspension Place 1 drop into the left eye daily. 02/11/18  Yes Mack Hook, MD  ondansetron (ZOFRAN) 4 MG tablet 1-2 by mouth every 8 hours as needed for nausea and vomiting. 04/07/18  Yes Mack Hook, MD  pantoprazole (PROTONIX) 40 MG tablet 1 tab by mouth daily 1/2 hour before breakfast on an empty stomach 02/11/18  Yes Mack Hook, MD  polyethylene glycol powder (GLYCOLAX/MIRALAX) powder MIX 1 TABLESPOONFUL IN 8 OZ OF WATER/JUICE D 03/15/18  Yes [provider]  cetirizine (ZYRTEC) 10 MG tablet 1 tab by mouth daily for allergy symptoms 08/23/17   Mack Hook, MD  Elastic Bandages & Supports (MEDICAL COMPRESSION STOCKINGS) MISC 20-30 mm Hg with toe thigh high graduated compression stockings on every morning and off at night. 11/05/17   Mack Hook, MD  fluticasone (FLONASE) 50 MCG/ACT nasal spray Place 2 sprays into both nostrils daily. Patient not taking: Reported on 04/08/2018 02/11/18   Mack Hook, MD  methocarbamol (ROBAXIN) 500 MG tablet Take 1 tablet (500 mg total) by mouth every 6 (six) hours as needed for muscle spasms. Patient not taking: Reported on 04/08/2018 11/26/16   Joelene Millin, PA-C     Physical Exam: Vitals:   04/08/18 1250  04/08/18 1400 04/08/18 1500 04/08/18 1530  BP: (!) 188/90 (!) 162/87 135/63 128/79  Pulse: 75 61 (!) 55 (!) 57  Resp: 17 16 14 11   Temp: 97.6 F (36.4 C)     TempSrc: Oral     SpO2: 98% 97% 98% 93%  Weight:            Vitals:   04/08/18 1250 04/08/18 1400 04/08/18 1500 04/08/18 1530  BP: (!) 188/90 (!) 162/87 135/63 128/79  Pulse: 75 61 (!) 55 (!) 57  Resp: 17 16 14 11   Temp: 97.6 F (36.4 C)     TempSrc: Oral     SpO2: 98% 97% 98% 93%  Weight:       Constitutional: NAD, calm, comfortable Eyes: PERRL, lids and conjunctivae normal ENMT: Mucous membranes are moist. Posterior pharynx clear of any exudate or lesions.Normal dentition.  Neck: normal, supple, no masses, no thyromegaly Respiratory: clear to auscultation bilaterally, no wheezing, no crackles. Normal respiratory effort. No accessory muscle use.  Cardiovascular: Regular rate and rhythm, no murmurs / rubs / gallops. No extremity edema. 2+ pedal pulses. No  carotid bruits.  Abdomen: diffuse tenderness, no masses palpated. No hepatosplenomegaly. Bowel sounds positive.  Musculoskeletal: no clubbing / cyanosis. No joint deformity upper and lower extremities. Good ROM, no contractures. Normal muscle tone.  Skin: no rashes, lesions, ulcers. No induration Neurologic: CN 2-12 grossly intact. Sensation intact, DTR normal. Strength 5/5 in all 4.  Psychiatric: Normal judgment and insight. Alert and oriented x 3. Normal mood.     Labs on Admission: I have personally reviewed following labs and imaging studies  CBC: Recent Labs  Lab 04/08/18 1430  WBC 4.2  NEUTROABS 3.0  HGB 12.2  HCT 36.4  MCV 87.9  PLT 094    Basic Metabolic Panel: Recent Labs  Lab 04/08/18 1430  NA 132*  K 4.3  CL 97*  CO2 26  GLUCOSE 119*  BUN 20  CREATININE 1.23*  CALCIUM 9.8    GFR: CrCl cannot be calculated (Unknown ideal weight.).  Liver Function Tests: Recent Labs  Lab 04/08/18 1430  AST 20  ALT 11*  ALKPHOS 62  BILITOT 0.7   PROT 7.4  ALBUMIN 4.2   Recent Labs  Lab 04/08/18 1430  LIPASE 47   No results for input(s): AMMONIA in the last 168 hours.  Coagulation Profile: No results for input(s): INR, PROTIME in the last 168 hours. No results for input(s): DDIMER in the last 72 hours.  Cardiac Enzymes: No results for input(s): CKTOTAL, CKMB, CKMBINDEX, TROPONINI in the last 168 hours.  BNP (last 3 results) No results for input(s): PROBNP in the last 8760 hours.  HbA1C: No results for input(s): HGBA1C in the last 72 hours. Lab Results  Component Value Date   HGBA1C 5.4 06/29/2016   HGBA1C 5.4 04/25/2014     CBG: No results for input(s): GLUCAP in the last 168 hours.  Lipid Profile: No results for input(s): CHOL, HDL, LDLCALC, TRIG, CHOLHDL, LDLDIRECT in the last 72 hours.  Thyroid Function Tests: No results for input(s): TSH, T4TOTAL, FREET4, T3FREE, THYROIDAB in the last 72 hours.  Anemia Panel: No results for input(s): VITAMINB12, FOLATE, FERRITIN, TIBC, IRON, RETICCTPCT in the last 72 hours.  Urine analysis:    Component Value Date/Time   COLORURINE YELLOW 03/05/2017 1805   APPEARANCEUR CLEAR 03/05/2017 1805   LABSPEC 1.009 03/05/2017 1805   PHURINE 5.0 03/05/2017 1805   GLUCOSEU NEGATIVE 03/05/2017 1805   HGBUR SMALL (A) 03/05/2017 1805   BILIRUBINUR NEGATIVE 03/05/2017 1805   KETONESUR NEGATIVE 03/05/2017 1805   PROTEINUR NEGATIVE 03/05/2017 1805   UROBILINOGEN 0.2 05/17/2015 2307   NITRITE POSITIVE (A) 03/05/2017 1805   LEUKOCYTESUR SMALL (A) 03/05/2017 1805    Sepsis Labs: @LABRCNTIP (procalcitonin:4,lacticidven:4) )No results found for this or any previous visit (from the past 240 hour(s)).       Radiological Exams on Admission: Ct Abdomen Pelvis W Contrast  Result Date: 04/08/2018 CLINICAL DATA:  Right upper quadrant abdomen pain since this morning. History of lymphoma. Patient has had prior surgery for obstruction in the past demonstrating lymphoma involvement of  GI tract. EXAM: CT ABDOMEN AND PELVIS WITH CONTRAST TECHNIQUE: Multidetector CT imaging of the abdomen and pelvis was performed using the standard protocol following bolus administration of intravenous contrast. CONTRAST:  59mL OMNIPAQUE IOHEXOL 300 MG/ML  SOLN COMPARISON:  Mar 05, 2017 FINDINGS: Lower chest: Marked elevated right hemidiaphragm with dilated small bowel bowel loops in the mid and lower right hemithorax. The heart size is enlarged. There is atelectasis of the posterior lung bases. Hepatobiliary: No focal liver abnormality is seen. Status post  cholecystectomy. Mild intrahepatic biliary ductal dilatation is identified, likely postsurgical. Pancreas: Unremarkable. No pancreatic ductal dilatation or surrounding inflammatory changes. Spleen: Normal in size without focal abnormality. Adrenals/Urinary Tract: Adrenal glands are unremarkable. Kidneys are normal, without focal lesion, or hydronephrosis. Small nonobstructing stone is identified within the left kidney. Asymmetric right side and anterior bladder bowel wall thickening is noted. Stomach/Bowel: There are multiple dilated small bowel loops in the abdomen pelvis including small bowel loops extending in the right mid and lower thorax. There is no evidence of diverticulitis. Moderate bowel content is identified throughout colon. The stomach is stable. Vascular/Lymphatic: Aortic atherosclerosis. No enlarged abdominal or pelvic lymph nodes. Reproductive: Status post prior hysterectomy. Solid masslike area with associated calcification in the right adnexa question right ovary unchanged compared prior CT. Other: Ascites is noted in the abdomen and pelvis. Musculoskeletal: Degenerative joint changes of the spine are noted. Prior left hip surgery is noted. IMPRESSION: Multiple dilated small bowel loops in the abdomen and pelvis including small bowel is loops extending in the right mid and lower thorax due to mark elevated right hemidiaphragm. Findings are  suspicious for developing small bowel obstruction. Asymmetric right side and anterior bladder bowel wall thickening. Consider further evaluation with direct visualization. Ascites is noted in the abdomen and pelvis. Status post prior cholecystectomy and hysterectomy. Electronically Signed   By: Abelardo Diesel M.D.   On: 04/08/2018 16:26   Ct Abdomen Pelvis W Contrast  Result Date: 04/08/2018 CLINICAL DATA:  Right upper quadrant abdomen pain since this morning. History of lymphoma. Patient has had prior surgery for obstruction in the past demonstrating lymphoma involvement of GI tract. EXAM: CT ABDOMEN AND PELVIS WITH CONTRAST TECHNIQUE: Multidetector CT imaging of the abdomen and pelvis was performed using the standard protocol following bolus administration of intravenous contrast. CONTRAST:  64mL OMNIPAQUE IOHEXOL 300 MG/ML  SOLN COMPARISON:  Mar 05, 2017 FINDINGS: Lower chest: Marked elevated right hemidiaphragm with dilated small bowel bowel loops in the mid and lower right hemithorax. The heart size is enlarged. There is atelectasis of the posterior lung bases. Hepatobiliary: No focal liver abnormality is seen. Status post cholecystectomy. Mild intrahepatic biliary ductal dilatation is identified, likely postsurgical. Pancreas: Unremarkable. No pancreatic ductal dilatation or surrounding inflammatory changes. Spleen: Normal in size without focal abnormality. Adrenals/Urinary Tract: Adrenal glands are unremarkable. Kidneys are normal, without focal lesion, or hydronephrosis. Small nonobstructing stone is identified within the left kidney. Asymmetric right side and anterior bladder bowel wall thickening is noted. Stomach/Bowel: There are multiple dilated small bowel loops in the abdomen pelvis including small bowel loops extending in the right mid and lower thorax. There is no evidence of diverticulitis. Moderate bowel content is identified throughout colon. The stomach is stable. Vascular/Lymphatic: Aortic  atherosclerosis. No enlarged abdominal or pelvic lymph nodes. Reproductive: Status post prior hysterectomy. Solid masslike area with associated calcification in the right adnexa question right ovary unchanged compared prior CT. Other: Ascites is noted in the abdomen and pelvis. Musculoskeletal: Degenerative joint changes of the spine are noted. Prior left hip surgery is noted. IMPRESSION: Multiple dilated small bowel loops in the abdomen and pelvis including small bowel is loops extending in the right mid and lower thorax due to mark elevated right hemidiaphragm. Findings are suspicious for developing small bowel obstruction. Asymmetric right side and anterior bladder bowel wall thickening. Consider further evaluation with direct visualization. Ascites is noted in the abdomen and pelvis. Status post prior cholecystectomy and hysterectomy. Electronically Signed   By: Mallie Darting.D.  On: 04/08/2018 16:26      EKG: Independently reviewed. 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 Inpatient status is judged to be reasonable and necessary in order to provide the required intensity of service to ensure the patient's safety. The patient's presenting symptoms, physical exam findings, and initial radiographic and laboratory data in the context of their chronic comorbidities is felt to place them at high risk for further clinical deterioration. Furthermore, it is not anticipated that the patient will be medically stable for discharge from the hospital within 2 midnights of admission. The following factors support the patient status of inpatient.    "           The patient's presenting symptoms include low back pain. "           The worrisome physical exam findings include and inability to walk. "           The initial radiographic and laboratory data are worrisome because of sacral fracture on CT. "           The chronic co-morbidities include history of hypertension.     * I certify that at the point of admission it is my clinical judgment that the patient will require inpatient hospital care spanning beyond 2 midnights from the point of admission due to high intensity of service, high risk for further deterioration and high frequency of surveillance required.*    Disposition plan: Further plan will depend as patient's clinical course evolves and further radiologic and laboratory data become available. Likely home when stable    At the time of admission, it appears that the appropriate admission status for this patient is INPATIENT . This is judged to be reasonable and necessary in order to provide the required intensity of service to ensure the patient's safety given the presenting symptoms, physical exam findings, and initial radiographic and laboratory data in the context of their chronic comorbidities.   Reyne Dumas MD [facility] Pager [phone]  If 7PM-7AM, please contact night-coverage [email or web address] Password [facility]  04/08/2018, 5:12 PM

## 2018-04-08 NOTE — ED Provider Notes (Signed)
I received the patient in signout from [place], briefly patient is a 84 year old female with epigastric and RUQ abdominal pain.  Plan is to await CT scan to further evaluate.  Again concerning for early small bowel obstruction.  I discussed the case with Dr. Donne Hazel, general surgery.  Will admit to the hospitalist.  He will come evaluate patient at bedside.  The patients results and plan were reviewed and discussed.   Any x-rays performed were independently reviewed by myself.   Differential diagnosis were considered with the presenting HPI.  Medications  0.9 %  sodium chloride infusion ( Intravenous New Bag/Given 04/08/18 2156)  fluticasone (FLONASE) 50 MCG/ACT nasal spray 2 spray (2 sprays Each Nare Not Given 04/08/18 2242)  loteprednol (LOTEMAX) 0.5 % ophthalmic suspension 1 drop (1 drop Left Eye Given 04/08/18 2241)  famotidine (PEPCID) IVPB 20 mg premix (0 mg Intravenous Stopped 04/08/18 2226)  enoxaparin (LOVENOX) injection 40 mg (40 mg Subcutaneous Given 04/08/18 2156)  acetaminophen (TYLENOL) tablet 650 mg (has no administration in time range)    Or  acetaminophen (TYLENOL) suppository 650 mg (has no administration in time range)  ondansetron (ZOFRAN) tablet 4 mg (has no administration in time range)    Or  ondansetron (ZOFRAN) injection 4 mg (has no administration in time range)  levalbuterol (XOPENEX) nebulizer solution 0.63 mg (has no administration in time range)  HYDROmorphone (DILAUDID) injection 0.5 mg (0.5 mg Intravenous Given 04/08/18 1426)  iohexol (OMNIPAQUE) 300 MG/ML solution 75 mL (75 mLs Intravenous Contrast Given 04/08/18 1552)  diatrizoate meglumine-sodium (GASTROGRAFIN) 66-10 % solution 90 mL (90 mLs Per NG tube Given 04/08/18 2241)    Vitals:   04/08/18 1808 04/08/18 1830 04/08/18 1906 04/08/18 2000  BP: (!) 178/71 (!) 179/82 (!) 187/72   Pulse: 80  61   Resp: 20 17 16    Temp:   (!) 97.4 F (36.3 C)   TempSrc:   Oral   SpO2: 98%  97%   Weight:    72 kg (158 lb 11.7  oz)  Height:    5\' 4"  (1.626 m)    Final diagnoses:  Pain of upper abdomen  Nausea and vomiting, intractability of vomiting not specified, unspecified vomiting type  Encounter for imaging study to confirm nasogastric (NG) tube placement  Small bowel obstruction ([place])    Admission/ observation were discussed with the admitting physician, patient and/or family and they are comfortable with the plan.     Deno Etienne, DO 04/08/18 2332

## 2018-04-08 NOTE — Consult Note (Signed)
Reason for Consult:abd pain Referring Physician: [name] is an 84 y.o. female.  HPI: 87 yof with pmh of mgus, dm, lymphoma, htn, who has history of elap in 2015 for sbo with adhesions and a sigmoid colectomy for volvulus as well.  She comes in with upper abdominal pain starting overnight.  This is associated with nausea and emesis. Has had some flatus and diarrhea but last early this am. Feels bloated now and some nausea.  No cp/sob. Urinating fine.  She underwent ct scan that showed possible sbo and I was asked to see her.   She lives at home with her husband.  Past Medical History:  Diagnosis Date  . Acid reflux disease   . Cancer ([place])   . CLL (chronic lymphocytic leukemia) ([place]) 05/15/2016  . DDD (degenerative disc disease), lumbar   . Degenerative joint disease   . Diabetes mellitus without complication [facility])    Patient states this was a misdiagnosis from years ago  . Dyslipidemia   . History of diabetes mellitus 04/19/2017  . Hypertension   . 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 Panic attacks   . Pneumonia 02/2016  . 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 Stroke [facility]) age 65 yo   poorly controlled DM and Hypertension    Past Surgical History:  Procedure Laterality Date  . ABDOMINAL HYSTERECTOMY  ? 08/25/2011 ?   Has BSO for left benign ovarian tumor with torsion and broad ligament benign tumor, but does not appear had hysterectomy--[facility], NOT ovarian cancer  . CHOLECYSTECTOMY    . FEMUR IM NAIL Left 11/23/2016   Procedure: INTRAMEDULLARY (IM) NAIL FEMORAL;  Surgeon: Gaynelle Arabian, MD;  Location: [facility];  Service: Orthopedics;  Laterality: Left;  . FLEXIBLE SIGMOIDOSCOPY N/A 05/19/2016   Procedure: FLEXIBLE SIGMOIDOSCOPY;  Surgeon: [name], MD;  Location: [facility];  Service: Endoscopy;  Laterality: N/A;  . FLEXIBLE SIGMOIDOSCOPY N/A 06/28/2016   Procedure: FLEXIBLE SIGMOIDOSCOPY;  Surgeon: Ladene Artist, MD;   Location: [facility];  Service: Endoscopy;  Laterality: N/A;  . LAPAROSCOPIC SIGMOID COLECTOMY N/A 06/30/2016   Procedure: LAPAROSCOPIC SIGMOID COLECTOMY,rigid sigmoidoscopy, repair recurrent incisional hernia;  Surgeon: Michael Boston, MD;  Location: [facility];  Service: General;  Laterality: N/A;  . LAPAROTOMY N/A 04/29/2014   Procedure: EXPLORATORY LAPAROTOMY;  Surgeon: Imogene Burn. Georgette Dover, MD;  Location: [facility];  Service: General;  Laterality: N/A;  . LYSIS OF ADHESION N/A 04/29/2014   Procedure: EXTENSIVE LYSIS OF ADHESIONS;  Surgeon: Imogene Burn. Georgette Dover, MD;  Location: [facility];  Service: General;  Laterality: N/A;  . VENTRAL HERNIA REPAIR N/A 04/29/2014   Procedure: PRIMARY REPAIR OF VENTRAL HERNIA;  Surgeon: Imogene Burn. Georgette Dover, MD;  Location: [facility];  Service: General;  Laterality: N/A;    Family History  Problem Relation Age of Onset  . Hypertension Mother   . Stroke Mother   . Arthritis Mother   . Alcohol abuse Father     Social History:  reports that she quit smoking about 28 years ago. Her smoking use included cigarettes. She quit smokeless tobacco use about 28 years ago. Her smokeless tobacco use included snuff. She reports that she does not drink alcohol or use drugs.  Allergies:  Allergies  Allergen Reactions  . Morphine And Related Other (See Comments)    Blood sugar dropped one-time    Medications: I have reviewed the patient's current medications.  Results for orders placed or performed  during the hospital encounter of 04/08/18 (from the past 48 hour(s))  CBC with Differential     Status: None   Collection Time: 04/08/18  2:30 PM  Result Value Ref Range   WBC 4.2 4.0 - 10.5 K/uL   RBC 4.14 3.87 - 5.11 MIL/uL   Hemoglobin 12.2 12.0 - 15.0 g/dL   HCT 36.4 36.0 - 46.0 %   MCV 87.9 78.0 - 100.0 fL   MCH 29.5 26.0 - 34.0 pg   MCHC 33.5 30.0 - 36.0 g/dL   RDW 12.6 11.5 - 15.5 %   Platelets 161 150 - 400 K/uL   Neutrophils Relative % 70 %   Neutro Abs 3.0 1.7 - 7.7 K/uL    Lymphocytes Relative 19 %   Lymphs Abs 0.8 0.7 - 4.0 K/uL   Monocytes Relative 10 %   Monocytes Absolute 0.4 0.1 - 1.0 K/uL   Eosinophils Relative 1 %   Eosinophils Absolute 0.0 0.0 - 0.7 K/uL   Basophils Relative 0 %   Basophils Absolute 0.0 0.0 - 0.1 K/uL    Comment: Performed at [facility], [place] [address] [place], [place] [address]  Comprehensive metabolic panel     Status: Abnormal   Collection Time: 04/08/18  2:30 PM  Result Value Ref Range   Sodium 132 (L) 135 - 145 mmol/L   Potassium 4.3 3.5 - 5.1 mmol/L   Chloride 97 (L) 101 - 111 mmol/L   CO2 26 22 - 32 mmol/L   Glucose, Bld 119 (H) 65 - 99 mg/dL   BUN 20 6 - 20 mg/dL   Creatinine, Ser 1.23 (H) 0.44 - 1.00 mg/dL   Calcium 9.8 8.9 - 10.3 mg/dL   Total Protein 7.4 6.5 - 8.1 g/dL   Albumin 4.2 3.5 - 5.0 g/dL   AST 20 15 - 41 U/L   ALT 11 (L) 14 - 54 U/L   Alkaline Phosphatase 62 38 - 126 U/L   Total Bilirubin 0.7 0.3 - 1.2 mg/dL   GFR calc non Af Amer 39 (L) >60 mL/min   GFR calc Af Amer 45 (L) >60 mL/min    Comment: (NOTE) The eGFR has been calculated using the CKD EPI equation. This calculation has not been validated in all clinical situations. eGFR's persistently <60 mL/min signify possible Chronic Kidney Disease.    Anion gap 9 5 - 15    Comment: Performed at [facility], [place] [address], [place], [place] [address]  Lipase, blood     Status: None   Collection Time: 04/08/18  2:30 PM  Result Value Ref Range   Lipase 47 11 - 51 U/L    Comment: Performed at [facility], [place] [address], [place], [place] [address]    Ct Abdomen Pelvis W Contrast  Result Date: 04/08/2018 CLINICAL DATA:  Right upper quadrant abdomen pain since this morning. History of lymphoma. Patient has had prior surgery for obstruction in the past demonstrating lymphoma involvement of GI tract. EXAM: CT ABDOMEN AND PELVIS WITH CONTRAST TECHNIQUE: Multidetector CT imaging of the abdomen and  pelvis was performed using the standard protocol following bolus administration of intravenous contrast. CONTRAST:  31m OMNIPAQUE IOHEXOL 300 MG/ML  SOLN COMPARISON:  Mar 05, 2017 FINDINGS: Lower chest: Marked elevated right hemidiaphragm with dilated small bowel bowel loops in the mid and lower right hemithorax. The heart size is enlarged. There is atelectasis of the posterior lung bases. Hepatobiliary: No focal liver abnormality is seen. Status post  cholecystectomy. Mild intrahepatic biliary ductal dilatation is identified, likely postsurgical. Pancreas: Unremarkable. No pancreatic ductal dilatation or surrounding inflammatory changes. Spleen: Normal in size without focal abnormality. Adrenals/Urinary Tract: Adrenal glands are unremarkable. Kidneys are normal, without focal lesion, or hydronephrosis. Small nonobstructing stone is identified within the left kidney. Asymmetric right side and anterior bladder bowel wall thickening is noted. Stomach/Bowel: There are multiple dilated small bowel loops in the abdomen pelvis including small bowel loops extending in the right mid and lower thorax. There is no evidence of diverticulitis. Moderate bowel content is identified throughout colon. The stomach is stable. Vascular/Lymphatic: Aortic atherosclerosis. No enlarged abdominal or pelvic lymph nodes. Reproductive: Status post prior hysterectomy. Solid masslike area with associated calcification in the right adnexa question right ovary unchanged compared prior CT. Other: Ascites is noted in the abdomen and pelvis. Musculoskeletal: Degenerative joint changes of the spine are noted. Prior left hip surgery is noted. IMPRESSION: Multiple dilated small bowel loops in the abdomen and pelvis including small bowel is loops extending in the right mid and lower thorax due to mark elevated right hemidiaphragm. Findings are suspicious for developing small bowel obstruction. Asymmetric right side and anterior bladder bowel wall  thickening. Consider further evaluation with direct visualization. Ascites is noted in the abdomen and pelvis. Status post prior cholecystectomy and hysterectomy. Electronically Signed   By: Abelardo Diesel M.D.   On: 04/08/2018 16:26    Review of Systems  Gastrointestinal: Positive for abdominal pain, nausea and vomiting.  All other systems reviewed and are negative.  Blood pressure 128/79, pulse (!) 57, temperature 97.6 F (36.4 C), temperature source Oral, resp. rate 11, weight 63 kg (139 lb), SpO2 93 %. Physical Exam  Vitals reviewed. Constitutional: She is oriented to person, place, and time. She appears well-developed and well-nourished.  HENT:  Head: Normocephalic and atraumatic.  Right Ear: External ear normal.  Mouth/Throat: Oropharynx is clear and moist.  Eyes: Pupils are equal, round, and reactive to light. No scleral icterus.  Neck: Neck supple.  Cardiovascular: Normal rate, regular rhythm and normal heart sounds.  Respiratory: Effort normal and breath sounds normal. She has no wheezes.  GI: She exhibits distension (mild ). There is no tenderness. No hernia.    Musculoskeletal: She exhibits edema.  Lymphadenopathy:    She has no cervical adenopathy.  Neurological: She is alert and oriented to person, place, and time.  Skin: Skin is warm and dry.  Psychiatric: She has a normal mood and affect. Her behavior is normal.    Assessment/Plan: SBO, likely adhesive  She clinically appears to have sbo. Has some ascites on ct but she is nontender, with nl wbc, nl heart rate and afebrile.  We discussed plan for sbo treatment. She remembers from previous.  Will do sbo protocol and hopefully resolve conservatively.   Erika Day 04/08/2018, 5:46 PM

## 2018-04-09 ENCOUNTER — Inpatient Hospital Stay (HOSPITAL_COMMUNITY): Payer: Medicare Other

## 2018-04-09 DIAGNOSIS — M150 Primary generalized (osteo)arthritis: Secondary | ICD-10-CM

## 2018-04-09 LAB — COMPREHENSIVE METABOLIC PANEL
ALK PHOS: 57 U/L (ref 38–126)
ALT: 11 U/L — AB (ref 14–54)
AST: 22 U/L (ref 15–41)
Albumin: 4 g/dL (ref 3.5–5.0)
Anion gap: 11 (ref 5–15)
BUN: 19 mg/dL (ref 6–20)
CO2: 25 mmol/L (ref 22–32)
CREATININE: 1.2 mg/dL — AB (ref 0.44–1.00)
Calcium: 9.8 mg/dL (ref 8.9–10.3)
Chloride: 100 mmol/L — ABNORMAL LOW (ref 101–111)
GFR, EST AFRICAN AMERICAN: 46 mL/min — AB (ref >60)
GFR, EST NON AFRICAN AMERICAN: 40 mL/min — AB (ref >60)
Glucose, Bld: 108 mg/dL — ABNORMAL HIGH (ref 65–99)
Potassium: 4.4 mmol/L (ref 3.5–5.1)
SODIUM: 136 mmol/L (ref 135–145)
Total Bilirubin: 0.6 mg/dL (ref 0.3–1.2)
Total Protein: 7.2 g/dL (ref 6.5–8.1)

## 2018-04-09 LAB — PHOSPHORUS: Phosphorus: 3.8 mg/dL (ref 2.5–4.6)

## 2018-04-09 LAB — CBC
HCT: 37.2 % (ref 36.0–46.0)
Hemoglobin: 12.5 g/dL (ref 12.0–15.0)
MCH: 29.6 pg (ref 26.0–34.0)
MCHC: 33.6 g/dL (ref 30.0–36.0)
MCV: 88.2 fL (ref 78.0–100.0)
PLATELETS: 129 10*3/uL — AB (ref 150–400)
RBC: 4.22 MIL/uL (ref 3.87–5.11)
RDW: 12.6 % (ref 11.5–15.5)
WBC: 3.4 10*3/uL — ABNORMAL LOW (ref 4.0–10.5)

## 2018-04-09 LAB — MAGNESIUM: Magnesium: 1.8 mg/dL (ref 1.7–2.4)

## 2018-04-09 MED ORDER — FENTANYL CITRATE (PF) 100 MCG/2ML IJ SOLN
12.5000 ug | INTRAMUSCULAR | Status: DC | PRN
  Administered 2018-04-09 (×3): 25 ug via INTRAVENOUS
  Administered 2018-04-09: 12.5 ug via INTRAVENOUS
  Administered 2018-04-10 – 2018-04-11 (×6): 25 ug via INTRAVENOUS
  Filled 2018-04-09 (×10): qty 2

## 2018-04-09 NOTE — Progress Notes (Signed)
Subjective/Chief Complaint: No complaints. Feels better. Passing flatus. She was dreaming and pulled ng out   Objective: Vital signs in last 24 hours: Temp:  [97.4 F (36.3 C)-97.9 F (36.6 C)] 97.9 F (36.6 C) (06/08 0619) Pulse Rate:  [55-81] 81 (06/08 0619) Resp:  [11-20] 16 (06/08 0619) BP: (128-188)/(63-90) 140/72 (06/08 0619) SpO2:  [93 %-98 %] 95 % (06/08 0619) Weight:  [63 kg (139 lb)-72 kg (158 lb 11.7 oz)] 72 kg (158 lb 11.7 oz) (06/07 2000) Last BM Date: 04/06/18  Intake/Output from previous day: 06/07 0701 - 06/08 0700 In: -  Out: 625 [Emesis/NG output:625] Intake/Output this shift: No intake/output data recorded.  General appearance: alert and cooperative Resp: clear to auscultation bilaterally Cardio: regular rate and rhythm GI: soft, nontender. not distended. good bs  Lab Results:  Recent Labs    04/08/18 1430 04/09/18 0440  WBC 4.2 3.4*  HGB 12.2 12.5  HCT 36.4 37.2  PLT 161 129*   BMET Recent Labs    04/08/18 1430 04/09/18 0440  NA 132* 136  K 4.3 4.4  CL 97* 100*  CO2 26 25  GLUCOSE 119* 108*  BUN 20 19  CREATININE 1.23* 1.20*  CALCIUM 9.8 9.8   PT/INR No results for input(s): LABPROT, INR in the last 72 hours. ABG No results for input(s): PHART, HCO3 in the last 72 hours.  Invalid input(s): PCO2, PO2  Studies/Results: Dg Abdomen 1 View  Result Date: 04/08/2018 CLINICAL DATA:  Nasogastric tube placement. EXAM: ABDOMEN - 1 VIEW COMPARISON:  Abdomen and pelvis CT obtained earlier today. FINDINGS: Interval nasogastric tube with its tip in the distal stomach and side hole in the mid to distal stomach. Dilated small bowel loops, better seen on the recent CT. Excreted contrast in the urinary bladder. Extensive lumbar spine degenerative changes and mild scoliosis. Left proximal femur fixation hardware. IMPRESSION: 1. Nasogastric tube tip and side hole in the stomach. 2. Previously noted changes of small bowel obstruction. This may be due  to an internal hernia, based on swirling of the mesentery in the right mid to upper abdomen on the CT. Electronically Signed   By: Claudie Revering M.D.   On: 04/08/2018 18:54   Ct Abdomen Pelvis W Contrast  Result Date: 04/08/2018 CLINICAL DATA:  Right upper quadrant abdomen pain since this morning. History of lymphoma. Patient has had prior surgery for obstruction in the past demonstrating lymphoma involvement of GI tract. EXAM: CT ABDOMEN AND PELVIS WITH CONTRAST TECHNIQUE: Multidetector CT imaging of the abdomen and pelvis was performed using the standard protocol following bolus administration of intravenous contrast. CONTRAST:  85mL OMNIPAQUE IOHEXOL 300 MG/ML  SOLN COMPARISON:  Mar 05, 2017 FINDINGS: Lower chest: Marked elevated right hemidiaphragm with dilated small bowel bowel loops in the mid and lower right hemithorax. The heart size is enlarged. There is atelectasis of the posterior lung bases. Hepatobiliary: No focal liver abnormality is seen. Status post cholecystectomy. Mild intrahepatic biliary ductal dilatation is identified, likely postsurgical. Pancreas: Unremarkable. No pancreatic ductal dilatation or surrounding inflammatory changes. Spleen: Normal in size without focal abnormality. Adrenals/Urinary Tract: Adrenal glands are unremarkable. Kidneys are normal, without focal lesion, or hydronephrosis. Small nonobstructing stone is identified within the left kidney. Asymmetric right side and anterior bladder bowel wall thickening is noted. Stomach/Bowel: There are multiple dilated small bowel loops in the abdomen pelvis including small bowel loops extending in the right mid and lower thorax. There is no evidence of diverticulitis. Moderate bowel content is identified throughout  colon. The stomach is stable. Vascular/Lymphatic: Aortic atherosclerosis. No enlarged abdominal or pelvic lymph nodes. Reproductive: Status post prior hysterectomy. Solid masslike area with associated calcification in the right  adnexa question right ovary unchanged compared prior CT. Other: Ascites is noted in the abdomen and pelvis. Musculoskeletal: Degenerative joint changes of the spine are noted. Prior left hip surgery is noted. IMPRESSION: Multiple dilated small bowel loops in the abdomen and pelvis including small bowel is loops extending in the right mid and lower thorax due to mark elevated right hemidiaphragm. Findings are suspicious for developing small bowel obstruction. Asymmetric right side and anterior bladder bowel wall thickening. Consider further evaluation with direct visualization. Ascites is noted in the abdomen and pelvis. Status post prior cholecystectomy and hysterectomy. Electronically Signed   By: Abelardo Diesel M.D.   On: 04/08/2018 16:26    Anti-infectives: Anti-infectives (From admission, onward)   Start     Dose/Rate Route Frequency Ordered Stop   04/08/18 1715  vancomycin (VANCOCIN) 1,250 mg in sodium chloride 0.9 % 250 mL IVPB  Status:  Discontinued     1,250 mg 166.7 mL/hr over 90 Minutes Intravenous  Once 04/08/18 1711 04/08/18 1732   04/08/18 1715  cefTRIAXone (ROCEPHIN) 1 g in sodium chloride 0.9 % 100 mL IVPB  Status:  Discontinued     1 g 200 mL/hr over 30 Minutes Intravenous  Once 04/08/18 1711 04/08/18 1732   04/08/18 1715  ampicillin (OMNIPEN) 1 g in sodium chloride 0.9 % 100 mL IVPB  Status:  Discontinued     1 g 300 mL/hr over 20 Minutes Intravenous  Once 04/08/18 1711 04/08/18 1728      Assessment/Plan: s/p * No surgery found * check abd xray today.  Continue bowel rest for now OOB sbo clinically improving  LOS: 1 day    TOTH III,PAUL S 04/09/2018

## 2018-04-09 NOTE — Progress Notes (Signed)
Patient removed NG tube at this time. This nurse went in to replace the NG tube as patient is c/o nausea and patient refused to have NG tube placed at this time stating "it hurts to have it in there"

## 2018-04-09 NOTE — Progress Notes (Signed)
PROGRESS NOTE    Erika Day  HYQ:[id number] DOB: [birth year] DOA: 04/08/2018 PCP: Mack Hook, MD   Brief Narrative:  84 year old female with a history of marginal zone lymphoma, MGUS,diabetes, hypertension,previous history of sigmoid colectomy due to second recurrence of sigmoid volvulus in 2017,who presented to the ER today with chief complaint of right upper quadrant abdominal pain starting at 3 AM this morning associated with an episode of vomiting and diarrhea. Patient describes a feeling of being constipated. She complained of diffuse abdominal pain. No chest pain or shortness of breath , no fever chills, rigors. Last BM was yesterday at 11:00 am . Unable to belch or pass flatus. CT abdomen pelvis shows multiple dilated small bowel loops in the abdomen and pelvis, suspicious for developing small bowel obstruction. Surgery was consulted and NGT Placed and patient undewent Small Bowel Protocol but unfortunately removed NGT overnight. Will continue to Monitor and give Supportive Care for SBO and defer to Surgery to replace NGT.  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   Volvulus of sigmoid colon (HCC)   MGUS (monoclonal gammopathy of unknown significance)  Acute Small Bowel Obstruction (HCC) -Has Extensive prior surgical history,  including history of recurrent sigmoid volvulus, status post sigmoid colectomy -Now presenting with recurrent small bowel obstruction -NG tube was placed but unfortunately patient pulled it out overnight -General Surgery Consulted and placed patient on Small Bowel Protocol with Gastrografin -C/w NS at 100 mL/hr -Supportive Care and pain control with IV fentanyl 12.5 to 25 mcg every 2 hours  PRN for moderate and severe pain -NPO and continue Bowel Rest -Ambulate and OOB -Maintain K+ >4.0 and Mag Level >2.0 -May need NGT reinserted but will defer to General Surgery -Repeat KUB this AM showed Persistent dilated loops of small bowel with minimal change from the recent comparison examination. Findings remain compatible with a bowel obstruction.  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 / CLL/ MGUS -Patient is followed by  Dr.Gorsuc -This problem appears to be stable  CKD Stage 3 -Stable -BUN/Cr went from 20/1.23 -> 19/1.20 -Avoid Nephrotoxic Medications if possible and hold Lisinopril -Repeat CMP in AM   GERD -Continue Famotidine IV  DVT prophylaxis: Enoxaparin 40 mg sq q24h Code Status: FULL CODE Family Communication: No family present at bedside  Disposition Plan: Remain Inpatient for continued workup and Treatment   Consultants:   General Surgery    Procedures: None   Antimicrobials:  Anti-infectives (From admission, onward)   Start     Dose/Rate Route Frequency Ordered Stop   04/08/18 1715  vancomycin (VANCOCIN) 1,250 mg in sodium chloride 0.9 % 250 mL IVPB  Status:  Discontinued     1,250 mg 166.7 mL/hr over 90 Minutes Intravenous  Once 04/08/18 1711 04/08/18 1732   04/08/18 1715  cefTRIAXone (ROCEPHIN) 1 g in sodium chloride 0.9 % 100  mL IVPB  Status:  Discontinued     1 g 200 mL/hr over 30 Minutes Intravenous  Once 04/08/18 1711 04/08/18 1732   04/08/18 1715  ampicillin (OMNIPEN) 1 g in sodium chloride 0.9 % 100 mL IVPB  Status:  Discontinued     1 g 300 mL/hr  over 20 Minutes Intravenous  Once 04/08/18 1711 04/08/18 1728     Subjective: Seen and examined abdomen was soft however she still complained of abdominal pain.  Unable to pass any gas or have a bowel movement.  No chest pain or shortness of breath,.  No lightheadedness or dizziness.  No other concerns or complaint at this time.  Objective: Vitals:   04/08/18 1830 04/08/18 1906 04/08/18 2000 04/09/18 0619  BP: (!) 179/82 (!) 187/72  140/72  Pulse:  61  81  Resp: 17 16  16   Temp:  (!) 97.4 F (36.3 C)  97.9 F (36.6 C)  TempSrc:  Oral  Oral  SpO2:  97%  95%  Weight:   72 kg (158 lb 11.7 oz)   Height:   5\' 4"  (1.626 m)     Intake/Output Summary (Last 24 hours) at 04/09/2018 1829 Last data filed at 04/09/2018 0600 Gross per 24 hour  Intake -  Output 625 ml  Net -625 ml   Filed Weights   04/08/18 1249 04/08/18 2000  Weight: 63 kg (139 lb) 72 kg (158 lb 11.7 oz)   Examination: Physical Exam:  Constitutional: WN/WD elderly AAF in NAD and appears calm Eyes: Lids and conjunctivae normal, sclerae anicteric  ENMT: External Ears, Nose appear normal. Grossly normal hearing.   Neck: Appears normal, supple, no cervical masses, normal ROM, no appreciable thyromegaly; no JVD Respiratory: Diminished to auscultation bilaterally, no wheezing, rales, rhonchi or crackles. Normal respiratory effort and patient is not tachypenic. No accessory muscle use.  Cardiovascular: RRR, no murmurs / rubs / gallops. S1 and S2 auscultated. No extremity edema.  Abdomen: Soft, Tender to palpate, Slightly distended. No masses palpated. No appreciable hepatosplenomegaly. Bowel sounds hyperactive and high pitched bowel sounds.  GU: Deferred. Musculoskeletal: No clubbing / cyanosis of digits/nails. No joint deformity upper and lower extremities. .  Skin: No rashes, lesions, ulcers on a limited skin evaluation. No induration; Warm and dry.  Neurologic: CN 2-12 grossly intact with no focal deficits. Romberg sign  cerebellar reflexes not assessed.  Psychiatric: Normal judgment and insight. Alert and oriented x 3. Normal mood and appropriate affect.   Data Reviewed: I have personally reviewed following labs and imaging studies  CBC: Recent Labs  Lab 04/08/18 1430 04/09/18 0440  WBC 4.2 3.4*  NEUTROABS 3.0  --   HGB 12.2 12.5  HCT 36.4 37.2  MCV 87.9 88.2  PLT 161 937*   Basic Metabolic Panel: Recent Labs  Lab 04/08/18 1430 04/09/18 0440  NA 132* 136  K 4.3 4.4  CL 97* 100*  CO2 26 25  GLUCOSE 119* 108*  BUN 20 19  CREATININE 1.23* 1.20*  CALCIUM 9.8 9.8  MG 2.0  --    GFR: Estimated Creatinine Clearance: 32.7 mL/min (A) (by C-G formula based on SCr of 1.2 mg/dL (H)). Liver Function Tests: Recent Labs  Lab 04/08/18 1430 04/09/18 0440  AST 20 22  ALT 11* 11*  ALKPHOS 62 57  BILITOT 0.7 0.6  PROT 7.4 7.2  ALBUMIN 4.2 4.0   Recent Labs  Lab 04/08/18 1430  LIPASE 47   No results for input(s): AMMONIA in the last 168 hours.  Coagulation Profile: No results for input(s): INR, PROTIME in the last 168 hours. Cardiac Enzymes: No results for input(s): CKTOTAL, CKMB, CKMBINDEX, TROPONINI in the last 168 hours. BNP (last 3 results) No results for input(s): PROBNP in the last 8760 hours. HbA1C: No results for input(s): HGBA1C in the last 72 hours. CBG: No results for input(s): GLUCAP in the last 168 hours. Lipid Profile: No results for input(s): CHOL, HDL, LDLCALC, TRIG, CHOLHDL, LDLDIRECT in the last 72 hours. Thyroid Function Tests: No results for input(s): TSH, T4TOTAL, FREET4, T3FREE, THYROIDAB in the last 72 hours. Anemia Panel: No results for input(s): VITAMINB12, FOLATE, FERRITIN, TIBC, IRON, RETICCTPCT in the last 72 hours. Sepsis Labs: No results for input(s): PROCALCITON, LATICACIDVEN in the last 168 hours.  No results found for this or any previous visit (from the past 240 hour(s)).   Radiology Studies: Dg Abdomen 1 View  Result Date: 04/08/2018 CLINICAL  DATA:  Nasogastric tube placement. EXAM: ABDOMEN - 1 VIEW COMPARISON:  Abdomen and pelvis CT obtained earlier today. FINDINGS: Interval nasogastric tube with its tip in the distal stomach and side hole in the mid to distal stomach. Dilated small bowel loops, better seen on the recent CT. Excreted contrast in the urinary bladder. Extensive lumbar spine degenerative changes and mild scoliosis. Left proximal femur fixation hardware. IMPRESSION: 1. Nasogastric tube tip and side hole in the stomach. 2. Previously noted changes of small bowel obstruction. This may be due to an internal hernia, based on swirling of the mesentery in the right mid to upper abdomen on the CT. Electronically Signed   By: Claudie Revering M.D.   On: 04/08/2018 18:54   Ct Abdomen Pelvis W Contrast  Result Date: 04/08/2018 CLINICAL DATA:  Right upper quadrant abdomen pain since this morning. History of lymphoma. Patient has had prior surgery for obstruction in the past demonstrating lymphoma involvement of GI tract. EXAM: CT ABDOMEN AND PELVIS WITH CONTRAST TECHNIQUE: Multidetector CT imaging of the abdomen and pelvis was performed using the standard protocol following bolus administration of intravenous contrast. CONTRAST:  40mL OMNIPAQUE IOHEXOL 300 MG/ML  SOLN COMPARISON:  Mar 05, 2017 FINDINGS: Lower chest: Marked elevated right hemidiaphragm with dilated small bowel bowel loops in the mid and lower right hemithorax. The heart size is enlarged. There is atelectasis of the posterior lung bases. Hepatobiliary: No focal liver abnormality is seen. Status post cholecystectomy. Mild intrahepatic biliary ductal dilatation is identified, likely postsurgical. Pancreas: Unremarkable. No pancreatic ductal dilatation or surrounding inflammatory changes. Spleen: Normal in size without focal abnormality. Adrenals/Urinary Tract: Adrenal glands are unremarkable. Kidneys are normal, without focal lesion, or hydronephrosis. Small nonobstructing stone is  identified within the left kidney. Asymmetric right side and anterior bladder bowel wall thickening is noted. Stomach/Bowel: There are multiple dilated small bowel loops in the abdomen pelvis including small bowel loops extending in the right mid and lower thorax. There is no evidence of diverticulitis. Moderate bowel content is identified throughout colon. The stomach is stable. Vascular/Lymphatic: Aortic atherosclerosis. No enlarged abdominal or pelvic lymph nodes. Reproductive: Status post prior hysterectomy. Solid masslike area with associated calcification in the right adnexa question right ovary unchanged compared prior CT. Other: Ascites is noted in the abdomen and pelvis. Musculoskeletal: Degenerative joint changes of the spine are noted. Prior left hip surgery is noted. IMPRESSION: Multiple dilated small bowel loops in the abdomen and pelvis including small bowel is loops extending in the right mid and lower thorax due to mark elevated right hemidiaphragm. Findings are suspicious for  developing small bowel obstruction. Asymmetric right side and anterior bladder bowel wall thickening. Consider further evaluation with direct visualization. Ascites is noted in the abdomen and pelvis. Status post prior cholecystectomy and hysterectomy. Electronically Signed   By: Abelardo Diesel M.D.   On: 04/08/2018 16:26   Scheduled Meds: . enoxaparin (LOVENOX) injection  40 mg Subcutaneous Q24H  . fluticasone  2 spray Each Nare Daily  . loteprednol  1 drop Left Eye Daily   Continuous Infusions: . sodium chloride 100 mL/hr at 04/08/18 2156  . famotidine (PEPCID) IV Stopped (04/08/18 2226)    LOS: 1 day   Kerney Elbe, DO [facility] Pager [phone]  If 7PM-7AM, please contact night-coverage [email or web address] Password [facility] 04/09/2018, 8:33 AM

## 2018-04-09 NOTE — Progress Notes (Signed)
Passed pt on to nurse, Pt had an uneventful day.lm

## 2018-04-10 ENCOUNTER — Inpatient Hospital Stay (HOSPITAL_COMMUNITY): Payer: Medicare Other

## 2018-04-10 LAB — CBC WITH DIFFERENTIAL/PLATELET
Basophils Absolute: 0 10*3/uL (ref 0.0–0.1)
Basophils Relative: 0 %
EOS ABS: 0 10*3/uL (ref 0.0–0.7)
Eosinophils Relative: 0 %
HEMATOCRIT: 38.5 % (ref 36.0–46.0)
HEMOGLOBIN: 13.1 g/dL (ref 12.0–15.0)
LYMPHS ABS: 0.5 10*3/uL — AB (ref 0.7–4.0)
Lymphocytes Relative: 16 %
MCH: 29.3 pg (ref 26.0–34.0)
MCHC: 34 g/dL (ref 30.0–36.0)
MCV: 86.1 fL (ref 78.0–100.0)
Monocytes Absolute: 0.3 10*3/uL (ref 0.1–1.0)
Monocytes Relative: 10 %
NEUTROS ABS: 2.3 10*3/uL (ref 1.7–7.7)
NEUTROS PCT: 74 %
Platelets: 141 10*3/uL — ABNORMAL LOW (ref 150–400)
RBC: 4.47 MIL/uL (ref 3.87–5.11)
RDW: 12.6 % (ref 11.5–15.5)
WBC: 3.2 10*3/uL — AB (ref 4.0–10.5)

## 2018-04-10 LAB — COMPREHENSIVE METABOLIC PANEL
ALBUMIN: 3.9 g/dL (ref 3.5–5.0)
ALT: 15 U/L (ref 14–54)
AST: 27 U/L (ref 15–41)
Alkaline Phosphatase: 59 U/L (ref 38–126)
Anion gap: 13 (ref 5–15)
BUN: 21 mg/dL — ABNORMAL HIGH (ref 6–20)
CO2: 20 mmol/L — AB (ref 22–32)
CREATININE: 1.15 mg/dL — AB (ref 0.44–1.00)
Calcium: 9.5 mg/dL (ref 8.9–10.3)
Chloride: 105 mmol/L (ref 101–111)
GFR calc non Af Amer: 42 mL/min — ABNORMAL LOW (ref >60)
GFR, EST AFRICAN AMERICAN: 48 mL/min — AB (ref >60)
GLUCOSE: 79 mg/dL (ref 65–99)
Potassium: 4.4 mmol/L (ref 3.5–5.1)
SODIUM: 138 mmol/L (ref 135–145)
Total Bilirubin: 1 mg/dL (ref 0.3–1.2)
Total Protein: 7.1 g/dL (ref 6.5–8.1)

## 2018-04-10 LAB — PHOSPHORUS: Phosphorus: 3.9 mg/dL (ref 2.5–4.6)

## 2018-04-10 LAB — MAGNESIUM: Magnesium: 1.9 mg/dL (ref 1.7–2.4)

## 2018-04-10 MED ORDER — HYDRALAZINE HCL 20 MG/ML IJ SOLN
10.0000 mg | Freq: Four times a day (QID) | INTRAMUSCULAR | Status: DC | PRN
  Administered 2018-04-10: 10 mg via INTRAVENOUS
  Filled 2018-04-10: qty 1

## 2018-04-10 MED ORDER — DIATRIZOATE MEGLUMINE & SODIUM 66-10 % PO SOLN
90.0000 mL | Freq: Once | ORAL | Status: AC
  Administered 2018-04-10: 90 mL via NASOGASTRIC
  Filled 2018-04-10: qty 90

## 2018-04-10 MED ORDER — SODIUM CHLORIDE 0.9 % IV BOLUS
500.0000 mL | Freq: Once | INTRAVENOUS | Status: AC
  Administered 2018-04-10: 500 mL via INTRAVENOUS

## 2018-04-10 NOTE — Progress Notes (Signed)
Pt NGT re-inserted @ 0915 tol proc well. Placement verified via Xray. Pt noted with golden/green gastric content, pt totaled @ 1300-- 776ml gastric content.   Gastrografin instilled @ 1300, pt c/o of increase abd pain, medicated for pain as ordered, 1345 BP increased, medicated as noted, BP rechecked, BP 88/54, MD made aware, bolus ordered. Continued to monitor, family aware and at bedside with pt. Pt BP stabilized after bolus BP 143/74. Pt resting, without discomfort. NGT patent, total volume for the shift 1400 ml. MD updated. Pt without distress, will update oncoming shift. SRP, RN

## 2018-04-10 NOTE — Progress Notes (Signed)
Subjective/Chief Complaint: Complains of intermittent abdominal discomfort. She also pulled IV out   Objective: Vital signs in last 24 hours: Temp:  [97.9 F (36.6 C)-98.6 F (37 C)] 97.9 F (36.6 C) (06/09 0456) Pulse Rate:  [83-98] 85 (06/09 0456) Resp:  [16] 16 (06/09 0456) BP: (140-172)/(78-98) 172/78 (06/09 0456) SpO2:  [96 %-98 %] 97 % (06/09 0456) Last BM Date: 04/06/18  Intake/Output from previous day: 06/08 0701 - 06/09 0700 In: 2856.7 [I.V.:2756.7; IV Piggyback:100] Out: -  Intake/Output this shift: No intake/output data recorded.  General appearance: alert and cooperative Resp: clear to auscultation bilaterally Cardio: regular rate and rhythm GI: soft, nontender. not very distended  Lab Results:  Recent Labs    04/09/18 0440 04/10/18 0358  WBC 3.4* 3.2*  HGB 12.5 13.1  HCT 37.2 38.5  PLT 129* 141*   BMET Recent Labs    04/09/18 0440 04/10/18 0358  NA 136 138  K 4.4 4.4  CL 100* 105  CO2 25 20*  GLUCOSE 108* 79  BUN 19 21*  CREATININE 1.20* 1.15*  CALCIUM 9.8 9.5   PT/INR No results for input(s): LABPROT, INR in the last 72 hours. ABG No results for input(s): PHART, HCO3 in the last 72 hours.  Invalid input(s): PCO2, PO2  Studies/Results: Dg Abdomen 1 View  Result Date: 04/08/2018 CLINICAL DATA:  Nasogastric tube placement. EXAM: ABDOMEN - 1 VIEW COMPARISON:  Abdomen and pelvis CT obtained earlier today. FINDINGS: Interval nasogastric tube with its tip in the distal stomach and side hole in the mid to distal stomach. Dilated small bowel loops, better seen on the recent CT. Excreted contrast in the urinary bladder. Extensive lumbar spine degenerative changes and mild scoliosis. Left proximal femur fixation hardware. IMPRESSION: 1. Nasogastric tube tip and side hole in the stomach. 2. Previously noted changes of small bowel obstruction. This may be due to an internal hernia, based on swirling of the mesentery in the right mid to upper abdomen  on the CT. Electronically Signed   By: Claudie Revering M.D.   On: 04/08/2018 18:54   Ct Abdomen Pelvis W Contrast  Result Date: 04/08/2018 CLINICAL DATA:  Right upper quadrant abdomen pain since this morning. History of lymphoma. Patient has had prior surgery for obstruction in the past demonstrating lymphoma involvement of GI tract. EXAM: CT ABDOMEN AND PELVIS WITH CONTRAST TECHNIQUE: Multidetector CT imaging of the abdomen and pelvis was performed using the standard protocol following bolus administration of intravenous contrast. CONTRAST:  42mL OMNIPAQUE IOHEXOL 300 MG/ML  SOLN COMPARISON:  Mar 05, 2017 FINDINGS: Lower chest: Marked elevated right hemidiaphragm with dilated small bowel bowel loops in the mid and lower right hemithorax. The heart size is enlarged. There is atelectasis of the posterior lung bases. Hepatobiliary: No focal liver abnormality is seen. Status post cholecystectomy. Mild intrahepatic biliary ductal dilatation is identified, likely postsurgical. Pancreas: Unremarkable. No pancreatic ductal dilatation or surrounding inflammatory changes. Spleen: Normal in size without focal abnormality. Adrenals/Urinary Tract: Adrenal glands are unremarkable. Kidneys are normal, without focal lesion, or hydronephrosis. Small nonobstructing stone is identified within the left kidney. Asymmetric right side and anterior bladder bowel wall thickening is noted. Stomach/Bowel: There are multiple dilated small bowel loops in the abdomen pelvis including small bowel loops extending in the right mid and lower thorax. There is no evidence of diverticulitis. Moderate bowel content is identified throughout colon. The stomach is stable. Vascular/Lymphatic: Aortic atherosclerosis. No enlarged abdominal or pelvic lymph nodes. Reproductive: Status post prior hysterectomy. Solid masslike  area with associated calcification in the right adnexa question right ovary unchanged compared prior CT. Other: Ascites is noted in the  abdomen and pelvis. Musculoskeletal: Degenerative joint changes of the spine are noted. Prior left hip surgery is noted. IMPRESSION: Multiple dilated small bowel loops in the abdomen and pelvis including small bowel is loops extending in the right mid and lower thorax due to mark elevated right hemidiaphragm. Findings are suspicious for developing small bowel obstruction. Asymmetric right side and anterior bladder bowel wall thickening. Consider further evaluation with direct visualization. Ascites is noted in the abdomen and pelvis. Status post prior cholecystectomy and hysterectomy. Electronically Signed   By: Abelardo Diesel M.D.   On: 04/08/2018 16:26   Dg Abd Portable 1v-small Bowel Obstruction Protocol-initial, 8 Hr Delay  Result Date: 04/09/2018 CLINICAL DATA:  84 year old with small bowel obstruction. EXAM: PORTABLE ABDOMEN - 1 VIEW COMPARISON:  CT and abdominal radiograph from 04/08/2018 FINDINGS: There continues to be dilated loops of small bowel, particularly in the right mid and right upper abdomen. There is contrast in the urinary bladder from recent CT examination. Persistent elevation of the right hemidiaphragm. Cholecystectomy clips are present. Surgical screw in the left femoral head. Scoliosis and degenerative changes in lumbar spine. Limited evaluation for free air on this portable supine view. IMPRESSION: Persistent dilated loops of small bowel with minimal change from the recent comparison examination. Findings remain compatible with a bowel obstruction. Electronically Signed   By: Markus Daft M.D.   On: 04/09/2018 09:02    Anti-infectives: Anti-infectives (From admission, onward)   Start     Dose/Rate Route Frequency Ordered Stop   04/08/18 1715  vancomycin (VANCOCIN) 1,250 mg in sodium chloride 0.9 % 250 mL IVPB  Status:  Discontinued     1,250 mg 166.7 mL/hr over 90 Minutes Intravenous  Once 04/08/18 1711 04/08/18 1732   04/08/18 1715  cefTRIAXone (ROCEPHIN) 1 g in sodium chloride  0.9 % 100 mL IVPB  Status:  Discontinued     1 g 200 mL/hr over 30 Minutes Intravenous  Once 04/08/18 1711 04/08/18 1732   04/08/18 1715  ampicillin (OMNIPEN) 1 g in sodium chloride 0.9 % 100 mL IVPB  Status:  Discontinued     1 g 300 mL/hr over 20 Minutes Intravenous  Once 04/08/18 1711 04/08/18 1728      Assessment/Plan: s/p * No surgery found * abd xray unchanged. Since she vomited overnight I would recommend replacing the ng tube and starting the small bowel protocol for sbo  LOS: 2 days    TOTH III,Coley Littles S 04/10/2018

## 2018-04-10 NOTE — Progress Notes (Signed)
PROGRESS NOTE    Erika Day  AXK:[id number] DOB: January 12, 1930 DOA: 04/08/2018 PCP: Mack Hook, MD   Brief Narrative:  84 year old female with a history of marginal zone lymphoma, MGUS,diabetes, hypertension,previous history of sigmoid colectomy due to second recurrence of sigmoid volvulus in 2017,who presented to the ER today with chief complaint of right upper quadrant abdominal pain starting at 3 AM this morning associated with an episode of vomiting and diarrhea. Patient describes a feeling of being constipated. She complained of diffuse abdominal pain. No chest pain or shortness of breath , no fever chills, rigors. Last BM was yesterday at 11:00 am . Unable to belch or pass flatus. CT abdomen pelvis shows multiple dilated small bowel loops in the abdomen and pelvis, suspicious for developing small bowel obstruction. Surgery was consulted and NGT Placed and patient undewent Small Bowel Protocol but unfortunately removed NGT overnight of 6/7-6/8. Because she worsened with pain and distention she had her NGT replaced by General Surgery and is undergoing Small Bowel Protocol today with Gastrograffin.  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/2016   Volvulus of sigmoid colon (HCC)   MGUS (monoclonal gammopathy of unknown significance)  Acute Small Bowel Obstruction (HCC) -Has Extensive prior surgical history,  including history of recurrent sigmoid volvulus, status post sigmoid colectomy -Now presenting with recurrent small bowel obstruction -NG tube was placed but unfortunately patient pulled it out; Now is being replaced because of worsening pain and distention  -General Surgery Consulted and placed patient on Small Bowel  Protocol with Gastrografin again today after NGT was placed  -NS at 100 mL/hr reduced to 75 mL/hr  -Supportive Care and pain control with IV fentanyl 12.5 to 25 mcg every 2 hours PRN for moderate and severe pain -NPO and continue Bowel Rest -Ambulate and OOB -Maintain K+ >4.0 and Mag Level >2.0 -KUB this AM showed Mildly to moderately dilated small bowel loops throughout the abdomen, mildly improved, suggesting improving small bowel obstruction. 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 / CLL/ MGUS -Patient is followed by  Dr.Gorsuch -This problem appears to be stable  CKD Stage 3 -Stable and improving slightly with IVF Rehydration  -BUN/Cr went from 20/1.23 -> 19/1.20 -> 21/1.15 -Avoid Nephrotoxic Medications if possible and hold Lisinopril -Repeat CMP in AM   GERD -Continue Famotidine IV 20 mg q12h  DVT prophylaxis: Enoxaparin 40 mg sq q24h Code Status: FULL CODE Family Communication: No family present at bedside  Disposition Plan: Remain Inpatient for continued workup and Treatment   Consultants:   General Surgery    Procedures: None   Antimicrobials:  Anti-infectives (From admission, onward)   Start     Dose/Rate Route Frequency Ordered Stop   04/08/18 1715  vancomycin (VANCOCIN) 1,250 mg in sodium chloride 0.9 % 250 mL IVPB  Status:  Discontinued     1,250 mg 166.7 mL/hr over 90 Minutes Intravenous  Once 04/08/18 1711 04/08/18 1732   04/08/18 1715  cefTRIAXone (ROCEPHIN) 1 g in sodium chloride 0.9 % 100 mL IVPB  Status:  Discontinued       1 g 200 mL/hr over 30 Minutes Intravenous  Once 04/08/18 1711 04/08/18 1732   04/08/18 1715  ampicillin (OMNIPEN) 1 g in sodium chloride 0.9 % 100 mL IVPB  Status:  Discontinued     1 g 300 mL/hr over 20 Minutes Intravenous  Once 04/08/18 1711 04/08/18 1728     Subjective: Seen and examined this AM and stated her Abdominal Pain improved after insertion of NGT and suction. No CP or SOB. Denied any Nausea at this time.  Objective: Vitals:   04/09/18 1342 04/09/18 2211 04/10/18 0456 04/10/18 1301  BP: 140/82 (!) 170/98 (!) 172/78 (!) 170/96  Pulse: 83 98 85 91  Resp: 16 16 16 20   Temp: 98.6 F (37 C) 98 F (36.7 C) 97.9 F (36.6 C) 97.7 F (36.5 C)  TempSrc: Oral Oral Oral Oral  SpO2: 98% 96% 97% 98%  Weight:      Height:        Intake/Output Summary (Last 24 hours) at 04/10/2018 1607 Last data filed at 04/10/2018 1557 Gross per 24 hour  Intake 2856.67 ml  Output -  Net 2856.67 ml   Filed Weights   04/08/18 1249 04/08/18 2000  Weight: 63 kg (139 lb) 72 kg (158 lb 11.7 oz)   Examination: Physical Exam:  Constitutional: Well-nourished, well-developed elderly African American female who is currently in no acute distress and appears calm Eyes: Sclera anicteric.  Lids and conjunctive normal ENMT: External ears and nose appear normal.  Has NG tube in place. Neck: Supple no JVD Respiratory: Diminished to auscultation bilaterally with no appreciable wheezing, rales, rhonchi.  Patient not tachypneic using his muscle breathe Cardiovascular: Regular rate and rhythm.  Has a 2 out of 6 systolic murmur appreciated the left sternal border.  No appreciable lower extremity edema Abdomen: Soft minimally tender to palpate.  Distention is slightly improved.  Bowel sounds are slightly hyperactive and high-pitched GU: Deferred Musculoskeletal: No contractures or cyanosis noted.  No joint deformities in upper extremities Skin: Skin is warm and dry with no appreciable rash or lesions on limited  skin evaluation Neurologic: Cranial nerves II through XII grossly intact no appreciable focal deficits Psychiatric: Normal mood and affect.  Intact judgment and insight.  Patient is awake and alert and oriented x3  Data Reviewed: I have personally reviewed following labs and imaging studies  CBC: Recent Labs  Lab 04/08/18 1430 04/09/18 0440 04/10/18 0358  WBC 4.2 3.4* 3.2*  NEUTROABS 3.0  --  2.3  HGB 12.2 12.5 13.1  HCT 36.4 37.2 38.5  MCV 87.9 88.2 86.1  PLT 161 129* 295*   Basic Metabolic Panel: Recent Labs  Lab 04/08/18 1430 04/09/18 0440 04/10/18 0358  NA 132* 136 138  K 4.3 4.4 4.4  CL 97* 100* 105  CO2 26 25 20*  GLUCOSE 119* 108* 79  BUN 20 19 21*  CREATININE 1.23* 1.20* 1.15*  CALCIUM 9.8 9.8 9.5  MG 2.0 1.8 1.9  PHOS  --  3.8 3.9   GFR: Estimated Creatinine Clearance: 34.1 mL/min (A) (by C-G formula based on SCr of 1.15 mg/dL (H)). Liver Function Tests: Recent Labs  Lab 04/08/18 1430 04/09/18 0440 04/10/18 0358  AST 20 22 27   ALT 11* 11* 15  ALKPHOS 62 57 59  BILITOT 0.7 0.6 1.0  PROT 7.4 7.2 7.1  ALBUMIN 4.2 4.0 3.9   Recent Labs  Lab 04/08/18 1430  LIPASE 47   No results for input(s): AMMONIA in the last 168 hours. Coagulation Profile: No results for input(s): INR, PROTIME in the last 168 hours. Cardiac Enzymes: No results for input(s): CKTOTAL, CKMB, CKMBINDEX, TROPONINI in the last 168 hours. BNP (last 3 results) No results for input(s): PROBNP in the last 8760 hours. HbA1C: No results for input(s): HGBA1C in the last 72 hours. CBG: No results for input(s): GLUCAP in the last 168 hours. Lipid Profile: No results for input(s): CHOL, HDL, LDLCALC, TRIG, CHOLHDL, LDLDIRECT in the last 72 hours. Thyroid Function Tests: No results for input(s): TSH, T4TOTAL, FREET4, T3FREE, THYROIDAB in the last 72 hours. Anemia Panel: No results for input(s): VITAMINB12, FOLATE, FERRITIN, TIBC, IRON, RETICCTPCT in the last 72 hours. Sepsis Labs: No  results for input(s): PROCALCITON, LATICACIDVEN in the last 168 hours.  No results found for this or any previous visit (from the past 240 hour(s)).   Radiology Studies: Dg Abd 1 View  Result Date: 04/10/2018 CLINICAL DATA:  Small bowel obstruction EXAM: ABDOMEN - 1 VIEW COMPARISON:  04/09/2018 abdominal radiograph FINDINGS: Mildly to moderately dilated small bowel loops throughout the abdomen, mildly improved. Moderate colonic stool volume. No evidence of pneumatosis or pneumoperitoneum. No radiopaque nephrolithiasis. Decreased excreted contrast in the bladder. Marked lumbar spondylosis. Partially visualized left femoral neck pin. Surgical sutures overlie the left deep pelvis. IMPRESSION: Mildly to moderately dilated small bowel loops throughout the abdomen, mildly improved, suggesting improving small bowel obstruction. Electronically Signed   By: Ilona Sorrel M.D.   On: 04/10/2018 08:03   Dg Abdomen 1 View  Result Date: 04/08/2018 CLINICAL DATA:  Nasogastric tube placement. EXAM: ABDOMEN - 1 VIEW COMPARISON:  Abdomen and pelvis CT obtained earlier today. FINDINGS: Interval nasogastric tube with its tip in the distal stomach and side hole in the mid to distal stomach. Dilated small bowel loops, better seen on the recent CT. Excreted contrast in the urinary bladder. Extensive lumbar spine degenerative changes and mild scoliosis. Left proximal femur fixation hardware. IMPRESSION: 1. Nasogastric tube tip and side hole in the stomach. 2. Previously noted changes of small bowel obstruction. This may be due to an internal hernia, based on swirling of the mesentery in the right mid to upper abdomen on the CT. Electronically Signed   By: Claudie Revering M.D.   On: 04/08/2018 18:54   Ct Abdomen Pelvis W Contrast  Result Date: 04/08/2018 CLINICAL DATA:  Right upper quadrant abdomen pain since this morning. History of lymphoma. Patient has had prior surgery for obstruction in the past demonstrating lymphoma  involvement of GI tract. EXAM: CT ABDOMEN AND PELVIS WITH CONTRAST TECHNIQUE: Multidetector CT imaging of the abdomen and pelvis was performed using the standard protocol following bolus administration of intravenous contrast. CONTRAST:  36mL OMNIPAQUE IOHEXOL 300 MG/ML  SOLN COMPARISON:  Mar 05, 2017 FINDINGS: Lower chest: Marked elevated right hemidiaphragm with dilated small bowel bowel loops in the mid and lower right hemithorax. The heart size is enlarged. There is atelectasis of the posterior lung bases. Hepatobiliary: No focal liver abnormality is seen. Status post cholecystectomy. Mild intrahepatic biliary ductal dilatation is identified, likely postsurgical. Pancreas: Unremarkable. No pancreatic ductal dilatation or surrounding inflammatory changes. Spleen: Normal in size without focal abnormality. Adrenals/Urinary Tract: Adrenal glands are unremarkable. Kidneys are normal, without focal lesion, or hydronephrosis. Small nonobstructing stone  is identified within the left kidney. Asymmetric right side and anterior bladder bowel wall thickening is noted. Stomach/Bowel: There are multiple dilated small bowel loops in the abdomen pelvis including small bowel loops extending in the right mid and lower thorax. There is no evidence of diverticulitis. Moderate bowel content is identified throughout colon. The stomach is stable. Vascular/Lymphatic: Aortic atherosclerosis. No enlarged abdominal or pelvic lymph nodes. Reproductive: Status post prior hysterectomy. Solid masslike area with associated calcification in the right adnexa question right ovary unchanged compared prior CT. Other: Ascites is noted in the abdomen and pelvis. Musculoskeletal: Degenerative joint changes of the spine are noted. Prior left hip surgery is noted. IMPRESSION: Multiple dilated small bowel loops in the abdomen and pelvis including small bowel is loops extending in the right mid and lower thorax due to mark elevated right hemidiaphragm.  Findings are suspicious for developing small bowel obstruction. Asymmetric right side and anterior bladder bowel wall thickening. Consider further evaluation with direct visualization. Ascites is noted in the abdomen and pelvis. Status post prior cholecystectomy and hysterectomy. Electronically Signed   By: Abelardo Diesel M.D.   On: 04/08/2018 16:26   Dg Abd Portable 1v-small Bowel Protocol-position Verification  Result Date: 04/10/2018 CLINICAL DATA:  Nasogastric tube placement. Small bowel obstruction. EXAM: PORTABLE ABDOMEN - 1 VIEW COMPARISON:  04/10/2018 FINDINGS: There has been placement of a nasogastric tube with tip overlying the mid body of the stomach. Several dilated small bowel loops are again seen in the right upper quadrant, with elevation of right hemidiaphragm. IMPRESSION: Nasogastric tube tip overlies the mid gastric body. Electronically Signed   By: Earle Gell M.D.   On: 04/10/2018 11:38   Dg Abd Portable 1v-small Bowel Obstruction Protocol-initial, 8 Hr Delay  Result Date: 04/09/2018 CLINICAL DATA:  84 year old with small bowel obstruction. EXAM: PORTABLE ABDOMEN - 1 VIEW COMPARISON:  CT and abdominal radiograph from 04/08/2018 FINDINGS: There continues to be dilated loops of small bowel, particularly in the right mid and right upper abdomen. There is contrast in the urinary bladder from recent CT examination. Persistent elevation of the right hemidiaphragm. Cholecystectomy clips are present. Surgical screw in the left femoral head. Scoliosis and degenerative changes in lumbar spine. Limited evaluation for free air on this portable supine view. IMPRESSION: Persistent dilated loops of small bowel with minimal change from the recent comparison examination. Findings remain compatible with a bowel obstruction. Electronically Signed   By: Markus Daft M.D.   On: 04/09/2018 09:02   Scheduled Meds: . enoxaparin (LOVENOX) injection  40 mg Subcutaneous Q24H  . fluticasone  2 spray Each Nare Daily    . loteprednol  1 drop Left Eye Daily   Continuous Infusions: . sodium chloride 100 mL/hr at 04/10/18 0509  . famotidine (PEPCID) IV Stopped (04/10/18 1200)    LOS: 2 days   Kerney Elbe, DO [facility] Pager [phone]  If 7PM-7AM, please contact night-coverage [email or web address] Password [email or web address] 04/10/2018, 4:07 PM

## 2018-04-11 ENCOUNTER — Inpatient Hospital Stay (HOSPITAL_COMMUNITY): Payer: Medicare Other

## 2018-04-11 DIAGNOSIS — D649 Anemia, unspecified: Secondary | ICD-10-CM

## 2018-04-11 DIAGNOSIS — D72819 Decreased white blood cell count, unspecified: Secondary | ICD-10-CM

## 2018-04-11 LAB — CBC WITH DIFFERENTIAL/PLATELET
BASOS ABS: 0 10*3/uL (ref 0.0–0.1)
BASOS PCT: 0 %
Eosinophils Absolute: 0 10*3/uL (ref 0.0–0.7)
Eosinophils Relative: 0 %
HEMATOCRIT: 35.9 % — AB (ref 36.0–46.0)
Hemoglobin: 11.8 g/dL — ABNORMAL LOW (ref 12.0–15.0)
LYMPHS PCT: 17 %
Lymphs Abs: 0.6 10*3/uL — ABNORMAL LOW (ref 0.7–4.0)
MCH: 29 pg (ref 26.0–34.0)
MCHC: 32.9 g/dL (ref 30.0–36.0)
MCV: 88.2 fL (ref 78.0–100.0)
Monocytes Absolute: 0.4 10*3/uL (ref 0.1–1.0)
Monocytes Relative: 11 %
NEUTROS ABS: 2.6 10*3/uL (ref 1.7–7.7)
NEUTROS PCT: 72 %
Platelets: 168 10*3/uL (ref 150–400)
RBC: 4.07 MIL/uL (ref 3.87–5.11)
RDW: 12.9 % (ref 11.5–15.5)
WBC: 3.6 10*3/uL — AB (ref 4.0–10.5)

## 2018-04-11 LAB — COMPREHENSIVE METABOLIC PANEL
ALT: 14 U/L (ref 14–54)
AST: 25 U/L (ref 15–41)
Albumin: 3.7 g/dL (ref 3.5–5.0)
Alkaline Phosphatase: 53 U/L (ref 38–126)
Anion gap: 11 (ref 5–15)
BILIRUBIN TOTAL: 0.7 mg/dL (ref 0.3–1.2)
BUN: 22 mg/dL — AB (ref 6–20)
CO2: 21 mmol/L — ABNORMAL LOW (ref 22–32)
CREATININE: 1.13 mg/dL — AB (ref 0.44–1.00)
Calcium: 9.4 mg/dL (ref 8.9–10.3)
Chloride: 109 mmol/L (ref 101–111)
GFR calc Af Amer: 50 mL/min — ABNORMAL LOW (ref >60)
GFR calc non Af Amer: 43 mL/min — ABNORMAL LOW (ref >60)
Glucose, Bld: 68 mg/dL (ref 65–99)
POTASSIUM: 4.1 mmol/L (ref 3.5–5.1)
Sodium: 141 mmol/L (ref 135–145)
TOTAL PROTEIN: 6.7 g/dL (ref 6.5–8.1)

## 2018-04-11 LAB — PHOSPHORUS: Phosphorus: 3.3 mg/dL (ref 2.5–4.6)

## 2018-04-11 LAB — MAGNESIUM: Magnesium: 2 mg/dL (ref 1.7–2.4)

## 2018-04-11 MED ORDER — FENTANYL CITRATE (PF) 100 MCG/2ML IJ SOLN
25.0000 ug | INTRAMUSCULAR | Status: DC | PRN
  Administered 2018-04-11 – 2018-04-12 (×3): 50 ug via INTRAVENOUS
  Administered 2018-04-12: 25 ug via INTRAVENOUS
  Administered 2018-04-12: 50 ug via INTRAVENOUS
  Administered 2018-04-12 – 2018-04-14 (×6): 25 ug via INTRAVENOUS
  Administered 2018-04-14 – 2018-04-15 (×2): 50 ug via INTRAVENOUS
  Administered 2018-04-15 (×2): 25 ug via INTRAVENOUS
  Administered 2018-04-15 – 2018-04-16 (×4): 50 ug via INTRAVENOUS
  Filled 2018-04-11 (×19): qty 2

## 2018-04-11 MED ORDER — LIP MEDEX EX OINT
TOPICAL_OINTMENT | CUTANEOUS | Status: AC
  Administered 2018-04-11: 1
  Filled 2018-04-11: qty 7

## 2018-04-11 MED ORDER — BISACODYL 10 MG RE SUPP
10.0000 mg | Freq: Once | RECTAL | Status: AC
  Administered 2018-04-11: 10 mg via RECTAL
  Filled 2018-04-11: qty 1

## 2018-04-11 MED ORDER — BISACODYL 10 MG RE SUPP: 10.0000 mg | Freq: Once | RECTAL | Status: DC | PRN

## 2018-04-11 NOTE — Care Management Important Message (Signed)
Important Message  Patient Details  Name: Erika Day MRN: [id number] Date of Birth: [birth year]   Medicare Important Message Given:  Yes    Kerin Salen 04/11/2018, 1:27 [place] Message  Patient Details  Name: Erika Day MRN: [id number] Date of Birth: 1930/04/07   Medicare Important Message Given:  Yes    Kerin Salen 04/11/2018, 1:27 PM

## 2018-04-11 NOTE — Progress Notes (Signed)
PROGRESS NOTE    Erika Day  KZS:[id number] DOB: 1929-12-21 DOA: 04/08/2018 PCP: Mack Hook, MD   Brief Narrative:  84 year old female with a history of marginal zone lymphoma, MGUS,diabetes, hypertension,previous history of sigmoid colectomy due to second recurrence of sigmoid volvulus in 2017,who presented to the ER today with chief complaint of right upper quadrant abdominal pain starting at 3 AM this morning associated with an episode of vomiting and diarrhea. Patient describes a feeling of being constipated. She complained of diffuse abdominal pain. No chest pain or shortness of breath , no fever chills, rigors. Last BM was yesterday at 11:00 am . Unable to belch or pass flatus. CT abdomen pelvis shows multiple dilated small bowel loops in the abdomen and pelvis, suspicious for developing small bowel obstruction. Surgery was consulted and NGT Placed and patient undewent Small Bowel Protocol but unfortunately removed NGT overnight of 6/7-6/8. Because she worsened with pain and distention she had her NGT replaced by General Surgery and underwent Small Bowel Protocol yesterday with Gastrograffin. Still complaining of significant Abdominal Pain but Abdominal X-Ray improving so Surgery recommending continuing NGT to [facility] today.   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)   Sigmoid volvulus s/p lap sigmoid colectomy 06/30/2016   Volvulus of sigmoid colon (HCC)   MGUS (monoclonal gammopathy of unknown significance)  Acute Small Bowel Obstruction (HCC) -Has Extensive prior surgical history,  including history of recurrent sigmoid volvulus, status post sigmoid colectomy -Now presenting with recurrent small bowel obstruction -NG tube was placed but unfortunately patient pulled it  out; Now is being replaced because of worsening pain and distention  -General Surgery Consulted and placed patient on Small Bowel Protocol with Gastrografin again today after NGT was placed  -NS at 100 mL/hr reduced to 75 mL/hr and will continue today given NPO Status -Supportive Care and pain control with IV fentanyl 12.5 to 25 mcg every 2 hours PRN for moderate and severe pain increased to 25-50 mcg q2h -NPO and continue Bowel Rest -Ambulate and OOB -Maintain K+ >4.0 and Mag Level >2.0 -KUB this AM showed Improved bowel gas pattern. No visible residual dilated small bowel loops -Repeat KUB in AM   -General Surgery possibly going to give suppository or enema for Colonic stool burden noted on KUB  Essential Hypertension -Hold Home Lisinopril due to NPO Status  -BP was 162/83 this AM  -Hydralazine IV 10 mg every 6 PRN for systolic blood pressure greater than 732 or diastolic blood pressure greater than 100  Osteoarthritis -Patient is on when necessary Robaxin at home -Can continue Acetaminophen 650 mg po/RC    Thrombocytopenia, improved  -Patient's Platelet Count was 129 and improved to 168 -Continue to Monitor and repeat CBC in AM    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  Normocytic Anemia  -Patient's hemoglobin/hematocrit went from 13.1/38.5 and is now  11.8/35.9 -Likely dilutional drop from IV fluid resuscitation from normal saline -Has a history of chronic kidney disease stage III and possibly from that -Continue to monitor for signs and symptoms of  bleeding -Repeat CBC in a.m.  DVT prophylaxis: Enoxaparin 40 mg sq q24h Code Status: FULL CODE Family Communication: No family present at bedside  Disposition Plan: Remain Inpatient for continued workup and Treatment and Anticipate D/C in next 24-48 hours if improved   Consultants:   General Surgery    Procedures: None   Antimicrobials:  Anti-infectives (From admission, onward)   Start     Dose/Rate Route Frequency Ordered Stop   04/08/18 1715  vancomycin (VANCOCIN) 1,250 mg in sodium chloride 0.9 % 250 mL IVPB  Status:  Discontinued     1,250 mg 166.7 mL/hr over 90 Minutes Intravenous  Once 04/08/18 1711 04/08/18 1732   04/08/18 1715  cefTRIAXone (ROCEPHIN) 1 g in sodium chloride 0.9 % 100 mL IVPB  Status:  Discontinued     1 g 200 mL/hr over 30 Minutes Intravenous  Once 04/08/18 1711 04/08/18 1732   04/08/18 1715  ampicillin (OMNIPEN) 1 g in sodium chloride 0.9 % 100 mL IVPB  Status:  Discontinued     1 g 300 mL/hr over 20 Minutes Intravenous  Once 04/08/18 1711 04/08/18 1728     Subjective: Seen and examined this AM he was complaining of significant abdominal pain.  Had a lot of output from her NG tube into the canister.  No chest pain, shortness breath, nausea, vomiting.  Only complained of abdominal pain.  No other complaints or concerns at this time.  Objective: Vitals:   04/10/18 1822 04/10/18 2029 04/11/18 0617 04/11/18 1215  BP: (!) 143/74 (!) 146/85 (!) 156/78 (!) 162/83  Pulse: 91 96 91 90  Resp:  16 14 17   Temp:  97.9 F (36.6 C) 97.6 F (36.4 C) 97.7 F (36.5 C)  TempSrc:  Axillary Oral Oral  SpO2:  98% 98% 98%  Weight:      Height:        Intake/Output Summary (Last 24 hours) at 04/11/2018 1250 Last data filed at 04/11/2018 0347 Gross per 24 hour  Intake 1601 ml  Output 1750 ml  Net -149 ml   Filed Weights   04/08/18 1249 04/08/18 2000  Weight: 63 kg (139 lb) 72 kg (158 lb 11.7 oz)   Examination: Physical Exam:  Constitutional:  Well-nourished, well-developed elderly African-American female who is currently complaining of abdominal pain and appears uncomfortable Eyes: Sclera anicteric.  Lids and conjunctive are normal ENMT: External ears and nose appear normal.  NG tube in place hooked to low intermittent wall suction Neck: Appears supple with no JVD Respiratory: Diminished to auscultation bilaterally no wheezing, rales, rhonchi.  Patient was not tachypneic using accessory muscle breathe Cardiovascular: Regular rate and rhythm.  Has a 2 out of 6 systolic murmur.  Trace lower extremity edema Abdomen: Tender to palpate diffusely.  Distention is improved.  Bowel sounds are present however are slightly high-pitched GU: Deferred soft, Musculoskeletal: No contractures cyanosis noted.  No joint deformities. Skin: Skin is warm and dry no appreciable rashes lesions on limited skin evaluation Neurologic: Cranial nerves II through XII grossly intact no appreciable focal deficits Psychiatric: Anxious mood and affect.  Intact judgment intact.  Patient is awake, alert, and oriented x3  Data Reviewed: I have personally reviewed following labs and imaging studies  CBC: Recent Labs  Lab 04/08/18 1430 04/09/18 0440 04/10/18 0358 04/11/18 0435  WBC 4.2 3.4* 3.2* 3.6*  NEUTROABS 3.0  --  2.3 2.6  HGB 12.2 12.5 13.1 11.8*  HCT 36.4 37.2 38.5 35.9*  MCV 87.9 88.2 86.1 88.2  PLT 161 129* 141* 948   Basic Metabolic Panel: Recent Labs  Lab 04/08/18 1430 04/09/18 0440 04/10/18 0358 04/11/18 0435  NA 132* 136 138 141  K 4.3 4.4 4.4 4.1  CL 97* 100* 105 109  CO2 26 25 20* 21*  GLUCOSE 119* 108* 79 68  BUN 20 19 21* 22*  CREATININE 1.23* 1.20* 1.15* 1.13*  CALCIUM 9.8 9.8 9.5 9.4  MG 2.0 1.8 1.9 2.0  PHOS  --  3.8 3.9 3.3   GFR: Estimated Creatinine Clearance: 34.8 mL/min (A) (by C-G formula based on SCr of 1.13 mg/dL (H)). Liver Function Tests: Recent Labs  Lab 04/08/18 1430 04/09/18 0440 04/10/18 0358  04/11/18 0435  AST 20 22 27 25   ALT 11* 11* 15 14  ALKPHOS 62 57 59 53  BILITOT 0.7 0.6 1.0 0.7  PROT 7.4 7.2 7.1 6.7  ALBUMIN 4.2 4.0 3.9 3.7   Recent Labs  Lab 04/08/18 1430  LIPASE 47   No results for input(s): AMMONIA in the last 168 hours. Coagulation Profile: No results for input(s): INR, PROTIME in the last 168 hours. Cardiac Enzymes: No results for input(s): CKTOTAL, CKMB, CKMBINDEX, TROPONINI in the last 168 hours. BNP (last 3 results) No results for input(s): PROBNP in the last 8760 hours. HbA1C: No results for input(s): HGBA1C in the last 72 hours. CBG: No results for input(s): GLUCAP in the last 168 hours. Lipid Profile: No results for input(s): CHOL, HDL, LDLCALC, TRIG, CHOLHDL, LDLDIRECT in the last 72 hours. Thyroid Function Tests: No results for input(s): TSH, T4TOTAL, FREET4, T3FREE, THYROIDAB in the last 72 hours. Anemia Panel: No results for input(s): VITAMINB12, FOLATE, FERRITIN, TIBC, IRON, RETICCTPCT in the last 72 hours. Sepsis Labs: No results for input(s): PROCALCITON, LATICACIDVEN in the last 168 hours.  No results found for this or any previous visit (from the past 240 hour(s)).   Radiology Studies: Dg Abd 1 View  Result Date: 04/11/2018 CLINICAL DATA:  Small bowel obstruction. EXAM: ABDOMEN - 1 VIEW COMPARISON:  Radiographs dated 04/10/2018, 04/09/2018 and 04/08/2018 and CT scan dated 04/08/2018 FINDINGS: NG tube tip in the body of the stomach. Decreased air in the bowel since the prior study. No discrete dilated small bowel loops. No acute bone abnormality. IMPRESSION: Improved bowel gas pattern. No visible residual dilated small bowel loops. Electronically Signed   By: Lorriane Shire M.D.   On: 04/11/2018 08:47   Dg Abd 1 View  Result Date: 04/10/2018 CLINICAL DATA:  Small bowel obstruction EXAM: ABDOMEN - 1 VIEW COMPARISON:  04/09/2018 abdominal radiograph FINDINGS: Mildly to moderately dilated small bowel loops throughout the abdomen, mildly  improved. Moderate colonic stool volume. No evidence of pneumatosis or pneumoperitoneum. No radiopaque nephrolithiasis. Decreased excreted contrast in the bladder. Marked lumbar spondylosis. Partially visualized left femoral neck pin. Surgical sutures overlie the left deep pelvis. IMPRESSION: Mildly to moderately dilated small bowel loops throughout the abdomen, mildly improved, suggesting improving small bowel obstruction. Electronically Signed   By: Ilona Sorrel M.D.   On: 04/10/2018 08:03   Dg Abd Portable 1v-small Bowel Obstruction Protocol-initial, 8 Hr Delay  Result Date: 04/10/2018 CLINICAL DATA:  Small-bowel obstruction, 8 hour delay EXAM: PORTABLE ABDOMEN - 1 VIEW COMPARISON:  0920 hours and 0443 hours on the same day. FINDINGS: Gastric tip and side-port project over the  left hemiabdomen presumably in the expected location of the stomach. Dilated small bowel loops are proportion to large bowel persists within the mid abdomen. Stool is seen within the ascending and proximal transverse colon. Dextroconvex curvature of the lumbar spine with multilevel degenerative disc disease and endplate spurring consistent with lumbar spondylosis is stable. Sclerosis about the SI joints bilaterally. Partially imaged left femoral nail fixation. No acute osseous abnormality. IMPRESSION: Redemonstration of gaseous distention of small bowel out of proportion to large bowel loops. No significant change. Electronically Signed   By: Ashley Royalty M.D.   On: 04/10/2018 22:52   Dg Abd Portable 1v-small Bowel Protocol-position Verification  Result Date: 04/10/2018 CLINICAL DATA:  Nasogastric tube placement. Small bowel obstruction. EXAM: PORTABLE ABDOMEN - 1 VIEW COMPARISON:  04/10/2018 FINDINGS: There has been placement of a nasogastric tube with tip overlying the mid body of the stomach. Several dilated small bowel loops are again seen in the right upper quadrant, with elevation of right hemidiaphragm. IMPRESSION: Nasogastric  tube tip overlies the mid gastric body. Electronically Signed   By: Earle Gell M.D.   On: 04/10/2018 11:38   Scheduled Meds: . enoxaparin (LOVENOX) injection  40 mg Subcutaneous Q24H  . fluticasone  2 spray Each Nare Daily  . loteprednol  1 drop Left Eye Daily   Continuous Infusions: . sodium chloride 75 mL/hr at 04/10/18 1630  . famotidine (PEPCID) IV Stopped (04/11/18 1006)    LOS: 3 days   Kerney Elbe, DO [facility] Pager [phone]  If 7PM-7AM, please contact night-coverage [email or web address] Password [email or web address] 04/11/2018, 12:50 PM

## 2018-04-11 NOTE — Progress Notes (Signed)
[facility] Progress Note     Subjective: CC:  C/o upper abdominal pain, worse with movement or inspiration. States she is 84 years old and does not want surgery. Denies BM.  Objective: Vital signs in last 24 hours: Temp:  [97.6 F (36.4 C)-97.9 F (36.6 C)] 97.6 F (36.4 C) (06/10 0617) Pulse Rate:  [91-96] 91 (06/10 0617) Resp:  [14-22] 14 (06/10 0617) BP: (88-170)/(54-96) 156/78 (06/10 0617) SpO2:  [98 %] 98 % (06/10 0617) Last BM Date: 04/06/18  Intake/Output from previous day: 06/09 0701 - 06/10 0700 In: 1901 [P.O.:1; I.V.:1300; IV Piggyback:600] Out: 1750 [Emesis/NG output:1750] Intake/Output this shift: No intake/output data recorded.  PE: Gen:  Alert, NAD, pleasant Pulm:  Normal effort Abd: Soft, TTP epigastrium and LUQ without peritonitis  Skin: warm and dry, no rashes  Psych: A&Ox3   Lab Results:  Recent Labs    04/10/18 0358 04/11/18 0435  WBC 3.2* 3.6*  HGB 13.1 11.8*  HCT 38.5 35.9*  PLT 141* 168   BMET Recent Labs    04/10/18 0358 04/11/18 0435  NA 138 141  K 4.4 4.1  CL 105 109  CO2 20* 21*  GLUCOSE 79 68  BUN 21* 22*  CREATININE 1.15* 1.13*  CALCIUM 9.5 9.4   PT/INR No results for input(s): LABPROT, INR in the last 72 hours. CMP     Component Value Date/Time   NA 141 04/11/2018 0435   NA 135 12/07/2017 1427   NA 141 02/19/2017 1111   K 4.1 04/11/2018 0435   K 4.3 02/19/2017 1111   CL 109 04/11/2018 0435   CO2 21 (L) 04/11/2018 0435   CO2 26 02/19/2017 1111   GLUCOSE 68 04/11/2018 0435   GLUCOSE 72 02/19/2017 1111   BUN 22 (H) 04/11/2018 0435   BUN 20 12/07/2017 1427   BUN 16.7 02/19/2017 1111   CREATININE 1.13 (H) 04/11/2018 0435   CREATININE 0.9 02/19/2017 1111   CALCIUM 9.4 04/11/2018 0435   CALCIUM 10.2 02/19/2017 1111   PROT 6.7 04/11/2018 0435   PROT 7.3 08/23/2017 1542   PROT 7.5 02/19/2017 1111   ALBUMIN 3.7 04/11/2018 0435   ALBUMIN 4.1 08/23/2017 1542   ALBUMIN 3.8 02/19/2017 1111   AST 25  04/11/2018 0435   AST 14 02/19/2017 1111   ALT 14 04/11/2018 0435   ALT 8 02/19/2017 1111   ALKPHOS 53 04/11/2018 0435   ALKPHOS 92 02/19/2017 1111   BILITOT 0.7 04/11/2018 0435   BILITOT 0.3 08/23/2017 1542   BILITOT 0.36 02/19/2017 1111   GFRNONAA 43 (L) 04/11/2018 0435   GFRAA 50 (L) 04/11/2018 0435   Lipase     Component Value Date/Time   LIPASE 47 04/08/2018 1430       Studies/Results: Dg Abd 1 View  Result Date: 04/10/2018 CLINICAL DATA:  Small bowel obstruction EXAM: ABDOMEN - 1 VIEW COMPARISON:  04/09/2018 abdominal radiograph FINDINGS: Mildly to moderately dilated small bowel loops throughout the abdomen, mildly improved. Moderate colonic stool volume. No evidence of pneumatosis or pneumoperitoneum. No radiopaque nephrolithiasis. Decreased excreted contrast in the bladder. Marked lumbar spondylosis. Partially visualized left femoral neck pin. Surgical sutures overlie the left deep pelvis. IMPRESSION: Mildly to moderately dilated small bowel loops throughout the abdomen, mildly improved, suggesting improving small bowel obstruction. Electronically Signed   By: Ilona Sorrel M.D.   On: 04/10/2018 08:03   Dg Abd Portable 1v-small Bowel Obstruction Protocol-initial, 8 Hr Delay  Result Date: 04/10/2018 CLINICAL DATA:  Small-bowel obstruction, 8 hour delay  EXAM: PORTABLE ABDOMEN - 1 VIEW COMPARISON:  0920 hours and 0443 hours on the same day. FINDINGS: Gastric tip and side-port project over the left hemiabdomen presumably in the expected location of the stomach. Dilated small bowel loops are proportion to large bowel persists within the mid abdomen. Stool is seen within the ascending and proximal transverse colon. Dextroconvex curvature of the lumbar spine with multilevel degenerative disc disease and endplate spurring consistent with lumbar spondylosis is stable. Sclerosis about the SI joints bilaterally. Partially imaged left femoral nail fixation. No acute osseous abnormality.  IMPRESSION: Redemonstration of gaseous distention of small bowel out of proportion to large bowel loops. No significant change. Electronically Signed   By: Ashley Royalty M.D.   On: 04/10/2018 22:52   Dg Abd Portable 1v-small Bowel Protocol-position Verification  Result Date: 04/10/2018 CLINICAL DATA:  Nasogastric tube placement. Small bowel obstruction. EXAM: PORTABLE ABDOMEN - 1 VIEW COMPARISON:  04/10/2018 FINDINGS: There has been placement of a nasogastric tube with tip overlying the mid body of the stomach. Several dilated small bowel loops are again seen in the right upper quadrant, with elevation of right hemidiaphragm. IMPRESSION: Nasogastric tube tip overlies the mid gastric body. Electronically Signed   By: Earle Gell M.D.   On: 04/10/2018 11:38    Anti-infectives: Anti-infectives (From admission, onward)   Start     Dose/Rate Route Frequency Ordered Stop   04/08/18 1715  vancomycin (VANCOCIN) 1,250 mg in sodium chloride 0.9 % 250 mL IVPB  Status:  Discontinued     1,250 mg 166.7 mL/hr over 90 Minutes Intravenous  Once 04/08/18 1711 04/08/18 1732   04/08/18 1715  cefTRIAXone (ROCEPHIN) 1 g in sodium chloride 0.9 % 100 mL IVPB  Status:  Discontinued     1 g 200 mL/hr over 30 Minutes Intravenous  Once 04/08/18 1711 04/08/18 1732   04/08/18 1715  ampicillin (OMNIPEN) 1 g in sodium chloride 0.9 % 100 mL IVPB  Status:  Discontinued     1 g 300 mL/hr over 20 Minutes Intravenous  Once 04/08/18 1711 04/08/18 1728       Assessment/Plan SBO - PMH sig colectomy for volvulus, PMH ex lap 2015 for SBO - NG tube replaced yesterday and SB protocol ordered - 1,750 cc/24h from NG tube, continue to LIWs and await further bowel function - AXR improved compared to yesterday  - lots of stool in colon on AXR, pt my benefit from suppository or enema   Plan: improved abd film. Continue NG tube to LIWS today. May be able to D/C tomorrow if starts having bowel function.    LOS: 3 days    [facility] 04/11/2018, 8:34 AM Pager: [phone] Consults: [phone] Mon-Fri 7:00 am-4:30 pm Sat-Sun 7:00 am-11:30 am

## 2018-04-12 ENCOUNTER — Inpatient Hospital Stay (HOSPITAL_COMMUNITY): Payer: Medicare Other

## 2018-04-12 LAB — CBC WITH DIFFERENTIAL/PLATELET
BASOS ABS: 0 10*3/uL (ref 0.0–0.1)
BASOS PCT: 0 %
Eosinophils Absolute: 0 10*3/uL (ref 0.0–0.7)
Eosinophils Relative: 0 %
HEMATOCRIT: 35.2 % — AB (ref 36.0–46.0)
Hemoglobin: 11.5 g/dL — ABNORMAL LOW (ref 12.0–15.0)
Lymphocytes Relative: 16 %
Lymphs Abs: 0.7 10*3/uL (ref 0.7–4.0)
MCH: 29.3 pg (ref 26.0–34.0)
MCHC: 32.7 g/dL (ref 30.0–36.0)
MCV: 89.8 fL (ref 78.0–100.0)
MONO ABS: 0.4 10*3/uL (ref 0.1–1.0)
Monocytes Relative: 9 %
NEUTROS ABS: 3.2 10*3/uL (ref 1.7–7.7)
Neutrophils Relative %: 75 %
Platelets: 172 10*3/uL (ref 150–400)
RBC: 3.92 MIL/uL (ref 3.87–5.11)
RDW: 13 % (ref 11.5–15.5)
WBC: 4.3 10*3/uL (ref 4.0–10.5)

## 2018-04-12 LAB — COMPREHENSIVE METABOLIC PANEL
ALBUMIN: 3.5 g/dL (ref 3.5–5.0)
ALT: 16 U/L (ref 14–54)
AST: 25 U/L (ref 15–41)
Alkaline Phosphatase: 48 U/L (ref 38–126)
Anion gap: 13 (ref 5–15)
BILIRUBIN TOTAL: 0.9 mg/dL (ref 0.3–1.2)
BUN: 21 mg/dL — AB (ref 6–20)
CO2: 18 mmol/L — ABNORMAL LOW (ref 22–32)
Calcium: 9.3 mg/dL (ref 8.9–10.3)
Chloride: 112 mmol/L — ABNORMAL HIGH (ref 101–111)
Creatinine, Ser: 1.11 mg/dL — ABNORMAL HIGH (ref 0.44–1.00)
GFR calc Af Amer: 51 mL/min — ABNORMAL LOW (ref >60)
GFR calc non Af Amer: 44 mL/min — ABNORMAL LOW (ref >60)
GLUCOSE: 62 mg/dL — AB (ref 65–99)
POTASSIUM: 3.9 mmol/L (ref 3.5–5.1)
Sodium: 143 mmol/L (ref 135–145)
Total Protein: 6.4 g/dL — ABNORMAL LOW (ref 6.5–8.1)

## 2018-04-12 LAB — GLUCOSE, CAPILLARY
GLUCOSE-CAPILLARY: 129 mg/dL — AB (ref 65–99)
Glucose-Capillary: 43 mg/dL — CL (ref 65–99)

## 2018-04-12 LAB — MAGNESIUM: Magnesium: 2 mg/dL (ref 1.7–2.4)

## 2018-04-12 LAB — PHOSPHORUS: Phosphorus: 2.7 mg/dL (ref 2.5–4.6)

## 2018-04-12 MED ORDER — DEXTROSE 50 % IV SOLN: 1.0000 | INTRAVENOUS | Status: DC | PRN

## 2018-04-12 MED ORDER — DEXTROSE 50 % IV SOLN
INTRAVENOUS | Status: AC
  Administered 2018-04-12: 50 mL
  Filled 2018-04-12: qty 50

## 2018-04-12 MED ORDER — DEXTROSE 5 % IV SOLN
INTRAVENOUS | Status: DC
  Administered 2018-04-12 – 2018-04-15 (×5): via INTRAVENOUS

## 2018-04-12 MED ORDER — PHENOL 1.4 % MT LIQD
1.0000 | OROMUCOSAL | Status: DC | PRN
  Filled 2018-04-12: qty 177

## 2018-04-12 NOTE — Progress Notes (Signed)
Hypoglycemic Event  CBG: 43  Treatment: D50 IV 25 mL  Symptoms: None  Follow-up CBG: Time:2130 CBG Result:92  Possible Reasons for Event: Other:  Pt is npo  Comments/MD notified:Kirby NP    Si Gaul Mahario

## 2018-04-12 NOTE — Progress Notes (Signed)
PROGRESS NOTE    Erika Day  OEU:[id number] DOB: [birth year] DOA: 04/08/2018 PCP: Mack Hook, MD   Brief Narrative:  84 year old female with a history of marginal zone lymphoma, MGUS,diabetes, hypertension,previous history of sigmoid colectomy due to second recurrence of sigmoid volvulus in 2017,who presented to the ER today with chief complaint of right upper quadrant abdominal pain starting at 3 AM this morning associated with an episode of vomiting and diarrhea. Patient describes a feeling of being constipated. She complained of diffuse abdominal pain. No chest pain or shortness of breath , no fever chills, rigors. Last BM was yesterday at 11:00 am . Unable to belch or pass flatus. CT abdomen pelvis shows multiple dilated small bowel loops in the abdomen and pelvis, suspicious for developing small bowel obstruction. Surgery was consulted and NGT Placed and patient undewent Small Bowel Protocol but unfortunately removed NGT overnight of 6/7-6/8. Because she worsened with pain and distention she had her NGT replaced by General Surgery and underwent Small Bowel Protocol with Gastrograffin. She was still complaining of significant Abdominal Pain yesterday but Abdominal X-Ray improving and patient starting to have BM's so NGT to be clamped today by Surgery.   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   Volvulus of sigmoid colon (HCC)   MGUS (monoclonal gammopathy of unknown significance)  Acute Small Bowel Obstruction (HCC) -Has Extensive prior surgical history,  including history of recurrent sigmoid volvulus, status post sigmoid colectomy -Now presenting with recurrent small bowel obstruction -NG tube in placed and to be  Clamped today by General Surgery  -General Surgery Consulted and placed patient on Small Bowel Protocol with Gastrografin again today after NGT was placed  -NS at 100 mL/hr reduced to 75 mL/hr and will continue today given NPO Status -Supportive Care and pain control with IV fentanyl 12.5 to 25 mcg every 2 hours PRN for moderate and severe pain increased to 25-50 mcg q2h -NPO and continue Bowel Rest -Ambulate and OOB -Maintain K+ >4.0 and Mag Level >2.0 -KUB this AM showed  -Repeat KUB in AM showed Scattered large and small bowel gas is noted. Fecal material is noted within the right colon. Nasogastric catheter is seen within the stomach. No obstructive changes are noted. The overall appearance is similar to that seen on prior exam.  -Started patient on Bisacodyl 10 mg RC Suppositories and will continue for Now -It appears SBO is resolving and NGT Clamping Trial Today -Diet Advancement per General Surgery  Essential Hypertension -Hold Home Lisinopril due to NPO Status  -BP was 135/72 this Afternoon and BP this AM was 173/88  -Hydralazine IV 10 mg every 6 PRN for systolic blood pressure greater than 154 or diastolic blood pressure greater than 100  Osteoarthritis -Patient is on when necessary Robaxin at home -Can continue Acetaminophen 650 mg po/RC    Thrombocytopenia, improved  -Patient's Platelet Count was 129 and improved to 172 -Continue to Monitor and repeat CBC in AM    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  Normocytic Anemia    -Patient's hemoglobin/hematocrit went from 13.1/38.5 and is now 11.5/35.2 -Likely dilutional drop from IV fluid resuscitation from normal saline -Has a history of chronic kidney disease stage III and possibly from that -Continue to monitor for signs and symptoms of bleeding -Repeat CBC in a.m.  Non-Gap Metabolic Acidosis -Patient's CO2 was 18 and AG was 13 -Patient was also Hyperchloremic this AM at 112 -Continue to Monitor Closely and repeat CMP in AM  DVT prophylaxis: Enoxaparin 40 mg sq q24h Code Status: FULL CODE Family Communication: No family present at bedside  Disposition Plan: Remain Inpatient for continued workup and Treatment and Anticipate D/C in next 24-48 hours if improved and able to tolerate Diet  Consultants:   General Surgery    Procedures: None   Antimicrobials:  Anti-infectives (From admission, onward)   Start     Dose/Rate Route Frequency Ordered Stop   04/08/18 1715  vancomycin (VANCOCIN) 1,250 mg in sodium chloride 0.9 % 250 mL IVPB  Status:  Discontinued     1,250 mg 166.7 mL/hr over 90 Minutes Intravenous  Once 04/08/18 1711 04/08/18 1732   04/08/18 1715  cefTRIAXone (ROCEPHIN) 1 g in sodium chloride 0.9 % 100 mL IVPB  Status:  Discontinued     1 g 200 mL/hr over 30 Minutes Intravenous  Once 04/08/18 1711 04/08/18 1732   04/08/18 1715  ampicillin (OMNIPEN) 1 g in sodium chloride 0.9 % 100 mL IVPB  Status:  Discontinued     1 g 300 mL/hr over 20 Minutes Intravenous  Once 04/08/18 1711 04/08/18 1728     Subjective: Seen and examined this AM and she states that her abdominal pain has improved significantly.  No chest pain, shortness breath, nausea, vomiting.  Wanting to have something to drink and requesting help.  No other concerns or complaints at this time.  Objective: Vitals:   04/11/18 1215 04/11/18 2030 04/12/18 0352 04/12/18 1245  BP: (!) 162/83 (!) 173/88 100/74 135/72  Pulse: 90 90 (!) 104 91  Resp: 17 20 18 17   Temp: 97.7 F (36.5 C)  97.9 F (36.6 C) 99.1 F (37.3 C) 98.1 F (36.7 C)  TempSrc: Oral Oral Oral Oral  SpO2: 98% 98% 99% 95%  Weight:      Height:        Intake/Output Summary (Last 24 hours) at 04/12/2018 1412 Last data filed at 04/12/2018 0416 Gross per 24 hour  Intake -  Output 1300 ml  Net -1300 ml   Filed Weights   04/08/18 1249 04/08/18 2000  Weight: 63 kg (139 lb) 72 kg (158 lb 11.7 oz)   Examination: Physical Exam:  Constitutional: Well-nourished, well-developed elderly female who is currently in no acute distress and appears more comfortable than yesterday. Eyes: Sclera anicteric.  Lids and conjunctive are normal ENMT: External ears and nose appear normal.  Has NG tube in place Neck: Appears supple with no JVD Respiratory: Diminished to auscultation bilaterally with no appreciable wheezing, rales, rhonchi. Cardiovascular: Regular rate and rhythm; Has a 2/6 Systolic Murmur.  No lower extremity edema noted  Abdomen: Soft, nontender to palpate.  Distention is markedly improved.  Bowel sounds are present however are slightly diminished GU: Deferred Musculoskeletal: No contractures or cyanosis noted.  No joint deformities  Skin: Skin is warm and dry with no appreciable rashes or lesions on limited skin evaluation Neurologic: Cranial nerves II through XII grossly intact no appreciable focal deficits Psychiatric: Normal mood and affect.  Intact judgment intact.  Patient is awake alert and oriented x3  Data Reviewed: I have personally reviewed following labs and imaging studies  CBC: Recent Labs  Lab 04/08/18 1430 04/09/18 0440 04/10/18 0358 04/11/18 0435 04/12/18 0426  WBC 4.2 3.4* 3.2* 3.6* 4.3  NEUTROABS 3.0  --  2.3 2.6 3.2  HGB 12.2 12.5 13.1 11.8* 11.5*  HCT 36.4 37.2 38.5 35.9* 35.2*  MCV 87.9 88.2 86.1 88.2 89.8  PLT 161 129* 141* 168 784   Basic Metabolic Panel: Recent Labs  Lab 04/08/18 1430 04/09/18 0440 04/10/18 0358 04/11/18 0435 04/12/18 0426  NA 132* 136 138 141  143  K 4.3 4.4 4.4 4.1 3.9  CL 97* 100* 105 109 112*  CO2 26 25 20* 21* 18*  GLUCOSE 119* 108* 79 68 62*  BUN 20 19 21* 22* 21*  CREATININE 1.23* 1.20* 1.15* 1.13* 1.11*  CALCIUM 9.8 9.8 9.5 9.4 9.3  MG 2.0 1.8 1.9 2.0 2.0  PHOS  --  3.8 3.9 3.3 2.7   GFR: Estimated Creatinine Clearance: 35.4 mL/min (A) (by C-G formula based on SCr of 1.11 mg/dL (H)). Liver Function Tests: Recent Labs  Lab 04/08/18 1430 04/09/18 0440 04/10/18 0358 04/11/18 0435 04/12/18 0426  AST 20 22 27 25 25   ALT 11* 11* 15 14 16   ALKPHOS 62 57 59 53 48  BILITOT 0.7 0.6 1.0 0.7 0.9  PROT 7.4 7.2 7.1 6.7 6.4*  ALBUMIN 4.2 4.0 3.9 3.7 3.5   Recent Labs  Lab 04/08/18 1430  LIPASE 47   No results for input(s): AMMONIA in the last 168 hours. Coagulation Profile: No results for input(s): INR, PROTIME in the last 168 hours. Cardiac Enzymes: No results for input(s): CKTOTAL, CKMB, CKMBINDEX, TROPONINI in the last 168 hours. BNP (last 3 results) No results for input(s): PROBNP in the last 8760 hours. HbA1C: No results for input(s): HGBA1C in the last 72 hours. CBG: No results for input(s): GLUCAP in the last 168 hours. Lipid Profile: No results for input(s): CHOL, HDL, LDLCALC, TRIG, CHOLHDL, LDLDIRECT in the last 72 hours. Thyroid Function Tests: No results for input(s): TSH, T4TOTAL, FREET4, T3FREE, THYROIDAB in the last 72 hours. Anemia Panel: No results for input(s): VITAMINB12, FOLATE, FERRITIN, TIBC, IRON, RETICCTPCT in the last 72 hours. Sepsis Labs: No results for input(s): PROCALCITON, LATICACIDVEN in the last 168 hours.  No results found for this or any previous visit (from the past 240 hour(s)).   Radiology Studies: Dg Abd 1 View  Result Date: 04/12/2018 CLINICAL DATA:  Abdominal distension EXAM: ABDOMEN - 1 VIEW COMPARISON:  04/11/2018 FINDINGS: Scattered large and small bowel gas is noted. Fecal material is noted within the right colon. Nasogastric catheter is seen within the stomach.  No obstructive changes are noted. The overall appearance is similar to that seen on prior exam. IMPRESSION: Stable appearance of the abdomen when compared from the previous day. Electronically Signed   By: Inez Catalina M.D.   On: 04/12/2018 08:54   Dg Abd 1 View  Result Date: 04/11/2018 CLINICAL DATA:  Small bowel obstruction. EXAM: ABDOMEN - 1 VIEW COMPARISON:  Radiographs dated 04/10/2018, 04/09/2018 and 04/08/2018 and CT scan dated 04/08/2018 FINDINGS: NG tube tip in the body of the stomach. Decreased air in the bowel since the prior study. No discrete  dilated small bowel loops. No acute bone abnormality. IMPRESSION: Improved bowel gas pattern. No visible residual dilated small bowel loops. Electronically Signed   By: Lorriane Shire M.D.   On: 04/11/2018 08:47   Dg Abd Portable 1v-small Bowel Obstruction Protocol-initial, 8 Hr Delay  Result Date: 04/10/2018 CLINICAL DATA:  Small-bowel obstruction, 8 hour delay EXAM: PORTABLE ABDOMEN - 1 VIEW COMPARISON:  0920 hours and 0443 hours on the same day. FINDINGS: Gastric tip and side-port project over the left hemiabdomen presumably in the expected location of the stomach. Dilated small bowel loops are proportion to large bowel persists within the mid abdomen. Stool is seen within the ascending and proximal transverse colon. Dextroconvex curvature of the lumbar spine with multilevel degenerative disc disease and endplate spurring consistent with lumbar spondylosis is stable. Sclerosis about the SI joints bilaterally. Partially imaged left femoral nail fixation. No acute osseous abnormality. IMPRESSION: Redemonstration of gaseous distention of small bowel out of proportion to large bowel loops. No significant change. Electronically Signed   By: Ashley Royalty M.D.   On: 04/10/2018 22:52   Scheduled Meds: . enoxaparin (LOVENOX) injection  40 mg Subcutaneous Q24H  . fluticasone  2 spray Each Nare Daily  . loteprednol  1 drop Left Eye Daily   Continuous  Infusions: . sodium chloride 75 mL/hr (04/11/18 1554)  . famotidine (PEPCID) IV Stopped (04/12/18 1275)    LOS: 4 days   Kerney Elbe, DO [facility] Pager [phone]  If 7PM-7AM, please contact night-coverage [email or web address] Password [email or web address] 04/12/2018, 2:12 PM

## 2018-04-12 NOTE — Progress Notes (Signed)
[facility] Progress Note     Subjective: CC:  Reports large BM early this morning. Still having epigastric pain. Reports mobilizing with a walker at home prior to this hospitalization. NGT w/  1300 mL/24h.  Again states that, even if she were to become more sick, be unable to eat, or even die from this, she would not want an operation because she is "too old for surgery" and would rather go "be with the [place]".   Objective: Vital signs in last 24 hours: Temp:  [97.7 F (36.5 C)-99.1 F (37.3 C)] 99.1 F (37.3 C) (06/11 0352) Pulse Rate:  [90-104] 104 (06/11 0352) Resp:  [17-20] 18 (06/11 0352) BP: (100-173)/(74-88) 100/74 (06/11 0352) SpO2:  [98 %-99 %] 99 % (06/11 0352) Last BM Date: 04/11/18  Intake/Output from previous day: 06/10 0701 - 06/11 0700 In: -  Out: 1300 [Emesis/NG output:1300] Intake/Output this shift: No intake/output data recorded.  PE: Gen:  Alert, NAD, pleasant and cooperative  Card:  Regular rate and rhythm Pulm:  Normal effort, clear to auscultation bilaterally Abd: Soft, mild epigastric tenderness, no peritonitis, +BS, NGT output bilious and non-bloody  Skin: warm and dry, no rashes  Psych: A&Ox3   Lab Results:  Recent Labs    04/11/18 0435 04/12/18 0426  WBC 3.6* 4.3  HGB 11.8* 11.5*  HCT 35.9* 35.2*  PLT 168 172   BMET Recent Labs    04/11/18 0435 04/12/18 0426  NA 141 143  K 4.1 3.9  CL 109 112*  CO2 21* 18*  GLUCOSE 68 62*  BUN 22* 21*  CREATININE 1.13* 1.11*  CALCIUM 9.4 9.3   PT/INR No results for input(s): LABPROT, INR in the last 72 hours. CMP     Component Value Date/Time   NA 143 04/12/2018 0426   NA 135 12/07/2017 1427   NA 141 02/19/2017 1111   K 3.9 04/12/2018 0426   K 4.3 02/19/2017 1111   CL 112 (H) 04/12/2018 0426   CO2 18 (L) 04/12/2018 0426   CO2 26 02/19/2017 1111   GLUCOSE 62 (L) 04/12/2018 0426   GLUCOSE 72 02/19/2017 1111   BUN 21 (H) 04/12/2018 0426   BUN 20 12/07/2017 1427   BUN  16.7 02/19/2017 1111   CREATININE 1.11 (H) 04/12/2018 0426   CREATININE 0.9 02/19/2017 1111   CALCIUM 9.3 04/12/2018 0426   CALCIUM 10.2 02/19/2017 1111   PROT 6.4 (L) 04/12/2018 0426   PROT 7.3 08/23/2017 1542   PROT 7.5 02/19/2017 1111   ALBUMIN 3.5 04/12/2018 0426   ALBUMIN 4.1 08/23/2017 1542   ALBUMIN 3.8 02/19/2017 1111   AST 25 04/12/2018 0426   AST 14 02/19/2017 1111   ALT 16 04/12/2018 0426   ALT 8 02/19/2017 1111   ALKPHOS 48 04/12/2018 0426   ALKPHOS 92 02/19/2017 1111   BILITOT 0.9 04/12/2018 0426   BILITOT 0.3 08/23/2017 1542   BILITOT 0.36 02/19/2017 1111   GFRNONAA 44 (L) 04/12/2018 0426   GFRAA 51 (L) 04/12/2018 0426   Lipase     Component Value Date/Time   LIPASE 47 04/08/2018 1430       Studies/Results: Dg Abd 1 View  Result Date: 04/11/2018 CLINICAL DATA:  Small bowel obstruction. EXAM: ABDOMEN - 1 VIEW COMPARISON:  Radiographs dated 04/10/2018, 04/09/2018 and 04/08/2018 and CT scan dated 04/08/2018 FINDINGS: NG tube tip in the body of the stomach. Decreased air in the bowel since the prior study. No discrete dilated small bowel loops. No acute bone abnormality. IMPRESSION:  Improved bowel gas pattern. No visible residual dilated small bowel loops. Electronically Signed   By: Lorriane Shire M.D.   On: 04/11/2018 08:47   Dg Abd Portable 1v-small Bowel Obstruction Protocol-initial, 8 Hr Delay  Result Date: 04/10/2018 CLINICAL DATA:  Small-bowel obstruction, 8 hour delay EXAM: PORTABLE ABDOMEN - 1 VIEW COMPARISON:  0920 hours and 0443 hours on the same day. FINDINGS: Gastric tip and side-port project over the left hemiabdomen presumably in the expected location of the stomach. Dilated small bowel loops are proportion to large bowel persists within the mid abdomen. Stool is seen within the ascending and proximal transverse colon. Dextroconvex curvature of the lumbar spine with multilevel degenerative disc disease and endplate spurring consistent with lumbar  spondylosis is stable. Sclerosis about the SI joints bilaterally. Partially imaged left femoral nail fixation. No acute osseous abnormality. IMPRESSION: Redemonstration of gaseous distention of small bowel out of proportion to large bowel loops. No significant change. Electronically Signed   By: Ashley Royalty M.D.   On: 04/10/2018 22:52   Dg Abd Portable 1v-small Bowel Protocol-position Verification  Result Date: 04/10/2018 CLINICAL DATA:  Nasogastric tube placement. Small bowel obstruction. EXAM: PORTABLE ABDOMEN - 1 VIEW COMPARISON:  04/10/2018 FINDINGS: There has been placement of a nasogastric tube with tip overlying the mid body of the stomach. Several dilated small bowel loops are again seen in the right upper quadrant, with elevation of right hemidiaphragm. IMPRESSION: Nasogastric tube tip overlies the mid gastric body. Electronically Signed   By: Earle Gell M.D.   On: 04/10/2018 11:38    Anti-infectives: Anti-infectives (From admission, onward)   Start     Dose/Rate Route Frequency Ordered Stop   04/08/18 1715  vancomycin (VANCOCIN) 1,250 mg in sodium chloride 0.9 % 250 mL IVPB  Status:  Discontinued     1,250 mg 166.7 mL/hr over 90 Minutes Intravenous  Once 04/08/18 1711 04/08/18 1732   04/08/18 1715  cefTRIAXone (ROCEPHIN) 1 g in sodium chloride 0.9 % 100 mL IVPB  Status:  Discontinued     1 g 200 mL/hr over 30 Minutes Intravenous  Once 04/08/18 1711 04/08/18 1732   04/08/18 1715  ampicillin (OMNIPEN) 1 g in sodium chloride 0.9 % 100 mL IVPB  Status:  Discontinued     1 g 300 mL/hr over 20 Minutes Intravenous  Once 04/08/18 1711 04/08/18 1728     Assessment/Plan SBO - PMH sig colectomy for volvulus, PMH ex lap 2015 for SBO - NG tube replaced yesterday and SB protocol ordered; AXR improving  - 1,300 cc/24h from NG tube, but started having BMs early thsi morning - will order NG tube clamping trial  - continue PRN suppositories   Plan: clamping trial     LOS: 4 days     Jill Alexanders , [facility] 04/12/2018, 8:34 AM Pager: [phone] Consults: [phone] Mon-Fri 7:00 am-4:30 pm Sat-Sun 7:00 am-11:30 am

## 2018-04-13 ENCOUNTER — Inpatient Hospital Stay (HOSPITAL_COMMUNITY): Payer: Medicare Other

## 2018-04-13 LAB — GLUCOSE, CAPILLARY
GLUCOSE-CAPILLARY: 98 mg/dL (ref 65–99)
GLUCOSE-CAPILLARY: 230 mg/dL — AB (ref 65–99)
Glucose-Capillary: 154 mg/dL — ABNORMAL HIGH (ref 65–99)
Glucose-Capillary: 214 mg/dL — ABNORMAL HIGH (ref 65–99)
Glucose-Capillary: 92 mg/dL (ref 65–99)
Glucose-Capillary: 95 mg/dL (ref 65–99)

## 2018-04-13 LAB — COMPREHENSIVE METABOLIC PANEL
ALBUMIN: 3.5 g/dL (ref 3.5–5.0)
ALK PHOS: 45 U/L (ref 38–126)
ALT: 15 U/L (ref 14–54)
AST: 22 U/L (ref 15–41)
Anion gap: 8 (ref 5–15)
BILIRUBIN TOTAL: 0.5 mg/dL (ref 0.3–1.2)
BUN: 19 mg/dL (ref 6–20)
CALCIUM: 9.3 mg/dL (ref 8.9–10.3)
CO2: 24 mmol/L (ref 22–32)
Chloride: 112 mmol/L — ABNORMAL HIGH (ref 101–111)
Creatinine, Ser: 1.08 mg/dL — ABNORMAL HIGH (ref 0.44–1.00)
GFR calc Af Amer: 52 mL/min — ABNORMAL LOW (ref >60)
GFR calc non Af Amer: 45 mL/min — ABNORMAL LOW (ref >60)
GLUCOSE: 100 mg/dL — AB (ref 65–99)
Potassium: 3.7 mmol/L (ref 3.5–5.1)
Sodium: 144 mmol/L (ref 135–145)
TOTAL PROTEIN: 6.4 g/dL — AB (ref 6.5–8.1)

## 2018-04-13 LAB — CBC WITH DIFFERENTIAL/PLATELET
BASOS ABS: 0 10*3/uL (ref 0.0–0.1)
BASOS PCT: 0 %
Eosinophils Absolute: 0 10*3/uL (ref 0.0–0.7)
Eosinophils Relative: 1 %
HEMATOCRIT: 34.2 % — AB (ref 36.0–46.0)
HEMOGLOBIN: 11.1 g/dL — AB (ref 12.0–15.0)
LYMPHS PCT: 20 %
Lymphs Abs: 0.7 10*3/uL (ref 0.7–4.0)
MCH: 28.8 pg (ref 26.0–34.0)
MCHC: 32.5 g/dL (ref 30.0–36.0)
MCV: 88.6 fL (ref 78.0–100.0)
MONOS PCT: 13 %
Monocytes Absolute: 0.4 10*3/uL (ref 0.1–1.0)
NEUTROS ABS: 2.2 10*3/uL (ref 1.7–7.7)
NEUTROS PCT: 66 %
Platelets: 148 10*3/uL — ABNORMAL LOW (ref 150–400)
RBC: 3.86 MIL/uL — ABNORMAL LOW (ref 3.87–5.11)
RDW: 13.2 % (ref 11.5–15.5)
WBC: 3.4 10*3/uL — ABNORMAL LOW (ref 4.0–10.5)

## 2018-04-13 LAB — PHOSPHORUS: Phosphorus: 2 mg/dL — ABNORMAL LOW (ref 2.5–4.6)

## 2018-04-13 LAB — MAGNESIUM: Magnesium: 2 mg/dL (ref 1.7–2.4)

## 2018-04-13 MED ORDER — MAGNESIUM HYDROXIDE 400 MG/5ML PO SUSP
960.0000 mL | Freq: Once | ORAL | Status: AC
  Administered 2018-04-13: 960 mL via RECTAL
  Filled 2018-04-13: qty 473

## 2018-04-13 NOTE — Progress Notes (Signed)
PROGRESS NOTE    Erika Day  SWF:[id number] DOB: Mar 29, 1930 DOA: 04/08/2018 PCP: Mack Hook, MD   Brief Narrative:  84 year old female with a history of marginal zone lymphoma, MGUS,diabetes, hypertension,previous history of sigmoid colectomy due to second recurrence of sigmoid volvulus in 2017,who presented to the ER today with chief complaint of right upper quadrant abdominal pain starting at 3 AM this morning associated with an episode of vomiting and diarrhea. Patient describes a feeling of being constipated. She complained of diffuse abdominal pain. No chest pain or shortness of breath , no fever chills, rigors. Last BM was yesterday at 11:00 am . Unable to belch or pass flatus. CT abdomen pelvis shows multiple dilated small bowel loops in the abdomen and pelvis, suspicious for developing small bowel obstruction. Surgery was consulted and NGT Placed and patient undewent Small Bowel Protocol but unfortunately removed NGT overnight of 6/7-6/8. Because she worsened with pain and distention she had her NGT replaced by General Surgery and underwent Small Bowel Protocol with Gastrograffin. She was still complaining of significant Abdominal Pain yesterday but Abdominal X-Ray improving and patient starting to have BM's so NGT to be clamped today by Surgery.   04/13/18: Patient is breaking wind.  Patient also tells me that she had bowel movement last night.  NG tube is clamped.  Patient is currently on clear liquid diet.  Surgical team is directing patient's dietary management.  No new complaints.  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   Volvulus of sigmoid colon (HCC)   MGUS (monoclonal gammopathy of unknown  significance)  Acute Small Bowel Obstruction (HCC) -Has Extensive prior surgical history,  including history of recurrent sigmoid volvulus, status post sigmoid colectomy -Now presenting with recurrent small bowel obstruction -NG tube in placed and to be Clamped today by General Surgery  -General Surgery Consulted and placed patient on Small Bowel Protocol with Gastrografin again today after NGT was placed  -NS at 100 mL/hr reduced to 75 mL/hr and will continue today given NPO Status -Supportive Care and pain control with IV fentanyl 12.5 to 25 mcg every 2 hours PRN for moderate and severe pain increased to 25-50 mcg q2h -NPO and continue Bowel Rest -Ambulate and OOB -Maintain K+ >4.0 and Mag Level >2.0 -KUB this AM showed  -Repeat KUB in AM showed Scattered large and small bowel gas is noted. Fecal material is noted within the right colon. Nasogastric catheter is seen within the stomach. No obstructive changes are noted. The overall appearance is similar to that seen on prior exam.  -Started patient on Bisacodyl 10 mg RC Suppositories and will continue for Now -It appears SBO is resolving and NGT Clamping Trial Today -Diet Advancement per General Surgery  Essential Hypertension -Hold Home Lisinopril due to NPO Status  -BP was 135/72 this Afternoon and BP this AM was 173/88  -Hydralazine IV 10 mg every 6 PRN for systolic blood pressure greater than 220 or diastolic blood pressure greater than 100  Osteoarthritis -Patient is on when necessary Robaxin at home -Can continue Acetaminophen 650 mg po/RC    Thrombocytopenia, improved  -Patient's Platelet Count was 129 and improved to 172 -Continue to Monitor and repeat CBC in AM    Small B-cell lymphoma of  lymph nodes of axilla / CLL/ MGUS -Patient is followed by  Dr.Gorsuch -This problem appears to be stable  CKD Stage 3 -Stable and improving slightly with IVF Rehydration  -BUN/Cr went from 20/1.23 -> 21/1.11 -Avoid Nephrotoxic  Medications if possible and hold Lisinopril -Repeat CMP in AM   GERD -Continue Famotidine IV 20 mg q12h for now as she is still NPO  Leukopenia, improved -Mild at 3.6 and improved to 4.3 -Continue to monitor and repeat CBC in a.m.  Normocytic Anemia  -Patient's hemoglobin/hematocrit went from 13.1/38.5 and is now 11.5/35.2 -Likely dilutional drop from IV fluid resuscitation from normal saline -Has a history of chronic kidney disease stage III and possibly from that -Continue to monitor for signs and symptoms of bleeding -Repeat CBC in a.m.  Non-Gap Metabolic Acidosis -Patient's CO2 was 18 and AG was 13 -Patient was also Hyperchloremic this AM at 112 -Continue to Monitor Closely and repeat CMP in AM  DVT prophylaxis: Enoxaparin 40 mg sq q24h Code Status: FULL CODE Family Communication: No family present at bedside  Disposition Plan: Remain Inpatient for continued workup and Treatment and Anticipate D/C in next 24-48 hours if improved and able to tolerate Diet  Consultants:   General Surgery    Procedures: None   Antimicrobials:  Anti-infectives (From admission, onward)   Start     Dose/Rate Route Frequency Ordered Stop   04/08/18 1715  vancomycin (VANCOCIN) 1,250 mg in sodium chloride 0.9 % 250 mL IVPB  Status:  Discontinued     1,250 mg 166.7 mL/hr over 90 Minutes Intravenous  Once 04/08/18 1711 04/08/18 1732   04/08/18 1715  cefTRIAXone (ROCEPHIN) 1 g in sodium chloride 0.9 % 100 mL IVPB  Status:  Discontinued     1 g 200 mL/hr over 30 Minutes Intravenous  Once 04/08/18 1711 04/08/18 1732   04/08/18 1715  ampicillin (OMNIPEN) 1 g in sodium chloride 0.9 % 100 mL IVPB  Status:  Discontinued     1 g 300 mL/hr over 20 Minutes Intravenous  Once 04/08/18 1711 04/08/18 1728     Subjective: Patient seen alongside patient's family, rounding team which includes case management and pharmacist.  Patient's nurse was present during this consultation.  No new complaints.   Tolerating clear liquids.  Bowel movement was reported last night.  Objective: Vitals:   04/12/18 1245 04/12/18 2035 04/13/18 0515 04/13/18 1413  BP: 135/72 (!) 141/79 (!) 162/89 125/90  Pulse: 91 97 89 99  Resp: 17 (!) 1 20 (!) 22  Temp: 98.1 F (36.7 C) 98.3 F (36.8 C) 97.9 F (36.6 C) (!) 97.4 F (36.3 C)  TempSrc: Oral Oral Oral Oral  SpO2: 95% 98% 97% 100%  Weight:      Height:        Intake/Output Summary (Last 24 hours) at 04/13/2018 1730 Last data filed at 04/13/2018 1206 Gross per 24 hour  Intake 3932.5 ml  Output 800 ml  Net 3132.5 ml   Filed Weights   04/08/18 1249 04/08/18 2000  Weight: 63 kg (139 lb) 72 kg (158 lb 11.7 oz)   Examination: Physical Exam:  Constitutional: Not in any distress.   Eyes: PERRLA.  No jaundice.   ENMT: External ears and nose appear normal.  Has NG tube in place Neck: Appears supple with no JVD Respiratory: Diminished to auscultation bilaterally with no appreciable wheezing, rales, rhonchi. Cardiovascular: Regular rate and rhythm; Has a 2/6 Systolic Murmur.  No lower extremity edema noted  Abdomen: Soft, nontender to palpate.  Distention is markedly improved.  Bowel sounds are present however are slightly diminished Musculoskeletal: No contractures or cyanosis noted.  No joint deformities Skin: Skin is warm and dry with no appreciable rashes or lesions on limited skin evaluation Neurologic: Awake, alert and oriented to time, place and person.  Patient moves all limbs.   Psychiatric: Normal mood and affect.  Intact judgment intact.  Patient is awake alert and oriented x3  Data Reviewed: I have personally reviewed following labs and imaging studies  CBC: Recent Labs  Lab 04/08/18 1430 04/09/18 0440 04/10/18 0358 04/11/18 0435 04/12/18 0426 04/13/18 0435  WBC 4.2 3.4* 3.2* 3.6* 4.3 3.4*  NEUTROABS 3.0  --  2.3 2.6 3.2 2.2  HGB 12.2 12.5 13.1 11.8* 11.5* 11.1*  HCT 36.4 37.2 38.5 35.9* 35.2* 34.2*  MCV 87.9 88.2 86.1 88.2  89.8 88.6  PLT 161 129* 141* 168 172 073*   Basic Metabolic Panel: Recent Labs  Lab 04/09/18 0440 04/10/18 0358 04/11/18 0435 04/12/18 0426 04/13/18 0435  NA 136 138 141 143 144  K 4.4 4.4 4.1 3.9 3.7  CL 100* 105 109 112* 112*  CO2 25 20* 21* 18* 24  GLUCOSE 108* 79 68 62* 100*  BUN 19 21* 22* 21* 19  CREATININE 1.20* 1.15* 1.13* 1.11* 1.08*  CALCIUM 9.8 9.5 9.4 9.3 9.3  MG 1.8 1.9 2.0 2.0 2.0  PHOS 3.8 3.9 3.3 2.7 2.0*   GFR: Estimated Creatinine Clearance: 36.4 mL/min (A) (by C-G formula based on SCr of 1.08 mg/dL (H)). Liver Function Tests: Recent Labs  Lab 04/09/18 0440 04/10/18 0358 04/11/18 0435 04/12/18 0426 04/13/18 0435  AST 22 27 25 25 22   ALT 11* 15 14 16 15   ALKPHOS 57 59 53 48 45  BILITOT 0.6 1.0 0.7 0.9 0.5  PROT 7.2 7.1 6.7 6.4* 6.4*  ALBUMIN 4.0 3.9 3.7 3.5 3.5   Recent Labs  Lab 04/08/18 1430  LIPASE 47   No results for input(s): AMMONIA in the last 168 hours. Coagulation Profile: No results for input(s): INR, PROTIME in the last 168 hours. Cardiac Enzymes: No results for input(s): CKTOTAL, CKMB, CKMBINDEX, TROPONINI in the last 168 hours. BNP (last 3 results) No results for input(s): PROBNP in the last 8760 hours. HbA1C: No results for input(s): HGBA1C in the last 72 hours. CBG: Recent Labs  Lab 04/13/18 0515 04/13/18 0808 04/13/18 1202 04/13/18 1410 04/13/18 1631  GLUCAP 95 98 154* 214* 230*   Lipid Profile: No results for input(s): CHOL, HDL, LDLCALC, TRIG, CHOLHDL, LDLDIRECT in the last 72 hours. Thyroid Function Tests: No results for input(s): TSH, T4TOTAL, FREET4, T3FREE, THYROIDAB in the last 72 hours. Anemia Panel: No results for input(s): VITAMINB12, FOLATE, FERRITIN, TIBC, IRON, RETICCTPCT in the last 72 hours. Sepsis Labs: No results for input(s): PROCALCITON, LATICACIDVEN in the last 168 hours.  No results found for this or any previous visit (from the past 240 hour(s)).   Radiology Studies: Dg Abd 1  View  Result Date: 04/13/2018 CLINICAL DATA:  Abdominal distension EXAM: ABDOMEN - 1 VIEW COMPARISON:  April 12, 2018 FINDINGS: Nasogastric tube tip and side port are in the stomach. There is moderate stool throughout the colon. There is no bowel dilatation or air-fluid level to suggest bowel obstruction. No evident free air. There is degenerative change in the lumbar spine. There is postoperative change in the left proximal femur. IMPRESSION: Nasogastric tube tip and side port in stomach. No bowel obstruction or free air demonstrable. Moderate stool in colon. Electronically  Signed   By: Lowella Grip III M.D.   On: 04/13/2018 07:15   Dg Abd 1 View  Result Date: 04/12/2018 CLINICAL DATA:  Abdominal distension EXAM: ABDOMEN - 1 VIEW COMPARISON:  04/11/2018 FINDINGS: Scattered large and small bowel gas is noted. Fecal material is noted within the right colon. Nasogastric catheter is seen within the stomach. No obstructive changes are noted. The overall appearance is similar to that seen on prior exam. IMPRESSION: Stable appearance of the abdomen when compared from the previous day. Electronically Signed   By: Inez Catalina M.D.   On: 04/12/2018 08:54   Scheduled Meds: . enoxaparin (LOVENOX) injection  40 mg Subcutaneous Q24H  . fluticasone  2 spray Each Nare Daily  . loteprednol  1 drop Left Eye Daily   Continuous Infusions: . dextrose 75 mL/hr at 04/13/18 0936  . famotidine (PEPCID) IV Stopped (04/13/18 1025)    LOS: 5 days   Pervis Hocking [facility] Pager [phone]  If 7PM-7AM, please contact night-coverage [email or web address] Password [facility] 04/13/2018, 5:30 PM

## 2018-04-13 NOTE — Progress Notes (Signed)
[facility] Progress Note     Subjective: CC- SBO Patient states that she continues to have epigastric pain. Denies abdominal bloating or any n/v since NG tube clamped yesterday morning. Reports having another BM last night. No flatus this morning.  States that she spent about 1 hour in the chair yesterday.  Xray this AM shows resolution of SBO, and moderate stool burden in colon.  Objective: Vital signs in last 24 hours: Temp:  [97.9 F (36.6 C)-98.3 F (36.8 C)] 97.9 F (36.6 C) (06/12 0515) Pulse Rate:  [89-97] 89 (06/12 0515) Resp:  [1-20] 20 (06/12 0515) BP: (135-162)/(72-89) 162/89 (06/12 0515) SpO2:  [95 %-98 %] 97 % (06/12 0515) Last BM Date: 04/11/18  Intake/Output from previous day: 06/11 0701 - 06/12 0700 In: 3572.5 [I.V.:3572.5] Out: 1450 [Urine:800; Emesis/NG output:650] Intake/Output this shift: No intake/output data recorded.  PE: Gen:  Alert, NAD, pleasant HEENT: EOM's intact, pupils equal and round Card:  RRR, no M/G/R heard Pulm:  CTAB, no W/R/R, effort normal Abd: Soft, NT/ND, +BS, no HSM, no hernia Ext:  Calves soft and nontender Psych: A&Ox3  Skin: no rashes noted, warm and dry  Lab Results:  Recent Labs    04/12/18 0426 04/13/18 0435  WBC 4.3 3.4*  HGB 11.5* 11.1*  HCT 35.2* 34.2*  PLT 172 148*   BMET Recent Labs    04/12/18 0426 04/13/18 0435  NA 143 144  K 3.9 3.7  CL 112* 112*  CO2 18* 24  GLUCOSE 62* 100*  BUN 21* 19  CREATININE 1.11* 1.08*  CALCIUM 9.3 9.3   PT/INR No results for input(s): LABPROT, INR in the last 72 hours. CMP     Component Value Date/Time   NA 144 04/13/2018 0435   NA 135 12/07/2017 1427   NA 141 02/19/2017 1111   K 3.7 04/13/2018 0435   K 4.3 02/19/2017 1111   CL 112 (H) 04/13/2018 0435   CO2 24 04/13/2018 0435   CO2 26 02/19/2017 1111   GLUCOSE 100 (H) 04/13/2018 0435   GLUCOSE 72 02/19/2017 1111   BUN 19 04/13/2018 0435   BUN 20 12/07/2017 1427   BUN 16.7 02/19/2017 1111    CREATININE 1.08 (H) 04/13/2018 0435   CREATININE 0.9 02/19/2017 1111   CALCIUM 9.3 04/13/2018 0435   CALCIUM 10.2 02/19/2017 1111   PROT 6.4 (L) 04/13/2018 0435   PROT 7.3 08/23/2017 1542   PROT 7.5 02/19/2017 1111   ALBUMIN 3.5 04/13/2018 0435   ALBUMIN 4.1 08/23/2017 1542   ALBUMIN 3.8 02/19/2017 1111   AST 22 04/13/2018 0435   AST 14 02/19/2017 1111   ALT 15 04/13/2018 0435   ALT 8 02/19/2017 1111   ALKPHOS 45 04/13/2018 0435   ALKPHOS 92 02/19/2017 1111   BILITOT 0.5 04/13/2018 0435   BILITOT 0.3 08/23/2017 1542   BILITOT 0.36 02/19/2017 1111   GFRNONAA 45 (L) 04/13/2018 0435   GFRAA 52 (L) 04/13/2018 0435   Lipase     Component Value Date/Time   LIPASE 47 04/08/2018 1430       Studies/Results: Dg Abd 1 View  Result Date: 04/13/2018 CLINICAL DATA:  Abdominal distension EXAM: ABDOMEN - 1 VIEW COMPARISON:  April 12, 2018 FINDINGS: Nasogastric tube tip and side port are in the stomach. There is moderate stool throughout the colon. There is no bowel dilatation or air-fluid level to suggest bowel obstruction. No evident free air. There is degenerative change in the lumbar spine. There is postoperative change in the left  proximal femur. IMPRESSION: Nasogastric tube tip and side port in stomach. No bowel obstruction or free air demonstrable. Moderate stool in colon. Electronically Signed   By: Lowella Grip III M.D.   On: 04/13/2018 07:15   Dg Abd 1 View  Result Date: 04/12/2018 CLINICAL DATA:  Abdominal distension EXAM: ABDOMEN - 1 VIEW COMPARISON:  04/11/2018 FINDINGS: Scattered large and small bowel gas is noted. Fecal material is noted within the right colon. Nasogastric catheter is seen within the stomach. No obstructive changes are noted. The overall appearance is similar to that seen on prior exam. IMPRESSION: Stable appearance of the abdomen when compared from the previous day. Electronically Signed   By: Inez Catalina M.D.   On: 04/12/2018 08:54     Anti-infectives: Anti-infectives (From admission, onward)   Start     Dose/Rate Route Frequency Ordered Stop   04/08/18 1715  vancomycin (VANCOCIN) 1,250 mg in sodium chloride 0.9 % 250 mL IVPB  Status:  Discontinued     1,250 mg 166.7 mL/hr over 90 Minutes Intravenous  Once 04/08/18 1711 04/08/18 1732   04/08/18 1715  cefTRIAXone (ROCEPHIN) 1 g in sodium chloride 0.9 % 100 mL IVPB  Status:  Discontinued     1 g 200 mL/hr over 30 Minutes Intravenous  Once 04/08/18 1711 04/08/18 1732   04/08/18 1715  ampicillin (OMNIPEN) 1 g in sodium chloride 0.9 % 100 mL IVPB  Status:  Discontinued     1 g 300 mL/hr over 20 Minutes Intravenous  Once 04/08/18 1711 04/08/18 1728       Assessment/Plan HTN CKD-III GERD Small B-cell lymphoma Normocytic anemia  SBO, Constipation - PMH sig colectomy for volvulus, PMH ex lap 2015 for SBO - NG tube clamped 6/11, no n/v and having bowel function - xray this AM shows no bowel obstruction or free air, moderate stool in colon  ID - none currently FEN - NG tube clamped, CLD VTE - SCDs, lovenox Foley - wick Follow up - TBD  Plan - Advance to clear liquids. I will recheck patient later and d/c NG tube if tolerating this well. Enema this morning for constipation. Encourage OOB/to chair again today. Consult PT.   LOS: 5 days    Wellington Hampshire , [facility] 04/13/2018, 8:45 AM Pager: [phone] Consults: [phone] Mon 7:00 am -11:30 AM Tues-Fri 7:00 am-4:30 pm Sat-Sun 7:00 am-11:30 am

## 2018-04-13 NOTE — Progress Notes (Signed)
SMOG enema administered as per order, moderate amount of formed stool returned.  Will continue to monitor.

## 2018-04-13 NOTE — Progress Notes (Signed)
PT Cancellation Note  Patient Details Name: Erika Day MRN: [id number] DOB: 01-31-1930   Cancelled Treatment:    Reason Eval/Treat Not Completed: Medical issues which prohibited therapy, up with nursing, then back to bed  And fatigued. Check back tomorrow.   Claretha Cooper 04/13/2018, 4:40 PM  Tresa Endo PT [phone]

## 2018-04-14 LAB — GLUCOSE, CAPILLARY
GLUCOSE-CAPILLARY: 126 mg/dL — AB (ref 65–99)
GLUCOSE-CAPILLARY: 127 mg/dL — AB (ref 65–99)
GLUCOSE-CAPILLARY: 137 mg/dL — AB (ref 65–99)
GLUCOSE-CAPILLARY: 144 mg/dL — AB (ref 65–99)
Glucose-Capillary: 104 mg/dL — ABNORMAL HIGH (ref 65–99)
Glucose-Capillary: 107 mg/dL — ABNORMAL HIGH (ref 65–99)
Glucose-Capillary: 108 mg/dL — ABNORMAL HIGH (ref 65–99)
Glucose-Capillary: 160 mg/dL — ABNORMAL HIGH (ref 65–99)

## 2018-04-14 MED ORDER — METOPROLOL TARTRATE 5 MG/5ML IV SOLN
2.5000 mg | Freq: Once | INTRAVENOUS | Status: AC
  Administered 2018-04-14: 2.5 mg via INTRAVENOUS
  Filled 2018-04-14: qty 5

## 2018-04-14 NOTE — Progress Notes (Signed)
Patient removed NGT. Refused to allow RN to reinsert. Will continue to monitor and follow the POC.

## 2018-04-14 NOTE — Progress Notes (Signed)
[facility] Progress Note     Subjective: CC:  Abdominal pain improved. Had a large BM after enema yesterday, followed later by a small BM. Reports having flatus this morning. States she pulled her NG tube out this morning because it was aggravating her throat. Since that time she has had an episode of emesis.   Objective: Vital signs in last 24 hours: Temp:  [97.4 F (36.3 C)-97.5 F (36.4 C)] 97.5 F (36.4 C) (06/12 2040) Pulse Rate:  [78-99] 84 (06/13 0433) Resp:  [18-22] 18 (06/13 0433) BP: (124-139)/(76-90) 139/85 (06/13 0433) SpO2:  [95 %-100 %] 96 % (06/13 0433) Last BM Date: 04/13/18  Intake/Output from previous day: 06/12 0701 - 06/13 0700 In: 2211.7 [P.O.:360; I.V.:1500; IV Piggyback:351.7] Out: -  Intake/Output this shift: No intake/output data recorded.  PE: Gen:  Alert, NAD, pleasant and cooperative  Card:  Regular rate and rhythm, pedal pulses 2+ BL Pulm:  Normal effort, clear to auscultation bilaterally Abd: Soft, non-tender, non-distended, bowel sounds present in all 4 quadrants Skin: warm and dry, no rashes  Psych: A&Ox3   Lab Results:  Recent Labs    04/12/18 0426 04/13/18 0435  WBC 4.3 3.4*  HGB 11.5* 11.1*  HCT 35.2* 34.2*  PLT 172 148*   BMET Recent Labs    04/12/18 0426 04/13/18 0435  NA 143 144  K 3.9 3.7  CL 112* 112*  CO2 18* 24  GLUCOSE 62* 100*  BUN 21* 19  CREATININE 1.11* 1.08*  CALCIUM 9.3 9.3   PT/INR No results for input(s): LABPROT, INR in the last 72 hours. CMP     Component Value Date/Time   NA 144 04/13/2018 0435   NA 135 12/07/2017 1427   NA 141 02/19/2017 1111   K 3.7 04/13/2018 0435   K 4.3 02/19/2017 1111   CL 112 (H) 04/13/2018 0435   CO2 24 04/13/2018 0435   CO2 26 02/19/2017 1111   GLUCOSE 100 (H) 04/13/2018 0435   GLUCOSE 72 02/19/2017 1111   BUN 19 04/13/2018 0435   BUN 20 12/07/2017 1427   BUN 16.7 02/19/2017 1111   CREATININE 1.08 (H) 04/13/2018 0435   CREATININE 0.9 02/19/2017  1111   CALCIUM 9.3 04/13/2018 0435   CALCIUM 10.2 02/19/2017 1111   PROT 6.4 (L) 04/13/2018 0435   PROT 7.3 08/23/2017 1542   PROT 7.5 02/19/2017 1111   ALBUMIN 3.5 04/13/2018 0435   ALBUMIN 4.1 08/23/2017 1542   ALBUMIN 3.8 02/19/2017 1111   AST 22 04/13/2018 0435   AST 14 02/19/2017 1111   ALT 15 04/13/2018 0435   ALT 8 02/19/2017 1111   ALKPHOS 45 04/13/2018 0435   ALKPHOS 92 02/19/2017 1111   BILITOT 0.5 04/13/2018 0435   BILITOT 0.3 08/23/2017 1542   BILITOT 0.36 02/19/2017 1111   GFRNONAA 45 (L) 04/13/2018 0435   GFRAA 52 (L) 04/13/2018 0435   Lipase     Component Value Date/Time   LIPASE 47 04/08/2018 1430   Studies/Results: Dg Abd 1 View  Result Date: 04/13/2018 CLINICAL DATA:  Abdominal distension EXAM: ABDOMEN - 1 VIEW COMPARISON:  April 12, 2018 FINDINGS: Nasogastric tube tip and side port are in the stomach. There is moderate stool throughout the colon. There is no bowel dilatation or air-fluid level to suggest bowel obstruction. No evident free air. There is degenerative change in the lumbar spine. There is postoperative change in the left proximal femur. IMPRESSION: Nasogastric tube tip and side port in stomach. No bowel obstruction or  free air demonstrable. Moderate stool in colon. Electronically Signed   By: Lowella Grip III M.D.   On: 04/13/2018 07:15   Anti-infectives: Anti-infectives (From admission, onward)   Start     Dose/Rate Route Frequency Ordered Stop   04/08/18 1715  vancomycin (VANCOCIN) 1,250 mg in sodium chloride 0.9 % 250 mL IVPB  Status:  Discontinued     1,250 mg 166.7 mL/hr over 90 Minutes Intravenous  Once 04/08/18 1711 04/08/18 1732   04/08/18 1715  cefTRIAXone (ROCEPHIN) 1 g in sodium chloride 0.9 % 100 mL IVPB  Status:  Discontinued     1 g 200 mL/hr over 30 Minutes Intravenous  Once 04/08/18 1711 04/08/18 1732   04/08/18 1715  ampicillin (OMNIPEN) 1 g in sodium chloride 0.9 % 100 mL IVPB  Status:  Discontinued     1 g 300 mL/hr over  20 Minutes Intravenous  Once 04/08/18 1711 04/08/18 1728     Assessment/Plan  SBO - PMH sig colectomy for volvulus, PMH ex lap 2015 for SBO - now having bowel function - NG tube removed by patient this morning; one episode large-volume emesis after this - continue sips of clears and observe. Abdominal exam benign with good bowel sounds and patient having bowel movements. If she continues to have emesis she may require replacement of NG tube.     LOS: 6 days    Jill Alexanders , [facility] 04/14/2018, 7:51 AM Pager: [phone] Consults: [phone] Mon-Fri 7:00 am-4:30 pm Sat-Sun 7:00 am-11:30 am

## 2018-04-14 NOTE — Progress Notes (Signed)
PT Cancellation Note  Patient Details Name: Erika Day MRN: [id number] DOB: October 16, 1930   Cancelled Treatment:    Reason Eval/Treat Not Completed: Medical issues which prohibited therapy, RN reports that patient vomited a large amount. Will check back another time.    Claretha Cooper 04/14/2018, 11:23 AM Tresa Endo PT [phone]

## 2018-04-14 NOTE — Progress Notes (Signed)
Pt HR sustaining 130s. MD notified. Will report to nightshift nurse.

## 2018-04-14 NOTE — Progress Notes (Signed)
PROGRESS NOTE    Erika Day  YTK:[id number] DOB: Jul 31, 1930 DOA: 04/08/2018 PCP: Mack Hook, MD   Brief Narrative:  84 year old female with a history of marginal zone lymphoma, MGUS,diabetes, hypertension,previous history of sigmoid colectomy due to second recurrence of sigmoid volvulus in 2017,who presented to the ER today with chief complaint of right upper quadrant abdominal pain associated with an episode of vomiting and diarrhea. Patient describes a feeling of being constipated. CT abdomen pelvis shows multiple dilated small bowel loops in the abdomen and pelvis, suspicious for developing small bowel obstruction. Surgery was consulted and NGT Placed and patient undewent Small Bowel Protocol. Pt reported having BMs.    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)   Sigmoid volvulus s/p lap sigmoid colectomy 06/30/2016   Volvulus of sigmoid colon (HCC)   MGUS (monoclonal gammopathy of unknown significance)  Acute Small Bowel Obstruction (HCC) Has Extensive prior surgical history,  including history of recurrent sigmoid volvulus, status post sigmoid colectomy Pt removed NGT, with emesis this am General Surgery on board, advance diet and monitor closely IVF @ 75 mL/hr Supportive Care and pain control with IV fentanyl 12.5 to 25 mcg every 2 hours PRN for moderate and severe pain increased to 25-50 mcg q2h Bisacodyl 10 mg suppositories  Essential Hypertension Hold Home Lisinopril Hydralazine IV 10 mg every 6 PRN  Osteoarthritis -Patient is on when necessary Robaxin at home -Can continue Acetaminophen 650 mg po/RC    Thrombocytopenia, improved  Daily CBC   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 / CLL/ MGUS -Patient is followed by  Dr.Gorsuch  CKD Stage  3 -Stable and improving slightly with IVF Rehydration  -Avoid Nephrotoxic Medications if possible and hold Lisinopril -Repeat CMP in AM   GERD -Continue Famotidine IV 20 mg q12h  Leukopenia, improved Monitor CBC   Normocytic Anemia of ckd Stable Monitor CBC  Non-Gap Metabolic Acidosis Resolved    DVT prophylaxis: Enoxaparin 40 mg sq q24h Code Status: FULL CODE Family Communication: No family present at bedside  Disposition Plan: Remain Inpatient for continued workup and Treatment and Anticipate D/C in next 24-48 hours if improved and able to tolerate Diet  Consultants:   General Surgery    Procedures: None   Antimicrobials:  Anti-infectives (From admission, onward)   Start     Dose/Rate Route Frequency Ordered Stop   04/08/18 1715  vancomycin (VANCOCIN) 1,250 mg in sodium chloride 0.9 % 250 mL IVPB  Status:  Discontinued     1,250 mg 166.7 mL/hr over 90 Minutes Intravenous  Once 04/08/18 1711 04/08/18 1732   04/08/18 1715  cefTRIAXone (ROCEPHIN) 1 g in sodium chloride 0.9 % 100 mL IVPB  Status:  Discontinued     1 g 200 mL/hr over 30 Minutes Intravenous  Once 04/08/18 1711 04/08/18 1732   04/08/18 1715  ampicillin (OMNIPEN) 1 g in sodium chloride 0.9 % 100 mL IVPB  Status:  Discontinued     1 g 300 mL/hr over 20 Minutes Intravenous  Once 04/08/18 1711 04/08/18 1728     Subjective: No new complaints. Noted to have an episode of emesis. Tolerating clear liquids. Having bm  Objective: Vitals:   04/13/18 1413 04/13/18 2040 04/14/18 0433 04/14/18 1251  BP: 125/90 124/76 139/85 (!) 142/81  Pulse: 99 78  84 70  Resp: (!) 22 18 18 18   Temp: (!) 97.4 F (36.3 C) (!) 97.5 F (36.4 C)  (!) 97.5 F (36.4 C)  TempSrc: Oral   Oral  SpO2: 100% 95% 96% 99%  Weight:      Height:        Intake/Output Summary (Last 24 hours) at 04/14/2018 1842 Last data filed at 04/14/2018 0200 Gross per 24 hour  Intake 951.67 ml  Output -  Net 951.67 ml   Filed Weights   04/08/18  1249 04/08/18 2000  Weight: 63 kg (139 lb) 72 kg (158 lb 11.7 oz)   Examination: Physical Exam:  Constitutional: Not in any distress.   Eyes: PERRLA.  No jaundice.   ENMT: External ears and nose appear normal.  Has NG tube in place Neck: Appears supple with no JVD Respiratory: Diminished to auscultation bilaterally with no appreciable wheezing, rales, rhonchi. Cardiovascular: Regular rate and rhythm; Has a 2/6 Systolic Murmur.  No lower extremity edema noted  Abdomen: Soft, nontender to palpate.  Distention is markedly improved.  Bowel sounds are present however are slightly diminished Musculoskeletal: No contractures or cyanosis noted.  No joint deformities Skin: Skin is warm and dry with no appreciable rashes or lesions on limited skin evaluation Neurologic: Awake, alert and oriented to time, place and person.  Patient moves all limbs.   Psychiatric: Normal mood and affect.  Intact judgment intact.  Patient is awake alert and oriented x3  Data Reviewed: I have personally reviewed following labs and imaging studies  CBC: Recent Labs  Lab 04/08/18 1430 04/09/18 0440 04/10/18 0358 04/11/18 0435 04/12/18 0426 04/13/18 0435  WBC 4.2 3.4* 3.2* 3.6* 4.3 3.4*  NEUTROABS 3.0  --  2.3 2.6 3.2 2.2  HGB 12.2 12.5 13.1 11.8* 11.5* 11.1*  HCT 36.4 37.2 38.5 35.9* 35.2* 34.2*  MCV 87.9 88.2 86.1 88.2 89.8 88.6  PLT 161 129* 141* 168 172 175*   Basic Metabolic Panel: Recent Labs  Lab 04/09/18 0440 04/10/18 0358 04/11/18 0435 04/12/18 0426 04/13/18 0435  NA 136 138 141 143 144  K 4.4 4.4 4.1 3.9 3.7  CL 100* 105 109 112* 112*  CO2 25 20* 21* 18* 24  GLUCOSE 108* 79 68 62* 100*  BUN 19 21* 22* 21* 19  CREATININE 1.20* 1.15* 1.13* 1.11* 1.08*  CALCIUM 9.8 9.5 9.4 9.3 9.3  MG 1.8 1.9 2.0 2.0 2.0  PHOS 3.8 3.9 3.3 2.7 2.0*   GFR: Estimated Creatinine Clearance: 36.4 mL/min (A) (by C-G formula based on SCr of 1.08 mg/dL (H)). Liver Function Tests: Recent Labs  Lab  04/09/18 0440 04/10/18 0358 04/11/18 0435 04/12/18 0426 04/13/18 0435  AST 22 27 25 25 22   ALT 11* 15 14 16 15   ALKPHOS 57 59 53 48 45  BILITOT 0.6 1.0 0.7 0.9 0.5  PROT 7.2 7.1 6.7 6.4* 6.4*  ALBUMIN 4.0 3.9 3.7 3.5 3.5   Recent Labs  Lab 04/08/18 1430  LIPASE 47   No results for input(s): AMMONIA in the last 168 hours. Coagulation Profile: No results for input(s): INR, PROTIME in the last 168 hours. Cardiac Enzymes: No results for input(s): CKTOTAL, CKMB, CKMBINDEX, TROPONINI in the last 168 hours. BNP (last 3 results) No results for input(s): PROBNP in the last 8760 hours. HbA1C: No results for input(s): HGBA1C in the last 72 hours. CBG: Recent Labs  Lab 04/14/18 0017 04/14/18 0431 04/14/18 0803 04/14/18 1147 04/14/18 1634  GLUCAP 127* 144* 137* 126* 107*  Lipid Profile: No results for input(s): CHOL, HDL, LDLCALC, TRIG, CHOLHDL, LDLDIRECT in the last 72 hours. Thyroid Function Tests: No results for input(s): TSH, T4TOTAL, FREET4, T3FREE, THYROIDAB in the last 72 hours. Anemia Panel: No results for input(s): VITAMINB12, FOLATE, FERRITIN, TIBC, IRON, RETICCTPCT in the last 72 hours. Sepsis Labs: No results for input(s): PROCALCITON, LATICACIDVEN in the last 168 hours.  No results found for this or any previous visit (from the past 240 hour(s)).   Radiology Studies: Dg Abd 1 View  Result Date: 04/13/2018 CLINICAL DATA:  Abdominal distension EXAM: ABDOMEN - 1 VIEW COMPARISON:  April 12, 2018 FINDINGS: Nasogastric tube tip and side port are in the stomach. There is moderate stool throughout the colon. There is no bowel dilatation or air-fluid level to suggest bowel obstruction. No evident free air. There is degenerative change in the lumbar spine. There is postoperative change in the left proximal femur. IMPRESSION: Nasogastric tube tip and side port in stomach. No bowel obstruction or free air demonstrable. Moderate stool in colon. Electronically Signed   By:  Lowella Grip III M.D.   On: 04/13/2018 07:15   Scheduled Meds: . enoxaparin (LOVENOX) injection  40 mg Subcutaneous Q24H  . fluticasone  2 spray Each Nare Daily  . loteprednol  1 drop Left Eye Daily   Continuous Infusions: . dextrose 75 mL/hr at 04/14/18 1320  . famotidine (PEPCID) IV Stopped (04/14/18 1333)    LOS: 6 days   Adline Peals [place],MD [facility]  If 7PM-7AM, please contact night-coverage [email or web address] Password [facility] 04/14/2018, 6:42 PM

## 2018-04-15 ENCOUNTER — Inpatient Hospital Stay (HOSPITAL_COMMUNITY): Payer: Medicare Other

## 2018-04-15 ENCOUNTER — Encounter (HOSPITAL_COMMUNITY): Payer: Self-pay | Admitting: Radiology

## 2018-04-15 LAB — GLUCOSE, CAPILLARY
GLUCOSE-CAPILLARY: 105 mg/dL — AB (ref 65–99)
GLUCOSE-CAPILLARY: 127 mg/dL — AB (ref 65–99)
GLUCOSE-CAPILLARY: 100 mg/dL — AB (ref 65–99)
Glucose-Capillary: 116 mg/dL — ABNORMAL HIGH (ref 65–99)
Glucose-Capillary: 82 mg/dL (ref 65–99)
Glucose-Capillary: 96 mg/dL (ref 65–99)

## 2018-04-15 LAB — BASIC METABOLIC PANEL
ANION GAP: 6 (ref 5–15)
BUN: 22 mg/dL — ABNORMAL HIGH (ref 6–20)
CHLORIDE: 102 mmol/L (ref 101–111)
CO2: 25 mmol/L (ref 22–32)
Calcium: 9.2 mg/dL (ref 8.9–10.3)
Creatinine, Ser: 1.11 mg/dL — ABNORMAL HIGH (ref 0.44–1.00)
GFR calc non Af Amer: 44 mL/min — ABNORMAL LOW (ref >60)
GFR, EST AFRICAN AMERICAN: 51 mL/min — AB (ref >60)
Glucose, Bld: 120 mg/dL — ABNORMAL HIGH (ref 65–99)
Potassium: 4 mmol/L (ref 3.5–5.1)
SODIUM: 133 mmol/L — AB (ref 135–145)

## 2018-04-15 LAB — CBC WITH DIFFERENTIAL/PLATELET
BASOS ABS: 0 10*3/uL (ref 0.0–0.1)
Basophils Relative: 0 %
Eosinophils Absolute: 0.1 10*3/uL (ref 0.0–0.7)
Eosinophils Relative: 2 %
HCT: 37.6 % (ref 36.0–46.0)
HEMOGLOBIN: 12.3 g/dL (ref 12.0–15.0)
LYMPHS ABS: 0.9 10*3/uL (ref 0.7–4.0)
LYMPHS PCT: 22 %
MCH: 29 pg (ref 26.0–34.0)
MCHC: 32.7 g/dL (ref 30.0–36.0)
MCV: 88.7 fL (ref 78.0–100.0)
Monocytes Absolute: 0.4 10*3/uL (ref 0.1–1.0)
Monocytes Relative: 10 %
Neutro Abs: 2.9 10*3/uL (ref 1.7–7.7)
Neutrophils Relative %: 66 %
PLATELETS: 156 10*3/uL (ref 150–400)
RBC: 4.24 MIL/uL (ref 3.87–5.11)
RDW: 13.2 % (ref 11.5–15.5)
WBC: 4.3 10*3/uL (ref 4.0–10.5)

## 2018-04-15 LAB — MAGNESIUM: MAGNESIUM: 1.8 mg/dL (ref 1.7–2.4)

## 2018-04-15 MED ORDER — IOPAMIDOL (ISOVUE-300) INJECTION 61%
INTRAVENOUS | Status: AC
  Administered 2018-04-15: 16:00:00
  Filled 2018-04-15: qty 30

## 2018-04-15 MED ORDER — IOPAMIDOL (ISOVUE-300) INJECTION 61%
100.0000 mL | Freq: Once | INTRAVENOUS | Status: AC | PRN
  Administered 2018-04-15: 100 mL via INTRAVENOUS

## 2018-04-15 MED ORDER — BOOST / RESOURCE BREEZE PO LIQD CUSTOM
1.0000 | Freq: Three times a day (TID) | ORAL | Status: DC
  Administered 2018-04-15: 1 via ORAL

## 2018-04-15 MED ORDER — FENTANYL CITRATE (PF) 100 MCG/2ML IJ SOLN
25.0000 ug | Freq: Once | INTRAMUSCULAR | Status: AC
  Administered 2018-04-15: 25 ug via INTRAVENOUS
  Filled 2018-04-15: qty 2

## 2018-04-15 MED ORDER — IOPAMIDOL (ISOVUE-300) INJECTION 61%
INTRAVENOUS | Status: AC
  Filled 2018-04-15: qty 100

## 2018-04-15 MED ORDER — IOPAMIDOL (ISOVUE-300) INJECTION 61%: 30.0000 mL | Freq: Once | INTRAVENOUS | Status: DC | PRN

## 2018-04-15 NOTE — Progress Notes (Signed)
PROGRESS NOTE    Erika Day  MBT:[id number] DOB: Oct 21, 1930 DOA: 04/08/2018 PCP: Mack Hook, MD   Brief Narrative:  84 year old female with a history of marginal zone lymphoma, MGUS,diabetes, hypertension,previous history of sigmoid colectomy due to second recurrence of sigmoid volvulus in 2017,who presented to the ER with chief complaint of right upper quadrant abdominal pain associated with an episode of vomiting and diarrhea. Patient describes a feeling of being constipated. CT abdomen pelvis shows multiple dilated small bowel loops in the abdomen and pelvis, suspicious for developing small bowel obstruction. Surgery was consulted and NGT Placed and patient undewent Small Bowel Protocol. Pt admitted for further management.    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  Has Extensive prior surgical history,  including history of recurrent sigmoid volvulus, status post sigmoid colectomy Pt removed NGT on 04/14/18 General Surgery on board: due to new findings of 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, UGI series with SBFT will be done to better evaluate  Advance diet and monitor closely Continue IVF Supportive care and pain control with IV fentanyl 12.5 to 25 mcg every 2 hours PRN for moderate and severe pain increased to 25-50 mcg q2h Bisacodyl 10 mg suppositories  Essential Hypertension Hold Home Lisinopril Hydralazine IV 10 mg every 6 PRN  Osteoarthritis Continue home robaxin prn Continue Acetaminophen 650 mg   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 / CLL/  MGUS Patient is followed by  Dr.Gorsuch  CKD Stage 3 Stable Avoid nephrotoxic medications if possible and hold Lisinopril Daily BMP  GERD Continue Famotidine IV 20 mg q12h  Leukopenia, improved Monitor CBC   Normocytic Anemia of ckd Stable Monitor CBC  Non-Gap Metabolic Acidosis Resolved    DVT prophylaxis: Enoxaparin 40 mg sq q24h Code Status: FULL CODE Family Communication: No family present at bedside  Disposition Plan: Remain Inpatient for continued workup and Treatment  Consultants:   General Surgery    Procedures: None   Antimicrobials:  Anti-infectives (From admission, onward)   Start     Dose/Rate Route Frequency Ordered Stop   04/08/18 1715  vancomycin (VANCOCIN) 1,250 mg in sodium chloride 0.9 % 250 mL IVPB  Status:  Discontinued     1,250 mg 166.7 mL/hr over 90 Minutes Intravenous  Once 04/08/18 1711 04/08/18 1732   04/08/18 1715  cefTRIAXone (ROCEPHIN) 1 g in sodium chloride 0.9 % 100 mL IVPB  Status:  Discontinued     1 g 200 mL/hr over 30 Minutes Intravenous  Once 04/08/18 1711 04/08/18 1732   04/08/18 1715  ampicillin (OMNIPEN) 1 g in sodium chloride 0.9 % 100 mL IVPB  Status:  Discontinued     1 g 300 mL/hr over 20 Minutes Intravenous  Once 04/08/18 1711 04/08/18 1728     Subjective: Today, met pt sitting up in chair eating breakfast. Denies any new complaints, except for some gas pains in her abd. Passing gas.  Objective: Vitals:   04/14/18 2018 04/15/18 0515 04/15/18 1353 04/15/18 1353  BP: 135/71 137/70  120/77  Pulse: 78 80  75  Resp: _0 Temp: 98.3 F (  36.8 C) 98 F (36.7 C) 98.7 F (37.1 C)   TempSrc:  Oral Axillary   SpO2: 100% 100%  100%  Weight:      Height:        Intake/Output Summary (Last 24 hours) at 04/15/2018 1657 Last data filed at 04/15/2018 1229 Gross per 24 hour  Intake 540 ml  Output 850 ml  Net -310 ml   Filed Weights   04/08/18 1249 04/08/18 2000  Weight: 63 kg (139 lb) 72 kg (158 lb 11.7 oz)    Examination: Physical Exam:  Constitutional: Not in any distress.   Eyes: PERRLA.  No jaundice.   ENMT: External ears and nose appear normal Neck: Appears supple with no JVD Respiratory: Diminished to auscultation bilaterally with no appreciable wheezing, rales, rhonchi. Cardiovascular: Regular rate and rhythm; Has a 2/6 Systolic Murmur.  No lower extremity edema noted  Abdomen: Soft, nontender to palpate.  Distention is markedly improved.  Bowel sounds are present Musculoskeletal: No contractures or cyanosis noted.  No joint deformities Skin: Skin is warm and dry with no appreciable rashes or lesions on limited skin evaluation Neurologic: Awake, alert and oriented to time, place and person.  Patient moves all limbs.   Psychiatric: Normal mood and affect.  Intact judgment intact.  Patient is awake alert and oriented x3  Data Reviewed: I have personally reviewed following labs and imaging studies  CBC: Recent Labs  Lab 04/10/18 0358 04/11/18 0435 04/12/18 0426 04/13/18 0435 04/15/18 0450  WBC 3.2* 3.6* 4.3 3.4* 4.3  NEUTROABS 2.3 2.6 3.2 2.2 2.9  HGB 13.1 11.8* 11.5* 11.1* 12.3  HCT 38.5 35.9* 35.2* 34.2* 37.6  MCV 86.1 88.2 89.8 88.6 88.7  PLT 141* 168 172 148* 967   Basic Metabolic Panel: Recent Labs  Lab 04/09/18 0440 04/10/18 0358 04/11/18 0435 04/12/18 0426 04/13/18 0435 04/15/18 0450  NA 136 138 141 143 144 133*  K 4.4 4.4 4.1 3.9 3.7 4.0  CL 100* 105 109 112* 112* 102  CO2 25 20* 21* 18* 24 25  GLUCOSE 108* 79 68 62* 100* 120*  BUN 19 21* 22* 21* 19 22*  CREATININE 1.20* 1.15* 1.13* 1.11* 1.08* 1.11*  CALCIUM 9.8 9.5 9.4 9.3 9.3 9.2  MG 1.8 1.9 2.0 2.0 2.0 1.8  PHOS 3.8 3.9 3.3 2.7 2.0*  --    GFR: Estimated Creatinine Clearance: 35.4 mL/min (A) (by C-G formula based on SCr of 1.11 mg/dL (H)). Liver Function Tests: Recent Labs  Lab 04/09/18 0440 04/10/18 0358 04/11/18 0435 04/12/18 0426 04/13/18 0435  AST _0 ALT 11* _1 ALKPHOS 57 59 53 48 45  BILITOT 0.6 1.0 0.7 0.9 0.5  PROT 7.2 7.1 6.7 6.4* 6.4*  ALBUMIN 4.0 3.9 3.7 3.5 3.5   No results for input(s): LIPASE, AMYLASE in the last 168 hours. No results for input(s): AMMONIA in the last 168 hours. Coagulation Profile: No results for input(s): INR, PROTIME in the last 168 hours. Cardiac Enzymes: No results for input(s): CKTOTAL, CKMB, CKMBINDEX, TROPONINI in the last 168 hours. BNP (last 3 results) No results for input(s): PROBNP in the last 8760 hours. HbA1C: No results for input(s): HGBA1C in the last 72 hours. CBG: Recent Labs  Lab 04/14/18 2133 04/14/18 2348 04/15/18 0454 04/15/18 0750 04/15/18 1205  GLUCAP 104* 108* 105* 116* 127*   Lipid Profile: No results for input(s): CHOL, HDL, LDLCALC, TRIG, CHOLHDL, LDLDIRECT in the last 72 hours. Thyroid Function Tests: No  results for input(s): TSH, T4TOTAL, FREET4, T3FREE, THYROIDAB in the last 72 hours. Anemia Panel: No results for input(s): VITAMINB12, FOLATE, FERRITIN, TIBC, IRON, RETICCTPCT in the last 72 hours. Sepsis Labs: No results for input(s): PROCALCITON, LATICACIDVEN in the last 168 hours.  No results found for this or any previous visit (from the past 240 hour(s)).   Radiology Studies: Dg Chest Port 1 View  Result Date: 04/15/2018 CLINICAL DATA:  Follow-up CHF and small-bowel obstruction EXAM: PORTABLE CHEST 1 VIEW COMPARISON:  Portable exam 1036 hours compared to 11/23/2016 FINDINGS: Marked elevation of RIGHT diaphragm with significant RIGHT basilar atelectasis. Stable heart size and mediastinal contours. Atherosclerotic calcification aorta. Interval extubation. Minimal atelectasis at LEFT base. Minimal LEFT basilar atelectasis. Upper lungs clear. IMPRESSION: Chronic elevation of RIGHT diaphragm with RIGHT basilar atelectasis. Electronically Signed   By: Lavonia Dana M.D.   On: 04/15/2018 11:15   Dg Abd Portable 1v  Addendum Date: 04/15/2018   ADDENDUM REPORT: 04/15/2018 11:34  ADDENDUM: Study discussed by telephone with Dr. Horris Latino on 04/15/2018 at 1130 hours. Electronically Signed   By: Genevie Ann M.D.   On: 04/15/2018 11:34   Result Date: 04/15/2018 CLINICAL DATA:  84 year old female with small bowel obstruction. EXAM: PORTABLE ABDOMEN - 1 VIEW COMPARISON:  Radiograph 04/13/2018 and earlier. CT Abdomen and Pelvis 04/08/2018, 03/05/2017. FINDINGS: Portable AP supine view at 0927 hours. There remain right upper quadrant and right abdominal dilated small bowel loops measuring 50-56 millimeters diameter. The enteric tube has been removed. This patient has had prior partial sigmoidectomy for volvulus. On 04/08/2018 virtually all of the small bowel was within the right abdomen, a change since 03/05/2017. large bowel-gas pattern remains within normal limits. Left lung base appears stable. Chronic elevation of the right hemidiaphragm. Stable visualized osseous structures. No definite pneumoperitoneum on this supine view. IMPRESSION: 1. Appearance suspicious for a midgut volvulus with obstructed small bowel in the right abdomen. 2. No pneumoperitoneum identified on this supine view. Electronically Signed: By: Genevie Ann M.D. On: 04/15/2018 11:25   Scheduled Meds: . enoxaparin (LOVENOX) injection  40 mg Subcutaneous Q24H  . feeding supplement  1 Container Oral TID BM  . fluticasone  2 spray Each Nare Daily  . iopamidol      . loteprednol  1 drop Left Eye Daily   Continuous Infusions: . famotidine (PEPCID) IV Stopped (04/15/18 1000)    LOS: 7 days   Adline Peals ,MD [facility]  If 7PM-7AM, please contact night-coverage [email or web address] Password [facility] 04/15/2018, 4:57 PM

## 2018-04-15 NOTE — Evaluation (Signed)
Physical Therapy Evaluation Patient Details Name: Erika Day MRN: [id number] DOB: August 30, 1930 Today's Date: 04/15/2018   History of Present Illness  84 yo female admitted with SBO. hx of L femur fx s/p IM nail 2018, DDD, DM, CVA, CHF, lymphoma, colectomy.   Clinical Impression  On eval, pt required Min assist for mobility. She was able to take a few ambulatory steps in room with a RW before pivoting to recliner. Pt c/o 7/10 abd pain. Pt presents with general weakness, decreased activity tolerance, and impaired gait and balance. Recommend HHPT. Will follow and progress activity as tolerated.     Follow Up Recommendations Home health PT;Supervision/Assistance - 24 hour    Equipment Recommendations  None recommended by PT    Recommendations for Other Services       Precautions / Restrictions Precautions Precautions: Fall Restrictions Weight Bearing Restrictions: No      Mobility  Bed Mobility Overal bed mobility: Needs Assistance Bed Mobility: Supine to Sit     Supine to sit: HOB elevated;Min assist     General bed mobility comments: Assist for trunk and scoot to EOB. Pt relied on bedrail. Increased time. Utilized bedpad to help with scooting, positioning.   Transfers Overall transfer level: Needs assistance Equipment used: Rolling walker (2 wheeled) Transfers: Sit to/from [facility] Sit to Stand: Min assist Stand pivot transfers: Min assist       General transfer comment: Assist to rise, stabilize, control descent. VCs safety, technique, hand placement. Stand pivot, bed to recliner with RW  Ambulation/Gait Ambulation/Gait assistance: Min assist Gait Distance (Feet): 3 Feet Assistive device: Rolling walker (2 wheeled) Gait Pattern/deviations: Step-to pattern     General Gait Details: pt took a few ambulatory steps in room with a RW  Stairs            Wheelchair Mobility    Modified Rankin (Stroke Patients Only)       Balance  Overall balance assessment: Needs assistance         Standing balance support: Bilateral upper extremity supported Standing balance-Leahy Scale: Poor                               Pertinent Vitals/Pain Pain Assessment: 0-10 Pain Score: 7  Pain Location: abdomen Pain Descriptors / Indicators: Aching Pain Intervention(s): Monitored during session    Home Living Family/patient expects to be discharged to:: Private residence Living Arrangements: Spouse/significant other;Children Available Help at Discharge: Family(aide 2-4 daily) Type of Home: House Home Access: Ramped entrance     Home Layout: One level Home Equipment: Bedside commode;Wheelchair - [employer] - 2 wheels;Shower seat      Prior Function Level of Independence: Needs assistance   Gait / Transfers Assistance Needed: transfers bed <>wc with assistance.   ADL's / Homemaking Assistance Needed: aide helps with bathing, dressing, meals.         Hand Dominance        Extremity/Trunk Assessment   Upper Extremity Assessment Upper Extremity Assessment: Generalized weakness    Lower Extremity Assessment Lower Extremity Assessment: Generalized weakness    Cervical / Trunk Assessment Cervical / Trunk Assessment: Kyphotic  Communication   Communication: No difficulties  Cognition Arousal/Alertness: Awake/alert Behavior During Therapy: WFL for tasks assessed/performed Overall Cognitive Status: Within Functional Limits for tasks assessed  General Comments      Exercises     Assessment/Plan    PT Assessment Patient needs continued PT services  PT Problem List Decreased strength;Decreased balance;Decreased mobility;Decreased activity tolerance;Decreased knowledge of use of DME;Pain       PT Treatment Interventions DME instruction;Gait training;Functional mobility training;Therapeutic activities;Balance training;Patient/family  education;Therapeutic exercise    PT Goals (Current goals can be found in the Care Plan section)  Acute Rehab PT Goals Patient Stated Goal: home soon. less pain.  PT Goal Formulation: With patient Time For Goal Achievement: 04/29/18 Potential to Achieve Goals: Good    Frequency Min 3X/week   Barriers to discharge        Co-evaluation               AM-PAC PT "6 Clicks" Daily Activity  Outcome Measure Difficulty turning over in bed (including adjusting bedclothes, sheets and blankets)?: A Lot Difficulty moving from lying on back to sitting on the side of the bed? : Unable Difficulty sitting down on and standing up from a chair with arms (e.g., wheelchair, bedside commode, etc,.)?: Unable Help needed moving to and from a bed to chair (including a wheelchair)?: A Little Help needed walking in hospital room?: A Little Help needed climbing 3-5 steps with a railing? : Total 6 Click Score: 11    End of Session Equipment Utilized During Treatment: Gait belt Activity Tolerance: Patient tolerated treatment well Patient left: in chair;with call bell/phone within reach   PT Visit Diagnosis: Muscle weakness (generalized) (M62.81);Difficulty in walking, not elsewhere classified (R26.2)    Time: 6270-3500 PT Time Calculation (min) (ACUTE ONLY): 8 min   Charges:   PT Evaluation $PT Eval Moderate Complexity: 1 Mod     PT G Codes:          Weston Anna, MPT Pager: [phone]

## 2018-04-15 NOTE — Progress Notes (Signed)
Initial Nutrition Assessment  DOCUMENTATION CODES:   Not applicable  INTERVENTION:   Boost Breeze po TID, each supplement provides 250 kcal and 9 grams of protein  NUTRITION DIAGNOSIS:   Inadequate oral intake related to vomiting, nausea as evidenced by meal completion < 50%.  GOAL:   Patient will meet greater than or equal to 90% of their needs  MONITOR:   PO intake, Supplement acceptance, Weight trends, Labs, Diet advancement  REASON FOR ASSESSMENT:   NPO/Clear Liquid Diet   ASSESSMENT:   Patient with PMH significant for marginal zone lymphoma, MGUS, DM, HTN, and s/p sigmoid colectomy due to second recurrence of sigmoid volvulus in 2017. Presents this admission with nausea, vomiting, and right upper quadrant pain. CT abdomen pelvis shows multiple dilated small bowel loops in the abdomen and pelvis.   6/7- NGT placed 6/13- NGT removed by pt 6/14- CT scan shows suspicion for a midgut volvulus with obstructed small bowel in the right abdomen.  Pt has been on a clear liquid diet since 6/7. Spoke with RN who reports pt had jello and broth this morning without any complication. She had one small episode of vomiting early this morning but this has since resolved. RD to provide supplementation to maximize calories and protein while on clear liquid diet. Pt sleeping upon assessment. Will try to obtain history on a later date if possible.   Pt unable to provide UBW. Records indicate pt weighed 146 lb 08/16/17 and 158 lb this admission. Fluid may be masking losses. Nutrition-Focused physical exam completed.   Medications reviewed.  Labs reviewed: Na 133 (L)   NUTRITION - FOCUSED PHYSICAL EXAM:    Most Recent Value  Orbital Region  No depletion  Upper Arm Region  Unable to assess  Thoracic and Lumbar Region  Unable to assess  Buccal Region  No depletion  Temple Region  Moderate depletion  Clavicle Bone Region  Moderate depletion  Clavicle and Acromion Bone Region  Mild  depletion  Scapular Bone Region  Unable to assess  Dorsal Hand  Mild depletion  Patellar Region  Moderate depletion  Anterior Thigh Region  Moderate depletion  Posterior Calf Region  Moderate depletion  Edema (RD Assessment)  Unable to assess  Hair  Unable to assess  Eyes  Unable to assess  Mouth  Unable to assess  Skin  Unable to assess  Nails  Unable to assess     Diet Order:   Diet Order           Diet clear liquid Room service appropriate? Yes; Fluid consistency: Thin  Diet effective now          EDUCATION NEEDS:   Not appropriate for education at this time  Skin:  Skin Assessment: Reviewed RN Assessment  Last BM:  04/13/18  Height:   Ht Readings from Last 1 Encounters:  04/08/18 5\' 4"  (1.626 m)    Weight:   Wt Readings from Last 1 Encounters:  04/08/18 158 lb 11.7 oz (72 kg)    Ideal Body Weight:  54.5 kg  BMI:  Body mass index is 27.25 kg/m.  Estimated Nutritional Needs:   Kcal:  1450-1650 kcal  Protein:  70-80 g  Fluid:  >1.4 L/day    Mariana Single RD, LDN Clinical Nutrition Pager # [phone]

## 2018-04-15 NOTE — Progress Notes (Signed)
[facility] Progress Note     Subjective: CC:  States she feels pretty good this morning. C/o some gas pains in her abd. Last episode flatus this AM. Last BM the evening of 6/12. Reports some nausea and low-volume emesis yesterday evening, now resolved.    tachycardia overnight noted - pt denies CP or SOB this AM. HR normal this AM.  Objective: Vital signs in last 24 hours: Temp:  [97.5 F (36.4 C)-98.3 F (36.8 C)] 98 F (36.7 C) (06/14 0515) Pulse Rate:  [70-80] 80 (06/14 0515) Resp:  [18-20] 20 (06/14 0515) BP: (135-142)/(70-81) 137/70 (06/14 0515) SpO2:  [99 %-100 %] 100 % (06/14 0515) Last BM Date: 04/13/18  Intake/Output from previous day: 06/13 0701 - 06/14 0700 In: 1250 [I.V.:1200; IV Piggyback:50] Out: 500 [Urine:500] Intake/Output this shift: No intake/output data recorded.  PE: Gen:  Alert, NAD, pleasant and cooperative  Card:  Regular rate and rhythm, pedal pulses 2+ BL Pulm:  Normal effort, clear to auscultation bilaterally Abd: Soft, non-tender, mild distention, +BS  Skin: warm and dry, no rashes  Psych: A&Ox3   Lab Results:  Recent Labs    04/13/18 0435 04/15/18 0450  WBC 3.4* 4.3  HGB 11.1* 12.3  HCT 34.2* 37.6  PLT 148* 156   BMET Recent Labs    04/13/18 0435 04/15/18 0450  NA 144 133*  K 3.7 4.0  CL 112* 102  CO2 24 25  GLUCOSE 100* 120*  BUN 19 22*  CREATININE 1.08* 1.11*  CALCIUM 9.3 9.2   PT/INR No results for input(s): LABPROT, INR in the last 72 hours. CMP     Component Value Date/Time   NA 133 (L) 04/15/2018 0450   NA 135 12/07/2017 1427   NA 141 02/19/2017 1111   K 4.0 04/15/2018 0450   K 4.3 02/19/2017 1111   CL 102 04/15/2018 0450   CO2 25 04/15/2018 0450   CO2 26 02/19/2017 1111   GLUCOSE 120 (H) 04/15/2018 0450   GLUCOSE 72 02/19/2017 1111   BUN 22 (H) 04/15/2018 0450   BUN 20 12/07/2017 1427   BUN 16.7 02/19/2017 1111   CREATININE 1.11 (H) 04/15/2018 0450   CREATININE 0.9 02/19/2017 1111   CALCIUM 9.2 04/15/2018 0450   CALCIUM 10.2 02/19/2017 1111   PROT 6.4 (L) 04/13/2018 0435   PROT 7.3 08/23/2017 1542   PROT 7.5 02/19/2017 1111   ALBUMIN 3.5 04/13/2018 0435   ALBUMIN 4.1 08/23/2017 1542   ALBUMIN 3.8 02/19/2017 1111   AST 22 04/13/2018 0435   AST 14 02/19/2017 1111   ALT 15 04/13/2018 0435   ALT 8 02/19/2017 1111   ALKPHOS 45 04/13/2018 0435   ALKPHOS 92 02/19/2017 1111   BILITOT 0.5 04/13/2018 0435   BILITOT 0.3 08/23/2017 1542   BILITOT 0.36 02/19/2017 1111   GFRNONAA 44 (L) 04/15/2018 0450   GFRAA 51 (L) 04/15/2018 0450   Lipase     Component Value Date/Time   LIPASE 47 04/08/2018 1430   Studies/Results: No results found.  Anti-infectives: Anti-infectives (From admission, onward)   Start     Dose/Rate Route Frequency Ordered Stop   04/08/18 1715  vancomycin (VANCOCIN) 1,250 mg in sodium chloride 0.9 % 250 mL IVPB  Status:  Discontinued     1,250 mg 166.7 mL/hr over 90 Minutes Intravenous  Once 04/08/18 1711 04/08/18 1732   04/08/18 1715  cefTRIAXone (ROCEPHIN) 1 g in sodium chloride 0.9 % 100 mL IVPB  Status:  Discontinued     1  g 200 mL/hr over 30 Minutes Intravenous  Once 04/08/18 1711 04/08/18 1732   04/08/18 1715  ampicillin (OMNIPEN) 1 g in sodium chloride 0.9 % 100 mL IVPB  Status:  Discontinued     1 g 300 mL/hr over 20 Minutes Intravenous  Once 04/08/18 1711 04/08/18 1728     Assessment/Plan  SBO - PMH sig colectomy for volvulus, PMH ex lap 2015 for SBO - NG tube removed by patient 6/13 -  Having flatus, last BM 6/12; clinically still acting at least partially obstructed given nausea, emesis, mild bloating, gas pain -  AXR pending  -  Mobilize and OOB to chair!   FEN: sips of clears, follow x-ray ID: none VTE: SCD's, Lovenox Foley: external [place] cath       LOS: 7 days    Jill Alexanders , [facility] 04/15/2018, 9:02 AM Pager: [phone] Consults: [phone] Mon-Fri 7:00 am-4:30 pm Sat-Sun 7:00  am-11:30 am

## 2018-04-16 ENCOUNTER — Encounter (HOSPITAL_COMMUNITY): Payer: Self-pay | Admitting: *Deleted

## 2018-04-16 ENCOUNTER — Inpatient Hospital Stay (HOSPITAL_COMMUNITY): Payer: Medicare Other | Admitting: Anesthesiology

## 2018-04-16 ENCOUNTER — Inpatient Hospital Stay (HOSPITAL_COMMUNITY): Payer: Medicare Other

## 2018-04-16 ENCOUNTER — Encounter (HOSPITAL_COMMUNITY)
Admission: EM | Disposition: A | Discharge status: Skilled Nursing Facility | Payer: Self-pay | Source: Home / Self Care | Attending: Internal Medicine

## 2018-04-16 HISTORY — PX: LAPAROTOMY: SHX154

## 2018-04-16 LAB — GLUCOSE, CAPILLARY
GLUCOSE-CAPILLARY: 95 mg/dL (ref 65–99)
Glucose-Capillary: 105 mg/dL — ABNORMAL HIGH (ref 65–99)
Glucose-Capillary: 110 mg/dL — ABNORMAL HIGH (ref 65–99)

## 2018-04-16 LAB — BASIC METABOLIC PANEL
ANION GAP: 7 (ref 5–15)
BUN: 21 mg/dL — AB (ref 6–20)
CHLORIDE: 103 mmol/L (ref 101–111)
CO2: 22 mmol/L (ref 22–32)
Calcium: 9.1 mg/dL (ref 8.9–10.3)
Creatinine, Ser: 1.06 mg/dL — ABNORMAL HIGH (ref 0.44–1.00)
GFR calc Af Amer: 54 mL/min — ABNORMAL LOW (ref >60)
GFR calc non Af Amer: 46 mL/min — ABNORMAL LOW (ref >60)
GLUCOSE: 119 mg/dL — AB (ref 65–99)
POTASSIUM: 3.9 mmol/L (ref 3.5–5.1)
Sodium: 132 mmol/L — ABNORMAL LOW (ref 135–145)

## 2018-04-16 LAB — CBC WITH DIFFERENTIAL/PLATELET
BASOS ABS: 0 10*3/uL (ref 0.0–0.1)
Basophils Relative: 0 %
EOS PCT: 2 %
Eosinophils Absolute: 0.1 10*3/uL (ref 0.0–0.7)
HEMATOCRIT: 38 % (ref 36.0–46.0)
HEMOGLOBIN: 12.7 g/dL (ref 12.0–15.0)
LYMPHS PCT: 21 %
Lymphs Abs: 0.7 10*3/uL (ref 0.7–4.0)
MCH: 29 pg (ref 26.0–34.0)
MCHC: 33.4 g/dL (ref 30.0–36.0)
MCV: 86.8 fL (ref 78.0–100.0)
MONOS PCT: 8 %
Monocytes Absolute: 0.3 10*3/uL (ref 0.1–1.0)
NEUTROS ABS: 2.3 10*3/uL (ref 1.7–7.7)
Neutrophils Relative %: 69 %
Platelets: DECREASED 10*3/uL (ref 150–400)
RBC: 4.38 MIL/uL (ref 3.87–5.11)
RDW: 13.1 % (ref 11.5–15.5)
WBC: 3.4 10*3/uL — AB (ref 4.0–10.5)

## 2018-04-16 LAB — MRSA PCR SCREENING: MRSA by PCR: NEGATIVE

## 2018-04-16 SURGERY — LAPAROTOMY, EXPLORATORY
Anesthesia: General | Site: Abdomen

## 2018-04-16 MED ORDER — FAMOTIDINE IN NACL 20-0.9 MG/50ML-% IV SOLN
20.0000 mg | Freq: Two times a day (BID) | INTRAVENOUS | Status: DC
  Administered 2018-04-16 – 2018-04-19 (×8): 20 mg via INTRAVENOUS
  Filled 2018-04-16 (×8): qty 50

## 2018-04-16 MED ORDER — LABETALOL HCL 5 MG/ML IV SOLN
INTRAVENOUS | Status: DC | PRN
  Administered 2018-04-16: 5 mg via INTRAVENOUS

## 2018-04-16 MED ORDER — ORAL CARE MOUTH RINSE
15.0000 mL | Freq: Two times a day (BID) | OROMUCOSAL | Status: DC
  Administered 2018-04-17 – 2018-04-21 (×8): 15 mL via OROMUCOSAL

## 2018-04-16 MED ORDER — SODIUM CHLORIDE 0.9 % IV BOLUS
500.0000 mL | Freq: Once | INTRAVENOUS | Status: AC
  Administered 2018-04-16: 500 mL via INTRAVENOUS

## 2018-04-16 MED ORDER — PROPOFOL 10 MG/ML IV BOLUS
INTRAVENOUS | Status: AC
  Filled 2018-04-16: qty 20

## 2018-04-16 MED ORDER — ETOMIDATE 2 MG/ML IV SOLN
INTRAVENOUS | Status: DC | PRN
  Administered 2018-04-16 (×2): 14 mg via INTRAVENOUS

## 2018-04-16 MED ORDER — ALBUMIN HUMAN 5 % IV SOLN
INTRAVENOUS | Status: DC | PRN
  Administered 2018-04-16 (×3): via INTRAVENOUS

## 2018-04-16 MED ORDER — LORAZEPAM 2 MG/ML IJ SOLN
0.5000 mg | Freq: Once | INTRAMUSCULAR | Status: AC
  Administered 2018-04-16: 0.5 mg via INTRAVENOUS
  Filled 2018-04-16: qty 1

## 2018-04-16 MED ORDER — MIDAZOLAM HCL 2 MG/2ML IJ SOLN
INTRAMUSCULAR | Status: AC
  Filled 2018-04-16: qty 2

## 2018-04-16 MED ORDER — ACETAMINOPHEN 650 MG RE SUPP: 650.0000 mg | Freq: Four times a day (QID) | RECTAL | Status: DC | PRN

## 2018-04-16 MED ORDER — FENTANYL CITRATE (PF) 100 MCG/2ML IJ SOLN
INTRAMUSCULAR | Status: DC | PRN
  Administered 2018-04-16: 25 ug via INTRAVENOUS
  Administered 2018-04-16: 100 ug via INTRAVENOUS
  Administered 2018-04-16: 25 ug via INTRAVENOUS

## 2018-04-16 MED ORDER — ONDANSETRON HCL 4 MG/2ML IJ SOLN: 4.0000 mg | Freq: Four times a day (QID) | INTRAMUSCULAR | Status: DC | PRN

## 2018-04-16 MED ORDER — SUGAMMADEX SODIUM 200 MG/2ML IV SOLN
INTRAVENOUS | Status: DC | PRN
  Administered 2018-04-16: 200 mg via INTRAVENOUS

## 2018-04-16 MED ORDER — FENTANYL CITRATE (PF) 100 MCG/2ML IJ SOLN
25.0000 ug | INTRAMUSCULAR | Status: DC | PRN
  Administered 2018-04-16 (×2): 12.5 ug via INTRAVENOUS

## 2018-04-16 MED ORDER — SODIUM CHLORIDE 0.9% FLUSH
10.0000 mL | Freq: Two times a day (BID) | INTRAVENOUS | Status: DC
  Administered 2018-04-17: 10 mL

## 2018-04-16 MED ORDER — FENTANYL CITRATE (PF) 250 MCG/5ML IJ SOLN
INTRAMUSCULAR | Status: AC
  Filled 2018-04-16: qty 5

## 2018-04-16 MED ORDER — CHLORHEXIDINE GLUCONATE 0.12 % MT SOLN
15.0000 mL | Freq: Two times a day (BID) | OROMUCOSAL | Status: DC
  Administered 2018-04-16 – 2018-04-21 (×11): 15 mL via OROMUCOSAL
  Filled 2018-04-16 (×12): qty 15

## 2018-04-16 MED ORDER — PROMETHAZINE HCL 25 MG/ML IJ SOLN: 6.2500 mg | INTRAMUSCULAR | Status: DC | PRN

## 2018-04-16 MED ORDER — 0.9 % SODIUM CHLORIDE (POUR BTL) OPTIME
TOPICAL | Status: DC | PRN
  Administered 2018-04-16: 2000 mL

## 2018-04-16 MED ORDER — LACTATED RINGERS IV SOLN
INTRAVENOUS | Status: DC | PRN
  Administered 2018-04-16 (×2): via INTRAVENOUS

## 2018-04-16 MED ORDER — LIDOCAINE 2% (20 MG/ML) 5 ML SYRINGE
INTRAMUSCULAR | Status: DC | PRN
  Administered 2018-04-16: 75 mg via INTRAVENOUS

## 2018-04-16 MED ORDER — ACETAMINOPHEN 325 MG PO TABS: 650.0000 mg | ORAL_TABLET | Freq: Four times a day (QID) | ORAL | Status: DC | PRN

## 2018-04-16 MED ORDER — KCL IN DEXTROSE-NACL 20-5-0.45 MEQ/L-%-% IV SOLN: INTRAVENOUS | Status: DC

## 2018-04-16 MED ORDER — CHLORHEXIDINE GLUCONATE CLOTH 2 % EX PADS
6.0000 | MEDICATED_PAD | Freq: Every day | CUTANEOUS | Status: DC
  Administered 2018-04-17: 6 via TOPICAL

## 2018-04-16 MED ORDER — SUCCINYLCHOLINE CHLORIDE 200 MG/10ML IV SOSY
PREFILLED_SYRINGE | INTRAVENOUS | Status: DC | PRN
  Administered 2018-04-16: 120 mg via INTRAVENOUS

## 2018-04-16 MED ORDER — CHLORHEXIDINE GLUCONATE CLOTH 2 % EX PADS: 6.0000 | MEDICATED_PAD | Freq: Once | CUTANEOUS | Status: DC

## 2018-04-16 MED ORDER — SODIUM CHLORIDE 0.9 % IV SOLN
1.0000 g | INTRAVENOUS | Status: AC
  Administered 2018-04-16: 1 g via INTRAVENOUS
  Filled 2018-04-16: qty 1

## 2018-04-16 MED ORDER — ALBUMIN HUMAN 5 % IV SOLN
INTRAVENOUS | Status: AC
  Filled 2018-04-16: qty 500

## 2018-04-16 MED ORDER — HYDROMORPHONE HCL 1 MG/ML IJ SOLN
1.0000 mg | INTRAMUSCULAR | Status: DC | PRN
  Administered 2018-04-16: 0.5 mg via INTRAVENOUS
  Filled 2018-04-16: qty 1

## 2018-04-16 MED ORDER — ROCURONIUM BROMIDE 10 MG/ML (PF) SYRINGE
PREFILLED_SYRINGE | INTRAVENOUS | Status: DC | PRN
  Administered 2018-04-16: 40 mg via INTRAVENOUS

## 2018-04-16 MED ORDER — ONDANSETRON HCL 4 MG/2ML IJ SOLN
INTRAMUSCULAR | Status: DC | PRN
  Administered 2018-04-16: 4 mg via INTRAVENOUS

## 2018-04-16 MED ORDER — FENTANYL CITRATE (PF) 100 MCG/2ML IJ SOLN
12.5000 ug | INTRAMUSCULAR | Status: DC | PRN
Start: 2018-04-16 — End: 2018-04-18
  Administered 2018-04-17 – 2018-04-18 (×3): 12.5 ug via INTRAVENOUS
  Filled 2018-04-16 (×3): qty 2

## 2018-04-16 MED ORDER — SODIUM CHLORIDE 0.9% FLUSH: 10.0000 mL | INTRAVENOUS | Status: DC | PRN

## 2018-04-16 MED ORDER — CHLORHEXIDINE GLUCONATE CLOTH 2 % EX PADS
6.0000 | MEDICATED_PAD | Freq: Once | CUTANEOUS | Status: DC
  Administered 2018-04-16: 6 via TOPICAL

## 2018-04-16 MED ORDER — SODIUM CHLORIDE 0.9 % IV BOLUS
1000.0000 mL | Freq: Once | INTRAVENOUS | Status: AC
  Administered 2018-04-16: 1000 mL via INTRAVENOUS

## 2018-04-16 MED ORDER — FENTANYL CITRATE (PF) 100 MCG/2ML IJ SOLN
INTRAMUSCULAR | Status: AC
  Filled 2018-04-16: qty 2

## 2018-04-16 MED ORDER — SODIUM CHLORIDE 0.9 % IV SOLN
INTRAVENOUS | Status: DC
  Administered 2018-04-16: 11:00:00 via INTRAVENOUS

## 2018-04-16 MED ORDER — KCL IN DEXTROSE-NACL 20-5-0.45 MEQ/L-%-% IV SOLN
INTRAVENOUS | Status: DC
  Administered 2018-04-16 – 2018-04-18 (×3): via INTRAVENOUS
  Filled 2018-04-16 (×5): qty 1000

## 2018-04-16 MED ORDER — ONDANSETRON 4 MG PO TBDP: 4.0000 mg | ORAL_TABLET | Freq: Four times a day (QID) | ORAL | Status: DC | PRN

## 2018-04-16 MED ORDER — ENOXAPARIN SODIUM 40 MG/0.4ML ~~LOC~~ SOLN
40.0000 mg | SUBCUTANEOUS | Status: DC
  Administered 2018-04-17 – 2018-04-22 (×6): 40 mg via SUBCUTANEOUS
  Filled 2018-04-16 (×7): qty 0.4

## 2018-04-16 SURGICAL SUPPLY — 44 items
BLADE EXTENDED COATED 6.5IN (ELECTRODE) IMPLANT
BLADE HEX COATED 2.75 (ELECTRODE) ×4 IMPLANT
CHLORAPREP W/TINT 26ML (MISCELLANEOUS) ×8 IMPLANT
COVER MAYO STAND STRL (DRAPES) ×3 IMPLANT
COVER SURGICAL LIGHT HANDLE (MISCELLANEOUS) ×8 IMPLANT
DRAPE LAPAROSCOPIC ABDOMINAL (DRAPES) ×4 IMPLANT
DRAPE WARM FLUID 44X44 (DRAPE) IMPLANT
DRSG OPSITE POSTOP 4X10 (GAUZE/BANDAGES/DRESSINGS) IMPLANT
DRSG OPSITE POSTOP 4X6 (GAUZE/BANDAGES/DRESSINGS) IMPLANT
DRSG OPSITE POSTOP 4X8 (GAUZE/BANDAGES/DRESSINGS) ×3 IMPLANT
ELECT PENCIL ROCKER SW 15FT (MISCELLANEOUS) ×4 IMPLANT
ELECT REM PT RETURN 15FT ADLT (MISCELLANEOUS) ×4 IMPLANT
GAUZE SPONGE 4X4 12PLY STRL (GAUZE/BANDAGES/DRESSINGS) ×4 IMPLANT
GLOVE BIOGEL PI IND STRL 7.0 (GLOVE) ×2 IMPLANT
GLOVE BIOGEL PI INDICATOR 7.0 (GLOVE) ×2
GLOVE SURG ORTHO 8.0 STRL STRW (GLOVE) ×8 IMPLANT
GOWN STRL REUS W/TWL LRG LVL3 (GOWN DISPOSABLE) ×4 IMPLANT
GOWN STRL REUS W/TWL XL LVL3 (GOWN DISPOSABLE) ×16 IMPLANT
HANDLE SUCTION POOLE (INSTRUMENTS) ×1 IMPLANT
KIT BASIN OR (CUSTOM PROCEDURE TRAY) ×4 IMPLANT
NS IRRIG 1000ML POUR BTL (IV SOLUTION) ×4 IMPLANT
PACK GENERAL/GYN (CUSTOM PROCEDURE TRAY) ×4 IMPLANT
SPONGE LAP 18X18 RF (DISPOSABLE) IMPLANT
STAPLER VISISTAT 35W (STAPLE) ×4 IMPLANT
SUCTION POOLE HANDLE (INSTRUMENTS) ×4
SUT NOV 1 T60/GS (SUTURE) IMPLANT
SUT NOVA NAB DX-16 0-1 5-0 T12 (SUTURE) ×9 IMPLANT
SUT NOVA T20/GS 25 (SUTURE) IMPLANT
SUT PDS AB 1 TP1 96 (SUTURE) IMPLANT
SUT SILK 2 0 (SUTURE) ×8
SUT SILK 2 0 SH CR/8 (SUTURE) ×4 IMPLANT
SUT SILK 2 0SH CR/8 30 (SUTURE) IMPLANT
SUT SILK 2-0 18XBRD TIE 12 (SUTURE) ×2 IMPLANT
SUT SILK 2-0 30XBRD TIE 12 (SUTURE) ×2 IMPLANT
SUT SILK 3 0 (SUTURE) ×4
SUT SILK 3 0 SH CR/8 (SUTURE) ×4 IMPLANT
SUT SILK 3-0 18XBRD TIE 12 (SUTURE) ×2 IMPLANT
SUT VICRYL 2 0 18  UND BR (SUTURE)
SUT VICRYL 2 0 18 UND BR (SUTURE) IMPLANT
TOWEL OR 17X26 10 PK STRL BLUE (TOWEL DISPOSABLE) ×5 IMPLANT
TOWEL OR NON WOVEN STRL DISP B (DISPOSABLE) ×8 IMPLANT
TRAY FOLEY CATH 14FRSI W/METER (CATHETERS) ×3 IMPLANT
WATER STERILE IRR 1000ML POUR (IV SOLUTION) ×4 IMPLANT
YANKAUER SUCT BULB TIP NO VENT (SUCTIONS) IMPLANT

## 2018-04-16 NOTE — Transfer of Care (Signed)
Immediate Anesthesia Transfer of Care Note  Patient: Erika Day  Procedure(s) Performed: EXPLORATORY LAPAROTOMY WITH LYSIS OF ADHESIONS (N/A Abdomen)  Patient Location: PACU  Anesthesia Type:General  Level of Consciousness: awake and pateint uncooperative  Airway & Oxygen Therapy: Patient Spontanous Breathing and Patient connected to face mask oxygen  Post-op Assessment: Report given to RN, Post -op Vital signs reviewed and stable and Patient moving all extremities X 4  Post vital signs: stable  Last Vitals:  Vitals Value Taken Time  BP 204/183 04/16/2018 12:28 PM  Temp    Pulse 64 04/16/2018 12:33 PM  Resp 16 04/16/2018 12:33 PM  SpO2 97 % 04/16/2018 12:33 PM  Vitals shown include unvalidated device data.  Last Pain:  Vitals:   04/16/18 0738  TempSrc:   PainSc: Asleep      Patients Stated Pain Goal: 3 (23/30/07 6226)  Complications: No apparent anesthesia complications

## 2018-04-16 NOTE — Brief Op Note (Signed)
04/16/2018  12:17 PM  PATIENT:  Erika Day  84 y.o. female  PRE-OPERATIVE DIAGNOSIS:  small bowel obstruction  POST-OPERATIVE DIAGNOSIS:  small bowel obstruction  PROCEDURE:  Procedure(s): EXPLORATORY LAPAROTOMY WITH LYSIS OF ADHESIONS (N/A)  SURGEON:  Surgeon(s) and Role:    * Armandina Gemma, MD - Primary  ANESTHESIA:   general  EBL:  Minimal   BLOOD ADMINISTERED:none  DRAINS: none   LOCAL MEDICATIONS USED:  NONE  SPECIMEN:  No Specimen  DISPOSITION OF SPECIMEN:  N/A  COUNTS:  YES  TOURNIQUET:  * No tourniquets in log *  DICTATION: .Other Dictation: Dictation Number [id number]  PLAN OF CARE: Admit to inpatient   PATIENT DISPOSITION:  PACU - hemodynamically stable.   Delay start of Pharmacological VTE agent (>24hrs) due to surgical blood loss or risk of bleeding: yes  Armandina Gemma, MD [facility] Office: [phone]

## 2018-04-16 NOTE — Anesthesia Postprocedure Evaluation (Signed)
Anesthesia Post Note  Patient: Erika Day  Procedure(s) Performed: EXPLORATORY LAPAROTOMY WITH LYSIS OF ADHESIONS (N/A Abdomen)     Patient location during evaluation: PACU Anesthesia Type: General Level of consciousness: sedated Pain management: pain level controlled Vital Signs Assessment: post-procedure vital signs reviewed and stable Respiratory status: spontaneous breathing and respiratory function stable Cardiovascular status: stable Postop Assessment: no apparent nausea or vomiting Anesthetic complications: no    Last Vitals:  Vitals:   04/16/18 1330 04/16/18 1345  BP:  120/64  Pulse:    Resp:    Temp:    SpO2: 100%     Last Pain:  Vitals:   04/16/18 0738  TempSrc:   PainSc: Asleep                 Sherilynn Dieu DANIEL

## 2018-04-16 NOTE — Progress Notes (Signed)
General Surgery [facility]  Assessment & Plan: Small bowel obstruction, possible small bowel volvulus - PMH of colectomy for volvulus, PMH ex lap 2015 for SBO - persistent obstructive symptoms - CT with evidence of worsening obstruction, ascites - discussed with patient and family and nursing at bedside - they agree to proceed with operative intervention for exploratory laparotomy with lysis of adhesions and possible small bowel resection  The risks and benefits of the procedure have been discussed at length with the patient.  The patient understands the proposed procedure, potential alternative treatments, and the course of recovery to be expected.  All of the patient's questions have been answered at this time.  The patient wishes to proceed with surgery.        Earnstine Regal, MD, [facility]       Office: [phone]    Chief Complaint: Small bowel obstruction  Subjective: Patient in chair, family in room, nursing in room.  Objective: Vital signs in last 24 hours: Temp:  [97.4 F (36.3 C)-99.7 F (37.6 C)] 97.4 F (36.3 C) (06/15 0416) Pulse Rate:  [75-88] 88 (06/15 0416) Resp:  [14-16] 14 (06/15 0416) BP: (120-138)/(71-84) 123/84 (06/15 0416) SpO2:  [87 %-100 %] 87 % (06/15 0416) Last BM Date: 04/13/18  Intake/Output from previous day: 06/14 0701 - 06/15 0700 In: 240 [P.O.:240] Out: 1000 [Urine:1000] Intake/Output this shift: No intake/output data recorded.  Physical Exam: HEENT - sclerae clear, mucous membranes moist Neck - soft Abdomen - soft, mild distension; mild tenderness right abdomen, no mass  Lab Results:  Recent Labs    04/15/18 0450 04/16/18 0423  WBC 4.3 3.4*  HGB 12.3 12.7  HCT 37.6 38.0  PLT 156 PLATELET CLUMPS NOTED ON SMEAR, COUNT APPEARS DECREASED   BMET Recent Labs    04/15/18 0450 04/16/18 0423  NA 133* 132*  K 4.0 3.9  CL 102 103  CO2 25 22  GLUCOSE 120* 119*  BUN 22*  21*  CREATININE 1.11* 1.06*  CALCIUM 9.2 9.1   PT/INR No results for input(s): LABPROT, INR in the last 72 hours. Comprehensive Metabolic Panel:    Component Value Date/Time   NA 132 (L) 04/16/2018 0423   NA 133 (L) 04/15/2018 0450   NA 135 12/07/2017 1427   NA 140 11/17/2017 1222   NA 141 02/19/2017 1111   NA 142 02/12/2017 0918   K 3.9 04/16/2018 0423   K 4.0 04/15/2018 0450   K 4.3 02/19/2017 1111   K 4.4 02/12/2017 0918   CL 103 04/16/2018 0423   CL 102 04/15/2018 0450   CO2 22 04/16/2018 0423   CO2 25 04/15/2018 0450   CO2 26 02/19/2017 1111   CO2 26 02/12/2017 0918   BUN 21 (H) 04/16/2018 0423   BUN 22 (H) 04/15/2018 0450   BUN 20 12/07/2017 1427   BUN 14 11/17/2017 1222   BUN 16.7 02/19/2017 1111   BUN 10.4 02/12/2017 0918   CREATININE 1.06 (H) 04/16/2018 0423   CREATININE 1.11 (H) 04/15/2018 0450   CREATININE 0.9 02/19/2017 1111   CREATININE 0.9 02/12/2017 0918   GLUCOSE 119 (H) 04/16/2018 0423   GLUCOSE 120 (H) 04/15/2018 0450   GLUCOSE 72 02/19/2017 1111   GLUCOSE 107 02/12/2017 0918   CALCIUM 9.1 04/16/2018 0423   CALCIUM 9.2 04/15/2018 0450   CALCIUM 10.2 02/19/2017 1111   CALCIUM 11.4 (H) 02/12/2017 0918   AST 22 04/13/2018 0435  AST 25 04/12/2018 0426   AST 14 02/19/2017 1111   AST 17 02/12/2017 0918   ALT 15 04/13/2018 0435   ALT 16 04/12/2018 0426   ALT 8 02/19/2017 1111   ALT 8 02/12/2017 0918   ALKPHOS 45 04/13/2018 0435   ALKPHOS 48 04/12/2018 0426   ALKPHOS 92 02/19/2017 1111   ALKPHOS 119 02/12/2017 0918   BILITOT 0.5 04/13/2018 0435   BILITOT 0.9 04/12/2018 0426   BILITOT 0.3 08/23/2017 1542   BILITOT 0.36 02/19/2017 1111   BILITOT 0.74 02/12/2017 0918   PROT 6.4 (L) 04/13/2018 0435   PROT 6.4 (L) 04/12/2018 0426   PROT 7.3 08/23/2017 1542   PROT 7.5 02/19/2017 1111   PROT 7.1 02/19/2017 1111   PROT 9.4 (H) 02/12/2017 0918   ALBUMIN 3.5 04/13/2018 0435   ALBUMIN 3.5 04/12/2018 0426   ALBUMIN 4.1 08/23/2017 1542   ALBUMIN 3.8  02/19/2017 1111   ALBUMIN 4.7 02/12/2017 0918    Studies/Results: Ct Abdomen Pelvis W Contrast  Result Date: 04/15/2018 CLINICAL DATA:  Lymphoma. Diabetes. Hypertension. Sigmoid colectomy secondary to volvulus. Right upper quadrant abdominal pain, vomiting, diarrhea. Abnormal chest radiograph. EXAM: CT ABDOMEN AND PELVIS WITH CONTRAST TECHNIQUE: Multidetector CT imaging of the abdomen and pelvis was performed using the standard protocol following bolus administration of intravenous contrast. CONTRAST:  127mL ISOVUE-300 IOPAMIDOL (ISOVUE-300) INJECTION 61% COMPARISON:  Plain films earlier today.  Most recent CT 04/08/2018 FINDINGS: Lower chest: Right hemidiaphragm elevation with right lung base volume loss and increased atelectasis. Normal heart size with multivessel coronary artery atherosclerosis. Increase in small right pleural effusion. Hepatobiliary: Normal liver. Cholecystectomy, without biliary ductal dilatation. Pancreas: Normal pancreas for age, with atrophy. Spleen: Normal in size, without focal abnormality. Adrenals/Urinary Tract: Normal adrenal glands. Mild bilateral renal cortical atrophy. left renal collecting system punctate calculus. No hydronephrosis. Stomach/Bowel: Stomach is normal in caliber. The colon is normal in caliber. Proximal and mid small bowel loops are positioned within the far right upper quadrant, just under the elevated right hemidiaphragm. They have undergone interval increase in dilatation, including at 4.5 cm on image 22/3 versus on the order of 3.8 cm on the prior exam. There is increased adjacent ascites. No pneumatosis or free intraperitoneal air. There is twisting of the subtending small-bowel mesentery and mesenteric vessels, including on image 44/3. There is at least 1 small bowel transition in this area including on image 40/3. Vascular/Lymphatic: Advanced aortic and branch vessel atherosclerosis. No abdominopelvic adenopathy. Reproductive: Uterine fibroids. The  uterus is positioned eccentric right. No dominant adnexal mass. Other: Slight increase in moderate free fluid within the pelvis. Moderate pelvic floor laxity. Musculoskeletal: Osteopenia. Proximal left femoral fixation. Lumbosacral spine fixation. IMPRESSION: 1. Progressive dilatation of small bowel loops within the high right upper quadrant, under an elevated right hemidiaphragm. Morphology of the bowel and small bowel mesentery in this region suggests internal hernia and possible closed loop obstruction. Increased right upper quadrant fluid in this region. 2. Increased right pleural effusion with adjacent right base volume loss and atelectasis. 3. Coronary artery atherosclerosis. Aortic Atherosclerosis (ICD10-I70.0). 4. Uterine fibroids. 5. Left nephrolithiasis. These results will be called to the ordering clinician or representative by the Radiologist Assistant, and communication documented in the PACS or zVision Dashboard. Electronically Signed   By: Abigail Miyamoto M.D.   On: 04/15/2018 21:39   Dg Chest Port 1 View  Result Date: 04/15/2018 CLINICAL DATA:  Follow-up CHF and small-bowel obstruction EXAM: PORTABLE CHEST 1 VIEW COMPARISON:  Portable exam 1036 hours compared  to 11/23/2016 FINDINGS: Marked elevation of RIGHT diaphragm with significant RIGHT basilar atelectasis. Stable heart size and mediastinal contours. Atherosclerotic calcification aorta. Interval extubation. Minimal atelectasis at LEFT base. Minimal LEFT basilar atelectasis. Upper lungs clear. IMPRESSION: Chronic elevation of RIGHT diaphragm with RIGHT basilar atelectasis. Electronically Signed   By: Lavonia Dana M.D.   On: 04/15/2018 11:15   Dg Abd Portable 1v  Addendum Date: 04/15/2018   ADDENDUM REPORT: 04/15/2018 11:34 ADDENDUM: Study discussed by telephone with Dr. Horris Latino on 04/15/2018 at 1130 hours. Electronically Signed   By: Genevie Ann M.D.   On: 04/15/2018 11:34   Result Date: 04/15/2018 CLINICAL DATA:  84 year old female with small  bowel obstruction. EXAM: PORTABLE ABDOMEN - 1 VIEW COMPARISON:  Radiograph 04/13/2018 and earlier. CT Abdomen and Pelvis 04/08/2018, 03/05/2017. FINDINGS: Portable AP supine view at 0927 hours. There remain right upper quadrant and right abdominal dilated small bowel loops measuring 50-56 millimeters diameter. The enteric tube has been removed. This patient has had prior partial sigmoidectomy for volvulus. On 04/08/2018 virtually all of the small bowel was within the right abdomen, a change since 03/05/2017. large bowel-gas pattern remains within normal limits. Left lung base appears stable. Chronic elevation of the right hemidiaphragm. Stable visualized osseous structures. No definite pneumoperitoneum on this supine view. IMPRESSION: 1. Appearance suspicious for a midgut volvulus with obstructed small bowel in the right abdomen. 2. No pneumoperitoneum identified on this supine view. Electronically Signed: By: Genevie Ann M.D. On: 04/15/2018 11:25      [place] M 04/16/2018  Patient ID: Erika Day, female   DOB: 1930/07/23, 84 y.o.   MRN: [id number]

## 2018-04-16 NOTE — Anesthesia Preprocedure Evaluation (Addendum)
Anesthesia Evaluation    Reviewed: Allergy & Precautions, NPO status , Patient's Chart, lab work & pertinent test results  History of Anesthesia Complications Negative for: history of anesthetic complications  Airway Mallampati: I  TM Distance: >3 FB Neck ROM: Full    Dental  (+) Edentulous Upper, Dental Advisory Given, Edentulous Lower   Pulmonary former smoker,    Pulmonary exam normal        Cardiovascular hypertension, Pt. on medications Normal cardiovascular exam  Study Conclusions  - Left ventricle: The cavity size was normal. There was moderate   concentric hypertrophy. Systolic function was normal. The   estimated ejection fraction was in the range of 60% to 65%. Wall   motion was normal; there were no regional wall motion   abnormalities.    Neuro/Psych PSYCHIATRIC DISORDERS Anxiety CVA    GI/Hepatic Neg liver ROS, GERD  ,  Endo/Other  diabetes  Renal/GU negative Renal ROS     Musculoskeletal   Abdominal   Peds  Hematology  (+) Blood dyscrasia, , CLL Lymphoma   Anesthesia Other Findings   Reproductive/Obstetrics                            Anesthesia Physical Anesthesia Plan  ASA: III and emergent  Anesthesia Plan: General   Post-op Pain Management:    Induction: Intravenous, Rapid sequence and Cricoid pressure planned  PONV Risk Score and Plan: 3 and Treatment may vary due to age or medical condition, Ondansetron and Dexamethasone  Airway Management Planned: Oral ETT  Additional Equipment:   Intra-op Plan:   Post-operative Plan: Possible Post-op intubation/ventilation  Informed Consent: I have reviewed the patients History and Physical, chart, labs and discussed the procedure including the risks, benefits and alternatives for the proposed anesthesia with the patient or authorized representative who has indicated his/her understanding and acceptance.   Consent  reviewed with POA  Plan Discussed with: Anesthesiologist, CRNA and Surgeon  Anesthesia Plan Comments:        Anesthesia Quick Evaluation

## 2018-04-16 NOTE — Op Note (Signed)
NAME: Erika Day, Erika Day MEDICAL RECORD [id number] ACCOUNT [id number] DATE OF BIRTH:1930-07-31 FACILITY: [facility] LOCATION: [facility] PHYSICIAN:Shayli Altemose Leeanne Mannan, MD  OPERATIVE REPORT  DATE OF PROCEDURE:  04/16/2018  PREOPERATIVE DIAGNOSIS:  Small-bowel obstruction.  POSTOPERATIVE DIAGNOSIS:  Small-bowel obstruction.  PROCEDURE:  Exploratory laparotomy with lysis of adhesions.  SURGEON:  Earnstine Regal, MD  ANESTHESIA:  General per Dr. Tedra Senegal  ESTIMATED BLOOD LOSS:  Minimal.  PREPARATION:  ChloraPrep.  COMPLICATIONS:  None.  INDICATIONS:  The patient is an 84 year old female with a history of previous abdominal surgery.  She was admitted to the medical service with signs and symptoms of small-bowel obstruction.  Despite medical management, the patient failed to improve.   Followup CT scan showed progressive dilatation of the proximal small bowel with ascites fluid.  The question of small bowel volvulus or internal hernia or closed loop obstruction was raised.  After discussion with the patient and the family, decision was  made to proceed to the operating room for exploratory laparotomy.  DESCRIPTION OF PROCEDURE:  The procedure was done at the [facility].  The patient was brought to the operating room and placed in supine position on the operating room table.  Following preparation by anesthesia and administration of  general anesthesia, the patient was positioned and then prepped and draped in the usual aseptic fashion.  After ascertaining that an adequate level of anesthesia had been achieved, a midline abdominal incision was made with a #10 blade.  Dissection was  carried through subcutaneous tissues.  Hemostasis was achieved with the electrocautery.  The fascia was incised in the midline and the peritoneal cavity was entered cautiously.  There was a moderate to large amount of clear ascitic fluid present.  This  was evacuated.  The small bowel was markedly  dilated and positioned in the right abdomen and right upper quadrant.  It was gently mobilized and delivered up and through the midline incision.  There appeared to be a 360-degree twist of the small bowel  around its mesentery.  This was causing obstruction.  The bowel was rotated back to a more normal position.  Adhesions were then lysed from the ligament of Treitz to the ileocecal valve, allowing for complete mobilization of the small bowel and  untwisting of the small bowel.  A serosal injury was made at one point where the bowel was adhered to the retroperitoneum.  The serosal injury was repaired with interrupted 3-0 silk sutures.  Ascitic fluid was evacuated from the peritoneal cavity.  Small bowel was returned to the peritoneal cavity in its proper anatomic orientation.  It was covered with the omentum.  All bowel appears viable.  Midline abdominal incision was closed with interrupted #1 Novafil simple sutures.  Subcutaneous tissues were irrigated and hemostasis achieved with electrocautery.  Skin edges were reapproximated with stainless steel staples.  A honeycomb dressing was  placed.  The patient was awakened from anesthesia and transported to the recovery room.  The patient tolerated the procedure well.  Armandina Gemma, [facility] Office: [phone]    LN/NUANCE  D:04/16/2018 T:04/16/2018 JOB:[id number]

## 2018-04-16 NOTE — Progress Notes (Signed)
PROGRESS NOTE    Erika Day  FIE:[id number] DOB: 1930/09/21 DOA: 04/08/2018 PCP: Mack Hook, MD   Brief Narrative:  84 year old female with a history of marginal zone lymphoma, MGUS,diabetes, hypertension,previous history of sigmoid colectomy due to second recurrence of sigmoid volvulus in 2017,who presented to the ER with chief complaint of right upper quadrant abdominal pain associated with an episode of vomiting and diarrhea. Patient describes a feeling of being constipated. CT abdomen pelvis shows multiple dilated small bowel loops in the abdomen and pelvis, suspicious for developing small bowel obstruction. Surgery was consulted and NGT Placed and patient undewent Small Bowel Protocol. Pt admitted for further management.    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)   Sigmoid volvulus s/p lap sigmoid colectomy 06/30/2016   Volvulus of sigmoid colon (HCC)   MGUS (monoclonal gammopathy of unknown significance)  Acute Small Bowel Obstruction likely 2/2 ??midgut volvulus S/P exp lap with lysis of adhesions on 04/16/18  Extensive prior surgical history,  including history of recurrent sigmoid volvulus, status post sigmoid colectomy General Surgery on board: due to new findings of 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, proceeded for exp lap Continue IVF, NPO Pain management, DVT ppx as per general surgery  Essential Hypertension BP soft, post op Hold Home Lisinopril  Osteoarthritis Continue Acetaminophen 650 mg PR or pO  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 / CLL/ MGUS Patient is followed by  Dr.Gorsuch  CKD Stage 3 Stable Avoid nephrotoxic medications, hold Lisinopril Daily BMP  GERD Continue Famotidine IV 20 mg q12h  Leukopenia, ongoing Monitor CBC    Normocytic Anemia of ckd Stable Monitor CBC  Non-Gap Metabolic Acidosis Resolved    DVT prophylaxis: Enoxaparin 40 mg sq q24h Code Status: FULL CODE Family Communication: No family present at bedside  Disposition Plan: Remain Inpatient for continued management  Consultants:   General Surgery    Procedures: Ex-lap on 04/16/18   Antimicrobials:  Anti-infectives (From admission, onward)   Start     Dose/Rate Route Frequency Ordered Stop   04/16/18 1030  ertapenem (INVANZ) 1 g in sodium chloride 0.9 % 100 mL IVPB     1 g 200 mL/hr over 30 Minutes Intravenous On call to O.R. 04/16/18 1016 04/16/18 1126   04/08/18 1715  vancomycin (VANCOCIN) 1,250 mg in sodium chloride 0.9 % 250 mL IVPB  Status:  Discontinued     1,250 mg 166.7 mL/hr over 90 Minutes Intravenous  Once 04/08/18 1711 04/08/18 1732   04/08/18 1715  cefTRIAXone (ROCEPHIN) 1 g in sodium chloride 0.9 % 100 mL IVPB  Status:  Discontinued     1 g 200 mL/hr over 30 Minutes Intravenous  Once 04/08/18 1711 04/08/18 1732   04/08/18 1715  ampicillin (OMNIPEN) 1 g in sodium chloride 0.9 % 100 mL IVPB  Status:  Discontinued     1 g 300 mL/hr over 20 Minutes Intravenous  Once 04/08/18 1711 04/08/18 1728     Subjective:  Patient seen at bedside post op, sleeping, easily arousable after just getting dilaudid as pt was noted to be in pain.   Objective: Vitals:   04/16/18 1330 04/16/18 1345 04/16/18 1400 04/16/18 1417  BP: 133/64 120/64 120/90 113/83  Pulse: 61 68 70 77  Resp: 16 13 14  (!) 22  Temp:  Marland Kitchen)  97.1 F (36.2 C)  (!) 97.5 F (36.4 C)  TempSrc:    Axillary  SpO2: 100% 100% 100% 95%  Weight:    74.1 kg (163 lb 5.8 oz)  Height:    5\' 5"  (1.651 m)    Intake/Output Summary (Last 24 hours) at 04/16/2018 1500 Last data filed at 04/16/2018 1430 Gross per 24 hour  Intake 2750 ml  Output 1050 ml  Net 1700 ml   Filed Weights   04/08/18 1249 04/08/18 2000 04/16/18 1417  Weight: 63 kg (139 lb) 72 kg (158 lb 11.7  oz) 74.1 kg (163 lb 5.8 oz)   Examination: Physical Exam:  Constitutional: Not in any distress.   Eyes: PERRLA.  No jaundice.   ENMT: External ears and nose appear normal Neck: Appears supple with no JVD, Left central line in place Respiratory: Diminished to auscultation bilaterally with no appreciable wheezing, rales, rhonchi. Cardiovascular: Regular rate and rhythm; Has a 2/6 Systolic Murmur.  No lower extremity edema noted  Abdomen: Soft, tender to palpate, non-distended, hypoactive BS, honecomb dressing intact, NGT in place Musculoskeletal: No contractures or cyanosis noted.  No joint deformities Skin: Skin is warm and dry with no appreciable rashes or lesions on limited skin evaluation Neurologic: Patient moves all limbs.   Psychiatric: Normal mood and affect.  Intact judgment intact  Data Reviewed: I have personally reviewed following labs and imaging studies  CBC: Recent Labs  Lab 04/11/18 0435 04/12/18 0426 04/13/18 0435 04/15/18 0450 04/16/18 0423  WBC 3.6* 4.3 3.4* 4.3 3.4*  NEUTROABS 2.6 3.2 2.2 2.9 2.3  HGB 11.8* 11.5* 11.1* 12.3 12.7  HCT 35.9* 35.2* 34.2* 37.6 38.0  MCV 88.2 89.8 88.6 88.7 86.8  PLT 168 172 148* 156 PLATELET CLUMPS NOTED ON SMEAR, COUNT APPEARS DECREASED   Basic Metabolic Panel: Recent Labs  Lab 04/10/18 0358 04/11/18 0435 04/12/18 0426 04/13/18 0435 04/15/18 0450 04/16/18 0423  NA 138 141 143 144 133* 132*  K 4.4 4.1 3.9 3.7 4.0 3.9  CL 105 109 112* 112* 102 103  CO2 20* 21* 18* 24 25 22   GLUCOSE 79 68 62* 100* 120* 119*  BUN 21* 22* 21* 19 22* 21*  CREATININE 1.15* 1.13* 1.11* 1.08* 1.11* 1.06*  CALCIUM 9.5 9.4 9.3 9.3 9.2 9.1  MG 1.9 2.0 2.0 2.0 1.8  --   PHOS 3.9 3.3 2.7 2.0*  --   --    GFR: Estimated Creatinine Clearance: 38.4 mL/min (A) (by C-G formula based on SCr of 1.06 mg/dL (H)). Liver Function Tests: Recent Labs  Lab 04/10/18 0358 04/11/18 0435 04/12/18 0426 04/13/18 0435  AST 27 25 25 22   ALT 15 14 16 15    ALKPHOS 59 53 48 45  BILITOT 1.0 0.7 0.9 0.5  PROT 7.1 6.7 6.4* 6.4*  ALBUMIN 3.9 3.7 3.5 3.5   No results for input(s): LIPASE, AMYLASE in the last 168 hours. No results for input(s): AMMONIA in the last 168 hours. Coagulation Profile: No results for input(s): INR, PROTIME in the last 168 hours. Cardiac Enzymes: No results for input(s): CKTOTAL, CKMB, CKMBINDEX, TROPONINI in the last 168 hours. BNP (last 3 results) No results for input(s): PROBNP in the last 8760 hours. HbA1C: No results for input(s): HGBA1C in the last 72 hours. CBG: Recent Labs  Lab 04/15/18 2006 04/15/18 2348 04/16/18 0407 04/16/18 0744 04/16/18 1233  GLUCAP 82 96 105* 95 110*   Lipid Profile: No results for input(s): CHOL, HDL, LDLCALC, TRIG, CHOLHDL, LDLDIRECT in the last 72 hours.  Thyroid Function Tests: No results for input(s): TSH, T4TOTAL, FREET4, T3FREE, THYROIDAB in the last 72 hours. Anemia Panel: No results for input(s): VITAMINB12, FOLATE, FERRITIN, TIBC, IRON, RETICCTPCT in the last 72 hours. Sepsis Labs: No results for input(s): PROCALCITON, LATICACIDVEN in the last 168 hours.  No results found for this or any previous visit (from the past 240 hour(s)).   Radiology Studies: Ct Abdomen Pelvis W Contrast  Result Date: 04/15/2018 CLINICAL DATA:  Lymphoma. Diabetes. Hypertension. Sigmoid colectomy secondary to volvulus. Right upper quadrant abdominal pain, vomiting, diarrhea. Abnormal chest radiograph. EXAM: CT ABDOMEN AND PELVIS WITH CONTRAST TECHNIQUE: Multidetector CT imaging of the abdomen and pelvis was performed using the standard protocol following bolus administration of intravenous contrast. CONTRAST:  116mL ISOVUE-300 IOPAMIDOL (ISOVUE-300) INJECTION 61% COMPARISON:  Plain films earlier today.  Most recent CT 04/08/2018 FINDINGS: Lower chest: Right hemidiaphragm elevation with right lung base volume loss and increased atelectasis. Normal heart size with multivessel coronary artery  atherosclerosis. Increase in small right pleural effusion. Hepatobiliary: Normal liver. Cholecystectomy, without biliary ductal dilatation. Pancreas: Normal pancreas for age, with atrophy. Spleen: Normal in size, without focal abnormality. Adrenals/Urinary Tract: Normal adrenal glands. Mild bilateral renal cortical atrophy. left renal collecting system punctate calculus. No hydronephrosis. Stomach/Bowel: Stomach is normal in caliber. The colon is normal in caliber. Proximal and mid small bowel loops are positioned within the far right upper quadrant, just under the elevated right hemidiaphragm. They have undergone interval increase in dilatation, including at 4.5 cm on image 22/3 versus on the order of 3.8 cm on the prior exam. There is increased adjacent ascites. No pneumatosis or free intraperitoneal air. There is twisting of the subtending small-bowel mesentery and mesenteric vessels, including on image 44/3. There is at least 1 small bowel transition in this area including on image 40/3. Vascular/Lymphatic: Advanced aortic and branch vessel atherosclerosis. No abdominopelvic adenopathy. Reproductive: Uterine fibroids. The uterus is positioned eccentric right. No dominant adnexal mass. Other: Slight increase in moderate free fluid within the pelvis. Moderate pelvic floor laxity. Musculoskeletal: Osteopenia. Proximal left femoral fixation. Lumbosacral spine fixation. IMPRESSION: 1. Progressive dilatation of small bowel loops within the high right upper quadrant, under an elevated right hemidiaphragm. Morphology of the bowel and small bowel mesentery in this region suggests internal hernia and possible closed loop obstruction. Increased right upper quadrant fluid in this region. 2. Increased right pleural effusion with adjacent right base volume loss and atelectasis. 3. Coronary artery atherosclerosis. Aortic Atherosclerosis (ICD10-I70.0). 4. Uterine fibroids. 5. Left nephrolithiasis. These results will be called  to the ordering clinician or representative by the Radiologist Assistant, and communication documented in the PACS or zVision Dashboard. Electronically Signed   By: Abigail Miyamoto M.D.   On: 04/15/2018 21:39   Dg Chest Port 1 View  Result Date: 04/16/2018 CLINICAL DATA:  84 y/o F; central venous line. Status post exploratory laparotomy. EXAM: PORTABLE CHEST 1 VIEW COMPARISON:  04/15/2018 chest radiograph. FINDINGS: Pneumoperitoneum, likely postoperative. Enteric tube tip extends below the field of view into the abdomen. Left central venous catheter tip projects over the left brachiocephalic SVC junction. Calcific atherosclerosis of the aorta. Elevated right hemidiaphragm with right lung base atelectasis. Multilevel degenerative changes of the spine. No pleural effusion or pneumothorax. IMPRESSION: 1. Left central venous catheter tip projects over the left brachiocephalic/SVC junction. 2. Pneumoperitoneum, likely postoperative. 3. Stable elevated right hemidiaphragm with right basilar atelectasis. Electronically Signed   By: Kristine Garbe M.D.   On: 04/16/2018 13:24   Dg Chest Port 1  View  Result Date: 04/15/2018 CLINICAL DATA:  Follow-up CHF and small-bowel obstruction EXAM: PORTABLE CHEST 1 VIEW COMPARISON:  Portable exam 1036 hours compared to 11/23/2016 FINDINGS: Marked elevation of RIGHT diaphragm with significant RIGHT basilar atelectasis. Stable heart size and mediastinal contours. Atherosclerotic calcification aorta. Interval extubation. Minimal atelectasis at LEFT base. Minimal LEFT basilar atelectasis. Upper lungs clear. IMPRESSION: Chronic elevation of RIGHT diaphragm with RIGHT basilar atelectasis. Electronically Signed   By: Lavonia Dana M.D.   On: 04/15/2018 11:15   Dg Abd Portable 1v  Addendum Date: 04/15/2018   ADDENDUM REPORT: 04/15/2018 11:34 ADDENDUM: Study discussed by telephone with Dr. Horris Latino on 04/15/2018 at 1130 hours. Electronically Signed   By: Genevie Ann M.D.   On:  04/15/2018 11:34   Result Date: 04/15/2018 CLINICAL DATA:  84 year old female with small bowel obstruction. EXAM: PORTABLE ABDOMEN - 1 VIEW COMPARISON:  Radiograph 04/13/2018 and earlier. CT Abdomen and Pelvis 04/08/2018, 03/05/2017. FINDINGS: Portable AP supine view at 0927 hours. There remain right upper quadrant and right abdominal dilated small bowel loops measuring 50-56 millimeters diameter. The enteric tube has been removed. This patient has had prior partial sigmoidectomy for volvulus. On 04/08/2018 virtually all of the small bowel was within the right abdomen, a change since 03/05/2017. large bowel-gas pattern remains within normal limits. Left lung base appears stable. Chronic elevation of the right hemidiaphragm. Stable visualized osseous structures. No definite pneumoperitoneum on this supine view. IMPRESSION: 1. Appearance suspicious for a midgut volvulus with obstructed small bowel in the right abdomen. 2. No pneumoperitoneum identified on this supine view. Electronically Signed: By: Genevie Ann M.D. On: 04/15/2018 11:25   Scheduled Meds: . [START ON 04/17/2018] enoxaparin (LOVENOX) injection  40 mg Subcutaneous Q24H  . fentaNYL       Continuous Infusions: . albumin human    . dextrose 5 % and 0.45 % NaCl with KCl 20 mEq/L 100 mL/hr at 04/16/18 1444  . famotidine (PEPCID) IV 20 mg (04/16/18 1447)    LOS: 8 days   Adline Peals [place],MD [facility]  If 7PM-7AM, please contact night-coverage [email or web address] Password [email or web address] 04/16/2018, 3:00 PM

## 2018-04-16 NOTE — Anesthesia Procedure Notes (Signed)
Central Venous Catheter Insertion Performed by: Duane Boston, MD, anesthesiologist Start/End6/15/2019 10:52 AM, 04/16/2018 11:02 AM Patient location: Pre-op. Preanesthetic checklist: patient identified, IV checked, site marked, risks and benefits discussed, surgical consent, monitors and equipment checked, pre-op evaluation, timeout performed and anesthesia consent Position: Trendelenburg Lidocaine 1% used for infiltration and patient sedated Hand hygiene performed , maximum sterile barriers used  and Seldinger technique used Catheter size: 8 Fr Total catheter length 16. Central line was placed.Double lumen Procedure performed using ultrasound guided technique. Ultrasound Notes:anatomy identified, needle tip was noted to be adjacent to the nerve/plexus identified, no ultrasound evidence of intravascular and/or intraneural injection and image(s) printed for medical record Attempts: 2 Following insertion, dressing applied, line sutured and Biopatch. Post procedure assessment: blood return through all ports, free fluid flow and no air  Patient tolerated the procedure well with no immediate complications.

## 2018-04-16 NOTE — Progress Notes (Signed)
Pt taken down to OR. Pt became sick on elevator ride and projectile vomited multiple times. Pt was cleaned after arriving to pre-op bay.

## 2018-04-16 NOTE — Anesthesia Procedure Notes (Signed)
Procedure Name: Intubation Date/Time: 04/16/2018 11:00 AM Performed by: Lissa Morales, CRNA Pre-anesthesia Checklist: Patient identified, Emergency Drugs available, Suction available and Patient being monitored Patient Re-evaluated:Patient Re-evaluated prior to induction Oxygen Delivery Method: Circle system utilized Preoxygenation: Pre-oxygenation with 100% oxygen Induction Type: IV induction Ventilation: Mask ventilation without difficulty Laryngoscope Size: Mac and 3 Tube type: Subglottic suction tube (taper ETT) Tube size: 7.5 mm Number of attempts: 1 Airway Equipment and Method: Stylet and Oral airway Placement Confirmation: ETT inserted through vocal cords under direct vision,  positive ETCO2 and breath sounds checked- equal and bilateral Secured at: 21 cm Tube secured with: Tape Dental Injury: Teeth and Oropharynx as per pre-operative assessment

## 2018-04-17 ENCOUNTER — Inpatient Hospital Stay: Payer: Self-pay

## 2018-04-17 ENCOUNTER — Encounter (HOSPITAL_COMMUNITY): Payer: Self-pay | Admitting: Surgery

## 2018-04-17 LAB — CBC WITH DIFFERENTIAL/PLATELET
Basophils Absolute: 0 10*3/uL (ref 0.0–0.1)
Basophils Relative: 0 %
EOS ABS: 0.1 10*3/uL (ref 0.0–0.7)
Eosinophils Relative: 1 %
HEMATOCRIT: 29.4 % — AB (ref 36.0–46.0)
Hemoglobin: 9.4 g/dL — ABNORMAL LOW (ref 12.0–15.0)
LYMPHS ABS: 0.7 10*3/uL (ref 0.7–4.0)
Lymphocytes Relative: 9 %
MCH: 28.3 pg (ref 26.0–34.0)
MCHC: 32 g/dL (ref 30.0–36.0)
MCV: 88.6 fL (ref 78.0–100.0)
MONOS PCT: 11 %
Monocytes Absolute: 0.9 10*3/uL (ref 0.1–1.0)
NEUTROS PCT: 79 %
Neutro Abs: 6.5 10*3/uL (ref 1.7–7.7)
Platelets: 159 10*3/uL (ref 150–400)
RBC: 3.32 MIL/uL — ABNORMAL LOW (ref 3.87–5.11)
RDW: 13.3 % (ref 11.5–15.5)
WBC: 8.1 10*3/uL (ref 4.0–10.5)

## 2018-04-17 LAB — BASIC METABOLIC PANEL
Anion gap: 3 — ABNORMAL LOW (ref 5–15)
BUN: 23 mg/dL — ABNORMAL HIGH (ref 6–20)
CHLORIDE: 111 mmol/L (ref 101–111)
CO2: 21 mmol/L — AB (ref 22–32)
CREATININE: 1.26 mg/dL — AB (ref 0.44–1.00)
Calcium: 7.8 mg/dL — ABNORMAL LOW (ref 8.9–10.3)
GFR calc non Af Amer: 37 mL/min — ABNORMAL LOW (ref >60)
GFR, EST AFRICAN AMERICAN: 43 mL/min — AB (ref >60)
GLUCOSE: 119 mg/dL — AB (ref 65–99)
Potassium: 3.8 mmol/L (ref 3.5–5.1)
Sodium: 135 mmol/L (ref 135–145)

## 2018-04-17 LAB — TYPE AND SCREEN
ABO/RH(D): A POS
ANTIBODY SCREEN: NEGATIVE

## 2018-04-17 LAB — GLUCOSE, CAPILLARY
GLUCOSE-CAPILLARY: 86 mg/dL (ref 65–99)
Glucose-Capillary: 96 mg/dL (ref 65–99)
Glucose-Capillary: 79 mg/dL (ref 65–99)

## 2018-04-17 MED ORDER — SODIUM CHLORIDE 0.9% FLUSH: 10.0000 mL | Freq: Two times a day (BID) | INTRAVENOUS | Status: DC

## 2018-04-17 MED ORDER — CHLORHEXIDINE GLUCONATE CLOTH 2 % EX PADS
6.0000 | MEDICATED_PAD | Freq: Every day | CUTANEOUS | Status: DC
  Administered 2018-04-17 – 2018-04-20 (×4): 6 via TOPICAL

## 2018-04-17 MED ORDER — SODIUM CHLORIDE 0.9 % IV BOLUS
1000.0000 mL | Freq: Once | INTRAVENOUS | Status: AC
  Administered 2018-04-17: 1000 mL via INTRAVENOUS

## 2018-04-17 MED ORDER — SODIUM CHLORIDE 0.9% FLUSH: 10.0000 mL | INTRAVENOUS | Status: DC | PRN

## 2018-04-17 NOTE — Progress Notes (Signed)
PROGRESS NOTE    Erika Day  XNA:[id number] DOB: 05-06-1930 DOA: 04/08/2018 PCP: Mack Hook, MD   Brief Narrative:  84 year old female with a history of marginal zone lymphoma, MGUS,diabetes, hypertension,previous history of sigmoid colectomy due to second recurrence of sigmoid volvulus in 2017,who presented to the ER with chief complaint of right upper quadrant abdominal pain associated with an episode of vomiting and diarrhea. Patient describes a feeling of being constipated. CT abdomen pelvis shows multiple dilated small bowel loops in the abdomen and pelvis, suspicious for developing small bowel obstruction. Surgery was consulted and NGT Placed and patient undewent Small Bowel Protocol. Pt admitted for further management.    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)   Sigmoid volvulus s/p lap sigmoid colectomy 06/30/2016   Volvulus of sigmoid colon (HCC)   MGUS (monoclonal gammopathy of unknown significance)  Acute Small Bowel Obstruction likely 2/2 ??midgut volvulus S/P exp lap with lysis of adhesions on 04/16/18  Extensive prior surgical history,  including history of recurrent sigmoid volvulus, status post sigmoid colectomy General Surgery on board: due to new findings of 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, proceeded for exp lap Continue IVF, NPO Pain management, DVT ppx as per general surgery  Hypotension Likely due to narcotics post op Warm peripheries, no signs of sepsis Continue IVF, limit narcotics as much as possible  Essential Hypertension BP soft Hold Home Lisinopril  Osteoarthritis Continue Acetaminophen 650 mg PR or pO  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 / CLL/ MGUS Patient is followed by  Dr.Gorsuch  CKD Stage 3 Stable Avoid nephrotoxic  medications, hold Lisinopril Daily BMP  GERD Continue Famotidine IV 20 mg q12h  Leukopenia Resolved Monitor CBC   Normocytic Anemia of ckd/Acute blood loss Drop from 12-->9.4, likely due to post op, dilutional Type and screen done, transfuse if <7 Monitor CBC  Non-Gap Metabolic Acidosis Resolved    DVT prophylaxis: Enoxaparin 40 mg sq q24h Code Status: FULL CODE Family Communication: No family present at bedside  Disposition Plan: Remain Inpatient for continued management  Consultants:   General Surgery    Procedures: Ex-lap on 04/16/18   Antimicrobials:  Anti-infectives (From admission, onward)   Start     Dose/Rate Route Frequency Ordered Stop   04/16/18 1030  ertapenem (INVANZ) 1 g in sodium chloride 0.9 % 100 mL IVPB     1 g 200 mL/hr over 30 Minutes Intravenous On call to O.R. 04/16/18 1016 04/16/18 2113   04/08/18 1715  vancomycin (VANCOCIN) 1,250 mg in sodium chloride 0.9 % 250 mL IVPB  Status:  Discontinued     1,250 mg 166.7 mL/hr over 90 Minutes Intravenous  Once 04/08/18 1711 04/08/18 1732   04/08/18 1715  cefTRIAXone (ROCEPHIN) 1 g in sodium chloride 0.9 % 100 mL IVPB  Status:  Discontinued     1 g 200 mL/hr over 30 Minutes Intravenous  Once 04/08/18 1711 04/08/18 1732   04/08/18 1715  ampicillin (OMNIPEN) 1 g in sodium chloride 0.9 % 100 mL IVPB  Status:  Discontinued     1 g 300 mL/hr over 20 Minutes Intravenous  Once 04/08/18 1711 04/08/18 1728     Subjective:  Patient seen at bedside post op, sleeping, easily arousable, c/o abdominal pain.   Objective: Vitals:   04/17/18 1100 04/17/18  1200 04/17/18 1300 04/17/18 1400  BP: (!) 114/52 (!) 113/50  (!) 93/59  Pulse:  91 75   Resp: (!) 25 (!) 24 14 (!) 21  Temp:  97.9 F (36.6 C)    TempSrc:  Axillary    SpO2:  98% 97%   Weight:      Height:        Intake/Output Summary (Last 24 hours) at 04/17/2018 1540 Last data filed at 04/17/2018 0824 Gross per 24 hour  Intake 300 ml  Output 1125 ml    Net -825 ml   Filed Weights   04/08/18 1249 04/08/18 2000 04/16/18 1417  Weight: 63 kg (139 lb) 72 kg (158 lb 11.7 oz) 74.1 kg (163 lb 5.8 oz)   Examination: Physical Exam:  Constitutional: Mild distress due to pain   Eyes: PERRLA.  No jaundice.   ENMT: External ears and nose appear normal Neck: Appears supple with no JVD, Left central line in place Respiratory: Diminished to auscultation bilaterally with no appreciable wheezing, rales, rhonchi. Cardiovascular: Regular rate and rhythm; Has a 2/6 Systolic Murmur.  No lower extremity edema noted  Abdomen: Soft, tender to palpate, non-distended, hypoactive BS, honecomb dressing intact, NGT in place Musculoskeletal: No contractures or cyanosis noted.  No joint deformities Skin: Skin is warm and dry with no appreciable rashes or lesions on limited skin evaluation Neurologic: Patient moves all limbs.   Psychiatric: Normal mood and affect.  Intact judgment intact  Data Reviewed: I have personally reviewed following labs and imaging studies  CBC: Recent Labs  Lab 04/12/18 0426 04/13/18 0435 04/15/18 0450 04/16/18 0423 04/17/18 0617  WBC 4.3 3.4* 4.3 3.4* 8.1  NEUTROABS 3.2 2.2 2.9 2.3 6.5  HGB 11.5* 11.1* 12.3 12.7 9.4*  HCT 35.2* 34.2* 37.6 38.0 29.4*  MCV 89.8 88.6 88.7 86.8 88.6  PLT 172 148* 156 PLATELET CLUMPS NOTED ON SMEAR, COUNT APPEARS DECREASED 202   Basic Metabolic Panel: Recent Labs  Lab 04/11/18 0435 04/12/18 0426 04/13/18 0435 04/15/18 0450 04/16/18 0423 04/17/18 0443  NA 141 143 144 133* 132* 135  K 4.1 3.9 3.7 4.0 3.9 3.8  CL 109 112* 112* 102 103 111  CO2 21* 18* 24 25 22  21*  GLUCOSE 68 62* 100* 120* 119* 119*  BUN 22* 21* 19 22* 21* 23*  CREATININE 1.13* 1.11* 1.08* 1.11* 1.06* 1.26*  CALCIUM 9.4 9.3 9.3 9.2 9.1 7.8*  MG 2.0 2.0 2.0 1.8  --   --   PHOS 3.3 2.7 2.0*  --   --   --    GFR: Estimated Creatinine Clearance: 32.3 mL/min (A) (by C-G formula based on SCr of 1.26 mg/dL (H)). Liver  Function Tests: Recent Labs  Lab 04/11/18 0435 04/12/18 0426 04/13/18 0435  AST 25 25 22   ALT 14 16 15   ALKPHOS 53 48 45  BILITOT 0.7 0.9 0.5  PROT 6.7 6.4* 6.4*  ALBUMIN 3.7 3.5 3.5   No results for input(s): LIPASE, AMYLASE in the last 168 hours. No results for input(s): AMMONIA in the last 168 hours. Coagulation Profile: No results for input(s): INR, PROTIME in the last 168 hours. Cardiac Enzymes: No results for input(s): CKTOTAL, CKMB, CKMBINDEX, TROPONINI in the last 168 hours. BNP (last 3 results) No results for input(s): PROBNP in the last 8760 hours. HbA1C: No results for input(s): HGBA1C in the last 72 hours. CBG: Recent Labs  Lab 04/16/18 0407 04/16/18 0744 04/16/18 1233 04/17/18 0731 04/17/18 1120  GLUCAP 105* 95 110* 86 96  Lipid Profile: No results for input(s): CHOL, HDL, LDLCALC, TRIG, CHOLHDL, LDLDIRECT in the last 72 hours. Thyroid Function Tests: No results for input(s): TSH, T4TOTAL, FREET4, T3FREE, THYROIDAB in the last 72 hours. Anemia Panel: No results for input(s): VITAMINB12, FOLATE, FERRITIN, TIBC, IRON, RETICCTPCT in the last 72 hours. Sepsis Labs: No results for input(s): PROCALCITON, LATICACIDVEN in the last 168 hours.  Recent Results (from the past 240 hour(s))  MRSA PCR Screening     Status: None   Collection Time: 04/16/18  2:55 PM  Result Value Ref Range Status   MRSA by PCR NEGATIVE NEGATIVE Final    Comment:        The GeneXpert MRSA Assay (FDA approved for NASAL specimens only), is one component of a comprehensive MRSA colonization surveillance program. It is not intended to diagnose MRSA infection nor to guide or monitor treatment for MRSA infections. Performed at [facility], [place] [address], [place], [place] [address]      Radiology Studies: Ct Abdomen Pelvis W Contrast  Result Date: 04/15/2018 CLINICAL DATA:  Lymphoma. Diabetes. Hypertension. Sigmoid colectomy secondary to volvulus. Right upper  quadrant abdominal pain, vomiting, diarrhea. Abnormal chest radiograph. EXAM: CT ABDOMEN AND PELVIS WITH CONTRAST TECHNIQUE: Multidetector CT imaging of the abdomen and pelvis was performed using the standard protocol following bolus administration of intravenous contrast. CONTRAST:  171mL ISOVUE-300 IOPAMIDOL (ISOVUE-300) INJECTION 61% COMPARISON:  Plain films earlier today.  Most recent CT 04/08/2018 FINDINGS: Lower chest: Right hemidiaphragm elevation with right lung base volume loss and increased atelectasis. Normal heart size with multivessel coronary artery atherosclerosis. Increase in small right pleural effusion. Hepatobiliary: Normal liver. Cholecystectomy, without biliary ductal dilatation. Pancreas: Normal pancreas for age, with atrophy. Spleen: Normal in size, without focal abnormality. Adrenals/Urinary Tract: Normal adrenal glands. Mild bilateral renal cortical atrophy. left renal collecting system punctate calculus. No hydronephrosis. Stomach/Bowel: Stomach is normal in caliber. The colon is normal in caliber. Proximal and mid small bowel loops are positioned within the far right upper quadrant, just under the elevated right hemidiaphragm. They have undergone interval increase in dilatation, including at 4.5 cm on image 22/3 versus on the order of 3.8 cm on the prior exam. There is increased adjacent ascites. No pneumatosis or free intraperitoneal air. There is twisting of the subtending small-bowel mesentery and mesenteric vessels, including on image 44/3. There is at least 1 small bowel transition in this area including on image 40/3. Vascular/Lymphatic: Advanced aortic and branch vessel atherosclerosis. No abdominopelvic adenopathy. Reproductive: Uterine fibroids. The uterus is positioned eccentric right. No dominant adnexal mass. Other: Slight increase in moderate free fluid within the pelvis. Moderate pelvic floor laxity. Musculoskeletal: Osteopenia. Proximal left femoral fixation. Lumbosacral  spine fixation. IMPRESSION: 1. Progressive dilatation of small bowel loops within the high right upper quadrant, under an elevated right hemidiaphragm. Morphology of the bowel and small bowel mesentery in this region suggests internal hernia and possible closed loop obstruction. Increased right upper quadrant fluid in this region. 2. Increased right pleural effusion with adjacent right base volume loss and atelectasis. 3. Coronary artery atherosclerosis. Aortic Atherosclerosis (ICD10-I70.0). 4. Uterine fibroids. 5. Left nephrolithiasis. These results will be called to the ordering clinician or representative by the Radiologist Assistant, and communication documented in the PACS or zVision Dashboard. Electronically Signed   By: Abigail Miyamoto M.D.   On: 04/15/2018 21:39   Dg Chest Port 1 View  Result Date: 04/16/2018 CLINICAL DATA:  84 y/o F; central venous line. Status post exploratory laparotomy. EXAM: PORTABLE  CHEST 1 VIEW COMPARISON:  04/15/2018 chest radiograph. FINDINGS: Pneumoperitoneum, likely postoperative. Enteric tube tip extends below the field of view into the abdomen. Left central venous catheter tip projects over the left brachiocephalic SVC junction. Calcific atherosclerosis of the aorta. Elevated right hemidiaphragm with right lung base atelectasis. Multilevel degenerative changes of the spine. No pleural effusion or pneumothorax. IMPRESSION: 1. Left central venous catheter tip projects over the left brachiocephalic/SVC junction. 2. Pneumoperitoneum, likely postoperative. 3. Stable elevated right hemidiaphragm with right basilar atelectasis. Electronically Signed   By: Kristine Garbe M.D.   On: 04/16/2018 13:24   [place] Ekg Site Rite  Result Date: 04/17/2018 If Site Rite image not attached, placement could not be confirmed due to current cardiac rhythm.  Scheduled Meds: . chlorhexidine  15 mL Mouth Rinse BID  . Chlorhexidine Gluconate Cloth  6 each Topical Daily  . enoxaparin  (LOVENOX) injection  40 mg Subcutaneous Q24H  . mouth rinse  15 mL Mouth Rinse q12n4p  . sodium chloride flush  10-40 mL Intracatheter Q12H   Continuous Infusions: . dextrose 5 % and 0.45 % NaCl with KCl 20 mEq/L 75 mL/hr at 04/17/18 0852  . famotidine (PEPCID) IV Stopped (04/17/18 0900)    LOS: 9 days   Adline Peals Kanen Mottola,MD [facility]  If 7PM-7AM, please contact night-coverage [email or web address] Password [facility] 04/17/2018, 3:40 PM

## 2018-04-17 NOTE — Progress Notes (Signed)
General [facility]  Assessment & Plan: POD#1 status post ex lap with lysis of adhesions for SBO  NPO, NG, IV hydration  Monitor in stepdown unit  OOB to chair today if possible  Await resolution of ileus Per medical service        Earnstine Regal, MD, [facility]       Office: [phone]    Chief Complaint: Small bowel obstruction  Subjective: Patient in bed, responsive to voice  Objective: Vital signs in last 24 hours: Temp:  [97.1 F (36.2 C)-98.4 F (36.9 C)] 97.7 F (36.5 C) (06/16 0400) Pulse Rate:  [25-101] 80 (06/16 0530) Resp:  [10-22] 17 (06/16 0530) BP: (62-136)/(29-92) 62/29 (06/16 0530) SpO2:  [94 %-100 %] 99 % (06/16 0530) Weight:  [74.1 kg (163 lb 5.8 oz)] 74.1 kg (163 lb 5.8 oz) (06/15 1417) Last BM Date: 04/13/18  Intake/Output from previous day: 06/15 0701 - 06/16 0700 In: 3126.7 [I.V.:2226.7; IV Piggyback:800] Out: 2694 [Urine:550; Emesis/NG output:1000; Blood:100] Intake/Output this shift: No intake/output data recorded.  Physical Exam: HEENT - sclerae clear, mucous membranes moist Neck - soft Chest - clear bilaterally Cor - RRR Abdomen - soft, wound dry and intact; quiet  Lab Results:  Recent Labs    04/15/18 0450 04/16/18 0423  WBC 4.3 3.4*  HGB 12.3 12.7  HCT 37.6 38.0  PLT 156 PLATELET CLUMPS NOTED ON SMEAR, COUNT APPEARS DECREASED   BMET Recent Labs    04/16/18 0423 04/17/18 0443  NA 132* 135  K 3.9 3.8  CL 103 111  CO2 22 21*  GLUCOSE 119* 119*  BUN 21* 23*  CREATININE 1.06* 1.26*  CALCIUM 9.1 7.8*   PT/INR No results for input(s): LABPROT, INR in the last 72 hours. Comprehensive Metabolic Panel:    Component Value Date/Time   NA 135 04/17/2018 0443   NA 132 (L) 04/16/2018 0423   NA 135 12/07/2017 1427   NA 140 11/17/2017 1222   NA 141 02/19/2017 1111   NA 142 02/12/2017 0918   K 3.8 04/17/2018 0443   K 3.9 04/16/2018 0423   K 4.3 02/19/2017  1111   K 4.4 02/12/2017 0918   CL 111 04/17/2018 0443   CL 103 04/16/2018 0423   CO2 21 (L) 04/17/2018 0443   CO2 22 04/16/2018 0423   CO2 26 02/19/2017 1111   CO2 26 02/12/2017 0918   BUN 23 (H) 04/17/2018 0443   BUN 21 (H) 04/16/2018 0423   BUN 20 12/07/2017 1427   BUN 14 11/17/2017 1222   BUN 16.7 02/19/2017 1111   BUN 10.4 02/12/2017 0918   CREATININE 1.26 (H) 04/17/2018 0443   CREATININE 1.06 (H) 04/16/2018 0423   CREATININE 0.9 02/19/2017 1111   CREATININE 0.9 02/12/2017 0918   GLUCOSE 119 (H) 04/17/2018 0443   GLUCOSE 119 (H) 04/16/2018 0423   GLUCOSE 72 02/19/2017 1111   GLUCOSE 107 02/12/2017 0918   CALCIUM 7.8 (L) 04/17/2018 0443   CALCIUM 9.1 04/16/2018 0423   CALCIUM 10.2 02/19/2017 1111   CALCIUM 11.4 (H) 02/12/2017 0918   AST 22 04/13/2018 0435   AST 25 04/12/2018 0426   AST 14 02/19/2017 1111   AST 17 02/12/2017 0918   ALT 15 04/13/2018 0435   ALT 16 04/12/2018 0426   ALT 8 02/19/2017 1111   ALT 8 02/12/2017 0918   ALKPHOS 45 04/13/2018 0435   ALKPHOS 48 04/12/2018 0426  ALKPHOS 92 02/19/2017 1111   ALKPHOS 119 02/12/2017 0918   BILITOT 0.5 04/13/2018 0435   BILITOT 0.9 04/12/2018 0426   BILITOT 0.3 08/23/2017 1542   BILITOT 0.36 02/19/2017 1111   BILITOT 0.74 02/12/2017 0918   PROT 6.4 (L) 04/13/2018 0435   PROT 6.4 (L) 04/12/2018 0426   PROT 7.3 08/23/2017 1542   PROT 7.5 02/19/2017 1111   PROT 7.1 02/19/2017 1111   PROT 9.4 (H) 02/12/2017 0918   ALBUMIN 3.5 04/13/2018 0435   ALBUMIN 3.5 04/12/2018 0426   ALBUMIN 4.1 08/23/2017 1542   ALBUMIN 3.8 02/19/2017 1111   ALBUMIN 4.7 02/12/2017 0918    Studies/Results: Ct Abdomen Pelvis W Contrast  Result Date: 04/15/2018 CLINICAL DATA:  Lymphoma. Diabetes. Hypertension. Sigmoid colectomy secondary to volvulus. Right upper quadrant abdominal pain, vomiting, diarrhea. Abnormal chest radiograph. EXAM: CT ABDOMEN AND PELVIS WITH CONTRAST TECHNIQUE: Multidetector CT imaging of the abdomen and pelvis  was performed using the standard protocol following bolus administration of intravenous contrast. CONTRAST:  1102mL ISOVUE-300 IOPAMIDOL (ISOVUE-300) INJECTION 61% COMPARISON:  Plain films earlier today.  Most recent CT 04/08/2018 FINDINGS: Lower chest: Right hemidiaphragm elevation with right lung base volume loss and increased atelectasis. Normal heart size with multivessel coronary artery atherosclerosis. Increase in small right pleural effusion. Hepatobiliary: Normal liver. Cholecystectomy, without biliary ductal dilatation. Pancreas: Normal pancreas for age, with atrophy. Spleen: Normal in size, without focal abnormality. Adrenals/Urinary Tract: Normal adrenal glands. Mild bilateral renal cortical atrophy. left renal collecting system punctate calculus. No hydronephrosis. Stomach/Bowel: Stomach is normal in caliber. The colon is normal in caliber. Proximal and mid small bowel loops are positioned within the far right upper quadrant, just under the elevated right hemidiaphragm. They have undergone interval increase in dilatation, including at 4.5 cm on image 22/3 versus on the order of 3.8 cm on the prior exam. There is increased adjacent ascites. No pneumatosis or free intraperitoneal air. There is twisting of the subtending small-bowel mesentery and mesenteric vessels, including on image 44/3. There is at least 1 small bowel transition in this area including on image 40/3. Vascular/Lymphatic: Advanced aortic and branch vessel atherosclerosis. No abdominopelvic adenopathy. Reproductive: Uterine fibroids. The uterus is positioned eccentric right. No dominant adnexal mass. Other: Slight increase in moderate free fluid within the pelvis. Moderate pelvic floor laxity. Musculoskeletal: Osteopenia. Proximal left femoral fixation. Lumbosacral spine fixation. IMPRESSION: 1. Progressive dilatation of small bowel loops within the high right upper quadrant, under an elevated right hemidiaphragm. Morphology of the bowel and  small bowel mesentery in this region suggests internal hernia and possible closed loop obstruction. Increased right upper quadrant fluid in this region. 2. Increased right pleural effusion with adjacent right base volume loss and atelectasis. 3. Coronary artery atherosclerosis. Aortic Atherosclerosis (ICD10-I70.0). 4. Uterine fibroids. 5. Left nephrolithiasis. These results will be called to the ordering clinician or representative by the Radiologist Assistant, and communication documented in the PACS or zVision Dashboard. Electronically Signed   By: Abigail Miyamoto M.D.   On: 04/15/2018 21:39   Dg Chest Port 1 View  Result Date: 04/16/2018 CLINICAL DATA:  84 y/o F; central venous line. Status post exploratory laparotomy. EXAM: PORTABLE CHEST 1 VIEW COMPARISON:  04/15/2018 chest radiograph. FINDINGS: Pneumoperitoneum, likely postoperative. Enteric tube tip extends below the field of view into the abdomen. Left central venous catheter tip projects over the left brachiocephalic SVC junction. Calcific atherosclerosis of the aorta. Elevated right hemidiaphragm with right lung base atelectasis. Multilevel degenerative changes of the spine. No pleural effusion or  pneumothorax. IMPRESSION: 1. Left central venous catheter tip projects over the left brachiocephalic/SVC junction. 2. Pneumoperitoneum, likely postoperative. 3. Stable elevated right hemidiaphragm with right basilar atelectasis. Electronically Signed   By: Kristine Garbe M.D.   On: 04/16/2018 13:24   Dg Chest Port 1 View  Result Date: 04/15/2018 CLINICAL DATA:  Follow-up CHF and small-bowel obstruction EXAM: PORTABLE CHEST 1 VIEW COMPARISON:  Portable exam 1036 hours compared to 11/23/2016 FINDINGS: Marked elevation of RIGHT diaphragm with significant RIGHT basilar atelectasis. Stable heart size and mediastinal contours. Atherosclerotic calcification aorta. Interval extubation. Minimal atelectasis at LEFT base. Minimal LEFT basilar atelectasis.  Upper lungs clear. IMPRESSION: Chronic elevation of RIGHT diaphragm with RIGHT basilar atelectasis. Electronically Signed   By: Lavonia Dana M.D.   On: 04/15/2018 11:15   Dg Abd Portable 1v  Addendum Date: 04/15/2018   ADDENDUM REPORT: 04/15/2018 11:34 ADDENDUM: Study discussed by telephone with Dr. Horris Latino on 04/15/2018 at 1130 hours. Electronically Signed   By: Genevie Ann M.D.   On: 04/15/2018 11:34   Result Date: 04/15/2018 CLINICAL DATA:  84 year old female with small bowel obstruction. EXAM: PORTABLE ABDOMEN - 1 VIEW COMPARISON:  Radiograph 04/13/2018 and earlier. CT Abdomen and Pelvis 04/08/2018, 03/05/2017. FINDINGS: Portable AP supine view at 0927 hours. There remain right upper quadrant and right abdominal dilated small bowel loops measuring 50-56 millimeters diameter. The enteric tube has been removed. This patient has had prior partial sigmoidectomy for volvulus. On 04/08/2018 virtually all of the small bowel was within the right abdomen, a change since 03/05/2017. large bowel-gas pattern remains within normal limits. Left lung base appears stable. Chronic elevation of the right hemidiaphragm. Stable visualized osseous structures. No definite pneumoperitoneum on this supine view. IMPRESSION: 1. Appearance suspicious for a midgut volvulus with obstructed small bowel in the right abdomen. 2. No pneumoperitoneum identified on this supine view. Electronically Signed: By: Genevie Ann M.D. On: 04/15/2018 11:25      [place] M 04/17/2018  Patient ID: Erika Day, female   DOB: November 05, 1929, 84 y.o.   MRN: [id number]

## 2018-04-17 NOTE — Progress Notes (Signed)
Peripherally Inserted Central Catheter/Midline Placement  The IV Nurse has discussed with the patient and/or persons authorized to consent for the patient, the purpose of this procedure and the potential benefits and risks involved with this procedure.  The benefits include less needle sticks, lab draws from the catheter, and the patient may be discharged home with the catheter. Risks include, but not limited to, infection, bleeding, blood clot (thrombus formation), and puncture of an artery; nerve damage and irregular heartbeat and possibility to perform a PICC exchange if needed/ordered by physician.  Alternatives to this procedure were also discussed.  Bard Power PICC patient education guide, fact sheet on infection prevention and patient information card has been provided to patient /or left at bedside.    PICC/Midline Placement Documentation  PICC Double Lumen 04/17/18 PICC Right Brachial 36 cm 0 cm (Active)  Indication for Insertion or Continuance of Line Limited venous access - need for IV therapy >5 days (PICC only) 04/17/2018  4:21 PM  Exposed Catheter (cm) 0 cm 04/17/2018  4:21 PM  Site Assessment Clean;Dry;Intact 04/17/2018  4:21 PM  Lumen #1 Status Flushed;Saline locked;Blood return noted 04/17/2018  4:21 PM  Lumen #2 Status Saline locked;Flushed;Blood return noted 04/17/2018  4:21 PM  Dressing Type Transparent 04/17/2018  4:21 PM  Dressing Status Clean;Dry;Intact;Antimicrobial disc in place 04/17/2018  4:21 PM  Line Care Connections checked and tightened 04/17/2018  4:21 PM  Line Adjustment (NICU/IV Team Only) No 04/17/2018  4:21 PM  Dressing Intervention New dressing 04/17/2018  4:21 PM  Dressing Change Due 04/24/18 04/17/2018  4:21 PM       Rolena Infante 04/17/2018, 4:22 PM

## 2018-04-18 ENCOUNTER — Other Ambulatory Visit: Payer: Medicare Other

## 2018-04-18 ENCOUNTER — Inpatient Hospital Stay (HOSPITAL_COMMUNITY): Payer: Medicare Other

## 2018-04-18 LAB — CBC WITH DIFFERENTIAL/PLATELET
Basophils Absolute: 0 10*3/uL (ref 0.0–0.1)
Basophils Relative: 0 %
Eosinophils Absolute: 0.1 10*3/uL (ref 0.0–0.7)
Eosinophils Relative: 1 %
HEMATOCRIT: 27.6 % — AB (ref 36.0–46.0)
HEMOGLOBIN: 9.1 g/dL — AB (ref 12.0–15.0)
LYMPHS ABS: 0.4 10*3/uL — AB (ref 0.7–4.0)
Lymphocytes Relative: 8 %
MCH: 29.4 pg (ref 26.0–34.0)
MCHC: 33 g/dL (ref 30.0–36.0)
MCV: 89 fL (ref 78.0–100.0)
MONO ABS: 0.5 10*3/uL (ref 0.1–1.0)
MONOS PCT: 10 %
NEUTROS ABS: 4.5 10*3/uL (ref 1.7–7.7)
NEUTROS PCT: 81 %
Platelets: 124 10*3/uL — ABNORMAL LOW (ref 150–400)
RBC: 3.1 MIL/uL — ABNORMAL LOW (ref 3.87–5.11)
RDW: 13.6 % (ref 11.5–15.5)
WBC: 5.5 10*3/uL (ref 4.0–10.5)

## 2018-04-18 LAB — BASIC METABOLIC PANEL
ANION GAP: 1 — AB (ref 5–15)
ANION GAP: 4 — AB (ref 5–15)
BUN: 18 mg/dL (ref 6–20)
BUN: 17 mg/dL (ref 6–20)
CALCIUM: 7.7 mg/dL — AB (ref 8.9–10.3)
CALCIUM: 8.5 mg/dL — AB (ref 8.9–10.3)
CHLORIDE: 111 mmol/L (ref 101–111)
CO2: 20 mmol/L — ABNORMAL LOW (ref 22–32)
CO2: 21 mmol/L — ABNORMAL LOW (ref 22–32)
Chloride: 112 mmol/L — ABNORMAL HIGH (ref 101–111)
Creatinine, Ser: 0.92 mg/dL (ref 0.44–1.00)
Creatinine, Ser: 0.87 mg/dL (ref 0.44–1.00)
GFR calc Af Amer: >60 mL/min (ref >60)
GFR calc non Af Amer: 55 mL/min — ABNORMAL LOW (ref >60)
GFR, EST NON AFRICAN AMERICAN: 59 mL/min — AB (ref >60)
GLUCOSE: 409 mg/dL — AB (ref 65–99)
GLUCOSE: 78 mg/dL (ref 65–99)
POTASSIUM: 3.9 mmol/L (ref 3.5–5.1)
Potassium: 5.4 mmol/L — ABNORMAL HIGH (ref 3.5–5.1)
SODIUM: 137 mmol/L (ref 135–145)
Sodium: 132 mmol/L — ABNORMAL LOW (ref 135–145)

## 2018-04-18 LAB — GLUCOSE, CAPILLARY
GLUCOSE-CAPILLARY: 78 mg/dL (ref 65–99)
GLUCOSE-CAPILLARY: 72 mg/dL (ref 65–99)
GLUCOSE-CAPILLARY: 97 mg/dL (ref 65–99)
Glucose-Capillary: 125 mg/dL — ABNORMAL HIGH (ref 65–99)

## 2018-04-18 MED ORDER — FENTANYL CITRATE (PF) 100 MCG/2ML IJ SOLN
6.2500 ug | INTRAMUSCULAR | Status: DC | PRN
  Administered 2018-04-19 (×2): 12.5 ug via INTRAVENOUS
  Filled 2018-04-18 (×2): qty 2

## 2018-04-18 MED ORDER — KCL IN DEXTROSE-NACL 20-5-0.45 MEQ/L-%-% IV SOLN
INTRAVENOUS | Status: DC
  Administered 2018-04-18 – 2018-04-19 (×2): via INTRAVENOUS
  Filled 2018-04-18 (×2): qty 1000

## 2018-04-18 MED ORDER — SODIUM CHLORIDE 0.9 % IV SOLN: INTRAVENOUS | Status: DC

## 2018-04-18 MED ORDER — ACETAMINOPHEN 160 MG/5ML PO SOLN
650.0000 mg | Freq: Four times a day (QID) | ORAL | Status: DC
Start: 2018-04-18 — End: 2018-04-19
  Administered 2018-04-18 – 2018-04-19 (×3): 650 mg
  Filled 2018-04-18 (×3): qty 20.3

## 2018-04-18 NOTE — Progress Notes (Signed)
PROGRESS NOTE    Erika Day  KYH:[id number] DOB: May 15, 1930 DOA: 04/08/2018 PCP: Mack Hook, MD   Brief Narrative:  84 year old female with a history of marginal zone lymphoma, MGUS,diabetes, hypertension,previous history of sigmoid colectomy due to second recurrence of sigmoid volvulus in 2017,who presented to the ER with chief complaint of right upper quadrant abdominal pain associated with an episode of vomiting and diarrhea. Patient describes a feeling of being constipated. CT abdomen pelvis shows multiple dilated small bowel loops in the abdomen and pelvis, suspicious for developing small bowel obstruction. Surgery was consulted and NGT Placed and patient undewent Small Bowel Protocol. Pt admitted for further management.    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)   Sigmoid volvulus s/p lap sigmoid colectomy 06/30/2016   Volvulus of sigmoid colon (HCC)   MGUS (monoclonal gammopathy of unknown significance)  Acute Small Bowel Obstruction likely 2/2 ??midgut volvulus S/P exp lap with lysis of adhesions on 04/16/18  Extensive prior surgical history,  including history of recurrent sigmoid volvulus, status post sigmoid colectomy General Surgery on board: due to new findings of 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, proceeded for exp lap Continue IVF, NPO Pain management, DVT ppx as per general surgery  Hypotension Likely due to narcotics post op Warm peripheries, no signs of sepsis Continue IVF, limit narcotics as much as possible  Essential Hypertension BP soft Hold Home Lisinopril  Osteoarthritis Continue Acetaminophen 650 mg PR or pO  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 / CLL/ MGUS Patient is followed by  Dr.Gorsuch  CKD Stage 3 Stable Avoid nephrotoxic  medications, hold Lisinopril Daily BMP  GERD Continue Famotidine IV 20 mg q12h  Leukopenia Resolved Monitor CBC   Normocytic Anemia of ckd/Acute blood loss Drop from 12-->9.4, likely due to post op, dilutional Type and screen done, transfuse if <7 Monitor CBC  Non-Gap Metabolic Acidosis Resolved    DVT prophylaxis: Enoxaparin 40 mg sq q24h Code Status: FULL CODE Family Communication: No family present at bedside  Disposition Plan: Remain Inpatient for continued management  Consultants:   General Surgery    Procedures: Ex-lap on 04/16/18   Antimicrobials:  Anti-infectives (From admission, onward)   Start     Dose/Rate Route Frequency Ordered Stop   04/16/18 1030  ertapenem (INVANZ) 1 g in sodium chloride 0.9 % 100 mL IVPB     1 g 200 mL/hr over 30 Minutes Intravenous On call to O.R. 04/16/18 1016 04/16/18 2113   04/08/18 1715  vancomycin (VANCOCIN) 1,250 mg in sodium chloride 0.9 % 250 mL IVPB  Status:  Discontinued     1,250 mg 166.7 mL/hr over 90 Minutes Intravenous  Once 04/08/18 1711 04/08/18 1732   04/08/18 1715  cefTRIAXone (ROCEPHIN) 1 g in sodium chloride 0.9 % 100 mL IVPB  Status:  Discontinued     1 g 200 mL/hr over 30 Minutes Intravenous  Once 04/08/18 1711 04/08/18 1732   04/08/18 1715  ampicillin (OMNIPEN) 1 g in sodium chloride 0.9 % 100 mL IVPB  Status:  Discontinued     1 g 300 mL/hr over 20 Minutes Intravenous  Once 04/08/18 1711 04/08/18 1728     Subjective:  Patient seen at bedside post op, sleeping, easily arousable, obeys commands, still c/o abdominal pain. Very sensitive to narcotics.  Objective: Vitals:   04/18/18 1500 04/18/18 1600 04/18/18 1700 04/18/18 1800  BP: (!) 153/75 102/67 (!) 119/42 (!) 126/44  Pulse: 98 66  73  Resp: (!) 24 15 (!) 24 15  Temp:  (!) 97.5 F (36.4 C)    TempSrc:  Oral    SpO2: 91% 95%  97%  Weight:      Height:        Intake/Output Summary (Last 24 hours) at 04/18/2018 2003 Last data filed at 04/18/2018  1800 Gross per 24 hour  Intake 1200 ml  Output 945 ml  Net 255 ml   Filed Weights   04/08/18 1249 04/08/18 2000 04/16/18 1417  Weight: 63 kg (139 lb) 72 kg (158 lb 11.7 oz) 74.1 kg (163 lb 5.8 oz)   Examination: Physical Exam:  Constitutional: Mild distress due to pain   Eyes: PERRLA.  No jaundice.   ENMT: External ears and nose appear normal Neck: Appears supple with no JVD, Left central line in place Respiratory: Diminished to auscultation bilaterally with no appreciable wheezing, rales, rhonchi. Cardiovascular: Regular rate and rhythm; Has a 2/6 Systolic Murmur.  No lower extremity edema noted  Abdomen: Soft, tender to palpate, non-distended, hypoactive BS, honecomb dressing intact, NGT in place Musculoskeletal: No contractures or cyanosis noted.  No joint deformities Skin: Skin is warm and dry with no appreciable rashes or lesions on limited skin evaluation Neurologic: Patient moves all limbs.   Psychiatric: Normal mood and affect.  Intact judgment intact  Data Reviewed: I have personally reviewed following labs and imaging studies  CBC: Recent Labs  Lab 04/13/18 0435 04/15/18 0450 04/16/18 0423 04/17/18 0617 04/18/18 0313  WBC 3.4* 4.3 3.4* 8.1 5.5  NEUTROABS 2.2 2.9 2.3 6.5 4.5  HGB 11.1* 12.3 12.7 9.4* 9.1*  HCT 34.2* 37.6 38.0 29.4* 27.6*  MCV 88.6 88.7 86.8 88.6 89.0  PLT 148* 156 PLATELET CLUMPS NOTED ON SMEAR, COUNT APPEARS DECREASED 159 157*   Basic Metabolic Panel: Recent Labs  Lab 04/12/18 0426 04/13/18 0435 04/15/18 0450 04/16/18 0423 04/17/18 0443 04/18/18 0313 04/18/18 0800  NA 143 144 133* 132* 135 132* 137  K 3.9 3.7 4.0 3.9 3.8 5.4* 3.9  CL 112* 112* 102 103 111 111 112*  CO2 18* 24 25 22  21* 20* 21*  GLUCOSE 62* 100* 120* 119* 119* 409* 78  BUN 21* 19 22* 21* 23* 18 17  CREATININE 1.11* 1.08* 1.11* 1.06* 1.26* 0.92 0.87  CALCIUM 9.3 9.3 9.2 9.1 7.8* 7.7* 8.5*  MG 2.0 2.0 1.8  --   --   --   --   PHOS 2.7 2.0*  --   --   --   --   --      GFR: Estimated Creatinine Clearance: 46.8 mL/min (by C-G formula based on SCr of 0.87 mg/dL). Liver Function Tests: Recent Labs  Lab 04/12/18 0426 04/13/18 0435  AST 25 22  ALT 16 15  ALKPHOS 48 45  BILITOT 0.9 0.5  PROT 6.4* 6.4*  ALBUMIN 3.5 3.5   No results for input(s): LIPASE, AMYLASE in the last 168 hours. No results for input(s): AMMONIA in the last 168 hours. Coagulation Profile: No results for input(s): INR, PROTIME in the last 168 hours. Cardiac Enzymes: No results for input(s): CKTOTAL, CKMB, CKMBINDEX, TROPONINI in the last 168 hours. BNP (last 3 results) No results for input(s): PROBNP in the last 8760 hours. HbA1C: No results for input(s): HGBA1C in the last 72 hours. CBG: Recent Labs  Lab 04/17/18  1120 04/17/18 1632 04/18/18 0753 04/18/18 1148 04/18/18 1808  GLUCAP 96 79 78 72 97   Lipid Profile: No results for input(s): CHOL, HDL, LDLCALC, TRIG, CHOLHDL, LDLDIRECT in the last 72 hours. Thyroid Function Tests: No results for input(s): TSH, T4TOTAL, FREET4, T3FREE, THYROIDAB in the last 72 hours. Anemia Panel: No results for input(s): VITAMINB12, FOLATE, FERRITIN, TIBC, IRON, RETICCTPCT in the last 72 hours. Sepsis Labs: No results for input(s): PROCALCITON, LATICACIDVEN in the last 168 hours.  Recent Results (from the past 240 hour(s))  MRSA PCR Screening     Status: None   Collection Time: 04/16/18  2:55 PM  Result Value Ref Range Status   MRSA by PCR NEGATIVE NEGATIVE Final    Comment:        The GeneXpert MRSA Assay (FDA approved for NASAL specimens only), is one component of a comprehensive MRSA colonization surveillance program. It is not intended to diagnose MRSA infection nor to guide or monitor treatment for MRSA infections. Performed at [facility], [place] [address], [place], [place] [address]      Radiology Studies: Dg Chest Port 1 View  Result Date: 04/18/2018 CLINICAL DATA:  Increased shortness of breath  this morning. EXAM: PORTABLE CHEST 1 VIEW COMPARISON:  Chest x-ray dated April 16, 2018. FINDINGS: Interval placement of a right upper extremity PICC line with the tip in the proximal right atrium. Interval removal of the left internal jugular central venous catheter. Unchanged enteric tube entering the stomach. Stable cardiomediastinal silhouette. Shallow inspiration with unchanged severe elevation of the right hemidiaphragm and right basilar atelectasis. Slightly increased left basilar atelectasis. No pleural effusion or pneumothorax. No acute osseous abnormality. Contrast within the hepatic flexure. Pneumoperitoneum has resolved. IMPRESSION: 1. Slightly worsened left basilar atelectasis. 2. Unchanged severe elevation of the right hemidiaphragm and right basilar atelectasis. Electronically Signed   By: Titus Dubin M.D.   On: 04/18/2018 08:13   [place] Ekg Site Rite  Result Date: 04/17/2018 If Site Rite image not attached, placement could not be confirmed due to current cardiac rhythm.  Scheduled Meds: . acetaminophen (TYLENOL) oral liquid 160 mg/5 mL  650 mg Per Tube Q6H  . chlorhexidine  15 mL Mouth Rinse BID  . Chlorhexidine Gluconate Cloth  6 each Topical Daily  . enoxaparin (LOVENOX) injection  40 mg Subcutaneous Q24H  . mouth rinse  15 mL Mouth Rinse q12n4p   Continuous Infusions: . dextrose 5 % and 0.45 % NaCl with KCl 20 mEq/L 75 mL/hr at 04/18/18 0915  . famotidine (PEPCID) IV Stopped (04/18/18 0949)    LOS: 10 days   Adline Peals [place],MD [facility]  If 7PM-7AM, please contact night-coverage [email or web address] Password [facility] 04/18/2018, 8:03 PM

## 2018-04-18 NOTE — Progress Notes (Signed)
Not much coming from her NG but she is zonked out on pain meds, slow to wake up. Upper airway congestion present;  she has a hard time coughing and clearing this  She has had two doses of fentanyl today (12.5mg  each;) the last was around 3 PM for complaints of pain.  I will decrease fentanyl and add some IV tylenol.  BS still pretty hypoactive.  Watch her with NG clamped tonight, See if she is more alert in AM.

## 2018-04-18 NOTE — Plan of Care (Signed)
Patients BP improved from prior night. Other vitals remain stable as well.  Patient became more interactive and answered orientation question appropriately.  Abdomen was mildly sore but better with reposition.

## 2018-04-18 NOTE — Progress Notes (Signed)
General Surgery [facility]  Assessment & Plan: POD#2 status post ex lap with lysis of adhesions for SBO (Gerkin 04/16/18)  Try clamping trials today given flatus and minimal belching.  NGT output coming down.  OOB to chair today  Await resolution of ileus Per medical service        Milus Height, MD [facility]       Office: [phone]    Chief Complaint: Small bowel obstruction  Subjective: + flatus several times overnight.  Objective: Vital signs in last 24 hours: Temp:  [97.8 F (36.6 C)-98.1 F (36.7 C)] 97.8 F (36.6 C) (06/17 0801) Pulse Rate:  [75-98] 82 (06/17 0800) Resp:  [14-30] 20 (06/17 0800) BP: (87-134)/(40-66) 131/66 (06/17 0800) SpO2:  [95 %-99 %] 95 % (06/17 0800) Last BM Date: 04/13/18  Intake/Output from previous day: 06/16 0701 - 06/17 0700 In: 2225 [I.V.:1725; NG/GT:400; IV Piggyback:100] Out: 1025 [Urine:650; Emesis/NG output:375] Intake/Output this shift: No intake/output data recorded.  Physical Exam: HEENT - sclerae clear, mucous membranes moist Resp- breathing comfortably CV RR&R Abdomen - soft, wound dry and intact; minimally distended.   Lab Results:  Recent Labs    04/17/18 0617 04/18/18 0313  WBC 8.1 5.5  HGB 9.4* 9.1*  HCT 29.4* 27.6*  PLT 159 124*   BMET Recent Labs    04/17/18 0443 04/18/18 0313  NA 135 132*  K 3.8 5.4*  CL 111 111  CO2 21* 20*  GLUCOSE 119* 409*  BUN 23* 18  CREATININE 1.26* 0.92  CALCIUM 7.8* 7.7*   PT/INR No results for input(s): LABPROT, INR in the last 72 hours. Comprehensive Metabolic Panel:    Component Value Date/Time   NA 132 (L) 04/18/2018 0313   NA 135 04/17/2018 0443   NA 135 12/07/2017 1427   NA 140 11/17/2017 1222   NA 141 02/19/2017 1111   NA 142 02/12/2017 0918   K 5.4 (H) 04/18/2018 0313   K 3.8 04/17/2018 0443   K 4.3 02/19/2017 1111   K 4.4 02/12/2017 0918   CL 111 04/18/2018 0313   CL  111 04/17/2018 0443   CO2 20 (L) 04/18/2018 0313   CO2 21 (L) 04/17/2018 0443   CO2 26 02/19/2017 1111   CO2 26 02/12/2017 0918   BUN 18 04/18/2018 0313   BUN 23 (H) 04/17/2018 0443   BUN 20 12/07/2017 1427   BUN 14 11/17/2017 1222   BUN 16.7 02/19/2017 1111   BUN 10.4 02/12/2017 0918   CREATININE 0.92 04/18/2018 0313   CREATININE 1.26 (H) 04/17/2018 0443   CREATININE 0.9 02/19/2017 1111   CREATININE 0.9 02/12/2017 0918   GLUCOSE 409 (H) 04/18/2018 0313   GLUCOSE 119 (H) 04/17/2018 0443   GLUCOSE 72 02/19/2017 1111   GLUCOSE 107 02/12/2017 0918   CALCIUM 7.7 (L) 04/18/2018 0313   CALCIUM 7.8 (L) 04/17/2018 0443   CALCIUM 10.2 02/19/2017 1111   CALCIUM 11.4 (H) 02/12/2017 0918   AST 22 04/13/2018 0435   AST 25 04/12/2018 0426   AST 14 02/19/2017 1111   AST 17 02/12/2017 0918   ALT 15 04/13/2018 0435   ALT 16 04/12/2018 0426   ALT 8 02/19/2017 1111   ALT 8 02/12/2017 0918   ALKPHOS 45 04/13/2018 0435   ALKPHOS 48 04/12/2018 0426  ALKPHOS 92 02/19/2017 1111   ALKPHOS 119 02/12/2017 0918   BILITOT 0.5 04/13/2018 0435   BILITOT 0.9 04/12/2018 0426   BILITOT 0.3 08/23/2017 1542   BILITOT 0.36 02/19/2017 1111   BILITOT 0.74 02/12/2017 0918   PROT 6.4 (L) 04/13/2018 0435   PROT 6.4 (L) 04/12/2018 0426   PROT 7.3 08/23/2017 1542   PROT 7.5 02/19/2017 1111   PROT 7.1 02/19/2017 1111   PROT 9.4 (H) 02/12/2017 0918   ALBUMIN 3.5 04/13/2018 0435   ALBUMIN 3.5 04/12/2018 0426   ALBUMIN 4.1 08/23/2017 1542   ALBUMIN 3.8 02/19/2017 1111   ALBUMIN 4.7 02/12/2017 0918    Studies/Results: Dg Chest Port 1 View  Result Date: 04/18/2018 CLINICAL DATA:  Increased shortness of breath this morning. EXAM: PORTABLE CHEST 1 VIEW COMPARISON:  Chest x-ray dated April 16, 2018. FINDINGS: Interval placement of a right upper extremity PICC line with the tip in the proximal right atrium. Interval removal of the left internal jugular central venous catheter. Unchanged enteric tube entering the  stomach. Stable cardiomediastinal silhouette. Shallow inspiration with unchanged severe elevation of the right hemidiaphragm and right basilar atelectasis. Slightly increased left basilar atelectasis. No pleural effusion or pneumothorax. No acute osseous abnormality. Contrast within the hepatic flexure. Pneumoperitoneum has resolved. IMPRESSION: 1. Slightly worsened left basilar atelectasis. 2. Unchanged severe elevation of the right hemidiaphragm and right basilar atelectasis. Electronically Signed   By: Titus Dubin M.D.   On: 04/18/2018 08:13   Dg Chest Port 1 View  Result Date: 04/16/2018 CLINICAL DATA:  84 y/o F; central venous line. Status post exploratory laparotomy. EXAM: PORTABLE CHEST 1 VIEW COMPARISON:  04/15/2018 chest radiograph. FINDINGS: Pneumoperitoneum, likely postoperative. Enteric tube tip extends below the field of view into the abdomen. Left central venous catheter tip projects over the left brachiocephalic SVC junction. Calcific atherosclerosis of the aorta. Elevated right hemidiaphragm with right lung base atelectasis. Multilevel degenerative changes of the spine. No pleural effusion or pneumothorax. IMPRESSION: 1. Left central venous catheter tip projects over the left brachiocephalic/SVC junction. 2. Pneumoperitoneum, likely postoperative. 3. Stable elevated right hemidiaphragm with right basilar atelectasis. Electronically Signed   By: Kristine Garbe M.D.   On: 04/16/2018 13:24   [place] Ekg Site Rite  Result Date: 04/17/2018 If Site Rite image not attached, placement could not be confirmed due to current cardiac rhythm.     Stark Klein 04/18/2018  Patient ID: Erika Day, female   DOB: 12/26/1929, 84 y.o.   MRN: [id number]

## 2018-04-19 LAB — BASIC METABOLIC PANEL
Anion gap: <3 — ABNORMAL LOW (ref 5–15)
Anion gap: 5 (ref 5–15)
BUN: 14 mg/dL (ref 6–20)
BUN: 14 mg/dL (ref 6–20)
CO2: 21 mmol/L — ABNORMAL LOW (ref 22–32)
CO2: 21 mmol/L — ABNORMAL LOW (ref 22–32)
CREATININE: 0.84 mg/dL (ref 0.44–1.00)
Calcium: 8.1 mg/dL — ABNORMAL LOW (ref 8.9–10.3)
Calcium: 8.7 mg/dL — ABNORMAL LOW (ref 8.9–10.3)
Chloride: 112 mmol/L — ABNORMAL HIGH (ref 101–111)
Chloride: 112 mmol/L — ABNORMAL HIGH (ref 101–111)
Creatinine, Ser: 0.85 mg/dL (ref 0.44–1.00)
GFR calc Af Amer: >60 mL/min (ref >60)
GFR calc non Af Amer: >60 mL/min (ref >60)
Glucose, Bld: 382 mg/dL — ABNORMAL HIGH (ref 65–99)
Glucose, Bld: 110 mg/dL — ABNORMAL HIGH (ref 65–99)
POTASSIUM: 4.1 mmol/L (ref 3.5–5.1)
Potassium: 5.3 mmol/L — ABNORMAL HIGH (ref 3.5–5.1)
SODIUM: 134 mmol/L — AB (ref 135–145)
SODIUM: 138 mmol/L (ref 135–145)

## 2018-04-19 LAB — CBC WITH DIFFERENTIAL/PLATELET
BASOS PCT: 0 %
Basophils Absolute: 0 10*3/uL (ref 0.0–0.1)
EOS ABS: 0.1 10*3/uL (ref 0.0–0.7)
Eosinophils Relative: 1 %
HCT: 27.1 % — ABNORMAL LOW (ref 36.0–46.0)
HEMOGLOBIN: 9 g/dL — AB (ref 12.0–15.0)
Lymphocytes Relative: 13 %
Lymphs Abs: 0.6 10*3/uL — ABNORMAL LOW (ref 0.7–4.0)
MCH: 29.1 pg (ref 26.0–34.0)
MCHC: 33.2 g/dL (ref 30.0–36.0)
MCV: 87.7 fL (ref 78.0–100.0)
Monocytes Absolute: 0.4 10*3/uL (ref 0.1–1.0)
Monocytes Relative: 10 %
NEUTROS PCT: 76 %
Neutro Abs: 3.3 10*3/uL (ref 1.7–7.7)
Platelets: 143 10*3/uL — ABNORMAL LOW (ref 150–400)
RBC: 3.09 MIL/uL — ABNORMAL LOW (ref 3.87–5.11)
RDW: 13.6 % (ref 11.5–15.5)
WBC: 4.4 10*3/uL (ref 4.0–10.5)

## 2018-04-19 LAB — GLUCOSE, CAPILLARY
GLUCOSE-CAPILLARY: 109 mg/dL — AB (ref 65–99)
GLUCOSE-CAPILLARY: 109 mg/dL — AB (ref 65–99)
GLUCOSE-CAPILLARY: 95 mg/dL (ref 65–99)
Glucose-Capillary: 112 mg/dL — ABNORMAL HIGH (ref 65–99)
Glucose-Capillary: 114 mg/dL — ABNORMAL HIGH (ref 65–99)

## 2018-04-19 MED ORDER — FUROSEMIDE 10 MG/ML IJ SOLN
40.0000 mg | Freq: Once | INTRAMUSCULAR | Status: AC
  Administered 2018-04-19: 40 mg via INTRAVENOUS
  Filled 2018-04-19: qty 4

## 2018-04-19 MED ORDER — TRAMADOL HCL 50 MG PO TABS
50.0000 mg | ORAL_TABLET | Freq: Four times a day (QID) | ORAL | Status: DC | PRN
  Administered 2018-04-21: 50 mg via ORAL
  Filled 2018-04-19: qty 1

## 2018-04-19 MED ORDER — LISINOPRIL 10 MG PO TABS
10.0000 mg | ORAL_TABLET | Freq: Every day | ORAL | Status: DC
  Administered 2018-04-19 – 2018-04-21 (×3): 10 mg via ORAL
  Filled 2018-04-19 (×4): qty 1

## 2018-04-19 MED ORDER — HYDRALAZINE HCL 20 MG/ML IJ SOLN
10.0000 mg | Freq: Three times a day (TID) | INTRAMUSCULAR | Status: DC | PRN
  Administered 2018-04-20 (×2): 10 mg via INTRAVENOUS
  Filled 2018-04-19 (×2): qty 1

## 2018-04-19 MED ORDER — HYDRALAZINE HCL 20 MG/ML IJ SOLN
10.0000 mg | INTRAMUSCULAR | Status: DC | PRN
  Administered 2018-04-19: 10 mg via INTRAVENOUS
  Filled 2018-04-19: qty 1

## 2018-04-19 MED ORDER — ACETAMINOPHEN 325 MG PO TABS
650.0000 mg | ORAL_TABLET | Freq: Three times a day (TID) | ORAL | Status: DC
  Filled 2018-04-19 (×2): qty 2

## 2018-04-19 MED ORDER — HYDRALAZINE HCL 20 MG/ML IJ SOLN: 10.0000 mg | Freq: Three times a day (TID) | INTRAMUSCULAR | Status: DC | PRN

## 2018-04-19 NOTE — Progress Notes (Signed)
[facility] Progress Note  3 Days Post-Op  Subjective: CC:  +flatus. Denies N/V. Denies BM. Denies abd pain.  Objective: Vital signs in last 24 hours: Temp:  [97.4 F (36.3 C)-97.8 F (36.6 C)] 97.5 F (36.4 C) (06/18 0400) Pulse Rate:  [66-98] 69 (06/18 0100) Resp:  [15-24] 19 (06/18 0100) BP: (102-153)/(42-75) 124/62 (06/18 0100) SpO2:  [91 %-100 %] 100 % (06/18 0100) Last BM Date: 04/13/18  Intake/Output from previous day: 06/17 0701 - 06/18 0700 In: 275  Out: 795 [Urine:715; Emesis/NG output:80] Intake/Output this shift: No intake/output data recorded.  PE: Gen:  Alert, NAD, pleasant Pulm:  Normal effort Abd: Soft, appropriately tender, non-distended, bowel sounds present, midline incision w/ staples and honeycomb - dried sanguinous drainage but no active drainage.  Skin: warm and dry, no rashes   Psych: A&Ox3   Lab Results:  Recent Labs    04/18/18 0313 04/19/18 0318  WBC 5.5 4.4  HGB 9.1* 9.0*  HCT 27.6* 27.1*  PLT 124* 143*   BMET Recent Labs    04/18/18 0800 04/19/18 0318  NA 137 134*  K 3.9 5.3*  CL 112* 112*  CO2 21* 21*  GLUCOSE 78 382*  BUN 17 14  CREATININE 0.87 0.84  CALCIUM 8.5* 8.1*    CMP     Component Value Date/Time   NA 134 (L) 04/19/2018 0318   NA 135 12/07/2017 1427   NA 141 02/19/2017 1111   K 5.3 (H) 04/19/2018 0318   K 4.3 02/19/2017 1111   CL 112 (H) 04/19/2018 0318   CO2 21 (L) 04/19/2018 0318   CO2 26 02/19/2017 1111   GLUCOSE 382 (H) 04/19/2018 0318   GLUCOSE 72 02/19/2017 1111   BUN 14 04/19/2018 0318   BUN 20 12/07/2017 1427   BUN 16.7 02/19/2017 1111   CREATININE 0.84 04/19/2018 0318   CREATININE 0.9 02/19/2017 1111   CALCIUM 8.1 (L) 04/19/2018 0318   CALCIUM 10.2 02/19/2017 1111   PROT 6.4 (L) 04/13/2018 0435   PROT 7.3 08/23/2017 1542   PROT 7.5 02/19/2017 1111   ALBUMIN 3.5 04/13/2018 0435   ALBUMIN 4.1 08/23/2017 1542   ALBUMIN 3.8 02/19/2017 1111   AST 22 04/13/2018 0435   AST 14  02/19/2017 1111   ALT 15 04/13/2018 0435   ALT 8 02/19/2017 1111   ALKPHOS 45 04/13/2018 0435   ALKPHOS 92 02/19/2017 1111   BILITOT 0.5 04/13/2018 0435   BILITOT 0.3 08/23/2017 1542   BILITOT 0.36 02/19/2017 1111   GFRNONAA >60 04/19/2018 0318   GFRAA >60 04/19/2018 0318   Lipase     Component Value Date/Time   LIPASE 47 04/08/2018 1430   Studies/Results: Dg Chest Port 1 View  Result Date: 04/18/2018 CLINICAL DATA:  Increased shortness of breath this morning. EXAM: PORTABLE CHEST 1 VIEW COMPARISON:  Chest x-ray dated April 16, 2018. FINDINGS: Interval placement of a right upper extremity PICC line with the tip in the proximal right atrium. Interval removal of the left internal jugular central venous catheter. Unchanged enteric tube entering the stomach. Stable cardiomediastinal silhouette. Shallow inspiration with unchanged severe elevation of the right hemidiaphragm and right basilar atelectasis. Slightly increased left basilar atelectasis. No pleural effusion or pneumothorax. No acute osseous abnormality. Contrast within the hepatic flexure. Pneumoperitoneum has resolved. IMPRESSION: 1. Slightly worsened left basilar atelectasis. 2. Unchanged severe elevation of the right hemidiaphragm and right basilar atelectasis. Electronically Signed   By: Titus Dubin M.D.   On: 04/18/2018 08:13   [place]  Ekg Site Rite  Result Date: 04/17/2018 If Site Rite image not attached, placement could not be confirmed due to current cardiac rhythm.  Anti-infectives: Anti-infectives (From admission, onward)   Start     Dose/Rate Route Frequency Ordered Stop   04/16/18 1030  ertapenem (INVANZ) 1 g in sodium chloride 0.9 % 100 mL IVPB     1 g 200 mL/hr over 30 Minutes Intravenous On call to O.R. 04/16/18 1016 04/16/18 2113   04/08/18 1715  vancomycin (VANCOCIN) 1,250 mg in sodium chloride 0.9 % 250 mL IVPB  Status:  Discontinued     1,250 mg 166.7 mL/hr over 90 Minutes Intravenous  Once 04/08/18 1711  04/08/18 1732   04/08/18 1715  cefTRIAXone (ROCEPHIN) 1 g in sodium chloride 0.9 % 100 mL IVPB  Status:  Discontinued     1 g 200 mL/hr over 30 Minutes Intravenous  Once 04/08/18 1711 04/08/18 1732   04/08/18 1715  ampicillin (OMNIPEN) 1 g in sodium chloride 0.9 % 100 mL IVPB  Status:  Discontinued     1 g 300 mL/hr over 20 Minutes Intravenous  Once 04/08/18 1711 04/08/18 1728     Assessment/Plan POD#3 status post ex lap with lysis of adhesions for SBO (Gerkin 04/16/18) -  Afebrile, VSS -  No emesis with NG clamping trial, only 80 cc/24h from NG tube -- D/C NG tube  -  Having flatus, await BM and start bowel regimen  -  Start clears  -  OOB to chair, PT/OT evals  -  IS       LOS: 11 days    Jill Alexanders , [facility] 04/19/2018, 7:14 AM Pager: [phone] Consults: [phone] Mon-Fri 7:00 am-4:30 pm Sat-Sun 7:00 am-11:30 am

## 2018-04-19 NOTE — Progress Notes (Signed)
PROGRESS NOTE    Erika Day  BSW:[id number] DOB: [birth year] DOA: 04/08/2018 PCP: Mack Hook, MD   Brief Narrative:  84 year old female with a history of marginal zone lymphoma, MGUS,diabetes, hypertension,previous history of sigmoid colectomy due to second recurrence of sigmoid volvulus in 2017,who presented to the ER with chief complaint of right upper quadrant abdominal pain associated with an episode of vomiting and diarrhea. Patient describes a feeling of being constipated. CT abdomen pelvis shows multiple dilated small bowel loops in the abdomen and pelvis, suspicious for developing small bowel obstruction. Surgery was consulted and NGT Placed and patient undewent Small Bowel Protocol. Pt admitted for further management.    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)   Sigmoid volvulus s/p lap sigmoid colectomy 06/30/2016   Volvulus of sigmoid colon (HCC)   MGUS (monoclonal gammopathy of unknown significance)  Acute Small Bowel Obstruction likely 2/2 ??midgut volvulus S/P exp lap with lysis of adhesions on 04/16/18  Extensive prior surgical history,  including history of recurrent sigmoid volvulus, status post sigmoid colectomy General Surgery on board: due to new findings of 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, proceeded for exp lap Continue IVF, advanced diet  Pain management, DVT ppx as per general surgery  Hypotension Resolved Likely due to narcotics post op Warm peripheries, no signs of sepsis Continue IVF, limit narcotics as much as possible  Essential Hypertension Now uncontrolled Restart reduced dose of home Lisinopril 10 mg, pt takes 20 mg at home, IV hydralazine prn  Osteoarthritis Continue Acetaminophen 650 mg PR or pO  Small B-cell lymphoma of lymph nodes  of axilla / CLL/ MGUS Patient is followed by  Dr.Gorsuch  CKD Stage 3 Stable Avoid nephrotoxic medications Daily BMP  GERD Continue Famotidine IV 20 mg q12h  Leukopenia Resolved Monitor CBC   Normocytic Anemia of ckd/Acute blood loss Drop from 12-->9.4, likely due to post op, dilutional Type and screen done, transfuse if <7 Monitor CBC  Non-Gap Metabolic Acidosis Resolved    DVT prophylaxis: Enoxaparin 40 mg sq q24h Code Status: FULL CODE Family Communication: No family present at bedside  Disposition Plan: Remain Inpatient for continued management  Consultants:   General Surgery    Procedures: Ex-lap on 04/16/18   Antimicrobials:  Anti-infectives (From admission, onward)   Start     Dose/Rate Route Frequency Ordered Stop   04/16/18 1030  ertapenem (INVANZ) 1 g in sodium chloride 0.9 % 100 mL IVPB     1 g 200 mL/hr over 30 Minutes Intravenous On call to O.R. 04/16/18 1016 04/16/18 2113   04/08/18 1715  vancomycin (VANCOCIN) 1,250 mg in sodium chloride 0.9 % 250 mL IVPB  Status:  Discontinued     1,250 mg 166.7 mL/hr over 90 Minutes Intravenous  Once 04/08/18 1711 04/08/18 1732   04/08/18 1715  cefTRIAXone (ROCEPHIN) 1 g in sodium chloride 0.9 % 100 mL IVPB  Status:  Discontinued     1 g 200 mL/hr over 30 Minutes Intravenous  Once 04/08/18 1711 04/08/18 1732   04/08/18 1715  ampicillin (OMNIPEN) 1 g in sodium chloride 0.9 % 100 mL IVPB  Status:  Discontinued     1 g 300 mL/hr over 20 Minutes Intravenous  Once 04/08/18 1711 04/08/18 1728     Subjective:  Patient seen at bedside post op,  more alert, awake. Reports abdominal pain is controlled. Diet advanced, NGT removed   Objective: Vitals:   04/19/18 1300 04/19/18 1400 04/19/18 1500 04/19/18 1600  BP: (!) 131/44 (!) 160/71 (!) 156/70   Pulse: 68 87 83   Resp: 18 20 (!) 21   Temp:    (!) 97.5 F (36.4 C)  TempSrc:    Axillary  SpO2: 100% 100% 100%   Weight:      Height:        Intake/Output Summary  (Last 24 hours) at 04/19/2018 1826 Last data filed at 04/19/2018 1500 Gross per 24 hour  Intake 2157.92 ml  Output 1575 ml  Net 582.92 ml   Filed Weights   04/08/18 1249 04/08/18 2000 04/16/18 1417  Weight: 63 kg (139 lb) 72 kg (158 lb 11.7 oz) 74.1 kg (163 lb 5.8 oz)   Examination: Physical Exam:  Constitutional: NAD  Eyes: PERRLA.  No jaundice.   ENMT: External ears and nose appear normal Neck: Appears supple with no JVD, Left central line in place Respiratory: Diminished to auscultation bilaterally with no appreciable wheezing, rales, rhonchi. Cardiovascular: Regular rate and rhythm; Has a 2/6 Systolic Murmur.  No lower extremity edema noted  Abdomen: Soft, tender to palpate, non-distended, hypoactive BS, honecomb dressing intact Musculoskeletal: No contractures or cyanosis noted.  No joint deformities Skin: Skin is warm and dry with no appreciable rashes or lesions on limited skin evaluation Neurologic: Patient moves all limbs.   Psychiatric: Normal mood and affect.  Intact judgment intact  Data Reviewed: I have personally reviewed following labs and imaging studies  CBC: Recent Labs  Lab 04/15/18 0450 04/16/18 0423 04/17/18 0617 04/18/18 0313 04/19/18 0318  WBC 4.3 3.4* 8.1 5.5 4.4  NEUTROABS 2.9 2.3 6.5 4.5 3.3  HGB 12.3 12.7 9.4* 9.1* 9.0*  HCT 37.6 38.0 29.4* 27.6* 27.1*  MCV 88.7 86.8 88.6 89.0 87.7  PLT 156 PLATELET CLUMPS NOTED ON SMEAR, COUNT APPEARS DECREASED 159 124* 517*   Basic Metabolic Panel: Recent Labs  Lab 04/13/18 0435 04/15/18 0450  04/17/18 0443 04/18/18 0313 04/18/18 0800 04/19/18 0318 04/19/18 0645  NA 144 133*   < > 135 132* 137 134* 138  K 3.7 4.0   < > 3.8 5.4* 3.9 5.3* 4.1  CL 112* 102   < > 111 111 112* 112* 112*  CO2 24 25   < > 21* 20* 21* 21* 21*  GLUCOSE 100* 120*   < > 119* 409* 78 382* 110*  BUN 19 22*   < > 23* 18 17 14 14   CREATININE 1.08* 1.11*   < > 1.26* 0.92 0.87 0.84 0.85  CALCIUM 9.3 9.2   < > 7.8* 7.7* 8.5* 8.1*  8.7*  MG 2.0 1.8  --   --   --   --   --   --   PHOS 2.0*  --   --   --   --   --   --   --    < > = values in this interval not displayed.   GFR: Estimated Creatinine Clearance: 47.9 mL/min (by C-G formula based on SCr of 0.85 mg/dL). Liver Function Tests: Recent Labs  Lab 04/13/18 0435  AST 22  ALT 15  ALKPHOS 45  BILITOT 0.5  PROT 6.4*  ALBUMIN 3.5   No results for input(s): LIPASE, AMYLASE in the last 168 hours. No results for input(s): AMMONIA in the last 168 hours. Coagulation Profile: No results for input(s): INR, PROTIME in  the last 168 hours. Cardiac Enzymes: No results for input(s): CKTOTAL, CKMB, CKMBINDEX, TROPONINI in the last 168 hours. BNP (last 3 results) No results for input(s): PROBNP in the last 8760 hours. HbA1C: No results for input(s): HGBA1C in the last 72 hours. CBG: Recent Labs  Lab 04/18/18 0753 04/18/18 1148 04/18/18 1808 04/18/18 2359 04/19/18 1157  GLUCAP 78 72 97 95 112*   Lipid Profile: No results for input(s): CHOL, HDL, LDLCALC, TRIG, CHOLHDL, LDLDIRECT in the last 72 hours. Thyroid Function Tests: No results for input(s): TSH, T4TOTAL, FREET4, T3FREE, THYROIDAB in the last 72 hours. Anemia Panel: No results for input(s): VITAMINB12, FOLATE, FERRITIN, TIBC, IRON, RETICCTPCT in the last 72 hours. Sepsis Labs: No results for input(s): PROCALCITON, LATICACIDVEN in the last 168 hours.  Recent Results (from the past 240 hour(s))  MRSA PCR Screening     Status: None   Collection Time: 04/16/18  2:55 PM  Result Value Ref Range Status   MRSA by PCR NEGATIVE NEGATIVE Final    Comment:        The GeneXpert MRSA Assay (FDA approved for NASAL specimens only), is one component of a comprehensive MRSA colonization surveillance program. It is not intended to diagnose MRSA infection nor to guide or monitor treatment for MRSA infections. Performed at [facility], [place] [address] [place], [place] [address]       Radiology Studies: Dg Chest Port 1 View  Result Date: 04/18/2018 CLINICAL DATA:  Increased shortness of breath this morning. EXAM: PORTABLE CHEST 1 VIEW COMPARISON:  Chest x-ray dated April 16, 2018. FINDINGS: Interval placement of a right upper extremity PICC line with the tip in the proximal right atrium. Interval removal of the left internal jugular central venous catheter. Unchanged enteric tube entering the stomach. Stable cardiomediastinal silhouette. Shallow inspiration with unchanged severe elevation of the right hemidiaphragm and right basilar atelectasis. Slightly increased left basilar atelectasis. No pleural effusion or pneumothorax. No acute osseous abnormality. Contrast within the hepatic flexure. Pneumoperitoneum has resolved. IMPRESSION: 1. Slightly worsened left basilar atelectasis. 2. Unchanged severe elevation of the right hemidiaphragm and right basilar atelectasis. Electronically Signed   By: Titus Dubin M.D.   On: 04/18/2018 08:13   Scheduled Meds: . acetaminophen  650 mg Oral Q8H  . chlorhexidine  15 mL Mouth Rinse BID  . Chlorhexidine Gluconate Cloth  6 each Topical Daily  . enoxaparin (LOVENOX) injection  40 mg Subcutaneous Q24H  . mouth rinse  15 mL Mouth Rinse q12n4p   Continuous Infusions: . dextrose 5 % and 0.45 % NaCl with KCl 20 mEq/L 50 mL/hr at 04/19/18 1426  . famotidine (PEPCID) IV Stopped (04/19/18 1105)    LOS: 11 days   Giuliano Preece J Aarthi Uyeno,MD [facility]  If 7PM-7AM, please contact night-coverage [email or web address] Password [facility] 04/19/2018, 6:26 PM

## 2018-04-20 DIAGNOSIS — I10 Essential (primary) hypertension: Secondary | ICD-10-CM

## 2018-04-20 DIAGNOSIS — K56609 Unspecified intestinal obstruction, unspecified as to partial versus complete obstruction: Secondary | ICD-10-CM

## 2018-04-20 DIAGNOSIS — D696 Thrombocytopenia, unspecified: Secondary | ICD-10-CM

## 2018-04-20 DIAGNOSIS — D472 Monoclonal gammopathy: Secondary | ICD-10-CM

## 2018-04-20 DIAGNOSIS — C8304 Small cell B-cell lymphoma, lymph nodes of axilla and upper limb: Secondary | ICD-10-CM

## 2018-04-20 LAB — CBC WITH DIFFERENTIAL/PLATELET
Basophils Absolute: 0 10*3/uL (ref 0.0–0.1)
Basophils Relative: 0 %
Eosinophils Absolute: 0.1 10*3/uL (ref 0.0–0.7)
Eosinophils Relative: 3 %
HEMATOCRIT: 29.6 % — AB (ref 36.0–46.0)
HEMOGLOBIN: 10.1 g/dL — AB (ref 12.0–15.0)
LYMPHS ABS: 0.8 10*3/uL (ref 0.7–4.0)
LYMPHS PCT: 23 %
MCH: 29.3 pg (ref 26.0–34.0)
MCHC: 34.1 g/dL (ref 30.0–36.0)
MCV: 85.8 fL (ref 78.0–100.0)
MONOS PCT: 9 %
Monocytes Absolute: 0.3 10*3/uL (ref 0.1–1.0)
NEUTROS ABS: 2.3 10*3/uL (ref 1.7–7.7)
NEUTROS PCT: 65 %
Platelets: 166 10*3/uL (ref 150–400)
RBC: 3.45 MIL/uL — AB (ref 3.87–5.11)
RDW: 13.6 % (ref 11.5–15.5)
WBC: 3.5 10*3/uL — AB (ref 4.0–10.5)

## 2018-04-20 LAB — GLUCOSE, CAPILLARY
GLUCOSE-CAPILLARY: 93 mg/dL (ref 65–99)
GLUCOSE-CAPILLARY: 101 mg/dL — AB (ref 65–99)
Glucose-Capillary: 96 mg/dL (ref 65–99)

## 2018-04-20 LAB — BASIC METABOLIC PANEL
ANION GAP: 5 (ref 5–15)
BUN: 13 mg/dL (ref 6–20)
CHLORIDE: 112 mmol/L — AB (ref 101–111)
CO2: 23 mmol/L (ref 22–32)
CREATININE: 0.86 mg/dL (ref 0.44–1.00)
Calcium: 9 mg/dL (ref 8.9–10.3)
GFR calc non Af Amer: 59 mL/min — ABNORMAL LOW (ref >60)
Glucose, Bld: 99 mg/dL (ref 65–99)
POTASSIUM: 3.8 mmol/L (ref 3.5–5.1)
SODIUM: 140 mmol/L (ref 135–145)

## 2018-04-20 MED ORDER — BISACODYL 10 MG RE SUPP
10.0000 mg | Freq: Once | RECTAL | Status: AC
  Administered 2018-04-20: 10 mg via RECTAL
  Filled 2018-04-20: qty 1

## 2018-04-20 MED ORDER — FAMOTIDINE 20 MG PO TABS
20.0000 mg | ORAL_TABLET | Freq: Two times a day (BID) | ORAL | Status: DC
  Administered 2018-04-20 – 2018-04-21 (×4): 20 mg via ORAL
  Filled 2018-04-20 (×5): qty 1

## 2018-04-20 MED ORDER — PSYLLIUM 95 % PO PACK
1.0000 | PACK | Freq: Every day | ORAL | Status: DC
  Administered 2018-04-20 – 2018-04-21 (×2): 1 via ORAL
  Filled 2018-04-20 (×4): qty 1

## 2018-04-20 MED ORDER — ACETAMINOPHEN 325 MG PO TABS
650.0000 mg | ORAL_TABLET | ORAL | Status: DC | PRN
Start: 2018-04-20 — End: 2018-04-22

## 2018-04-20 MED ORDER — DEXTROSE IN LACTATED RINGERS 5 % IV SOLN
INTRAVENOUS | Status: DC
  Administered 2018-04-20: 09:00:00 via INTRAVENOUS
  Administered 2018-04-21: 1000 mL via INTRAVENOUS

## 2018-04-20 MED ORDER — ADULT MULTIVITAMIN W/MINERALS CH
1.0000 | ORAL_TABLET | Freq: Every day | ORAL | Status: DC
  Administered 2018-04-20 – 2018-04-21 (×2): 1 via ORAL
  Filled 2018-04-20 (×3): qty 1

## 2018-04-20 NOTE — Progress Notes (Signed)
Nutrition Follow-up  DOCUMENTATION CODES:   Not applicable  INTERVENTION:  - Continue Boost Breeze TID. - Will order daily multivitamin with minerals. - Will order Magic Cup BID with meals, each supplement provides 290 kcal and 9 grams of protein. - Continue to encourage PO intakes; may be beneficial for night shift to offer patient items over night as she tends to be awake during that time. - Diet advancement as medically feasible.   NUTRITION DIAGNOSIS:   Inadequate oral intake related to vomiting, nausea as evidenced by meal completion < 50%. -ongoing  GOAL:   Patient will meet greater than or equal to 90% of their needs -unmet  MONITOR:   PO intake, Supplement acceptance, Diet advancement, Weight trends, Labs, Skin, I & O's  ASSESSMENT:   Patient with PMH significant for marginal zone lymphoma, MGUS, DM, HTN, and s/p sigmoid colectomy due to second recurrence of sigmoid volvulus in 2017. Presents this admission with nausea, vomiting, and right upper quadrant pain. CT abdomen pelvis shows multiple dilated small bowel loops in the abdomen and pelvis.   Weight +5 lbs/2.1 kg from 6/7-6/15. No new weight since 6/15. Last RD assessment on 6/14. Patient is now POD #4 LOA. NGT removed and diet advanced from NPO to CLD on 6/18 at 8:10 AM and from CLD to FLD that same date at 2:00 PM. Per chart review, pt consumed 10% of breakfast and 15% of lunch yesterday.  Patient working with OT at this time and no family/visitors noted to be in the room. Talked with RN who reports all of the following information. Patient consumed 3 bites of grits this AM. She has been refusing/declining foods and beverages. She denies abdominal pain or pressure, nausea, or vomiting. A visitor earlier this AM reported that it is patient's usual to stay up all night and to go to bed/sleep in the early morning (noted that today she went to sleep at 5:00 AM). Her daughter reported that at baseline, even PTA, patient ate  very little.   Medications reviewed; 20 mg Pepcid BID, 1 packet Metamucil/day. Labs reviewed; CBG: 93 mg/dL today, Cl: 112 mmol/L.  IVF: D5-LR @ 50 mL/hr (204 kcal).       Diet Order:   Diet Order           Diet full liquid Room service appropriate? Yes; Fluid consistency: Thin  Diet effective 1400          EDUCATION NEEDS:   No education needs have been identified at this time  Skin:  Skin Assessment: Skin Integrity Issues: Skin Integrity Issues:: Incisions Incisions: abdominal (6/15)  Last BM:  6/14  Height:   Ht Readings from Last 1 Encounters:  04/16/18 5\' 5"  (1.651 m)    Weight:   Wt Readings from Last 1 Encounters:  04/16/18 163 lb 5.8 oz (74.1 kg)    Ideal Body Weight:  54.5 kg  BMI:  Body mass index is 27.18 kg/m.  Estimated Nutritional Needs:   Kcal:  1450-1650 kcal  Protein:  70-80 g  Fluid:  >1.4 L/day     Jarome Matin, MS, RD, LDN, [facility] Inpatient Clinical Dietitian Pager # [phone] After hours/weekend pager # [phone]

## 2018-04-20 NOTE — Progress Notes (Addendum)
PROGRESS NOTE    Erika Day  TDD:[id number] DOB: 12-05-1929 DOA: 04/08/2018 PCP: Mack Hook, MD    Brief Narrative:  84 year old female who presented with right upper quadrant abdominal pain. She does have the significant past medical history of marginal zone lymphoma, MGUS, history of sigmoid volvulus status post colectomy 2017. Acute onset of abdominal pain, associated with constipation, no nausea or vomiting. On initial physical examination her blood pressure was 180/90, heart rate 75, respirations 17, temperature 97.6, oxygen saturation 98%. Moist mucous membranes, lungs were clear to auscultation bilaterally, heart S1-S2 present rhythmic, abdomen was tender diffusely, no masses were palpated, bowel sounds were positive, lower extremity edema. CT of the abdomen had multiple dilated small bowel loops in the abdomen and pelvis, suggesting small bowel obstruction.   Patient was admitted to the medical unit with the working diagnosis of small bowel obstruction. Patient failed conservative therapy and underwent exploratory laparotomy with lysis of adhesions June 15.  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 Small bowel obstruction. Sp laparotomy, tolerating po well, no nausea or vomiting, ng tube has been removed, out of bed as tolerated, continue pain control, follow with surgical recommendations. Gentle hydration with balanced electrolyte solutions.   2. HTN. Blood pressure stable continue medical therapy with lisinopril.   3. T2DM. Continue glucose cover and monitoring with insulin sliding scale, tolerating po well,  capillary glucose 114, 109, 93, 101.   4. Lymphoma. Stable, continue to follow on cell count.    DVT prophylaxis: scd  Code Status:  full Family Communication:  No family at the bedside  Disposition Plan:  Transfer to medical unit, follow with pt recommendations.    Consultants:   Surgery   Procedures:     Antimicrobials:       Subjective: Patient with no evident pain, has been tolerating po well, no nausea or vomiting. Last night not able to sleep until 5 am, this am somnolent but able to respond to questions.   Objective: Vitals:   04/20/18 0000 04/20/18 0007 04/20/18 0105 04/20/18 0400  BP: (!) 178/95 (!) 178/95 (!) 131/57 (!) 153/58  Pulse: 86  87 84  Resp: (!) 21  (!) 23 19  Temp: 98.3 F (36.8 C)   97.8 F (36.6 C)  TempSrc: Axillary   Axillary  SpO2: 100%  100% 100%  Weight:      Height:        Intake/Output Summary (Last 24 hours) at 04/20/2018 0801 Last data filed at 04/20/2018 0600 Gross per 24 hour  Intake 2727.92 ml  Output 3090 ml  Net -362.08 ml   Filed Weights   04/08/18 1249 04/08/18 2000 04/16/18 1417  Weight: 63 kg (139 lb) 72 kg (158 lb 11.7 oz) 74.1 kg (163 lb 5.8 oz)    Examination:   General: deconditioned, and somnolent.  Neurology: Awake and alert, non focal  E ENT: mild pallor, no icterus, oral mucosa moist Cardiovascular: No JVD. S1-S2 present, rhythmic, no gallops, rubs, or murmurs. No lower extremity edema. Pulmonary: decreased breath sounds bilaterally at bases, no wheezing, rhonchi or rales. Gastrointestinal. Abdomen with mid surgical wound in place,  with no organomegaly, non tender, no rebound or guarding Skin. No rashes Musculoskeletal: no joint deformities     Data Reviewed: I have personally reviewed following labs and imaging studies  CBC: Recent Labs  Lab 04/16/18 0423 04/17/18 0617 04/18/18 0313 04/19/18 0318 04/20/18 0517  WBC 3.4* 8.1 5.5 4.4 3.5*  NEUTROABS 2.3 6.5 4.5 3.3 2.3  HGB 12.7 9.4* 9.1*  9.0* 10.1*  HCT 38.0 29.4* 27.6* 27.1* 29.6*  MCV 86.8 88.6 89.0 87.7 85.8  PLT PLATELET CLUMPS NOTED ON SMEAR, COUNT APPEARS DECREASED 159 124* 143* 950   Basic Metabolic Panel: Recent Labs  Lab 04/15/18 0450  04/18/18 0313 04/18/18 0800 04/19/18 0318 04/19/18 0645 04/20/18 0517  NA 133*   < > 132* 137 134* 138 140  K 4.0   < > 5.4* 3.9 5.3* 4.1 3.8  CL 102   < > 111 112* 112* 112* 112*  CO2 25   < > 20* 21* 21* 21* 23  GLUCOSE 120*   < > 409* 78 382* 110* 99  BUN 22*   < > 18 17 14 14 13   CREATININE 1.11*   < > 0.92 0.87 0.84 0.85 0.86  CALCIUM 9.2   < > 7.7* 8.5* 8.1* 8.7* 9.0  MG 1.8  --   --   --   --   --   --    < > = values in this interval not displayed.   GFR: Estimated Creatinine Clearance: 47.3 mL/min (by C-G formula based on SCr of 0.86 mg/dL). Liver Function Tests: No results for input(s): AST, ALT, ALKPHOS, BILITOT, PROT, ALBUMIN in the last 168 hours. No results for input(s): LIPASE, AMYLASE in the last 168 hours. No results for input(s): AMMONIA in the last 168 hours. Coagulation Profile: No results for input(s): INR, PROTIME in the last 168 hours. Cardiac Enzymes: No results for input(s): CKTOTAL, CKMB, CKMBINDEX, TROPONINI in the last 168 hours. BNP (last 3 results) No results for input(s): PROBNP in the last 8760 hours. HbA1C: No results for input(s): HGBA1C in the last 72 hours. CBG: Recent Labs  Lab 04/19/18 0723 04/19/18 1157 04/19/18 1803 04/19/18 2352 04/20/18 0540  GLUCAP 109* 112* 114* 109* 93   Lipid Profile: No results for input(s): CHOL, HDL, LDLCALC, TRIG, CHOLHDL, LDLDIRECT in the last 72 hours. Thyroid Function Tests: No results for input(s): TSH, T4TOTAL, FREET4, T3FREE, THYROIDAB in the last 72 hours. Anemia Panel: No results for input(s): VITAMINB12, FOLATE, FERRITIN, TIBC, IRON, RETICCTPCT in the last 72 hours.    Radiology Studies: I have reviewed all of the imaging during this hospital visit  personally     Scheduled Meds: . acetaminophen  650 mg Oral Q8H  . chlorhexidine  15 mL Mouth Rinse BID  . Chlorhexidine Gluconate Cloth  6 each Topical Daily  . enoxaparin (LOVENOX) injection  40 mg Subcutaneous Q24H  . lisinopril  10 mg Oral Daily  . mouth rinse  15 mL Mouth Rinse q12n4p   Continuous Infusions: . dextrose 5 % and 0.45 % NaCl with KCl 20 mEq/L 50 mL/hr at 04/20/18 0000  . famotidine (PEPCID) IV Stopped (04/19/18 2221)     LOS: 12 days        Adela Esteban Gerome Apley, MD [facility] Pager [phone]

## 2018-04-20 NOTE — Evaluation (Signed)
Occupational Therapy Evaluation Patient Details Name: Erika Day MRN: [id number] DOB: Jun 06, 1930 Today's Date: 04/20/2018    History of Present Illness 84 yo female admitted with SBO. hx of L femur fx s/p IM nail 2018, DDD, DM, CVA, CHF, lymphoma, colectomy. s/p exploratory lap with lysis of adhesions   Clinical Impression   Pt was admitted for the above.  She needs extensive assistance of 2 people for mobility and LB adls. Pt was able to SPT with assistance prior to admission and had assistance with adls.  PT had evaluated pt before sx and she needed only min A for transfers. Will follow in acute setting focusing on toilet transfers and mobility to assist caregiver who provides ADL assistance    Follow Up Recommendations  SNF    Equipment Recommendations  None recommended by OT    Recommendations for Other Services       Precautions / Restrictions Precautions Precautions: Fall Restrictions Weight Bearing Restrictions: No      Mobility Bed Mobility     Rolling: Max assist;+2 for physical assistance Sidelying to sit: Max assist;+2 for physical assistance   Sit to supine: Max assist;+2 for physical assistance   General bed mobility comments: assist for legs and trunk  Transfers   Equipment used: Rolling walker (2 wheeled)   Sit to Stand: Max assist;+2 physical assistance Stand pivot transfers: Max assist;+2 physical assistance       General transfer comment: assist to rise and stabilize. Pt with posterior lean    Balance                                           ADL either performed or assessed with clinical judgement   ADL Overall ADL's : Needs assistance/impaired                         Toilet Transfer: Maximal assistance;+2 for physical assistance;BSC;RW   Toileting- Clothing Manipulation and Hygiene: Total assistance;+2 for physical assistance;Sit to/from stand(had 3rd person help)         General ADL Comments: pt had  diarrhea.  Assisted with cleanup, to [facility] and back to bed at her request.  Pt has generalized weakness and decreased endurance.  Based on clinical judgment, UB adls mod A, LB total +2 assistance.  Per chart, pt has assistance for bathing and dressing at baseline     Vision         Perception     Praxis      Pertinent Vitals/Pain Pain Assessment: Faces Faces Pain Scale: Hurts even more Pain Location: abdomen Pain Intervention(s): Limited activity within patient's tolerance;Monitored during session;Repositioned     Hand Dominance     Extremity/Trunk Assessment Upper Extremity Assessment Upper Extremity Assessment: Generalized weakness           Communication Communication Communication: (difficult to understand)   Cognition Arousal/Alertness: (initially sleepy/lethargic; more awake when up) Behavior During Therapy: WFL for tasks assessed/performed Overall Cognitive Status: No family/caregiver present to determine baseline cognitive functioning                                 General Comments: had some difficulty with cues:  leaned opposite of command   General Comments       Exercises     Shoulder Instructions  Home Living Family/patient expects to be discharged to:: Unsure       Home Access: Ramped entrance     Home Layout: One level               Home Equipment: Bedside commode;Wheelchair - [employer] - 2 wheels;Shower seat   Additional Comments: from home with family and aide assistance.       Prior Functioning/Environment Level of Independence: Needs assistance  Gait / Transfers Assistance Needed: transfers bed <>wc with assistance.  ADL's / Homemaking Assistance Needed: aide helps with bathing, dressing, meals.             OT Problem List: Decreased strength;Decreased activity tolerance;Impaired balance (sitting and/or standing);Decreased cognition;Decreased safety awareness;Decreased knowledge of use of DME or  AE;Pain      OT Treatment/Interventions: Self-care/ADL training;Energy conservation;DME and/or AE instruction;Patient/family education;Balance training;Therapeutic activities;Cognitive remediation/compensation    OT Goals(Current goals can be found in the care plan section) Acute Rehab OT Goals Patient Stated Goal: none stated OT Goal Formulation: With patient Time For Goal Achievement: 05/04/18 Potential to Achieve Goals: Fair ADL Goals Pt Will Perform Grooming: with set-up;sitting Pt Will Transfer to Toilet: with mod assist;bedside commode;stand pivot transfer Additional ADL Goal #1: pt will go from sit to stand with mod A and maintain with min A for adls x 2 minutes Additional ADL Goal #2: pt will perform bed mobility with mod A in preparation for adls  OT Frequency: Min 2X/week   Barriers to D/C:            Co-evaluation PT/OT/SLP Co-Evaluation/Treatment: Yes Reason for Co-Treatment: For patient/therapist safety PT goals addressed during session: Mobility/safety with mobility OT goals addressed during session: ADL's and self-care      AM-PAC PT "6 Clicks" Daily Activity     Outcome Measure Help from another person eating meals?: A Little Help from another person taking care of personal grooming?: A Little Help from another person toileting, which includes using toliet, bedpan, or urinal?: A Lot Help from another person bathing (including washing, rinsing, drying)?: A Lot Help from another person to put on and taking off regular upper body clothing?: A Lot Help from another person to put on and taking off regular lower body clothing?: Total 6 Click Score: 13   End of Session    Activity Tolerance: Patient limited by fatigue;Patient limited by pain Patient left: in bed;with call bell/phone within reach;with nursing/sitter in room  OT Visit Diagnosis: Unsteadiness on feet (R26.81);Muscle weakness (generalized) (M62.81)                Time: 0045-9977 OT Time Calculation  (min): 35 min Charges:  OT General Charges $OT Visit: 1 Visit OT Evaluation $OT Eval Moderate Complexity: 1 Mod G-Codes:     [place], OTR/L [phone] 04/20/2018  Emberlynn Riggan 04/20/2018, 12:28 PM

## 2018-04-20 NOTE — Evaluation (Signed)
Physical Therapy Re-Evaluation Patient Details Name: Erika Day MRN: [id number] DOB: [birth year] Today's Date: 04/20/2018   History of Present Illness  84 yo female admitted with SBO. hx of L femur fx s/p IM nail 2018, DDD, DM, CVA, CHF, lymphoma, colectomy. s/p exploratory lap with lysis of adhesions  Clinical Impression  Pt was admitted for the above.  She needs extensive assistance of 2 people for mobility and LB adls. Pt was able to SPT with assistance prior to admission and had assistance with adls.  PT had evaluated pt before sx and she needed only min A for transfers.  Pt would benefit from follow up rehab at SNF level to maximize IND and safety prior to return home.    Follow Up Recommendations SNF    Equipment Recommendations  None recommended by PT    Recommendations for Other Services       Precautions / Restrictions Precautions Precautions: Fall Restrictions Weight Bearing Restrictions: No      Mobility  Bed Mobility Overal bed mobility: Needs Assistance Bed Mobility: Supine to Sit;Sit to Supine Rolling: Max assist;+2 for physical assistance Sidelying to sit: Max assist;+2 for physical assistance Supine to sit: Max assist;+2 for physical assistance Sit to supine: Max assist;+2 for physical assistance   General bed mobility comments: assist for legs and trunk  Transfers Overall transfer level: Needs assistance Equipment used: Rolling walker (2 wheeled) Transfers: Sit to/from [facility] Sit to Stand: Max assist;+2 physical assistance Stand pivot transfers: Max assist;+2 physical assistance       General transfer comment: assist to rise and stabilize. Pt with posterior lean  Ambulation/Gait             General Gait Details: stand pvt bed<>BSC only  Stairs            Wheelchair Mobility    Modified Rankin (Stroke Patients Only)       Balance Overall balance assessment: Needs assistance Sitting-balance support:  Bilateral upper extremity supported;Feet supported Sitting balance-Leahy Scale: Poor     Standing balance support: Bilateral upper extremity supported Standing balance-Leahy Scale: Poor                               Pertinent Vitals/Pain Pain Assessment: Faces Faces Pain Scale: Hurts even more Pain Location: abdomen Pain Descriptors / Indicators: Aching Pain Intervention(s): Limited activity within patient's tolerance;Monitored during session;Repositioned    Home Living Family/patient expects to be discharged to:: Unsure Living Arrangements: Spouse/significant other;Children Available Help at Discharge: Family Type of Home: House Home Access: Ramped entrance     Home Layout: One level Home Equipment: Bedside commode;Wheelchair - [employer] - 2 wheels;Shower seat Additional Comments: from home with family and aide assistance.     Prior Function Level of Independence: Needs assistance   Gait / Transfers Assistance Needed: transfers bed <>wc with assistance.   ADL's / Homemaking Assistance Needed: aide helps with bathing, dressing, meals.         Hand Dominance        Extremity/Trunk Assessment   Upper Extremity Assessment Upper Extremity Assessment: Generalized weakness    Lower Extremity Assessment Lower Extremity Assessment: Generalized weakness    Cervical / Trunk Assessment Cervical / Trunk Assessment: Kyphotic  Communication   Communication: Other (comment)(difficult to understand)  Cognition Arousal/Alertness: Lethargic Behavior During Therapy: WFL for tasks assessed/performed Overall Cognitive Status: No family/caregiver present to determine baseline cognitive functioning  General Comments: had some difficulty with cues:  leaned opposite of command      General Comments      Exercises     Assessment/Plan    PT Assessment Patient needs continued PT services  PT Problem List  Decreased strength;Decreased balance;Decreased mobility;Decreased activity tolerance;Decreased knowledge of use of DME;Pain       PT Treatment Interventions DME instruction;Gait training;Functional mobility training;Therapeutic activities;Balance training;Patient/family education;Therapeutic exercise    PT Goals (Current goals can be found in the Care Plan section)  Acute Rehab PT Goals Patient Stated Goal: none stated PT Goal Formulation: With patient Time For Goal Achievement: 04/29/18 Potential to Achieve Goals: Good    Frequency Min 3X/week   Barriers to discharge        Co-evaluation PT/OT/SLP Co-Evaluation/Treatment: Yes Reason for Co-Treatment: For patient/therapist safety PT goals addressed during session: Mobility/safety with mobility OT goals addressed during session: ADL's and self-care       AM-PAC PT "6 Clicks" Daily Activity  Outcome Measure Difficulty turning over in bed (including adjusting bedclothes, sheets and blankets)?: Unable Difficulty moving from lying on back to sitting on the side of the bed? : Unable Difficulty sitting down on and standing up from a chair with arms (e.g., wheelchair, bedside commode, etc,.)?: Unable Help needed moving to and from a bed to chair (including a wheelchair)?: A Lot Help needed walking in hospital room?: A Lot Help needed climbing 3-5 steps with a railing? : Total 6 Click Score: 8    End of Session Equipment Utilized During Treatment: Gait belt Activity Tolerance: Patient limited by fatigue Patient left: in bed;with call bell/phone within reach;with nursing/sitter in room Nurse Communication: Mobility status PT Visit Diagnosis: Muscle weakness (generalized) (M62.81);Difficulty in walking, not elsewhere classified (R26.2);Pain    Time: 1140-1215 PT Time Calculation (min) (ACUTE ONLY): 35 min   Charges:   PT Evaluation $PT Eval Moderate Complexity: 1 Mod     PT G Codes:        Pg [phone]  [phone]   Kasi Lasky 04/20/2018, 1:31 PM

## 2018-04-20 NOTE — Progress Notes (Signed)
4 Days Post-Op    CC:  SBO  Subjective: Pt won't open her eyes but we can wake her up.  They report she was up all last PM, didn't go to sleep till 5 AM.  Has not been OOB since surgery.  Some flatus, no BM.  Refusing scheduled surgery.  Her speech is somewhat slurred but her teeth are not here in the hospital.  She is reported to be wheelchair bound at home.  Foley is still in.   Objective: Vital signs in last 24 hours: Temp:  [97 F (36.1 C)-98.3 F (36.8 C)] 97.8 F (36.6 C) (06/19 0400) Pulse Rate:  [68-98] 84 (06/19 0400) Resp:  [18-27] 19 (06/19 0400) BP: (131-198)/(44-100) 153/58 (06/19 0400) SpO2:  [100 %] 100 % (06/19 0400) Last BM Date: 04/13/18 170 PO 1500 IV Urine 3090 Afebrile, VSS Labs OK No radiology Intake/Output from previous day: 06/18 0701 - 06/19 0700 In: 2727.9 [P.O.:170; I.V.:2507.9; IV Piggyback:50] Out: 3090 [VHQIO:9629] Intake/Output this shift: No intake/output data recorded.  General appearance: alert, cooperative, no distress and won't open her eyes.   Chest:  Clear anterior Abd:  Dressing removed, incision looks fine, few BS, ? Flatus, no BM   Lab Results:  Recent Labs    04/19/18 0318 04/20/18 0517  WBC 4.4 3.5*  HGB 9.0* 10.1*  HCT 27.1* 29.6*  PLT 143* 166    BMET Recent Labs    04/19/18 0645 04/20/18 0517  NA 138 140  K 4.1 3.8  CL 112* 112*  CO2 21* 23  GLUCOSE 110* 99  BUN 14 13  CREATININE 0.85 0.86  CALCIUM 8.7* 9.0   PT/INR No results for input(s): LABPROT, INR in the last 72 hours.  No results for input(s): AST, ALT, ALKPHOS, BILITOT, PROT, ALBUMIN in the last 168 hours.   Lipase     Component Value Date/Time   LIPASE 47 04/08/2018 1430     Medications: . acetaminophen  650 mg Oral Q8H  . chlorhexidine  15 mL Mouth Rinse BID  . Chlorhexidine Gluconate Cloth  6 each Topical Daily  . enoxaparin (LOVENOX) injection  40 mg Subcutaneous Q24H  . lisinopril  10 mg Oral Daily  . mouth rinse  15 mL Mouth  Rinse q12n4p   . dextrose 5 % and 0.45 % NaCl with KCl 20 mEq/L 50 mL/hr at 04/20/18 0000  . famotidine (PEPCID) IV Stopped (04/19/18 2221)   Antibiotics Given (last 72 hours)    None       Assessment/Plan Small cell lymphoma - axilla/CLL Hx of tobacco use Hx of hypertension/hypotension Hx of CVA Diabetes DJD/osteoarthritis  SBO 04/08/18 - Hx of Sigmoid colectomy 06/2016/Ventral hernia repair/LOA 04/2014 Lysis of adhesions 04/16/18, Dr. Armandina Gemma  - [place] d/ced 6/18 - clears -  OOB to chair, PT/OT ordered not done yet -  IS   FEN:  IV fluids/full liquids ID:  None currently DVT:  Lovenox Foley: In Follow up:  Dr. Harlow Asa  Plan: I think the biggest thing is to get her OOB and moving.  She needs to be kept awake during the day and sleep at night.  I have ordered OOB and up in the chair.  I see no need for a foley and I will just remove it.  She can go to PO meds, and see how she does.  I will give her a dulcolax today.  She can go to the floor from our standpoint. No IV pain meds. She has Tylenol and Tramadol  for pain,    LOS: 12 days    Delorice Bannister 04/20/2018 [phone]

## 2018-04-21 DIAGNOSIS — K562 Volvulus: Secondary | ICD-10-CM

## 2018-04-21 DIAGNOSIS — R112 Nausea with vomiting, unspecified: Secondary | ICD-10-CM

## 2018-04-21 LAB — CBC WITH DIFFERENTIAL/PLATELET
BASOS ABS: 0 10*3/uL (ref 0.0–0.1)
Basophils Relative: 0 %
Eosinophils Absolute: 0.1 10*3/uL (ref 0.0–0.7)
Eosinophils Relative: 3 %
HCT: 29.7 % — ABNORMAL LOW (ref 36.0–46.0)
Hemoglobin: 9.9 g/dL — ABNORMAL LOW (ref 12.0–15.0)
LYMPHS ABS: 1 10*3/uL (ref 0.7–4.0)
LYMPHS PCT: 32 %
MCH: 29.6 pg (ref 26.0–34.0)
MCHC: 33.3 g/dL (ref 30.0–36.0)
MCV: 88.7 fL (ref 78.0–100.0)
MONO ABS: 0.3 10*3/uL (ref 0.1–1.0)
Monocytes Relative: 11 %
NEUTROS ABS: 1.6 10*3/uL — AB (ref 1.7–7.7)
Neutrophils Relative %: 54 %
Platelets: 179 10*3/uL (ref 150–400)
RBC: 3.35 MIL/uL — AB (ref 3.87–5.11)
RDW: 13.9 % (ref 11.5–15.5)
WBC: 3 10*3/uL — AB (ref 4.0–10.5)

## 2018-04-21 LAB — GLUCOSE, CAPILLARY
GLUCOSE-CAPILLARY: 80 mg/dL (ref 65–99)
Glucose-Capillary: 78 mg/dL (ref 65–99)
Glucose-Capillary: 81 mg/dL (ref 65–99)

## 2018-04-21 LAB — BASIC METABOLIC PANEL
ANION GAP: 4 — AB (ref 5–15)
BUN: 12 mg/dL (ref 6–20)
CHLORIDE: 113 mmol/L — AB (ref 101–111)
CO2: 23 mmol/L (ref 22–32)
Calcium: 8.8 mg/dL — ABNORMAL LOW (ref 8.9–10.3)
Creatinine, Ser: 0.75 mg/dL (ref 0.44–1.00)
GFR calc Af Amer: >60 mL/min (ref >60)
GLUCOSE: 85 mg/dL (ref 65–99)
POTASSIUM: 3.7 mmol/L (ref 3.5–5.1)
Sodium: 140 mmol/L (ref 135–145)

## 2018-04-21 NOTE — Clinical Social Work Placement (Signed)
[place] authorization initiated at [employer], waiting for authorization.   CLINICAL SOCIAL WORK PLACEMENT  NOTE  Date:  04/21/2018  Patient Details  Name: Erika Day MRN: [id number] Date of Birth: 1930-06-28  Clinical Social Work is seeking post-discharge placement for this patient at the [place] level of care (*CSW will initial, date and re-position this form in  chart as items are completed):  Yes   Patient/family provided with [place] Work Department's list of facilities offering this level of care within the geographic area requested by the patient (or if unable, by the patient's family).  Yes   Patient/family informed of their freedom to choose among providers that offer the needed level of care, that participate in Medicare, Medicaid or managed care program needed by the patient, have an available bed and are willing to accept the patient.  Yes   Patient/family informed of [facility]'s ownership interest in [facility] and [facility], as well as of the fact that they are under no obligation to receive care at these facilities.  PASRR submitted to EDS on       PASRR number received on       Existing PASRR number confirmed on 04/21/18     FL2 transmitted to all facilities in geographic area requested by pt/family on       FL2 transmitted to all facilities within larger geographic area on       Patient informed that his/her managed care company has contracts with or will negotiate with certain facilities, including the following:  [facility]     Yes   Patient/family informed of bed offers received.  Patient chooses bed at [facility]     Physician recommends and patient chooses bed at      Patient to be transferred to [facility] on  .  Patient to be transferred to facility by [facility]     Patient family notified on   of transfer.  Name of family member notified:  Daughter-[name]      PHYSICIAN Please prepare priority discharge summary, including medications     Additional Comment:    _______________________________________________ Lia Hopping, LCSW 04/21/2018, 4:42 PM

## 2018-04-21 NOTE — NC FL2 (Signed)
[place] LEVEL OF CARE SCREENING TOOL     IDENTIFICATION  Patient Name: Erika Day Birthdate: 03/19/1930 Sex: female Admission Date (Current Location): 04/08/2018  [facility] and [place] Number:  Herbalist and Address:  [facility],  [name], [place]      Provider Number: [id number]  Attending Physician Name and Address:  Tawni Millers  Relative Name and Phone Number:       Current Level of Care: Hospital Recommended Level of Care: [place] Prior Approval Number:    Date Approved/Denied:   PASRR Number: [id number] A)  Discharge Plan: SNF    Current Diagnoses: Patient Active Problem List   Diagnosis Date Noted  . Small bowel obstruction ([place]) 04/08/2018  . MGUS (monoclonal gammopathy of unknown significance) 02/11/2018  . History of diabetes mellitus 04/19/2017  . Hypercalcemia 02/12/2017  . Chronic leukopenia 02/12/2017  . Postoperative anemia due to acute blood loss 11/26/2016  . Fall 11/23/2016  . Closed fracture of intertrochanteric section of femur ([place]) 11/23/2016  . Other pancytopenia ([place]) 11/13/2016  . Sinusitis 10/14/2016  . Bleeding external hemorrhoids 08/20/2016  . Recurrent ventral incisional hernia s/p primary repair 06/30/2016 07/01/2016  . Pressure ulcer 05/20/2016  . Lower extremity edema 05/20/2016  . Protein-calorie malnutrition, severe ([place]) 05/20/2016  . Volvulus of sigmoid colon ([place])   . Sigmoid volvulus s/p lap sigmoid colectomy 06/30/2016 05/19/2016  . 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 Other fatigue 05/15/2016  . CAP (community acquired pneumonia) 02/13/2016  . Anemia, unspecified 02/13/2016  . Thrombocytopenia ([place]) 02/13/2016  . Hyponatremia 02/13/2016  . Kyphosis 02/01/2016  . Pressure ulcer of coccygeal region 01/31/2016  . Mobility impaired 01/31/2016  .  Osteoarthritis 01/31/2016  . Abdominal cramping 04/23/2014  . Essential hypertension 09/06/2011  . Ovarian torsion 08/25/2011    Orientation RESPIRATION BLADDER Height & Weight     Time  Normal Continent Weight: 163 lb 5.8 oz (74.1 kg) Height:  5\' 5"  (165.1 cm)  BEHAVIORAL SYMPTOMS/MOOD NEUROLOGICAL BOWEL NUTRITION STATUS      Continent Diet  AMBULATORY STATUS COMMUNICATION OF NEEDS Skin   Extensive Assist   Surgical wounds(Abdomen )                       Personal Care Assistance Level of Assistance  Bathing, Feeding, Dressing Bathing Assistance: Maximum assistance Feeding assistance: Independent Dressing Assistance: Maximum assistance     Functional Limitations Info  Sight, Hearing, Speech Sight Info: Adequate Hearing Info: Adequate Speech Info: Adequate    SPECIAL CARE FACTORS FREQUENCY  PT (By licensed PT), OT (By licensed OT)     PT Frequency: 5x/week  OT Frequency: 5x/week            Contractures Contractures Info: Not present    Additional Factors Info  Code Status, Allergies, Psychotropic Code Status Info: Fullcode Allergies Info: Allergies: Morphine And Related           Current Medications (04/21/2018):  This is the current hospital active medication list Current Facility-Administered Medications  Medication Dose Route Frequency Provider Last Rate Last Dose  . acetaminophen (TYLENOL) tablet 650 mg  650 mg Oral Q4H PRN Earnstine Regal, PA-C      . chlorhexidine (PERIDEX) 0.12 % solution 15 mL  15 mL Mouth Rinse BID Alma Friendly, MD   15 mL at 04/21/18 1044  . Chlorhexidine  Gluconate Cloth 2 % PADS 6 each  6 each Topical Daily Alma Friendly, MD   6 each at 04/20/18 0905  . enoxaparin (LOVENOX) injection 40 mg  40 mg Subcutaneous Q24H Armandina Gemma, MD   40 mg at 04/21/18 3159  . famotidine (PEPCID) tablet 20 mg  20 mg Oral BID Earnstine Regal, PA-C   20 mg at 04/21/18 1044  . hydrALAZINE (APRESOLINE) injection 10 mg  10 mg  Intravenous Q8H PRN Gardiner [place], NP   10 mg at 04/20/18 0807  . lisinopril (PRINIVIL,ZESTRIL) tablet 10 mg  10 mg Oral Daily Alma Friendly, MD   10 mg at 04/21/18 1044  . MEDLINE mouth rinse  15 mL Mouth Rinse q12n4p Alma Friendly, MD   15 mL at 04/21/18 1045  . multivitamin with minerals tablet 1 tablet  1 tablet Oral Daily Arrien, Jimmy Picket, MD   1 tablet at 04/21/18 1044  . ondansetron (ZOFRAN-ODT) disintegrating tablet 4 mg  4 mg Oral Q6H PRN Armandina Gemma, MD       Or  . ondansetron [facility]) injection 4 mg  4 mg Intravenous Q6H PRN Armandina Gemma, MD      . psyllium (HYDROCIL/METAMUCIL) packet 1 packet  1 packet Oral Daily Earnstine Regal, PA-C   1 packet at 04/20/18 2136  . sodium chloride flush (NS) 0.9 % injection 10-40 mL  10-40 mL Intracatheter PRN Alma Friendly, MD      . traMADol Veatrice [place]bon) tablet 50 mg  50 mg Oral Q6H PRN Jill Alexanders, PA-C         Discharge Medications: Please see discharge summary for a list of discharge medications.  Relevant Imaging Results:  Relevant Lab Results:   Additional Information SSN [id number]  Lia Hopping, LCSW

## 2018-04-21 NOTE — Discharge Instructions (Signed)
[facility]      [facility] [phone]  OPEN ABDOMINAL SURGERY: POST OP INSTRUCTIONS  Always review your discharge instruction sheet given to you by the facility where your surgery was performed.  IF YOU HAVE DISABILITY OR FAMILY LEAVE FORMS, YOU MUST BRING THEM TO THE OFFICE FOR PROCESSING.  PLEASE DO NOT GIVE THEM TO YOUR DOCTOR.  1. A prescription for pain medication may be given to you upon discharge.  Take your pain medication as prescribed, if needed.  If narcotic pain medicine is not needed, then you may take acetaminophen (Tylenol) or ibuprofen (Advil) as needed. 2. Take your usually prescribed medications unless otherwise directed. 3. If you need a refill on your pain medication, please contact your pharmacy. They will contact our office to request authorization.  Prescriptions will not be filled after 5pm or on week-ends. 4. You should follow a light diet the first few days after arrival home, such as soup and crackers, pudding, etc.unless your doctor has advised otherwise. A high-fiber, low fat diet can be resumed as tolerated.   Be sure to include lots of fluids daily. Most patients will experience some swelling and bruising on the chest and neck area.  Ice packs will help.  Swelling and bruising can take several days to resolve 5. Most patients will experience some swelling and bruising in the area of the incision. Ice pack will help. Swelling and bruising can take several days to resolve..  6. It is common to experience some constipation if taking pain medication after surgery.  Increasing fluid intake and taking a stool softener will usually help or prevent this problem from occurring.  A mild laxative (Milk of Magnesia or Miralax) should be taken according to package directions if there are no bowel movements after 48 hours. 7.  You may have steri-strips (small skin tapes) in place directly over the incision.  These strips should be left on the skin for 7-10 days.  If your  surgeon used skin glue on the incision, you may shower in 24 hours.  The glue will flake off over the next 2-3 weeks.  Any sutures or staples will be removed at the office during your follow-up visit. You may find that a light gauze bandage over your incision may keep your staples from being rubbed or pulled. You may shower and replace the bandage daily. 8. ACTIVITIES:  You may resume regular (light) daily activities beginning the next day--such as daily self-care, walking, climbing stairs--gradually increasing activities as tolerated.  You may have sexual intercourse when it is comfortable.  Refrain from any heavy lifting or straining until approved by your doctor. a. You may drive when you no longer are taking prescription pain medication, you can comfortably wear a seatbelt, and you can safely maneuver your car and apply brakes b. Return to Work: ___________________________________ 9. You should see your doctor in the office for a follow-up appointment approximately two weeks after your surgery.  Make sure that you call for this appointment within a day or two after you arrive home to insure a convenient appointment time. OTHER INSTRUCTIONS:  _____________________________________________________________ _____________________________________________________________  WHEN TO CALL YOUR DOCTOR: 1. Fever over 101.0 2. Inability to urinate 3. Nausea and/or vomiting 4. Extreme swelling or bruising 5. Continued bleeding from incision. 6. Increased pain, redness, or drainage from the incision. 7. Difficulty swallowing or breathing 8. Muscle cramping or spasms. 9. Numbness or tingling in hands or feet or around lips.  The clinic staff is available to   answer your questions during regular business hours.  Please don't hesitate to call and ask to speak to one of the nurses if you have concerns.  For further questions, please visit [email or web address]   

## 2018-04-21 NOTE — Progress Notes (Signed)
PROGRESS NOTE    Erika Day  TDV:[id number] DOB: [birth year] DOA: 04/08/2018 PCP: Mack Hook, MD    Brief Narrative:  84 year old female who presented with right upper quadrant abdominal pain. She does have the significant past medical history of marginal zone lymphoma, MGUS, history of sigmoid volvulus status post colectomy 2017. Acute onset of abdominal pain, associated with constipation, no nausea or vomiting. On initial physical examination her blood pressure was 180/90, heart rate 75, respirations 17, temperature 97.6, oxygen saturation 98%. Moist mucous membranes, lungs were clear to auscultation bilaterally, heart S1-S2 present rhythmic, abdomen was tender diffusely, no masses were palpated, bowel sounds were positive, lower extremity edema. Sodium 132, potassium 4.3, chloride 97, bicarb 26, glucose 119, BUN 20, creatinine 1.23, white count 4.2, hemoglobin 12.2, hematocrit 36.4, platelets 161, urine analysis specific gravity 1.027. CT of the abdomen had multiple dilated small bowel loops in the abdomen and pelvis, suggesting small bowel obstruction.    Patient was admitted to the medical unit with the working diagnosis of small bowel obstruction.   Patient failed conservative therapy and underwent exploratory laparotomy with lysis of adhesions June 15.   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 Small bowel obstruction. Sp laparotomy 06/15. Diet has been advanced to soft, no further nausea or vomiting, abdominal pain is stable, improved with analgesics, tramadol. Will discontinue IV fluids, out of bed as tolerated,  continue physical therapy, consult social worker for discharge planning, snf.   2. HTN. Continue blood pressure control with lisinopril, systolic blood pressure 062 mmHg  3. T2DM. Glucose cover and monitoring with insulin sliding scale, tolerating po well, capillary glucose 114, 109, 93, 101, 96, 78. Will discontinue IV dextrose and continue to monitor. Follow nutritional recommendations, multivitamins and magic cup.   4. Lymphoma.  WBC stable at 3,0, hb at 9.9 with hct at 29,7 with platelets at 179. Follow as outpatient.    DVT prophylaxis: scd  Code Status:  full Family Communication:  No family at the bedside  Disposition Plan:  pending social services evaluation, placement snf.   Consultants:   Surgery   Procedures:     Antimicrobials:      Subjective: Patient is feeling better, abdominal pain has improved, no nausea or vomiting, no dyspnea or chest pain.   Objective: Vitals:   04/20/18 1200 04/20/18 1507 04/20/18 2109 04/21/18 0522  BP:  133/71 (!) 148/71 123/66  Pulse:  81 70 81  Resp:  16 18 18   Temp: (!) 97.5 F (36.4 C) 98 F (36.7 C) 97.9 F (36.6 C) 98.6 F (37 C)  TempSrc: Axillary Oral Oral Oral  SpO2:  100% 99% 100%  Weight:      Height:        Intake/Output Summary (Last 24 hours) at 04/21/2018 0853 Last data filed at 04/21/2018 0200 Gross per 24 hour  Intake 995.83 ml  Output 50 ml  Net 945.83 ml   Filed Weights   04/08/18 1249 04/08/18 2000 04/16/18 1417  Weight: 63 kg (139 lb) 72 kg (158 lb 11.7 oz) 74.1 kg (163 lb 5.8 oz)    Examination:   General: Not  in pain or dyspnea, deconditioned  Neurology: Awake and alert, non focal  E ENT: mild pallor, no icterus, oral mucosa moist Cardiovascular: No JVD. S1-S2 present, rhythmic, no gallops, rubs, or murmurs. No lower extremity edema. Pulmonary: vesicular breath sounds bilaterally, adequate air movement, no wheezing, rhonchi or rales. Gastrointestinal. Abdomen with no organomegaly,  non tender, no rebound or guarding, mid line surgical wound in place.  Skin. No rashes Musculoskeletal: no joint deformities     Data Reviewed: I have personally reviewed following labs and imaging studies  CBC: Recent Labs  Lab 04/17/18 0617 04/18/18 0313 04/19/18 0318 04/20/18 0517 04/21/18 0410  WBC 8.1 5.5 4.4 3.5* 3.0*  NEUTROABS 6.5 4.5 3.3 2.3 1.6*  HGB 9.4* 9.1* 9.0* 10.1* 9.9*  HCT 29.4* 27.6* 27.1* 29.6* 29.7*  MCV 88.6 89.0 87.7 85.8 88.7  PLT 159 124* 143* 166 671   Basic Metabolic Panel: Recent Labs  Lab 04/15/18 0450  04/18/18 0800 04/19/18 0318 04/19/18 0645 04/20/18 0517 04/21/18 0410  NA 133*   < > 137 134* 138 140 140  K 4.0   < > 3.9 5.3* 4.1 3.8 3.7  CL 102   < > 112* 112* 112* 112* 113*  CO2 25   < > 21* 21* 21* 23 23  GLUCOSE 120*   < > 78 382* 110* 99 85  BUN 22*   < > 17 14 14 13 12   CREATININE 1.11*   < > 0.87 0.84 0.85 0.86 0.75  CALCIUM 9.2   < > 8.5* 8.1* 8.7* 9.0 8.8*  MG 1.8  --   --   --   --   --   --    < > = values in this interval not displayed.   GFR: Estimated Creatinine Clearance: 50.8 mL/min (by C-G formula based on SCr of 0.75 mg/dL). Liver Function Tests: No results for input(s): AST, ALT, ALKPHOS, BILITOT, PROT, ALBUMIN in the last 168 hours. No results for input(s): LIPASE, AMYLASE in the last 168 hours. No results for input(s): AMMONIA in the last 168 hours. Coagulation Profile: No results for input(s): INR, PROTIME in the last 168 hours. Cardiac Enzymes: No results for input(s): CKTOTAL, CKMB, CKMBINDEX, TROPONINI in the last 168 hours. BNP (last 3 results) No results for input(s): PROBNP in the last 8760 hours. HbA1C: No results for input(s): HGBA1C in the last 72 hours. CBG: Recent Labs  Lab 04/19/18 2352 04/20/18 0540 04/20/18 1202 04/20/18 2325 04/21/18 0512  GLUCAP 109* 93 101* 96 78   Lipid Profile: No results for input(s): CHOL, HDL, LDLCALC, TRIG, CHOLHDL, LDLDIRECT in the last 72 hours. Thyroid  Function Tests: No results for input(s): TSH, T4TOTAL, FREET4, T3FREE, THYROIDAB in the last 72 hours. Anemia Panel: No results for input(s): VITAMINB12, FOLATE, FERRITIN, TIBC, IRON, RETICCTPCT in the last 72 hours.    Radiology Studies: I have reviewed all of the imaging during this hospital visit personally     Scheduled Meds: . chlorhexidine  15 mL Mouth Rinse BID  . Chlorhexidine Gluconate Cloth  6 each Topical Daily  . enoxaparin (LOVENOX) injection  40 mg Subcutaneous Q24H  . famotidine  20 mg Oral BID  . lisinopril  10 mg Oral Daily  . mouth rinse  15 mL Mouth Rinse q12n4p  . multivitamin with minerals  1 tablet Oral Daily  . psyllium  1 packet Oral Daily   Continuous Infusions: . dextrose 5% lactated ringers 1,000 mL (04/21/18 0409)     LOS: 13 days  Mauricio Gerome Apley, MD [facility] Pager [phone]

## 2018-04-21 NOTE — Clinical Social Work Note (Signed)
Clinical Social Work Assessment  Patient Details  Name: Erika Day MRN: [id number] Date of Birth: [birth year]  Date of referral:  04/21/18               Reason for consult:  Facility Placement                Permission sought to share information with:  Family Supports Permission granted to share information::  Yes, Verbal Permission Granted  Name::        Agency::  SNF   Relationship::  Daughter and Spouse   Contact Information:     Housing/Transportation Living arrangements for the past 2 months:  [place] of Information:  Adult Children, Spouse Patient Interpreter Needed:  None Criminal Activity/Legal Involvement Pertinent to Current Situation/Hospitalization:  No - Comment as needed Significant Relationships:    Lives with:  Self Do you feel safe going back to the place where you live?  No Need for family participation in patient care:  Yes (Comment)  Care giving concerns:  SNF placement for rehab.   Social Worker assessment / plan:  Patient alert to self. CSW spoke with patient daughter Erika Day and Spouse about SNF placement. Patient daughter reports the patient went to SNF in past but did not have a great experience, therefore the patient may seem resistant to go. Patient daughter reports she uses a wheelchair and can transfer, she cooks, and needs assistance with bathing. Patient lives with her spouse.  CSW explain SNF process to daughter and provided list of offers so far.   FL2 Done, PASRR checked.   Plan: SNF placement for short rehab.   Employment status:  Retired Forensic scientist:  Medicaid In [place] PT Recommendations:    Information / Referral to community resources:  [place]  Patient/Family's Response to care:  Agreeable to care and SNF placement.  Patient/Family's Understanding of and Emotional Response to Diagnosis, Current Treatment, and Prognosis: Patient daughter has a good understanding of the patient  diagnosis and treatment. She is hopeful the patient the patient will be able to transfer from wheel chair after rehab.  Patient daughter very involved in patient care. Unsure of spouse understanding of patient diagnosis.   Emotional Assessment Appearance:  Appears stated age Attitude/Demeanor/Rapport:    Affect (typically observed):    Orientation:  Oriented to Self Alcohol / Substance use:  Not Applicable Psych involvement (Current and /or in the community):  No (Comment)  Discharge Needs  Concerns to be addressed:  Discharge Planning Concerns Readmission within the last 30 days:  No Current discharge risk:  Dependent with Mobility Barriers to Discharge:  Continued Medical Work up, [place], LCSW 04/21/2018, 11:45 AM

## 2018-04-21 NOTE — Progress Notes (Signed)
[facility] Progress Note  5 Days Post-Op  Subjective: CC:  Erika Day denies pain. She is having flatus and bowel movements. When I asked her how PT went she states "I didn't do too good, but I did my best". She is agreeable to SNF.   Objective: Vital signs in last 24 hours: Temp:  [97.5 F (36.4 C)-98.6 F (37 C)] 98.6 F (37 C) (06/20 0522) Pulse Rate:  [70-81] 81 (06/20 0522) Resp:  [16-18] 18 (06/20 0522) BP: (123-148)/(66-71) 123/66 (06/20 0522) SpO2:  [99 %-100 %] 100 % (06/20 0522) Last BM Date: 04/20/18  Intake/Output from previous day: 06/19 0701 - 06/20 0700 In: 995.8 [P.O.:150; I.V.:845.8] Out: 150 [Urine:150] Intake/Output this shift: No intake/output data recorded.  PE: Gen:  Alert, NAD, resting comfortably in bed  Pulm:  Normal effort Abd: Soft, appropriately tender around surgical incisions, +BS, incision c/d/i Skin: warm and dry, no rashes  Psych: A&Ox3   Lab Results:  Recent Labs    04/20/18 0517 04/21/18 0410  WBC 3.5* 3.0*  HGB 10.1* 9.9*  HCT 29.6* 29.7*  PLT 166 179   BMET Recent Labs    04/20/18 0517 04/21/18 0410  NA 140 140  K 3.8 3.7  CL 112* 113*  CO2 23 23  GLUCOSE 99 85  BUN 13 12  CREATININE 0.86 0.75  CALCIUM 9.0 8.8*   PT/INR No results for input(s): LABPROT, INR in the last 72 hours. CMP     Component Value Date/Time   NA 140 04/21/2018 0410   NA 135 12/07/2017 1427   NA 141 02/19/2017 1111   K 3.7 04/21/2018 0410   K 4.3 02/19/2017 1111   CL 113 (H) 04/21/2018 0410   CO2 23 04/21/2018 0410   CO2 26 02/19/2017 1111   GLUCOSE 85 04/21/2018 0410   GLUCOSE 72 02/19/2017 1111   BUN 12 04/21/2018 0410   BUN 20 12/07/2017 1427   BUN 16.7 02/19/2017 1111   CREATININE 0.75 04/21/2018 0410   CREATININE 0.9 02/19/2017 1111   CALCIUM 8.8 (L) 04/21/2018 0410   CALCIUM 10.2 02/19/2017 1111   PROT 6.4 (L) 04/13/2018 0435   PROT 7.3 08/23/2017 1542   PROT 7.5 02/19/2017 1111   ALBUMIN 3.5 04/13/2018 0435    ALBUMIN 4.1 08/23/2017 1542   ALBUMIN 3.8 02/19/2017 1111   AST 22 04/13/2018 0435   AST 14 02/19/2017 1111   ALT 15 04/13/2018 0435   ALT 8 02/19/2017 1111   ALKPHOS 45 04/13/2018 0435   ALKPHOS 92 02/19/2017 1111   BILITOT 0.5 04/13/2018 0435   BILITOT 0.3 08/23/2017 1542   BILITOT 0.36 02/19/2017 1111   GFRNONAA >60 04/21/2018 0410   GFRAA >60 04/21/2018 0410   Lipase     Component Value Date/Time   LIPASE 47 04/08/2018 1430       Studies/Results: No results found.  Anti-infectives: Anti-infectives (From admission, onward)   Start     Dose/Rate Route Frequency Ordered Stop   04/16/18 1030  ertapenem (INVANZ) 1 g in sodium chloride 0.9 % 100 mL IVPB     1 g 200 mL/hr over 30 Minutes Intravenous On call to O.R. 04/16/18 1016 04/16/18 2113   04/08/18 1715  vancomycin (VANCOCIN) 1,250 mg in sodium chloride 0.9 % 250 mL IVPB  Status:  Discontinued     1,250 mg 166.7 mL/hr over 90 Minutes Intravenous  Once 04/08/18 1711 04/08/18 1732   04/08/18 1715  cefTRIAXone (ROCEPHIN) 1 g in sodium chloride 0.9 % 100  mL IVPB  Status:  Discontinued     1 g 200 mL/hr over 30 Minutes Intravenous  Once 04/08/18 1711 04/08/18 1732   04/08/18 1715  ampicillin (OMNIPEN) 1 g in sodium chloride 0.9 % 100 mL IVPB  Status:  Discontinued     1 g 300 mL/hr over 20 Minutes Intravenous  Once 04/08/18 1711 04/08/18 1728     Assessment/Plan Small cell lymphoma - axilla/CLL Hx of tobacco use Hx of hypertension/hypotension Hx of CVA Diabetes DJD/osteoarthritis  SBO 04/08/18 - Hx of Sigmoid colectomy 06/2016/Ventral hernia repair/LOA 04/2014 Lysis of adhesions 04/16/18, Dr. Armandina Gemma  - [place] d/ced 6/18  - having flatus and BMs - tolerating SOFT diet - OOB to chair, PT/OT recommended SNF   FEN: SOFT  ID:  None currently DVT:  Lovenox Foley: In Follow up:  Dr. Harlow Asa  Plan: stable for discharge to SNF. Will need staple removal 6/28. F/U w/ Dr. Harlow Asa in 2-3 weeks. Patient would  definitely benefit from continued PT/OT.      LOS: 13 days    Jill Alexanders , [facility] 04/21/2018, 10:33 AM Pager: [phone] Consults: [phone] Mon-Fri 7:00 am-4:30 pm Sat-Sun 7:00 am-11:30 am

## 2018-04-22 ENCOUNTER — Inpatient Hospital Stay (HOSPITAL_COMMUNITY): Payer: Medicare Other

## 2018-04-22 LAB — GLUCOSE, CAPILLARY
GLUCOSE-CAPILLARY: 62 mg/dL — AB (ref 65–99)
GLUCOSE-CAPILLARY: 82 mg/dL (ref 65–99)
GLUCOSE-CAPILLARY: 85 mg/dL (ref 65–99)
Glucose-Capillary: 77 mg/dL (ref 65–99)

## 2018-04-22 MED ORDER — ADULT MULTIVITAMIN W/MINERALS CH: 1.0000 | ORAL_TABLET | Freq: Every day | ORAL | 0 refills | Status: AC

## 2018-04-22 MED ORDER — ACETAMINOPHEN 325 MG PO TABS: 650.0000 mg | ORAL_TABLET | ORAL | 0 refills | Status: DC | PRN

## 2018-04-22 NOTE — Evaluation (Signed)
Clinical/Bedside Swallow Evaluation Patient Details  Name: Erika Day MRN: [id number] Date of Birth: [birth year]  Today's Date: 04/22/2018 Time: SLP Start Time (ACUTE ONLY): 3825 SLP Stop Time (ACUTE ONLY): 1416 SLP Time Calculation (min) (ACUTE ONLY): 19 min  Past Medical History:  Past Medical History:  Diagnosis Date  . Acid reflux disease   . Cancer ([place])   . CLL (chronic lymphocytic leukemia) ([place]) 05/15/2016  . DDD (degenerative disc disease), lumbar   . Degenerative joint disease   . Diabetes mellitus without complication [facility])    Patient states this was a misdiagnosis from years ago  . Dyslipidemia   . History of diabetes mellitus 04/19/2017  . Hypertension   . Marginal zone lymphoma of axilla ([place]) 05/15/2016  . Panic attacks   . Pneumonia 02/2016  . 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 Stroke [facility]) age 57 yo   poorly controlled DM and Hypertension   Past Surgical History:  Past Surgical History:  Procedure Laterality Date  . ABDOMINAL HYSTERECTOMY  ? 08/25/2011 ?   Has BSO for left benign ovarian tumor with torsion and broad ligament benign tumor, but does not appear had hysterectomy--[facility], NOT ovarian cancer  . CHOLECYSTECTOMY    . FEMUR IM NAIL Left 11/23/2016   Procedure: INTRAMEDULLARY (IM) NAIL FEMORAL;  Surgeon: Gaynelle Arabian, MD;  Location: [facility];  Service: Orthopedics;  Laterality: Left;  . FLEXIBLE SIGMOIDOSCOPY N/A 05/19/2016   Procedure: FLEXIBLE SIGMOIDOSCOPY;  Surgeon: [name], MD;  Location: [facility];  Service: Endoscopy;  Laterality: N/A;  . FLEXIBLE SIGMOIDOSCOPY N/A 06/28/2016   Procedure: FLEXIBLE SIGMOIDOSCOPY;  Surgeon: Ladene Artist, MD;  Location: [facility];  Service: Endoscopy;  Laterality: N/A;  . LAPAROSCOPIC SIGMOID COLECTOMY N/A 06/30/2016   Procedure: LAPAROSCOPIC SIGMOID COLECTOMY,rigid sigmoidoscopy, repair recurrent incisional hernia;  Surgeon: Michael Boston, MD;  Location: [facility];  Service:  General;  Laterality: N/A;  . LAPAROTOMY N/A 04/29/2014   Procedure: EXPLORATORY LAPAROTOMY;  Surgeon: Imogene Burn. Georgette Dover, MD;  Location: [place];  Service: General;  Laterality: N/A;  . LAPAROTOMY N/A 04/16/2018   Procedure: EXPLORATORY LAPAROTOMY WITH LYSIS OF ADHESIONS;  Surgeon: Armandina Gemma, MD;  Location: [facility];  Service: General;  Laterality: N/A;  . LYSIS OF ADHESION N/A 04/29/2014   Procedure: EXTENSIVE LYSIS OF ADHESIONS;  Surgeon: Imogene Burn. Georgette Dover, MD;  Location: [place];  Service: General;  Laterality: N/A;  . VENTRAL HERNIA REPAIR N/A 04/29/2014   Procedure: PRIMARY REPAIR OF VENTRAL HERNIA;  Surgeon: Imogene Burn. Tsuei, MD;  Location: [facility];  Service: General;  Laterality: N/A;   HPI:  84 year old female with a history of marginal zone lymphoma, MGUS,diabetes, hypertension,previous history of sigmoid colectomy due to second recurrence of sigmoid volvulus in 2017,who presented to the ER today with chief complaint of right upper quadrant abdominal pain starting at 3 AM this morning associated with an episode of vomiting and diarrhea. Patient describes a feeling of being constipated.she complains of diffuse abdominal pain. No chest pain or shortness of breath , no fever chills, rigors. Last BM was yesterday at 11:00 am . Unable to belch or pass flatusED course.  Patient with choking episode this AM with her breakfast tray and now with worsening lung sounds.     Assessment / Plan / Recommendation Clinical Impression  Clinical swallowing evaluation was completed using thin liquids, pureed material and dry solids following a choking even with breakfast tray this AM.  The patient endorses intermittent trouble swallowing  characterized by coughing with intake.  Patient now with worsened lung sounds.  Oral mechanism exam was completed and remkarkable for generalized weakness.  The patient presented with a possible oropharyngeal and esohpageal dysphagia. A delay in oral transit was seen especially for dry  solids that may have been impacted by lack of dentition.  Patient with audible swallow across all bolus types.  Signficant belching seen.  Intermittent throat clear seen across all textures.  Given this as well as worsening lung sounds recommend MBS to determine current swallowing physiology and least restrictive diet.   SLP Visit Diagnosis: Dysphagia, unspecified (R13.10)    Aspiration Risk  Moderate aspiration risk    Diet Recommendation   Dysphagia 3 with thin with full supervision pending MBS.  Medication Administration: Whole meds with puree    Other  Recommendations Oral Care Recommendations: Oral care BID   Follow up Recommendations Other (comment)(TBD)        Swallow Study   General Date of Onset: 04/07/18 HPI: 84 year old female with a history of marginal zone lymphoma, MGUS,diabetes, hypertension,previous history of sigmoid colectomy due to second recurrence of sigmoid volvulus in 2017,who presented to the ER today with chief complaint of right upper quadrant abdominal pain starting at 3 AM this morning associated with an episode of vomiting and diarrhea. Patient describes a feeling of being constipated.she complains of diffuse abdominal pain. No chest pain or shortness of breath , no fever chills, rigors. Last BM was yesterday at 11:00 am . Unable to belch or pass flatusED course.  Patient with choking episode this AM with her breakfast tray and now with worsening lung sounds.   Type of Study: Bedside Swallow Evaluation Previous Swallow Assessment: 05/15/2015 Diet Prior to this Study: Dysphagia 3 (soft);Thin liquids Temperature Spikes Noted: No Respiratory Status: Room air History of Recent Intubation: No Behavior/Cognition: Alert;Cooperative Oral Cavity Assessment: Within Functional Limits Oral Care Completed by SLP: No Oral Cavity - Dentition: Edentulous Vision: Functional for self-feeding Self-Feeding Abilities: Needs assist Patient Positioning: Upright in bed Baseline  Vocal Quality: Normal Volitional Cough: Strong Volitional Swallow: Unable to elicit    Oral/Motor/Sensory Function Overall Oral Motor/Sensory Function: Within functional limits   Ice Chips Ice chips: Impaired Presentation: Spoon Oral Phase Impairments: Impaired mastication Oral Phase Functional Implications: Prolonged oral transit   Thin Liquid Thin Liquid: Impaired Presentation: Spoon;Cup;Straw Pharyngeal  Phase Impairments: Throat Clearing - Immediate    Nectar Thick Nectar Thick Liquid: Not tested   Honey Thick Honey Thick Liquid: Not tested   Puree Puree: Impaired Presentation: Spoon Oral Phase Impairments: Impaired mastication Oral Phase Functional Implications: Prolonged oral transit Pharyngeal Phase Impairments: Throat Clearing - Immediate   Solid   GO   Solid: Impaired Presentation: Self Fed Oral Phase Impairments: Impaired mastication Oral Phase Functional Implications: Prolonged oral transit Pharyngeal Phase Impairments: Throat Clearing - Immediate       Shelly Flatten, MA, CCC-SLP Acute Rehab SLP [phone] Lamar Sprinkles 04/22/2018,2:27 PM

## 2018-04-22 NOTE — Clinical Social Work Placement (Signed)
    3:55 PM Patient and family chose bed at [facility].  LCSW confirmed bed with facility.   LCSW faxed dc docs to facility.   Patient will transport by [facility].   LCSW notified family of transport.   RN report number: [id number]  [facility]  CLINICAL SOCIAL WORK PLACEMENT  NOTE  Date:  04/22/2018  Patient Details  Name: Erika Day MRN: [id number] Date of Birth: 1929-11-21  Clinical Social Work is seeking post-discharge placement for this patient at the [place] level of care (*CSW will initial, date and re-position this form in  chart as items are completed):  Yes   Patient/family provided with [place] Work Department's list of facilities offering this level of care within the geographic area requested by the patient (or if unable, by the patient's family).  Yes   Patient/family informed of their freedom to choose among providers that offer the needed level of care, that participate in Medicare, Medicaid or managed care program needed by the patient, have an available bed and are willing to accept the patient.  Yes   Patient/family informed of [employer]'s ownership interest in [facility] and [facility], as well as of the fact that they are under no obligation to receive care at these facilities.  PASRR submitted to EDS on       PASRR number received on       Existing PASRR number confirmed on 04/21/18     FL2 transmitted to all facilities in geographic area requested by pt/family on       FL2 transmitted to all facilities within larger geographic area on       Patient informed that his/her managed care company has contracts with or will negotiate with certain facilities, including the following:  [facility]     Yes   Patient/family informed of bed offers received.  Patient chooses bed at [facility]     Physician recommends and patient chooses bed at      Patient to be transferred to  [facility] on  .  Patient to be transferred to facility by [facility]     Patient family notified on   of transfer.  Name of family member notified:  Daughter-[name]     PHYSICIAN Please prepare priority discharge summary, including medications     Additional Comment:    _______________________________________________ Servando Snare, LCSW 04/22/2018, 3:55 PM

## 2018-04-22 NOTE — Discharge Summary (Addendum)
Physician Discharge Summary  Erika Day QQV:[id number] DOB: [birth year] DOA: 04/08/2018  PCP: Mack Hook, MD  Admit date: 04/08/2018 Discharge date: 04/22/2018  Admitted From: Home  Disposition:  snf   Recommendations for Outpatient Follow-up and new medication changes:  1. Follow up with Dr. Amil Amen in one week.  2. Staple removal on 04/29/2018 3. Follow up with Dr. Harlow Asa from surgery in 2 to 3 weeks.   Home Health: na  Equipment/Devices: na   Discharge Condition: stable  CODE STATUS: full  Diet recommendation: heart healthy diet.    SLP Diet Recommendations: Dysphagia 3 (Mech soft) solids;Thin liquid Liquid Administration via: Cup Medication Administration: Crushed with puree Supervision: Full assist for feeding Compensations: Slow rate;Small sips/bites Postural Changes: Remain semi-upright after after feeds/meals (Comment);Seated upright at 90 degrees Oral Care Recommendations: Oral care before and after PO   Brief/Interim Summary: 84 year old female who presented with right upper quadrant abdominal pain. She does have the significant past medical history of marginal zone lymphoma, MGUS, history of sigmoid volvulus status post colectomy 2017. Acute onset of abdominal pain, associated with constipation, no nausea or vomiting. On initial physical examination her blood pressure was 180/90, heart rate 75, respirations 17, temperature 97.6, oxygen saturation 98%. Moist mucous membranes, lungs were clear to auscultation bilaterally, heart S1-S2 present rhythmic, abdomen was tender diffusely, no masses were palpated, bowel sounds were positive, no lower extremity edema. Sodium 132, potassium 4.3, chloride 97, bicarb 26, glucose 119, BUN 20, creatinine 1.23, white count 4.2, hemoglobin 12.2, hematocrit 36.4, platelets 161, urine analysis specific gravity 1.027. CT of the abdomen had multiple dilated small bowel loops in the abdomen and pelvis, suggesting small bowel obstruction.     Patient was admitted to the medical unit with the working diagnosis of small bowel obstruction, it with acute kidney injury.  Patient failed conservative therapy and underwent exploratory laparotomy with lysis of adhesions June 15.  1.  Acute small bowel obstruction.  Patient was admitted to the medical ward with a remote telemetry monitor, she was placed on intravenous fluids, nothing by mouth, a nasogastric tube was placed and connected to low intermittent suction, received as needed intravenous antiemetics and analgesics.  Patient did not respond well to conservative therapy, surgery was consulted and patient underwent exploratory laboratory with lysis of adhesions.  Patient was transferred to the stepdown unit postoperatively, she recovered bowel function, nasogastric tube was removed and her diet was advanced successfully.  Patient will need staple removal June 28.  Follow-up with general surgery in 2 to 3 weeks with Dr. Harlow Asa.  Patient was found significantly weak and deconditioned, physical therapy evaluation was performed, recommendations for skilled nursing facility at discharge.   2.  Prerenal acute kidney injury on CKD stage 2.  Patient received isotonic IV fluids, discharge creatinine 0.75, a serum potassium of 3.7 and serum bicarbonate 23.  Will hold on furosemide, to prevent further kidney injury.  2.  Type 2 diabetes mellitus.  Patient was placed on insulin sliding scale for glucose coverage and monitoring, required IV dextrose due to episodes of hypoglycemia.  Nutritionist was consulted, and patient was placed on the nutritional supplements.  Will need close follow-up on capillary glucose.  3.  Hypertension.  Blood pressure remained stable on lisinopril.  4.  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  Patient has chronic pancytopenia, follows up with the oncology clinic with Dr. Alvy Bimler.  Old records personally reviewed, oncology visit  in May 2019, no signs of  cancer recurrence.     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[facility])   Sigmoid volvulus s/p lap sigmoid colectomy 06/30/2016   Volvulus of sigmoid colon ([facility])   MGUS (monoclonal gammopathy of unknown significance)    Discharge Instructions   Allergies as of 04/22/2018      Reactions   Morphine And Related Other (See Comments)   Blood sugar dropped one-time      Medication List    STOP taking these medications   fexofenadine 180 MG tablet Commonly known as:  ALLEGRA   fluticasone 50 MCG/ACT nasal spray Commonly known as:  FLONASE   furosemide 20 MG tablet Commonly known as:  LASIX   methocarbamol 500 MG tablet Commonly known as:  ROBAXIN     TAKE these medications   acetaminophen 325 MG tablet Commonly known as:  TYLENOL Take 2 tablets (650 mg total) by mouth every 4 (four) hours as needed for mild pain, moderate pain, fever or headache.   cetirizine 10 MG tablet Commonly known as:  ZYRTEC 1 tab by mouth daily for allergy symptoms   dicyclomine 10 MG capsule Commonly known as:  BENTYL 1 cap by mouth every 6 hours as needed for abdominal cramping.   lisinopril 20 MG tablet Commonly known as:  PRINIVIL,ZESTRIL Take 1 tablet (20 mg total) by mouth daily.   LOTEMAX 0.5 % ophthalmic suspension Generic drug:  loteprednol Place 1 drop into the left eye daily.   Medical Compression Stockings Misc 20-30 mm Hg with toe thigh high graduated compression stockings on every morning and off at night.   multivitamin with minerals Tabs tablet Take 1 tablet by mouth daily.   ondansetron 4 MG tablet Commonly known as:  ZOFRAN 1-2 by mouth every 8 hours as needed for nausea and vomiting.   pantoprazole 40 MG tablet Commonly known as:   PROTONIX 1 tab by mouth daily 1/2 hour before breakfast on an empty stomach   polyethylene glycol powder powder Commonly known as:  GLYCOLAX/MIRALAX MIX 1 TABLESPOONFUL IN 8 OZ OF WATER/JUICE D       Contact information for follow-up providers    [facility] Go on 04/29/2018.   Specialty:  General Surgery Why:  at 2:30 PM for staple removal. please arrive 30 minutes early. Contact information: [address] [place] [phone]       Armandina Gemma, MD. Call on 04/21/2018.   Specialty:  General Surgery Why:  to confirm post-operative follow up appointment/date time. please arrive 15 minutes early. Contact information: [address] [place] [address] [phone]            Contact information for after-discharge care    [facility] .   Service:  Skilled Nursing Contact information: [address] [address] [phone]                 Allergies  Allergen Reactions  . Morphine And Related Other (See Comments)    Blood sugar dropped one-time    Consultations:  General surgery   Procedures/Studies: Dg Abd 1 View  Result Date: 04/13/2018 CLINICAL DATA:  Abdominal distension EXAM: ABDOMEN - 1 VIEW COMPARISON:  April 12, 2018 FINDINGS: Nasogastric tube tip and  side port are in the stomach. There is moderate stool throughout the colon. There is no bowel dilatation or air-fluid level to suggest bowel obstruction. No evident free air. There is degenerative change in the lumbar spine. There is postoperative change in the left proximal femur. IMPRESSION: Nasogastric tube tip and side port in stomach. No bowel obstruction or free air demonstrable. Moderate stool in colon. Electronically Signed   By: Lowella Grip III M.D.   On: 04/13/2018 07:15   Dg Abd 1 View  Result Date: 04/12/2018 CLINICAL DATA:  Abdominal distension EXAM:  ABDOMEN - 1 VIEW COMPARISON:  04/11/2018 FINDINGS: Scattered large and small bowel gas is noted. Fecal material is noted within the right colon. Nasogastric catheter is seen within the stomach. No obstructive changes are noted. The overall appearance is similar to that seen on prior exam. IMPRESSION: Stable appearance of the abdomen when compared from the previous day. Electronically Signed   By: Inez Catalina M.D.   On: 04/12/2018 08:54   Dg Abd 1 View  Result Date: 04/11/2018 CLINICAL DATA:  Small bowel obstruction. EXAM: ABDOMEN - 1 VIEW COMPARISON:  Radiographs dated 04/10/2018, 04/09/2018 and 04/08/2018 and CT scan dated 04/08/2018 FINDINGS: NG tube tip in the body of the stomach. Decreased air in the bowel since the prior study. No discrete dilated small bowel loops. No acute bone abnormality. IMPRESSION: Improved bowel gas pattern. No visible residual dilated small bowel loops. Electronically Signed   By: Lorriane Shire M.D.   On: 04/11/2018 08:47   Dg Abd 1 View  Result Date: 04/10/2018 CLINICAL DATA:  Small bowel obstruction EXAM: ABDOMEN - 1 VIEW COMPARISON:  04/09/2018 abdominal radiograph FINDINGS: Mildly to moderately dilated small bowel loops throughout the abdomen, mildly improved. Moderate colonic stool volume. No evidence of pneumatosis or pneumoperitoneum. No radiopaque nephrolithiasis. Decreased excreted contrast in the bladder. Marked lumbar spondylosis. Partially visualized left femoral neck pin. Surgical sutures overlie the left deep pelvis. IMPRESSION: Mildly to moderately dilated small bowel loops throughout the abdomen, mildly improved, suggesting improving small bowel obstruction. Electronically Signed   By: Ilona Sorrel M.D.   On: 04/10/2018 08:03   Dg Abdomen 1 View  Result Date: 04/08/2018 CLINICAL DATA:  Nasogastric tube placement. EXAM: ABDOMEN - 1 VIEW COMPARISON:  Abdomen and pelvis CT obtained earlier today. FINDINGS: Interval nasogastric tube with its tip in the distal  stomach and side hole in the mid to distal stomach. Dilated small bowel loops, better seen on the recent CT. Excreted contrast in the urinary bladder. Extensive lumbar spine degenerative changes and mild scoliosis. Left proximal femur fixation hardware. IMPRESSION: 1. Nasogastric tube tip and side hole in the stomach. 2. Previously noted changes of small bowel obstruction. This may be due to an internal hernia, based on swirling of the mesentery in the right mid to upper abdomen on the CT. Electronically Signed   By: Claudie Revering M.D.   On: 04/08/2018 18:54   Ct Abdomen Pelvis W Contrast  Result Date: 04/15/2018 CLINICAL DATA:  Lymphoma. Diabetes. Hypertension. Sigmoid colectomy secondary to volvulus. Right upper quadrant abdominal pain, vomiting, diarrhea. Abnormal chest radiograph. EXAM: CT ABDOMEN AND PELVIS WITH CONTRAST TECHNIQUE: Multidetector CT imaging of the abdomen and pelvis was performed using the standard protocol following bolus administration of intravenous contrast. CONTRAST:  110mL ISOVUE-300 IOPAMIDOL (ISOVUE-300) INJECTION 61% COMPARISON:  Plain films earlier today.  Most recent CT 04/08/2018 FINDINGS: Lower chest: Right hemidiaphragm elevation with right lung base volume loss and increased atelectasis. Normal heart size  with multivessel coronary artery atherosclerosis. Increase in small right pleural effusion. Hepatobiliary: Normal liver. Cholecystectomy, without biliary ductal dilatation. Pancreas: Normal pancreas for age, with atrophy. Spleen: Normal in size, without focal abnormality. Adrenals/Urinary Tract: Normal adrenal glands. Mild bilateral renal cortical atrophy. left renal collecting system punctate calculus. No hydronephrosis. Stomach/Bowel: Stomach is normal in caliber. The colon is normal in caliber. Proximal and mid small bowel loops are positioned within the far right upper quadrant, just under the elevated right hemidiaphragm. They have undergone interval increase in  dilatation, including at 4.5 cm on image 22/3 versus on the order of 3.8 cm on the prior exam. There is increased adjacent ascites. No pneumatosis or free intraperitoneal air. There is twisting of the subtending small-bowel mesentery and mesenteric vessels, including on image 44/3. There is at least 1 small bowel transition in this area including on image 40/3. Vascular/Lymphatic: Advanced aortic and branch vessel atherosclerosis. No abdominopelvic adenopathy. Reproductive: Uterine fibroids. The uterus is positioned eccentric right. No dominant adnexal mass. Other: Slight increase in moderate free fluid within the pelvis. Moderate pelvic floor laxity. Musculoskeletal: Osteopenia. Proximal left femoral fixation. Lumbosacral spine fixation. IMPRESSION: 1. Progressive dilatation of small bowel loops within the high right upper quadrant, under an elevated right hemidiaphragm. Morphology of the bowel and small bowel mesentery in this region suggests internal hernia and possible closed loop obstruction. Increased right upper quadrant fluid in this region. 2. Increased right pleural effusion with adjacent right base volume loss and atelectasis. 3. Coronary artery atherosclerosis. Aortic Atherosclerosis (ICD10-I70.0). 4. Uterine fibroids. 5. Left nephrolithiasis. These results will be called to the ordering clinician or representative by the Radiologist Assistant, and communication documented in the PACS or zVision Dashboard. Electronically Signed   By: Abigail Miyamoto M.D.   On: 04/15/2018 21:39   Ct Abdomen Pelvis W Contrast  Result Date: 04/08/2018 CLINICAL DATA:  Right upper quadrant abdomen pain since this morning. History of lymphoma. Patient has had prior surgery for obstruction in the past demonstrating lymphoma involvement of GI tract. EXAM: CT ABDOMEN AND PELVIS WITH CONTRAST TECHNIQUE: Multidetector CT imaging of the abdomen and pelvis was performed using the standard protocol following bolus administration of  intravenous contrast. CONTRAST:  62mL OMNIPAQUE IOHEXOL 300 MG/ML  SOLN COMPARISON:  Mar 05, 2017 FINDINGS: Lower chest: Marked elevated right hemidiaphragm with dilated small bowel bowel loops in the mid and lower right hemithorax. The heart size is enlarged. There is atelectasis of the posterior lung bases. Hepatobiliary: No focal liver abnormality is seen. Status post cholecystectomy. Mild intrahepatic biliary ductal dilatation is identified, likely postsurgical. Pancreas: Unremarkable. No pancreatic ductal dilatation or surrounding inflammatory changes. Spleen: Normal in size without focal abnormality. Adrenals/Urinary Tract: Adrenal glands are unremarkable. Kidneys are normal, without focal lesion, or hydronephrosis. Small nonobstructing stone is identified within the left kidney. Asymmetric right side and anterior bladder bowel wall thickening is noted. Stomach/Bowel: There are multiple dilated small bowel loops in the abdomen pelvis including small bowel loops extending in the right mid and lower thorax. There is no evidence of diverticulitis. Moderate bowel content is identified throughout colon. The stomach is stable. Vascular/Lymphatic: Aortic atherosclerosis. No enlarged abdominal or pelvic lymph nodes. Reproductive: Status post prior hysterectomy. Solid masslike area with associated calcification in the right adnexa question right ovary unchanged compared prior CT. Other: Ascites is noted in the abdomen and pelvis. Musculoskeletal: Degenerative joint changes of the spine are noted. Prior left hip surgery is noted. IMPRESSION: Multiple dilated small bowel loops in the abdomen and  pelvis including small bowel is loops extending in the right mid and lower thorax due to mark elevated right hemidiaphragm. Findings are suspicious for developing small bowel obstruction. Asymmetric right side and anterior bladder bowel wall thickening. Consider further evaluation with direct visualization. Ascites is noted in the  abdomen and pelvis. Status post prior cholecystectomy and hysterectomy. Electronically Signed   By: Abelardo Diesel M.D.   On: 04/08/2018 16:26   Dg Chest Port 1 View  Result Date: 04/18/2018 CLINICAL DATA:  Increased shortness of breath this morning. EXAM: PORTABLE CHEST 1 VIEW COMPARISON:  Chest x-ray dated April 16, 2018. FINDINGS: Interval placement of a right upper extremity PICC line with the tip in the proximal right atrium. Interval removal of the left internal jugular central venous catheter. Unchanged enteric tube entering the stomach. Stable cardiomediastinal silhouette. Shallow inspiration with unchanged severe elevation of the right hemidiaphragm and right basilar atelectasis. Slightly increased left basilar atelectasis. No pleural effusion or pneumothorax. No acute osseous abnormality. Contrast within the hepatic flexure. Pneumoperitoneum has resolved. IMPRESSION: 1. Slightly worsened left basilar atelectasis. 2. Unchanged severe elevation of the right hemidiaphragm and right basilar atelectasis. Electronically Signed   By: Titus Dubin M.D.   On: 04/18/2018 08:13   Dg Chest Port 1 View  Result Date: 04/16/2018 CLINICAL DATA:  84 y/o F; central venous line. Status post exploratory laparotomy. EXAM: PORTABLE CHEST 1 VIEW COMPARISON:  04/15/2018 chest radiograph. FINDINGS: Pneumoperitoneum, likely postoperative. Enteric tube tip extends below the field of view into the abdomen. Left central venous catheter tip projects over the left brachiocephalic SVC junction. Calcific atherosclerosis of the aorta. Elevated right hemidiaphragm with right lung base atelectasis. Multilevel degenerative changes of the spine. No pleural effusion or pneumothorax. IMPRESSION: 1. Left central venous catheter tip projects over the left brachiocephalic/SVC junction. 2. Pneumoperitoneum, likely postoperative. 3. Stable elevated right hemidiaphragm with right basilar atelectasis. Electronically Signed   By: Kristine Garbe M.D.   On: 04/16/2018 13:24   Dg Chest Port 1 View  Result Date: 04/15/2018 CLINICAL DATA:  Follow-up CHF and small-bowel obstruction EXAM: PORTABLE CHEST 1 VIEW COMPARISON:  Portable exam 1036 hours compared to 11/23/2016 FINDINGS: Marked elevation of RIGHT diaphragm with significant RIGHT basilar atelectasis. Stable heart size and mediastinal contours. Atherosclerotic calcification aorta. Interval extubation. Minimal atelectasis at LEFT base. Minimal LEFT basilar atelectasis. Upper lungs clear. IMPRESSION: Chronic elevation of RIGHT diaphragm with RIGHT basilar atelectasis. Electronically Signed   By: Lavonia Dana M.D.   On: 04/15/2018 11:15   Dg Abd Portable 1v  Addendum Date: 04/15/2018   ADDENDUM REPORT: 04/15/2018 11:34 ADDENDUM: Study discussed by telephone with Dr. Horris Latino on 04/15/2018 at 1130 hours. Electronically Signed   By: Genevie Ann M.D.   On: 04/15/2018 11:34   Result Date: 04/15/2018 CLINICAL DATA:  84 year old female with small bowel obstruction. EXAM: PORTABLE ABDOMEN - 1 VIEW COMPARISON:  Radiograph 04/13/2018 and earlier. CT Abdomen and Pelvis 04/08/2018, 03/05/2017. FINDINGS: Portable AP supine view at 0927 hours. There remain right upper quadrant and right abdominal dilated small bowel loops measuring 50-56 millimeters diameter. The enteric tube has been removed. This patient has had prior partial sigmoidectomy for volvulus. On 04/08/2018 virtually all of the small bowel was within the right abdomen, a change since 03/05/2017. large bowel-gas pattern remains within normal limits. Left lung base appears stable. Chronic elevation of the right hemidiaphragm. Stable visualized osseous structures. No definite pneumoperitoneum on this supine view. IMPRESSION: 1. Appearance suspicious for a midgut volvulus with obstructed small  bowel in the right abdomen. 2. No pneumoperitoneum identified on this supine view. Electronically Signed: By: Genevie Ann M.D. On: 04/15/2018 11:25   Dg  Abd Portable 1v-small Bowel Obstruction Protocol-initial, 8 Hr Delay  Result Date: 04/10/2018 CLINICAL DATA:  Small-bowel obstruction, 8 hour delay EXAM: PORTABLE ABDOMEN - 1 VIEW COMPARISON:  0920 hours and 0443 hours on the same day. FINDINGS: Gastric tip and side-port project over the left hemiabdomen presumably in the expected location of the stomach. Dilated small bowel loops are proportion to large bowel persists within the mid abdomen. Stool is seen within the ascending and proximal transverse colon. Dextroconvex curvature of the lumbar spine with multilevel degenerative disc disease and endplate spurring consistent with lumbar spondylosis is stable. Sclerosis about the SI joints bilaterally. Partially imaged left femoral nail fixation. No acute osseous abnormality. IMPRESSION: Redemonstration of gaseous distention of small bowel out of proportion to large bowel loops. No significant change. Electronically Signed   By: Ashley Royalty M.D.   On: 04/10/2018 22:52   Dg Abd Portable 1v-small Bowel Protocol-position Verification  Result Date: 04/10/2018 CLINICAL DATA:  Nasogastric tube placement. Small bowel obstruction. EXAM: PORTABLE ABDOMEN - 1 VIEW COMPARISON:  04/10/2018 FINDINGS: There has been placement of a nasogastric tube with tip overlying the mid body of the stomach. Several dilated small bowel loops are again seen in the right upper quadrant, with elevation of right hemidiaphragm. IMPRESSION: Nasogastric tube tip overlies the mid gastric body. Electronically Signed   By: Earle Gell M.D.   On: 04/10/2018 11:38   Dg Abd Portable 1v-small Bowel Obstruction Protocol-initial, 8 Hr Delay  Result Date: 04/09/2018 CLINICAL DATA:  84 year old with small bowel obstruction. EXAM: PORTABLE ABDOMEN - 1 VIEW COMPARISON:  CT and abdominal radiograph from 04/08/2018 FINDINGS: There continues to be dilated loops of small bowel, particularly in the right mid and right upper abdomen. There is contrast in the  urinary bladder from recent CT examination. Persistent elevation of the right hemidiaphragm. Cholecystectomy clips are present. Surgical screw in the left femoral head. Scoliosis and degenerative changes in lumbar spine. Limited evaluation for free air on this portable supine view. IMPRESSION: Persistent dilated loops of small bowel with minimal change from the recent comparison examination. Findings remain compatible with a bowel obstruction. Electronically Signed   By: Markus Daft M.D.   On: 04/09/2018 09:02   [place] Ekg Site Rite  Result Date: 04/17/2018 If Site Rite image not attached, placement could not be confirmed due to current cardiac rhythm.      Subjective: Patient is feeling well, no nausea or vomiting, tolerating po well, no chest pain or dyspnea.   Discharge Exam: Vitals:   04/22/18 0504 04/22/18 0737  BP: 125/75 (!) 130/58  Pulse: 77 78  Resp: 16 16  Temp: 98 F (36.7 C) (!) 97.5 F (36.4 C)  SpO2: 97% 100%   Vitals:   04/21/18 1329 04/21/18 2016 04/22/18 0504 04/22/18 0737  BP: (!) 142/82 (!) 173/76 125/75 (!) 130/58  Pulse: 82 84 77 78  Resp: 20 15 16 16   Temp: 98.5 F (36.9 C) 97.9 F (36.6 C) 98 F (36.7 C) (!) 97.5 F (36.4 C)  TempSrc: Oral Oral Oral Oral  SpO2: 100% 99% 97% 100%  Weight:      Height:        General: Not in pain or dyspnea, deconditioned  Neurology: Awake and alert, non focal  E ENT: mild pallor, no icterus, oral mucosa moist Cardiovascular: No JVD. S1-S2 present,  rhythmic, no gallops or rubs. Positive systolic murmur 3/6 at the left sternal border. No lower extremity edema. Pulmonary: vesicular breath sounds bilaterally, adequate air movement, no wheezing, rhonchi or rales. Gastrointestinal. Abdomen with no organomegaly, non tender, no rebound or guarding. Midline surgical wound in place.  Skin. No rashes Musculoskeletal: no joint deformities   The results of significant diagnostics from this hospitalization (including imaging,  microbiology, ancillary and laboratory) are listed below for reference.     Microbiology: Recent Results (from the past 240 hour(s))  MRSA PCR Screening     Status: None   Collection Time: 04/16/18  2:55 PM  Result Value Ref Range Status   MRSA by PCR NEGATIVE NEGATIVE Final    Comment:        The GeneXpert MRSA Assay (FDA approved for NASAL specimens only), is one component of a comprehensive MRSA colonization surveillance program. It is not intended to diagnose MRSA infection nor to guide or monitor treatment for MRSA infections. Performed at [facility], [place] [address] [place], [place] [address]      Labs: BNP (last 3 results) No results for input(s): BNP in the last 8760 hours. Basic Metabolic Panel: Recent Labs  Lab 04/18/18 0800 04/19/18 0318 04/19/18 0645 04/20/18 0517 04/21/18 0410  NA 137 134* 138 140 140  K 3.9 5.3* 4.1 3.8 3.7  CL 112* 112* 112* 112* 113*  CO2 21* 21* 21* 23 23  GLUCOSE 78 382* 110* 99 85  BUN 17 14 14 13 12   CREATININE 0.87 0.84 0.85 0.86 0.75  CALCIUM 8.5* 8.1* 8.7* 9.0 8.8*   Liver Function Tests: No results for input(s): AST, ALT, ALKPHOS, BILITOT, PROT, ALBUMIN in the last 168 hours. No results for input(s): LIPASE, AMYLASE in the last 168 hours. No results for input(s): AMMONIA in the last 168 hours. CBC: Recent Labs  Lab 04/17/18 0617 04/18/18 0313 04/19/18 0318 04/20/18 0517 04/21/18 0410  WBC 8.1 5.5 4.4 3.5* 3.0*  NEUTROABS 6.5 4.5 3.3 2.3 1.6*  HGB 9.4* 9.1* 9.0* 10.1* 9.9*  HCT 29.4* 27.6* 27.1* 29.6* 29.7*  MCV 88.6 89.0 87.7 85.8 88.7  PLT 159 124* 143* 166 179   Cardiac Enzymes: No results for input(s): CKTOTAL, CKMB, CKMBINDEX, TROPONINI in the last 168 hours. BNP: Invalid input(s): POCBNP CBG: Recent Labs  Lab 04/21/18 1203 04/21/18 1727 04/22/18 0000 04/22/18 0046 04/22/18 0643  GLUCAP 81 80 62* 82 85   D-Dimer No results for input(s): DDIMER in the last 72 hours. Hgb  A1c No results for input(s): HGBA1C in the last 72 hours. Lipid Profile No results for input(s): CHOL, HDL, LDLCALC, TRIG, CHOLHDL, LDLDIRECT in the last 72 hours. Thyroid function studies No results for input(s): TSH, T4TOTAL, T3FREE, THYROIDAB in the last 72 hours.  Invalid input(s): FREET3 Anemia work up No results for input(s): VITAMINB12, FOLATE, FERRITIN, TIBC, IRON, RETICCTPCT in the last 72 hours. Urinalysis    Component Value Date/Time   COLORURINE YELLOW 04/08/2018 1805   APPEARANCEUR CLEAR 04/08/2018 1805   LABSPEC 1.027 04/08/2018 1805   PHURINE 5.0 04/08/2018 1805   GLUCOSEU NEGATIVE 04/08/2018 1805   HGBUR NEGATIVE 04/08/2018 1805   BILIRUBINUR NEGATIVE 04/08/2018 1805   KETONESUR NEGATIVE 04/08/2018 1805   PROTEINUR NEGATIVE 04/08/2018 1805   UROBILINOGEN 0.2 05/17/2015 2307   NITRITE NEGATIVE 04/08/2018 1805   LEUKOCYTESUR NEGATIVE 04/08/2018 1805   Sepsis Labs Invalid input(s): PROCALCITONIN,  WBC,  LACTICIDVEN Microbiology Recent Results (from the past 240 hour(s))  MRSA PCR Screening  Status: None   Collection Time: 04/16/18  2:55 PM  Result Value Ref Range Status   MRSA by PCR NEGATIVE NEGATIVE Final    Comment:        The GeneXpert MRSA Assay (FDA approved for NASAL specimens only), is one component of a comprehensive MRSA colonization surveillance program. It is not intended to diagnose MRSA infection nor to guide or monitor treatment for MRSA infections. Performed at [facility], [place] [address] [place],  [address]      Time coordinating discharge: 45 minutes  SIGNED:   Tawni Millers, MD  [facility] 04/22/2018, 8:57 AM Pager [phone]  If 7PM-7AM, please contact night-coverage [email or web address] Password [email or web address]

## 2018-04-22 NOTE — Progress Notes (Signed)
[facility] Progress Note  6 Days Post-Op  Subjective: CC:  No new complaints. Denies pain. Having bowel function. Appetite returning. Again tells [place] that at baseline she was using a wheelchair at home and was able to get around her home with ease.   Objective: Vital signs in last 24 hours: Temp:  [97.5 F (36.4 C)-98.5 F (36.9 C)] 97.5 F (36.4 C) (06/21 0737) Pulse Rate:  [77-84] 78 (06/21 0737) Resp:  [15-20] 16 (06/21 0737) BP: (125-173)/(58-82) 130/58 (06/21 0737) SpO2:  [97 %-100 %] 100 % (06/21 0737) Last BM Date: 04/22/18  Intake/Output from previous day: 06/20 0701 - 06/21 0700 In: 1110 [P.O.:460; I.V.:650] Out: -  Intake/Output this shift: Total I/O In: 60 [P.O.:60] Out: -   PE: Gen:  Alert, NAD, resting comfortably in bed  Pulm:  Normal effort Abd: Soft, appropriately tender around surgical incisions, +BS, incision c/d/i Skin: warm and dry, no rashes  Psych: A&Ox3    Lab Results:  Recent Labs    04/20/18 0517 04/21/18 0410  WBC 3.5* 3.0*  HGB 10.1* 9.9*  HCT 29.6* 29.7*  PLT 166 179   BMET Recent Labs    04/20/18 0517 04/21/18 0410  NA 140 140  K 3.8 3.7  CL 112* 113*  CO2 23 23  GLUCOSE 99 85  BUN 13 12  CREATININE 0.86 0.75  CALCIUM 9.0 8.8*   PT/INR No results for input(s): LABPROT, INR in the last 72 hours. CMP     Component Value Date/Time   NA 140 04/21/2018 0410   NA 135 12/07/2017 1427   NA 141 02/19/2017 1111   K 3.7 04/21/2018 0410   K 4.3 02/19/2017 1111   CL 113 (H) 04/21/2018 0410   CO2 23 04/21/2018 0410   CO2 26 02/19/2017 1111   GLUCOSE 85 04/21/2018 0410   GLUCOSE 72 02/19/2017 1111   BUN 12 04/21/2018 0410   BUN 20 12/07/2017 1427   BUN 16.7 02/19/2017 1111   CREATININE 0.75 04/21/2018 0410   CREATININE 0.9 02/19/2017 1111   CALCIUM 8.8 (L) 04/21/2018 0410   CALCIUM 10.2 02/19/2017 1111   PROT 6.4 (L) 04/13/2018 0435   PROT 7.3 08/23/2017 1542   PROT 7.5 02/19/2017 1111   ALBUMIN 3.5  04/13/2018 0435   ALBUMIN 4.1 08/23/2017 1542   ALBUMIN 3.8 02/19/2017 1111   AST 22 04/13/2018 0435   AST 14 02/19/2017 1111   ALT 15 04/13/2018 0435   ALT 8 02/19/2017 1111   ALKPHOS 45 04/13/2018 0435   ALKPHOS 92 02/19/2017 1111   BILITOT 0.5 04/13/2018 0435   BILITOT 0.3 08/23/2017 1542   BILITOT 0.36 02/19/2017 1111   GFRNONAA >60 04/21/2018 0410   GFRAA >60 04/21/2018 0410   Lipase     Component Value Date/Time   LIPASE 47 04/08/2018 1430       Studies/Results: No results found.  Anti-infectives: Anti-infectives (From admission, onward)   Start     Dose/Rate Route Frequency Ordered Stop   04/16/18 1030  ertapenem (INVANZ) 1 g in sodium chloride 0.9 % 100 mL IVPB     1 g 200 mL/hr over 30 Minutes Intravenous On call to O.R. 04/16/18 1016 04/16/18 2113   04/08/18 1715  vancomycin (VANCOCIN) 1,250 mg in sodium chloride 0.9 % 250 mL IVPB  Status:  Discontinued     1,250 mg 166.7 mL/hr over 90 Minutes Intravenous  Once 04/08/18 1711 04/08/18 1732   04/08/18 1715  cefTRIAXone (ROCEPHIN) 1 g in sodium chloride  0.9 % 100 mL IVPB  Status:  Discontinued     1 g 200 mL/hr over 30 Minutes Intravenous  Once 04/08/18 1711 04/08/18 1732   04/08/18 1715  ampicillin (OMNIPEN) 1 g in sodium chloride 0.9 % 100 mL IVPB  Status:  Discontinued     1 g 300 mL/hr over 20 Minutes Intravenous  Once 04/08/18 1711 04/08/18 1728     Assessment/Plan Small cell lymphoma - axilla/CLL Hx of tobacco use Hx of hypertension/hypotension Hx of CVA Diabetes DJD/osteoarthritis  SBO 04/08/18 - Hx of Sigmoid colectomy 06/2016/Ventral hernia repair/LOA 04/2014 Lysis of adhesions 04/16/18, Dr. Armandina Gemma  -  POD#6 -  NG d/ced 6/18  -  having flatus and BMs -  tolerating SOFT diet - OOB to chair, PT/OT recommended SNF   FEN: SOFT  ID: None currently DVT: Lovenox Foley: In Follow up: Dr. Harlow Asa  Plan: stable for discharge to SNF. Will need staple removal 6/28. F/U w/ Dr. Harlow Asa in 2-3  weeks. Recommend dietary supplementation with ensure.     LOS: 14 days    Jill Alexanders , [facility] 04/22/2018, 9:28 AM Pager: [phone] Consults: [phone] Mon-Fri 7:00 am-4:30 pm Sat-Sun 7:00 am-11:30 am

## 2018-04-22 NOTE — Progress Notes (Signed)
CRITICAL VALUE ALERT  Critical Value:  54  Date & Time Notied: 04/22/2018 at 0001  Provider Notified: None  Orders Received/Actions taken: applied hypoglycemic protocol. Pt was given cup of apple juice and cup of ice cream. No any hypoglycemic symptoms.  CBG rechecked  82.  Will continue to monitor.

## 2018-04-22 NOTE — Progress Notes (Signed)
Assumed care of this discharged patient waiting for [facility] to arrive to take her to [facility]. RN attempted to call report to SNF but there was no answer. Packet ready for transport. Donne Hazel, RN

## 2018-04-22 NOTE — Progress Notes (Signed)
Modified Barium Swallow Progress Note  Patient Details  Name: JAYSA KISE MRN: [id number] Date of Birth: 11-16-1929  Today's Date: 04/22/2018  Modified Barium Swallow completed.  Full report located under Chart Review in the Imaging Section.  Brief recommendations include the following:  Clinical Impression  MBS was completed using thin liquids, nectar thick liquids, honey thick liquids, pureed material and dual textured solids.  The patient presented with a moderate oral and mild pharyngeal dysphagia.  The oral phase was marked be decreased lingual control which led to decreased bolus cohesion and premature loss of the bolus across textures.  This allowed most material to fall into the pyriform sinuses prior to the swallow trigger.  The pharyngeal phase was marked by a swallow trigger with all liquids in the pyriform sinuses and purees/soft solids in the vallecula, decreased laryngeal elevation and excursion and decreased laryngeal vestibule closure.  Mildly reduced cricopharyngeal opening was also seen but not causing any functional issues.  Flash penetration was seen prior to the swallow given spoon sips of thin liquids.  Penetration into the laryngeal vestibule was seen prior to and during the swallow given thin by cup, nectar thick liquids by spoon and cup and honey thick liquids by cup.  With multiple subsequent swallow the patient cleared material from back of epiglottis.  No penetration/aspiration was seen given purees or soft solids.  Recommend continue with a mechanical soft diet with thin liquids.  Oral care BEFORE all meals should be completed to mitigate risks from any possible aspiration.  The patient will require FULL supervision for all intake to control rate of intake.  She would benefit from swallowing therapy at the next level of care.  ST will follow during acute stay.     Swallow Evaluation Recommendations       SLP Diet Recommendations: Dysphagia 3 (Mech soft) solids;Thin  liquid   Liquid Administration via: Cup   Medication Administration: Crushed with puree   Supervision: Full assist for feeding   Compensations: Slow rate;Small sips/bites   Postural Changes: Remain semi-upright after after feeds/meals (Comment);Seated upright at 90 degrees   Oral Care Recommendations: Oral care before and after [place], MA, CCC-SLP Acute Rehab SLP [phone]  Lamar Sprinkles 04/22/2018,3:55 PM

## 2018-04-22 NOTE — Progress Notes (Signed)
Pt choked on a bite of her breakfast this morning.  Started coughing and presenting with a wet, congested cough. Lungs initially clear upon auscultation, but then presented with Rhonchi bilaterally.  MD made aware.  Chest xray and swallow eval ordered. RN will monitor.

## 2018-05-23 ENCOUNTER — Encounter: Payer: Self-pay | Admitting: Internal Medicine

## 2018-05-23 ENCOUNTER — Telehealth: Payer: Self-pay | Admitting: Internal Medicine

## 2018-05-23 ENCOUNTER — Other Ambulatory Visit (INDEPENDENT_AMBULATORY_CARE_PROVIDER_SITE_OTHER): Payer: Medicare Other | Admitting: Internal Medicine

## 2018-05-23 VITALS — BP 154/70 | HR 80 | Resp 16 | Wt 162.0 lb

## 2018-05-23 DIAGNOSIS — K56609 Unspecified intestinal obstruction, unspecified as to partial versus complete obstruction: Secondary | ICD-10-CM

## 2018-05-23 DIAGNOSIS — Z789 Other specified health status: Secondary | ICD-10-CM

## 2018-05-23 DIAGNOSIS — I10 Essential (primary) hypertension: Secondary | ICD-10-CM

## 2018-05-23 DIAGNOSIS — R600 Localized edema: Secondary | ICD-10-CM

## 2018-05-23 DIAGNOSIS — Z7189 Other specified counseling: Secondary | ICD-10-CM

## 2018-05-23 DIAGNOSIS — R2681 Unsteadiness on feet: Secondary | ICD-10-CM

## 2018-05-23 NOTE — Telephone Encounter (Signed)
Paxton at [phone] and was sent to voice mail for discharge planner, [place].   Could only leave a message. Asked for her discharge information and what was supposed to have happened at discharge.   Called and spoke with Mr. Cagley.  She apparently did not want any rehab.   She may be a candidate for PACE, but for now needs home health care.

## 2018-05-23 NOTE — Progress Notes (Addendum)
Subjective:    Patient ID: Erika Day, female    DOB: 29-Jul-1930, 84 y.o.   MRN: [id number]  HPI   Received call from [name], previous CHW here in [facility]. Stated [name] had been discharged 3 days ago, Friday, July 19th from SNF at [facility] and did not have any medication list, did not have home nursing set up.  Was just sitting in wheelchair with a towel over her.  Called her phone and husband, [name], answered.  He had no knowledge of what follow up was planned.   Stated she was unable to perform any of her ADLs and unable to transfer from her wheelchair.   They, unfortunately had purchased a wheelchair that is too wide for the home and she is unable to get around.  [place] and unable to speak to discharge planner, [name], but left message we needed her discharge plan and instructions as well as current med list.    Performed home visit.  When arrived, [name], a home health aid was there.  She is reportedly taking over from [name], who quit.  She is through [facility] at [phone].  Her supervisor is [name] per [employer].  [name] stated she thought PT was set up at discharge and they were to visit later this week. Discussed the possibility of PACE referral.    Upon return, discharge information received by fax from [facility].  PT for gait and strengthening, QT for ADLs,  and nursing for medication Management already ordered and to be set up through [facility]. Called the number and spoke with [name], who shared that a nurse had already visited [name] [facility] [name] yesterday.  They did not have the discharge information from [facility].  I gave them her up to date medication list.  [name] recommended setting her up for [facility].    Current Meds  Medication Sig  . acetaminophen (TYLENOL) 325 MG tablet Take 2 tablets (650 mg total) by mouth every 4 (four) hours as needed for  mild pain, moderate pain, fever or headache.  Marland Kitchen amLODipine (NORVASC) 5 MG tablet Take 5 mg by mouth daily.  . cetirizine (ZYRTEC) 10 MG tablet 1 tab by mouth daily for allergy symptoms  . Cholecalciferol (VITAMIN D3) 50000 units TABS Take 1 tablet by mouth once a week. Every Saturday  . dicyclomine (BENTYL) 10 MG capsule 1 cap by mouth every 6 hours as needed for abdominal cramping.  Marland Kitchen lisinopril (PRINIVIL,ZESTRIL) 20 MG tablet Take 1 tablet (20 mg total) by mouth daily.  Marland Kitchen LOTEMAX 0.5 % ophthalmic suspension Place 1 drop into the left eye daily.  . mirtazapine (REMERON) 7.5 MG tablet Take 7.5 mg by mouth at bedtime.  . ondansetron (ZOFRAN) 4 MG tablet 1-2 by mouth every 8 hours as needed for nausea and vomiting.  . pantoprazole (PROTONIX) 40 MG tablet 1 tab by mouth daily 1/2 hour before breakfast on an empty stomach  . polyethylene glycol powder (GLYCOLAX/MIRALAX) powder MIX 17 g IN 8 OZ OF WATER/JUICE D  . senna-docusate (SENOKOT-S) 8.6-50 MG tablet Take 2 tablets by mouth at bedtime as needed for mild constipation.   Allergies  Allergen Reactions  . Morphine And Related Other (See Comments)    Blood sugar dropped one-time       Review of Systems     Objective:   Physical Exam  NAD, alert and speaks easily and clearly. Bent forward  in wheelchair, though able to sit more upright if asked Lungs:  CTA CV:  RRR with Grade II-III/VI SEM.  Radial and DP pulses normal and equal LE with pitting edema, mild on right, more pronounced on left, though not as significant as previous Abd:  S, NT, No HSM or mass, + BS      Assessment & Plan:  1.  Need for PT for strengthening and gait and transfers.  Would also like for them to determine what equipment would be best for her to physically navigate through the home--a narrower wheelchair with leg rests or a walker with a seat.    2.  Need for OT for ADLs.  We will be also speaking with [facility] and looking for [employer] coverage  through Medicaid --which am expecting she already has through [facility]. Was recommended for her to have grab bars, elevated toilet seat.  She has a commode.  unpadded shower bench and over the tub.  Long handled sponge   Assistance with ADLs and 24 hour care.  3.  Need for nursing for medication management and aid as above for ADLs.   Finally able to contact [facility], [name] and [name].  [name] has been with them for some time, prior to [employer] providing primary care.   [name] is indeed to help both [name] and her husband [name] with ADLs and covered by personal care service through [facility].   Monday:  3 hours Tues through Saturday:  2.5 hours. Fax is [phone]  4.  LE edema:  Low sodium diet, elevation of legs discussed with aid and [name] from [facility] today.  Needs leg supports for wheelchair as well.  5.  Depression/lack of appetite.  Will Continue with Remeron 7.5 mg for now, but need to pay attention to weight gain as has had difficulties with DM in past.  6.  Essential Hypertension:  Up a bit today, but reportedly has not taken medication in past 3 days.  Would like to hold on Amlodipine with her edema and previous good control with Lisinopril and morning Furosemide 20 mg in the morning.  Called and spoke with [name] at [place].    Looking with [facility] to see if we can get her on [employer] [facility] Program waiting list. [name], SW from [place] will go out and do an assessment.  [name] and [name] from [place] will contact us for [employer] on coverage with Medicare and Medicaid for CAP and Personal Assistance Programs and criteria to meet for them   Notified [facility].

## 2018-05-23 NOTE — Telephone Encounter (Signed)
Spoke with [facility]. Referral faxed to them they will review referral and contact either [place] or patient within 24 hours if patient qualifies

## 2018-05-25 ENCOUNTER — Telehealth: Payer: Self-pay | Admitting: Internal Medicine

## 2018-05-25 MED ORDER — CETIRIZINE HCL 10 MG PO TABS: ORAL_TABLET | ORAL | 3 refills | Status: DC

## 2018-05-25 MED ORDER — LISINOPRIL 20 MG PO TABS: 20.0000 mg | ORAL_TABLET | Freq: Every day | ORAL | 3 refills | Status: DC

## 2018-05-25 MED ORDER — VITAMIN D3 1.25 MG (50000 UT) PO TABS: 1.0000 | ORAL_TABLET | ORAL | 0 refills | Status: DC

## 2018-05-25 MED ORDER — LOTEMAX 0.5 % OP SUSP: 1.0000 [drp] | Freq: Every day | OPHTHALMIC | 11 refills | Status: DC

## 2018-05-25 MED ORDER — POLYETHYLENE GLYCOL 3350 17 GM/SCOOP PO POWD: ORAL | 11 refills | Status: DC

## 2018-05-25 MED ORDER — PANTOPRAZOLE SODIUM 40 MG PO TBEC: DELAYED_RELEASE_TABLET | ORAL | 3 refills | Status: DC

## 2018-05-25 MED ORDER — MIRTAZAPINE 7.5 MG PO TABS: 7.5000 mg | ORAL_TABLET | Freq: Every day | ORAL | 4 refills | Status: DC

## 2018-05-25 MED ORDER — SENNOSIDES-DOCUSATE SODIUM 8.6-50 MG PO TABS: 2.0000 | ORAL_TABLET | Freq: Every evening | ORAL | 1 refills | Status: DC | PRN

## 2018-05-25 MED ORDER — DICYCLOMINE HCL 10 MG PO CAPS: ORAL_CAPSULE | ORAL | 2 refills | Status: DC

## 2018-05-25 MED ORDER — ONDANSETRON HCL 4 MG PO TABS: ORAL_TABLET | ORAL | 0 refills | Status: DC

## 2018-05-25 MED ORDER — FUROSEMIDE 20 MG PO TABS: ORAL_TABLET | ORAL | 3 refills | Status: DC

## 2018-05-25 NOTE — Telephone Encounter (Signed)
Went through [employer] obtained from SNF:  Discussed which ones we are continuing and which we are not.  Have already had discussion with nursing from [place], [place] Will restart all meds with new Rxs at [facility]

## 2018-05-25 NOTE — Telephone Encounter (Signed)
Patient's daughter [name] call requesting a call back from Dr. Amil Amen to get clarification on  Ms. Pettey medications.  [name] can be reach at [phone].  Please advise.

## 2018-05-25 NOTE — Telephone Encounter (Signed)
To Dr. Mulberry for further direction 

## 2018-05-30 ENCOUNTER — Telehealth: Payer: Self-pay | Admitting: Internal Medicine

## 2018-05-30 NOTE — Telephone Encounter (Signed)
To Dr. Mulberry for FYI 

## 2018-05-30 NOTE — Telephone Encounter (Signed)
Spoke with Erika Day. States she was just letting us know that she will not see [name] until August. States she does not need another order since one was already given. She was just simply stating the family did not want her to come until August.

## 2018-05-30 NOTE — Telephone Encounter (Signed)
I'm not sure what the question is. I did give a voice order for social work evaluation--to see if she meets criteria for services through [facility].  Are they asking for my signature or what is the question?   Do they need to speak to me? If Erika Day, you would give her a call and see what she would like to dicuss.

## 2018-05-30 NOTE — Telephone Encounter (Signed)
Call from Jon Billings, Social Worker with [place].  [phone]  I received social work order, family requested she visit August 1. 2019

## 2018-07-13 ENCOUNTER — Ambulatory Visit: Payer: Medicare Other | Admitting: Internal Medicine

## 2018-07-18 ENCOUNTER — Ambulatory Visit: Payer: Medicare Other | Admitting: Internal Medicine

## 2018-08-02 ENCOUNTER — Encounter: Payer: Self-pay | Admitting: Internal Medicine

## 2018-08-02 ENCOUNTER — Ambulatory Visit (INDEPENDENT_AMBULATORY_CARE_PROVIDER_SITE_OTHER): Payer: Medicare Other | Admitting: Internal Medicine

## 2018-08-02 VITALS — BP 130/80 | HR 60 | Resp 12

## 2018-08-02 DIAGNOSIS — R600 Localized edema: Secondary | ICD-10-CM

## 2018-08-02 DIAGNOSIS — M25511 Pain in right shoulder: Secondary | ICD-10-CM

## 2018-08-02 DIAGNOSIS — K219 Gastro-esophageal reflux disease without esophagitis: Secondary | ICD-10-CM

## 2018-08-02 DIAGNOSIS — I10 Essential (primary) hypertension: Secondary | ICD-10-CM | POA: Diagnosis not present

## 2018-08-02 DIAGNOSIS — Z79899 Other long term (current) drug therapy: Secondary | ICD-10-CM

## 2018-08-02 DIAGNOSIS — G8929 Other chronic pain: Secondary | ICD-10-CM

## 2018-08-02 DIAGNOSIS — R630 Anorexia: Secondary | ICD-10-CM

## 2018-08-02 DIAGNOSIS — R109 Unspecified abdominal pain: Secondary | ICD-10-CM | POA: Diagnosis not present

## 2018-08-02 DIAGNOSIS — M25562 Pain in left knee: Secondary | ICD-10-CM

## 2018-08-02 DIAGNOSIS — D649 Anemia, unspecified: Secondary | ICD-10-CM

## 2018-08-02 MED ORDER — TRAMADOL HCL 50 MG PO TABS: 50.0000 mg | ORAL_TABLET | Freq: Three times a day (TID) | ORAL | 0 refills | Status: DC | PRN

## 2018-08-02 NOTE — Progress Notes (Signed)
Subjective:    Patient ID: Erika Day, female    DOB: 01/23/1930, 84 y.o.   MRN: [id number]  HPI   1.  Having pain from right AC joint.  Sleeps on that side as well and difficulties with pain keeping her awake at times.  Patient with kyphosis and holds shoulders up high.   "looks like the bone is coming out of my skin" She throws her legs over the edge of the bed when her shoulder starts with increased pain.  She has not had Tramadol in some time for when her pain is not controlled  2. Left knee pain:  Popping with pain at times.  Has been working with Jeneen Rinks from [place], likely OT, who she states wants her to stay within her walker and get her a wheeled walker.  This has helped her knee and she is getting up more to walk.   Her thigh high compression stockings help with the knee pain as well.    3.  Depression/lack of appetite:  She has a great appetite, even getting up in middle of night to eat.  She is not taking the mirtazepine.  She does not feel depressed.  4.  Hypertension : taking Lisinopril and Lasix.  Legs without much swelling with lasix, keeping legs up and compression stockings.  Not necessarily watching sodium in her diet.  Her home health aid is trying to keep sodium down in diet.    5.  Constipation:  Not a problem.  Home health aid has her diet improved to keep her regular.  She is not needing Miralax.   She also feels she is much more physically active and feels this is the reason for her improved constipation.  6.  Abdominal cramping:  Not needing to use the Dicyclomine as often.   7.  Dysphagia:  Sometimes.   Using Protonix it sounds like only as needed.    Current Meds  Medication Sig  . acetaminophen (TYLENOL) 325 MG tablet Take 2 tablets (650 mg total) by mouth every 4 (four) hours as needed for mild pain, moderate pain, fever or headache.  . cetirizine (ZYRTEC) 10 MG tablet 1 tab by mouth daily for allergy symptoms  . dicyclomine (BENTYL) 10 MG capsule 1 cap  by mouth every 6 hours as needed for abdominal cramping.  Water engineer Bandages & Supports (MEDICAL COMPRESSION STOCKINGS) MISC 20-30 mm Hg with toe thigh high graduated compression stockings on every morning and off at night.  . furosemide (LASIX) 20 MG tablet 1 tab by mouth once daily in morning  . lisinopril (PRINIVIL,ZESTRIL) 20 MG tablet Take 1 tablet (20 mg total) by mouth daily.  Marland Kitchen LOTEMAX 0.5 % ophthalmic suspension Place 1 drop into the left eye daily.  . pantoprazole (PROTONIX) 40 MG tablet 1 tab by mouth daily 1/2 hour before breakfast on an empty stomach  . polyethylene glycol powder (GLYCOLAX/MIRALAX) powder Mix 17 g powder in 8 oz of water and drink once daily (Patient not taking: Reported on 10/19/2018)  . senna-docusate (SENOKOT-S) 8.6-50 MG tablet Take 2 tablets by mouth at bedtime as needed for mild constipation. (Patient not taking: Reported on 10/19/2018)   Allergies  Allergen Reactions  . Morphine And Related Other (See Comments)    Blood sugar dropped one-time     Review of Systems     Objective:   Physical Exam   NAD Lungs:  CTA CV:  RRR, radial pulses normal and equal LE:  Mild pitting edema  bilaterally, left greater than right. MS:  Holding shoulders high, AC joint less accentuated when relaxes shoulders down.  Mildly tender with hypertrophy of right AC joint.  NT over CC joint.  NT subacromial bursa area.  Increased pain when reaches across to left shoulder with right upper arm.   Left knee:  Hypertrophic change.  Good ROM.  No palpable effusion or joint line tenderness.  NT with posterior compression of patella.  Unable to adequately assess ligamentous stability as in wheelchair today.         Assessment & Plan:  1.  HM:  Needs influenza vaccine--will check with [facility]  If not, when we get ours in, will bring to her home and get both Is[place] and her husband.  2.  LE edema:  Controlled.  Need to get her back to keeping legs elevated.  Suspect she is still  eating relatively high sodium diet. Continue with compression stockings.  She states she is using, but often not on when see her for home visits.  3.  Hypertension:  Controlled.  CMP  4.  Dysphagia/GERD:  Have checked meds and appears despite her home health, she continues with difficulties taking meds regularly.  Have encouraged her to take Pantoprazole nightly on empty stomach.  5.  Right AC joint and left knee pain:  Tramadol refilled to take as needed when significant pain.  Will see if can get [facility] PT through [facility] out to her home.  6.  Previously poor appetite and concern for depression:  This appears resolved.  Remove Mirtazepine from med list.  7.  Anemia:  CBC  8.  Abdominal cramping/constipation:  Improved with increased mobility.

## 2018-08-02 NOTE — Patient Instructions (Signed)
Only take 1/2 of the 50 mg of Tramadol--mainly at night.  I can only prescribe 5 days worth of medication to start with--next prescription I can fill more.

## 2018-08-03 LAB — CBC WITH DIFFERENTIAL/PLATELET
BASOS: 1 %
Basophils Absolute: 0 10*3/uL (ref 0.0–0.2)
EOS (ABSOLUTE): 0.1 10*3/uL (ref 0.0–0.4)
Eos: 2 %
HEMATOCRIT: 33.5 % — AB (ref 34.0–46.6)
Hemoglobin: 10.9 g/dL — ABNORMAL LOW (ref 11.1–15.9)
Immature Grans (Abs): 0 10*3/uL (ref 0.0–0.1)
Immature Granulocytes: 0 %
LYMPHS ABS: 1.3 10*3/uL (ref 0.7–3.1)
Lymphs: 49 %
MCH: 27 pg (ref 26.6–33.0)
MCHC: 32.5 g/dL (ref 31.5–35.7)
MCV: 83 fL (ref 79–97)
MONOS ABS: 0.3 10*3/uL (ref 0.1–0.9)
Monocytes: 12 %
NEUTROS PCT: 36 %
Neutrophils Absolute: 1 10*3/uL — ABNORMAL LOW (ref 1.4–7.0)
Platelets: 181 10*3/uL (ref 150–450)
RBC: 4.03 x10E6/uL (ref 3.77–5.28)
RDW: 13.2 % (ref 12.3–15.4)
WBC: 2.7 10*3/uL — AB (ref 3.4–10.8)

## 2018-08-03 LAB — COMPREHENSIVE METABOLIC PANEL
ALBUMIN: 4 g/dL (ref 3.5–4.7)
ALK PHOS: 80 IU/L (ref 39–117)
ALT: 9 IU/L (ref 0–32)
AST: 16 IU/L (ref 0–40)
Albumin/Globulin Ratio: 1.2 (ref 1.2–2.2)
BILIRUBIN TOTAL: 0.3 mg/dL (ref 0.0–1.2)
BUN/Creatinine Ratio: 12 (ref 12–28)
BUN: 11 mg/dL (ref 8–27)
CHLORIDE: 96 mmol/L (ref 96–106)
CO2: 20 mmol/L (ref 20–29)
CREATININE: 0.94 mg/dL (ref 0.57–1.00)
Calcium: 10.1 mg/dL (ref 8.7–10.3)
GFR calc Af Amer: 64 mL/min/{1.73_m2} (ref >59)
GFR calc non Af Amer: 55 mL/min/{1.73_m2} — ABNORMAL LOW (ref >59)
GLOBULIN, TOTAL: 3.4 g/dL (ref 1.5–4.5)
Glucose: 70 mg/dL (ref 65–99)
POTASSIUM: 4.4 mmol/L (ref 3.5–5.2)
Sodium: 134 mmol/L (ref 134–144)
Total Protein: 7.4 g/dL (ref 6.0–8.5)

## 2018-09-02 ENCOUNTER — Encounter: Payer: Self-pay | Admitting: Internal Medicine

## 2018-09-02 ENCOUNTER — Other Ambulatory Visit (INDEPENDENT_AMBULATORY_CARE_PROVIDER_SITE_OTHER): Payer: Medicare Other | Admitting: Internal Medicine

## 2018-09-02 VITALS — BP 100/50 | HR 84 | Resp 20

## 2018-09-02 DIAGNOSIS — B349 Viral infection, unspecified: Secondary | ICD-10-CM

## 2018-09-02 NOTE — Progress Notes (Signed)
Subjective:    Patient ID: Erika Day, female    DOB: 1930/05/14, 84 y.o.   MRN: [id number]  HPI  Home Visit:  Car not working to come to clinic.  Received call this morning from aide, I believe Miss Erika Day from [facility]. She stated Erika Day was having a fever and cough and chest pain. Arranged for home visit after speaking to Erika Day, though apparently EMS had been called to the home in the meantime as Erika Day read her thermometer just prior to my call as having a fever of 110 F.   EMS arrived, her temp was actually 101, she was assessed and decision was to not go to ED.  This was about 8:30 a.m today.  Erika Day states has had sneezing and coughing a bit since beginning of week, 5 days ago.   Last night she started gagging on mucous with cough, thought she would vomit, but did not. Cough productive of clear mucous.  Nasal discharge is light yellow.  Had mild difficulty breathing earlier when temp was up--they gave her 650 mg of Tylenol. Decreased appetite for past week and not drinking much as well, though feels she has been urinating okay. No nausea or diarrhea.   No light headedness. Swelling of legs is stable Denies chest pain--earlier felt discomfort like she needed to belch and indeed did so, which relieved the pain.  Neither she nor her husband have received the flu vaccine yet.  We had asked the to come to the health fair or to orange card sign ups the last two months.   Current Meds  Medication Sig  . acetaminophen (TYLENOL) 325 MG tablet Take 2 tablets (650 mg total) by mouth every 4 (four) hours as needed for mild pain, moderate pain, fever or headache.  . furosemide (LASIX) 20 MG tablet 1 tab by mouth once daily in morning  . lisinopril (PRINIVIL,ZESTRIL) 20 MG tablet Take 1 tablet (20 mg total) by mouth daily.  . pantoprazole (PROTONIX) 40 MG tablet 1 tab by mouth daily 1/2 hour before breakfast on an empty stomach    Allergies  Allergen Reactions  . Morphine  And Related Other (See Comments)    Blood sugar dropped one-time     Review of Systems     Objective:   Physical Exam Occasional congested cough NAD HEENT:  PERRL, EOMI, TMs dull, but without erythema.  MMM, but currently drinking soda.  Throat without injection Neck: Supple, No adenopathy Chest:  CTA CV:  RRR with Grade III/VI  SEM, radial and DP pulses normal and equal Abd:  S, NT, No HSM or mass, + BS LE:  Left greater than right pitting edema--mild for patient to mid pretibial areas       Assessment & Plan:  Viral syndrome/URI:  Symptomatic care.  Will need to ask [facility] if can give flu vaccine when she is better. Tylenol 650 mg as needed every 4 hours.   Has zyrtec 10 mg to take daily. May use Coricidin HBP also if needed. Push fluids--discussed that's what is most important to keep secretions thin for now. Call if worsening or go to ED. Hold Furosemide the next 2 days as appears a bit dehydrated.  Later in day:  Able to get hold of daughter, Erika Day, who knows her meds and will take care of the Furosemide. Also, asked Erika Day to stop smoking in the house and to keep the heat down as way to hot in there  for easy breathing today.  Erika Day will help with this as well.

## 2018-09-07 ENCOUNTER — Telehealth: Payer: Self-pay | Admitting: Hematology and Oncology

## 2018-09-07 NOTE — Telephone Encounter (Signed)
NG out 11/11 - moved f/u to [facility]. Spoke with patient re change and new time for 11/11.

## 2018-09-11 ENCOUNTER — Other Ambulatory Visit: Payer: Self-pay | Admitting: Hematology and Oncology

## 2018-09-11 DIAGNOSIS — D472 Monoclonal gammopathy: Secondary | ICD-10-CM

## 2018-09-11 DIAGNOSIS — C8584 Other specified types of non-Hodgkin lymphoma, lymph nodes of axilla and upper limb: Secondary | ICD-10-CM

## 2018-09-12 ENCOUNTER — Other Ambulatory Visit: Payer: Medicare Other

## 2018-09-12 ENCOUNTER — Inpatient Hospital Stay: Payer: Medicare Other | Attending: Hematology and Oncology

## 2018-09-12 ENCOUNTER — Inpatient Hospital Stay (HOSPITAL_BASED_OUTPATIENT_CLINIC_OR_DEPARTMENT_OTHER): Payer: Medicare Other | Admitting: Hematology and Oncology

## 2018-09-12 ENCOUNTER — Telehealth: Payer: Self-pay | Admitting: Hematology and Oncology

## 2018-09-12 ENCOUNTER — Encounter: Payer: Self-pay | Admitting: Hematology and Oncology

## 2018-09-12 ENCOUNTER — Ambulatory Visit: Payer: Medicare Other | Admitting: Hematology and Oncology

## 2018-09-12 VITALS — BP 143/60 | HR 62 | Temp 98.1°F | Resp 18 | Ht 65.0 in

## 2018-09-12 DIAGNOSIS — R05 Cough: Secondary | ICD-10-CM

## 2018-09-12 DIAGNOSIS — D472 Monoclonal gammopathy: Secondary | ICD-10-CM | POA: Diagnosis not present

## 2018-09-12 DIAGNOSIS — C8584 Other specified types of non-Hodgkin lymphoma, lymph nodes of axilla and upper limb: Secondary | ICD-10-CM | POA: Diagnosis not present

## 2018-09-12 DIAGNOSIS — E46 Unspecified protein-calorie malnutrition: Secondary | ICD-10-CM

## 2018-09-12 DIAGNOSIS — R200 Anesthesia of skin: Secondary | ICD-10-CM | POA: Diagnosis not present

## 2018-09-12 DIAGNOSIS — Z993 Dependence on wheelchair: Secondary | ICD-10-CM | POA: Insufficient documentation

## 2018-09-12 DIAGNOSIS — D61818 Other pancytopenia: Secondary | ICD-10-CM | POA: Diagnosis not present

## 2018-09-12 DIAGNOSIS — R202 Paresthesia of skin: Secondary | ICD-10-CM

## 2018-09-12 DIAGNOSIS — Z79899 Other long term (current) drug therapy: Secondary | ICD-10-CM | POA: Diagnosis not present

## 2018-09-12 LAB — COMPREHENSIVE METABOLIC PANEL
ALBUMIN: 3.7 g/dL (ref 3.5–5.0)
ALT: 8 U/L (ref 0–44)
AST: 14 U/L — ABNORMAL LOW (ref 15–41)
Alkaline Phosphatase: 73 U/L (ref 38–126)
Anion gap: 7 (ref 5–15)
BUN: 17 mg/dL (ref 8–23)
CALCIUM: 9.8 mg/dL (ref 8.9–10.3)
CHLORIDE: 106 mmol/L (ref 98–111)
CO2: 28 mmol/L (ref 22–32)
CREATININE: 1.14 mg/dL — AB (ref 0.44–1.00)
GFR calc non Af Amer: 42 mL/min — ABNORMAL LOW (ref >60)
GFR, EST AFRICAN AMERICAN: 49 mL/min — AB (ref >60)
GLUCOSE: 97 mg/dL (ref 70–99)
Potassium: 3.9 mmol/L (ref 3.5–5.1)
SODIUM: 141 mmol/L (ref 135–145)
Total Bilirubin: 0.4 mg/dL (ref 0.3–1.2)
Total Protein: 7.5 g/dL (ref 6.5–8.1)

## 2018-09-12 LAB — CBC WITH DIFFERENTIAL (CANCER CENTER ONLY)
ABS IMMATURE GRANULOCYTES: 0.01 10*3/uL (ref 0.00–0.07)
Basophils Absolute: 0 10*3/uL (ref 0.0–0.1)
Basophils Relative: 0 %
EOS PCT: 3 %
Eosinophils Absolute: 0.1 10*3/uL (ref 0.0–0.5)
HCT: 32.2 % — ABNORMAL LOW (ref 36.0–46.0)
HEMOGLOBIN: 10.3 g/dL — AB (ref 12.0–15.0)
IMMATURE GRANULOCYTES: 0 %
LYMPHS ABS: 1.3 10*3/uL (ref 0.7–4.0)
Lymphocytes Relative: 42 %
MCH: 28 pg (ref 26.0–34.0)
MCHC: 32 g/dL (ref 30.0–36.0)
MCV: 87.5 fL (ref 80.0–100.0)
MONO ABS: 0.2 10*3/uL (ref 0.1–1.0)
MONOS PCT: 8 %
NEUTROS ABS: 1.5 10*3/uL — AB (ref 1.7–7.7)
NEUTROS PCT: 47 %
NRBC: 0 % (ref 0.0–0.2)
PLATELETS: 151 10*3/uL (ref 150–400)
RBC: 3.68 MIL/uL — ABNORMAL LOW (ref 3.87–5.11)
RDW: 13.5 % (ref 11.5–15.5)
WBC Count: 3.1 10*3/uL — ABNORMAL LOW (ref 4.0–10.5)

## 2018-09-12 LAB — LACTATE DEHYDROGENASE: LDH: 162 U/L (ref 98–192)

## 2018-09-12 NOTE — Telephone Encounter (Signed)
Scheduled appt per 11/11 los - gave patient AVS and calender per los.   

## 2018-09-12 NOTE — Patient Instructions (Signed)
We discussed the results of your laboratory studies from October 1.  Your complete blood count is relatively stable.  Your hemoglobin has increased.  Your white blood cell count is only modestly decreased.  Protein studies were not obtained on October 1.  A follow-up appointment laboratory studies is now scheduled on December 30 Dr. Alvy Bimler.  Please do not hesitate to call in the interim should any new or untoward problems arise.

## 2018-09-12 NOTE — Progress Notes (Signed)
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 Alvy Bimler in 6 weeks. She reports no new problems or complaints. She is wheelchair-bound.   A home health aide comes into the home daily.  Other pancytopenia ([place]) Her laboratory studies from today are detailed below. They are relatively stable. She is undernourished but alert and coherent. She has had no infectious or bleeding complications. Given her advanced age, close observation was recommended. Leukopenia certainly could be constitutional; anemia of inflammation.  MGUS (monoclonal gammopathy of unknown significance) She has history of MGUS Follow-up laboratory studies and visit with Dr. Alvy Bimler was requested in 6 weeks. Repeat SPEP/IFX; kappa/lambda assay be performed at that time. Clinically, she is not symptomatic with no signs or symptoms of bone pain  No orders of the defined types were placed in this encounter.  INTERVAL HISTORY: Please see below for problem oriented charting. She returns for further follow-up Her appetite is stable. She has a cough with white productive phlegm in the morning. She denies recent infection She is frail Her intake is poor She denies recent weight loss The patient denies any recent bleeding such as spontaneous epistaxis, hematuria or hematochezia. She has intermittent tingling and numbness of her toes. She denies new lymphadenopathy.  SUMMARY OF ONCOLOGIC HISTORY:   Marginal zone lymphoma of axilla ([place])   05/18/2015 Imaging    Ct  abdomen and pelvis showed reactive lymphadenopathy and stool filled colon    02/13/2016 Imaging    Ct chest showed pneumonia    05/08/2016 Imaging    screening mammogram revealed right axillary lymphadenopathy    05/12/2016 Procedure    She underwent core needle biopsy of axillary LN    05/12/2016 Pathology Results    [phone] Biopsy showed low grade non-Hodgkin B cell lymphoma, likely marginal zone lymphoma    05/15/2016 Pathology Results    Peripheral blood positive for deletion 13q    05/25/2016 PET scan    Mildly hypermetabolic mediastinal, left axillary and right inguinal lymph nodes. 2. Mild hypermetabolism in the left thyroid, nonspecific    06/27/2016 Imaging    Ct abdomen showed sigmoid volvulus resulting in large bowel obstruction. Splenomegaly, in keeping with patient's history of lymphoma.     06/30/2016 Pathology Results    Accession: [id number] pathology specimen showed low grade lymphoma    06/30/2016 Surgery    She had LAPAROSCOPIC SIGMOID COLECTOMY,  Rigid sigmoidoscopy, with Primary repair recurrent incisional hernia       11/13/2016 PET scan    No evidence disease progression. 2. Mild decrease in metabolic activity in axillary lymph nodes. 3. Similar metabolic activity of small mediastinal lymph nodes [place] 3 . 4. Similar activity in inguinal lymph nodes. 5. No abnormal bowel activity identified     REVIEW OF SYSTEMS:   Constitutional: Denies fevers, chills or sweats. Eyes: Denies blurriness of vision Ears, nose, mouth, throat, and face: Denies mucositis or sore throat Respiratory: Denies cough, dyspnea or wheezes Cardiovascular: Denies palpitation, chest discomfort or lower extremity swelling Gastrointestinal:  Denies nausea, heartburn or change in bowel habits Skin: Denies abnormal skin rashes Lymphatics: Denies new lymphadenopathy or easy bruising  Neurological: Intermittent numbness and tingling of her toes; new weaknesses Behavioral/Psych: Mood is  stable, no new changes  All other systems were reviewed with the patient and are negative.  Past Medical History Reviewed        Family History Reviewed       Social History Reviewed  ALLERGIES:  is allergic to morphine and related.  MEDICATIONS:  Current Outpatient Medications  Medication Sig Dispense Refill  . acetaminophen (TYLENOL) 325 MG tablet Take 2 tablets (650 mg total) by mouth every 4 (four) hours as needed for mild pain, moderate pain, fever or headache. 20 tablet 0  . cetirizine (ZYRTEC) 10 MG tablet 1 tab by mouth daily for allergy symptoms 90 tablet 3  . dicyclomine (BENTYL) 10 MG capsule 1 cap by mouth every 6 hours as needed for abdominal cramping. 30 capsule 2  . furosemide (LASIX) 20 MG tablet 1 tab by mouth once daily in morning 90 tablet 3  . lisinopril (PRINIVIL,ZESTRIL) 20 MG tablet Take 1 tablet (20 mg total) by mouth daily. 90 tablet 3  . LOTEMAX 0.5 % ophthalmic suspension Place 1 drop into the left eye daily. 5 mL 11  . pantoprazole (PROTONIX) 40 MG tablet 1 tab by mouth daily 1/2 hour before breakfast on an empty stomach 90 tablet 3  . polyethylene glycol powder (GLYCOLAX/MIRALAX) powder Mix 17 g powder in 8 oz of water and drink once daily 578 g 11  . senna-docusate (SENOKOT-S) 8.6-50 MG tablet Take 2 tablets by mouth at bedtime as needed for mild constipation. 30 tablet 1  . traMADol (ULTRAM) 50 MG tablet Take 1 tablet (50 mg total) by mouth every 8 (eight) hours as needed. 15 tablet 0  . Cholecalciferol (VITAMIN D3) 50000 units TABS Take 1 tablet by mouth once a week. on Saturday (Patient not taking: Reported on 08/02/2018) 8 tablet 0  . Elastic Bandages & Supports (MEDICAL COMPRESSION STOCKINGS) MISC 20-30 mm Hg with toe thigh high graduated compression stockings on every morning and off at night. (Patient not taking: Reported on 09/02/2018) 3 each 1   No current facility-administered medications for this visit.     PHYSICAL EXAMINATION: ECOG PERFORMANCE  STATUS: 2 - Symptomatic, <50% confined to bed GENERAL:alert, no distress and comfortable.  She appears thin and frail, examined sitting on the wheelchair SKIN: skin color, texture, turgor are normal, no rashes or significant lesions EYES: normal, Conjunctiva are pink and non-injected, sclera clear OROPHARYNX:no exudate, no erythema and lips, buccal mucosa, and tongue normal  NECK: supple, thyroid normal size, non-tender, without nodularity LYMPH:  no palpable lymphadenopathy in the cervical, axillary or inguinal LUNGS: clear to auscultation and percussion with normal breathing effort HEART: regular rate & rhythm and no murmurs and no lower extremity edema ABDOMEN:abdomen soft, non-tender and normal bowel sounds Musculoskeletal:no cyanosis of digits and no clubbing  NEURO: alert & oriented x 3 with fluent speech, no focal motor/sensory deficits  LABORATORY DATA:  I have reviewed the data as listed August 02, 2018  Ref Range & Units 67mo ago (08/02/18) 56mo ago (04/21/18) 63mo ago (04/20/18) 59mo ago (04/19/18) 53mo ago (04/18/18)  WBC 3.4 - 10.8 x10E3/uL 2.7Low   3.0Low  R 3.5Low  R 4.4 R 5.5 R  RBC 3.77 - 5.28 x10E6/uL 4.03  3.35Low  R 3.45Low  R 3.09Low  R 3.10Low  R  Hemoglobin 11.1 - 15.9 g/dL 10.9Low   9.9Low  R 10.1Low  R 9.0Low  R 9.1Low  R  Hematocrit 34.0 - 46.6 %  33.5Low   29.7Low  R 29.6Low  R 27.1Low  R 27.6Low  R  MCV 79 - 97 fL 83  88.7 R 85.8 R 87.7 R 89.0 R  MCH 26.6 - 33.0 pg 27.0  29.6 R 29.3 R 29.1 R 29.4 R  MCHC 31.5 - 35.7 g/dL 32.5  33.3 R 34.1 R 33.2 R 33.0 R  RDW 12.3 - 15.4 % 13.2  13.9 R 13.6 R 13.6 R 13.6 R  Platelets 150 - 450 x10E3/uL 181  179 R 166 R 143Low  R 124Low  R  Neutrophils Not Estab. % 36  54 R 65 R 76 R 81 R  Lymphs Not Estab. % 49       Monocytes Not Estab. % 12       Eos Not Estab. % 2       Basos Not Estab. % 1       Neutrophils Absolute 1.4 - 7.0 x10E3/uL 1.0Low   1.6Low  R 2.3 R 3.3 R 4.5 R  Lymphocytes Absolute 0.7 - 3.1 x10E3/uL 1.3  1.0 R 0.8  R 0.6Low  R 0.4Low  R  Monocytes Absolute 0.1 - 0.9 x10E3/uL 0.3       EOS (ABSOLUTE) 0.0 - 0.4 x10E3/uL 0.1       Basophils Absolute 0.0 - 0.2 x10E3/uL 0.0  0.0 R, CM 0.0 R, CM 0.0 R, CM 0.0 R, CM  Immature Granulocytes Not Estab. % 0       Immature Grans (Abs) 0.0 - 0.1 x10E3/uL 0.0       Hematology Comments:  Note:       Comment: Verified by microscopic examination.  Lymphocytes Relative   32  23  13  8    Monocytes Relative   11  9  10  10    Monocytes Absolute   0.3  0.3  0.4  0.5   Eosinophils Relative   3  3  1  1    Eosinophils Absolute   0.1  0.1  0.1  0.1   Basophils Relative   0  0  0  0   Resulting Agency  [facility] [place] [facility] [place] [facility] [place] [facility]    Ref Range & Units 59mo ago (08/02/18) 25mo ago (04/21/18) 15mo ago (04/20/18) 27mo ago (04/19/18) 47mo ago (04/19/18)  Glucose 65 - 99 mg/dL 70  85  99  110High   382High    BUN 8 - 27 mg/dL 11  12 R 13 R 14 R 14 R  Creatinine, Ser 0.57 - 1.00 mg/dL 0.94  0.75 R 0.86 R 0.85 R 0.84 R  GFR calc non Af Amer >59 mL/min/1.73 55Low   >60 R 59Low  R >60 R >60 R  GFR calc Af Amer >59 mL/min/1.73 64  >60 R, CM >60 R, CM >60 R, CM >60 R, CM  BUN/Creatinine Ratio 12 - 28 12       Sodium 134 - 144 mmol/L 134  140 R 140 R 138 R 134Low  R  Potassium 3.5 - 5.2 mmol/L 4.4  3.7 R 3.8 R 4.1 R, CM 5.3High  R, CM  Chloride 96 - 106 mmol/L 96  113High  R 112High  R 112High  R 112High  R  CO2 20 - 29 mmol/L 20  23 R 23 R 21Low  R 21Low  R  Calcium 8.7 - 10.3 mg/dL 10.1  8.8Low  R 9.0 R 8.7Low  R 8.1Low  R  Total  Protein 6.0 - 8.5 g/dL 7.4       Albumin 3.5 - 4.7 g/dL 4.0       Globulin, Total 1.5 - 4.5 g/dL 3.4       Albumin/Globulin Ratio 1.2 - 2.2 1.2       Bilirubin Total 0.0 - 1.2 mg/dL 0.3       Alkaline Phosphatase 39 - 117 IU/L 80       AST 0 - 40 IU/L 16       ALT 0 - 32 IU/L 9           Component Value Date/Time   NA 141 09/12/2018 1445   NA 134 08/02/2018 1105   NA 141 02/19/2017 1111   K 3.9 09/12/2018 1445   K 4.3  02/19/2017 1111   CL 106 09/12/2018 1445   CO2 28 09/12/2018 1445   CO2 26 02/19/2017 1111   GLUCOSE 97 09/12/2018 1445   GLUCOSE 72 02/19/2017 1111   BUN 17 09/12/2018 1445   BUN 11 08/02/2018 1105   BUN 16.7 02/19/2017 1111   CREATININE 1.14 (H) 09/12/2018 1445   CREATININE 0.9 02/19/2017 1111   CALCIUM 9.8 09/12/2018 1445   CALCIUM 10.2 02/19/2017 1111   PROT 7.5 09/12/2018 1445   PROT 7.4 08/02/2018 1105   PROT 7.5 02/19/2017 1111   ALBUMIN 3.7 09/12/2018 1445   ALBUMIN 4.0 08/02/2018 1105   ALBUMIN 3.8 02/19/2017 1111   AST 14 (L) 09/12/2018 1445   AST 14 02/19/2017 1111   ALT 8 09/12/2018 1445   ALT 8 02/19/2017 1111   ALKPHOS 73 09/12/2018 1445   ALKPHOS 92 02/19/2017 1111   BILITOT 0.4 09/12/2018 1445   BILITOT 0.3 08/02/2018 1105   BILITOT 0.36 02/19/2017 1111   GFRNONAA 42 (L) 09/12/2018 1445   GFRAA 49 (L) 09/12/2018 1445    No results found for: SPEP, UPEP  Lab Results  Component Value Date   WBC 3.1 (L) 09/12/2018   NEUTROABS 1.5 (L) 09/12/2018   HGB 10.3 (L) 09/12/2018   HCT 32.2 (L) 09/12/2018   MCV 87.5 09/12/2018   PLT 151 09/12/2018      Chemistry      Component Value Date/Time   NA 141 09/12/2018 1445   NA 134 08/02/2018 1105   NA 141 02/19/2017 1111   K 3.9 09/12/2018 1445   K 4.3 02/19/2017 1111   CL 106 09/12/2018 1445   CO2 28 09/12/2018 1445   CO2 26 02/19/2017 1111   BUN 17 09/12/2018 1445   BUN 11 08/02/2018 1105   BUN 16.7 02/19/2017 1111   CREATININE 1.14 (H) 09/12/2018 1445   CREATININE 0.9 02/19/2017 1111      Component Value Date/Time   CALCIUM 9.8 09/12/2018 1445   CALCIUM 10.2 02/19/2017 1111   ALKPHOS 73 09/12/2018 1445   ALKPHOS 92 02/19/2017 1111   AST 14 (L) 09/12/2018 1445   AST 14 02/19/2017 1111   ALT 8 09/12/2018 1445   ALT 8 02/19/2017 1111   BILITOT 0.4 09/12/2018 1445   BILITOT 0.3 08/02/2018 1105   BILITOT 0.36 02/19/2017 1111     The total time spent discussing the her most recent laboratory  studies, physical examination, and recommendations was 25 minutes. At least 50% of that time was spent in face-to-face discussion, counseling, and answering questions. There was ample time allotted to answer all questions.  This note was dictated using voice activated technology/software.  Unfortunately, typographical errors are not uncommon, and transcription is subject  to mistakes and regrettably misinterpretation.  If necessary, clarification of the above information can be discussed with me at any time.  FOLLOW UP: AS DIRECTED   Henreitta Leber, MD  Hematology/Oncology [facility] [address] [place],  [address] Office: [phone] DBZM: [phone]

## 2018-09-13 LAB — PROTEIN ELECTROPHORESIS, SERUM
A/G Ratio: 1 (ref 0.7–1.7)
ALBUMIN ELP: 3.5 g/dL (ref 2.9–4.4)
Alpha-1-Globulin: 0.3 g/dL (ref 0.0–0.4)
Alpha-2-Globulin: 0.5 g/dL (ref 0.4–1.0)
Beta Globulin: 1.1 g/dL (ref 0.7–1.3)
GLOBULIN, TOTAL: 3.4 g/dL (ref 2.2–3.9)
Gamma Globulin: 1.5 g/dL (ref 0.4–1.8)
M-Spike, %: 0.7 g/dL — ABNORMAL HIGH
TOTAL PROTEIN ELP: 6.9 g/dL (ref 6.0–8.5)

## 2018-09-13 LAB — IMMUNOFIXATION ELECTROPHORESIS
IgA: 496 mg/dL — ABNORMAL HIGH (ref 64–422)
IgG (Immunoglobin G), Serum: 1465 mg/dL (ref 700–1600)
IgM (Immunoglobulin M), Srm: 136 mg/dL (ref 26–217)
TOTAL PROTEIN ELP: 6.8 g/dL (ref 6.0–8.5)

## 2018-09-13 LAB — KAPPA/LAMBDA LIGHT CHAINS
KAPPA FREE LGHT CHN: 108.4 mg/L — AB (ref 3.3–19.4)
KAPPA, LAMDA LIGHT CHAIN RATIO: 1.44 (ref 0.26–1.65)
LAMDA FREE LIGHT CHAINS: 75.3 mg/L — AB (ref 5.7–26.3)

## 2018-09-13 LAB — IGG, IGA, IGM
IGA: 508 mg/dL — AB (ref 64–422)
IGM (IMMUNOGLOBULIN M), SRM: 136 mg/dL (ref 26–217)
IgG (Immunoglobin G), Serum: 1500 mg/dL (ref 700–1600)

## 2018-10-19 ENCOUNTER — Encounter: Payer: Self-pay | Admitting: Internal Medicine

## 2018-10-19 ENCOUNTER — Other Ambulatory Visit (INDEPENDENT_AMBULATORY_CARE_PROVIDER_SITE_OTHER): Payer: Medicare Other | Admitting: Internal Medicine

## 2018-10-19 VITALS — BP 150/68 | HR 60 | Resp 20

## 2018-10-19 DIAGNOSIS — R112 Nausea with vomiting, unspecified: Secondary | ICD-10-CM

## 2018-10-19 MED ORDER — ONDANSETRON 4 MG PO TBDP: 4.0000 mg | ORAL_TABLET | Freq: Three times a day (TID) | ORAL | 0 refills | Status: DC | PRN

## 2018-10-19 NOTE — Progress Notes (Signed)
Subjective:    Patient ID: Erika Day, female    DOB: 1930-04-21, 84 y.o.   MRN: [id number]  HPI   Home Visit  Husband, [name], called to share she was having vomiting and was not able to make it to clinic for an acute appt due to this.  Was feeling okay yesterday, just decreased appetite.  Last evening, she was able to eat instant grits and maybe some milk but became a bit nauseated thereafter.   This morning when arose, had a couple of swallows of Ginger Ale, belched and vomited large amount of gray watery fluid.  Has vomited twice more since.  Last was about 10:15 a.m. No abdominal pain, no bloating, No diarrhea.  Last BM was small with lots of flatus this morning.   No fever No respiratory symptoms including dyspnea, cough, sore throat.  She has had a bit of clear phlegm, however. No dysuria or frequency.  No abnormal odor to urine. Having difficulties getting into her new hospital bed--it is too high.   Has received a flu vaccine this fall. Was able to take her meds yesterday, but not this morning.  Current Meds  Medication Sig  . acetaminophen (TYLENOL) 325 MG tablet Take 2 tablets (650 mg total) by mouth every 4 (four) hours as needed for mild pain, moderate pain, fever or headache.  . cetirizine (ZYRTEC) 10 MG tablet 1 tab by mouth daily for allergy symptoms  . dicyclomine (BENTYL) 10 MG capsule 1 cap by mouth every 6 hours as needed for abdominal cramping.  Water engineer Bandages & Supports (MEDICAL COMPRESSION STOCKINGS) MISC 20-30 mm Hg with toe thigh high graduated compression stockings on every morning and off at night.  . furosemide (LASIX) 20 MG tablet 1 tab by mouth once daily in morning  . lisinopril (PRINIVIL,ZESTRIL) 20 MG tablet Take 1 tablet (20 mg total) by mouth daily.  Marland Kitchen LOTEMAX 0.5 % ophthalmic suspension Place 1 drop into the left eye daily.  . pantoprazole (PROTONIX) 40 MG tablet 1 tab by mouth daily 1/2 hour before breakfast on an empty stomach     Allergies  Allergen Reactions  . Morphine And Related Other (See Comments)    Blood sugar dropped one-time       Review of Systems     Objective:   Physical Exam NAD HEENT:  PERRL, EOMI, TMs pearly gray, throat without injection, MMM Neck:  Supple, No adenopathy Chest:  CTA CV:  RRR with grade 2-3/6 SEM LSB to carotids.  RAdial and DP pulses normal and equal Abd:  S, NT, No HSM or mass, + BS.  No high pitches sounds LE:  Left greater than right pitting edema, though down and with much dry flakiness of skin.  Compression stockings not yet on.  Mr. Toure smoking when entered home.  Home very warm with smell of cigarette smoke.     Assessment & Plan:  1.  Vomiting:  Appears okay with hydration status.  Returned to home with wipe and sterile urine cup to obtain urine sample to rule out UTI.   To call when she has obtained the specimen.  Health Aid will help her obtain. Rx for Zofran 4 mg 1-2 tabs every 8 hours as needed for nausea and vomiting. Discussed if she is unable to keep clear liquids down, to call.  Concerned for redevelopment of SBO/partial SBO, though abdominal exam benign currently.  2.  Cigarette smoke exposure:  Once again, asked Mr. Sybert to take his  smoking outside, or even better, to get the Nicotine lozenges I have prescribed in the past to quit.  Discussed how the hot smoke filled house is not good for either Didi or him.

## 2018-10-20 ENCOUNTER — Other Ambulatory Visit (INDEPENDENT_AMBULATORY_CARE_PROVIDER_SITE_OTHER): Payer: Medicare Other

## 2018-10-20 DIAGNOSIS — R112 Nausea with vomiting, unspecified: Secondary | ICD-10-CM | POA: Diagnosis not present

## 2018-10-20 LAB — POCT URINALYSIS DIPSTICK
Bilirubin, UA: NEGATIVE
Glucose, UA: NEGATIVE
Ketones, UA: NEGATIVE
Nitrite, UA: POSITIVE
Protein, UA: POSITIVE — AB
Spec Grav, UA: 1.015 (ref 1.010–1.025)
Urobilinogen, UA: 0.2 E.U./dL
pH, UA: 5 (ref 5.0–8.0)

## 2018-10-20 MED ORDER — CIPROFLOXACIN HCL 500 MG PO TABS: ORAL_TABLET | ORAL | 0 refills | Status: DC

## 2018-10-20 NOTE — Progress Notes (Signed)
Patient ID: Erika Day, female   DOB: 24-May-1930, 84 y.o.   MRN: [id number]  UA supports urine infection with + Nitrite and leuks.  Bad odor Cipro 500 mg twice daily for 7 days. Sending for culture

## 2018-10-21 ENCOUNTER — Telehealth: Payer: Self-pay | Admitting: Internal Medicine

## 2018-10-24 LAB — URINE CULTURE

## 2018-10-28 NOTE — Telephone Encounter (Signed)
Error

## 2018-11-01 ENCOUNTER — Other Ambulatory Visit: Payer: Self-pay | Admitting: Medical Oncology

## 2018-11-01 DIAGNOSIS — D472 Monoclonal gammopathy: Secondary | ICD-10-CM

## 2018-11-01 DIAGNOSIS — C8584 Other specified types of non-Hodgkin lymphoma, lymph nodes of axilla and upper limb: Secondary | ICD-10-CM

## 2018-11-03 ENCOUNTER — Ambulatory Visit (INDEPENDENT_AMBULATORY_CARE_PROVIDER_SITE_OTHER): Payer: Medicare Other | Admitting: Internal Medicine

## 2018-11-03 ENCOUNTER — Inpatient Hospital Stay: Payer: Medicare Other

## 2018-11-03 ENCOUNTER — Inpatient Hospital Stay: Payer: Medicare Other | Attending: Hematology and Oncology | Admitting: Hematology and Oncology

## 2018-11-03 ENCOUNTER — Encounter: Payer: Self-pay | Admitting: Internal Medicine

## 2018-11-03 VITALS — BP 138/88 | HR 68 | Resp 12

## 2018-11-03 DIAGNOSIS — S0083XA Contusion of other part of head, initial encounter: Secondary | ICD-10-CM | POA: Diagnosis not present

## 2018-11-03 DIAGNOSIS — C8584 Other specified types of non-Hodgkin lymphoma, lymph nodes of axilla and upper limb: Secondary | ICD-10-CM | POA: Insufficient documentation

## 2018-11-03 DIAGNOSIS — D631 Anemia in chronic kidney disease: Secondary | ICD-10-CM | POA: Insufficient documentation

## 2018-11-03 DIAGNOSIS — D72819 Decreased white blood cell count, unspecified: Secondary | ICD-10-CM | POA: Insufficient documentation

## 2018-11-03 DIAGNOSIS — D472 Monoclonal gammopathy: Secondary | ICD-10-CM | POA: Insufficient documentation

## 2018-11-03 DIAGNOSIS — D696 Thrombocytopenia, unspecified: Secondary | ICD-10-CM | POA: Insufficient documentation

## 2018-11-03 DIAGNOSIS — Z79899 Other long term (current) drug therapy: Secondary | ICD-10-CM | POA: Insufficient documentation

## 2018-11-03 DIAGNOSIS — W19XXXA Unspecified fall, initial encounter: Secondary | ICD-10-CM

## 2018-11-03 NOTE — Patient Instructions (Signed)
Check on Erika Day every hour during the day and every 2 hours at night throughout the next 24 hours. Take her to ED if she seems to not be thinking clearly or his hard to wake up. Ice to forehead in a ziplock bag to forehead for 20 minutes every 2 hours.   Keep head elevated. She needs a pill box and someone to look at her med list and set up appropriately.  She is missing her meds.

## 2018-11-03 NOTE — Progress Notes (Signed)
Subjective:    Patient ID: Erika Day, female    DOB: Feb 01, 1930, 85 y.o.   MRN: [id number]  HPI   Last night, fell.  Just after midnight, she awakened with sense she needed to use the commode.  She transferred to her wheelchair and was leaning forward contemplating the move to her commode.  Dozed off as she got comfortable and ultimately fell forward.   Fall was broken by the commode to her right side.as she grabbed for it and fell slowly.  Hit her left forehead on floor and landed on left lateral leg and thigh as slid to floor.  She did not hit hard and did not lose consciousness.   Leg did not really hurt.   She was able to bend her knees and bear her weight so that her daughter and husband helped her up. She sat up in wheelchair with the door open as the house was so warm (her husband indeed keeps the house above 80) for about 1 hour and then rolled back to bed.   She was able to transfer back into bed without help.   She has chronic left knee pain and that continues to bother her. She did not apply ice or a cold pack to her head or leg as of yet. Head of her bed is flat, but she does sleep on 2 pillows.  Able to sleep ok and slept until 6 a.m.  She felt clear in her mind upon getting up.  No numbness, tingling or weakness focally.    Her left forehead is somewhat tender to touch.    Her pills are in disarray at her home.   She is on Cipro, should have finished some time ago, but she continues to not take appropriately. She is not taking Protonix nor Lisinopril appropriately.  Saw one of her bottles in her bag and was from 2018.  Rick Duff is her health aide who helps her with ADLs.  She does not have her phone number.    Current Meds  Medication Sig  . acetaminophen (TYLENOL) 325 MG tablet Take 2 tablets (650 mg total) by mouth every 4 (four) hours as needed for mild pain, moderate pain, fever or headache.  . cetirizine (ZYRTEC) 10 MG tablet 1 tab by mouth daily for allergy  symptoms  . ciprofloxacin (CIPRO) 500 MG tablet 1 tab by mouth twice daily for 7 days.  Water engineer Bandages & Supports (MEDICAL COMPRESSION STOCKINGS) MISC 20-30 mm Hg with toe thigh high graduated compression stockings on every morning and off at night.  . furosemide (LASIX) 20 MG tablet 1 tab by mouth once daily in morning  . LOTEMAX 0.5 % ophthalmic suspension Place 1 drop into the left eye daily.  . pantoprazole (PROTONIX) 40 MG tablet 1 tab by mouth daily 1/2 hour before breakfast on an empty stomach  . polyethylene glycol powder (GLYCOLAX/MIRALAX) powder Mix 17 g powder in 8 oz of water and drink once daily  . senna-docusate (SENOKOT-S) 8.6-50 MG tablet Take 2 tablets by mouth at bedtime as needed for mild constipation.      Review of Systems     Objective:   Physical Exam   HEENT:  PERRL, EOMI, unable to seed discs adequately Mild swelling without discoloration over left forehead.  Swelling down into upper eyelid somewhat. No crepitation under the area of swelling.  Mildly tender.   Neck:  Supple, No adenopathy Lungs:  CTA CV:  RRR without rub, but sysolic  murmur without change.  Radial and DP pulses normal and equal Abd:  S, NT Left LE with significantly more pitting edema than right, which is baseline.  She is not wearing her compression stockings. Mild tenderness over left knee, which she states is baseline.  Unable to see any contusions to lower left leg.  NT on palpation of rest of leg and hip.  I did help her transfer into and out of car to wheelchair and back and this seemed her baseline with her ability to bear weight. Neuro:  A & O x 3, CN  II-XII grossly intact.  DTRs 2+/4 throughout.  Motor 5/5 throughout, sensory to light touch normal.         Assessment & Plan:  1.  Closed head injury:  Nothing to suspect subdural or other cerebral hematoma.  To check on her frequently and take to ED if any mental status changes. Her husband states she is having general memory  issues for some time.  He has not expressed this before. Appt made to address memory with MMSE and get more information about this in 1 month.  2.  Noncompliance with meds:  Dicussed they need to get a pill box He Aide to call me for pill box set up. I will help out with set up.  It does not sound like she is taking her meds appropriately and when I was at their home at beginning of week to look at her hospital bed,  Could not find where her up to date meds were.

## 2018-11-04 ENCOUNTER — Other Ambulatory Visit: Payer: Self-pay

## 2018-11-04 MED ORDER — CETIRIZINE HCL 10 MG PO TABS: ORAL_TABLET | ORAL | 3 refills | Status: DC

## 2018-11-04 MED ORDER — POLYETHYLENE GLYCOL 3350 17 GM/SCOOP PO POWD: ORAL | 11 refills | Status: DC

## 2018-11-04 MED ORDER — LISINOPRIL 20 MG PO TABS: 20.0000 mg | ORAL_TABLET | Freq: Every day | ORAL | 3 refills | Status: DC

## 2018-11-09 ENCOUNTER — Telehealth: Payer: Self-pay | Admitting: Hematology and Oncology

## 2018-11-09 NOTE — Telephone Encounter (Signed)
Spoke with dtr re 1/30 appointment. Per dtr patient cannot be reached at the number listed as home in the system. Patient information will be updated at 1/30 visit.

## 2018-11-16 ENCOUNTER — Encounter: Payer: Self-pay | Admitting: Internal Medicine

## 2018-11-16 ENCOUNTER — Ambulatory Visit (INDEPENDENT_AMBULATORY_CARE_PROVIDER_SITE_OTHER): Payer: Medicare Other | Admitting: Internal Medicine

## 2018-11-16 VITALS — BP 130/90 | HR 72 | Resp 12

## 2018-11-16 DIAGNOSIS — M1990 Unspecified osteoarthritis, unspecified site: Secondary | ICD-10-CM

## 2018-11-16 DIAGNOSIS — I10 Essential (primary) hypertension: Secondary | ICD-10-CM | POA: Diagnosis not present

## 2018-11-16 DIAGNOSIS — R600 Localized edema: Secondary | ICD-10-CM

## 2018-11-16 DIAGNOSIS — K219 Gastro-esophageal reflux disease without esophagitis: Secondary | ICD-10-CM | POA: Diagnosis not present

## 2018-11-16 DIAGNOSIS — J3089 Other allergic rhinitis: Secondary | ICD-10-CM

## 2018-11-16 MED ORDER — FUROSEMIDE 20 MG PO TABS: ORAL_TABLET | ORAL | 3 refills | Status: DC

## 2018-11-16 MED ORDER — LISINOPRIL 20 MG PO TABS: ORAL_TABLET | ORAL | 3 refills | Status: DC

## 2018-11-16 MED ORDER — CETIRIZINE HCL 10 MG PO TABS: ORAL_TABLET | ORAL | 3 refills | Status: DC

## 2018-11-16 MED ORDER — PANTOPRAZOLE SODIUM 40 MG PO TBEC: DELAYED_RELEASE_TABLET | ORAL | 3 refills | Status: DC

## 2018-11-16 MED ORDER — VITAMIN D3 1.25 MG (50000 UT) PO TABS: 1.0000 | ORAL_TABLET | ORAL | 0 refills | Status: DC

## 2018-11-16 MED ORDER — POLYETHYLENE GLYCOL 3350 17 GM/SCOOP PO POWD: ORAL | 11 refills | Status: DC

## 2018-11-16 MED ORDER — DICLOFENAC SODIUM 1 % TD GEL: 2.0000 g | Freq: Four times a day (QID) | TRANSDERMAL | 2 refills | Status: DC

## 2018-11-16 NOTE — Patient Instructions (Signed)
Please get your med list out and keep it with pill box in kitchen. Should be refilled by [name] every week on Sunday. Never run out of your pills.   When you have only enough pills to fill one more week in your pill box, you need to call in for a refill .

## 2018-11-16 NOTE — Progress Notes (Signed)
Subjective:   Patient ID: Erika Day, female    DOB: Nov 17, 1929, 85 y.o.   MRN: [id number]  HPI   1.  Pain in right shoulder, elbow, and hand/fingers.  Has chronic issues with both knees and known OA changes as well.   Two days ago, her right hand started to swell with redness and pain over her 2nd and 3rd MCP and somewhat over the 4th.   PIPs of 2nd and 3rd fingers also painful and stiff. She does not recall an injury.  No history of this happening before. No history of gout. No family history of inflammatory arthritis. No fever.  2.  Lots of phlegm in throat and needing to clear throat and cough.  This is a chronic issue, but worse recently.  Eyes are watery and itchy Husband, [name], is now smoking outside.  He has not picked up the nicotine patches or lozenges for cessation. Last week, went out to home and arranged pill box with daughter, [name] and Health aid, [name].   Does not sound like they are using the pill box as recommended.  No one is checking up on the box to see if she is taking meds appropriately. The directions for meds have been put away in the dark void of Alvera's bedroom drawers. She does not know if she is taking her allergy med or her Pantoprazole.    3.  Hypertension:    Not taking Lisinopril from what she knows today.  4.  Constipation:  On and off again problem.  Not clear what she is using.  Previously on Miralax for maintenance.  No stool since day before yesterday    Current Meds  Medication Sig  . Elastic Bandages & Supports (MEDICAL COMPRESSION STOCKINGS) MISC 20-30 mm Hg with toe thigh high graduated compression stockings on every morning and off at night.  . furosemide (LASIX) 20 MG tablet 1 tab by mouth once daily in morning  . LOTEMAX 0.5 % ophthalmic suspension Place 1 drop into the left eye daily.  . [DISCONTINUED] furosemide (LASIX) 20 MG tablet 1 tab by mouth once daily in morning   She is still taking Cipro, which she should have  finished.   Maybe taking Cetirizine. Maybe taking Furosemide Not taking Lisinopril  Allergies  Allergen Reactions  . Morphine And Related Other (See Comments)    Blood sugar dropped one-time          Review of Systems    Objective:  Physical Exam NAD HEENT:  PERRL, EOMI, Mild conjunctival injection and eyes watery.  TMs pearly gray, Posterior pharynx difficult to see.  Nasal mucosa boggy with clear discharge. Neck:  Supple, No adenopathy Chest:  CTA CV:  RRR with systolic murmur unchanged.  Radial and DP pulses normal and equal. LE:  Edema R>>L of LE perhaps a bit more than baseline. Left hand with swelling and mild erythema over 2nd and 3rd MCP.  Tender over thumb and ring finger MCPs without the swelling or erythema.  PIPs also tender diffusely of left hand.  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   Full ROM      Assessment & Plan:  1.  Left hand joint pain and swelling:  Her exam more consistent with OA, but does appear to have new inflammatory changes not seen before.   ANA, RF, Sed Rate, Uric Acid, BMP, CBC. Diclofenac Gel 2 g up to 4 times daily to areas.  2.  Hypertension:  Not taking Lisinopril again.  Call out to home health aid and daughter.   Have been to their home multiple times to set up pill box and continue to fail follow up.  Not clear what the issue is.   Sent out with instructions on how to fill pill box to daughter [name].   Med list given again with instructions  3.  Allergies:  To take her Cetirizine.  Also refilled Pantoprazole in case this is adding to cough.  4.  Constipation:  Refilled Miralax to be used regularly to prevent constipation.   Unlikely to be followed based on previous activity.  5.  LE edema:  Furosemide refilled.  Again to not run out of medication.   Chronic issues with leg dependency.  Not clear she is using compression stockings regularly as well.

## 2018-11-17 LAB — CBC WITH DIFFERENTIAL/PLATELET
Basophils Absolute: 0 10*3/uL (ref 0.0–0.2)
Basos: 0 %
EOS (ABSOLUTE): 0.1 10*3/uL (ref 0.0–0.4)
Eos: 2 %
Hematocrit: 31.1 % — ABNORMAL LOW (ref 34.0–46.6)
Hemoglobin: 10.3 g/dL — ABNORMAL LOW (ref 11.1–15.9)
Immature Grans (Abs): 0 10*3/uL (ref 0.0–0.1)
Immature Granulocytes: 0 %
LYMPHS ABS: 1.3 10*3/uL (ref 0.7–3.1)
Lymphs: 45 %
MCH: 28.9 pg (ref 26.6–33.0)
MCHC: 33.1 g/dL (ref 31.5–35.7)
MCV: 87 fL (ref 79–97)
MONOS ABS: 0.2 10*3/uL (ref 0.1–0.9)
Monocytes: 7 %
Neutrophils Absolute: 1.3 10*3/uL — ABNORMAL LOW (ref 1.4–7.0)
Neutrophils: 46 %
Platelets: 155 10*3/uL (ref 150–450)
RBC: 3.57 x10E6/uL — ABNORMAL LOW (ref 3.77–5.28)
RDW: 13.7 % (ref 11.7–15.4)
WBC: 2.9 10*3/uL — ABNORMAL LOW (ref 3.4–10.8)

## 2018-11-17 LAB — BASIC METABOLIC PANEL
BUN/Creatinine Ratio: 9 — ABNORMAL LOW (ref 12–28)
BUN: 8 mg/dL (ref 8–27)
CO2: 23 mmol/L (ref 20–29)
Calcium: 9.8 mg/dL (ref 8.7–10.3)
Chloride: 106 mmol/L (ref 96–106)
Creatinine, Ser: 0.91 mg/dL (ref 0.57–1.00)
GFR calc Af Amer: 66 mL/min/{1.73_m2} (ref >59)
GFR calc non Af Amer: 57 mL/min/{1.73_m2} — ABNORMAL LOW (ref >59)
Glucose: 84 mg/dL (ref 65–99)
Potassium: 3.9 mmol/L (ref 3.5–5.2)
Sodium: 141 mmol/L (ref 134–144)

## 2018-11-17 LAB — RHEUMATOID FACTOR: Rheumatoid fact SerPl-aCnc: 20.2 IU/mL — ABNORMAL HIGH (ref 0.0–13.9)

## 2018-11-17 LAB — ANA W/REFLEX IF POSITIVE: Anti Nuclear Antibody(ANA): NEGATIVE

## 2018-11-17 LAB — URIC ACID: Uric Acid: 5.6 mg/dL (ref 2.5–7.1)

## 2018-11-17 LAB — SEDIMENTATION RATE: Sed Rate: 24 mm/hr (ref 0–40)

## 2018-12-01 ENCOUNTER — Encounter: Payer: Self-pay | Admitting: Hematology and Oncology

## 2018-12-01 ENCOUNTER — Telehealth: Payer: Self-pay | Admitting: Hematology and Oncology

## 2018-12-01 ENCOUNTER — Inpatient Hospital Stay (HOSPITAL_BASED_OUTPATIENT_CLINIC_OR_DEPARTMENT_OTHER): Payer: Medicare Other | Admitting: Hematology and Oncology

## 2018-12-01 ENCOUNTER — Inpatient Hospital Stay: Payer: Medicare Other

## 2018-12-01 DIAGNOSIS — C8584 Other specified types of non-Hodgkin lymphoma, lymph nodes of axilla and upper limb: Secondary | ICD-10-CM

## 2018-12-01 DIAGNOSIS — D696 Thrombocytopenia, unspecified: Secondary | ICD-10-CM | POA: Diagnosis not present

## 2018-12-01 DIAGNOSIS — D472 Monoclonal gammopathy: Secondary | ICD-10-CM

## 2018-12-01 DIAGNOSIS — D631 Anemia in chronic kidney disease: Secondary | ICD-10-CM | POA: Diagnosis not present

## 2018-12-01 DIAGNOSIS — D72819 Decreased white blood cell count, unspecified: Secondary | ICD-10-CM

## 2018-12-01 DIAGNOSIS — Z79899 Other long term (current) drug therapy: Secondary | ICD-10-CM

## 2018-12-01 DIAGNOSIS — D638 Anemia in other chronic diseases classified elsewhere: Secondary | ICD-10-CM

## 2018-12-01 LAB — CMP (CANCER CENTER ONLY)
ALT: <6 U/L (ref 0–44)
AST: 10 U/L — ABNORMAL LOW (ref 15–41)
Albumin: 3.8 g/dL (ref 3.5–5.0)
Alkaline Phosphatase: 67 U/L (ref 38–126)
Anion gap: 7 (ref 5–15)
BUN: 13 mg/dL (ref 8–23)
CO2: 28 mmol/L (ref 22–32)
Calcium: 9.4 mg/dL (ref 8.9–10.3)
Chloride: 105 mmol/L (ref 98–111)
Creatinine: 1.14 mg/dL — ABNORMAL HIGH (ref 0.44–1.00)
GFR, EST NON AFRICAN AMERICAN: 44 mL/min — AB (ref >60)
GFR, Est AFR Am: 50 mL/min — ABNORMAL LOW (ref >60)
Glucose, Bld: 121 mg/dL — ABNORMAL HIGH (ref 70–99)
Potassium: 3.6 mmol/L (ref 3.5–5.1)
Sodium: 140 mmol/L (ref 135–145)
Total Bilirubin: 0.4 mg/dL (ref 0.3–1.2)
Total Protein: 7.2 g/dL (ref 6.5–8.1)

## 2018-12-01 LAB — CBC WITH DIFFERENTIAL (CANCER CENTER ONLY)
Abs Immature Granulocytes: 0.01 10*3/uL (ref 0.00–0.07)
Basophils Absolute: 0 10*3/uL (ref 0.0–0.1)
Basophils Relative: 1 %
EOS ABS: 0 10*3/uL (ref 0.0–0.5)
Eosinophils Relative: 2 %
HCT: 32.6 % — ABNORMAL LOW (ref 36.0–46.0)
Hemoglobin: 10.4 g/dL — ABNORMAL LOW (ref 12.0–15.0)
Immature Granulocytes: 0 %
Lymphocytes Relative: 43 %
Lymphs Abs: 1 10*3/uL (ref 0.7–4.0)
MCH: 28.7 pg (ref 26.0–34.0)
MCHC: 31.9 g/dL (ref 30.0–36.0)
MCV: 89.8 fL (ref 80.0–100.0)
Monocytes Absolute: 0.2 10*3/uL (ref 0.1–1.0)
Monocytes Relative: 9 %
Neutro Abs: 1.1 10*3/uL — ABNORMAL LOW (ref 1.7–7.7)
Neutrophils Relative %: 45 %
Platelet Count: 120 10*3/uL — ABNORMAL LOW (ref 150–400)
RBC: 3.63 MIL/uL — ABNORMAL LOW (ref 3.87–5.11)
RDW: 12.2 % (ref 11.5–15.5)
WBC Count: 2.4 10*3/uL — ABNORMAL LOW (ref 4.0–10.5)
nRBC: 0 % (ref 0.0–0.2)

## 2018-12-01 NOTE — Telephone Encounter (Signed)
Gave avs and calendar ° °

## 2018-12-02 ENCOUNTER — Encounter: Payer: Self-pay | Admitting: Hematology and Oncology

## 2018-12-02 NOTE — Progress Notes (Signed)
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 It could be related to aging or her African-American heritage There is no need for further work-up in this regard and she does not need prophylactic antibiotic therapy  Thrombocytopenia ([place]) She has mild, chronic intermittent thrombocytopenia, could be related to prior treatment She is not symptomatic Observe only  MGUS (monoclonal gammopathy of unknown significance) She had prior history of MGUS Her last serum protein electrophoresis from November 2019 is stable We will continue to monitor that once a year.  Anemia, chronic disease She has multifactorial anemia, most likely, anemia of chronic illness, related to borderline renal function She is not symptomatic Observe only   Orders Placed This Encounter  Procedures  . CBC with Differential/Platelet    Standing Status:   Future    Standing Expiration Date:   01/06/2020    INTERVAL HISTORY: Please see below for problem oriented charting. She returns with her husband for further follow-up She feels well No new lymphadenopathy Denies recent infection, fever or chills Appetite is fair  SUMMARY OF ONCOLOGIC HISTORY:   Marginal zone lymphoma of axilla ([place])   05/18/2015 Imaging    Ct abdomen and pelvis showed  reactive lymphadenopathy and stool filled colon    02/13/2016 Imaging    Ct chest showed pneumonia    05/08/2016 Imaging    screening mammogram revealed right axillary lymphadenopathy    05/12/2016 Procedure    She underwent core needle biopsy of axillary LN    05/12/2016 Pathology Results    [phone] Biopsy showed low grade non-Hodgkin B cell lymphoma, likely marginal zone lymphoma    05/15/2016 Pathology Results    Peripheral blood positive for deletion 13q    05/25/2016 PET scan    Mildly hypermetabolic mediastinal, left axillary and right inguinal lymph nodes. 2. Mild hypermetabolism in the left thyroid, nonspecific    06/27/2016 Imaging    Ct abdomen showed sigmoid volvulus resulting in large bowel obstruction. Splenomegaly, in keeping with patient's history of lymphoma.     06/30/2016 Pathology Results    Accession: [id number] pathology specimen showed low grade lymphoma    06/30/2016 Surgery    She had LAPAROSCOPIC SIGMOID COLECTOMY,  Rigid sigmoidoscopy, with Primary repair recurrent incisional hernia       11/13/2016 PET scan    No evidence disease progression. 2. Mild decrease in metabolic activity in axillary lymph nodes. 3. Similar metabolic activity of small mediastinal lymph nodes [place] 3 . 4. Similar activity in inguinal lymph nodes. 5. No abnormal bowel activity identified     REVIEW OF SYSTEMS:   Constitutional: Denies fevers, chills or abnormal weight loss Eyes: Denies blurriness of vision Ears, nose, mouth, throat, and face: Denies mucositis or sore throat Respiratory: Denies cough, dyspnea or wheezes Cardiovascular: Denies palpitation, chest discomfort or lower extremity swelling Gastrointestinal:  Denies nausea, heartburn or change in bowel habits Skin: Denies abnormal skin rashes  Lymphatics: Denies new lymphadenopathy or easy bruising Neurological:Denies numbness, tingling or new weaknesses Behavioral/Psych: Mood is stable, no new changes  All other  systems were reviewed with the patient and are negative.  I have reviewed the past medical history, past surgical history, social history and family history with the patient and they are unchanged from previous note.  ALLERGIES:  is allergic to morphine and related.  MEDICATIONS:  Current Outpatient Medications  Medication Sig Dispense Refill  . acetaminophen (TYLENOL) 325 MG tablet Take 2 tablets (650 mg total) by mouth every 4 (four) hours as needed for mild pain, moderate pain, fever or headache. (Patient not taking: Reported on 11/16/2018) 20 tablet 0  . cetirizine (ZYRTEC) 10 MG tablet 1 tab by mouth once daily with evening meal for allergies 90 tablet 3  . Cholecalciferol (VITAMIN D3) 1.25 MG (50000 UT) TABS Take 1 tablet by mouth once a week. on Saturday 8 tablet 0  . diclofenac sodium (VOLTAREN) 1 % GEL Apply 2 g topically 4 (four) times daily. 100 g 2  . dicyclomine (BENTYL) 10 MG capsule 1 cap by mouth every 6 hours as needed for abdominal cramping. (Patient not taking: Reported on 11/03/2018) 30 capsule 2  . Elastic Bandages & Supports (MEDICAL COMPRESSION STOCKINGS) MISC 20-30 mm Hg with toe thigh high graduated compression stockings on every morning and off at night. 3 each 1  . furosemide (LASIX) 20 MG tablet 1 tab by mouth once daily in morning 90 tablet 3  . lisinopril (PRINIVIL,ZESTRIL) 20 MG tablet 1 tab by mouth every morning 90 tablet 3  . LOTEMAX 0.5 % ophthalmic suspension Place 1 drop into the left eye daily. 5 mL 11  . ondansetron (ZOFRAN-ODT) 4 MG disintegrating tablet Take 1 tablet (4 mg total) by mouth every 8 (eight) hours as needed for nausea or vomiting. (Patient not taking: Reported on 11/03/2018) 20 tablet 0  . pantoprazole (PROTONIX) 40 MG tablet 1 tab by mouth daily at 10 pm on empty stomach 90 tablet 3  . polyethylene glycol powder (GLYCOLAX/MIRALAX) powder Mix 17 g powder in 8 oz of water and drink once daily in the morning 578 g 11  . senna-docusate (SENOKOT-S)  8.6-50 MG tablet Take 2 tablets by mouth at bedtime as needed for mild constipation. (Patient not taking: Reported on 11/16/2018) 30 tablet 1  . traMADol (ULTRAM) 50 MG tablet Take 1 tablet (50 mg total) by mouth every 8 (eight) hours as needed. (Patient not taking: Reported on 10/19/2018) 15 tablet 0   No current facility-administered medications for this visit.     PHYSICAL EXAMINATION: ECOG PERFORMANCE STATUS: 2 - Symptomatic, <50% confined to bed  Vitals:   12/01/18 1302  BP: (!) 160/57  Pulse: 66  Resp: 18  Temp: 98.8 F (37.1 C)  SpO2: 100%   There were no vitals filed for this visit.  GENERAL:alert, no distress and comfortable.  She looks thin and frail SKIN: skin color, texture, turgor are normal, no rashes or significant lesions EYES: normal, Conjunctiva are pink and non-injected, sclera clear OROPHARYNX:no exudate, no erythema and lips, buccal mucosa, and tongue normal  NECK: supple, thyroid normal size, non-tender, without nodularity LYMPH:  no palpable lymphadenopathy in the cervical, axillary or inguinal LUNGS: clear to auscultation and percussion with normal breathing effort HEART: regular rate & rhythm and no murmurs and no lower extremity edema ABDOMEN:abdomen soft, non-tender and normal bowel sounds.  No palpable splenomegaly Musculoskeletal:no cyanosis of digits and no clubbing  NEURO: alert &  oriented x 3 with fluent speech, no focal motor/sensory deficits  LABORATORY DATA:  I have reviewed the data as listed    Component Value Date/Time   NA 140 12/01/2018 1239   NA 141 11/16/2018 1129   NA 141 02/19/2017 1111   K 3.6 12/01/2018 1239   K 4.3 02/19/2017 1111   CL 105 12/01/2018 1239   CO2 28 12/01/2018 1239   CO2 26 02/19/2017 1111   GLUCOSE 121 (H) 12/01/2018 1239   GLUCOSE 72 02/19/2017 1111   BUN 13 12/01/2018 1239   BUN 8 11/16/2018 1129   BUN 16.7 02/19/2017 1111   CREATININE 1.14 (H) 12/01/2018 1239   CREATININE 0.9 02/19/2017 1111   CALCIUM  9.4 12/01/2018 1239   CALCIUM 10.2 02/19/2017 1111   PROT 7.2 12/01/2018 1239   PROT 7.4 08/02/2018 1105   PROT 7.5 02/19/2017 1111   ALBUMIN 3.8 12/01/2018 1239   ALBUMIN 4.0 08/02/2018 1105   ALBUMIN 3.8 02/19/2017 1111   AST 10 (L) 12/01/2018 1239   AST 14 02/19/2017 1111   ALT <6 12/01/2018 1239   ALT 8 02/19/2017 1111   ALKPHOS 67 12/01/2018 1239   ALKPHOS 92 02/19/2017 1111   BILITOT 0.4 12/01/2018 1239   BILITOT 0.36 02/19/2017 1111   GFRNONAA 44 (L) 12/01/2018 1239   GFRAA 50 (L) 12/01/2018 1239    No results found for: SPEP, UPEP  Lab Results  Component Value Date   WBC 2.4 (L) 12/01/2018   NEUTROABS 1.1 (L) 12/01/2018   HGB 10.4 (L) 12/01/2018   HCT 32.6 (L) 12/01/2018   MCV 89.8 12/01/2018   PLT 120 (L) 12/01/2018      Chemistry      Component Value Date/Time   NA 140 12/01/2018 1239   NA 141 11/16/2018 1129   NA 141 02/19/2017 1111   K 3.6 12/01/2018 1239   K 4.3 02/19/2017 1111   CL 105 12/01/2018 1239   CO2 28 12/01/2018 1239   CO2 26 02/19/2017 1111   BUN 13 12/01/2018 1239   BUN 8 11/16/2018 1129   BUN 16.7 02/19/2017 1111   CREATININE 1.14 (H) 12/01/2018 1239   CREATININE 0.9 02/19/2017 1111      Component Value Date/Time   CALCIUM 9.4 12/01/2018 1239   CALCIUM 10.2 02/19/2017 1111   ALKPHOS 67 12/01/2018 1239   ALKPHOS 92 02/19/2017 1111   AST 10 (L) 12/01/2018 1239   AST 14 02/19/2017 1111   ALT <6 12/01/2018 1239   ALT 8 02/19/2017 1111   BILITOT 0.4 12/01/2018 1239   BILITOT 0.36 02/19/2017 1111      All questions were answered. The patient knows to call the clinic with any problems, questions or concerns. No barriers to learning was detected.  I spent 15 minutes counseling the patient face to face. The total time spent in the appointment was 20 minutes and more than 50% was on counseling and review of test results  Heath Lark, MD 12/02/2018 7:33 AM

## 2018-12-02 NOTE — Assessment & Plan Note (Signed)
She has multifactorial anemia, most likely, anemia of chronic illness, related to borderline renal function She is not symptomatic Observe only

## 2018-12-02 NOTE — Assessment & Plan Note (Signed)
She has chronic leukopenia for many years. It could be related to aging or her African-American heritage There is no need for further work-up in this regard and she does not need prophylactic antibiotic therapy

## 2018-12-02 NOTE — Assessment & Plan Note (Signed)
Examination does not reveal any signs of lymphoma recurrence She has no recurrence of lymphocytosis Her last CT imaging from June of last year showed no evidence of splenomegaly I recommend close observation with repeat history, physical examination and blood work again in 3 months

## 2018-12-02 NOTE — Assessment & Plan Note (Signed)
She had prior history of MGUS Her last serum protein electrophoresis from November 2019 is stable We will continue to monitor that once a year.

## 2018-12-02 NOTE — Assessment & Plan Note (Signed)
She has mild, chronic intermittent thrombocytopenia, could be related to prior treatment She is not symptomatic Observe only

## 2018-12-09 ENCOUNTER — Ambulatory Visit: Payer: Medicare Other | Admitting: Internal Medicine

## 2018-12-28 ENCOUNTER — Ambulatory Visit (INDEPENDENT_AMBULATORY_CARE_PROVIDER_SITE_OTHER): Payer: Medicare Other | Admitting: Internal Medicine

## 2018-12-28 ENCOUNTER — Encounter: Payer: Self-pay | Admitting: Internal Medicine

## 2018-12-28 VITALS — BP 118/68 | HR 60 | Temp 98.5°F | Resp 12

## 2018-12-28 DIAGNOSIS — J019 Acute sinusitis, unspecified: Secondary | ICD-10-CM

## 2018-12-28 MED ORDER — AMOXICILLIN-POT CLAVULANATE 875-125 MG PO TABS: ORAL_TABLET | ORAL | 0 refills | Status: DC

## 2018-12-28 NOTE — Progress Notes (Signed)
    Subjective:    Patient ID: Erika Day, female   DOB: 05/07/1930, 85 y.o.   MRN: [id number]   HPI   Fever and a cough started maybe about 1.5 weeks ago.  Started taking Tylenol for the fevers and Mucinex for the cough.  After a few days, developed shortness of breath at night when lying down and would have to sit up.   Continues to cough up white phlegm, but gradually improving.  Still feels short of breath at times.   Chest is sore from coughing. Headache with coughing. Has a lot of posterior pharyngeal drainage.  Trying to drink water.   No nausea, vomiting or diarrhea.   No urinary complaints.   Legs with decreased swelling.    Current Meds  Medication Sig  . acetaminophen (TYLENOL) 325 MG tablet Take 2 tablets (650 mg total) by mouth every 4 (four) hours as needed for mild pain, moderate pain, fever or headache.  . cetirizine (ZYRTEC) 10 MG tablet 1 tab by mouth once daily with evening meal for allergies  . Cholecalciferol (VITAMIN D3) 1.25 MG (50000 UT) TABS Take 1 tablet by mouth once a week. on Saturday  . diclofenac sodium (VOLTAREN) 1 % GEL Apply 2 g topically 4 (four) times daily.  Water engineer Bandages & Supports (MEDICAL COMPRESSION STOCKINGS) MISC 20-30 mm Hg with toe thigh high graduated compression stockings on every morning and off at night.  . furosemide (LASIX) 20 MG tablet 1 tab by mouth once daily in morning  . guaiFENesin (MUCINEX) 600 MG 12 hr tablet Take by mouth 2 (two) times daily.  Marland Kitchen lisinopril (PRINIVIL,ZESTRIL) 20 MG tablet 1 tab by mouth every morning  . LOTEMAX 0.5 % ophthalmic suspension Place 1 drop into the left eye daily.  . pantoprazole (PROTONIX) 40 MG tablet 1 tab by mouth daily at 10 pm on empty stomach   Allergies  Allergen Reactions  . Morphine And Related Other (See Comments)    Blood sugar dropped one-time     Review of Systems    Objective:   BP 118/68 (BP Location: Left Arm, Patient Position: Sitting, Cuff Size: Normal)   Pulse  60   Temp 98.5 F (36.9 C) (Oral)   Resp 12   SpO2 97%   Physical Exam  NAD HEENT:  PERRL, EOMI, TMs pearly gray.  Throat without injection.  Tender over maxillary sinus areas.  Nasal mucosa swollen and red with yellow discharge. Neck:  Supple, No adenopathy Chest:  CTA CV:  RRR with systolic murmur unchanged.  Normal radial pulses. Abd:  S, NT, + BS LE:  Edema mild.   Assessment & Plan  Acute sinusitis:  Augmentin 875/125 mg twice daily for 10 days.   Push fluids.   Mucinex and Tylenol as needed. Hold Dulcolax until done with Augmentin

## 2018-12-28 NOTE — Patient Instructions (Signed)
Drink lots of water. Continue Mucinex and Tylenol as needed Hold on Dulcolax until done with Augmentin/antibiotic

## 2019-01-19 ENCOUNTER — Other Ambulatory Visit: Payer: Medicare Other

## 2019-01-20 ENCOUNTER — Other Ambulatory Visit: Payer: Self-pay

## 2019-01-20 ENCOUNTER — Other Ambulatory Visit: Payer: Medicare Other

## 2019-01-20 DIAGNOSIS — R269 Unspecified abnormalities of gait and mobility: Secondary | ICD-10-CM

## 2019-01-20 DIAGNOSIS — Z7409 Other reduced mobility: Secondary | ICD-10-CM

## 2019-01-20 DIAGNOSIS — H5461 Unqualified visual loss, right eye, normal vision left eye: Secondary | ICD-10-CM

## 2019-01-20 MED ORDER — OLOPATADINE HCL 0.2 % OP SOLN: OPHTHALMIC | 11 refills | Status: DC

## 2019-01-20 NOTE — Progress Notes (Signed)
Patient here for blood draw for CRP/Sed Rate/CBC for possible giant cell arteritis. Spoke with Dr. Midge Aver yesterday and patient is at low probability for this diagnosis. Was seen there yesterday for what she feels is sudden loss of vision in right eye and retinal arterioles were quite constricted, but Dr. Katy Fitch feels this is more so due to poorly controlled glaucoma as patient continues to not fill and use eye drops, nor does she follow up as recommended.   Had not been seen there in 2 years. Called pharmacy.  There is no cost to patient for Latanoprost.  They did not receive the Rx for Pataday. Will check with Dr. Zenia Resides office regarding the latter. I will pick up from pharmacy and drop off at patient's home to avoid having them out.  Erika Day shared also today that Erika Day's wheelchair broke and rigged the wheel back on but feels

## 2019-01-20 NOTE — Progress Notes (Deleted)
    Subjective:    Patient ID: Erika Day, female   DOB: 05-30-1930, 85 y.o.   MRN: [id number]   HPI  Current Meds  Medication Sig  . latanoprost (XALATAN) 0.005 % ophthalmic solution 1 drop at bedtime.  . Olopatadine HCl (PATADAY) 0.2 % SOLN Apply to eye daily. 1 drop each eye daily   Allergies  Allergen Reactions  . Morphine And Related Other (See Comments)    Blood sugar dropped one-time     Review of Systems    Objective:   There were no vitals taken for this visit.  Physical Exam   Assessment & Plan   ***

## 2019-01-21 LAB — CBC WITH DIFFERENTIAL/PLATELET
Basophils Absolute: 0 10*3/uL (ref 0.0–0.2)
Basos: 1 %
EOS (ABSOLUTE): 0.1 10*3/uL (ref 0.0–0.4)
Eos: 2 %
HEMOGLOBIN: 10.3 g/dL — AB (ref 11.1–15.9)
Hematocrit: 32.8 % — ABNORMAL LOW (ref 34.0–46.6)
Immature Grans (Abs): 0 10*3/uL (ref 0.0–0.1)
Immature Granulocytes: 0 %
LYMPHS ABS: 1.7 10*3/uL (ref 0.7–3.1)
Lymphs: 54 %
MCH: 27.3 pg (ref 26.6–33.0)
MCHC: 31.4 g/dL — ABNORMAL LOW (ref 31.5–35.7)
MCV: 87 fL (ref 79–97)
Monocytes Absolute: 0.2 10*3/uL (ref 0.1–0.9)
Monocytes: 8 %
Neutrophils Absolute: 1.1 10*3/uL — ABNORMAL LOW (ref 1.4–7.0)
Neutrophils: 35 %
Platelets: 160 10*3/uL (ref 150–450)
RBC: 3.77 x10E6/uL (ref 3.77–5.28)
RDW: 14.6 % (ref 11.7–15.4)
WBC: 3 10*3/uL — ABNORMAL LOW (ref 3.4–10.8)

## 2019-01-21 LAB — C-REACTIVE PROTEIN: CRP: 1 mg/L (ref 0–10)

## 2019-01-21 LAB — SEDIMENTATION RATE: Sed Rate: 18 mm/hr (ref 0–40)

## 2019-01-27 ENCOUNTER — Other Ambulatory Visit: Payer: Self-pay | Admitting: Internal Medicine

## 2019-02-06 ENCOUNTER — Telehealth: Payer: Self-pay | Admitting: Internal Medicine

## 2019-02-06 MED ORDER — BISACODYL 10 MG RE SUPP: 10.0000 mg | RECTAL | 1 refills | Status: DC | PRN

## 2019-02-06 NOTE — Telephone Encounter (Signed)
Not clear how much Miralax she is using, but tried what she states is the correct amount once daily, the twice daily without improvement.   She at last visit had not used the Miralax in some time.   Hard stool at rectum and tried suppository without improvement.   Cannot say what the name of the suppository--describes maybe a glycerin suppository.    Feels dulcolax suppositories work well for her and wondering if she can get a prescription.  Discussed not covered, but sent in generic and she will see if too expensive.

## 2019-03-02 ENCOUNTER — Inpatient Hospital Stay: Payer: Medicare Other | Attending: Hematology and Oncology | Admitting: Hematology and Oncology

## 2019-03-02 ENCOUNTER — Inpatient Hospital Stay: Payer: Medicare Other

## 2019-03-03 ENCOUNTER — Telehealth: Payer: Self-pay | Admitting: Hematology and Oncology

## 2019-03-03 NOTE — Telephone Encounter (Signed)
Called regarding scheduling an appointment, could not leave a voice mail for patient.

## 2019-04-25 ENCOUNTER — Telehealth: Payer: Self-pay

## 2019-04-25 NOTE — Telephone Encounter (Signed)
Spoke with patient. Informed per Dr. Amil Amen to take Miralax daily. Patient verbalized understanding and if she is not better by next week to call the office

## 2019-04-25 NOTE — Telephone Encounter (Signed)
Spoke with dr. Amil Amen regarding this. Per Dr. Amil Amen patient needs to take miralax . States this helped her before. Spoke with Mrs. Pant. States she has been taking the miralax just not everyday. States she takes it 3 times a week. Patient thought that she shouldn't take it everyday since she is getting watery stool at the end. Informed I would speak with Dr. Amil Amen and call her back.

## 2019-04-25 NOTE — Telephone Encounter (Signed)
Patient called stating she has been having constipation for 1 week. States she has been trying magnesium citrate and prune juice. States she is able to go a little bit but stool is hard but then she feels like she needs to go again and then she gets watery substance. States her stomach is hard. Patient denies any fever or any other symptoms.  To Dr. Amil Amen to advise.

## 2019-05-11 ENCOUNTER — Telehealth: Payer: Self-pay | Admitting: Internal Medicine

## 2019-05-11 NOTE — Telephone Encounter (Signed)
Lenice Pressman Nurse Practitioner from [facility] phone number  [phone] called stating Ms. Flippo has not picked up  Lisinopril and Pantoprazole since last filled on March 1st. Check on patient's chart and has  Rx on those meds until October 2020.  Please advise

## 2019-05-15 NOTE — Telephone Encounter (Signed)
Spoke with Vidal Schwalbe. States she filled all of Mrs.ewis medications and she has also ordered her reclining chair. States if she needs a letter from our office for the chair she will call back. This is just a [employer].

## 2019-06-09 ENCOUNTER — Telehealth: Payer: Self-pay

## 2019-06-09 NOTE — Telephone Encounter (Signed)
Patient called asking for something to stop diarrhea. States she has had diarrhea. X 3-4 days. Patient still has been taking her miralax daily with coffee. Per Dr. Amil Amen have patient hold miralax for a few days and see if that helps it. Spoke with patient informed of message from Dr. Amil Amen. Patient verbalized understanding and did say she took some immodium ad

## 2019-06-28 ENCOUNTER — Encounter (HOSPITAL_COMMUNITY): Payer: Self-pay

## 2019-06-28 ENCOUNTER — Emergency Department (HOSPITAL_COMMUNITY): Payer: Medicare Other

## 2019-06-28 ENCOUNTER — Other Ambulatory Visit: Payer: Self-pay

## 2019-06-28 ENCOUNTER — Observation Stay (HOSPITAL_COMMUNITY)
Admission: EM | Admit: 2019-06-28 | Discharge: 2019-06-29 | Disposition: A | Discharge status: Home / Self Care | Payer: Medicare Other | Attending: Student | Admitting: Student

## 2019-06-28 DIAGNOSIS — Z885 Allergy status to narcotic agent status: Secondary | ICD-10-CM | POA: Insufficient documentation

## 2019-06-28 DIAGNOSIS — C8584 Other specified types of non-Hodgkin lymphoma, lymph nodes of axilla and upper limb: Secondary | ICD-10-CM | POA: Diagnosis present

## 2019-06-28 DIAGNOSIS — I5033 Acute on chronic diastolic (congestive) heart failure: Secondary | ICD-10-CM | POA: Insufficient documentation

## 2019-06-28 DIAGNOSIS — D696 Thrombocytopenia, unspecified: Secondary | ICD-10-CM | POA: Diagnosis present

## 2019-06-28 DIAGNOSIS — Z8572 Personal history of non-Hodgkin lymphomas: Secondary | ICD-10-CM | POA: Insufficient documentation

## 2019-06-28 DIAGNOSIS — I509 Heart failure, unspecified: Secondary | ICD-10-CM | POA: Diagnosis present

## 2019-06-28 DIAGNOSIS — I130 Hypertensive heart and chronic kidney disease with heart failure and stage 1 through stage 4 chronic kidney disease, or unspecified chronic kidney disease: Secondary | ICD-10-CM | POA: Diagnosis not present

## 2019-06-28 DIAGNOSIS — Z1159 Encounter for screening for other viral diseases: Secondary | ICD-10-CM | POA: Insufficient documentation

## 2019-06-28 DIAGNOSIS — D472 Monoclonal gammopathy: Secondary | ICD-10-CM | POA: Diagnosis not present

## 2019-06-28 DIAGNOSIS — E1122 Type 2 diabetes mellitus with diabetic chronic kidney disease: Secondary | ICD-10-CM | POA: Diagnosis not present

## 2019-06-28 DIAGNOSIS — C8304 Small cell B-cell lymphoma, lymph nodes of axilla and upper limb: Secondary | ICD-10-CM | POA: Diagnosis present

## 2019-06-28 DIAGNOSIS — K219 Gastro-esophageal reflux disease without esophagitis: Secondary | ICD-10-CM | POA: Diagnosis not present

## 2019-06-28 DIAGNOSIS — N183 Chronic kidney disease, stage 3 (moderate): Secondary | ICD-10-CM | POA: Diagnosis not present

## 2019-06-28 DIAGNOSIS — Z79899 Other long term (current) drug therapy: Secondary | ICD-10-CM | POA: Diagnosis not present

## 2019-06-28 DIAGNOSIS — Z791 Long term (current) use of non-steroidal anti-inflammatories (NSAID): Secondary | ICD-10-CM | POA: Insufficient documentation

## 2019-06-28 DIAGNOSIS — D638 Anemia in other chronic diseases classified elsewhere: Secondary | ICD-10-CM | POA: Diagnosis present

## 2019-06-28 DIAGNOSIS — I10 Essential (primary) hypertension: Secondary | ICD-10-CM | POA: Diagnosis present

## 2019-06-28 DIAGNOSIS — M7989 Other specified soft tissue disorders: Secondary | ICD-10-CM

## 2019-06-28 DIAGNOSIS — D649 Anemia, unspecified: Secondary | ICD-10-CM | POA: Diagnosis present

## 2019-06-28 DIAGNOSIS — I272 Pulmonary hypertension, unspecified: Secondary | ICD-10-CM | POA: Insufficient documentation

## 2019-06-28 DIAGNOSIS — D61818 Other pancytopenia: Secondary | ICD-10-CM | POA: Diagnosis not present

## 2019-06-28 DIAGNOSIS — Z8673 Personal history of transient ischemic attack (TIA), and cerebral infarction without residual deficits: Secondary | ICD-10-CM | POA: Insufficient documentation

## 2019-06-28 DIAGNOSIS — E785 Hyperlipidemia, unspecified: Secondary | ICD-10-CM | POA: Diagnosis not present

## 2019-06-28 DIAGNOSIS — Z87891 Personal history of nicotine dependence: Secondary | ICD-10-CM | POA: Diagnosis not present

## 2019-06-28 LAB — CBC WITH DIFFERENTIAL/PLATELET
Abs Immature Granulocytes: 0.01 10*3/uL (ref 0.00–0.07)
Basophils Absolute: 0 10*3/uL (ref 0.0–0.1)
Basophils Relative: 1 %
Eosinophils Absolute: 0 10*3/uL (ref 0.0–0.5)
Eosinophils Relative: 1 %
HCT: 33.8 % — ABNORMAL LOW (ref 36.0–46.0)
Hemoglobin: 10.3 g/dL — ABNORMAL LOW (ref 12.0–15.0)
Immature Granulocytes: 0 %
Lymphocytes Relative: 43 %
Lymphs Abs: 1.2 10*3/uL (ref 0.7–4.0)
MCH: 29 pg (ref 26.0–34.0)
MCHC: 30.5 g/dL (ref 30.0–36.0)
MCV: 95.2 fL (ref 80.0–100.0)
Monocytes Absolute: 0.2 10*3/uL (ref 0.1–1.0)
Monocytes Relative: 9 %
Neutro Abs: 1.3 10*3/uL — ABNORMAL LOW (ref 1.7–7.7)
Neutrophils Relative %: 46 %
Platelets: 114 10*3/uL — ABNORMAL LOW (ref 150–400)
RBC: 3.55 MIL/uL — ABNORMAL LOW (ref 3.87–5.11)
RDW: 13.1 % (ref 11.5–15.5)
WBC: 2.8 10*3/uL — ABNORMAL LOW (ref 4.0–10.5)
nRBC: 0 % (ref 0.0–0.2)

## 2019-06-28 LAB — BASIC METABOLIC PANEL
Anion gap: 10 (ref 5–15)
BUN: 19 mg/dL (ref 8–23)
CO2: 21 mmol/L — ABNORMAL LOW (ref 22–32)
Calcium: 9 mg/dL (ref 8.9–10.3)
Chloride: 108 mmol/L (ref 98–111)
Creatinine, Ser: 1.53 mg/dL — ABNORMAL HIGH (ref 0.44–1.00)
GFR calc Af Amer: 35 mL/min — ABNORMAL LOW (ref >60)
GFR calc non Af Amer: 30 mL/min — ABNORMAL LOW (ref >60)
Glucose, Bld: 96 mg/dL (ref 70–99)
Potassium: 4.2 mmol/L (ref 3.5–5.1)
Sodium: 139 mmol/L (ref 135–145)

## 2019-06-28 LAB — BRAIN NATRIURETIC PEPTIDE: B Natriuretic Peptide: 275.5 pg/mL — ABNORMAL HIGH (ref 0.0–100.0)

## 2019-06-28 MED ORDER — FUROSEMIDE 10 MG/ML IJ SOLN
40.0000 mg | Freq: Once | INTRAMUSCULAR | Status: AC
  Administered 2019-06-29: 01:00:00 40 mg via INTRAVENOUS
  Filled 2019-06-28: qty 4

## 2019-06-28 NOTE — ED Provider Notes (Signed)
[place] EMERGENCY DEPARTMENT Provider Note   CSN: [id number] Arrival date & time: 06/28/19  1628     History   Chief Complaint Chief Complaint  Patient presents with  . Shortness of Breath    HPI Erika Day is a 85 y.o. female.     The history is provided by the patient and medical records. No language interpreter was used.  Shortness of Breath    85 year old female with history of lower extremity edema, diabetes, CLL, hypertension, presenting to the ED via EMS for evaluation of shortness of breath.  Patient reports she normally scheduled for breath when her house is hot.  She believes someone accidentally turned on the heat as compared to the air today and her house was very hot this caused her to feel out of breath.  She endorsed shortness of breath while she was resting.  Once arrived to the ED and in a cool setting she report feeling much better.  She denies any associated fever chills no productive cough no chest pain.  She denies any nausea vomiting diarrhea abdominal pain or back pain.  She denies any recent sick contact with anyone with COVID-19.  She does have bilateral leg swelling for the past several years and attributed to prior hip and ankle injury.  She denies increasing leg swelling.  No prior history of PE or DVT.  When mention about history of cancer patient states that she does not know that she has cancer.  She denies any active chest pain.  Past Medical History:  Diagnosis Date  . Acid reflux disease   . Cancer ([place])   . CLL (chronic lymphocytic leukemia) ([place]) 05/15/2016  . DDD (degenerative disc disease), lumbar   . Degenerative joint disease   . Diabetes mellitus without complication [facility])    Patient states this was a misdiagnosis from years ago  . Dyslipidemia   . History of diabetes mellitus 04/19/2017  . Hypertension   . 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 Panic attacks   . Pneumonia 02/2016  . 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 Stroke Procedure [facility]) age 38 yo   poorly controlled DM and Hypertension    Patient Active Problem List   Diagnosis Date Noted  . Small bowel obstruction ([place]) 04/08/2018  . MGUS (monoclonal gammopathy of unknown significance) 02/11/2018  . History of diabetes mellitus 04/19/2017  . Hypercalcemia 02/12/2017  . Chronic leukopenia 02/12/2017  . Postoperative anemia due to acute blood loss 11/26/2016  . Fall 11/23/2016  . Closed fracture of intertrochanteric section of femur ([place]) 11/23/2016  . Other pancytopenia ([place]) 11/13/2016  . Sinusitis 10/14/2016  . Bleeding external hemorrhoids 08/20/2016  . Recurrent ventral incisional hernia s/p primary repair 06/30/2016 07/01/2016  . Pressure ulcer 05/20/2016  . Lower extremity edema 05/20/2016  . Protein-calorie malnutrition, severe ([place]) 05/20/2016  . Volvulus of sigmoid colon ([place])   . Sigmoid volvulus s/p lap sigmoid colectomy 06/30/2016 05/19/2016  . 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 Other fatigue 05/15/2016  . CAP (community acquired pneumonia) 02/13/2016  . Anemia, chronic disease 02/13/2016  . Thrombocytopenia ([place]) 02/13/2016  . Hyponatremia 02/13/2016  . Kyphosis 02/01/2016  . Pressure ulcer of coccygeal region 01/31/2016  . Mobility impaired 01/31/2016  . Osteoarthritis 01/31/2016  . Abdominal cramping 04/23/2014  . Essential hypertension 09/06/2011  . Ovarian torsion 08/25/2011  Past Surgical History:  Procedure Laterality Date  . ABDOMINAL HYSTERECTOMY  ? 08/25/2011 ?   Has BSO for left benign ovarian tumor with torsion and broad ligament benign tumor, but does not appear had hysterectomy--[facility], NOT ovarian cancer  . CHOLECYSTECTOMY    . FEMUR IM NAIL Left 11/23/2016   Procedure: INTRAMEDULLARY (IM) NAIL FEMORAL;  Surgeon: Gaynelle Arabian, MD;  Location: [facility];  Service: Orthopedics;   Laterality: Left;  . FLEXIBLE SIGMOIDOSCOPY N/A 05/19/2016   Procedure: FLEXIBLE SIGMOIDOSCOPY;  Surgeon: [name], MD;  Location: [facility];  Service: Endoscopy;  Laterality: N/A;  . FLEXIBLE SIGMOIDOSCOPY N/A 06/28/2016   Procedure: FLEXIBLE SIGMOIDOSCOPY;  Surgeon: Ladene Artist, MD;  Location: [facility];  Service: Endoscopy;  Laterality: N/A;  . LAPAROSCOPIC SIGMOID COLECTOMY N/A 06/30/2016   Procedure: LAPAROSCOPIC SIGMOID COLECTOMY,rigid sigmoidoscopy, repair recurrent incisional hernia;  Surgeon: Michael [place], MD;  Location: [facility];  Service: General;  Laterality: N/A;  . LAPAROTOMY N/A 04/29/2014   Procedure: EXPLORATORY LAPAROTOMY;  Surgeon: Imogene Burn. Georgette Dover, MD;  Location: [facility];  Service: General;  Laterality: N/A;  . LAPAROTOMY N/A 04/16/2018   Procedure: EXPLORATORY LAPAROTOMY WITH LYSIS OF ADHESIONS;  Surgeon: Armandina Gemma, MD;  Location: [facility];  Service: General;  Laterality: N/A;  . LYSIS OF ADHESION N/A 04/29/2014   Procedure: EXTENSIVE LYSIS OF ADHESIONS;  Surgeon: Imogene Burn. Georgette Dover, MD;  Location: [facility] [place];  Service: General;  Laterality: N/A;  . VENTRAL HERNIA REPAIR N/A 04/29/2014   Procedure: PRIMARY REPAIR OF VENTRAL HERNIA;  Surgeon: Imogene Burn. Georgette Dover, MD;  Location: [place] OR;  Service: General;  Laterality: N/A;     OB History   No obstetric history on file.      Home Medications    Prior to Admission medications   Medication Sig Start Date End Date Taking? Authorizing Provider  acetaminophen (TYLENOL) 325 MG tablet Take 2 tablets (650 mg total) by mouth every 4 (four) hours as needed for mild pain, moderate pain, fever or headache. 04/22/18   Arrien, Jimmy Picket, MD  bisacodyl (DULCOLAX) 10 MG suppository Place 1 suppository (10 mg total) rectally as needed for moderate constipation. 02/06/19   Mack Hook, MD  cetirizine (ZYRTEC) 10 MG tablet 1 tab by mouth once daily with evening meal for allergies 11/16/18   Mack Hook, MD  Cholecalciferol  (VITAMIN D3) 1.25 MG (50000 UT) TABS Take 1 tablet by mouth once a week. on Saturday 11/16/18   Mack Hook, MD  diclofenac sodium (VOLTAREN) 1 % GEL Apply 2 g topically 4 (four) times daily. 11/16/18   Mack Hook, MD  dicyclomine (BENTYL) 10 MG capsule 1 cap by mouth every 6 hours as needed for abdominal cramping. Patient not taking: Reported on 11/03/2018 05/25/18   Mack Hook, MD  Elastic Bandages & Supports (MEDICAL COMPRESSION STOCKINGS) MISC 20-30 mm Hg with toe thigh high graduated compression stockings on every morning and off at night. 11/05/17   Mack Hook, MD  furosemide (LASIX) 20 MG tablet 1 tab by mouth once daily in morning 11/16/18   Mack Hook, MD  guaiFENesin (MUCINEX) 600 MG 12 hr tablet Take by mouth 2 (two) times daily.    [provider]  latanoprost (XALATAN) 0.005 % ophthalmic solution 1 drop at bedtime.    [provider]  lisinopril (PRINIVIL,ZESTRIL) 20 MG tablet 1 tab by mouth every morning 11/16/18   Mack Hook, MD  LOTEMAX 0.5 % ophthalmic suspension Place 1 drop into the left eye daily. 05/25/18  Mack Hook, MD  Olopatadine HCl (PATADAY) 0.2 % SOLN 1 drop each eye daily 01/20/19   Mack Hook, MD  ondansetron (ZOFRAN-ODT) 4 MG disintegrating tablet Take 1 tablet (4 mg total) by mouth every 8 (eight) hours as needed for nausea or vomiting. Patient not taking: Reported on 11/03/2018 10/19/18   Mack Hook, MD  pantoprazole (PROTONIX) 40 MG tablet 1 tab by mouth daily at 10 pm on empty stomach 11/16/18   Mack Hook, MD  polyethylene glycol powder (GLYCOLAX/MIRALAX) powder Mix 17 g powder in 8 oz of water and drink once daily in the morning Patient not taking: Reported on 12/28/2018 11/16/18   Mack Hook, MD  senna-docusate (SENOKOT-S) 8.6-50 MG tablet Take 2 tablets by mouth at bedtime as needed for mild constipation. Patient not taking: Reported on 11/16/2018 05/25/18    Mack Hook, MD  traMADol (ULTRAM) 50 MG tablet TAKE 1 TABLET BY MOUTH EVERY 8 HOURS AS NEEDED 01/27/19   Mack Hook, MD    Family History Family History  Problem Relation Age of Onset  . Hypertension Mother   . Stroke Mother   . Arthritis Mother   . Alcohol abuse Father     Social History Social History   Tobacco Use  . Smoking status: Former Smoker    Types: Cigarettes    Quit date: 02/18/1990    Years since quitting: 29.3  . Smokeless tobacco: Former Systems developer    Types: Snuff    Quit date: 02/18/1990  Substance Use Topics  . Alcohol use: No    Alcohol/week: 0.0 standard drinks    Comment: Only drank alcohol between ages of 63 and 33 yo.  Her father gave it to her  . Drug use: No     Allergies   Morphine and related   Review of Systems Review of Systems  Respiratory: Positive for shortness of breath.   All other systems reviewed and are negative.    Physical Exam Updated Vital Signs BP (!) 164/61   Pulse 65   Temp 97.6 F (36.4 C) (Oral)   Resp 12   Ht 5\' 2"  (1.575 m)   Wt 61.7 kg   SpO2 100%   BMI 24.87 kg/m   Physical Exam Vitals signs and nursing note reviewed.  Constitutional:      General: She is not in acute distress.    Appearance: She is well-developed.  HENT:     Head: Atraumatic.  Eyes:     Conjunctiva/sclera: Conjunctivae normal.  Neck:     Musculoskeletal: Neck supple.     Vascular: No JVD.  Cardiovascular:     Rate and Rhythm: Normal rate and regular rhythm.     Heart sounds: Murmur present.  Pulmonary:     Effort: Pulmonary effort is normal.     Breath sounds: Normal breath sounds. No decreased breath sounds, wheezing, rhonchi or rales.  Chest:     Chest wall: No tenderness.  Abdominal:     Palpations: Abdomen is soft.     Tenderness: There is no abdominal tenderness.  Musculoskeletal:     Right lower leg: Edema present.     Left lower leg: Edema present.     Comments: 2+ pitting edema to bilateral lower  extremity extending towards the knee.  Skin:    Capillary Refill: Capillary refill takes less than 2 seconds.     Findings: No rash.  Neurological:     General: No focal deficit present.     Mental Status: She is alert.  Psychiatric:        Mood and Affect: Mood normal.      ED Treatments / Results  Labs (all labs ordered are listed, but only abnormal results are displayed) Labs Reviewed  BASIC METABOLIC PANEL - Abnormal; Notable for the following components:      Result Value   CO2 21 (*)    Creatinine, Ser 1.53 (*)    GFR calc non Af Amer 30 (*)    GFR calc Af Amer 35 (*)    All other components within normal limits  CBC WITH DIFFERENTIAL/PLATELET - Abnormal; Notable for the following components:   WBC 2.8 (*)    RBC 3.55 (*)    Hemoglobin 10.3 (*)    HCT 33.8 (*)    Platelets 114 (*)    Neutro Abs 1.3 (*)    All other components within normal limits  BRAIN NATRIURETIC PEPTIDE - Abnormal; Notable for the following components:   B Natriuretic Peptide 275.5 (*)    All other components within normal limits    EKG None  ED ECG REPORT   Date: 06/28/2019  Rate: 60  Rhythm: normal sinus rhythm  QRS Axis: left  Intervals: normal  ST/T Wave abnormalities: nonspecific ST/T changes  Conduction Disutrbances:none  Narrative Interpretation:   Old EKG Reviewed: unchanged  I have personally reviewed the EKG tracing and agree with the computerized printout as noted.   Radiology Dg Chest Port 1 View  Result Date: 06/28/2019 CLINICAL DATA:  Shortness of breath, pitting edema EXAM: PORTABLE CHEST 1 VIEW COMPARISON:  Radiograph April 22, 2018, CT chest 02/13/2016 FINDINGS: Chronic elevation the right hemidiaphragm with adjacent hazy opacity likely reflecting atelectasis. The vascularity is cephalized indistinct some central vascular cuffing and peripheral septal lines. Included portions of the cardiac silhouette appears enlarged. The aorta is calcified and tortuous. No left  effusion is seen. No pneumothorax. Degenerative changes are present in the and imaged spine and shoulders. IMPRESSION: 1. Mild pulmonary edema. 2. Chronic elevation of the right hemidiaphragm with adjacent hazy opacity likely reflecting atelectasis. Underlying pneumonia or aspiration is not excluded. Electronically Signed   By: Lovena Le M.D.   On: 06/28/2019 18:41   Vas [place] Lower Extremity Venous (dvt) (only Mc & Wl 7a-7p)  Result Date: 06/28/2019  Lower Venous Study Indications: Swelling.  Limitations: Body habitus, poor ultrasound/tissue interface and restricted mobility. Comparison Study: No recent prior study. Performing Technologist: Maudry Mayhew MHA, RDMS, RVT, RDCS  Examination Guidelines: A complete evaluation includes B-mode imaging, spectral Doppler, color Doppler, and power Doppler as needed of all accessible portions of each vessel. Bilateral testing is considered an integral part of a complete examination. Limited examinations for reoccurring indications may be performed as noted.  +---------+---------------+---------+-----------+----------+--------------+ RIGHT    CompressibilityPhasicitySpontaneityPropertiesThrombus Aging +---------+---------------+---------+-----------+----------+--------------+ CFV      Full           Yes      Yes                                 +---------+---------------+---------+-----------+----------+--------------+ SFJ      Full                                                        +---------+---------------+---------+-----------+----------+--------------+ FV Prox  Full                                                        +---------+---------------+---------+-----------+----------+--------------+  FV Mid   Full                                                        +---------+---------------+---------+-----------+----------+--------------+ FV DistalFull                                                         +---------+---------------+---------+-----------+----------+--------------+ POP      Full                                                        +---------+---------------+---------+-----------+----------+--------------+ Unable to visualize right profunda femoral, posterior tibial, and peroneal veins. Unable to obtain right popliteal vein pulsed wave signal due to patient position.  +-------+---------------+---------+-----------+----------+--------------+ LEFT   CompressibilityPhasicitySpontaneityPropertiesThrombus Aging +-------+---------------+---------+-----------+----------+--------------+ CFV    Full           No       Yes                                 +-------+---------------+---------+-----------+----------+--------------+ FV ProxFull                                                        +-------+---------------+---------+-----------+----------+--------------+ FV Mid Full                                                        +-------+---------------+---------+-----------+----------+--------------+ Unable to visualize left profunda femoral, distal femoral, popliteal, posterior tibial, and peroneal veins. Pulsatile venous flow.    Summary: Right: There is no evidence of deep vein thrombosis in the lower extremity. However, portions of this examination were limited- see technologist comments above. No cystic structure found in the popliteal fossa. Left: There is no evidence of deep vein thrombosis in the lower extremity. However, portions of this examination were limited- see technologist comments above. No cystic structure found in the popliteal fossa.  *See table(s) above for measurements and observations.    Preliminary     Procedures Procedures (including critical care time)  Medications Ordered in ED Medications  furosemide (LASIX) injection 40 mg (has no administration in time range)     Initial Impression / Assessment and Plan / ED Course  I have  reviewed the triage vital signs and the nursing notes.  Pertinent labs & imaging results that were available during my care of the patient were reviewed by me and considered in my medical decision making (see chart for details).        BP (!) 176/71 (BP Location: Left Arm)   Pulse 61   Temp 97.6 F (36.4 C) (Oral)   Resp 14  Ht 5\' 2"  (1.575 m)   Wt 61.7 kg   SpO2 97%   BMI 24.87 kg/m    Final Clinical Impressions(s) / ED Diagnoses   Final diagnoses:  Acute on chronic congestive heart failure, unspecified heart failure type [facility])    ED Discharge Orders    None     5:03 PM Patient lives at home with her husband.  She is here with shortness of breath and she attributes to the heat.  She endorsed shortness of breath since resolved from now that she is in a cool room.  She does appears to have bilateral peripheral edema, history of such.  She does not have any cold symptoms.  Her lungs are clear on exam.  We will compensated, will obtain venous Doppler study to rule out DVT.  May consider chest CT angiogram to rule out PE if CXR is completely normal.  Care discussed with DR. Dykstra.   7:46 PM Patient's daughter is now in the room and able to provide more history.  Patient has been complaining of intermittent shortness of breath ongoing for the past 2 weeks worse today.  She also felt that her leg swelling has increased in size.  Today her labs shows mild AKI with creatinine of 1.53 likely secondary to regular Lasix use.  Her BNP is 275, and her chest x-ray shows mild pulmonary edema.  Underlying pneumonia or aspiration is not excluded however patient denies having any productive cough or fever to support a picture of pneumonia.  COVID-19 testing has been ordered.  DVT study without evidence of acute DVT however this exam is limited.  Given her age, and evidence of CHF exacerbation I discussed with Dr. Roslynn Amble and our plan is to consult medicine for admission.  Lasix given.  Last  cardiac echo in 2017 showed an EF of 60-65%.  8:13 PM Appreciate consultation from [facility] Dr. Hal Hope who agrees to see and admit pt for CHF exacerbation.    Domenic Moras, PA-C 06/28/19 2014    Lucrezia Starch, MD 07/01/19 [phone]

## 2019-06-28 NOTE — ED Triage Notes (Signed)
Pt brought in by EMS with c/o shortness of breath that started today.  Pt states she gets short of breath when she gets too hot.  Denies any cough.  Pt does have bilateral pitting edema to her lower extremities.  Pt is alert and oriented x4

## 2019-06-28 NOTE — Progress Notes (Signed)
Bilateral lower extremity venous duplex completed. Refer to "CV Proc" under chart review to view preliminary results.  06/28/2019 5:53 PM Maudry Mayhew, MHA, RVT, RDCS, RDMS

## 2019-06-28 NOTE — ED Notes (Signed)
ED TO INPATIENT HANDOFF REPORT  ED Nurse Name and Phone #: [phone] Josselin Gaulin  S Name/Age/Gender [name] 85 y.o. female Room/Bed: 027C/027C  Code Status   Code Status: Prior  Home/SNF/Other Home Patient oriented to: self, place, time and situation Is this baseline? Yes   Triage Complete: Triage complete  Chief Complaint SOB  Triage Note Pt brought in by EMS with c/o shortness of breath that started today.  Pt states she gets short of breath when she gets too hot.  Denies any cough.  Pt does have bilateral pitting edema to her lower extremities.  Pt is alert and oriented x4   Allergies Allergies  Allergen Reactions  . Morphine And Related Other (See Comments)    Blood sugar dropped one-time    Level of Care/Admitting Diagnosis ED Disposition    ED Disposition Condition Comment   Admit  Hospital Area: [facility] [[id number]]  Level of Care: Telemetry Cardiac [103]  Covid Evaluation: Asymptomatic Screening Protocol (No Symptoms)  Diagnosis: [facility]congestive heart failure) [id number]  Admitting Physician: Rise Patience [phone]  Attending Physician: Rise Patience [phone]  Estimated length of stay: past midnight tomorrow  Certification:: I certify this patient will need inpatient services for at least 2 midnights  PT Class (Do Not Modify): Inpatient [101]  PT Acc Code (Do Not Modify): Private [1]       B Medical/Surgery History Past Medical History:  Diagnosis Date  . Acid reflux disease   . Cancer ([place])   . CLL (chronic lymphocytic leukemia) ([place]) 05/15/2016  . DDD (degenerative disc disease), lumbar   . Degenerative joint disease   . Diabetes mellitus without complication [facility])    Patient states this was a misdiagnosis from years ago  . Dyslipidemia   . History of diabetes mellitus 04/19/2017  . Hypertension   . 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 Panic attacks   . Pneumonia 02/2016  . 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 Stroke [facility]) age 82 yo   poorly controlled DM and Hypertension   Past Surgical History:  Procedure Laterality Date  . ABDOMINAL HYSTERECTOMY  ? 08/25/2011 ?   Has BSO for left benign ovarian tumor with torsion and broad ligament benign tumor, but does not appear had hysterectomy--[facility], NOT ovarian cancer  . CHOLECYSTECTOMY    . FEMUR IM NAIL Left 11/23/2016   Procedure: INTRAMEDULLARY (IM) NAIL FEMORAL;  Surgeon: Gaynelle Arabian, MD;  Location: [facility];  Service: Orthopedics;  Laterality: Left;  . FLEXIBLE SIGMOIDOSCOPY N/A 05/19/2016   Procedure: FLEXIBLE SIGMOIDOSCOPY;  Surgeon: [name], MD;  Location[facility];  Service: Endoscopy;  Laterality: N/A;  . FLEXIBLE SIGMOIDOSCOPY N/A 06/28/2016   Procedure: FLEXIBLE SIGMOIDOSCOPY;  Surgeon: Ladene Artist, MD;  Location[facility];  Service: Endoscopy;  Laterality: N/A;  . LAPAROSCOPIC SIGMOID COLECTOMY N/A 06/30/2016   Procedure: LAPAROSCOPIC SIGMOID COLECTOMY,rigid sigmoidoscopy, repair recurrent incisional hernia;  Surgeon: Michael Boston, MD;  Location: [facility];  Service: General;  Laterality: N/A;  . LAPAROTOMY N/A 04/29/2014   Procedure: EXPLORATORY LAPAROTOMY;  Surgeon: Imogene Burn. Georgette Dover, MD;  Location: [place];  Service: General;  Laterality: N/A;  . LAPAROTOMY N/A 04/16/2018   Procedure: EXPLORATORY LAPAROTOMY WITH LYSIS OF ADHESIONS;  Surgeon: Armandina Gemma, MD;  Location: [facility];  Service: General;  Laterality: N/A;  . LYSIS OF ADHESION N/A 04/29/2014   Procedure: EXTENSIVE LYSIS OF ADHESIONS;  Surgeon: Imogene Burn. Tsuei, MD;  Location: [place];  Service: General;  Laterality: N/A;  . VENTRAL HERNIA REPAIR N/A 04/29/2014   Procedure: PRIMARY REPAIR OF VENTRAL HERNIA;  Surgeon: Imogene Burn. Tsuei, MD;  Location: [place];  Service: General;  Laterality: N/A;     A IV Location/Drains/Wounds Patient Lines/Drains/Airways Status   Active Line/Drains/Airways    Name:   Placement date:   Placement  time:   Site:   Days:   Peripheral IV 06/28/19 Right Antecubital   06/28/19    1817    Antecubital   less than 1   Incision (Closed) 11/23/16 Hip Left   11/23/16    2111     947   Incision (Closed) 04/16/18 Abdomen Other (Comment)   04/16/18    1205     438   Incision - 3 Ports Abdomen Right;Lateral;Upper Right;Lateral;Mid Right;Lateral;Lower   06/30/16    1108     1093   Pressure Ulcer 05/19/16 Unstageable - Full thickness tissue loss in which the base of the ulcer is covered by slough (yellow, tan, gray, green or brown) and/or eschar (tan, brown or black) in the wound bed.   05/19/16    1600     1135          Intake/Output Last 24 hours No intake or output data in the 24 hours ending 06/28/19 2105  Labs/Imaging Results for orders placed or performed during the hospital encounter of 06/28/19 (from the past 48 hour(s))  Brain natriuretic peptide     Status: Abnormal   Collection Time: 06/28/19  4:42 PM  Result Value Ref Range   B Natriuretic Peptide 275.5 (H) 0.0 - 100.0 pg/mL    Comment: Performed at [facility], [address], [place], [place] [id number]  Basic metabolic panel     Status: Abnormal   Collection Time: 06/28/19  5:54 PM  Result Value Ref Range   Sodium 139 135 - 145 mmol/L   Potassium 4.2 3.5 - 5.1 mmol/L   Chloride 108 98 - 111 mmol/L   CO2 21 (L) 22 - 32 mmol/L   Glucose, Bld 96 70 - 99 mg/dL   BUN 19 8 - 23 mg/dL   Creatinine, Ser 1.53 (H) 0.44 - 1.00 mg/dL   Calcium 9.0 8.9 - 10.3 mg/dL   GFR calc non Af Amer 30 (L) >60 mL/min   GFR calc Af Amer 35 (L) >60 mL/min   Anion gap 10 5 - 15    Comment: Performed at [facility], [place] [address] [place], [place] [address]  CBC with Differential     Status: Abnormal   Collection Time: 06/28/19  5:54 PM  Result Value Ref Range   WBC 2.8 (L) 4.0 - 10.5 K/uL   RBC 3.55 (L) 3.87 - 5.11 MIL/uL   Hemoglobin 10.3 (L) 12.0 - 15.0 g/dL   HCT 33.8 (L) 36.0 - 46.0 %   MCV 95.2 80.0 - 100.0 fL   MCH 29.0 26.0  - 34.0 pg   MCHC 30.5 30.0 - 36.0 g/dL   RDW 13.1 11.5 - 15.5 %   Platelets 114 (L) 150 - 400 K/uL    Comment: REPEATED TO VERIFY Immature Platelet Fraction may be clinically indicated, consider ordering this additional test [id number] PLATELET COUNT CONFIRMED BY SMEAR    nRBC 0.0 0.0 - 0.2 %   Neutrophils Relative % 46 %   Neutro Abs 1.3 (L) 1.7 - 7.7 K/uL   Lymphocytes Relative 43 %   Lymphs  Abs 1.2 0.7 - 4.0 K/uL   Monocytes Relative 9 %   Monocytes Absolute 0.2 0.1 - 1.0 K/uL   Eosinophils Relative 1 %   Eosinophils Absolute 0.0 0.0 - 0.5 K/uL   Basophils Relative 1 %   Basophils Absolute 0.0 0.0 - 0.1 K/uL   WBC Morphology See Note     Comment: >10% reactive, Benign Lymphocytes.   Immature Granulocytes 0 %   Abs Immature Granulocytes 0.01 0.00 - 0.07 K/uL   Polychromasia PRESENT    Ovalocytes PRESENT     Comment: Performed at [facility], [place] [address], [place], [place] [address]   Dg Chest Port 1 View  Result Date: 06/28/2019 CLINICAL DATA:  Shortness of breath, pitting edema EXAM: PORTABLE CHEST 1 VIEW COMPARISON:  Radiograph April 22, 2018, CT chest 02/13/2016 FINDINGS: Chronic elevation the right hemidiaphragm with adjacent hazy opacity likely reflecting atelectasis. The vascularity is cephalized indistinct some central vascular cuffing and peripheral septal lines. Included portions of the cardiac silhouette appears enlarged. The aorta is calcified and tortuous. No left effusion is seen. No pneumothorax. Degenerative changes are present in the and imaged spine and shoulders. IMPRESSION: 1. Mild pulmonary edema. 2. Chronic elevation of the right hemidiaphragm with adjacent hazy opacity likely reflecting atelectasis. Underlying pneumonia or aspiration is not excluded. Electronically Signed   By: Lovena Le M.D.   On: 06/28/2019 18:41   Vas [place] Lower Extremity Venous (dvt) (only Mc & Wl 7a-7p)  Result Date: 06/28/2019  Lower Venous Study Indications: Swelling.   Limitations: Body habitus, poor ultrasound/tissue interface and restricted mobility. Comparison Study: No recent prior study. Performing Technologist: Maudry Mayhew MHA, RDMS, RVT, RDCS  Examination Guidelines: A complete evaluation includes B-mode imaging, spectral Doppler, color Doppler, and power Doppler as needed of all accessible portions of each vessel. Bilateral testing is considered an integral part of a complete examination. Limited examinations for reoccurring indications may be performed as noted.  +---------+---------------+---------+-----------+----------+--------------+ RIGHT    CompressibilityPhasicitySpontaneityPropertiesThrombus Aging +---------+---------------+---------+-----------+----------+--------------+ CFV      Full           Yes      Yes                                 +---------+---------------+---------+-----------+----------+--------------+ SFJ      Full                                                        +---------+---------------+---------+-----------+----------+--------------+ FV Prox  Full                                                        +---------+---------------+---------+-----------+----------+--------------+ FV Mid   Full                                                        +---------+---------------+---------+-----------+----------+--------------+ FV DistalFull                                                        +---------+---------------+---------+-----------+----------+--------------+  POP      Full                                                        +---------+---------------+---------+-----------+----------+--------------+ Unable to visualize right profunda femoral, posterior tibial, and peroneal veins. Unable to obtain right popliteal vein pulsed wave signal due to patient position.  +-------+---------------+---------+-----------+----------+--------------+ LEFT    CompressibilityPhasicitySpontaneityPropertiesThrombus Aging +-------+---------------+---------+-----------+----------+--------------+ CFV    Full           No       Yes                                 +-------+---------------+---------+-----------+----------+--------------+ FV ProxFull                                                        +-------+---------------+---------+-----------+----------+--------------+ FV Mid Full                                                        +-------+---------------+---------+-----------+----------+--------------+ Unable to visualize left profunda femoral, distal femoral, popliteal, posterior tibial, and peroneal veins. Pulsatile venous flow.    Summary: Right: There is no evidence of deep vein thrombosis in the lower extremity. However, portions of this examination were limited- see technologist comments above. No cystic structure found in the popliteal fossa. Left: There is no evidence of deep vein thrombosis in the lower extremity. However, portions of this examination were limited- see technologist comments above. No cystic structure found in the popliteal fossa.  *See table(s) above for measurements and observations.    Preliminary     Pending Labs Unresulted Labs (From admission, onward)    Start     Ordered   06/28/19 1905  SARS CORONAVIRUS 2 (TAT 6-12 HRS) Nasal Swab Aptima Multi Swab  (Asymptomatic/Tier 2 Patients Labs)  Once,   STAT    Question Answer Comment  Is this test for diagnosis or screening Screening   Symptomatic for COVID-19 as defined by CDC No   Hospitalized for COVID-19 No   Admitted to ICU for COVID-19 No   Previously tested for COVID-19 No   Resident in a congregate (group) care setting No   Employed in healthcare setting No   Pregnant No      06/28/19 1904          Vitals/Pain Today's Vitals   06/28/19 1646 06/28/19 1915 06/28/19 1930 06/28/19 2004  BP:  (!) 153/74 (!) 164/61 (!) 176/71  Pulse:  61  65 61  Resp:  18 12 14   Temp:   97.6 F (36.4 C) 97.6 F (36.4 C)  TempSrc:   Oral Oral  SpO2:  100% 100% 97%  Weight: 61.7 kg     Height: 5\' 2"  (1.575 m)     PainSc:        Isolation Precautions No active isolations  Medications Medications  furosemide (LASIX) injection 40 mg (has no administration in time range)  Mobility manual wheelchair High fall risk   Focused Assessments Pulmonary Assessment Handoff:  Lung sounds: Bilateral Breath Sounds: Clear L Breath Sounds: Clear R Breath Sounds: Clear O2 Device: Room Air        R Recommendations: See Admitting Provider Note  Report given to:   Additional Notes: 20g IV RAC done by IV team.  Bilateral lower extremity pitting edema.  Alert and oriented x4.

## 2019-06-29 ENCOUNTER — Inpatient Hospital Stay (HOSPITAL_COMMUNITY): Payer: Medicare Other

## 2019-06-29 ENCOUNTER — Encounter (HOSPITAL_COMMUNITY): Payer: Self-pay | Admitting: Internal Medicine

## 2019-06-29 DIAGNOSIS — I509 Heart failure, unspecified: Secondary | ICD-10-CM

## 2019-06-29 DIAGNOSIS — I5031 Acute diastolic (congestive) heart failure: Secondary | ICD-10-CM | POA: Diagnosis not present

## 2019-06-29 DIAGNOSIS — I10 Essential (primary) hypertension: Secondary | ICD-10-CM

## 2019-06-29 DIAGNOSIS — R531 Weakness: Secondary | ICD-10-CM

## 2019-06-29 DIAGNOSIS — K219 Gastro-esophageal reflux disease without esophagitis: Secondary | ICD-10-CM

## 2019-06-29 DIAGNOSIS — N183 Chronic kidney disease, stage 3 (moderate): Secondary | ICD-10-CM

## 2019-06-29 DIAGNOSIS — I130 Hypertensive heart and chronic kidney disease with heart failure and stage 1 through stage 4 chronic kidney disease, or unspecified chronic kidney disease: Secondary | ICD-10-CM | POA: Diagnosis not present

## 2019-06-29 DIAGNOSIS — D696 Thrombocytopenia, unspecified: Secondary | ICD-10-CM

## 2019-06-29 DIAGNOSIS — C8304 Small cell B-cell lymphoma, lymph nodes of axilla and upper limb: Secondary | ICD-10-CM

## 2019-06-29 LAB — CBC WITH DIFFERENTIAL/PLATELET
Abs Immature Granulocytes: 0 10*3/uL (ref 0.00–0.07)
Basophils Absolute: 0 10*3/uL (ref 0.0–0.1)
Basophils Relative: 0 %
Eosinophils Absolute: 0 10*3/uL (ref 0.0–0.5)
Eosinophils Relative: 1 %
HCT: 30.7 % — ABNORMAL LOW (ref 36.0–46.0)
Hemoglobin: 9.7 g/dL — ABNORMAL LOW (ref 12.0–15.0)
Immature Granulocytes: 0 %
Lymphocytes Relative: 41 %
Lymphs Abs: 1.4 10*3/uL (ref 0.7–4.0)
MCH: 28.9 pg (ref 26.0–34.0)
MCHC: 31.6 g/dL (ref 30.0–36.0)
MCV: 91.4 fL (ref 80.0–100.0)
Monocytes Absolute: 0.3 10*3/uL (ref 0.1–1.0)
Monocytes Relative: 8 %
Neutro Abs: 1.7 10*3/uL (ref 1.7–7.7)
Neutrophils Relative %: 50 %
Platelets: 122 10*3/uL — ABNORMAL LOW (ref 150–400)
RBC: 3.36 MIL/uL — ABNORMAL LOW (ref 3.87–5.11)
RDW: 13.1 % (ref 11.5–15.5)
WBC: 3.4 10*3/uL — ABNORMAL LOW (ref 4.0–10.5)
nRBC: 0 % (ref 0.0–0.2)

## 2019-06-29 LAB — BASIC METABOLIC PANEL
Anion gap: 9 (ref 5–15)
BUN: 17 mg/dL (ref 8–23)
CO2: 23 mmol/L (ref 22–32)
Calcium: 9 mg/dL (ref 8.9–10.3)
Chloride: 106 mmol/L (ref 98–111)
Creatinine, Ser: 1.27 mg/dL — ABNORMAL HIGH (ref 0.44–1.00)
GFR calc Af Amer: 44 mL/min — ABNORMAL LOW (ref >60)
GFR calc non Af Amer: 38 mL/min — ABNORMAL LOW (ref >60)
Glucose, Bld: 99 mg/dL (ref 70–99)
Potassium: 4.1 mmol/L (ref 3.5–5.1)
Sodium: 138 mmol/L (ref 135–145)

## 2019-06-29 LAB — HEPATIC FUNCTION PANEL
ALT: 10 U/L (ref 0–44)
AST: 17 U/L (ref 15–41)
Albumin: 3.1 g/dL — ABNORMAL LOW (ref 3.5–5.0)
Alkaline Phosphatase: 61 U/L (ref 38–126)
Bilirubin, Direct: 0.2 mg/dL (ref 0.0–0.2)
Indirect Bilirubin: 0.4 mg/dL (ref 0.3–0.9)
Total Bilirubin: 0.6 mg/dL (ref 0.3–1.2)
Total Protein: 6 g/dL — ABNORMAL LOW (ref 6.5–8.1)

## 2019-06-29 LAB — SARS CORONAVIRUS 2 (TAT 6-24 HRS): SARS Coronavirus 2: NEGATIVE

## 2019-06-29 LAB — TSH: TSH: 1.732 u[IU]/mL (ref 0.350–4.500)

## 2019-06-29 LAB — ECHOCARDIOGRAM COMPLETE
Height: 62 in
Weight: 2465.62 oz

## 2019-06-29 LAB — TROPONIN I (HIGH SENSITIVITY)
Troponin I (High Sensitivity): 19 ng/L — ABNORMAL HIGH (ref <18)
Troponin I (High Sensitivity): 17 ng/L (ref <18)

## 2019-06-29 LAB — MAGNESIUM: Magnesium: 2 mg/dL (ref 1.7–2.4)

## 2019-06-29 MED ORDER — ENOXAPARIN SODIUM 30 MG/0.3ML ~~LOC~~ SOLN
30.0000 mg | SUBCUTANEOUS | Status: DC
  Administered 2019-06-29: 30 mg via SUBCUTANEOUS
  Filled 2019-06-29: qty 0.3

## 2019-06-29 MED ORDER — ONDANSETRON HCL 4 MG PO TABS: 4.0000 mg | ORAL_TABLET | Freq: Four times a day (QID) | ORAL | Status: DC | PRN

## 2019-06-29 MED ORDER — POTASSIUM CHLORIDE ER 10 MEQ PO TBCR: 10.0000 meq | EXTENDED_RELEASE_TABLET | Freq: Every day | ORAL | 0 refills | Status: DC

## 2019-06-29 MED ORDER — LISINOPRIL 20 MG PO TABS: 20.0000 mg | ORAL_TABLET | Freq: Every day | ORAL | Status: DC

## 2019-06-29 MED ORDER — FUROSEMIDE 40 MG PO TABS: 40.0000 mg | ORAL_TABLET | Freq: Every day | ORAL | 1 refills | Status: DC

## 2019-06-29 MED ORDER — LISINOPRIL 10 MG PO TABS: 10.0000 mg | ORAL_TABLET | Freq: Every day | ORAL | 1 refills | Status: DC

## 2019-06-29 MED ORDER — SENNOSIDES-DOCUSATE SODIUM 8.6-50 MG PO TABS: 2.0000 | ORAL_TABLET | Freq: Every evening | ORAL | Status: DC | PRN

## 2019-06-29 MED ORDER — BISACODYL 10 MG RE SUPP: 10.0000 mg | RECTAL | Status: DC | PRN

## 2019-06-29 MED ORDER — LATANOPROST 0.005 % OP SOLN
1.0000 [drp] | Freq: Every day | OPHTHALMIC | Status: DC
  Filled 2019-06-29: qty 2.5

## 2019-06-29 MED ORDER — PANTOPRAZOLE SODIUM 40 MG PO TBEC
40.0000 mg | DELAYED_RELEASE_TABLET | Freq: Every day | ORAL | Status: DC
  Administered 2019-06-29: 40 mg via ORAL
  Filled 2019-06-29: qty 1

## 2019-06-29 MED ORDER — DORZOLAMIDE HCL-TIMOLOL MAL 2-0.5 % OP SOLN
1.0000 [drp] | Freq: Two times a day (BID) | OPHTHALMIC | Status: DC
  Administered 2019-06-29: 1 [drp] via OPHTHALMIC
  Filled 2019-06-29: qty 10

## 2019-06-29 MED ORDER — ACETAMINOPHEN 650 MG RE SUPP: 650.0000 mg | Freq: Four times a day (QID) | RECTAL | Status: DC | PRN

## 2019-06-29 MED ORDER — LABETALOL HCL 5 MG/ML IV SOLN: 10.0000 mg | INTRAVENOUS | Status: DC | PRN

## 2019-06-29 MED ORDER — ONDANSETRON HCL 4 MG/2ML IJ SOLN: 4.0000 mg | Freq: Four times a day (QID) | INTRAMUSCULAR | Status: DC | PRN

## 2019-06-29 MED ORDER — FUROSEMIDE 10 MG/ML IJ SOLN
40.0000 mg | Freq: Two times a day (BID) | INTRAMUSCULAR | Status: DC
  Administered 2019-06-29 (×2): 40 mg via INTRAVENOUS
  Filled 2019-06-29 (×2): qty 4

## 2019-06-29 MED ORDER — SALINE SPRAY 0.65 % NA SOLN
1.0000 | NASAL | Status: DC | PRN
  Filled 2019-06-29: qty 44

## 2019-06-29 MED ORDER — ACETAMINOPHEN 325 MG PO TABS
650.0000 mg | ORAL_TABLET | Freq: Four times a day (QID) | ORAL | Status: DC | PRN
  Administered 2019-06-29: 650 mg via ORAL
  Filled 2019-06-29: qty 2

## 2019-06-29 MED ORDER — DICYCLOMINE HCL 10 MG PO CAPS: 10.0000 mg | ORAL_CAPSULE | Freq: Four times a day (QID) | ORAL | Status: DC | PRN

## 2019-06-29 NOTE — Progress Notes (Signed)
Admission from earlier this morning. Briefly, 85 year old female with history of lymphoma in remission, pancytopenia, CKD 3, diastolic CHF,  and pulmonary HTN presenting with shortness of breath, edema, abdominal distention and epigastric discomfort and admitted with CHF exacerbation.  Chest x-ray consistent with CHF exacerbation.  Lower extremity venous Doppler negative for DVT.  Started on IV Lasix with good response and significant improvement in his symptoms.  She has already diuresed about 2 L.  Renal function improved.  Blood pressure improved.  Pancytopenia better likely from diuresis.  Discontinue lisinopril to allow room for diuretics.  Echocardiogram pending.  We will continue IV Lasix Follow echocardiogram  Updated patient's daughter over the phone.

## 2019-06-29 NOTE — Plan of Care (Signed)
Problem: Education: Goal: Knowledge of General Education information will improve Description: Including pain rating scale, medication(s)/side effects and non-pharmacologic comfort measures 06/29/2019 1755 by Arlina Robes, RN Outcome: Adequate for Discharge 06/29/2019 1438 by Arlina Robes, RN Outcome: Progressing   Problem: Health Behavior/Discharge Planning: Goal: Ability to manage health-related needs will improve 06/29/2019 1755 by Arlina Robes, RN Outcome: Adequate for Discharge 06/29/2019 1438 by Arlina Robes, RN Outcome: Progressing   Problem: Clinical Measurements: Goal: Ability to maintain clinical measurements within normal limits will improve 06/29/2019 1755 by Arlina Robes, RN Outcome: Adequate for Discharge 06/29/2019 1438 by Arlina Robes, RN Outcome: Progressing Goal: Will remain free from infection 06/29/2019 1755 by Arlina Robes, RN Outcome: Adequate for Discharge 06/29/2019 1438 by Arlina Robes, RN Outcome: Progressing Goal: Diagnostic test results will improve 06/29/2019 1755 by Arlina Robes, RN Outcome: Adequate for Discharge 06/29/2019 1438 by Arlina Robes, RN Outcome: Progressing Goal: Respiratory complications will improve 06/29/2019 1755 by Arlina Robes, RN Outcome: Adequate for Discharge 06/29/2019 1438 by Arlina Robes, RN Outcome: Progressing Goal: Cardiovascular complication will be avoided 06/29/2019 1755 by Arlina Robes, RN Outcome: Adequate for Discharge 06/29/2019 1438 by Arlina Robes, RN Outcome: Progressing   Problem: Activity: Goal: Risk for activity intolerance will decrease 06/29/2019 1755 by Arlina Robes, RN Outcome: Adequate for Discharge 06/29/2019 1438 by Arlina Robes, RN Outcome: Progressing   Problem: Nutrition: Goal: Adequate nutrition will be maintained 06/29/2019 1755 by Arlina Robes, RN Outcome: Adequate for Discharge 06/29/2019 1438 by  Arlina Robes, RN Outcome: Progressing   Problem: Coping: Goal: Level of anxiety will decrease 06/29/2019 1755 by Arlina Robes, RN Outcome: Adequate for Discharge 06/29/2019 1438 by Arlina Robes, RN Outcome: Progressing   Problem: Elimination: Goal: Will not experience complications related to bowel motility 06/29/2019 1755 by Arlina Robes, RN Outcome: Adequate for Discharge 06/29/2019 1438 by Arlina Robes, RN Outcome: Progressing Goal: Will not experience complications related to urinary retention 06/29/2019 1755 by Arlina Robes, RN Outcome: Adequate for Discharge 06/29/2019 1438 by Arlina Robes, RN Outcome: Progressing   Problem: Pain Managment: Goal: General experience of comfort will improve 06/29/2019 1755 by Arlina Robes, RN Outcome: Adequate for Discharge 06/29/2019 1438 by Arlina Robes, RN Outcome: Progressing   Problem: Safety: Goal: Ability to remain free from injury will improve 06/29/2019 1755 by Arlina Robes, RN Outcome: Adequate for Discharge 06/29/2019 1438 by Arlina Robes, RN Outcome: Progressing   Problem: Skin Integrity: Goal: Risk for impaired skin integrity will decrease 06/29/2019 1755 by Arlina Robes, RN Outcome: Adequate for Discharge 06/29/2019 1438 by Arlina Robes, RN Outcome: Progressing   Problem: Education: Goal: Ability to demonstrate management of disease process will improve 06/29/2019 1755 by Arlina Robes, RN Outcome: Adequate for Discharge 06/29/2019 1438 by Arlina Robes, RN Outcome: Progressing Goal: Ability to verbalize understanding of medication therapies will improve 06/29/2019 1755 by Arlina Robes, RN Outcome: Adequate for Discharge 06/29/2019 1438 by Arlina Robes, RN Outcome: Progressing Goal: Individualized Educational Video(s) 06/29/2019 1755 by Arlina Robes, RN Outcome: Adequate for Discharge 06/29/2019 1438 by Arlina Robes,  RN Outcome: Progressing   Problem: Activity: Goal: Capacity to carry out activities will improve 06/29/2019 1755 by Arlina Robes, RN Outcome: Adequate for Discharge 06/29/2019 1438 by Arlina Robes, RN Outcome: Progressing   Problem: Cardiac: Goal: Ability to achieve and maintain adequate cardiopulmonary perfusion  will improve 06/29/2019 1755 by Arlina Robes, RN Outcome: Adequate for Discharge 06/29/2019 1438 by Arlina Robes, RN Outcome: Progressing

## 2019-06-29 NOTE — H&P (Signed)
History and Physical    Erika Day [id number] DOB: July 15, 1930 DOA: 06/28/2019  PCP: Mack Hook, MD  Patient coming from: Home.  Chief Complaint: Shortness of breath.  HPI: Erika Day is a 85 y.o. female with history of lymphoma in remission, pancytopenia, chronic kidney disease stage III, diastolic dysfunction per 2D echo done in 2017 at that time 2D echo showed EF of 60 to 65% with grade 2 diastolic dysfunction with pulmonary hypertension was brought to the ER after patient was found to be short of breath since yesterday morning.  Denies any chest pain productive cough fever chills.  Patient complains of some abdominal distention and epigastric discomfort but denies nausea vomiting or diarrhea.  Patient is a poor historian.  Unable to reach patient's daughter.  ED Course: In the ER chest x-ray shows features concerning for pulmonary edema.  On exam patient has bilateral lower extremity edema.  Abdomen appears benign on exam.  EKG shows normal sinus rhythm labs revealed BNP 275 creatinine 1.5 hemoglobin 10 WBC 2.8 platelets 114.  Patient was given Lasix 40 mg IV and admitted for acute CHF.  Dopplers of the lower extremity was negative for DVT COVID-19 test was negative.  Review of Systems: As per HPI, rest all negative.   Past Medical History:  Diagnosis Date  . Acid reflux disease   . Cancer ([place])   . CLL (chronic lymphocytic leukemia) ([place]) 05/15/2016  . DDD (degenerative disc disease), lumbar   . Degenerative joint disease   . Diabetes mellitus without complication [facility])    Patient states this was a misdiagnosis from years ago  . Dyslipidemia   . History of diabetes mellitus 04/19/2017  . Hypertension   . 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 Panic attacks   . Pneumonia 02/2016  . 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 Stroke [facility]) age 49 yo   poorly controlled DM and Hypertension    Past Surgical History:  Procedure  Laterality Date  . ABDOMINAL HYSTERECTOMY  ? 08/25/2011 ?   Has BSO for left benign ovarian tumor with torsion and broad ligament benign tumor, but does not appear had hysterectomy--[facility], NOT ovarian cancer  . CHOLECYSTECTOMY    . FEMUR IM NAIL Left 11/23/2016   Procedure: INTRAMEDULLARY (IM) NAIL FEMORAL;  Surgeon: Gaynelle Arabian, MD;  Location: [facility];  Service: Orthopedics;  Laterality: Left;  . FLEXIBLE SIGMOIDOSCOPY N/A 05/19/2016   Procedure: FLEXIBLE SIGMOIDOSCOPY;  Surgeon: [name], MD;  Location: [facility];  Service: Endoscopy;  Laterality: N/A;  . FLEXIBLE SIGMOIDOSCOPY N/A 06/28/2016   Procedure: FLEXIBLE SIGMOIDOSCOPY;  Surgeon: Ladene Artist, MD;  Location: [facility];  Service: Endoscopy;  Laterality: N/A;  . LAPAROSCOPIC SIGMOID COLECTOMY N/A 06/30/2016   Procedure: LAPAROSCOPIC SIGMOID COLECTOMY,rigid sigmoidoscopy, repair recurrent incisional hernia;  Surgeon: Michael Boston, MD;  Location: [facility];  Service: General;  Laterality: N/A;  . LAPAROTOMY N/A 04/29/2014   Procedure: EXPLORATORY LAPAROTOMY;  Surgeon: Imogene Burn. Georgette Dover, MD;  Location: [place];  Service: General;  Laterality: N/A;  . LAPAROTOMY N/A 04/16/2018   Procedure: EXPLORATORY LAPAROTOMY WITH LYSIS OF ADHESIONS;  Surgeon: Armandina Gemma, MD;  Location: [facility];  Service: General;  Laterality: N/A;  . LYSIS OF ADHESION N/A 04/29/2014   Procedure: EXTENSIVE LYSIS OF ADHESIONS;  Surgeon: Imogene Burn. Georgette Dover, MD;  Location: [place];  Service: General;  Laterality: N/A;  . VENTRAL HERNIA REPAIR N/A 04/29/2014   Procedure: PRIMARY REPAIR OF  VENTRAL HERNIA;  Surgeon: Imogene Burn. Georgette Dover, MD;  Location: [place];  Service: General;  Laterality: N/A;     reports that she quit smoking about 29 years ago. Her smoking use included cigarettes. She quit smokeless tobacco use about 29 years ago.  Her smokeless tobacco use included snuff. She reports that she does not drink alcohol or use drugs.  Allergies  Allergen Reactions  . Morphine  And Related Other (See Comments)    Blood sugar dropped one-time    Family History  Problem Relation Age of Onset  . Hypertension Mother   . Stroke Mother   . Arthritis Mother   . Alcohol abuse Father     Prior to Admission medications   Medication Sig Start Date End Date Taking? Authorizing Provider  acetaminophen (TYLENOL) 325 MG tablet Take 2 tablets (650 mg total) by mouth every 4 (four) hours as needed for mild pain, moderate pain, fever or headache. 04/22/18  Yes Arrien, Jimmy Picket, MD  bisacodyl (DULCOLAX) 10 MG suppository Place 1 suppository (10 mg total) rectally as needed for moderate constipation. 02/06/19  Yes Mack Hook, MD  cetirizine (ZYRTEC) 10 MG tablet 1 tab by mouth once daily with evening meal for allergies Patient taking differently: Take 10 mg by mouth daily as needed for allergies.  11/16/18  Yes Mack Hook, MD  diclofenac sodium (VOLTAREN) 1 % GEL Apply 2 g topically 4 (four) times daily. 11/16/18  Yes Mack Hook, MD  dicyclomine (BENTYL) 10 MG capsule 1 cap by mouth every 6 hours as needed for abdominal cramping. Patient taking differently: Take 10 mg by mouth every 6 (six) hours as needed (abdominal cramping).  05/25/18  Yes Mack Hook, MD  dorzolamide-timolol (COSOPT) 22.3-6.8 MG/ML ophthalmic solution Place 1 drop into both eyes 2 (two) times daily. 05/11/19  Yes [provider]  furosemide (LASIX) 20 MG tablet 1 tab by mouth once daily in morning Patient taking differently: Take 20 mg by mouth daily.  11/16/18  Yes Mack Hook, MD  latanoprost (XALATAN) 0.005 % ophthalmic solution Place 1 drop into both eyes at bedtime.    Yes [provider]  lisinopril (PRINIVIL,ZESTRIL) 20 MG tablet 1 tab by mouth every morning Patient taking differently: Take 20 mg by mouth daily.  11/16/18  Yes Mack Hook, MD  Olopatadine HCl (PATADAY) 0.2 % SOLN 1 drop each eye daily Patient taking differently: Place 1  drop into both eyes daily.  01/20/19  Yes Mack Hook, MD  pantoprazole (PROTONIX) 40 MG tablet 1 tab by mouth daily at 10 pm on empty stomach Patient taking differently: Take 40 mg by mouth daily.  11/16/18  Yes Mack Hook, MD  polyethylene glycol powder (GLYCOLAX/MIRALAX) powder Mix 17 g powder in 8 oz of water and drink once daily in the morning 11/16/18  Yes Mack Hook, MD  senna-docusate (SENOKOT-S) 8.6-50 MG tablet Take 2 tablets by mouth at bedtime as needed for mild constipation. 05/25/18  Yes Mack Hook, MD  Elastic Bandages & Supports (MEDICAL COMPRESSION STOCKINGS) MISC 20-30 mm Hg with toe thigh high graduated compression stockings on every morning and off at night. 11/05/17   Mack Hook, MD    Physical Exam: Constitutional: Moderately built and nourished. Vitals:   06/28/19 1915 06/28/19 1930 06/28/19 2004 06/28/19 2221  BP: (!) 153/74 (!) 164/61 (!) 176/71 (!) 179/84  Pulse: 61 65 61 64  Resp: 18 12 14 20   Temp:  97.6 F (36.4 C) 97.6 F (36.4 C) 97.6 F (36.4 C)  TempSrc:  Oral Oral Oral  SpO2: 100% 100% 97% 96%  Weight:      Height:       Eyes: Anicteric no pallor. ENMT: No discharge from the ears eyes nose or mouth. Neck: JVD elevated no mass felt. Respiratory: No rhonchi or crepitations. Cardiovascular: S1-S2 heard. Abdomen: Soft nontender bowel sounds present. Musculoskeletal: Bilateral lower extremity edema present. Skin: No rash. Neurologic: Alert awake oriented to time place and person.  Moves all extremities. Psychiatric: Appears normal.   Labs on Admission: I have personally reviewed following labs and imaging studies  CBC: Recent Labs  Lab 06/28/19 1754  WBC 2.8*  NEUTROABS 1.3*  HGB 10.3*  HCT 33.8*  MCV 95.2  PLT 99991111*   Basic Metabolic Panel: Recent Labs  Lab 06/28/19 1754  NA 139  K 4.2  CL 108  CO2 21*  GLUCOSE 96  BUN 19  CREATININE 1.53*  CALCIUM 9.0   GFR: Estimated Creatinine  Clearance: 22.4 mL/min (A) (by C-G formula based on SCr of 1.53 mg/dL (H)). Liver Function Tests: No results for input(s): AST, ALT, ALKPHOS, BILITOT, PROT, ALBUMIN in the last 168 hours. No results for input(s): LIPASE, AMYLASE in the last 168 hours. No results for input(s): AMMONIA in the last 168 hours. Coagulation Profile: No results for input(s): INR, PROTIME in the last 168 hours. Cardiac Enzymes: No results for input(s): CKTOTAL, CKMB, CKMBINDEX, TROPONINI in the last 168 hours. BNP (last 3 results) No results for input(s): PROBNP in the last 8760 hours. HbA1C: No results for input(s): HGBA1C in the last 72 hours. CBG: No results for input(s): GLUCAP in the last 168 hours. Lipid Profile: No results for input(s): CHOL, HDL, LDLCALC, TRIG, CHOLHDL, LDLDIRECT in the last 72 hours. Thyroid Function Tests: No results for input(s): TSH, T4TOTAL, FREET4, T3FREE, THYROIDAB in the last 72 hours. Anemia Panel: No results for input(s): VITAMINB12, FOLATE, FERRITIN, TIBC, IRON, RETICCTPCT in the last 72 hours. Urine analysis:    Component Value Date/Time   COLORURINE YELLOW 04/08/2018 1805   APPEARANCEUR CLEAR 04/08/2018 1805   LABSPEC 1.027 04/08/2018 1805   PHURINE 5.0 04/08/2018 1805   GLUCOSEU NEGATIVE 04/08/2018 1805   HGBUR NEGATIVE 04/08/2018 1805   BILIRUBINUR neg 10/20/2018 1649   KETONESUR NEGATIVE 04/08/2018 1805   PROTEINUR Positive (A) 10/20/2018 1649   PROTEINUR NEGATIVE 04/08/2018 1805   UROBILINOGEN 0.2 10/20/2018 1649   UROBILINOGEN 0.2 05/17/2015 2307   NITRITE pos 10/20/2018 1649   NITRITE NEGATIVE 04/08/2018 1805   LEUKOCYTESUR Large (3+) (A) 10/20/2018 1649   Sepsis Labs: @LABRCNTIP (procalcitonin:4,lacticidven:4) )No results found for this or any previous visit (from the past 240 hour(s)).   Radiological Exams on Admission: Dg Chest Port 1 View  Result Date: 06/28/2019 CLINICAL DATA:  Shortness of breath, pitting edema EXAM: PORTABLE CHEST 1 VIEW  COMPARISON:  Radiograph April 22, 2018, CT chest 02/13/2016 FINDINGS: Chronic elevation the right hemidiaphragm with adjacent hazy opacity likely reflecting atelectasis. The vascularity is cephalized indistinct some central vascular cuffing and peripheral septal lines. Included portions of the cardiac silhouette appears enlarged. The aorta is calcified and tortuous. No left effusion is seen. No pneumothorax. Degenerative changes are present in the and imaged spine and shoulders. IMPRESSION: 1. Mild pulmonary edema. 2. Chronic elevation of the right hemidiaphragm with adjacent hazy opacity likely reflecting atelectasis. Underlying pneumonia or aspiration is not excluded. Electronically Signed   By: Lovena Le M.D.   On: 06/28/2019 18:41   Vas [place] Lower Extremity Venous (dvt) (only [facility]  [facility] 7a-7p)  Result Date: 06/28/2019  Lower Venous Study Indications: Swelling.  Limitations: Body habitus, poor ultrasound/tissue interface and restricted mobility. Comparison Study: No recent prior study. Performing Technologist: Maudry Mayhew MHA, RDMS, RVT, RDCS  Examination Guidelines: A complete evaluation includes B-mode imaging, spectral Doppler, color Doppler, and power Doppler as needed of all accessible portions of each vessel. Bilateral testing is considered an integral part of a complete examination. Limited examinations for reoccurring indications may be performed as noted.  +---------+---------------+---------+-----------+----------+--------------+ RIGHT    CompressibilityPhasicitySpontaneityPropertiesThrombus Aging +---------+---------------+---------+-----------+----------+--------------+ CFV      Full           Yes      Yes                                 +---------+---------------+---------+-----------+----------+--------------+ SFJ      Full                                                        +---------+---------------+---------+-----------+----------+--------------+ FV Prox  Full                                                         +---------+---------------+---------+-----------+----------+--------------+ FV Mid   Full                                                        +---------+---------------+---------+-----------+----------+--------------+ FV DistalFull                                                        +---------+---------------+---------+-----------+----------+--------------+ POP      Full                                                        +---------+---------------+---------+-----------+----------+--------------+ Unable to visualize right profunda femoral, posterior tibial, and peroneal veins. Unable to obtain right popliteal vein pulsed wave signal due to patient position.  +-------+---------------+---------+-----------+----------+--------------+ LEFT   CompressibilityPhasicitySpontaneityPropertiesThrombus Aging +-------+---------------+---------+-----------+----------+--------------+ CFV    Full           No       Yes                                 +-------+---------------+---------+-----------+----------+--------------+ FV ProxFull                                                        +-------+---------------+---------+-----------+----------+--------------+  FV Mid Full                                                        +-------+---------------+---------+-----------+----------+--------------+ Unable to visualize left profunda femoral, distal femoral, popliteal, posterior tibial, and peroneal veins. Pulsatile venous flow.    Summary: Right: There is no evidence of deep vein thrombosis in the lower extremity. However, portions of this examination were limited- see technologist comments above. No cystic structure found in the popliteal fossa. Left: There is no evidence of deep vein thrombosis in the lower extremity. However, portions of this examination were limited- see technologist comments above. No cystic  structure found in the popliteal fossa.  *See table(s) above for measurements and observations.    Preliminary     EKG: Independently reviewed.  Normal sinus rhythm.  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 Acute on chronic diastolic CHF last EF measured in 2070 was 60 to 65% with grade 2 diastolic dysfunction with pulmonary hypertension -not sure if patient has been taking Lasix though medication list does show.  I was unable to reach patient's daughter to get further history.  Patient was given Lasix 40 mg IV in the ER I have placed patient on 40 mg IV every 12.  Note that patient is having chronic disease and also on lisinopril.  Will check 2D echo.  Check cardiac markers.  Closely follow intake output daily weights and metabolic panel. 2. Abdominal discomfort -on exam patient abdomen appears benign.  Will check LFTs lipase.  Closely observe. 3. Chronic kidney disease stage III creatinine is around 1.5.  Patient is on lisinopril and presently on IV Lasix.  Closely follow metabolic panel. 4. Hypertension on lisinopril see #3 regarding to kidney disease. 5. Chronic pancytopenia being followed by oncologist. 6. History of lymphoma and MGUS being followed by oncologist.  Given that patient will need close follow-up with IV diuresis and at the same time follow-up of kidney functions will need inpatient status.  Once patient's daughter is available will need to get further history.  Patient appears to be a poor historian.   DVT prophylaxis: Lovenox. Code Status: Full code. Family Communication: Unable to reach patient's daughter. Disposition Plan: Home. Consults called: None. Admission status:  Inpatient.   Rise Patience MD [facility] Pager [phone].  If 7PM-7AM, please contact night-coverage [email or web address] Password [facility]  06/29/2019, 12:41 AM

## 2019-06-29 NOTE — Discharge Summary (Signed)
Physician Discharge Summary  Erika Day [id number] DOB: 1930-02-14 DOA: 06/28/2019  PCP: Mack Hook, MD  Admit date: 06/28/2019 Discharge date: 06/29/2019  Admitted From: Home Disposition: Home  Recommendations for Outpatient Follow-up:  1. Follow up with PCP in 1-2 weeks 2. Please obtain CBC/BMP/Mag at follow up 3. Please follow up on the following pending results: None  Home Health: Resumed HH PT/OT/RN/aide Equipment/Devices: None  Discharge Condition: Stable CODE STATUS: Full code  HPI: Per Dr. Ander Slade is a 85 y.o. female with history of lymphoma in remission, pancytopenia, chronic kidney disease stage III, diastolic dysfunction per 2D echo done in 2017 at that time 2D echo showed EF of 60 to 65% with grade 2 diastolic dysfunction with pulmonary hypertension was brought to the ER after patient was found to be short of breath since yesterday morning.  Denies any chest pain productive cough fever chills.  Patient complains of some abdominal distention and epigastric discomfort but denies nausea vomiting or diarrhea.  Patient is a poor historian.  Unable to reach patient's daughter.  ED Course: In the ER chest x-ray shows features concerning for pulmonary edema.  On exam patient has bilateral lower extremity edema.  Abdomen appears benign on exam.  EKG shows normal sinus rhythm labs revealed BNP 275 creatinine 1.5 hemoglobin 10 WBC 2.8 platelets 114.  Patient was given Lasix 40 mg IV and admitted for acute CHF.  Dopplers of the lower extremity was negative for DVT COVID-19 test was negative.  Hospital Course: Patient with past medical history as above admitted with CHF exacerbation.  Diuresed with IV Lasix with significant improvement in her symptoms quicker than expected.  She diuresed greater than 2 L to less than 24-hour.  Echocardiogram with normal EF and diastolic impairment but no other significant finding.  CXR and CTA chest without acute finding.   Renal function improved.  Evaluated by PT.  Patient already had home health.  Home health service resumed. Discharged on p.o. Lasix 40 mg daily.  Reduce lisinopril to 10 mg daily.  Recommended follow-up with PCP.   Discussed findings and plan with patient's daughter over the phone prior to discharge.  See individual problem list below for more on hospital course.  Discharge Diagnoses:  Acute on chronic diastolic CHF: Echo with EF of 60 to 65%, DD, moderate LAE, moderate AS and mild pHTN.  CXR and CTA chest without acute finding.  Diuresed with IV Lasix with significant improvement in her respiratory status.  Renal function improved as well. -Discharged home on p.o. Lasix 40 mg daily -Reduce lisinopril to 10 mg daily. -Potassium chloride 10 mEq daily. -Follow-up with PCP in 1 to 2 weeks -Repeat BMP and magnesium at follow-up.  Abdominal discomfort: Likely due to the above and GERD. -CHF as above -Continue home PPI  CKD-3: Renal function improved with diuretics. -Recheck BMP at follow-up  Hypertension: Normotensive -Cardiac meds as above.  History of lymphoma/pancytopenia: Pancytopenia improved with diuretics. -Outpatient follow-up with oncology  Generalized weakness: Likely due to the above.  Improved with diuretics. -Home health resumed on discharge.  Discharge Instructions  Discharge Instructions    (HEART FAILURE PATIENTS) Call MD:  Anytime you have any of the following symptoms: 1) 3 pound weight gain in 24 hours or 5 pounds in 1 week 2) shortness of breath, with or without a dry hacking cough 3) swelling in the hands, feet or stomach 4) if you have to sleep on extra pillows at night in order to breathe.  Complete by: As directed    Call MD for:  difficulty breathing, headache or visual disturbances   Complete by: As directed    Diet - low sodium heart healthy   Complete by: As directed    Discharge instructions   Complete by: As directed    It has been a pleasure  taking care of you! You were admitted with shortness of breath, generalized weakness and leg swelling which is likely due to your heart failure.  Your symptoms improved after treatment with fluid medication.  We made adjustments to your fluid medication and your blood pressure medication.  Please review your new medication list and the directions before you take your medications. Please call your primary care office as soon as possible to schedule hospital follow-up visit in 1 to 2 weeks.  Take care,   Increase activity slowly   Complete by: As directed      Allergies as of 06/29/2019      Reactions   Morphine And Related Other (See Comments)   Blood sugar dropped one-time      Medication List    TAKE these medications   acetaminophen 325 MG tablet Commonly known as: TYLENOL Take 2 tablets (650 mg total) by mouth every 4 (four) hours as needed for mild pain, moderate pain, fever or headache.   bisacodyl 10 MG suppository Commonly known as: DULCOLAX Place 1 suppository (10 mg total) rectally as needed for moderate constipation.   cetirizine 10 MG tablet Commonly known as: ZYRTEC 1 tab by mouth once daily with evening meal for allergies What changed:   how much to take  how to take this  when to take this  reasons to take this  additional instructions   diclofenac sodium 1 % Gel Commonly known as: VOLTAREN Apply 2 g topically 4 (four) times daily.   dicyclomine 10 MG capsule Commonly known as: BENTYL 1 cap by mouth every 6 hours as needed for abdominal cramping. What changed:   how much to take  how to take this  when to take this  reasons to take this  additional instructions   dorzolamide-timolol 22.3-6.8 MG/ML ophthalmic solution Commonly known as: COSOPT Place 1 drop into both eyes 2 (two) times daily.   furosemide 40 MG tablet Commonly known as: Lasix Take 1 tablet (40 mg total) by mouth daily. What changed:   medication strength  how much to  take  how to take this  when to take this  additional instructions   latanoprost 0.005 % ophthalmic solution Commonly known as: XALATAN Place 1 drop into both eyes at bedtime.   lisinopril 10 MG tablet Commonly known as: ZESTRIL Take 1 tablet (10 mg total) by mouth daily. What changed:   medication strength  how much to take  how to take this  when to take this  additional instructions   Medical Compression Stockings Misc 20-30 mm Hg with toe thigh high graduated compression stockings on every morning and off at night.   Olopatadine HCl 0.2 % Soln Commonly known as: Pataday 1 drop each eye daily What changed:   how much to take  how to take this  when to take this  additional instructions   pantoprazole 40 MG tablet Commonly known as: PROTONIX 1 tab by mouth daily at 10 pm on empty stomach What changed:   how much to take  how to take this  when to take this  additional instructions   polyethylene glycol powder 17 GM/SCOOP powder  Commonly known as: GLYCOLAX/MIRALAX Mix 17 g powder in 8 oz of water and drink once daily in the morning   potassium chloride 10 MEQ tablet Commonly known as: K-DUR Take 1 tablet (10 mEq total) by mouth daily.   senna-docusate 8.6-50 MG tablet Commonly known as: Senokot-S Take 2 tablets by mouth at bedtime as needed for mild constipation.      Follow-up Information    Mack Hook, MD. Schedule an appointment as soon as possible for a visit in 1 week.   Specialty: Internal Medicine Why: PLEASE CALL MAKE APPT.  OFFICE CLOSED Contact information: [place] [address] [phone]           Consultations:  None  Procedures/Studies: 2D Echo:  1. The left ventricle has normal systolic function with an ejection fraction of 60-65%. The cavity size was normal. Left ventricular diastolic Doppler parameters are consistent with impaired relaxation. Elevated mean left atrial pressure No  evidence of  left ventricular regional wall motion abnormalities.  2. The right ventricle has normal systolic function. The cavity was normal. There is no increase in right ventricular wall thickness. Right ventricular systolic pressure is mildly elevated with an estimated pressure of 34.4 mmHg.  3. Left atrial size was moderately dilated.  4. There is moderate mitral annular calcification present.  5. The aortic valve is tricuspid. Moderate thickening of the aortic valve. Mild calcification of the aortic valve. Aortic valve regurgitation is trivial by color flow Doppler. Moderate stenosis of the aortic valve.  6. The aorta is abnormal unless otherwise noted.  7. The aortic root is normal in size and structure.   8. There is mild dilatation of the ascending aorta measuring 38 mm.  Dg Chest Port 1 View  Result Date: 06/28/2019 CLINICAL DATA:  Shortness of breath, pitting edema EXAM: PORTABLE CHEST 1 VIEW COMPARISON:  Radiograph April 22, 2018, CT chest 02/13/2016 FINDINGS: Chronic elevation the right hemidiaphragm with adjacent hazy opacity likely reflecting atelectasis. The vascularity is cephalized indistinct some central vascular cuffing and peripheral septal lines. Included portions of the cardiac silhouette appears enlarged. The aorta is calcified and tortuous. No left effusion is seen. No pneumothorax. Degenerative changes are present in the and imaged spine and shoulders. IMPRESSION: 1. Mild pulmonary edema. 2. Chronic elevation of the right hemidiaphragm with adjacent hazy opacity likely reflecting atelectasis. Underlying pneumonia or aspiration is not excluded. Electronically Signed   By: Lovena Le M.D.   On: 06/28/2019 18:41   Vas [place] Lower Extremity Venous (dvt) (only Mc & Wl 7a-7p)  Result Date: 06/29/2019  Lower Venous Study Indications: Swelling.  Limitations: Body habitus, poor ultrasound/tissue interface and restricted mobility. Comparison Study: No recent prior study. Performing  Technologist: Maudry Mayhew MHA, RDMS, RVT, RDCS  Examination Guidelines: A complete evaluation includes B-mode imaging, spectral Doppler, color Doppler, and power Doppler as needed of all accessible portions of each vessel. Bilateral testing is considered an integral part of a complete examination. Limited examinations for reoccurring indications may be performed as noted.  +---------+---------------+---------+-----------+----------+--------------+ RIGHT    CompressibilityPhasicitySpontaneityPropertiesThrombus Aging +---------+---------------+---------+-----------+----------+--------------+ CFV      Full           Yes      Yes                                 +---------+---------------+---------+-----------+----------+--------------+ SFJ      Full                                                        +---------+---------------+---------+-----------+----------+--------------+  FV Prox  Full                                                        +---------+---------------+---------+-----------+----------+--------------+ FV Mid   Full                                                        +---------+---------------+---------+-----------+----------+--------------+ FV DistalFull                                                        +---------+---------------+---------+-----------+----------+--------------+ POP      Full                                                        +---------+---------------+---------+-----------+----------+--------------+ Unable to visualize right profunda femoral, posterior tibial, and peroneal veins. Unable to obtain right popliteal vein pulsed wave signal due to patient position.  +-------+---------------+---------+-----------+----------+--------------+ LEFT   CompressibilityPhasicitySpontaneityPropertiesThrombus Aging +-------+---------------+---------+-----------+----------+--------------+ CFV    Full           No       Yes                                  +-------+---------------+---------+-----------+----------+--------------+ FV ProxFull                                                        +-------+---------------+---------+-----------+----------+--------------+ FV Mid Full                                                        +-------+---------------+---------+-----------+----------+--------------+ Unable to visualize left profunda femoral, distal femoral, popliteal, posterior tibial, and peroneal veins. Pulsatile venous flow.    Summary: Right: There is no evidence of deep vein thrombosis in the lower extremity. However, portions of this examination were limited- see technologist comments above. No cystic structure found in the popliteal fossa. Left: There is no evidence of deep vein thrombosis in the lower extremity. However, portions of this examination were limited- see technologist comments above. No cystic structure found in the popliteal fossa.  *See table(s) above for measurements and observations. Electronically signed by Harold Barban MD on 06/29/2019 at 7:36:31 AM.    Final      Subjective: No major events overnight of this morning.  Reports significant improvement in her breathing and swelling.  Had over 2 L urine output in less than 12 hours.  Abdominal discomfort/pain improved with belching.  Denies chest pain  or urinary symptoms..    Discharge Exam: Vitals:   06/29/19 1150 06/29/19 1632  BP: (!) 139/58 (!) 184/75  Pulse: (!) 56 71  Resp: 16 18  Temp: 97.6 F (36.4 C) 98.4 F (36.9 C)  SpO2: 99% 97%    GENERAL: No acute distress.  Appears well.  HEENT: MMM.  Vision and hearing grossly intact.  NECK: Supple.  No apparent JVD. LUNGS:  No IWOB.  Fair air movement bilaterally.  No significant crackles. HEART:  RRR.  2/6 SEM over RUSB and LUSB. ABD: Bowel sounds present. Soft. Non tender.  MSK/EXT:  Moves all extremities. No apparent deformity.  Trace edema bilaterally. SKIN: no  apparent skin lesion or wound NEURO: Awake, alert and oriented appropriately.  No gross deficit.  PSYCH: Calm. Normal affect.     The results of significant diagnostics from this hospitalization (including imaging, microbiology, ancillary and laboratory) are listed below for reference.     Microbiology: Recent Results (from the past 240 hour(s))  SARS CORONAVIRUS 2 (TAT 6-12 HRS) Nasal Swab Aptima Multi Swab     Status: None   Collection Time: 06/28/19  7:05 PM   Specimen: Aptima Multi Swab; Nasal Swab  Result Value Ref Range Status   SARS Coronavirus 2 NEGATIVE NEGATIVE Final    Comment: (NOTE) SARS-CoV-2 target nucleic acids are NOT DETECTED. The SARS-CoV-2 RNA is generally detectable in upper and lower respiratory specimens during the acute phase of infection. Negative results do not preclude SARS-CoV-2 infection, do not rule out co-infections with other pathogens, and should not be used as the sole basis for treatment or other patient management decisions. Negative results must be combined with clinical observations, patient history, and epidemiological information. The expected result is Negative. Fact Sheet for Patients: SugarRoll.be Fact Sheet for Healthcare Providers: https://www.woods-mathews.com/ This test is not yet approved or cleared by the [place] FDA and  has been authorized for detection and/or diagnosis of SARS-CoV-2 by FDA under an Emergency Use Authorization (EUA). This EUA will remain  in effect (meaning this test can be used) for the duration of the COVID-19 declaration under Section 56 4(b)(1) of the Act, 21 U.S.C. section 360bbb-3(b)(1), unless the authorization is terminated or revoked sooner. Performed at [facility], [place] [address], [place], [place] [address]      Labs: BNP (last 3 results) Recent Labs    06/28/19 1642  BNP [id number]*   Basic Metabolic Panel: Recent Labs  Lab 06/28/19 1754  06/29/19 0106  NA 139 138  K 4.2 4.1  CL 108 106  CO2 21* 23  GLUCOSE 96 99  BUN 19 17  CREATININE 1.53* 1.27*  CALCIUM 9.0 9.0  MG  --  2.0   Liver Function Tests: Recent Labs  Lab 06/29/19 0744  AST 17  ALT 10  ALKPHOS 61  BILITOT 0.6  PROT 6.0*  ALBUMIN 3.1*   No results for input(s): LIPASE, AMYLASE in the last 168 hours. No results for input(s): AMMONIA in the last 168 hours. CBC: Recent Labs  Lab 06/28/19 1754 06/29/19 0106  WBC 2.8* 3.4*  NEUTROABS 1.3* 1.7  HGB 10.3* 9.7*  HCT 33.8* 30.7*  MCV 95.2 91.4  PLT 114* 122*   Cardiac Enzymes: No results for input(s): CKTOTAL, CKMB, CKMBINDEX, TROPONINI in the last 168 hours. BNP: Invalid input(s): POCBNP CBG: No results for input(s): GLUCAP in the last 168 hours. D-Dimer No results for input(s): DDIMER in the last 72 hours. Hgb A1c No results for input(s): HGBA1C  in the last 72 hours. Lipid Profile No results for input(s): CHOL, HDL, LDLCALC, TRIG, CHOLHDL, LDLDIRECT in the last 72 hours. Thyroid function studies Recent Labs    06/29/19 0106  TSH 1.732   Anemia work up No results for input(s): VITAMINB12, FOLATE, FERRITIN, TIBC, IRON, RETICCTPCT in the last 72 hours. Urinalysis    Component Value Date/Time   COLORURINE YELLOW 04/08/2018 1805   APPEARANCEUR CLEAR 04/08/2018 1805   LABSPEC 1.027 04/08/2018 1805   PHURINE 5.0 04/08/2018 1805   GLUCOSEU NEGATIVE 04/08/2018 1805   HGBUR NEGATIVE 04/08/2018 1805   BILIRUBINUR neg 10/20/2018 1649   KETONESUR NEGATIVE 04/08/2018 1805   PROTEINUR Positive (A) 10/20/2018 1649   PROTEINUR NEGATIVE 04/08/2018 1805   UROBILINOGEN 0.2 10/20/2018 1649   UROBILINOGEN 0.2 05/17/2015 2307   NITRITE pos 10/20/2018 1649   NITRITE NEGATIVE 04/08/2018 1805   LEUKOCYTESUR Large (3+) (A) 10/20/2018 1649   Sepsis Labs Invalid input(s): PROCALCITONIN,  WBC,  LACTICIDVEN   Time coordinating discharge: 35 minutes  SIGNED:  Mercy Riding, MD  [facility] 06/29/2019, 5:20 PM  If 7PM-7AM, please contact night-coverage [email or web address] Password [email or web address]

## 2019-06-29 NOTE — Plan of Care (Signed)

## 2019-06-29 NOTE — Care Management CC44 (Signed)
Condition Code 44 Documentation Completed  Patient Details  Name: Erika Day MRN: [id number] Date of Birth: 15-Feb-1930   Condition Code 44 given:  Yes Patient signature on Condition Code 44 notice:  Yes Documentation of 2 MD's agreement:  Yes Code 44 added to claim:  Yes    Zenon Mayo, RN 06/29/2019, 5:03 PM

## 2019-06-29 NOTE — TOC Initial Note (Signed)
Transition of Care [facility]) - Initial/Assessment Note    Patient Details  Name: Erika Day MRN: [id number] Date of Birth: [birth year]  [facility] CM/SW Contact:    Alberteen Sam, LCSW Phone Number: 06/29/2019, 1:51 PM  Clinical Narrative:                  CSW consulted with patient's daughter [name] who reported concerns regarding their current home health agency in use. She reports limited services and using [facility]. She requests CSW to search for different agencies that may be able to accept her mother, as well as increased services to include [place], OT, RN and aide. She also requests information regarding the CAP waitlist.   CSW will follow up with home health agencies to determine who will be able to provide services requested to patient at time of discharge.   Expected Discharge Plan: [place] Barriers to Discharge: Continued Medical Work up   Patient Goals and CMS Choice   CMS Medicare.gov Compare Post Acute Care list provided to:: Patient Represenative (must comment)(daughter [name]) Choice offered to / list presented to : Adult Children(daughter [name])  Expected Discharge Plan and Services Expected Discharge Plan: [place]     Post Acute Care Choice: [place] arrangements for the past 2 months: Single Family Home Expected Discharge Date: 07/01/19                                    Prior Living Arrangements/Services Living arrangements for the past 2 months: Single Family Home Lives with:: Self, Adult Children Patient language and need for interpreter reviewed:: Yes Do you feel safe going back to the place where you live?: Yes      Need for Family Participation in Patient Care: Yes (Comment) Care giver support system in place?: Yes (comment) Current home services: Home PT, Home OT Criminal Activity/Legal Involvement Pertinent to Current Situation/Hospitalization: No - Comment as  needed  Activities of Daily Living Home Assistive Devices/Equipment: Wheelchair, Shower chair with back ADL Screening (condition at time of admission) Patient's cognitive ability adequate to safely complete daily activities?: Yes Is the patient deaf or have difficulty hearing?: Yes Does the patient have difficulty seeing, even when wearing glasses/contacts?: Yes Does the patient have difficulty concentrating, remembering, or making decisions?: No Patient able to express need for assistance with ADLs?: Yes Does the patient have difficulty dressing or bathing?: Yes Independently performs ADLs?: No Communication: Independent Dressing (OT): Needs assistance Is this a change from baseline?: Pre-admission baseline Grooming: Needs assistance Is this a change from baseline?: Pre-admission baseline Feeding: Independent with device (comment) Bathing: Needs assistance Is this a change from baseline?: Pre-admission baseline Toileting: Dependent Is this a change from baseline?: Pre-admission baseline In/Out Bed: Dependent Is this a change from baseline?: Pre-admission baseline Walks in Home: Dependent Is this a change from baseline?: Pre-admission baseline Does the patient have difficulty walking or climbing stairs?: Yes Weakness of Legs: Both Weakness of Arms/Hands: Both  Permission Sought/Granted Permission sought to share information with : Case Manager, Customer service manager, Family Supports    Share Information with NAME: [name]  Permission granted to share info w AGENCY: [place] granted to share info w Relationship: daughter  Permission granted to share info w Contact Information: [phone]  Emotional Assessment Appearance:: Appears stated age     Orientation: :  Oriented to Self, Oriented to Place, Oriented to  Time, Oriented to Situation Alcohol / Substance Use: Not Applicable Psych Involvement: No (comment)  Admission diagnosis:  Acute  on chronic congestive heart failure, unspecified heart failure type [facility]) [I50.9] Patient Active Problem List   Diagnosis Date Noted  . Acute on chronic congestive heart failure ([place] ) 06/29/2019  . Acute CHF (congestive heart failure) ([place]) 06/29/2019  . CHF (congestive heart failure) ([place]) 06/28/2019  . Small bowel obstruction ([place]) 04/08/2018  . MGUS (monoclonal gammopathy of unknown significance) 02/11/2018  . History of diabetes mellitus 04/19/2017  . Hypercalcemia 02/12/2017  . Chronic leukopenia 02/12/2017  . Postoperative anemia due to acute blood loss 11/26/2016  . Fall 11/23/2016  . Closed fracture of intertrochanteric section of femur ([place]) 11/23/2016  . Other pancytopenia ([place]) 11/13/2016  . Sinusitis 10/14/2016  . Bleeding external hemorrhoids 08/20/2016  . Recurrent ventral incisional hernia s/p primary repair 06/30/2016 07/01/2016  . Pressure ulcer 05/20/2016  . Lower extremity edema 05/20/2016  . Protein-calorie malnutrition, severe ([place]) 05/20/2016  . Volvulus of sigmoid colon ([place])   . Sigmoid volvulus s/p lap sigmoid colectomy 06/30/2016 05/19/2016  . 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 Other fatigue 05/15/2016  . CAP (community acquired pneumonia) 02/13/2016  . Anemia, chronic disease 02/13/2016  . Thrombocytopenia ([place]) 02/13/2016  . Hyponatremia 02/13/2016  . Kyphosis 02/01/2016  . Pressure ulcer of coccygeal region 01/31/2016  . Mobility impaired 01/31/2016  . Osteoarthritis 01/31/2016  . Abdominal cramping 04/23/2014  . Essential hypertension 09/06/2011  . Ovarian torsion 08/25/2011   PCP:  Mack Hook, MD Pharmacy:   [facility] [address] [place] [address] Phone: [phone] Fax: [place], [place] [address] Phone: [phone] Fax: [phone]     Social Determinants of Health (SDOH) Interventions    Readmission Risk Interventions No flowsheet data found.

## 2019-06-29 NOTE — Progress Notes (Signed)
  Echocardiogram 2D Echocardiogram has been performed.  Burnett Kanaris 06/29/2019, 10:57 AM

## 2019-06-29 NOTE — Care Management Obs Status (Signed)
[place] NOTIFICATION   Patient Details  Name: AUTIANA BAULT MRN: [id number] Date of Birth: [birth year]   Medicare Observation Status Notification Given:  Yes    Zenon Mayo, RN 06/29/2019, 5:03 PM

## 2019-06-29 NOTE — Progress Notes (Signed)
Discharge instructions given to patient's daughter per patient request she verbalized understanding reported would be on her way to pick patient up. PIV removed with catheter intact. Tele removed CCMD informed of discharge home. Await arrival of family to pick patient up.

## 2019-06-30 NOTE — Progress Notes (Signed)
CSW followed up with Tiffany at [facility] who reports they will get orders for patient's Home Health from PCP since patient was discharge evening of 8/27. She reports patient is potential candidate for res vest which will be discussed with family by [facility].   [place], [place]

## 2019-07-04 ENCOUNTER — Other Ambulatory Visit: Payer: Self-pay

## 2019-07-04 ENCOUNTER — Telehealth: Payer: Self-pay | Admitting: Internal Medicine

## 2019-07-04 ENCOUNTER — Encounter: Payer: Self-pay | Admitting: Internal Medicine

## 2019-07-04 ENCOUNTER — Ambulatory Visit (INDEPENDENT_AMBULATORY_CARE_PROVIDER_SITE_OTHER): Payer: Medicare Other | Admitting: Internal Medicine

## 2019-07-04 VITALS — BP 134/80 | HR 66 | Resp 12

## 2019-07-04 DIAGNOSIS — I5033 Acute on chronic diastolic (congestive) heart failure: Secondary | ICD-10-CM | POA: Diagnosis not present

## 2019-07-04 DIAGNOSIS — R600 Localized edema: Secondary | ICD-10-CM | POA: Diagnosis not present

## 2019-07-04 DIAGNOSIS — S81802A Unspecified open wound, left lower leg, initial encounter: Secondary | ICD-10-CM

## 2019-07-04 NOTE — Telephone Encounter (Signed)
Also--to set up pill box.  I do not know if she is taking Furosemide, KCl or pantoprazole.  Not clear she needs the last one.

## 2019-07-04 NOTE — Progress Notes (Signed)
Subjective:    Patient ID: Erika Day, female   DOB: [birth year], 85 y.o.   MRN: [id number]   HPI   1.  Hospitalization [id number] for CHF/diastolic dysfunction for which she was diuresed.   Has a scale at home, but the battery is bad, so is taking the scale back to be replaced. She is not wearing compression stockings because of torn skin when stockings taken off in hospital.   Her swelling of legs is better than prior to hospitalization.   She is taking the Furosemide daily She believes she is taking her potassium daily. She is not certain her pill box is set up correctly.  2.  Abdominal discomfort:  Was placed back on Protonix.  Not clear she is taking this. She had diarrhea a couple weeks prior to her hospitalization and was told to stop the Miralax until resolved.  She has not restarted as her BMs are regular--eating more fiber.  3.  Left leg wound:  States this occurred when ?compression stockings removed in hospital--skin tore.  She has not been wearing the stockings at home. Initially states her legs are elevated, but does not sound like the leg attachments to her wheelchair on in place and not keeping her legs elevated.  Current Meds  Medication Sig  . acetaminophen (TYLENOL) 325 MG tablet Take 2 tablets (650 mg total) by mouth every 4 (four) hours as needed for mild pain, moderate pain, fever or headache.  . cetirizine (ZYRTEC) 10 MG tablet 1 tab by mouth once daily with evening meal for allergies (Patient taking differently: Take 10 mg by mouth daily as needed for allergies. )  . dicyclomine (BENTYL) 10 MG capsule 1 cap by mouth every 6 hours as needed for abdominal cramping. (Patient taking differently: Take 10 mg by mouth every 6 (six) hours as needed (abdominal cramping). )  . dorzolamide-timolol (COSOPT) 22.3-6.8 MG/ML ophthalmic solution Place 1 drop into both eyes 2 (two) times daily.  Water engineer Bandages & Supports (MEDICAL COMPRESSION STOCKINGS) MISC 20-30 mm Hg with  toe thigh high graduated compression stockings on every morning and off at night.  . furosemide (LASIX) 40 MG tablet Take 1 tablet (40 mg total) by mouth daily.  Marland Kitchen latanoprost (XALATAN) 0.005 % ophthalmic solution Place 1 drop into both eyes at bedtime.   Marland Kitchen lisinopril (ZESTRIL) 10 MG tablet Take 1 tablet (10 mg total) by mouth daily.  . Olopatadine HCl (PATADAY) 0.2 % SOLN 1 drop each eye daily (Patient taking differently: Place 1 drop into both eyes daily. )  . pantoprazole (PROTONIX) 40 MG tablet 1 tab by mouth daily at 10 pm on empty stomach (Patient taking differently: Take 40 mg by mouth daily. )  . potassium chloride (K-DUR) 10 MEQ tablet Take 1 tablet (10 mEq total) by mouth daily.  Marland Kitchen senna-docusate (SENOKOT-S) 8.6-50 MG tablet Take 2 tablets by mouth at bedtime as needed for mild constipation.   Allergies  Allergen Reactions  . Morphine And Related Other (See Comments)    Blood sugar dropped one-time     Review of Systems    Objective:   BP 134/80 (BP Location: Left Arm, Patient Position: Sitting, Cuff Size: Normal)   Pulse 66   Resp 12   Physical Exam  NAD Speaks in paragraphs without dyspnea. Lungs:  CTA CV:  RRR with SEM.  Normal and equal radial pulses. Abd:  S, + BS LE:  Left greater than right woody and pitting edema.  Has clear  to straw colored water filled vesicle in varying sizes ringing her left lower leg and ankle.  Open wound penny sized with superficial crusting from oozing surrounding the wound.  No significant erythema. Cleaned with Hibiclens and water and nonstick dressing applied with Kerlex and ACE wrap to avoid adhesives and further skin tear.   Assessment & Plan   1.  Left posterior lower leg wound:  This appears to be from blistering from edema of left leg greater than right.   Area cleaned and dried and dressing applied Will ask [facility] to see her daily for dressing changes. Suspect also she is not taking meds as ordered and needs  pillbox set back up--will ask them to set her up. Discussed with her if edema does not go down, this wound and the other vesicles would not heal and could lose her leg or foot. Will have her work with home health nurse to get her leg attachments back on her wheelchair to elevate.  2.  Diastolic CHF:  bp controlled.  Lungs clear, but peripheral edema present still. Needs pillbox set up as above. Respiratory status stable today. BMP with diuresis  3.  Abdominal complaints in hospital:  Not apparent today.  Do not believe she is likely on Pantoprazole.  Pillbox.

## 2019-07-04 NOTE — Telephone Encounter (Signed)
Spoke with [place] with [facility]. Informed they can do all the request that Dr. Amil Amen has asked. They only issue is wound care can not be done until Saturday the earliest. Per Dr. Amil Amen this fine.information given to [facility] and she verbalized understanding.

## 2019-07-04 NOTE — Telephone Encounter (Signed)
For [facility]: Needs daily dressing changes for L LE wound. Please see if they can get her leg attachments for her wheelchair reattached so she can easily elevate her legs when not cruising around the house. See if they can get PT back with her to work on some ambulation--to BR or front room. Work on Government social research officer down to [employer] not smoking in home--this sets off breathing issues with her as well.

## 2019-07-04 NOTE — Telephone Encounter (Signed)
Left detailed message for Darlina Guys to return my call but instructions were given in voicemail. Kecia contact # [phone].

## 2019-07-04 NOTE — Patient Instructions (Addendum)
Call if you have not heard from [facility] by this afternoon  [place]

## 2019-07-05 LAB — BASIC METABOLIC PANEL
BUN/Creatinine Ratio: 18 (ref 12–28)
BUN: 29 mg/dL — ABNORMAL HIGH (ref 8–27)
CO2: 24 mmol/L (ref 20–29)
Calcium: 9.6 mg/dL (ref 8.7–10.3)
Chloride: 100 mmol/L (ref 96–106)
Creatinine, Ser: 1.6 mg/dL — ABNORMAL HIGH (ref 0.57–1.00)
GFR calc Af Amer: 33 mL/min/{1.73_m2} — ABNORMAL LOW (ref >59)
GFR calc non Af Amer: 29 mL/min/{1.73_m2} — ABNORMAL LOW (ref >59)
Glucose: 83 mg/dL (ref 65–99)
Potassium: 4.6 mmol/L (ref 3.5–5.2)
Sodium: 139 mmol/L (ref 134–144)

## 2019-07-07 ENCOUNTER — Telehealth: Payer: Self-pay | Admitting: Internal Medicine

## 2019-07-07 NOTE — Telephone Encounter (Signed)
Estill Bamberg, Nurse at [facility] [phone] called requesting dressing changes for L LE wound twice a week x 4 weeks and after that 1 a week x 4 weeks.    Please advise

## 2019-07-10 NOTE — Telephone Encounter (Signed)
Please advise that I give those orders.

## 2019-07-12 NOTE — Telephone Encounter (Signed)
Left detailed message for amanda that Dr. Amil Amen did give the orders to continue with Erika Day wound care for left leg

## 2019-08-30 ENCOUNTER — Telehealth: Payer: Self-pay | Admitting: Internal Medicine

## 2019-08-30 DIAGNOSIS — R296 Repeated falls: Secondary | ICD-10-CM

## 2019-08-30 DIAGNOSIS — Z7409 Other reduced mobility: Secondary | ICD-10-CM

## 2019-08-30 DIAGNOSIS — I509 Heart failure, unspecified: Secondary | ICD-10-CM

## 2019-08-30 DIAGNOSIS — R600 Localized edema: Secondary | ICD-10-CM

## 2019-08-30 NOTE — Telephone Encounter (Signed)
To Dr. Mulberry for approval 

## 2019-08-30 NOTE — Telephone Encounter (Signed)
Desmond Dike, Nurse at [facility] [phone]. called requesting Dr. Amil Amen to place a order for a hospital bed with gel mattress cover for patient . Desmond Dike stated the one that patient has now is broken and so unconformable for her.   Please advise

## 2019-09-05 DIAGNOSIS — R296 Repeated falls: Secondary | ICD-10-CM | POA: Insufficient documentation

## 2019-09-05 NOTE — Telephone Encounter (Signed)
[facility]

## 2019-09-05 NOTE — Telephone Encounter (Signed)
Spoke with saundra with [facility]. States we will have to order it from [facility] ourselves. States they need a Rx that says specialty and ask for a hospital bed with gel mattress cover. Along with her approximate height and weight. States they can not order these items any longer due to COVID. States now all orders have to come from the doctor for things like this. Saundra phone # [phone] if any other questions.

## 2019-09-05 NOTE — Telephone Encounter (Signed)
Did they send paperwork?  I approve if they just need a verbal.

## 2019-09-05 NOTE — Telephone Encounter (Signed)
Called 3 times and line is busy

## 2019-09-15 ENCOUNTER — Other Ambulatory Visit: Payer: Self-pay

## 2019-09-15 ENCOUNTER — Ambulatory Visit (INDEPENDENT_AMBULATORY_CARE_PROVIDER_SITE_OTHER): Payer: Medicare Other

## 2019-09-15 DIAGNOSIS — Z23 Encounter for immunization: Secondary | ICD-10-CM

## 2019-10-03 ENCOUNTER — Ambulatory Visit: Payer: Medicare Other | Admitting: Internal Medicine

## 2019-10-05 ENCOUNTER — Ambulatory Visit (INDEPENDENT_AMBULATORY_CARE_PROVIDER_SITE_OTHER): Payer: Medicare Other | Admitting: Internal Medicine

## 2019-10-05 ENCOUNTER — Other Ambulatory Visit: Payer: Self-pay

## 2019-10-05 ENCOUNTER — Encounter: Payer: Self-pay | Admitting: Internal Medicine

## 2019-10-05 VITALS — BP 180/90 | HR 66 | Temp 98.4°F | Resp 12

## 2019-10-05 DIAGNOSIS — I10 Essential (primary) hypertension: Secondary | ICD-10-CM

## 2019-10-05 DIAGNOSIS — L03116 Cellulitis of left lower limb: Secondary | ICD-10-CM | POA: Diagnosis not present

## 2019-10-05 DIAGNOSIS — R600 Localized edema: Secondary | ICD-10-CM | POA: Diagnosis not present

## 2019-10-05 DIAGNOSIS — R059 Cough, unspecified: Secondary | ICD-10-CM

## 2019-10-05 DIAGNOSIS — R05 Cough: Secondary | ICD-10-CM

## 2019-10-05 LAB — POC INFLUENZA TEST: Negative: NEGATIVE

## 2019-10-05 MED ORDER — CEFTRIAXONE SODIUM 1 G IJ SOLR
1.0000 g | Freq: Once | INTRAMUSCULAR | Status: AC
  Administered 2019-10-05: 1 g via INTRAMUSCULAR

## 2019-10-05 NOTE — Patient Instructions (Signed)
Keep your legs elevated at all times. May rewrap leg with ACE wrap looser if gets too tight.

## 2019-10-05 NOTE — Progress Notes (Signed)
.    Subjective:    Patient ID: Erika Day, female   DOB: Jan 03, 1930, 85 y.o.   MRN: [id number]   HPI   1.  Hypertension:  Has not taken meds today.  Not clear what is happening with home health nurse.  She does have an home health aide, Erika Day, who takes her bp and has stated her bp has been okay. Erika Day cannot say how often her bp is checked.   She states she is out of some medication, not clear what she is doing with meds. States she is using her pill box and her health aid fills it most often.  2.  Left leg started to swell and started to ooze fluid in past 2 days.  States has been swollen for some time.  Has not used her compression stocking for at least 2 months.  States someone working with her with home health told her her stockings were too big and was going to order another pair but she has not seen him in some time and no new stockings.   She feels like she has a fever right now. No odor to the drainage from foot. She admits to not keeping her legs up--not in bed and not in wheel chair. Has not received the new hospital bed--[facility] states she refused when they called to set up delivery.   Patient states she never received a call and her daughter would not have declined as well    3. Coughing for 3 days now.  Phlegm in her throat.  Not sure if she has had a fever, but feels like she has had--felt hot and then cold.   Cannot bring up her phlegm.   Not clear if she feels dyspneic. No one else in home is ill.  Lives with her husband and 70 and 12 yo grandkids and daughter.  Her aid has been coughing and sneezing.  She has been telling them this is her allergies.  She has noted these symptoms since end of September.    Current Meds  Medication Sig  . acetaminophen (TYLENOL) 325 MG tablet Take 2 tablets (650 mg total) by mouth every 4 (four) hours as needed for mild pain, moderate pain, fever or headache.  . cetirizine (ZYRTEC) 10 MG tablet 1 tab by mouth once daily  with evening meal for allergies (Patient taking differently: Take 10 mg by mouth daily as needed for allergies. )  . dicyclomine (BENTYL) 10 MG capsule 1 cap by mouth every 6 hours as needed for abdominal cramping. (Patient taking differently: Take 10 mg by mouth every 6 (six) hours as needed (abdominal cramping). )  . dorzolamide-timolol (COSOPT) 22.3-6.8 MG/ML ophthalmic solution Place 1 drop into both eyes 2 (two) times daily.  . furosemide (LASIX) 40 MG tablet Take 1 tablet (40 mg total) by mouth daily.  Marland Kitchen latanoprost (XALATAN) 0.005 % ophthalmic solution Place 1 drop into both eyes at bedtime.   Marland Kitchen lisinopril (ZESTRIL) 10 MG tablet Take 1 tablet (10 mg total) by mouth daily.  . Olopatadine HCl (PATADAY) 0.2 % SOLN 1 drop each eye daily (Patient taking differently: Place 1 drop into both eyes daily. )  . pantoprazole (PROTONIX) 40 MG tablet 1 tab by mouth daily at 10 pm on empty stomach (Patient taking differently: Take 40 mg by mouth daily. )  . polyethylene glycol powder (GLYCOLAX/MIRALAX) powder Mix 17 g powder in 8 oz of water and drink once daily in the morning  .  potassium chloride (K-DUR) 10 MEQ tablet Take 1 tablet (10 mEq total) by mouth daily.  Marland Kitchen senna-docusate (SENOKOT-S) 8.6-50 MG tablet Take 2 tablets by mouth at bedtime as needed for mild constipation.   Allergies  Allergen Reactions  . Morphine And Related Other (See Comments)    Blood sugar dropped one-time     Review of Systems    Objective:   BP (!) 180/90 (BP Location: Left Arm, Patient Position: Sitting, Cuff Size: Normal)   Pulse 66   Temp 98.4 F (36.9 C) (Oral)   Resp 12   Physical Exam  No coughing in room.  Eyes watering Chest:  CTA CV:  RRR with unchanged systolic murmur. Left leg with swelling to upper calf area with increased warmth and mild erythema. Compressed about lower part of calf and ankle from dressing and foot swollen again below.  Foot with good color and cap refill.  Able to palpate DP  pulse 3 small areas of superficial skin breakdown with relativelyclear serosanguinous drainage.  Assessment & Plan   1.  Hypertension:  Poorly controlled.  Suspect not taking meds.  Will clarify tomorrow when perform home visit.  2.  Cough:  Check flu.  Could very well be due to allergies as eyes are running significantly today and not clear taking meds.  3.  Left leg cellulitis/edema:  Working with [facility] to see about getting a better bed.  Not clear what happened. Checking with Erika Day on home visits and checking on compression stockings and getting meds set up. Ceftriaxone 1 g IM today.  Home visit tomorrow to give abx and recheck as well. Leg redressed and will perform again tomorrow.

## 2019-10-06 ENCOUNTER — Telehealth: Payer: Self-pay | Admitting: Internal Medicine

## 2019-10-06 ENCOUNTER — Other Ambulatory Visit (INDEPENDENT_AMBULATORY_CARE_PROVIDER_SITE_OTHER): Payer: Medicare Other | Admitting: Internal Medicine

## 2019-10-06 ENCOUNTER — Telehealth: Payer: Self-pay

## 2019-10-06 ENCOUNTER — Other Ambulatory Visit: Payer: Self-pay

## 2019-10-06 DIAGNOSIS — R600 Localized edema: Secondary | ICD-10-CM | POA: Diagnosis not present

## 2019-10-06 DIAGNOSIS — L03116 Cellulitis of left lower limb: Secondary | ICD-10-CM

## 2019-10-06 DIAGNOSIS — I10 Essential (primary) hypertension: Secondary | ICD-10-CM

## 2019-10-06 MED ORDER — FUROSEMIDE 40 MG PO TABS: 40.0000 mg | ORAL_TABLET | Freq: Every day | ORAL | 11 refills | Status: DC

## 2019-10-06 MED ORDER — LISINOPRIL 10 MG PO TABS: 10.0000 mg | ORAL_TABLET | Freq: Every day | ORAL | 11 refills | Status: DC

## 2019-10-06 MED ORDER — CEPHALEXIN 500 MG PO CAPS: ORAL_CAPSULE | ORAL | 0 refills | Status: DC

## 2019-10-06 MED ORDER — CEFTRIAXONE SODIUM 1 G IJ SOLR
1.0000 g | Freq: Once | INTRAMUSCULAR | Status: AC
  Administered 2019-10-06: 15:00:00 1 g via INTRAMUSCULAR

## 2019-10-06 MED ORDER — PANTOPRAZOLE SODIUM 40 MG PO TBEC: DELAYED_RELEASE_TABLET | ORAL | 11 refills | Status: DC

## 2019-10-06 MED ORDER — CETIRIZINE HCL 10 MG PO TABS: ORAL_TABLET | ORAL | 11 refills | Status: DC

## 2019-10-06 MED ORDER — POTASSIUM CHLORIDE ER 10 MEQ PO TBCR: 10.0000 meq | EXTENDED_RELEASE_TABLET | Freq: Every day | ORAL | 0 refills | Status: DC

## 2019-10-06 NOTE — Telephone Encounter (Signed)
Spoke with [place] home health aide. Gave specific instructions for what she needs to with patient over the weekend. [name] verbalized understanding of changing her dressing and making sure she puts ointment on the dressing so it does not stick to patients skin and also states she will fill her pill box weekly. According to [facility] she has been giving Mrs. Amster her medication everyday.

## 2019-10-06 NOTE — Telephone Encounter (Signed)
Also--needs to keep leg up at all times. To start Cephalexin twice daily on Saturday morning. I filled out her pill box and need [name] to fill this every week. Furosemide and Lisinopril plus potassium in the morning(currently no potassium) Pantoprazole in the evening at bedtime. Please call the pharmacy and get rid of all old prescriptions. Should be on 10 mg of Lisinopril, 40 mg of Furosemide.  I just sent in the KCl, and 40 mg of Pantoprazole. She has not been filling her allergy meds, but should.

## 2019-10-06 NOTE — Progress Notes (Signed)
    Subjective:    Patient ID: Erika Day, female   DOB: Aug 26, 1930, 85 y.o.   MRN: [id number]   HPI   Home Visit She does not appear to be taking any of her meds and has different strengths of both Lisinopril (10 and 20 mg)and Furosemide (20 and 40 mg).  Culled the meds that do not match with her current listed regimen. She is out of potassium--was on this at discharge from hospitalization with CHF earlier in fall and with increase in Furosemide to 40 mg daily.  No outpatient medications have been marked as taking for the 10/06/19 encounter (Appointment) with Mack Hook, MD.   Allergies  Allergen Reactions  . Morphine And Related Other (See Comments)    Blood sugar dropped one-time     Review of Systems    Objective:   There were no vitals taken for this visit.  Physical Exam  Left leg with decreased erythema and warmth above bandage, but still with significant edema above and below bandage.   Weeping open wound at posterior lower calf, upper ankle without pustular discharge or surrounding erythema. Home is in disarray and not clean.  Discussed who is to be cleaning and helping them out.    Assessment & Plan   1.  Left lower extremity edema/wound/cellulitis:  Ceftriaxone 1 g given in Left gluteal upper outer quad. Need to speak with [facility] and Ms. Hammer, her daily aide and caregiver as to what is supposed to be happening. Waiting to hear about the previously ordered hospital bed as well. To continue to elevate legs. Cephalexin 500 mg twice daily by mouth to start tomorrow Filled her pill box with available meds.  She has obviously not been taking meds as bottles are 30 day supply and dated either in July or October and far beyond when they should have been finished. Furosemide 40 mg daily in morning. Lisinopril 10 mg in morning. KCL ordered 10 mEq daily in morning.  2.  Hypertension:  She is not taking her Lisinopril nor Furosemide.  Pillbox as  above. Still not clear why her pillbox is not being filled weekly. Multiple attempts to get this straightened out with daughters and aide in past.  3.  Poor social situation:  Checking with caregivers as above as to why some care was discontinued and why her care continues to be interrupted.

## 2019-10-06 NOTE — Telephone Encounter (Signed)
Spoke with Abby at [facility]. Informed she received Rx from dr. Amil Amen today. Per Dr. Amil Amen just cancel all Rx's and send in new ones. Informed Abby to cancel all other Rx;s and I sent over all new Rx's for patient to pick tomorrow. [name] is aware patient needs meds picked up.

## 2019-10-06 NOTE — Telephone Encounter (Signed)
Left message for nursing supervisor to call back. Informed patient had been discharged from [facility]. Left message for nursing supervisor to call to see if we can get patient set back up with [facility]. Patient needs wound care follow up.

## 2019-10-09 LAB — NOVEL CORONAVIRUS, NAA: SARS-CoV-2, NAA: NOT DETECTED

## 2019-10-09 NOTE — Telephone Encounter (Signed)
Nursing supervisor called this morning and will restart services just need OV mor 10/05/2019. Notes have been faxed over to [facility]

## 2019-10-10 ENCOUNTER — Other Ambulatory Visit: Payer: Self-pay

## 2019-10-10 ENCOUNTER — Ambulatory Visit (INDEPENDENT_AMBULATORY_CARE_PROVIDER_SITE_OTHER): Payer: Medicare Other | Admitting: Internal Medicine

## 2019-10-10 ENCOUNTER — Encounter: Payer: Self-pay | Admitting: Internal Medicine

## 2019-10-10 VITALS — BP 148/88 | HR 78 | Resp 12

## 2019-10-10 DIAGNOSIS — L03116 Cellulitis of left lower limb: Secondary | ICD-10-CM | POA: Diagnosis not present

## 2019-10-10 DIAGNOSIS — I10 Essential (primary) hypertension: Secondary | ICD-10-CM

## 2019-10-10 DIAGNOSIS — R600 Localized edema: Secondary | ICD-10-CM

## 2019-10-10 NOTE — Progress Notes (Signed)
Subjective:    Patient ID: Erika Day, female   DOB: September 27, 1930, 85 y.o.   MRN: [id number]   HPI   Left leg edema and cellulitis with posterior lower leg skin breakdown. No pain. No fevers now. Continues on Cephalexin 500 mg twice daily.  Had two days of Ceftriaxone 1 g  Once daily. Received her new hospital bed yesterday. Did not keep her leg up overnight.  Cannot get a clear answer why.   Current Meds  Medication Sig  . cephALEXin (KEFLEX) 500 MG capsule 1 cap by mouth twice daily for 5 days  . cetirizine (ZYRTEC) 10 MG tablet 1 tab by mouth once daily with evening meal for allergies  . dorzolamide-timolol (COSOPT) 22.3-6.8 MG/ML ophthalmic solution Place 1 drop into both eyes 2 (two) times daily.  . furosemide (LASIX) 40 MG tablet Take 1 tablet (40 mg total) by mouth daily.  Marland Kitchen latanoprost (XALATAN) 0.005 % ophthalmic solution Place 1 drop into both eyes at bedtime.   Marland Kitchen lisinopril (ZESTRIL) 10 MG tablet Take 1 tablet (10 mg total) by mouth daily.  . pantoprazole (PROTONIX) 40 MG tablet 1 tab by mouth daily at 10 pm on empty stomach  . potassium chloride (KLOR-CON) 10 MEQ tablet Take 1 tablet (10 mEq total) by mouth daily.   Allergies  Allergen Reactions  . Morphine And Related Other (See Comments)    Blood sugar dropped one-time     Review of Systems    Objective:   BP (!) 168/90 (BP Location: Left Arm, Patient Position: Sitting, Cuff Size: Normal)   Pulse 78   Resp 12   Physical Exam  NAD Eyes:  Watery Chest:  CTA CV: RRR with SEM unchanged Left leg:  Still with edema, particularly above and below ankle/lower pretibial area dressing.  No erythema today, no increased warmth.   Weeping lesions at back of lower leg appear healthy and without purulent discharge or erythema.    Assessment & Plan   1.  Left leg edema:  No sure how to get patient to understand the importance of keep her leg elevated. Wound re dressed and wrapped.   Daughter here with her  today and hopefully will continue to work with her on elevation. Discussed how to use her new bed to keep leg elevated and with rolled blankets or pillows.  2.  Hypertension:  BP not quite at goal yet after restart of Lisinopril and Furosemide.  Follow up in about 1 week.  BMP then  Called her Health Aid, Ms. Hammer.   Ms Esmeralda Links states she has legs dangling over side of bed due to her granddaughter sleeping with her who tosses and turns (mother drops her off at 4 a.m. as she heads to work) Consider a body pillow to put in between them.  She states they are not good about getting her medication picked up on time even when they get notified of need to pick up.  Will need to see if one of the daughters can just pick up on a designated day every month. Encouraged her to notify the family and the clinic when she fills the last week of her pill box so they can pick up then and not run out. Someone should check the pillbox regularly to be certain she is taking meds. Ms Esmeralda Links states she tries to keep the home clean, but they drop drink and food on the floor and use a wipe to semi clean. They leave food out that  attracts roaches. They treat the home every 3 weeks for roaches, but doesn't do any good due to poor food storage. She states they do not seem concerned with the filth in the home. Ms. Esmeralda Links is trying to cook low sodium meals, but often [place] is not interested in the meals.

## 2019-10-16 ENCOUNTER — Ambulatory Visit: Payer: Medicare Other | Admitting: Internal Medicine

## 2019-10-18 ENCOUNTER — Telehealth: Payer: Self-pay | Admitting: Internal Medicine

## 2019-10-18 NOTE — Telephone Encounter (Signed)
[name] from [facility] to ask for authorization to continue PT with pt.  Authorized-

## 2019-10-20 ENCOUNTER — Telehealth: Payer: Self-pay | Admitting: Internal Medicine

## 2019-10-20 ENCOUNTER — Ambulatory Visit (INDEPENDENT_AMBULATORY_CARE_PROVIDER_SITE_OTHER): Payer: Medicare Other | Admitting: Internal Medicine

## 2019-10-20 DIAGNOSIS — M7021 Olecranon bursitis, right elbow: Secondary | ICD-10-CM

## 2019-10-20 NOTE — Telephone Encounter (Signed)
Spoke to [place] and informed Dr. Amil Amen went over to the patient's home to evaluate and is not an infection. Dr. Amil Amen had to come back to the clinic to her other patient scheduled but will go back to patients home  Dr. Amil Amen requested for Saundra to measure ankle, calf and thigh of both legs so stockings can be ordered. Ennis Forts states she will put a verbal order in and have the skills nurse take measurements of the patient and all information will entered in the order and will be faxed over to Dr. Amil Amen to sign. Order to be expected at our office via fax next week  Routing to [place] as FYI

## 2019-10-20 NOTE — Telephone Encounter (Signed)
Saundra with [facility] called to inform Dr. Amil Amen that went to Ms. Stanley this morning to changed dressing on pt. Leg and found out patient has right elbow swelling, painful, warm to the touch with a  little bit of redness since last night. Ennis Forts stated is not sure what happened or if that is an infection pt is having there. Ennis Forts also stated patient will need compression stocking after leg  heals completely;  patient has one right now but is so tight for her.  Ennis Forts #'s [phone]

## 2019-10-23 NOTE — Telephone Encounter (Signed)
noted 

## 2019-12-04 ENCOUNTER — Ambulatory Visit (INDEPENDENT_AMBULATORY_CARE_PROVIDER_SITE_OTHER): Payer: Medicare Other | Admitting: Internal Medicine

## 2019-12-04 ENCOUNTER — Encounter: Payer: Self-pay | Admitting: Internal Medicine

## 2019-12-04 VITALS — BP 118/72 | HR 74 | Resp 12

## 2019-12-04 DIAGNOSIS — R600 Localized edema: Secondary | ICD-10-CM

## 2019-12-04 DIAGNOSIS — I10 Essential (primary) hypertension: Secondary | ICD-10-CM

## 2019-12-04 DIAGNOSIS — Z7409 Other reduced mobility: Secondary | ICD-10-CM

## 2019-12-04 DIAGNOSIS — R296 Repeated falls: Secondary | ICD-10-CM

## 2019-12-04 DIAGNOSIS — I509 Heart failure, unspecified: Secondary | ICD-10-CM

## 2019-12-04 DIAGNOSIS — E785 Hyperlipidemia, unspecified: Secondary | ICD-10-CM

## 2019-12-04 NOTE — Progress Notes (Signed)
    Subjective:    Patient ID: Erika Day, female   DOB: 1930/10/24, 86 y.o.   MRN: [id number]   HPI   Here for acute visit with diarrhea and falls.  Started drinking Ensure this past week and subsequently could not have a BM.  Was taking Miralax and was drinking more water, but adding sugar free flavoring. She used an enema 2 nights ago.  Has had loose stools just in past 24 hours or so.  Care 1--[name] not cooking food the way they like  Falls:  Occurs with transfers to and from wheelchair. Has brakes on. When goes to turn, her left leg gives out.    Current Meds  Medication Sig  . acetaminophen (TYLENOL) 325 MG tablet Take 2 tablets (650 mg total) by mouth every 4 (four) hours as needed for mild pain, moderate pain, fever or headache.  . dorzolamide-timolol (COSOPT) 22.3-6.8 MG/ML ophthalmic solution Place 1 drop into both eyes 2 (two) times daily.  Water engineer Bandages & Supports (MEDICAL COMPRESSION STOCKINGS) MISC 20-30 mm Hg with toe thigh high graduated compression stockings on every morning and off at night.  . furosemide (LASIX) 40 MG tablet Take 1 tablet (40 mg total) by mouth daily.  Marland Kitchen latanoprost (XALATAN) 0.005 % ophthalmic solution Place 1 drop into both eyes at bedtime.   Marland Kitchen lisinopril (ZESTRIL) 10 MG tablet Take 1 tablet (10 mg total) by mouth daily.  . pantoprazole (PROTONIX) 40 MG tablet 1 tab by mouth daily at 10 pm on empty stomach  . potassium chloride (KLOR-CON) 10 MEQ tablet Take 1 tablet (10 mEq total) by mouth daily.   Allergies  Allergen Reactions  . Morphine And Related Other (See Comments)    Blood sugar dropped one-time     Review of Systems    Objective:   BP 118/72 (BP Location: Left Arm, Patient Position: Sitting, Cuff Size: Normal)   Pulse 74   Resp 12   Physical Exam  NAD Wheelchair bound HEENT:  MMM Lungs:  clear CV:  RRR with systolic murmur unchanged.  Radial pulses normal and equal Abd:  S, NT, No HSM or mass, + BS Neuro:  No  localized weakness of LLE, though with pitting edema, left worse than right lower leg. Assessment & Plan  1.  Diarrhea:  Discuss her diet, which is not good.  Does not eat what her aide cooks and then snacks all day on junk food.  Also, discussed he effort to treat constipation are the likely cause of current diarrhea.  Discussed stopping the sugar free sweetener for her water and just drink water.  Hold Miralax and enemas.   Report back in 24 hours  2.  Home care:  Discussed with [name] and [name] that they need to have a family discussion regarding [name], her health aide and whether they can come to an agreement on cleaning the home and what food is cooked.  Suspect both sides need to work on this.  If this does not remedy the situation, they need to hire someone else.  3.  Difficulties with transfers:  Will have [facility] nursing and PT reevaluate left leg and transfers.  4.  Labs:  FLP, CBC, CMP  5.  Hypertension and CHF:  Stable.

## 2019-12-04 NOTE — Patient Instructions (Signed)
Have a family discussion with Erika Day about the food/roach issue and how everyone needs to work to make things better.

## 2019-12-05 LAB — LIPID PANEL W/O CHOL/HDL RATIO
Cholesterol, Total: 157 mg/dL (ref 100–199)
HDL: 47 mg/dL (ref >39)
LDL Chol Calc (NIH): 96 mg/dL (ref 0–99)
Triglycerides: 74 mg/dL (ref 0–149)
VLDL Cholesterol Cal: 14 mg/dL (ref 5–40)

## 2019-12-05 LAB — COMPREHENSIVE METABOLIC PANEL
ALT: 13 IU/L (ref 0–32)
AST: 30 IU/L (ref 0–40)
Albumin/Globulin Ratio: 1.5 (ref 1.2–2.2)
Albumin: 4.2 g/dL (ref 3.6–4.6)
Alkaline Phosphatase: 76 IU/L (ref 39–117)
BUN/Creatinine Ratio: 18 (ref 12–28)
BUN: 33 mg/dL — ABNORMAL HIGH (ref 8–27)
Bilirubin Total: 0.5 mg/dL (ref 0.0–1.2)
CO2: 22 mmol/L (ref 20–29)
Calcium: 10 mg/dL (ref 8.7–10.3)
Chloride: 100 mmol/L (ref 96–106)
Creatinine, Ser: 1.83 mg/dL — ABNORMAL HIGH (ref 0.57–1.00)
GFR calc Af Amer: 28 mL/min/{1.73_m2} — ABNORMAL LOW (ref >59)
GFR calc non Af Amer: 24 mL/min/{1.73_m2} — ABNORMAL LOW (ref >59)
Globulin, Total: 2.8 g/dL (ref 1.5–4.5)
Glucose: 111 mg/dL — ABNORMAL HIGH (ref 65–99)
Potassium: 5.5 mmol/L — ABNORMAL HIGH (ref 3.5–5.2)
Sodium: 138 mmol/L (ref 134–144)
Total Protein: 7 g/dL (ref 6.0–8.5)

## 2019-12-05 LAB — CBC WITH DIFFERENTIAL/PLATELET

## 2019-12-09 NOTE — Progress Notes (Signed)
Home visit --acute for swelling and pain over right elbow.    Fluid filled swelling overlying tip of olecranon with no significant erythema and only mild tenderness. Patient using elbows to push herself up in wheelchair and admits to leaning on elbows regularly.   She does have and ACE wrap or elbow sleeve she can wrap over area.  A/P  Olecranon bursitis:  Appears to be a mechanical issue and not infectious.  To wrap with ACE or sleeve regularly. Avoid pressure to the area. Call if increased pain, swelling or redness, fever.

## 2019-12-23 ENCOUNTER — Ambulatory Visit: Payer: Medicare Other

## 2019-12-26 ENCOUNTER — Telehealth: Payer: Self-pay | Admitting: Internal Medicine

## 2019-12-26 NOTE — Telephone Encounter (Signed)
[name]; home care aid called stating Erika Day needs Rx on all her meds except furosemide. She stated she call the pharmacy and was told medications needs authorization from Pt. Doctor in order to be fill.  Meds to be sent in at  default pharmacy on pt's chart.  Please advise.

## 2019-12-28 NOTE — Telephone Encounter (Signed)
Spoke with Erika Day and home health aide [place]. [name] spoke with Dr. Amil Amen and agreed to manage Erika Day medications and making sure she is getting refills and picking up her medication and taking medication appropriately. Both [name] and [place] verbalized understanding.

## 2019-12-29 ENCOUNTER — Ambulatory Visit: Payer: Medicare Other | Admitting: Internal Medicine

## 2020-01-05 ENCOUNTER — Other Ambulatory Visit: Payer: Self-pay | Admitting: Internal Medicine

## 2020-01-15 ENCOUNTER — Other Ambulatory Visit: Payer: Self-pay | Admitting: Hematology and Oncology

## 2020-01-15 DIAGNOSIS — C8584 Other specified types of non-Hodgkin lymphoma, lymph nodes of axilla and upper limb: Secondary | ICD-10-CM

## 2020-01-16 ENCOUNTER — Inpatient Hospital Stay: Payer: Medicare Other | Admitting: Hematology and Oncology

## 2020-01-22 ENCOUNTER — Other Ambulatory Visit: Payer: Self-pay

## 2020-01-22 ENCOUNTER — Telehealth: Payer: Self-pay | Admitting: Internal Medicine

## 2020-01-22 ENCOUNTER — Inpatient Hospital Stay: Payer: Medicare Other | Attending: Hematology and Oncology | Admitting: Hematology and Oncology

## 2020-01-22 DIAGNOSIS — Z79899 Other long term (current) drug therapy: Secondary | ICD-10-CM | POA: Insufficient documentation

## 2020-01-22 DIAGNOSIS — C8584 Other specified types of non-Hodgkin lymphoma, lymph nodes of axilla and upper limb: Secondary | ICD-10-CM

## 2020-01-22 DIAGNOSIS — H548 Legal blindness, as defined in USA: Secondary | ICD-10-CM | POA: Insufficient documentation

## 2020-01-22 DIAGNOSIS — I509 Heart failure, unspecified: Secondary | ICD-10-CM | POA: Insufficient documentation

## 2020-01-22 DIAGNOSIS — R54 Age-related physical debility: Secondary | ICD-10-CM | POA: Insufficient documentation

## 2020-01-22 DIAGNOSIS — Z7189 Other specified counseling: Secondary | ICD-10-CM

## 2020-01-22 DIAGNOSIS — R600 Localized edema: Secondary | ICD-10-CM | POA: Diagnosis not present

## 2020-01-22 DIAGNOSIS — C8304 Small cell B-cell lymphoma, lymph nodes of axilla and upper limb: Secondary | ICD-10-CM | POA: Diagnosis present

## 2020-01-22 NOTE — Telephone Encounter (Signed)
Erika Day; patient's daughter called requesting a call back from Dr. Amil Amen to talk regarding patient's consultation today at the [facility]. Asked Erika Day for the nature of the call who stated was told at the cancer center if there are any question to contact patient's primary care for any clarification she may need.

## 2020-01-23 ENCOUNTER — Encounter: Payer: Self-pay | Admitting: Hematology and Oncology

## 2020-01-23 DIAGNOSIS — Z7189 Other specified counseling: Secondary | ICD-10-CM | POA: Insufficient documentation

## 2020-01-23 NOTE — Assessment & Plan Note (Signed)
I have extensive goals of care discussion with the patient and family She is legally blind with multiple comorbidities and have difficulties taking care of herself The patient has not been able to make medical decision about CODE STATUS but her family appears to be leaning towards DO NOT RESUSCITATE As above, I do not feel strongly that the patient should come back here for future follow-up and they are in agreement

## 2020-01-23 NOTE — Assessment & Plan Note (Signed)
She has recent hospitalization for congestive heart failure Clinically, she has significant bilateral lower extremity edema I would defer to her primary care doctor for medical management

## 2020-01-23 NOTE — Telephone Encounter (Signed)
To Dr. Amil Amen for further follow up

## 2020-01-23 NOTE — Assessment & Plan Note (Signed)
Clinically, with limited exam, I did not feel any new lymphadenopathy She is extremely frail She is legally blind, have difficulties taking care of herself and with recurrent issues with congestive heart failure I spent a lot of time talking to her caregiver I do not recommend future follow-up, as the patient is unlikely able to tolerate any form of systemic treatment if she has lymphoma relapse

## 2020-01-23 NOTE — Progress Notes (Signed)
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[place]) Clinically, with limited exam, I did not feel any new lymphadenopathy She is extremely frail She is legally blind, have difficulties taking care of herself and with recurrent issues with congestive heart failure I spent a lot of time talking to her caregiver I do not recommend future follow-up, as the patient is unlikely able to tolerate any form of systemic treatment if she has lymphoma relapse  CHF (congestive heart failure) ([place]) She has recent hospitalization for congestive heart failure Clinically, she has significant bilateral lower extremity edema I would defer to her primary care doctor for medical management  Goals of care, counseling/discussion I have extensive goals of care discussion with the patient and family She is legally blind with multiple comorbidities and have difficulties taking care of herself The patient has not been able to make medical decision about CODE STATUS but her family appears to be leaning towards DO NOT RESUSCITATE As above, I do not feel strongly that the patient should come back here for future follow-up and they are in agreement   No orders of the defined types were placed in this encounter.   All questions were answered. The patient knows to call the clinic with any problems, questions or concerns. The total time spent in the appointment was 20 minutes encounter with patients including review of chart and various tests results, discussions about plan of care and coordination of care plan   Heath Lark, MD 01/23/2020 8:11 AM  INTERVAL HISTORY: Please see below for problem  oriented charting. 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[place])  05/18/2015 Imaging   Ct abdomen and pelvis showed reactive lymphadenopathy and stool filled colon   02/13/2016 Imaging   Ct chest showed pneumonia   05/08/2016 Imaging   screening mammogram revealed right axillary lymphadenopathy   05/12/2016 Procedure   She underwent core needle biopsy of axillary LN   05/12/2016 Pathology Results   [id number] Biopsy showed low grade non-Hodgkin B cell lymphoma, likely marginal zone lymphoma   05/15/2016 Pathology Results   Peripheral blood positive for deletion 13q   05/25/2016 PET scan   Mildly hypermetabolic mediastinal, left axillary and right inguinal lymph nodes. 2. Mild hypermetabolism in the left thyroid, nonspecific   06/27/2016 Imaging   Ct abdomen showed sigmoid volvulus resulting in large bowel obstruction. Splenomegaly, in keeping with patient's history of lymphoma.    06/30/2016 Pathology Results   Accession: [id number] pathology specimen showed low grade lymphoma   06/30/2016 Surgery   She had LAPAROSCOPIC SIGMOID COLECTOMY,  Rigid sigmoidoscopy, with Primary repair recurrent incisional hernia      11/13/2016 PET scan   No evidence disease progression. 2. Mild decrease in metabolic activity in axillary lymph nodes. 3. Similar metabolic activity of small mediastinal lymph nodes [place] 3 . 4. Similar activity in inguinal lymph  nodes. 5. No abnormal bowel activity identified     REVIEW OF SYSTEMS:   Constitutional: Denies fevers, chills or abnormal  weight loss Eyes: Denies blurriness of vision Ears, nose, mouth, throat, and face: Denies mucositis or sore throat Respiratory: Denies cough, dyspnea or wheezes Cardiovascular: Denies palpitation, chest discomfort or lower extremity swelling Gastrointestinal:  Denies nausea, heartburn or change in bowel habits Skin: Denies abnormal skin rashes Lymphatics: Denies new lymphadenopathy or easy bruising Neurological:Denies numbness, tingling or new weaknesses Behavioral/Psych: Mood is stable, no new changes  All other systems were reviewed with the patient and are negative.  I have reviewed the past medical history, past surgical history, social history and family history with the patient and they are unchanged from previous note.  ALLERGIES:  is allergic to morphine and related.  MEDICATIONS:  Current Outpatient Medications  Medication Sig Dispense Refill  . cetirizine (ZYRTEC) 10 MG tablet 1 tab by mouth once daily with evening meal for allergies (Patient not taking: Reported on 12/04/2019) 30 tablet 11  . furosemide (LASIX) 40 MG tablet Take 1 tablet (40 mg total) by mouth daily. 30 tablet 11  . lisinopril (ZESTRIL) 10 MG tablet Take 1 tablet (10 mg total) by mouth daily. 30 tablet 11  . pantoprazole (PROTONIX) 40 MG tablet 1 tab by mouth daily at 10 pm on empty stomach 30 tablet 11  . potassium chloride (KLOR-CON) 10 MEQ tablet TAKE 1 TABLET(10 MEQ) BY MOUTH DAILY 30 tablet 0   No current facility-administered medications for this visit.    PHYSICAL EXAMINATION: ECOG PERFORMANCE STATUS: 2 - Symptomatic, <50% confined to bed  Vitals:   01/22/20 1112  BP: (!) 161/76  Pulse: (!) 58  Resp: 18  Temp: 98.5 F (36.9 C)  SpO2: 100%   There were no vitals filed for this visit.  GENERAL:alert, limited examination as the patient is in a stooped posture sitting on the wheelchair SKIN: skin color, texture, turgor are normal, no rashes or significant lesions EYES: normal, Conjunctiva are  pink and non-injected, sclera clear OROPHARYNX:no exudate, no erythema and lips, buccal mucosa, and tongue normal  NECK: supple, thyroid normal size, non-tender, without nodularity LYMPH:  no palpable lymphadenopathy in the cervical, axillary or inguinal LUNGS: clear to auscultation and percussion with normal breathing effort HEART: regular rate & rhythm with left systolic murmurs and moderate bilateral lower extremity edema ABDOMEN:abdomen soft, non-tender and normal bowel sounds Musculoskeletal:no cyanosis of digits and no clubbing  NEURO: alert & oriented x 3 with fluent speech, no focal motor/sensory deficits  LABORATORY DATA:  I have reviewed the data as listed    Component Value Date/Time   NA 138 12/04/2019 1310   NA 141 02/19/2017 1111   K 5.5 (H) 12/04/2019 1310   K 4.3 02/19/2017 1111   CL 100 12/04/2019 1310   CO2 22 12/04/2019 1310   CO2 26 02/19/2017 1111   GLUCOSE 111 (H) 12/04/2019 1310   GLUCOSE 99 06/29/2019 0106   GLUCOSE 72 02/19/2017 1111   BUN 33 (H) 12/04/2019 1310   BUN 16.7 02/19/2017 1111   CREATININE 1.83 (H) 12/04/2019 1310   CREATININE 1.14 (H) 12/01/2018 1239   CREATININE 0.9 02/19/2017 1111   CALCIUM 10.0 12/04/2019 1310   CALCIUM 10.2 02/19/2017 1111   PROT 7.0 12/04/2019 1310   PROT 7.5 02/19/2017 1111   ALBUMIN 4.2 12/04/2019 1310   ALBUMIN 3.8 02/19/2017 1111   AST 30 12/04/2019 1310   AST 10 (L) 12/01/2018 1239   AST 14  02/19/2017 1111   ALT 13 12/04/2019 1310   ALT <6 12/01/2018 1239   ALT 8 02/19/2017 1111   ALKPHOS 76 12/04/2019 1310   ALKPHOS 92 02/19/2017 1111   BILITOT 0.5 12/04/2019 1310   BILITOT 0.4 12/01/2018 1239   BILITOT 0.36 02/19/2017 1111   GFRNONAA 24 (L) 12/04/2019 1310   GFRNONAA 44 (L) 12/01/2018 1239   GFRAA 28 (L) 12/04/2019 1310   GFRAA 50 (L) 12/01/2018 1239    No results found for: SPEP, UPEP  Lab Results  Component Value Date   WBC CANCELED 12/04/2019   NEUTROABS 1.7 06/29/2019   HGB CANCELED  12/04/2019   HCT CANCELED 12/04/2019   MCV 91.4 06/29/2019   PLT CANCELED 12/04/2019      Chemistry      Component Value Date/Time   NA 138 12/04/2019 1310   NA 141 02/19/2017 1111   K 5.5 (H) 12/04/2019 1310   K 4.3 02/19/2017 1111   CL 100 12/04/2019 1310   CO2 22 12/04/2019 1310   CO2 26 02/19/2017 1111   BUN 33 (H) 12/04/2019 1310   BUN 16.7 02/19/2017 1111   CREATININE 1.83 (H) 12/04/2019 1310   CREATININE 1.14 (H) 12/01/2018 1239   CREATININE 0.9 02/19/2017 1111      Component Value Date/Time   CALCIUM 10.0 12/04/2019 1310   CALCIUM 10.2 02/19/2017 1111   ALKPHOS 76 12/04/2019 1310   ALKPHOS 92 02/19/2017 1111   AST 30 12/04/2019 1310   AST 10 (L) 12/01/2018 1239   AST 14 02/19/2017 1111   ALT 13 12/04/2019 1310   ALT <6 12/01/2018 1239   ALT 8 02/19/2017 1111   BILITOT 0.5 12/04/2019 1310   BILITOT 0.4 12/01/2018 1239   BILITOT 0.36 02/19/2017 1111

## 2020-01-31 ENCOUNTER — Other Ambulatory Visit: Payer: Self-pay | Admitting: Internal Medicine

## 2020-03-04 ENCOUNTER — Ambulatory Visit: Payer: Medicare Other | Admitting: Internal Medicine

## 2020-03-05 ENCOUNTER — Ambulatory Visit (INDEPENDENT_AMBULATORY_CARE_PROVIDER_SITE_OTHER): Payer: Medicare Other | Admitting: Internal Medicine

## 2020-03-05 ENCOUNTER — Encounter: Payer: Self-pay | Admitting: Internal Medicine

## 2020-03-05 ENCOUNTER — Other Ambulatory Visit: Payer: Self-pay

## 2020-03-05 VITALS — BP 124/72 | HR 78 | Resp 12

## 2020-03-05 DIAGNOSIS — K219 Gastro-esophageal reflux disease without esophagitis: Secondary | ICD-10-CM | POA: Diagnosis not present

## 2020-03-05 DIAGNOSIS — R600 Localized edema: Secondary | ICD-10-CM | POA: Diagnosis not present

## 2020-03-05 DIAGNOSIS — L97519 Non-pressure chronic ulcer of other part of right foot with unspecified severity: Secondary | ICD-10-CM

## 2020-03-05 DIAGNOSIS — B353 Tinea pedis: Secondary | ICD-10-CM

## 2020-03-05 DIAGNOSIS — L97529 Non-pressure chronic ulcer of other part of left foot with unspecified severity: Secondary | ICD-10-CM

## 2020-03-05 MED ORDER — MUPIROCIN CALCIUM 2 % EX CREA: TOPICAL_CREAM | CUTANEOUS | 0 refills | Status: DC

## 2020-03-05 MED ORDER — TERBINAFINE HCL 1 % EX CREA: TOPICAL_CREAM | CUTANEOUS | 0 refills | Status: DC

## 2020-03-05 NOTE — Progress Notes (Signed)
Subjective:    Patient ID: Erika Day, female   DOB: 10-03-1930, 86 y.o.   MRN: [id number]   HPI   1.  Swelling of legs:  Worsened and now with blisters on feet. Not elevating feet again.  Encouraged reading labels of foods to maintain low sodium diet.  Erika Day often eats junk food.  Long discussion with daughter Erika Day and Erika Day about [facility] and her current home health aide, Erika Day and what the family describes as poor care.   We also discussed roach infestation and healthy home inspection if a new health aide gets things cleaned up, or family cleans up and aid maintains.  2.  Concern for her heart:  No shortness of breath.  Discussed her diagnoses.  3.  Feels a lump in throat and difficulties swallowing.  Drinks a lot of soda.  Erika Day, daughter, states Pantoprazole put in pillbox regularly.  She does skip the Panoprazole 2 times weekly.    Current Meds  Medication Sig  . cetirizine (ZYRTEC) 10 MG tablet 1 tab by mouth once daily with evening meal for allergies  . furosemide (LASIX) 40 MG tablet Take 1 tablet (40 mg total) by mouth daily.  Marland Kitchen lisinopril (ZESTRIL) 10 MG tablet Take 1 tablet (10 mg total) by mouth daily.  . pantoprazole (PROTONIX) 40 MG tablet 1 tab by mouth daily at 10 pm on empty stomach  . potassium chloride (KLOR-CON) 10 MEQ tablet TAKE 1 TABLET(10 MEQ) BY MOUTH DAILY   Allergies  Allergen Reactions  . Morphine And Related Other (See Comments)    Blood sugar dropped one-time     Review of Systems    Objective:   BP 124/72 (BP Location: Left Arm, Patient Position: Sitting, Cuff Size: Normal)   Pulse 78   Resp 12   Physical Exam  NAD LUngs:  CTA CV:  RRR Abd:  S, NT, No HSM or mass, + BS LE:  Right great toe and 2nd toe web moist with softening and white coloration, some fissuring.  Has superfical ulcers --3 mm in diameter on dorsal foot just proximal to web.  All toes swollen.  Left foot with small 3 mm ulcers at base of toes as well.  No  erythema . Assessment & Plan  1.  Foot ulcerations/tinea pedis/Leg edema:  Yeast vs.very mild bacterial infection.:  Mupirocin and Lamisil topically twice daily to affected areas. Breia needs to keep her legs up as much as possible.  Erika Day will work with her on this.  Notify [facility] to emphasize this again as well.  2. Concern for poor home health care:  Jemimah has been unwilling to look elsewhere, but her situation is not improving.  Discussed ways the family could improve cleaning of the home to help out the health aide, but we will look into new home health--need to speak with nursing from [facility] and another home health company about options.    3.  GERD:  To take her Pantoprazole daily and on empty stomach.  Erika Day will work with her on this.  Historically, her meds are missed frequently despite efforts at sitting down with aid, daughters and patient and setting up pillbox with list of meds and what they are for.  This has been performed multiple times only to find the pill bottles scattered over the home and not up to date and inability to find the pillbox.  Spent 1 hour with patient and daughter face to face with issues today.

## 2020-03-05 NOTE — Patient Instructions (Addendum)
Progress report tomorrow  Keep feet up at all times  Take Pantoprazole in the morning by itself 1 hour before breakfast and any other medication

## 2020-03-12 ENCOUNTER — Ambulatory Visit (INDEPENDENT_AMBULATORY_CARE_PROVIDER_SITE_OTHER): Payer: Medicare Other | Admitting: Internal Medicine

## 2020-03-12 ENCOUNTER — Encounter: Payer: Self-pay | Admitting: Internal Medicine

## 2020-03-12 ENCOUNTER — Other Ambulatory Visit: Payer: Self-pay

## 2020-03-12 VITALS — BP 122/72 | HR 78 | Resp 12

## 2020-03-12 DIAGNOSIS — B353 Tinea pedis: Secondary | ICD-10-CM | POA: Diagnosis not present

## 2020-03-12 DIAGNOSIS — K219 Gastro-esophageal reflux disease without esophagitis: Secondary | ICD-10-CM

## 2020-03-12 DIAGNOSIS — L97519 Non-pressure chronic ulcer of other part of right foot with unspecified severity: Secondary | ICD-10-CM

## 2020-03-12 DIAGNOSIS — R600 Localized edema: Secondary | ICD-10-CM

## 2020-03-12 DIAGNOSIS — L97529 Non-pressure chronic ulcer of other part of left foot with unspecified severity: Secondary | ICD-10-CM

## 2020-03-12 MED ORDER — MUPIROCIN CALCIUM 2 % EX CREA: TOPICAL_CREAM | CUTANEOUS | 0 refills | Status: DC

## 2020-03-12 NOTE — Progress Notes (Signed)
    Subjective:    Patient ID: Erika Day, female   DOB: 10-30-1930, 86 y.o.   MRN: [id number]   HPI   1. Home Health:  Have not heard back from [facility] or [facility] regarding care.  Her daughter, [name], is in te process of cleaning the house thoroughly.    2.  Foot sores:  Feels the pain in feet is better.  She is keeping her feet elevated through night and much of day.  Getting regular foot care from [facility].  They are applying the bactroban and Terbinafine twice daily to toes and in between.  Black dried skin has been removed with foot care as well.   3.  LE edema:  She is eating mobile meals, which she states is low sodium.  Describes a well rounded meal.  Has her pill box all set up.  [name] is following behind her to make sure she is taking her pills--checking her pillbox.  4.  GERD/swallowing issues:  Improved with daily use of Pantoprazole on empty stomach.  Current Meds  Medication Sig  . cetirizine (ZYRTEC) 10 MG tablet 1 tab by mouth once daily with evening meal for allergies  . furosemide (LASIX) 40 MG tablet Take 1 tablet (40 mg total) by mouth daily.  Marland Kitchen lisinopril (ZESTRIL) 10 MG tablet Take 1 tablet (10 mg total) by mouth daily.  . mupirocin cream (BACTROBAN) 2 % Apply to affected areas twice daily for 7 days.  . pantoprazole (PROTONIX) 40 MG tablet 1 tab by mouth daily at 10 pm on empty stomach  . potassium chloride (KLOR-CON) 10 MEQ tablet TAKE 1 TABLET(10 MEQ) BY MOUTH DAILY  . terbinafine (LAMISIL) 1 % cream Apply to affected areas twice daily for 14 days.   Allergies  Allergen Reactions  . Morphine And Related Other (See Comments)    Blood sugar dropped one-time     Review of Systems    Objective:   BP 122/72 (BP Location: Left Arm, Patient Position: Sitting, Cuff Size: Normal)   Pulse 78   Resp 12   Physical Exam  Lungs:  CTA CV:  RRR with SEM. LE:  Feet look much better.  Still with some swelling.  Interdigital areas clean and  dry. Assessment & Plan  1.  LE edema with foot ulcers:  Improved.  CPM  2.  Home health concerns:  Checking with [facility] and [facility] for home health--have not heard back  3.  GERD:  Improved symptoms with regular use of Pantoprazole.

## 2020-03-14 ENCOUNTER — Other Ambulatory Visit: Payer: Self-pay | Admitting: Internal Medicine

## 2020-03-26 ENCOUNTER — Other Ambulatory Visit: Payer: Self-pay

## 2020-03-26 ENCOUNTER — Inpatient Hospital Stay (HOSPITAL_COMMUNITY)
Admission: EM | Admit: 2020-03-26 | Discharge: 2020-03-31 | DRG: 389 | Disposition: A | Discharge status: Home Health | Payer: Medicare Other | Attending: Internal Medicine | Admitting: Internal Medicine

## 2020-03-26 ENCOUNTER — Emergency Department (HOSPITAL_COMMUNITY): Payer: Medicare Other

## 2020-03-26 ENCOUNTER — Encounter (HOSPITAL_COMMUNITY): Payer: Self-pay

## 2020-03-26 DIAGNOSIS — I130 Hypertensive heart and chronic kidney disease with heart failure and stage 1 through stage 4 chronic kidney disease, or unspecified chronic kidney disease: Secondary | ICD-10-CM | POA: Diagnosis present

## 2020-03-26 DIAGNOSIS — M5136 Other intervertebral disc degeneration, lumbar region: Secondary | ICD-10-CM | POA: Diagnosis present

## 2020-03-26 DIAGNOSIS — I5032 Chronic diastolic (congestive) heart failure: Secondary | ICD-10-CM | POA: Diagnosis present

## 2020-03-26 DIAGNOSIS — E1165 Type 2 diabetes mellitus with hyperglycemia: Secondary | ICD-10-CM

## 2020-03-26 DIAGNOSIS — L89892 Pressure ulcer of other site, stage 2: Secondary | ICD-10-CM | POA: Diagnosis present

## 2020-03-26 DIAGNOSIS — K5900 Constipation, unspecified: Secondary | ICD-10-CM

## 2020-03-26 DIAGNOSIS — L8990 Pressure ulcer of unspecified site, unspecified stage: Secondary | ICD-10-CM | POA: Insufficient documentation

## 2020-03-26 DIAGNOSIS — K5641 Fecal impaction: Principal | ICD-10-CM | POA: Diagnosis present

## 2020-03-26 DIAGNOSIS — R079 Chest pain, unspecified: Secondary | ICD-10-CM

## 2020-03-26 DIAGNOSIS — D472 Monoclonal gammopathy: Secondary | ICD-10-CM | POA: Diagnosis present

## 2020-03-26 DIAGNOSIS — N1832 Chronic kidney disease, stage 3b: Secondary | ICD-10-CM | POA: Diagnosis present

## 2020-03-26 DIAGNOSIS — E440 Moderate protein-calorie malnutrition: Secondary | ICD-10-CM

## 2020-03-26 DIAGNOSIS — C8584 Other specified types of non-Hodgkin lymphoma, lymph nodes of axilla and upper limb: Secondary | ICD-10-CM | POA: Diagnosis present

## 2020-03-26 DIAGNOSIS — C8590 Non-Hodgkin lymphoma, unspecified, unspecified site: Secondary | ICD-10-CM | POA: Diagnosis present

## 2020-03-26 DIAGNOSIS — Z8673 Personal history of transient ischemic attack (TIA), and cerebral infarction without residual deficits: Secondary | ICD-10-CM

## 2020-03-26 DIAGNOSIS — E1122 Type 2 diabetes mellitus with diabetic chronic kidney disease: Secondary | ICD-10-CM | POA: Diagnosis present

## 2020-03-26 DIAGNOSIS — Z20822 Contact with and (suspected) exposure to covid-19: Secondary | ICD-10-CM | POA: Diagnosis present

## 2020-03-26 DIAGNOSIS — R1010 Upper abdominal pain, unspecified: Secondary | ICD-10-CM

## 2020-03-26 DIAGNOSIS — I10 Essential (primary) hypertension: Secondary | ICD-10-CM | POA: Diagnosis present

## 2020-03-26 DIAGNOSIS — R7989 Other specified abnormal findings of blood chemistry: Secondary | ICD-10-CM | POA: Diagnosis present

## 2020-03-26 DIAGNOSIS — Z885 Allergy status to narcotic agent status: Secondary | ICD-10-CM

## 2020-03-26 DIAGNOSIS — Z79899 Other long term (current) drug therapy: Secondary | ICD-10-CM

## 2020-03-26 DIAGNOSIS — Z9049 Acquired absence of other specified parts of digestive tract: Secondary | ICD-10-CM

## 2020-03-26 DIAGNOSIS — Z8249 Family history of ischemic heart disease and other diseases of the circulatory system: Secondary | ICD-10-CM

## 2020-03-26 DIAGNOSIS — K219 Gastro-esophageal reflux disease without esophagitis: Secondary | ICD-10-CM | POA: Diagnosis present

## 2020-03-26 DIAGNOSIS — R140 Abdominal distension (gaseous): Secondary | ICD-10-CM

## 2020-03-26 DIAGNOSIS — R109 Unspecified abdominal pain: Secondary | ICD-10-CM

## 2020-03-26 DIAGNOSIS — Z856 Personal history of leukemia: Secondary | ICD-10-CM

## 2020-03-26 DIAGNOSIS — Z87891 Personal history of nicotine dependence: Secondary | ICD-10-CM

## 2020-03-26 DIAGNOSIS — D61818 Other pancytopenia: Secondary | ICD-10-CM | POA: Diagnosis present

## 2020-03-26 DIAGNOSIS — E785 Hyperlipidemia, unspecified: Secondary | ICD-10-CM | POA: Diagnosis present

## 2020-03-26 DIAGNOSIS — D696 Thrombocytopenia, unspecified: Secondary | ICD-10-CM

## 2020-03-26 HISTORY — DX: Volvulus: K56.2

## 2020-03-26 LAB — CBC WITH DIFFERENTIAL/PLATELET
Abs Immature Granulocytes: 0 10*3/uL (ref 0.00–0.07)
Basophils Absolute: 0 10*3/uL (ref 0.0–0.1)
Basophils Relative: 0 %
Eosinophils Absolute: 0.1 10*3/uL (ref 0.0–0.5)
Eosinophils Relative: 5 %
HCT: 31.7 % — ABNORMAL LOW (ref 36.0–46.0)
Hemoglobin: 9.9 g/dL — ABNORMAL LOW (ref 12.0–15.0)
Lymphocytes Relative: 42 %
Lymphs Abs: 1.1 10*3/uL (ref 0.7–4.0)
MCH: 28.7 pg (ref 26.0–34.0)
MCHC: 31.2 g/dL (ref 30.0–36.0)
MCV: 91.9 fL (ref 80.0–100.0)
Monocytes Absolute: 0.2 10*3/uL (ref 0.1–1.0)
Monocytes Relative: 7 %
Neutro Abs: 1.2 10*3/uL — ABNORMAL LOW (ref 1.7–7.7)
Neutrophils Relative %: 46 %
Platelets: 132 10*3/uL — ABNORMAL LOW (ref 150–400)
RBC: 3.45 MIL/uL — ABNORMAL LOW (ref 3.87–5.11)
RDW: 12.6 % (ref 11.5–15.5)
WBC: 2.6 10*3/uL — ABNORMAL LOW (ref 4.0–10.5)
nRBC: 0 % (ref 0.0–0.2)
nRBC: 0 /100 WBC

## 2020-03-26 LAB — COMPREHENSIVE METABOLIC PANEL
ALT: 13 U/L (ref 0–44)
AST: 19 U/L (ref 15–41)
Albumin: 3.7 g/dL (ref 3.5–5.0)
Alkaline Phosphatase: 48 U/L (ref 38–126)
Anion gap: 12 (ref 5–15)
BUN: 32 mg/dL — ABNORMAL HIGH (ref 8–23)
CO2: 24 mmol/L (ref 22–32)
Calcium: 9.9 mg/dL (ref 8.9–10.3)
Chloride: 102 mmol/L (ref 98–111)
Creatinine, Ser: 1.55 mg/dL — ABNORMAL HIGH (ref 0.44–1.00)
GFR calc Af Amer: 34 mL/min — ABNORMAL LOW (ref >60)
GFR calc non Af Amer: 30 mL/min — ABNORMAL LOW (ref >60)
Glucose, Bld: 150 mg/dL — ABNORMAL HIGH (ref 70–99)
Potassium: 4.6 mmol/L (ref 3.5–5.1)
Sodium: 138 mmol/L (ref 135–145)
Total Bilirubin: 0.2 mg/dL — ABNORMAL LOW (ref 0.3–1.2)
Total Protein: 6.7 g/dL (ref 6.5–8.1)

## 2020-03-26 LAB — D-DIMER, QUANTITATIVE: D-Dimer, Quant: >20 ug/mL-FEU — ABNORMAL HIGH (ref 0.00–0.50)

## 2020-03-26 LAB — URINALYSIS, ROUTINE W REFLEX MICROSCOPIC
Bilirubin Urine: NEGATIVE
Glucose, UA: NEGATIVE mg/dL
Hgb urine dipstick: NEGATIVE
Ketones, ur: NEGATIVE mg/dL
Leukocytes,Ua: NEGATIVE
Nitrite: NEGATIVE
Protein, ur: NEGATIVE mg/dL
Specific Gravity, Urine: 1.009 (ref 1.005–1.030)
pH: 8 (ref 5.0–8.0)

## 2020-03-26 LAB — BRAIN NATRIURETIC PEPTIDE: B Natriuretic Peptide: 202.7 pg/mL — ABNORMAL HIGH (ref 0.0–100.0)

## 2020-03-26 LAB — TROPONIN I (HIGH SENSITIVITY)
Troponin I (High Sensitivity): 12 ng/L (ref <18)
Troponin I (High Sensitivity): 12 ng/L (ref <18)

## 2020-03-26 MED ORDER — IOHEXOL 350 MG/ML SOLN
80.0000 mL | Freq: Once | INTRAVENOUS | Status: AC | PRN
  Administered 2020-03-26: 80 mL via INTRAVENOUS

## 2020-03-26 NOTE — ED Notes (Signed)
Patient transported to CT scan . 

## 2020-03-26 NOTE — ED Triage Notes (Signed)
Pt bib ems, reporting that the pt has shortness of breath. Pt has hxt of chf with some leg swelling. Pt is aox4 and satting 98% on ra.   168/75 p80 24rr

## 2020-03-26 NOTE — ED Provider Notes (Signed)
[facility] EMERGENCY DEPARTMENT Provider Note   CSN: [id number] Arrival date & time: 03/26/20  1701     History Chief Complaint  Patient presents with  . Shortness of Breath    Erika Day is a 86 y.o. female.  HPI Patient presents with chest pain and feeling bad.  Had reported shortness of breath.  States she was taking a nap and woke up to some pain in her mid chest.  States she felt bad with it.  States she was not necessarily that short of breath with it.  States she got up in her family member so that she looked bad and needed to call an ambulance.  States she has had pains like this before and sometimes she will burp and it will feel better.  Her legs are swollen but not as much as sometimes she says.  No fevers.  No cough.  No nausea or vomiting.  Patient states she uses a wheelchair to get around    Past Medical History:  Diagnosis Date  . Acid reflux disease   . Cancer ([place])   . CLL (chronic lymphocytic leukemia) ([place]) 05/15/2016  . DDD (degenerative disc disease), lumbar   . Degenerative joint disease   . Diabetes mellitus without complication [facility])    Patient states this was a misdiagnosis from years ago  . Dyslipidemia   . History of diabetes mellitus 04/19/2017  . Hypertension   . 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 Panic attacks   . Pneumonia 02/2016  . 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 Stroke [facility]) age 14 yo   poorly controlled DM and Hypertension    Patient Active Problem List   Diagnosis Date Noted  . Goals of care, counseling/discussion 01/23/2020  . Recurrent falls 09/05/2019  . Acute on chronic congestive heart failure ([place]) 06/29/2019  . Acute CHF (congestive heart failure) ([place]) 06/29/2019  . CHF (congestive heart failure) ([place]) 06/28/2019  . Small bowel obstruction ([place]) 04/08/2018  . MGUS (monoclonal gammopathy of unknown significance) 02/11/2018  . History of diabetes mellitus  04/19/2017  . Hypercalcemia 02/12/2017  . Chronic leukopenia 02/12/2017  . Postoperative anemia due to acute blood loss 11/26/2016  . Fall 11/23/2016  . Closed fracture of intertrochanteric section of femur ([place]) 11/23/2016  . Other pancytopenia ([place]) 11/13/2016  . Sinusitis 10/14/2016  . Bleeding external hemorrhoids 08/20/2016  . Recurrent ventral incisional hernia s/p primary repair 06/30/2016 07/01/2016  . Pressure ulcer 05/20/2016  . Lower extremity edema 05/20/2016  . Protein-calorie malnutrition, severe ([place]) 05/20/2016  . Volvulus of sigmoid colon ([place])   . Sigmoid volvulus s/p lap sigmoid colectomy 06/30/2016 05/19/2016  . 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 Other fatigue 05/15/2016  . CAP (community acquired pneumonia) 02/13/2016  . Anemia, chronic disease 02/13/2016  . Thrombocytopenia ([place]) 02/13/2016  . Hyponatremia 02/13/2016  . Kyphosis 02/01/2016  . Pressure ulcer of coccygeal region 01/31/2016  . Mobility impaired 01/31/2016  . Osteoarthritis 01/31/2016  . Abdominal cramping 04/23/2014  . Essential hypertension 09/06/2011  . Ovarian torsion 08/25/2011    Past Surgical History:  Procedure Laterality Date  . ABDOMINAL HYSTERECTOMY  ? 08/25/2011 ?   Has BSO for left benign ovarian tumor with torsion and broad ligament benign tumor, but does not appear had hysterectomy--[facility], NOT ovarian cancer  . CHOLECYSTECTOMY    . FEMUR IM NAIL  Left 11/23/2016   Procedure: INTRAMEDULLARY (IM) NAIL FEMORAL;  Surgeon: Gaynelle Arabian, MD;  Location: [facility];  Service: Orthopedics;  Laterality: Left;  . FLEXIBLE SIGMOIDOSCOPY N/A 05/19/2016   Procedure: FLEXIBLE SIGMOIDOSCOPY;  Surgeon: [name], MD;  Location: [facility];  Service: Endoscopy;  Laterality: N/A;  . FLEXIBLE SIGMOIDOSCOPY N/A 06/28/2016   Procedure: FLEXIBLE SIGMOIDOSCOPY;  Surgeon: Ladene Artist, MD;  Location: [facility];   Service: Endoscopy;  Laterality: N/A;  . LAPAROSCOPIC SIGMOID COLECTOMY N/A 06/30/2016   Procedure: LAPAROSCOPIC SIGMOID COLECTOMY,rigid sigmoidoscopy, repair recurrent incisional hernia;  Surgeon: Michael Boston, MD;  Location: [facility];  Service: General;  Laterality: N/A;  . LAPAROTOMY N/A 04/29/2014   Procedure: EXPLORATORY LAPAROTOMY;  Surgeon: Imogene Burn. Georgette Dover, MD;  Location: [facility];  Service: General;  Laterality: N/A;  . LAPAROTOMY N/A 04/16/2018   Procedure: EXPLORATORY LAPAROTOMY WITH LYSIS OF ADHESIONS;  Surgeon: Armandina Gemma, MD;  Location: [facility];  Service: General;  Laterality: N/A;  . LYSIS OF ADHESION N/A 04/29/2014   Procedure: EXTENSIVE LYSIS OF ADHESIONS;  Surgeon: Imogene Burn. Georgette Dover, MD;  Location: [facility];  Service: General;  Laterality: N/A;  . VENTRAL HERNIA REPAIR N/A 04/29/2014   Procedure: PRIMARY REPAIR OF VENTRAL HERNIA;  Surgeon: Imogene Burn. Georgette Dover, MD;  Location: [facility] OR;  Service: General;  Laterality: N/A;     OB History   No obstetric history on file.     Family History  Problem Relation Age of Onset  . Hypertension Mother   . Stroke Mother   . Arthritis Mother   . Alcohol abuse Father     Social History   Tobacco Use  . Smoking status: Former Smoker    Types: Cigarettes    Quit date: 02/18/1990    Years since quitting: 30.1  . Smokeless tobacco: Former Systems developer    Types: Snuff    Quit date: 02/18/1990  Substance Use Topics  . Alcohol use: No    Alcohol/week: 0.0 standard drinks    Comment: Only drank alcohol between ages of 31 and 30 yo.  Her father gave it to her  . Drug use: No    Home Medications Prior to Admission medications   Medication Sig Start Date End Date Taking? Authorizing Provider  cetirizine (ZYRTEC) 10 MG tablet 1 tab by mouth once daily with evening meal for allergies 10/06/19   Mack Hook, MD  furosemide (LASIX) 40 MG tablet Take 1 tablet (40 mg total) by mouth daily. 10/06/19 04/03/20  Mack Hook, MD  lisinopril (ZESTRIL) 10 MG  tablet Take 1 tablet (10 mg total) by mouth daily. 10/06/19 04/03/20  Mack Hook, MD  mupirocin cream (BACTROBAN) 2 % Apply to affected areas twice daily for 7 days. 03/12/20   Mack Hook, MD  pantoprazole (PROTONIX) 40 MG tablet 1 tab by mouth daily at 10 pm on empty stomach 10/06/19   Mack Hook, MD  potassium chloride (KLOR-CON) 10 MEQ tablet TAKE 1 TABLET(10 MEQ) BY MOUTH DAILY 03/15/20   Mack Hook, MD  terbinafine (LAMISIL) 1 % cream Apply to affected areas twice daily for 14 days. 03/05/20   Mack Hook, MD    Allergies    Morphine and related  Review of Systems   Review of Systems  Constitutional: Positive for fatigue. Negative for fever.  HENT: Negative for congestion.   Respiratory: Positive for shortness of breath.   Cardiovascular: Positive for chest pain.  Gastrointestinal: Positive for abdominal pain.  Genitourinary: Negative for flank pain.  Musculoskeletal: Negative for  back pain.  Neurological: Positive for weakness.  Psychiatric/Behavioral: Negative for confusion.    Physical Exam Updated Vital Signs BP (!) 146/72 (BP Location: Right Arm)   Pulse 84   Temp 97.6 F (36.4 C) (Oral)   Resp 16   Wt 70.8 kg   SpO2 98%   BMI 28.53 kg/m   Physical Exam Vitals and nursing note reviewed.  Cardiovascular:     Rate and Rhythm: Regular rhythm.     Heart sounds: Murmur present.  Pulmonary:     Breath sounds: No wheezing, rhonchi or rales.  Chest:     Chest wall: Tenderness present.     Comments: Mild anterior chest tenderness.  No deformity.  No rash. Abdominal:     Comments: Mild epigastric tenderness without rebound guarding or mass.  Musculoskeletal:     Comments: Mild edema bilateral lower extremities.  Skin:    General: Skin is warm.     Capillary Refill: Capillary refill takes less than 2 seconds.  Neurological:     Mental Status: She is alert.     Comments: Patient is awake and pleasant.  Psychiatric:        Mood  and Affect: Mood normal.     ED Results / Procedures / Treatments   Labs (all labs ordered are listed, but only abnormal results are displayed) Labs Reviewed  BRAIN NATRIURETIC PEPTIDE - Abnormal; Notable for the following components:      Result Value   B Natriuretic Peptide 202.7 (*)    All other components within normal limits  COMPREHENSIVE METABOLIC PANEL - Abnormal; Notable for the following components:   Glucose, Bld 150 (*)    BUN 32 (*)    Creatinine, Ser 1.55 (*)    Total Bilirubin 0.2 (*)    GFR calc non Af Amer 30 (*)    GFR calc Af Amer 34 (*)    All other components within normal limits  CBC WITH DIFFERENTIAL/PLATELET - Abnormal; Notable for the following components:   WBC 2.6 (*)    RBC 3.45 (*)    Hemoglobin 9.9 (*)    HCT 31.7 (*)    Platelets 132 (*)    Neutro Abs 1.2 (*)    All other components within normal limits  D-DIMER, QUANTITATIVE (NOT AT [facility]) - Abnormal; Notable for the following components:   D-Dimer, Quant >20.00 (*)    All other components within normal limits  SARS CORONAVIRUS 2 BY RT PCR (HOSPITAL ORDER, [facility])  URINALYSIS, ROUTINE W REFLEX MICROSCOPIC  TROPONIN I (HIGH SENSITIVITY)  TROPONIN I (HIGH SENSITIVITY)    EKG EKG Interpretation  Date/Time:  Tuesday Mar 26 2020 17:18:51 EDT Ventricular Rate:  63 PR Interval:    QRS Duration: 113 QT Interval:  407 QTC Calculation: 417 R Axis:   -43 Text Interpretation: Sinus rhythm Short PR interval Borderline IVCD with LAD Probable anteroseptal infarct, recent Artifact in lead(s) I III aVR aVL aVF V1 V2 No significant change since last tracing Confirmed by Davonna Belling [phone]) on 03/26/2020 5:21:13 PM   Radiology CT Angio Chest PE W and/or Wo Contrast  Result Date: 03/26/2020 CLINICAL DATA:  Shortness of breath. EXAM: CT ANGIOGRAPHY CHEST WITH CONTRAST TECHNIQUE: Multidetector CT imaging of the chest was performed using the standard protocol during  bolus administration of intravenous contrast. Multiplanar CT image reconstructions and MIPs were obtained to evaluate the vascular anatomy. CONTRAST:  56mL OMNIPAQUE IOHEXOL 350 MG/ML SOLN COMPARISON:  February 13, 2016 FINDINGS:  Cardiovascular: There is marked severity calcification of the thoracic aorta. Satisfactory opacification of the pulmonary arteries to the segmental level. No evidence of pulmonary embolism. Normal heart size. No pericardial effusion. Mediastinum/Nodes: No enlarged mediastinal, hilar, or axillary lymph nodes. Thyroid gland, trachea, and esophagus demonstrate no significant findings. Lungs/Pleura: Mild to moderate severity right basilar atelectasis is seen with mild atelectasis noted within the left lung base. There is marked severity elevation of the right hemidiaphragm. This is seen on the prior study. There is no evidence of a pleural effusion or pneumothorax. Upper Abdomen: There is a small hiatal hernia. Multiple surgical clips are seen within the gallbladder fossa. Musculoskeletal: Multilevel degenerative changes are seen throughout the thoracic spine. Review of the MIP images confirms the above findings. IMPRESSION: 1. No evidence of pulmonary embolism. 2. Mild to moderate severity right basilar atelectasis with mild left basilar atelectasis. 3. Marked severity elevation of the right hemidiaphragm. 4. Small hiatal hernia. Aortic Atherosclerosis (ICD10-I70.0). Electronically Signed   By: Virgina Norfolk M.D.   On: 03/26/2020 23:09   CT ABDOMEN PELVIS W CONTRAST  Result Date: 03/26/2020 CLINICAL DATA:  Shortness of breath. EXAM: CT ABDOMEN AND PELVIS WITH CONTRAST TECHNIQUE: Multidetector CT imaging of the abdomen and pelvis was performed using the standard protocol following bolus administration of intravenous contrast. CONTRAST:  2mL OMNIPAQUE IOHEXOL 350 MG/ML SOLN COMPARISON:  April 15, 2018 FINDINGS: Lower chest: Mild to moderate severity atelectasis is seen within the bilateral  lung bases, right greater than left. There is stable marked severity elevation of the right hemidiaphragm. Hepatobiliary: No focal liver abnormality is seen. Status post cholecystectomy. No biliary dilatation. Pancreas: Unremarkable. No pancreatic ductal dilatation or surrounding inflammatory changes. Spleen: Normal in size without focal abnormality. Adrenals/Urinary Tract: The right adrenal gland is not clearly identified. The left adrenal gland is unremarkable. The kidneys are mildly decreased in size, without renal calculi, focal lesions, or hydronephrosis. Bladder is unremarkable. Stomach/Bowel: Stomach is within normal limits. The appendix is not clearly identified. Surgically anastomosed bowel is seen within the expected region of the mid sigmoid colon. A very large amount of stool is seen within a distended distal sigmoid colon and rectum. The remaining loops of large and small bowel are normal in caliber. Vascular/Lymphatic: There is marked severity calcification of the abdominal aorta. No enlarged abdominal or pelvic lymph nodes. Reproductive: The uterus is small in size and contains several subcentimeter coarse parenchymal calcifications. The bilateral adnexa are unremarkable. Other: No abdominal wall hernia or abnormality. No abdominopelvic ascites. Musculoskeletal: A metallic density intramedullary rod and compression screw device are seen within the proximal left femur. Multilevel marked severity degenerative changes are noted throughout the lumbar spine. IMPRESSION: 1. Very large amount of stool within a distended distal sigmoid colon and rectum. 2. Mild to moderate severity atelectasis within the bilateral lung bases, right greater than left. 3. Stable marked severity elevation of the right hemidiaphragm. 4. Evidence of prior cholecystectomy. 5. Prior open reduction internal fixation of the proximal left femur. Aortic Atherosclerosis (ICD10-I70.0). Electronically Signed   By: Virgina Norfolk M.D.    On: 03/26/2020 23:14   DG Chest Portable 1 View  Result Date: 03/26/2020 CLINICAL DATA:  Weakness, short of breath, lower extremity edema EXAM: PORTABLE CHEST 1 VIEW COMPARISON:  06/28/2019 FINDINGS: Single frontal view of the chest demonstrates a stable cardiac silhouette. There is chronic elevation of the right hemidiaphragm, with new right basilar consolidation. Left chest is clear. No pneumothorax. IMPRESSION: 1. Right basilar consolidation, which could reflect atelectasis  or airspace disease. 2. Chronic elevation right hemidiaphragm. Electronically Signed   By: Randa Ngo M.D.   On: 03/26/2020 17:45   DG Abd 2 Views  Result Date: 03/26/2020 CLINICAL DATA:  Abdominal pain, lower extremity edema EXAM: ABDOMEN - 2 VIEW COMPARISON:  04/15/2018 FINDINGS: 2 supine frontal views of the abdomen and pelvis demonstrate an unremarkable bowel gas pattern. Significant retained stool within the rectal vault consistent with fecal impaction. There are no masses or abnormal calcifications. There is chronic elevation of the right hemidiaphragm. No masses or abnormal calcifications. IMPRESSION: 1. Significant retained stool within the rectal vault which could reflect fecal impaction. 2. No evidence of bowel obstruction or ileus. Electronically Signed   By: Randa Ngo M.D.   On: 03/26/2020 21:15    Procedures Procedures (including critical care time)  Medications Ordered in ED Medications  iohexol (OMNIPAQUE) 350 MG/ML injection 80 mL (80 mLs Intravenous Contrast Given 03/26/20 2239)    ED Course  I have reviewed the triage vital signs and the nursing notes.  Pertinent labs & imaging results that were available during my care of the patient were reviewed by me and considered in my medical decision making (see chart for details).    MDM Rules/Calculators/A&P                      Patient presented with epigastric pain chest pain and ill-appearing.  Reportedly had some shortness of breath.  Has some  swelling in her legs, particularly left leg.  Troponins negative.  X-ray showed atelectasis versus infiltrate.  Further discussion with family got D-dimer which was severely elevated.  CT scan done of chest and abdomen.  No PE.  Abdomen also reassuring but does have large amount of stool and rectum and sigmoid.  With comorbidities age chest pain and the fact she looked ill feels the patient would benefit overnight monitoring and potentially bowel protocol to help clear out stool.  Will discuss with hospitalist. Final Clinical Impression(s) / ED Diagnoses Final diagnoses:  Abdominal pain  Chest pain, unspecified type  Pain of upper abdomen  Constipation, unspecified constipation type    Rx / DC Orders ED Discharge Orders    None       Davonna Belling, MD 03/26/20 2329

## 2020-03-27 ENCOUNTER — Encounter (HOSPITAL_COMMUNITY): Payer: Self-pay | Admitting: Internal Medicine

## 2020-03-27 ENCOUNTER — Observation Stay (HOSPITAL_BASED_OUTPATIENT_CLINIC_OR_DEPARTMENT_OTHER): Payer: Medicare Other

## 2020-03-27 DIAGNOSIS — D61818 Other pancytopenia: Secondary | ICD-10-CM

## 2020-03-27 DIAGNOSIS — R109 Unspecified abdominal pain: Secondary | ICD-10-CM

## 2020-03-27 DIAGNOSIS — I5031 Acute diastolic (congestive) heart failure: Secondary | ICD-10-CM | POA: Diagnosis not present

## 2020-03-27 DIAGNOSIS — M7989 Other specified soft tissue disorders: Secondary | ICD-10-CM | POA: Diagnosis not present

## 2020-03-27 DIAGNOSIS — K5909 Other constipation: Secondary | ICD-10-CM | POA: Diagnosis not present

## 2020-03-27 DIAGNOSIS — I10 Essential (primary) hypertension: Secondary | ICD-10-CM

## 2020-03-27 DIAGNOSIS — R7989 Other specified abnormal findings of blood chemistry: Secondary | ICD-10-CM | POA: Diagnosis not present

## 2020-03-27 DIAGNOSIS — L8990 Pressure ulcer of unspecified site, unspecified stage: Secondary | ICD-10-CM | POA: Insufficient documentation

## 2020-03-27 DIAGNOSIS — E1165 Type 2 diabetes mellitus with hyperglycemia: Secondary | ICD-10-CM

## 2020-03-27 DIAGNOSIS — N1832 Chronic kidney disease, stage 3b: Secondary | ICD-10-CM | POA: Diagnosis not present

## 2020-03-27 LAB — COMPREHENSIVE METABOLIC PANEL
ALT: 12 U/L (ref 0–44)
AST: 19 U/L (ref 15–41)
Albumin: 3.4 g/dL — ABNORMAL LOW (ref 3.5–5.0)
Alkaline Phosphatase: 53 U/L (ref 38–126)
Anion gap: 8 (ref 5–15)
BUN: 27 mg/dL — ABNORMAL HIGH (ref 8–23)
CO2: 25 mmol/L (ref 22–32)
Calcium: 9.9 mg/dL (ref 8.9–10.3)
Chloride: 104 mmol/L (ref 98–111)
Creatinine, Ser: 1.45 mg/dL — ABNORMAL HIGH (ref 0.44–1.00)
GFR calc Af Amer: 37 mL/min — ABNORMAL LOW (ref >60)
GFR calc non Af Amer: 32 mL/min — ABNORMAL LOW (ref >60)
Glucose, Bld: 118 mg/dL — ABNORMAL HIGH (ref 70–99)
Potassium: 4.7 mmol/L (ref 3.5–5.1)
Sodium: 137 mmol/L (ref 135–145)
Total Bilirubin: 0.7 mg/dL (ref 0.3–1.2)
Total Protein: 6.6 g/dL (ref 6.5–8.1)

## 2020-03-27 LAB — CBC WITH DIFFERENTIAL/PLATELET
Abs Immature Granulocytes: 0.01 10*3/uL (ref 0.00–0.07)
Basophils Absolute: 0 10*3/uL (ref 0.0–0.1)
Basophils Relative: 0 %
Eosinophils Absolute: 0.1 10*3/uL (ref 0.0–0.5)
Eosinophils Relative: 3 %
HCT: 32.8 % — ABNORMAL LOW (ref 36.0–46.0)
Hemoglobin: 10.4 g/dL — ABNORMAL LOW (ref 12.0–15.0)
Immature Granulocytes: 0 %
Lymphocytes Relative: 37 %
Lymphs Abs: 0.9 10*3/uL (ref 0.7–4.0)
MCH: 28.8 pg (ref 26.0–34.0)
MCHC: 31.7 g/dL (ref 30.0–36.0)
MCV: 90.9 fL (ref 80.0–100.0)
Monocytes Absolute: 0.2 10*3/uL (ref 0.1–1.0)
Monocytes Relative: 8 %
Neutro Abs: 1.3 10*3/uL — ABNORMAL LOW (ref 1.7–7.7)
Neutrophils Relative %: 52 %
Platelets: 112 10*3/uL — ABNORMAL LOW (ref 150–400)
RBC: 3.61 MIL/uL — ABNORMAL LOW (ref 3.87–5.11)
RDW: 12.5 % (ref 11.5–15.5)
WBC: 2.5 10*3/uL — ABNORMAL LOW (ref 4.0–10.5)
nRBC: 0 % (ref 0.0–0.2)

## 2020-03-27 LAB — TSH: TSH: 2.155 u[IU]/mL (ref 0.350–4.500)

## 2020-03-27 LAB — SARS CORONAVIRUS 2 BY RT PCR (HOSPITAL ORDER, PERFORMED IN ~~LOC~~ HOSPITAL LAB): SARS Coronavirus 2: NEGATIVE

## 2020-03-27 LAB — MAGNESIUM: Magnesium: 1.9 mg/dL (ref 1.7–2.4)

## 2020-03-27 LAB — ECHOCARDIOGRAM COMPLETE: Weight: 2496 oz

## 2020-03-27 LAB — LIPASE, BLOOD: Lipase: 47 U/L (ref 11–51)

## 2020-03-27 LAB — TROPONIN I (HIGH SENSITIVITY): Troponin I (High Sensitivity): 15 ng/L (ref <18)

## 2020-03-27 MED ORDER — PANTOPRAZOLE SODIUM 40 MG PO TBEC
40.0000 mg | DELAYED_RELEASE_TABLET | Freq: Every day | ORAL | Status: DC
  Administered 2020-03-27 – 2020-03-31 (×5): 40 mg via ORAL
  Filled 2020-03-27 (×5): qty 1

## 2020-03-27 MED ORDER — SENNA 8.6 MG PO TABS: 2.0000 | ORAL_TABLET | Freq: Every day | ORAL | Status: DC

## 2020-03-27 MED ORDER — FLEET ENEMA 7-19 GM/118ML RE ENEM
1.0000 | ENEMA | Freq: Once | RECTAL | Status: AC
  Administered 2020-03-27: 1 via RECTAL
  Filled 2020-03-27: qty 1

## 2020-03-27 MED ORDER — LACTATED RINGERS IV SOLN: INTRAVENOUS | Status: DC

## 2020-03-27 MED ORDER — POLYETHYLENE GLYCOL 3350 17 G PO PACK
17.0000 g | PACK | Freq: Two times a day (BID) | ORAL | Status: DC
  Administered 2020-03-27 – 2020-03-31 (×9): 17 g via ORAL
  Filled 2020-03-27 (×9): qty 1

## 2020-03-27 MED ORDER — ENOXAPARIN SODIUM 30 MG/0.3ML ~~LOC~~ SOLN
30.0000 mg | SUBCUTANEOUS | Status: DC
  Administered 2020-03-27 – 2020-03-31 (×5): 30 mg via SUBCUTANEOUS
  Filled 2020-03-27 (×5): qty 0.3

## 2020-03-27 MED ORDER — SENNOSIDES-DOCUSATE SODIUM 8.6-50 MG PO TABS
1.0000 | ORAL_TABLET | Freq: Two times a day (BID) | ORAL | Status: DC
  Administered 2020-03-27 – 2020-03-31 (×9): 1 via ORAL
  Filled 2020-03-27 (×9): qty 1

## 2020-03-27 MED ORDER — ONDANSETRON HCL 4 MG PO TABS: 4.0000 mg | ORAL_TABLET | Freq: Four times a day (QID) | ORAL | Status: DC | PRN

## 2020-03-27 MED ORDER — MAGNESIUM CITRATE PO SOLN
1.0000 | Freq: Once | ORAL | Status: AC | PRN
  Administered 2020-03-28: 1 via ORAL
  Filled 2020-03-27: qty 296

## 2020-03-27 MED ORDER — LISINOPRIL 10 MG PO TABS
10.0000 mg | ORAL_TABLET | Freq: Every day | ORAL | Status: DC
  Administered 2020-03-27 – 2020-03-31 (×5): 10 mg via ORAL
  Filled 2020-03-27 (×5): qty 1

## 2020-03-27 MED ORDER — POLYETHYLENE GLYCOL 3350 17 G PO PACK
17.0000 g | PACK | Freq: Every day | ORAL | Status: DC
  Administered 2020-03-27: 17 g via ORAL
  Filled 2020-03-27: qty 1

## 2020-03-27 MED ORDER — ACETAMINOPHEN 650 MG RE SUPP: 650.0000 mg | Freq: Four times a day (QID) | RECTAL | Status: DC | PRN

## 2020-03-27 MED ORDER — ACETAMINOPHEN 325 MG PO TABS
650.0000 mg | ORAL_TABLET | Freq: Four times a day (QID) | ORAL | Status: DC | PRN
  Administered 2020-03-27 – 2020-03-29 (×4): 650 mg via ORAL
  Filled 2020-03-27 (×5): qty 2

## 2020-03-27 MED ORDER — LORATADINE 10 MG PO TABS
10.0000 mg | ORAL_TABLET | Freq: Every day | ORAL | Status: DC
  Administered 2020-03-27 – 2020-03-31 (×5): 10 mg via ORAL
  Filled 2020-03-27 (×5): qty 1

## 2020-03-27 MED ORDER — ONDANSETRON HCL 4 MG/2ML IJ SOLN: 4.0000 mg | Freq: Four times a day (QID) | INTRAMUSCULAR | Status: DC | PRN

## 2020-03-27 MED ORDER — LACTATED RINGERS IV SOLN: INTRAVENOUS | Status: AC

## 2020-03-27 NOTE — Progress Notes (Signed)
Patient admitted this morning for obstipation.  Started on Fleet enema and bowel regimen.  Per nursing staff patient had one bowel movement right before my evaluation this morning.  When I saw the patient he appeared comfortably sitting on the bed reporting of mild abdominal pain but greatly improved from the time of admission.  Vital signs remained stable.  On physical exam she is elderly frail with bilateral temporal wasting otherwise abdominal exam is nontender nondistended with positive bowel sounds.  CTA abdomen pelvis reviewed no evidence of PE.  At this time we will pursue aggressive bowel regimen, enema. Gentle hydration, full liquid diet. Hold off on home Lasix.  Patient's daughter has been updated by me.  Call with further questions as needed.  Time spent 15 minutes  Gerlean Ren MD [facility]

## 2020-03-27 NOTE — H&P (Signed)
History and Physical    Erika Day [id number] DOB: 1930-07-20 DOA: 03/26/2020  PCP: Mack Hook, MD  Patient coming from: Home   Chief Complaint:  Chief Complaint  Patient presents with  . 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 Alvy Bimler), MGUS, chronic kidney disease stage III, diastolic congestive heart failure, hypertension and sigmoid volvulus requiring sigmoid colectomy in August 2017 who presents to [facility] emergency department complaints of abdominal pain.  Patient explains that for the past 3 days she has been experiencing progressively worsening abdominal pain.  Patient describes this abdominal pain as epigastric but shifting in location.  Pain is waxing and waning in quality and can become severe in intensity.  When pain is particularly severe patient has associated shortness of breath.  Patient does complain of associated generalized weakness and poor appetite.  Patient denies any associated nausea, vomiting, dysuria.  Upon further questioning patient does complain of a longstanding history of chronic constipation.  Patient was to be taking a regimen of daily laxatives but states that in the recent past she stopped taking all of her laxatives because "they were not doing anything."  Due to patient's progressive worsening symptoms of waxing and waning severe abdominal pain patient was brought into [facility] emergency department for evaluation.  Upon evaluation in the emergency department patient was found to have markedly elevated D-dimer of greater than 20 which was followed by a CT angiogram of the chest revealing substantial atelectasis.  CT imaging of the abdomen and pelvis was also performed revealing large amount of stool with a distended sigmoid colon and rectum.  Initial troponin was found to be unremarkable.  The  hospitalist group was then called to assess patient for admission the hospital.   Review of Systems: A 10-system review of systems has been performed and all systems are negative with the exception of what is listed in the HPI.     Past Medical History:  Diagnosis Date  . Acid reflux disease   . Cancer ([place])   . CLL (chronic lymphocytic leukemia) ([place]) 05/15/2016  . DDD (degenerative disc disease), lumbar   . Degenerative joint disease   . Diabetes mellitus without complication [facility])    Patient states this was a misdiagnosis from years ago  . Dyslipidemia   . History of diabetes mellitus 04/19/2017  . Hypertension   . Marginal zone lymphoma of axilla ([place]) 05/15/2016  . Panic attacks   . Pneumonia 02/2016  . 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 Stroke [facility]) age 72 yo   poorly controlled DM and Hypertension  . Volvulus of sigmoid colon [facility])     Past Surgical History:  Procedure Laterality Date  . ABDOMINAL HYSTERECTOMY  ? 08/25/2011 ?   Has BSO for left benign ovarian tumor with torsion and broad ligament benign tumor, but does not appear had hysterectomy--[facility], NOT ovarian cancer  . CHOLECYSTECTOMY    . FEMUR IM NAIL Left 11/23/2016   Procedure: INTRAMEDULLARY (IM) NAIL FEMORAL;  Surgeon: Gaynelle Arabian, MD;  Location: [facility] ORS;  Service: Orthopedics;  Laterality: Left;  . FLEXIBLE SIGMOIDOSCOPY N/A 05/19/2016   Procedure: FLEXIBLE SIGMOIDOSCOPY;  Surgeon: [name], MD;  Location: [facility] ENDOSCOPY;  Service: Endoscopy;  Laterality: N/A;  . FLEXIBLE SIGMOIDOSCOPY N/A 06/28/2016   Procedure: FLEXIBLE SIGMOIDOSCOPY;  Surgeon: Ladene Artist, MD;  Location: [facility];  Service: Endoscopy;  Laterality: N/A;  . LAPAROSCOPIC SIGMOID COLECTOMY N/A 06/30/2016   Procedure: LAPAROSCOPIC SIGMOID COLECTOMY,rigid sigmoidoscopy, repair recurrent incisional hernia;  Surgeon: Michael Boston, MD;  Location: [facility];  Service: General;  Laterality: N/A;  . LAPAROTOMY N/A  04/29/2014   Procedure: EXPLORATORY LAPAROTOMY;  Surgeon: Imogene Burn. Georgette Dover, MD;  Location: [place];  Service: General;  Laterality: N/A;  . LAPAROTOMY N/A 04/16/2018   Procedure: EXPLORATORY LAPAROTOMY WITH LYSIS OF ADHESIONS;  Surgeon: Armandina Gemma, MD;  Location: [facility];  Service: General;  Laterality: N/A;  . LYSIS OF ADHESION N/A 04/29/2014   Procedure: EXTENSIVE LYSIS OF ADHESIONS;  Surgeon: Imogene Burn. Georgette Dover, MD;  Location: [place];  Service: General;  Laterality: N/A;  . VENTRAL HERNIA REPAIR N/A 04/29/2014   Procedure: PRIMARY REPAIR OF VENTRAL HERNIA;  Surgeon: Imogene Burn. Georgette Dover, MD;  Location: [place];  Service: General;  Laterality: N/A;     reports that she quit smoking about 30 years ago. Her smoking use included cigarettes. She quit smokeless tobacco use about 30 years ago.  Her smokeless tobacco use included snuff. She reports that she does not drink alcohol or use drugs.  Allergies  Allergen Reactions  . Morphine And Related Other (See Comments)    Blood sugar dropped one-time    Family History  Problem Relation Age of Onset  . Hypertension Mother   . Stroke Mother   . Arthritis Mother   . Alcohol abuse Father      Prior to Admission medications   Medication Sig Start Date End Date Taking? Authorizing Provider  cetirizine (ZYRTEC) 10 MG tablet 1 tab by mouth once daily with evening meal for allergies 10/06/19   Mack Hook, MD  furosemide (LASIX) 40 MG tablet Take 1 tablet (40 mg total) by mouth daily. 10/06/19 04/03/20  Mack Hook, MD  lisinopril (ZESTRIL) 10 MG tablet Take 1 tablet (10 mg total) by mouth daily. 10/06/19 04/03/20  Mack Hook, MD  mupirocin cream (BACTROBAN) 2 % Apply to affected areas twice daily for 7 days. 03/12/20   Mack Hook, MD  pantoprazole (PROTONIX) 40 MG tablet 1 tab by mouth daily at 10 pm on empty stomach 10/06/19   Mack Hook, MD  potassium chloride (KLOR-CON) 10 MEQ tablet TAKE 1 TABLET(10 MEQ) BY MOUTH DAILY  03/15/20   Mack Hook, MD  terbinafine (LAMISIL) 1 % cream Apply to affected areas twice daily for 14 days. 03/05/20   Mack Hook, MD    Physical Exam: Vitals:   03/26/20 2015 03/26/20 2030 03/26/20 2035 03/26/20 2202  BP: (!) 152/67 (!) 142/64  (!) 146/72  Pulse: 61 (!) 58  84  Resp: (!) 22 15  16   Temp:   97.6 F (36.4 C)   TempSrc:   Oral   SpO2: 100% 100%  98%  Weight:        Constitutional: Acute alert and oriented x3, patient is in mild distress due to abdominal discomfort. Skin: no rashes, no lesions, slightly poor skin turgor noted. Eyes: Pupils are equally reactive to light.  No evidence of scleral icterus or conjunctival pallor.  ENMT: Slightly dry mucous membranes noted.  Posterior pharynx clear of any exudate or lesions.   Neck: normal, supple, no masses, no thyromegaly.  No evidence of jugular venous distension.   Respiratory: clear to auscultation bilaterally, no wheezing, no crackles. Normal respiratory effort. No accessory muscle use.  Cardiovascular: Regular rate and rhythm, 3 out of 6 systolic murmur heard best  over the aortic valve, trace bilateral lower extremity pitting edema, left greater than right.  2+ pedal pulses. No carotid bruits.  Chest:   Nontender without crepitus or deformity.   Back:   Nontender without crepitus or deformity. Abdomen: Notable generalized abdominal tenderness with lower abdominal mass noted on deep palpation.  Positive bowel sounds noted in all quadrants.   Musculoskeletal: No joint deformity upper and lower extremities. Good ROM, no contractures. Normal muscle tone.  Neurologic: CN 2-12 grossly intact. Sensation intact, strength noted to be 5 out of 5 in all 4 extremities.  Patient is following all commands.  Patient is responsive to verbal stimuli.   Psychiatric: Patient presents as a normal mood with appropriate affect.  Patient seems to possess insight as to theircurrent situation.     Labs on Admission: I have  personally reviewed following labs and imaging studies -   CBC: Recent Labs  Lab 03/26/20 1739  WBC 2.6*  NEUTROABS 1.2*  HGB 9.9*  HCT 31.7*  MCV 91.9  PLT [id number]*   Basic Metabolic Panel: Recent Labs  Lab 03/26/20 1739  NA 138  K 4.6  CL 102  CO2 24  GLUCOSE 150*  BUN 32*  CREATININE 1.55*  CALCIUM 9.9   GFR: Estimated Creatinine Clearance: 23.1 mL/min (A) (by C-G formula based on SCr of 1.55 mg/dL (H)). Liver Function Tests: Recent Labs  Lab 03/26/20 1739  AST 19  ALT 13  ALKPHOS 48  BILITOT 0.2*  PROT 6.7  ALBUMIN 3.7   No results for input(s): LIPASE, AMYLASE in the last 168 hours. No results for input(s): AMMONIA in the last 168 hours. Coagulation Profile: No results for input(s): INR, PROTIME in the last 168 hours. Cardiac Enzymes: No results for input(s): CKTOTAL, CKMB, CKMBINDEX, TROPONINI in the last 168 hours. BNP (last 3 results) No results for input(s): PROBNP in the last 8760 hours. HbA1C: No results for input(s): HGBA1C in the last 72 hours. CBG: No results for input(s): GLUCAP in the last 168 hours. Lipid Profile: No results for input(s): CHOL, HDL, LDLCALC, TRIG, CHOLHDL, LDLDIRECT in the last 72 hours. Thyroid Function Tests: No results for input(s): TSH, T4TOTAL, FREET4, T3FREE, THYROIDAB in the last 72 hours. Anemia Panel: No results for input(s): VITAMINB12, FOLATE, FERRITIN, TIBC, IRON, RETICCTPCT in the last 72 hours. Urine analysis:    Component Value Date/Time   COLORURINE YELLOW 03/26/2020 [place] 03/26/2020 1840   LABSPEC 1.009 03/26/2020 1840   PHURINE 8.0 03/26/2020 1840   GLUCOSEU NEGATIVE 03/26/2020 1840   HGBUR NEGATIVE 03/26/2020 1840   BILIRUBINUR NEGATIVE 03/26/2020 1840   BILIRUBINUR neg 10/20/2018 1649   KETONESUR NEGATIVE 03/26/2020 1840   PROTEINUR NEGATIVE 03/26/2020 1840   UROBILINOGEN 0.2 10/20/2018 1649   UROBILINOGEN 0.2 05/17/2015 2307   NITRITE NEGATIVE 03/26/2020 1840   LEUKOCYTESUR  NEGATIVE 03/26/2020 1840    Radiological Exams on Admission - Personally Reviewed: CT Angio Chest PE W and/or Wo Contrast  Result Date: 03/26/2020 CLINICAL DATA:  Shortness of breath. EXAM: CT ANGIOGRAPHY CHEST WITH CONTRAST TECHNIQUE: Multidetector CT imaging of the chest was performed using the standard protocol during bolus administration of intravenous contrast. Multiplanar CT image reconstructions and MIPs were obtained to evaluate the vascular anatomy. CONTRAST:  46mL OMNIPAQUE IOHEXOL 350 MG/ML SOLN COMPARISON:  February 13, 2016 FINDINGS: Cardiovascular: There is marked severity calcification of the thoracic aorta. Satisfactory opacification of the pulmonary arteries to the segmental level. No evidence of pulmonary embolism. Normal heart size. No pericardial  effusion. Mediastinum/Nodes: No enlarged mediastinal, hilar, or axillary lymph nodes. Thyroid gland, trachea, and esophagus demonstrate no significant findings. Lungs/Pleura: Mild to moderate severity right basilar atelectasis is seen with mild atelectasis noted within the left lung base. There is marked severity elevation of the right hemidiaphragm. This is seen on the prior study. There is no evidence of a pleural effusion or pneumothorax. Upper Abdomen: There is a small hiatal hernia. Multiple surgical clips are seen within the gallbladder fossa. Musculoskeletal: Multilevel degenerative changes are seen throughout the thoracic spine. Review of the MIP images confirms the above findings. IMPRESSION: 1. No evidence of pulmonary embolism. 2. Mild to moderate severity right basilar atelectasis with mild left basilar atelectasis. 3. Marked severity elevation of the right hemidiaphragm. 4. Small hiatal hernia. Aortic Atherosclerosis (ICD10-I70.0). Electronically Signed   By: Virgina Norfolk M.D.   On: 03/26/2020 23:09   CT ABDOMEN PELVIS W CONTRAST  Result Date: 03/26/2020 CLINICAL DATA:  Shortness of breath. EXAM: CT ABDOMEN AND PELVIS WITH  CONTRAST TECHNIQUE: Multidetector CT imaging of the abdomen and pelvis was performed using the standard protocol following bolus administration of intravenous contrast. CONTRAST:  68mL OMNIPAQUE IOHEXOL 350 MG/ML SOLN COMPARISON:  April 15, 2018 FINDINGS: Lower chest: Mild to moderate severity atelectasis is seen within the bilateral lung bases, right greater than left. There is stable marked severity elevation of the right hemidiaphragm. Hepatobiliary: No focal liver abnormality is seen. Status post cholecystectomy. No biliary dilatation. Pancreas: Unremarkable. No pancreatic ductal dilatation or surrounding inflammatory changes. Spleen: Normal in size without focal abnormality. Adrenals/Urinary Tract: The right adrenal gland is not clearly identified. The left adrenal gland is unremarkable. The kidneys are mildly decreased in size, without renal calculi, focal lesions, or hydronephrosis. Bladder is unremarkable. Stomach/Bowel: Stomach is within normal limits. The appendix is not clearly identified. Surgically anastomosed bowel is seen within the expected region of the mid sigmoid colon. A very large amount of stool is seen within a distended distal sigmoid colon and rectum. The remaining loops of large and small bowel are normal in caliber. Vascular/Lymphatic: There is marked severity calcification of the abdominal aorta. No enlarged abdominal or pelvic lymph nodes. Reproductive: The uterus is small in size and contains several subcentimeter coarse parenchymal calcifications. The bilateral adnexa are unremarkable. Other: No abdominal wall hernia or abnormality. No abdominopelvic ascites. Musculoskeletal: A metallic density intramedullary rod and compression screw device are seen within the proximal left femur. Multilevel marked severity degenerative changes are noted throughout the lumbar spine. IMPRESSION: 1. Very large amount of stool within a distended distal sigmoid colon and rectum. 2. Mild to moderate  severity atelectasis within the bilateral lung bases, right greater than left. 3. Stable marked severity elevation of the right hemidiaphragm. 4. Evidence of prior cholecystectomy. 5. Prior open reduction internal fixation of the proximal left femur. Aortic Atherosclerosis (ICD10-I70.0). Electronically Signed   By: Virgina Norfolk M.D.   On: 03/26/2020 23:14   DG Chest Portable 1 View  Result Date: 03/26/2020 CLINICAL DATA:  Weakness, short of breath, lower extremity edema EXAM: PORTABLE CHEST 1 VIEW COMPARISON:  06/28/2019 FINDINGS: Single frontal view of the chest demonstrates a stable cardiac silhouette. There is chronic elevation of the right hemidiaphragm, with new right basilar consolidation. Left chest is clear. No pneumothorax. IMPRESSION: 1. Right basilar consolidation, which could reflect atelectasis or airspace disease. 2. Chronic elevation right hemidiaphragm. Electronically Signed   By: Randa Ngo M.D.   On: 03/26/2020 17:45   DG Abd 2 Views  Result  Date: 03/26/2020 CLINICAL DATA:  Abdominal pain, lower extremity edema EXAM: ABDOMEN - 2 VIEW COMPARISON:  04/15/2018 FINDINGS: 2 supine frontal views of the abdomen and pelvis demonstrate an unremarkable bowel gas pattern. Significant retained stool within the rectal vault consistent with fecal impaction. There are no masses or abnormal calcifications. There is chronic elevation of the right hemidiaphragm. No masses or abnormal calcifications. IMPRESSION: 1. Significant retained stool within the rectal vault which could reflect fecal impaction. 2. No evidence of bowel obstruction or ileus. Electronically Signed   By: Randa Ngo M.D.   On: 03/26/2020 21:15    EKG: Personally reviewed.  Rhythm is normal sinus rhythm with heart rate of 69 beats a minute.  Q waves noted in the anterior septal leads.  No dynamic ST segment changes appreciated.  Assessment/Plan Principal Problem:   Abdominal pain   Patient presenting with vague waxing  and waning severe abdominal pain  ER provider was particularly concerned about a possible cardiac ischemic equivalent, initial cardiac enzymes and EKG are unremarkable up to this point.  Based on patient's history of stopping her laxatives in the recent past with substantial constipation noted on CT imaging of the abdomen, I believe patient is currently suffering from waxing waning abdominal pain due to severe constipation.  I have advised manual disimpaction however patient is declined and is requesting other means to relieve her severe constipation.  We will therefore attempt to provide patient with a combination of MiraLAX and Fleet enema and monitor for symptomatic improvement throughout the evening.  In addition to initiation of laxative and enema, will gently hydrate throughout the evening with intravenous fluids while being mindful of history of congestive heart failure.  In the meantime, we will continue to cycle cardiac enzymes throughout the evening, monitor patient on telemetry, obtain echocardiogram in the morning considering known history of moderate aortic stenosis.  We may need to revisit fecal disimpaction if constipation does not resolve with these measures.  Patient will need to be on a permanent aggressive daily bowel regimen with MiraLAX once to twice daily in combination with senna nightly.  Active Problems:   Chronic constipation   Please see assessment and plan above    Essential hypertension   Continue home regimen of antihypertensive therapy  Temporarily holding home diuretic    Pancytopenia (HCC)   Longstanding known history of pancytopenia considering history of MGUS, marginal zone lymphoma    Elevated d-dimer   Markedly elevated D-dimer, likely secondary to known history of marginal zone lymphoma  CT angiogram of the chest reveals no evidence of pulmonary embolism.  Bilateral lower extremity ultrasound pending.    Marginal zone lymphoma of axilla  [facility])   Outpatient follow-up with hematology/oncology as needed.    MGUS (monoclonal gammopathy of unknown significance)   Outpatient follow-up with hematology/oncology as needed.      Code Status:  Limited code Family Communication: Have discussed the case with the daughter via phone conversation was been updated on plan of care.  Status is: Observation  The patient remains OBS appropriate and will d/c before 2 midnights.  Dispo: The patient is from: Home              Anticipated d/c is to: Home              Anticipated d/c date is: 2 days              Patient currently is not medically stable to d/c.  Vernelle Emerald MD [facility] Pager [phone]  If 7PM-7AM, please contact night-coverage [email or web address] Use universal [place] password for that web site. If you do not have the password, please call the hospital operator.  03/27/2020, 1:25 AM

## 2020-03-27 NOTE — Progress Notes (Signed)
  Echocardiogram 2D Echocardiogram has been performed.  Erika Day 03/27/2020, 2:11 PM

## 2020-03-27 NOTE — Progress Notes (Signed)
Venous duplex       has been completed. Preliminary results can be found under CV proc through chart review. Jill Parker, BS, RDMS, RVT   

## 2020-03-27 NOTE — TOC Initial Note (Signed)
Transition of Care [facility]) - Initial/Assessment Note    Patient Details  Name: Erika Day MRN: [id number] Date of Birth: 1930-08-14  [facility]) CM/SW Contact:    Marilu Favre, RN Phone Number: 03/27/2020, 11:19 AM  Clinical Narrative:                  Patient from home with husband. Has a caregiver 5 days a week for 5 hours and every Saturday for 2 hours.   Patient's PCP is Dr Amil Amen. Patient has [place] RN and HHPT but unsure name of [place] agency. NCM called Dr Melissa Noon office, [facility] agency is [facility]. Dr Amil Amen office notified  patient is admitted to hospital, they just saw her May 11, next appointment is August 11 at 1100 am.   Larose Kells with [facility] she is active for [facility] for wound care and HHPT. Placed resumption of care orders.   Patient has wheel chair, hospital bed, walker and shoer chair at home already.   Will continue to follow.  Expected Discharge Plan: [place] Barriers to Discharge: Continued Medical Work up   Patient Goals and CMS Choice Patient states their goals for this hospitalization and ongoing recovery are:: to return to home CMS Medicare.gov Compare Post Acute Care list provided to:: Patient Choice offered to / list presented to : Patient  Expected Discharge Plan and Services Expected Discharge Plan: [place]   Discharge Planning Services: CM Consult Post Acute Care Choice: [place] arrangements for the past 2 months: Single Family Home                 DME Arranged: N/A DME Agency: NA       HH Arranged: PT, RN [place] [facility]: [facility] [employer] Date [place]: 03/27/20 Time [place]: 1118 Representative spoke with at [place]: [place]  Prior Living Arrangements/Services Living arrangements for the past 2 months: [place] Lives with:: Spouse Patient language and need for interpreter reviewed:: Yes         Need for Family Participation in Patient Care: Yes (Comment) Care giver support system in place?: Yes (comment) Current home services: DME, Home PT, Home RN Criminal Activity/Legal Involvement Pertinent to Current Situation/Hospitalization: No - Comment as needed  Activities of Daily Living      Permission Sought/Granted Permission sought to share information with : PCP    Share Information with NAME: Dr Amil Amen           Emotional Assessment Appearance:: Appears stated age Attitude/Demeanor/Rapport: Engaged Affect (typically observed): Accepting Orientation: : Oriented to Self, Oriented to Place, Oriented to  Time, Oriented to Situation Alcohol / Substance Use: Not Applicable Psych Involvement: No (comment)  Admission diagnosis:  Pain of upper abdomen [R10.10] Abdominal pain [R10.9] Constipation, unspecified constipation type [K59.00] Chest pain, unspecified type [R07.9] Patient Active Problem List   Diagnosis Date Noted  . Elevated d-dimer 03/27/2020  . Chronic kidney disease, stage 3b 03/27/2020  . Uncontrolled type 2 diabetes mellitus with hyperglycemia ([place]) 03/27/2020  . Pressure injury of skin 03/27/2020  . Goals of care, counseling/discussion 01/23/2020  . Recurrent falls 09/05/2019  . CHF (congestive heart failure) ([place]) 06/28/2019  . MGUS (monoclonal gammopathy of unknown significance) 02/11/2018  . History of diabetes mellitus 04/19/2017  . Hypercalcemia 02/12/2017  . Chronic leukopenia 02/12/2017  . Postoperative anemia due to acute blood loss 11/26/2016  . Fall 11/23/2016  .  Closed fracture of intertrochanteric section of femur ([place]) 11/23/2016  . Pancytopenia ([place]) 11/13/2016  . Sinusitis 10/14/2016  . Bleeding external hemorrhoids 08/20/2016  . Recurrent ventral incisional hernia s/p primary repair 06/30/2016 07/01/2016  . Lower extremity edema 05/20/2016  . Protein-calorie malnutrition, severe ([place]) 05/20/2016  . Sigmoid volvulus s/p lap sigmoid  colectomy 06/30/2016 05/19/2016  . 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 Marginal zone lymphoma of axilla ([place]) 05/15/2016  . Chronic constipation 05/15/2016  . Other fatigue 05/15/2016  . Anemia, chronic disease 02/13/2016  . Thrombocytopenia ([place]) 02/13/2016  . Kyphosis 02/01/2016  . Pressure ulcer of coccygeal region 01/31/2016  . Mobility impaired 01/31/2016  . Osteoarthritis 01/31/2016  . Abdominal pain 04/23/2014  . Essential hypertension 09/06/2011  . Ovarian torsion 08/25/2011   PCP:  Mack Hook, MD Pharmacy:   [facility] [address] [place] [address] Phone: [phone] Fax: [place], [place] [address] [place] [address] Phone: [phone] Fax: [phone]     Social Determinants of Health (SDOH) Interventions    Readmission Risk Interventions No flowsheet data found.

## 2020-03-27 NOTE — Progress Notes (Signed)
Erika Day is a 86 y.o. female patient admitted from ED awake, alert - oriented  X 4 - no acute distress noted.  VSS - Blood pressure (!) 184/69, pulse 65, temperature 98.6 F (37 C), temperature source Oral, resp. rate 17, weight 70.8 kg, SpO2 100 %.    IV in place, occlusive dsg intact without redness.    Will cont to eval and treat per MD orders.  Vidal Schwalbe, RN 03/27/2020 4:25 AM

## 2020-03-27 NOTE — Evaluation (Signed)
Physical Therapy Evaluation Patient Details Name: Erika Day MRN: [id number] DOB: Nov 05, 1929 Today's Date: 03/27/2020   History of Present Illness  Pt is an 86 y/o female admitted secondary to abdominal pain from obstipation. PMH includes DM, CVA, CLL, CKD, dCHF, HTN, and lymphoma.   Clinical Impression  Pt admitted secondary to problem above with deficits below. Pt requiring min A to roll for removal of old bed pan and placement of new bed pan, as she had just gotten enema. Further mobility deferred this session as pt having BM. Pt reports she required some assist to perform squat pivot and lateral scoot transfers at baseline. Reports she was active with HHservices. Feel pt will likely progress well and be able to return home with [facility] services and assist, however, will need to ensure safety with transfers during next session. Will continue to follow acutely to maximize functional mobility independence and safety.     Follow Up Recommendations Home health PT;Supervision/Assistance - 24 hour(pending progression )    Equipment Recommendations  Other (comment)(TBD)    Recommendations for Other Services       Precautions / Restrictions Precautions Precautions: Fall Restrictions Weight Bearing Restrictions: No      Mobility  Bed Mobility Overal bed mobility: Needs Assistance Bed Mobility: Rolling Rolling: Min assist         General bed mobility comments: min A for assist with rolling for replacement of bed pan. Pt had enema and requesting to be placed on another bed pan as the other was full.   Transfers                    Ambulation/Gait                Stairs            Wheelchair Mobility    Modified Rankin (Stroke Patients Only)       Balance                                             Pertinent Vitals/Pain Pain Assessment: Faces Faces Pain Scale: Hurts a little bit Pain Location: abdomen  Pain Descriptors / Indicators:  Aching Pain Intervention(s): Limited activity within patient's tolerance;Monitored during session;Repositioned    Home Living Family/patient expects to be discharged to:: Private residence Living Arrangements: Children;Spouse/significant other Available Help at Discharge: Family;Available 24 hours/day;Personal care attendant Type of Home: House Home Access: Ramped entrance     Home Layout: One level Home Equipment: [employer] - 2 wheels;Wheelchair - [employer];Hospital bed Additional Comments: aide assits 5 days/week for 5 hours and 2 hours on saturday    Prior Function Level of Independence: Needs assistance   Gait / Transfers Assistance Needed: Pt reports sometimes needs assist to transfers.   ADL's / Homemaking Assistance Needed: Requires assist for sponge bathing and toilet transfers. Uses BSC for toileting        Hand Dominance        Extremity/Trunk Assessment   Upper Extremity Assessment Upper Extremity Assessment: Defer to OT evaluation    Lower Extremity Assessment Lower Extremity Assessment: Generalized weakness       Communication   Communication: No difficulties  Cognition Arousal/Alertness: Awake/alert Behavior During Therapy: WFL for tasks assessed/performed Overall Cognitive Status: Within Functional Limits for tasks assessed  General Comments      Exercises     Assessment/Plan    PT Assessment Patient needs continued PT services  PT Problem List Decreased strength;Decreased balance;Decreased mobility       PT Treatment Interventions DME instruction;Functional mobility training;Therapeutic activities;Balance training;Therapeutic exercise;Patient/family education;Wheelchair mobility training    PT Goals (Current goals can be found in the Care Plan section)  Acute Rehab PT Goals Patient Stated Goal: to go home PT Goal Formulation: With patient Time For Goal Achievement:  04/10/20 Potential to Achieve Goals: Good    Frequency Min 3X/week   Barriers to discharge        Co-evaluation               AM-PAC PT "6 Clicks" Mobility  Outcome Measure Help needed turning from your back to your side while in a flat bed without using bedrails?: A Little Help needed moving from lying on your back to sitting on the side of a flat bed without using bedrails?: A Little Help needed moving to and from a bed to a chair (including a wheelchair)?: A Lot Help needed standing up from a chair using your arms (e.g., wheelchair or bedside chair)?: A Lot Help needed to walk in hospital room?: Total Help needed climbing 3-5 steps with a railing? : Total 6 Click Score: 12    End of Session   Activity Tolerance: Other (comment)(limited as pt had enema) Patient left: in bed;with call bell/phone within reach;with bed alarm set Nurse Communication: Mobility status PT Visit Diagnosis: Other abnormalities of gait and mobility (R26.89);Difficulty in walking, not elsewhere classified (R26.2)    Time: [id number] PT Time Calculation (min) (ACUTE ONLY): 20 min   Charges:   PT Evaluation $PT Eval Moderate Complexity: 1 Mod          Reuel Derby, PT, DPT  Acute Rehabilitation Services  Pager: [phone] Office: [phone]   Rudean Hitt 03/27/2020, 3:04 PM

## 2020-03-27 NOTE — Care Management Obs Status (Signed)
[facility] NOTIFICATION   Patient Details  Name: RHYLI FERRERAS MRN: [id number] Date of Birth: 1930/06/26   Medicare Observation Status Notification Given:  Yes    Marilu Favre, RN 03/27/2020, 11:01 AM

## 2020-03-28 ENCOUNTER — Observation Stay (HOSPITAL_COMMUNITY): Payer: Medicare Other

## 2020-03-28 DIAGNOSIS — R7989 Other specified abnormal findings of blood chemistry: Secondary | ICD-10-CM | POA: Diagnosis not present

## 2020-03-28 DIAGNOSIS — Z9049 Acquired absence of other specified parts of digestive tract: Secondary | ICD-10-CM | POA: Diagnosis not present

## 2020-03-28 DIAGNOSIS — D472 Monoclonal gammopathy: Secondary | ICD-10-CM | POA: Diagnosis present

## 2020-03-28 DIAGNOSIS — E1122 Type 2 diabetes mellitus with diabetic chronic kidney disease: Secondary | ICD-10-CM | POA: Diagnosis present

## 2020-03-28 DIAGNOSIS — Z87891 Personal history of nicotine dependence: Secondary | ICD-10-CM | POA: Diagnosis not present

## 2020-03-28 DIAGNOSIS — I5032 Chronic diastolic (congestive) heart failure: Secondary | ICD-10-CM | POA: Diagnosis present

## 2020-03-28 DIAGNOSIS — I130 Hypertensive heart and chronic kidney disease with heart failure and stage 1 through stage 4 chronic kidney disease, or unspecified chronic kidney disease: Secondary | ICD-10-CM | POA: Diagnosis present

## 2020-03-28 DIAGNOSIS — E1165 Type 2 diabetes mellitus with hyperglycemia: Secondary | ICD-10-CM

## 2020-03-28 DIAGNOSIS — Z20822 Contact with and (suspected) exposure to covid-19: Secondary | ICD-10-CM | POA: Diagnosis present

## 2020-03-28 DIAGNOSIS — Z8673 Personal history of transient ischemic attack (TIA), and cerebral infarction without residual deficits: Secondary | ICD-10-CM | POA: Diagnosis not present

## 2020-03-28 DIAGNOSIS — Z79899 Other long term (current) drug therapy: Secondary | ICD-10-CM | POA: Diagnosis not present

## 2020-03-28 DIAGNOSIS — D61818 Other pancytopenia: Secondary | ICD-10-CM | POA: Diagnosis present

## 2020-03-28 DIAGNOSIS — K5641 Fecal impaction: Secondary | ICD-10-CM | POA: Diagnosis present

## 2020-03-28 DIAGNOSIS — C8590 Non-Hodgkin lymphoma, unspecified, unspecified site: Secondary | ICD-10-CM | POA: Diagnosis present

## 2020-03-28 DIAGNOSIS — M5136 Other intervertebral disc degeneration, lumbar region: Secondary | ICD-10-CM | POA: Diagnosis present

## 2020-03-28 DIAGNOSIS — Z8249 Family history of ischemic heart disease and other diseases of the circulatory system: Secondary | ICD-10-CM | POA: Diagnosis not present

## 2020-03-28 DIAGNOSIS — R109 Unspecified abdominal pain: Secondary | ICD-10-CM | POA: Diagnosis not present

## 2020-03-28 DIAGNOSIS — N1832 Chronic kidney disease, stage 3b: Secondary | ICD-10-CM

## 2020-03-28 DIAGNOSIS — R1010 Upper abdominal pain, unspecified: Secondary | ICD-10-CM | POA: Diagnosis present

## 2020-03-28 DIAGNOSIS — I10 Essential (primary) hypertension: Secondary | ICD-10-CM | POA: Diagnosis not present

## 2020-03-28 DIAGNOSIS — Z885 Allergy status to narcotic agent status: Secondary | ICD-10-CM | POA: Diagnosis not present

## 2020-03-28 DIAGNOSIS — E785 Hyperlipidemia, unspecified: Secondary | ICD-10-CM | POA: Diagnosis present

## 2020-03-28 DIAGNOSIS — Z856 Personal history of leukemia: Secondary | ICD-10-CM | POA: Diagnosis not present

## 2020-03-28 DIAGNOSIS — K219 Gastro-esophageal reflux disease without esophagitis: Secondary | ICD-10-CM | POA: Diagnosis present

## 2020-03-28 DIAGNOSIS — L89892 Pressure ulcer of other site, stage 2: Secondary | ICD-10-CM | POA: Diagnosis present

## 2020-03-28 DIAGNOSIS — C8584 Other specified types of non-Hodgkin lymphoma, lymph nodes of axilla and upper limb: Secondary | ICD-10-CM

## 2020-03-28 DIAGNOSIS — K5909 Other constipation: Secondary | ICD-10-CM | POA: Diagnosis not present

## 2020-03-28 LAB — COMPREHENSIVE METABOLIC PANEL
ALT: 10 U/L (ref 0–44)
AST: 23 U/L (ref 15–41)
Albumin: 3.4 g/dL — ABNORMAL LOW (ref 3.5–5.0)
Alkaline Phosphatase: 53 U/L (ref 38–126)
Anion gap: 11 (ref 5–15)
BUN: 20 mg/dL (ref 8–23)
CO2: 21 mmol/L — ABNORMAL LOW (ref 22–32)
Calcium: 9.7 mg/dL (ref 8.9–10.3)
Chloride: 104 mmol/L (ref 98–111)
Creatinine, Ser: 1.38 mg/dL — ABNORMAL HIGH (ref 0.44–1.00)
GFR calc Af Amer: 39 mL/min — ABNORMAL LOW (ref >60)
GFR calc non Af Amer: 34 mL/min — ABNORMAL LOW (ref >60)
Glucose, Bld: 103 mg/dL — ABNORMAL HIGH (ref 70–99)
Potassium: 4.6 mmol/L (ref 3.5–5.1)
Sodium: 136 mmol/L (ref 135–145)
Total Bilirubin: 0.7 mg/dL (ref 0.3–1.2)
Total Protein: 6.6 g/dL (ref 6.5–8.1)

## 2020-03-28 LAB — GLUCOSE, CAPILLARY
Glucose-Capillary: 104 mg/dL — ABNORMAL HIGH (ref 70–99)
Glucose-Capillary: 88 mg/dL (ref 70–99)
Glucose-Capillary: 74 mg/dL (ref 70–99)
Glucose-Capillary: 81 mg/dL (ref 70–99)

## 2020-03-28 LAB — CBC
HCT: 35 % — ABNORMAL LOW (ref 36.0–46.0)
Hemoglobin: 11.2 g/dL — ABNORMAL LOW (ref 12.0–15.0)
MCH: 28.9 pg (ref 26.0–34.0)
MCHC: 32 g/dL (ref 30.0–36.0)
MCV: 90.2 fL (ref 80.0–100.0)
Platelets: 156 10*3/uL (ref 150–400)
RBC: 3.88 MIL/uL (ref 3.87–5.11)
RDW: 12.4 % (ref 11.5–15.5)
WBC: 3 10*3/uL — ABNORMAL LOW (ref 4.0–10.5)
nRBC: 0 % (ref 0.0–0.2)

## 2020-03-28 LAB — MAGNESIUM: Magnesium: 1.9 mg/dL (ref 1.7–2.4)

## 2020-03-28 MED ORDER — MAGNESIUM CITRATE PO SOLN: 1.0000 | Freq: Once | ORAL | Status: DC | PRN

## 2020-03-28 MED ORDER — BISACODYL 10 MG RE SUPP
10.0000 mg | Freq: Once | RECTAL | Status: AC
  Administered 2020-03-28: 10 mg via RECTAL
  Filled 2020-03-28: qty 1

## 2020-03-28 MED ORDER — SODIUM CHLORIDE 0.9 % IV SOLN: INTRAVENOUS | Status: AC

## 2020-03-28 NOTE — Progress Notes (Signed)
Occupational Therapy Evaluation Patient Details Name: Erika Day MRN: [id number] DOB: [birth year] Today's Date: 03/28/2020    History of Present Illness Pt is an 86 y/o female admitted secondary to abdominal pain from obstipation. PMH includes DM, CVA, CLL, CKD, dCHF, HTN, and lymphoma.    Clinical Impression   Prior to hospitalization, pt lived with husband and received assistance with ADLs/IADLs from aide 5 days/week (5 hours M-F and 2 hours on Sat). Pt reports needing mod assist with bathing, min assist with dressing, and no assistance with grooming and toileting. Pt admitted for above and limited by decreased strength, decreased activity tolerance, and increased pain. Today, pt agreeable to OT eval, received semi-reclined in bed. Assessed BADLs, bed mobility, and functional transfers. Pt requires mod assist with elevated surface for sit>stand using RW for support. Pt declined stand pivot transfer to [facility] secondary to fatigue. Pt requires min assist for donning slip-on shoes and total assist for perianal/perineal care in standing. Pt min guard-min assist for bed mobility using bed railings. Pt would benefit from continued skilled OT services to address functional transfers and BADLs safely. Pt prefers to return home with [facility] services and family/aide support. Will continue to follow pt acutely as able.     Follow Up Recommendations  Home health OT;Supervision/Assistance - 24 hour    Equipment Recommendations  None recommended by OT    Recommendations for Other Services       Precautions / Restrictions Precautions Precautions: Fall Restrictions Weight Bearing Restrictions: No      Mobility Bed Mobility Overal bed mobility: Needs Assistance Bed Mobility: Rolling;Sidelying to Sit;Sit to Supine Rolling: Supervision Sidelying to sit: [facility] elevated;Min assist          Transfers Overall transfer level: Needs assistance Equipment used: Rolling walker (2 wheeled) Transfers: Sit  to/from Stand;Lateral/Scoot Transfers Sit to Stand: Mod assist;From elevated surface        Lateral/Scoot Transfers: Supervision General transfer comment: pt requested to attempt pivot transfer next tx session instead of today secondary to fatigue/weakness    Balance Overall balance assessment: Needs assistance Sitting-balance support: Bilateral upper extremity supported;Feet supported Sitting balance-Leahy Scale: Fair     Standing balance support: Bilateral upper extremity supported;During functional activity Standing balance-Leahy Scale: Fair Standing balance comment: requires elevated surface for sit to stand                           ADL either performed or assessed with clinical judgement   ADL Overall ADL's : Needs assistance/impaired Eating/Feeding: Set up;Sitting   Grooming: Wash/dry hands;Wash/dry face;Set up;Sitting   Upper Body Bathing: Minimal assistance;Sitting   Lower Body Bathing: Moderate assistance;Sitting/lateral leans;Sit to/from stand   Upper Body Dressing : Set up;Sitting   Lower Body Dressing: Moderate assistance;Sitting/lateral leans;Sit to/from stand   Toilet Transfer: Minimal assistance;Stand-pivot;BSC;RW   Toileting- Clothing Manipulation and Hygiene: Maximal assistance;Sitting/lateral lean;Sit to/from stand(with RW for support)   Tub/ Shower Transfer: (sponge bathes at home)   Functional mobility during ADLs: Minimal assistance;Moderate assistance;Rolling walker General ADL Comments: required mod assist for sit>stand from bed and min guard for maintaining standing balance while simultaneously performing BADLs     Vision Baseline Vision/History: 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) Patient Visual Report: Other (comment)(L eye cateract)       Perception     Praxis      Pertinent Vitals/Pain Pain Assessment: Faces Faces Pain Scale: Hurts a little bit Pain Location: lower  chest/abdomen Pain  Descriptors / Indicators: Dull;Aching Pain Intervention(s): Monitored during session;Repositioned;Limited activity within patient's tolerance     Hand Dominance Right   Extremity/Trunk Assessment Upper Extremity Assessment Upper Extremity Assessment: Overall WFL for tasks assessed   Lower Extremity Assessment Lower Extremity Assessment: Defer to PT evaluation   Cervical / Trunk Assessment Cervical / Trunk Assessment: Kyphotic   Communication Communication Communication: No difficulties   Cognition Arousal/Alertness: Awake/alert Behavior During Therapy: WFL for tasks assessed/performed Overall Cognitive Status: Within Functional Limits for tasks assessed                                     General Comments  pt able to verbalize personal needs    Exercises     Shoulder Instructions      Home Living Family/patient expects to be discharged to:: Private residence Living Arrangements: Children;Spouse/significant other Available Help at Discharge: Family;Available 24 hours/day;Personal care attendant Type of Home: House Home Access: Ramped entrance     Home Layout: One level     Bathroom Shower/Tub: Tub/shower unit         Home Equipment: Environmental consultant - 2 wheels;Wheelchair - [employer];Hospital bed   Additional Comments: personal care attendent assists pt 5 days/week (5 hours M-F and 2 hours Sat); husband lives with her; daughter consistently drops in to assist as needed too      Prior Functioning/Environment Level of Independence: Needs assistance  Gait / Transfers Assistance Needed: Pt reports sometimes needing assistance for stand pivot transfers to wheelchair and toilet ADL's / Homemaking Assistance Needed: requires mod assist for sponge bathing, min assist for dressing, and utilizes BSC for toileting            OT Problem List: Decreased strength;Decreased activity tolerance;Impaired balance (sitting and/or standing);Decreased  coordination;Pain      OT Treatment/Interventions: Self-care/ADL training;Therapeutic exercise;Energy conservation;Therapeutic activities    OT Goals(Current goals can be found in the care plan section) Acute Rehab OT Goals Patient Stated Goal: to go home  OT Frequency: Min 2X/week   Barriers to D/C:            Co-evaluation              AM-PAC OT "6 Clicks" Daily Activity     Outcome Measure Help from another person eating meals?: None Help from another person taking care of personal grooming?: A Little Help from another person toileting, which includes using toliet, bedpan, or urinal?: A Lot Help from another person bathing (including washing, rinsing, drying)?: A Lot Help from another person to put on and taking off regular upper body clothing?: A Little Help from another person to put on and taking off regular lower body clothing?: A Lot 6 Click Score: 16   End of Session Equipment Utilized During Treatment: Gait belt;Rolling walker Nurse Communication: Mobility status  Activity Tolerance: Patient tolerated treatment well;Patient limited by fatigue Patient left: in bed;with call bell/phone within reach;with bed alarm set  OT Visit Diagnosis: Unsteadiness on feet (R26.81);Muscle weakness (generalized) (M62.81);Pain Pain - part of body: (chest)                Time: [id number] OT Time Calculation (min): 39 min Charges:  OT General Charges $OT Visit: 1 Visit OT Evaluation $OT Eval Moderate Complexity: 1 Mod OT Treatments $Self Care/Home Management : 8-22 mins  Michel Bickers, OTR/[facility] Rehab Services [phone]   Francesca Jewett 03/28/2020, 5:25  PM

## 2020-03-28 NOTE — Progress Notes (Signed)
PROGRESS NOTE    Erika Day  [id number] DOB: Aug 13, 1930 DOA: 03/26/2020 PCP: Mack Hook, MD   Brief Narrative:  86 year old with history of GERD, marginal zone lymphoma follows with Dr. Alvy Bimler, MGUS, CKD stage III, chronic constipation, diastolic CHF, HTN, sigmoid volvulus status post colectomy in 2017 presents with abdominal pain.  Found to have severe constipation started on aggressive bowel regimen.   Assessment & Plan:   Principal Problem:   Abdominal pain Active Problems:   Essential hypertension   Marginal zone lymphoma of axilla (HCC)   Chronic constipation   Pancytopenia (HCC)   MGUS (monoclonal gammopathy of unknown significance)   Elevated d-dimer   Chronic kidney disease, stage 3b   Uncontrolled type 2 diabetes mellitus with hyperglycemia (HCC)   Pressure injury of skin  Abdominal pain secondary to severe abdominal constipation -Still having slight abdominal and rectal discomfort due to hard stools mild nausea but no vomiting.  Continue aggressive bowel regimen at this time-both p.o. and enemas/suppositories -IV fluids  Essential hypertension -Lisinopril  Elevated D-dimer -Likely from underlying comorbidities.  CTA angio negative for PE -Lower extremity Dopplers-negative for DVT  Marginal zone lymphoma MGUS -Follow-up outpatient oncology  DVT prophylaxis: Lovenox Code Status: Full Family Communication: None   Dispo: The patient is from: Home              Anticipated d/c is to: Home              Anticipated d/c date is: 1 day              Patient currently is not medically stable to d/c.  Patient is still having lower abdominal pain without adequate bowel movement.  She has severe constipation.  Due to abdominal discomfort her p.o. intake is poor therefore getting IV fluids as well. She is not ready for discharge today.   Subjective: Still having some abdominal discomfort with mild feeling of nausea.  But slightly better than yesterday.   No vomiting  Review of Systems Otherwise negative except as per HPI, including: General: Denies fever, chills, night sweats or unintended weight loss. Resp: Denies cough, wheezing, shortness of breath. Cardiac: Denies chest pain, palpitations, orthopnea, paroxysmal nocturnal dyspnea. GI: Denies vomiting, diarrhea or constipation GU: Denies dysuria, frequency, hesitancy or incontinence MS: Denies muscle aches, joint pain or swelling Neuro: Denies headache, neurologic deficits (focal weakness, numbness, tingling), abnormal gait Psych: Denies anxiety, depression, SI/HI/AVH Skin: Denies new rashes or lesions ID: Denies sick contacts, exotic exposures, travel  Examination:  Constitutional: Elderly frail, dry mouth, bilateral temporal wasting Respiratory: Clear to auscultation bilaterally Cardiovascular: Normal sinus rhythm, no rubs Abdomen: Nontender nondistended good bowel sounds Musculoskeletal: No edema noted Skin: No rashes seen Neurologic: CN 2-12 grossly intact.  And nonfocal Psychiatric: Normal judgment and insight. Alert and oriented x 3. Normal mood.  Objective: Vitals:   03/27/20 1603 03/27/20 2133 03/28/20 0558 03/28/20 1419  BP: (!) 131/51 128/71 (!) 175/84 118/73  Pulse: 60 64 65 69  Resp: 16 17 15 16   Temp: 98.2 F (36.8 C) 98 F (36.7 C) 99.1 F (37.3 C) (!) 97.5 F (36.4 C)  TempSrc: Oral Oral Oral Oral  SpO2: 96% 97% 98% 100%  Weight:        Intake/Output Summary (Last 24 hours) at 03/28/2020 1535 Last data filed at 03/28/2020 1400 Gross per 24 hour  Intake 693.43 ml  Output 902 ml  Net -208.57 ml   Filed Weights   03/26/20 1732  Weight: 70.8 kg  Data Reviewed:   CBC: Recent Labs  Lab 03/26/20 1739 03/27/20 0546 03/28/20 1003  WBC 2.6* 2.5* 3.0*  NEUTROABS 1.2* 1.3*  --   HGB 9.9* 10.4* 11.2*  HCT 31.7* 32.8* 35.0*  MCV 91.9 90.9 90.2  PLT 132* 112* [id number]   Basic Metabolic Panel: Recent Labs  Lab 03/26/20 1739 03/27/20 0546  03/28/20 1003  NA 138 137 136  K 4.6 4.7 4.6  CL 102 104 104  CO2 24 25 21*  GLUCOSE 150* 118* 103*  BUN 32* 27* 20  CREATININE 1.55* 1.45* 1.38*  CALCIUM 9.9 9.9 9.7  MG  --  1.9 1.9   GFR: Estimated Creatinine Clearance: 26 mL/min (A) (by C-G formula based on SCr of 1.38 mg/dL (H)). Liver Function Tests: Recent Labs  Lab 03/26/20 1739 03/27/20 0546 03/28/20 1003  AST 19 19 23   ALT 13 12 10   ALKPHOS 48 53 53  BILITOT 0.2* 0.7 0.7  PROT 6.7 6.6 6.6  ALBUMIN 3.7 3.4* 3.4*   Recent Labs  Lab 03/27/20 0546  LIPASE 47   No results for input(s): AMMONIA in the last 168 hours. Coagulation Profile: No results for input(s): INR, PROTIME in the last 168 hours. Cardiac Enzymes: No results for input(s): CKTOTAL, CKMB, CKMBINDEX, TROPONINI in the last 168 hours. BNP (last 3 results) No results for input(s): PROBNP in the last 8760 hours. HbA1C: No results for input(s): HGBA1C in the last 72 hours. CBG: Recent Labs  Lab 03/28/20 1202  GLUCAP 104*   Lipid Profile: No results for input(s): CHOL, HDL, LDLCALC, TRIG, CHOLHDL, LDLDIRECT in the last 72 hours. Thyroid Function Tests: Recent Labs    03/27/20 1034  TSH 2.155   Anemia Panel: No results for input(s): VITAMINB12, FOLATE, FERRITIN, TIBC, IRON, RETICCTPCT in the last 72 hours. Sepsis Labs: No results for input(s): PROCALCITON, LATICACIDVEN in the last 168 hours.  Recent Results (from the past 240 hour(s))  SARS Coronavirus 2 by RT PCR (hospital order, performed in [facility] hospital lab) Nasopharyngeal Nasopharyngeal Swab     Status: None   Collection Time: 03/26/20 11:12 PM   Specimen: Nasopharyngeal Swab  Result Value Ref Range Status   SARS Coronavirus 2 NEGATIVE NEGATIVE Final    Comment: (NOTE) SARS-CoV-2 target nucleic acids are NOT DETECTED. The SARS-CoV-2 RNA is generally detectable in upper and lower respiratory specimens during the acute phase of infection. The lowest concentration of SARS-CoV-2  viral copies this assay can detect is 250 copies / mL. A negative result does not preclude SARS-CoV-2 infection and should not be used as the sole basis for treatment or other patient management decisions.  A negative result may occur with improper specimen collection / handling, submission of specimen other than nasopharyngeal swab, presence of viral mutation(s) within the areas targeted by this assay, and inadequate number of viral copies (<250 copies / mL). A negative result must be combined with clinical observations, patient history, and epidemiological information. Fact Sheet for Patients:   StrictlyIdeas.no Fact Sheet for Healthcare Providers: BankingDealers.co.za This test is not yet approved or cleared  by the [place] FDA and has been authorized for detection and/or diagnosis of SARS-CoV-2 by FDA under an Emergency Use Authorization (EUA).  This EUA will remain in effect (meaning this test can be used) for the duration of the COVID-19 declaration under Section 564(b)(1) of the Act, 21 U.S.C. section 360bbb-3(b)(1), unless the authorization is terminated or revoked sooner. Performed at [facility], [place] [address] [place], [place]  [id number]          Radiology Studies: DG Abd 1 View  Result Date: 03/28/2020 CLINICAL DATA:  Abdominal distention. EXAM: ABDOMEN - 1 VIEW COMPARISON:  CT 03/26/2020. FINDINGS: Surgical clips right upper quadrant. No bowel distention. Large amount of stool noted throughout the colon, particularly in the rectum. Fecal impaction cannot be excluded. No free air noted. Degenerative changes and scoliosis lumbar spine. Postsurgical changes left hip. Aortoiliac atherosclerotic vascular calcification. IMPRESSION: Large amount of stool in the colon, particularly in the rectum. Fecal impaction cannot be excluded. No bowel distention. No acute abnormality identified. Electronically Signed   By: Marcello Moores   Register   On: 03/28/2020 09:23   CT Angio Chest PE W and/or Wo Contrast  Result Date: 03/26/2020 CLINICAL DATA:  Shortness of breath. EXAM: CT ANGIOGRAPHY CHEST WITH CONTRAST TECHNIQUE: Multidetector CT imaging of the chest was performed using the standard protocol during bolus administration of intravenous contrast. Multiplanar CT image reconstructions and MIPs were obtained to evaluate the vascular anatomy. CONTRAST:  7mL OMNIPAQUE IOHEXOL 350 MG/ML SOLN COMPARISON:  February 13, 2016 FINDINGS: Cardiovascular: There is marked severity calcification of the thoracic aorta. Satisfactory opacification of the pulmonary arteries to the segmental level. No evidence of pulmonary embolism. Normal heart size. No pericardial effusion. Mediastinum/Nodes: No enlarged mediastinal, hilar, or axillary lymph nodes. Thyroid gland, trachea, and esophagus demonstrate no significant findings. Lungs/Pleura: Mild to moderate severity right basilar atelectasis is seen with mild atelectasis noted within the left lung base. There is marked severity elevation of the right hemidiaphragm. This is seen on the prior study. There is no evidence of a pleural effusion or pneumothorax. Upper Abdomen: There is a small hiatal hernia. Multiple surgical clips are seen within the gallbladder fossa. Musculoskeletal: Multilevel degenerative changes are seen throughout the thoracic spine. Review of the MIP images confirms the above findings. IMPRESSION: 1. No evidence of pulmonary embolism. 2. Mild to moderate severity right basilar atelectasis with mild left basilar atelectasis. 3. Marked severity elevation of the right hemidiaphragm. 4. Small hiatal hernia. Aortic Atherosclerosis (ICD10-I70.0). Electronically Signed   By: Virgina Norfolk M.D.   On: 03/26/2020 23:09   CT ABDOMEN PELVIS W CONTRAST  Result Date: 03/26/2020 CLINICAL DATA:  Shortness of breath. EXAM: CT ABDOMEN AND PELVIS WITH CONTRAST TECHNIQUE: Multidetector CT imaging of the  abdomen and pelvis was performed using the standard protocol following bolus administration of intravenous contrast. CONTRAST:  57mL OMNIPAQUE IOHEXOL 350 MG/ML SOLN COMPARISON:  April 15, 2018 FINDINGS: Lower chest: Mild to moderate severity atelectasis is seen within the bilateral lung bases, right greater than left. There is stable marked severity elevation of the right hemidiaphragm. Hepatobiliary: No focal liver abnormality is seen. Status post cholecystectomy. No biliary dilatation. Pancreas: Unremarkable. No pancreatic ductal dilatation or surrounding inflammatory changes. Spleen: Normal in size without focal abnormality. Adrenals/Urinary Tract: The right adrenal gland is not clearly identified. The left adrenal gland is unremarkable. The kidneys are mildly decreased in size, without renal calculi, focal lesions, or hydronephrosis. Bladder is unremarkable. Stomach/Bowel: Stomach is within normal limits. The appendix is not clearly identified. Surgically anastomosed bowel is seen within the expected region of the mid sigmoid colon. A very large amount of stool is seen within a distended distal sigmoid colon and rectum. The remaining loops of large and small bowel are normal in caliber. Vascular/Lymphatic: There is marked severity calcification of the abdominal aorta. No enlarged abdominal or pelvic lymph nodes. Reproductive: The uterus is small in size and contains several subcentimeter  coarse parenchymal calcifications. The bilateral adnexa are unremarkable. Other: No abdominal wall hernia or abnormality. No abdominopelvic ascites. Musculoskeletal: A metallic density intramedullary rod and compression screw device are seen within the proximal left femur. Multilevel marked severity degenerative changes are noted throughout the lumbar spine. IMPRESSION: 1. Very large amount of stool within a distended distal sigmoid colon and rectum. 2. Mild to moderate severity atelectasis within the bilateral lung bases, right  greater than left. 3. Stable marked severity elevation of the right hemidiaphragm. 4. Evidence of prior cholecystectomy. 5. Prior open reduction internal fixation of the proximal left femur. Aortic Atherosclerosis (ICD10-I70.0). Electronically Signed   By: Virgina Norfolk M.D.   On: 03/26/2020 23:14   DG Chest Portable 1 View  Result Date: 03/26/2020 CLINICAL DATA:  Weakness, short of breath, lower extremity edema EXAM: PORTABLE CHEST 1 VIEW COMPARISON:  06/28/2019 FINDINGS: Single frontal view of the chest demonstrates a stable cardiac silhouette. There is chronic elevation of the right hemidiaphragm, with new right basilar consolidation. Left chest is clear. No pneumothorax. IMPRESSION: 1. Right basilar consolidation, which could reflect atelectasis or airspace disease. 2. Chronic elevation right hemidiaphragm. Electronically Signed   By: Randa Ngo M.D.   On: 03/26/2020 17:45   DG Abd 2 Views  Result Date: 03/26/2020 CLINICAL DATA:  Abdominal pain, lower extremity edema EXAM: ABDOMEN - 2 VIEW COMPARISON:  04/15/2018 FINDINGS: 2 supine frontal views of the abdomen and pelvis demonstrate an unremarkable bowel gas pattern. Significant retained stool within the rectal vault consistent with fecal impaction. There are no masses or abnormal calcifications. There is chronic elevation of the right hemidiaphragm. No masses or abnormal calcifications. IMPRESSION: 1. Significant retained stool within the rectal vault which could reflect fecal impaction. 2. No evidence of bowel obstruction or ileus. Electronically Signed   By: Randa Ngo M.D.   On: 03/26/2020 21:15   ECHOCARDIOGRAM COMPLETE  Result Date: 03/27/2020    ECHOCARDIOGRAM REPORT   Patient Name:   LEIGHLA MIHELICH Date of Exam: 03/27/2020 Medical Rec #:  [id number]     Height:       62.0 in Accession #:    [id number]    Weight:       156.0 lb Date of Birth:  [birth year]     BSA:          1.720 m Patient Age:    22 years      BP:           184/69 mmHg  Patient Gender: F             HR:           56 bpm. Exam Location:  Inpatient Procedure: 2D Echo, Cardiac Doppler and Color Doppler Indications:    CHF-Acute Diastolic [id number] / [id number]                 Aortic Stenosis 424.1 / 135.0  History:        Patient has prior history of Echocardiogram examinations, most                 recent 06/29/2019. CHF; Risk Factors:Hypertension, Diabetes and                 Former Smoker.  Sonographer:    Vickie Epley RDCS Referring Phys: [id number] [place]  1. Left ventricular ejection fraction, by estimation, is 60 to 65%. The left ventricle has normal function. The left ventricle has no regional wall motion abnormalities. There is  moderate concentric left ventricular hypertrophy. Left ventricular diastolic function could not be evaluated.  2. Right ventricular systolic function is normal. The right ventricular size is normal. There is normal pulmonary artery systolic pressure. The estimated right ventricular systolic pressure is 99991111 mmHg.  3. Left atrial size was mildly dilated.  4. The mitral valve is degenerative. No evidence of mitral valve regurgitation. No evidence of mitral stenosis.  5. The aortic valve is tricuspid. Aortic valve regurgitation is trivial. Moderate aortic valve stenosis. Aortic valve area, by VTI measures 1.06 cm. Aortic valve mean gradient measures 26.5 mmHg. Aortic valve Vmax measures 3.30 m/s.  6. The inferior vena cava is normal in size with <50% respiratory variability, suggesting right atrial pressure of 8 mmHg. Comparison(s): No significant change from prior study. LVEF is normal 60-65%. AS remains in moderate range. FINDINGS  Left Ventricle: Left ventricular ejection fraction, by estimation, is 60 to 65%. The left ventricle has normal function. The left ventricle has no regional wall motion abnormalities. The left ventricular internal cavity size was small. There is moderate  concentric left ventricular hypertrophy. Left ventricular  diastolic function could not be evaluated due to mitral annular calcification (moderate or greater). Left ventricular diastolic function could not be evaluated. Right Ventricle: The right ventricular size is normal. No increase in right ventricular wall thickness. Right ventricular systolic function is normal. There is normal pulmonary artery systolic pressure. The tricuspid regurgitant velocity is 2.08 m/s, and  with an assumed right atrial pressure of 8 mmHg, the estimated right ventricular systolic pressure is 99991111 mmHg. Left Atrium: Left atrial size was mildly dilated. Right Atrium: Right atrial size was normal in size. Pericardium: Trivial pericardial effusion is present. Presence of pericardial fat pad. Mitral Valve: The mitral valve is degenerative in appearance. Moderate to severe mitral annular calcification. No evidence of mitral valve regurgitation. No evidence of mitral valve stenosis. Tricuspid Valve: The tricuspid valve is grossly normal. Tricuspid valve regurgitation is trivial. No evidence of tricuspid stenosis. Aortic Valve: The aortic valve is tricuspid. . There is severe thickening and severe calcifcation of the aortic valve. Aortic valve regurgitation is trivial. Aortic regurgitation PHT measures 626 msec. Moderate aortic stenosis is present. There is severe  thickening of the aortic valve. There is severe calcifcation of the aortic valve. Aortic valve mean gradient measures 26.5 mmHg. Aortic valve peak gradient measures 43.4 mmHg. Aortic valve area, by VTI measures 1.06 cm. Pulmonic Valve: The pulmonic valve was grossly normal. Pulmonic valve regurgitation is not visualized. No evidence of pulmonic stenosis. Aorta: The aortic root and ascending aorta are structurally normal, with no evidence of dilitation. Venous: The inferior vena cava is normal in size with less than 50% respiratory variability, suggesting right atrial pressure of 8 mmHg. IAS/Shunts: The atrial septum is grossly normal.  LEFT  VENTRICLE PLAX 2D LVIDd:         3.41 cm     Diastology LVIDs:         2.53 cm     LV e' lateral:   4.12 cm/s LV PW:         1.52 cm     LV E/e' lateral: 16.4 LV IVS:        1.52 cm     LV e' medial:    2.98 cm/s LVOT diam:     2.00 cm     LV E/e' medial:  22.7 LV SV:         74 LV SV Index:   43 LVOT  Area:     3.14 cm  LV Volumes (MOD) LV vol d, MOD A2C: 80.3 ml LV vol d, MOD A4C: 87.3 ml LV vol s, MOD A2C: 26.7 ml LV vol s, MOD A4C: 33.8 ml LV SV MOD A2C:     53.6 ml LV SV MOD A4C:     87.3 ml LV SV MOD BP:      55.6 ml RIGHT VENTRICLE RV S prime:     12.40 cm/s TAPSE (M-mode): 1.9 cm LEFT ATRIUM             Index LA diam:        3.80 cm 2.21 cm/m LA Vol (A2C):   30.4 ml 17.67 ml/m LA Vol (A4C):   57.1 ml 33.20 ml/m LA Biplane Vol: 45.2 ml 26.28 ml/m  AORTIC VALVE AV Area (Vmax):    0.92 cm AV Area (Vmean):   0.91 cm AV Area (VTI):     1.06 cm AV Vmax:           329.50 cm/s AV Vmean:          238.000 cm/s AV VTI:            0.695 m AV Peak Grad:      43.4 mmHg AV Mean Grad:      26.5 mmHg LVOT Vmax:         96.10 cm/s LVOT Vmean:        68.900 cm/s LVOT VTI:          0.234 m LVOT/AV VTI ratio: 0.34 AI PHT:            626 msec  AORTA Ao Root diam: 3.40 cm Ao Asc diam:  4.00 cm MITRAL VALVE                TRICUSPID VALVE MV Area (PHT): 2.32 cm     TR Peak grad:   17.3 mmHg MV Decel Time: 327 msec     TR Vmax:        208.00 cm/s MV E velocity: 67.70 cm/s MV A velocity: 111.00 cm/s  SHUNTS MV E/A ratio:  0.61         Systemic VTI:  0.23 m                             Systemic Diam: 2.00 cm Eleonore Chiquito MD Electronically signed by Eleonore Chiquito MD Signature Date/Time: 03/27/2020/3:41:00 PM    Final    VAS [place] LOWER EXTREMITY VENOUS (DVT)  Result Date: 03/27/2020  Lower Venous DVTStudy Indications: Edema.  Comparison Study: 06/28/19 negative Performing Technologist: June Leap RDMS, RVT  Examination Guidelines: A complete evaluation includes B-mode imaging, spectral Doppler, color Doppler, and power Doppler  as needed of all accessible portions of each vessel. Bilateral testing is considered an integral part of a complete examination. Limited examinations for reoccurring indications may be performed as noted. The reflux portion of the exam is performed with the patient in reverse Trendelenburg.  +---------+---------------+---------+-----------+----------+--------------+ RIGHT    CompressibilityPhasicitySpontaneityPropertiesThrombus Aging +---------+---------------+---------+-----------+----------+--------------+ CFV      Full           Yes      Yes                                 +---------+---------------+---------+-----------+----------+--------------+ SFJ      Full                                                        +---------+---------------+---------+-----------+----------+--------------+  FV Prox  Full                                                        +---------+---------------+---------+-----------+----------+--------------+ FV Mid   Full                                                        +---------+---------------+---------+-----------+----------+--------------+ FV DistalFull                                                        +---------+---------------+---------+-----------+----------+--------------+ PFV      Full                                                        +---------+---------------+---------+-----------+----------+--------------+ POP      Full           Yes      Yes                                 +---------+---------------+---------+-----------+----------+--------------+ PTV      Full                                                        +---------+---------------+---------+-----------+----------+--------------+ PERO     Full                                                        +---------+---------------+---------+-----------+----------+--------------+    +---------+---------------+---------+-----------+----------+--------------+ LEFT     CompressibilityPhasicitySpontaneityPropertiesThrombus Aging +---------+---------------+---------+-----------+----------+--------------+ CFV      Full           Yes      Yes                                 +---------+---------------+---------+-----------+----------+--------------+ SFJ      Full                                                        +---------+---------------+---------+-----------+----------+--------------+ FV Prox  Full                                                        +---------+---------------+---------+-----------+----------+--------------+  FV Mid   Full                                                        +---------+---------------+---------+-----------+----------+--------------+ FV DistalFull                                                        +---------+---------------+---------+-----------+----------+--------------+ PFV      Full                                                        +---------+---------------+---------+-----------+----------+--------------+ POP      Full           Yes      Yes                                 +---------+---------------+---------+-----------+----------+--------------+ PTV      Full                                                        +---------+---------------+---------+-----------+----------+--------------+ PERO     Full                                                        +---------+---------------+---------+-----------+----------+--------------+     Summary: BILATERAL: - No evidence of deep vein thrombosis seen in the lower extremities, bilaterally. - RIGHT: - A cystic structure is found in the popliteal fossa.  LEFT: - No cystic structure found in the popliteal fossa.  *See table(s) above for measurements and observations. Electronically signed by Curt Jews MD on 03/27/2020 at 7:14:51 PM.     Final         Scheduled Meds: . enoxaparin (LOVENOX) injection  30 mg Subcutaneous Q24H  . lisinopril  10 mg Oral Daily  . loratadine  10 mg Oral Daily  . pantoprazole  40 mg Oral Daily  . polyethylene glycol  17 g Oral BID  . senna-docusate  1 tablet Oral BID   Continuous Infusions:   LOS: 0 days   Time spent= 35 mins    Tiara Maultsby Arsenio Loader, MD [facility]  If 7PM-7AM, please contact night-coverage  03/28/2020, 3:35 PM

## 2020-03-28 NOTE — Progress Notes (Signed)
PT Cancellation Note  Patient Details Name: Erika Day MRN: [id number] DOB: Feb 21, 1930   Cancelled Treatment:    Reason Eval/Treat Not Completed: Other (comment) Pt politely declining physical therapy services due to frequent bowel incontinence. Willing to participate tomorrow.    Wyona Almas, PT, DPT Acute Rehabilitation Services Pager [phone] Office [phone]    Deno Etienne 03/28/2020, 2:45 PM

## 2020-03-29 ENCOUNTER — Inpatient Hospital Stay (HOSPITAL_COMMUNITY): Payer: Medicare Other

## 2020-03-29 DIAGNOSIS — R7989 Other specified abnormal findings of blood chemistry: Secondary | ICD-10-CM

## 2020-03-29 LAB — COMPREHENSIVE METABOLIC PANEL
ALT: 12 U/L (ref 0–44)
AST: 21 U/L (ref 15–41)
Albumin: 3.2 g/dL — ABNORMAL LOW (ref 3.5–5.0)
Alkaline Phosphatase: 53 U/L (ref 38–126)
Anion gap: 7 (ref 5–15)
BUN: 19 mg/dL (ref 8–23)
CO2: 26 mmol/L (ref 22–32)
Calcium: 9.6 mg/dL (ref 8.9–10.3)
Chloride: 105 mmol/L (ref 98–111)
Creatinine, Ser: 1.27 mg/dL — ABNORMAL HIGH (ref 0.44–1.00)
GFR calc Af Amer: 44 mL/min — ABNORMAL LOW (ref >60)
GFR calc non Af Amer: 38 mL/min — ABNORMAL LOW (ref >60)
Glucose, Bld: 95 mg/dL (ref 70–99)
Potassium: 5 mmol/L (ref 3.5–5.1)
Sodium: 138 mmol/L (ref 135–145)
Total Bilirubin: 0.5 mg/dL (ref 0.3–1.2)
Total Protein: 6.6 g/dL (ref 6.5–8.1)

## 2020-03-29 LAB — CBC
HCT: 33.4 % — ABNORMAL LOW (ref 36.0–46.0)
Hemoglobin: 10.6 g/dL — ABNORMAL LOW (ref 12.0–15.0)
MCH: 28.7 pg (ref 26.0–34.0)
MCHC: 31.7 g/dL (ref 30.0–36.0)
MCV: 90.5 fL (ref 80.0–100.0)
Platelets: 149 10*3/uL — ABNORMAL LOW (ref 150–400)
RBC: 3.69 MIL/uL — ABNORMAL LOW (ref 3.87–5.11)
RDW: 12.3 % (ref 11.5–15.5)
WBC: 3.5 10*3/uL — ABNORMAL LOW (ref 4.0–10.5)
nRBC: 0 % (ref 0.0–0.2)

## 2020-03-29 LAB — GLUCOSE, CAPILLARY
Glucose-Capillary: 73 mg/dL (ref 70–99)
Glucose-Capillary: 108 mg/dL — ABNORMAL HIGH (ref 70–99)
Glucose-Capillary: 141 mg/dL — ABNORMAL HIGH (ref 70–99)
Glucose-Capillary: 139 mg/dL — ABNORMAL HIGH (ref 70–99)

## 2020-03-29 LAB — MAGNESIUM: Magnesium: 2.1 mg/dL (ref 1.7–2.4)

## 2020-03-29 MED ORDER — SALINE SPRAY 0.65 % NA SOLN
1.0000 | NASAL | Status: DC | PRN
  Administered 2020-03-29: 1 via NASAL
  Filled 2020-03-29: qty 44

## 2020-03-29 MED ORDER — HYOSCYAMINE SULFATE 0.125 MG SL SUBL
0.2500 mg | SUBLINGUAL_TABLET | Freq: Four times a day (QID) | SUBLINGUAL | Status: AC | PRN
  Administered 2020-03-30 (×2): 0.25 mg via ORAL
  Filled 2020-03-29 (×3): qty 2

## 2020-03-29 NOTE — Progress Notes (Signed)
Physical Therapy Treatment Patient Details Name: Erika Day MRN: [id number] DOB: August 06, 1930 Today's Date: 03/29/2020    History of Present Illness Pt is an 86 y/o female admitted secondary to abdominal pain from obstipation. PMH includes DM, CVA, CLL, CKD, dCHF, HTN, and lymphoma.     PT Comments    Patient progressing slowly towards PT goals. Agreeable to bed mobility, sitting EOB and there ex today. Pt declining OOB to chair due to frequency of loose stools without warning. Also plan for enema soon. Performing bed mobility at [facility] guard level with heavy use of rails. Pt reports she has support at home. Will follow for mobility progression as pt able/willing.    Follow Up Recommendations  Home health PT;Supervision/Assistance - 24 hour     Equipment Recommendations  None recommended by PT    Recommendations for Other Services       Precautions / Restrictions Precautions Precautions: Fall Restrictions Weight Bearing Restrictions: No    Mobility  Bed Mobility Overal bed mobility: Needs Assistance Bed Mobility: Rolling;Supine to Sit;Sit to Supine Rolling: Supervision   Supine to sit: Min guard;HOB elevated Sit to supine: Min guard;HOB elevated   General bed mobility comments: Rolling to right/left for pericare; min guard and increased time for bed mobility with use of rails.  Transfers                 General transfer comment: Declined transfer OOB to chair today due to frequent runny stools without warning.  Ambulation/Gait                 Stairs             Wheelchair Mobility    Modified Rankin (Stroke Patients Only)       Balance Overall balance assessment: Needs assistance Sitting-balance support: Feet supported;No upper extremity supported Sitting balance-Leahy Scale: Fair                                      Cognition Arousal/Alertness: Awake/alert Behavior During Therapy: WFL for tasks  assessed/performed Overall Cognitive Status: Within Functional Limits for tasks assessed                                        Exercises General Exercises - Lower Extremity Ankle Circles/Pumps: AROM;Both;10 reps;Supine Quad Sets: AROM;Both;5 reps;Supine Long Arc Quad: AROM;Both;10 reps;Seated    General Comments General comments (skin integrity, edema, etc.): Incontinent of stool during mobility; performed pericare and returned to supine. Awaiting enema.      Pertinent Vitals/Pain Pain Assessment: Faces Faces Pain Scale: Hurts little more Pain Location: lower chest/abdomen. "gas pains" Pain Descriptors / Indicators: Aching;Guarding;Grimacing Pain Intervention(s): Repositioned;Monitored during session;Limited activity within patient's tolerance    Home Living                      Prior Function            PT Goals (current goals can now be found in the care plan section) Progress towards PT goals: Not progressing toward goals - comment(due to bowel incontinence)    Frequency    Min 3X/week      PT Plan Current plan remains appropriate    Co-evaluation              AM-PAC PT "6  Clicks" Mobility   Outcome Measure  Help needed turning from your back to your side while in a flat bed without using bedrails?: None Help needed moving from lying on your back to sitting on the side of a flat bed without using bedrails?: A Little Help needed moving to and from a bed to a chair (including a wheelchair)?: A Lot Help needed standing up from a chair using your arms (e.g., wheelchair or bedside chair)?: A Lot Help needed to walk in hospital room?: Total Help needed climbing 3-5 steps with a railing? : Total 6 Click Score: 13    End of Session   Activity Tolerance: Other (comment)(limited as pt had enema and having stools) Patient left: in bed;with call bell/phone within reach;with bed alarm set Nurse Communication: Mobility status PT Visit  Diagnosis: Other abnormalities of gait and mobility (R26.89);Difficulty in walking, not elsewhere classified (R26.2);Pain Pain - part of body: (abdomen)     Time: [id number] PT Time Calculation (min) (ACUTE ONLY): 20 min  Charges:  $Therapeutic Activity: 8-22 mins                     Marisa Severin, PT, DPT Acute Rehabilitation Services Pager [phone] Office [phone]       Marguarite Arbour A Sabra Heck 03/29/2020, 1:12 PM

## 2020-03-29 NOTE — Progress Notes (Signed)
PROGRESS NOTE    Erika Day  [id number] DOB: [birth year] DOA: 03/26/2020 PCP: Mack Hook, MD   Brief Narrative:  86 year old with history of GERD, marginal zone lymphoma follows with Dr. Alvy Bimler, MGUS, CKD stage III, chronic constipation, diastolic CHF, HTN, sigmoid volvulus status post colectomy in 2017 presents with abdominal pain.  Found to have severe constipation started on aggressive bowel regimen.   Assessment & Plan:   Principal Problem:   Abdominal pain Active Problems:   Essential hypertension   Marginal zone lymphoma of axilla (HCC)   Chronic constipation   Pancytopenia (HCC)   MGUS (monoclonal gammopathy of unknown significance)   Elevated d-dimer   Chronic kidney disease, stage 3b   Uncontrolled type 2 diabetes mellitus with hyperglycemia (HCC)   Pressure injury of skin  Abdominal pain secondary to severe abdominal constipation -Still having some watery stool and some rectal discomfort.  But much better than yesterday.  X-ray still shows quite a bit of rectal area of impaction.  Will order another enema today.  Continue aggressive bowel regimen. -IV fluids  Essential hypertension -Lisinopril  Elevated D-dimer -Likely from underlying comorbidities.  CTA angio negative for PE -Lower extremity Dopplers-negative for DVT  Marginal zone lymphoma MGUS -Follow-up outpatient oncology  DVT prophylaxis: Lovenox Code Status: Full Family Communication: Daughter and husband at bedside   Dispo: The patient is from: Home              Anticipated d/c is to: Home              Anticipated d/c date is: 1 day              Patient currently is not medically stable to d/c.  Continues to do better, given her age I would like to keep her in the hospital for at least another day for aggressive bowel regimen until she has more relief.   Subjective: Feels little better compared to yesterday.  Had small bowel movement which was mostly watery this morning.  Still  having some rectal discomfort mostly because of rectal fullness.  Review of Systems Otherwise negative except as per HPI, including: General: Denies fever, chills, night sweats or unintended weight loss. Resp: Denies cough, wheezing, shortness of breath. Cardiac: Denies chest pain, palpitations, orthopnea, paroxysmal nocturnal dyspnea. GI: Denies abdominal pain, nausea, vomiting, diarrhea or constipation GU: Denies dysuria, frequency, hesitancy or incontinence MS: Denies muscle aches, joint pain or swelling Neuro: Denies headache, neurologic deficits (focal weakness, numbness, tingling), abnormal gait Psych: Denies anxiety, depression, SI/HI/AVH Skin: Denies new rashes or lesions ID: Denies sick contacts, exotic exposures, travel  Examination:  Constitutional: Elderly frail with bilateral temporal wasting, very pleasant Respiratory: Clear to auscultation bilaterally Cardiovascular: Normal sinus rhythm, no rubs Abdomen: Nontender nondistended good bowel sounds Musculoskeletal: No edema noted Skin: No rashes seen Neurologic: CN 2-12 grossly intact.  And nonfocal Psychiatric: Normal judgment and insight. Alert and oriented x 3. Normal mood.  Objective: Vitals:   03/28/20 0558 03/28/20 1419 03/28/20 2045 03/29/20 0500  BP: (!) 175/84 118/73 (!) 169/85 137/79  Pulse: 65 69 76 65  Resp: 15 16 16 16   Temp: 99.1 F (37.3 C) (!) 97.5 F (36.4 C) 98.5 F (36.9 C) 99.1 F (37.3 C)  TempSrc: Oral Oral Oral Oral  SpO2: 98% 100% 97% 98%  Weight:        Intake/Output Summary (Last 24 hours) at 03/29/2020 1224 Last data filed at 03/29/2020 0930 Gross per 24 hour  Intake 1538.66 ml  Output 201 ml  Net 1337.66 ml   Filed Weights   03/26/20 1732  Weight: 70.8 kg     Data Reviewed:   CBC: Recent Labs  Lab 03/26/20 1739 03/27/20 0546 03/28/20 1003 03/29/20 0246  WBC 2.6* 2.5* 3.0* 3.5*  NEUTROABS 1.2* 1.3*  --   --   HGB 9.9* 10.4* 11.2* 10.6*  HCT 31.7* 32.8* 35.0* 33.4*   MCV 91.9 90.9 90.2 90.5  PLT 132* 112* 156 [id number]*   Basic Metabolic Panel: Recent Labs  Lab 03/26/20 1739 03/27/20 0546 03/28/20 1003 03/29/20 0246  NA 138 137 136 138  K 4.6 4.7 4.6 5.0  CL 102 104 104 105  CO2 24 25 21* 26  GLUCOSE 150* 118* 103* 95  BUN 32* 27* 20 19  CREATININE 1.55* 1.45* 1.38* 1.27*  CALCIUM 9.9 9.9 9.7 9.6  MG  --  1.9 1.9 2.1   GFR: Estimated Creatinine Clearance: 28.2 mL/min (A) (by C-G formula based on SCr of 1.27 mg/dL (H)). Liver Function Tests: Recent Labs  Lab 03/26/20 1739 03/27/20 0546 03/28/20 1003 03/29/20 0246  AST 19 19 23 21   ALT 13 12 10 12   ALKPHOS 48 53 53 53  BILITOT 0.2* 0.7 0.7 0.5  PROT 6.7 6.6 6.6 6.6  ALBUMIN 3.7 3.4* 3.4* 3.2*   Recent Labs  Lab 03/27/20 0546  LIPASE 47   No results for input(s): AMMONIA in the last 168 hours. Coagulation Profile: No results for input(s): INR, PROTIME in the last 168 hours. Cardiac Enzymes: No results for input(s): CKTOTAL, CKMB, CKMBINDEX, TROPONINI in the last 168 hours. BNP (last 3 results) No results for input(s): PROBNP in the last 8760 hours. HbA1C: No results for input(s): HGBA1C in the last 72 hours. CBG: Recent Labs  Lab 03/28/20 1728 03/28/20 2202 03/28/20 2214 03/29/20 0731 03/29/20 1149  GLUCAP 88 74 81 73 108*   Lipid Profile: No results for input(s): CHOL, HDL, LDLCALC, TRIG, CHOLHDL, LDLDIRECT in the last 72 hours. Thyroid Function Tests: Recent Labs    03/27/20 1034  TSH 2.155   Anemia Panel: No results for input(s): VITAMINB12, FOLATE, FERRITIN, TIBC, IRON, RETICCTPCT in the last 72 hours. Sepsis Labs: No results for input(s): PROCALCITON, LATICACIDVEN in the last 168 hours.  Recent Results (from the past 240 hour(s))  SARS Coronavirus 2 by RT PCR (hospital order, performed in [facility] hospital lab) Nasopharyngeal Nasopharyngeal Swab     Status: None   Collection Time: 03/26/20 11:12 PM   Specimen: Nasopharyngeal Swab  Result Value Ref  Range Status   SARS Coronavirus 2 NEGATIVE NEGATIVE Final    Comment: (NOTE) SARS-CoV-2 target nucleic acids are NOT DETECTED. The SARS-CoV-2 RNA is generally detectable in upper and lower respiratory specimens during the acute phase of infection. The lowest concentration of SARS-CoV-2 viral copies this assay can detect is 250 copies / mL. A negative result does not preclude SARS-CoV-2 infection and should not be used as the sole basis for treatment or other patient management decisions.  A negative result may occur with improper specimen collection / handling, submission of specimen other than nasopharyngeal swab, presence of viral mutation(s) within the areas targeted by this assay, and inadequate number of viral copies (<250 copies / mL). A negative result must be combined with clinical observations, patient history, and epidemiological information. Fact Sheet for Patients:   StrictlyIdeas.no Fact Sheet for Healthcare Providers: BankingDealers.co.za This test is not yet approved or cleared  by the [place] FDA and has been authorized  for detection and/or diagnosis of SARS-CoV-2 by FDA under an Emergency Use Authorization (EUA).  This EUA will remain in effect (meaning this test can be used) for the duration of the COVID-19 declaration under Section 564(b)(1) of the Act, 21 U.S.C. section 360bbb-3(b)(1), unless the authorization is terminated or revoked sooner. Performed at [facility], [place] [address] [place], [place] [address]          Radiology Studies: DG Abd 1 View  Result Date: 03/29/2020 CLINICAL DATA:  Abdominal distension. EXAM: ABDOMEN - 1 VIEW COMPARISON:  Mar 28, 2020. FINDINGS: No abnormal bowel dilatation is noted. Large amount of stool is noted in the rectum. No radio-opaque calculi or other significant radiographic abnormality are seen. IMPRESSION: Large amount of stool seen in the rectum concerning for  fecal impaction. Electronically Signed   By: Marijo Conception M.D.   On: 03/29/2020 08:19   DG Abd 1 View  Result Date: 03/28/2020 CLINICAL DATA:  Abdominal distention. EXAM: ABDOMEN - 1 VIEW COMPARISON:  CT 03/26/2020. FINDINGS: Surgical clips right upper quadrant. No bowel distention. Large amount of stool noted throughout the colon, particularly in the rectum. Fecal impaction cannot be excluded. No free air noted. Degenerative changes and scoliosis lumbar spine. Postsurgical changes left hip. Aortoiliac atherosclerotic vascular calcification. IMPRESSION: Large amount of stool in the colon, particularly in the rectum. Fecal impaction cannot be excluded. No bowel distention. No acute abnormality identified. Electronically Signed   By: Marcello Moores  Register   On: 03/28/2020 09:23   ECHOCARDIOGRAM COMPLETE  Result Date: 03/27/2020    ECHOCARDIOGRAM REPORT   Patient Name:   JADALYNN BIXEL Date of Exam: 03/27/2020 Medical Rec #:  [id number]     Height:       62.0 in Accession #:    [id number]    Weight:       156.0 lb Date of Birth:  Jun 06, 1930     BSA:          1.720 m Patient Age:    72 years      BP:           184/69 mmHg Patient Gender: F             HR:           56 bpm. Exam Location:  Inpatient Procedure: 2D Echo, Cardiac Doppler and Color Doppler Indications:    CHF-Acute Diastolic [id number] / [id number]                 Aortic Stenosis 424.1 / 135.0  History:        Patient has prior history of Echocardiogram examinations, most                 recent 06/29/2019. CHF; Risk Factors:Hypertension, Diabetes and                 Former Smoker.  Sonographer:    Vickie Epley RDCS Referring Phys: [id number] [place]  1. Left ventricular ejection fraction, by estimation, is 60 to 65%. The left ventricle has normal function. The left ventricle has no regional wall motion abnormalities. There is moderate concentric left ventricular hypertrophy. Left ventricular diastolic function could not be evaluated.  2. Right  ventricular systolic function is normal. The right ventricular size is normal. There is normal pulmonary artery systolic pressure. The estimated right ventricular systolic pressure is 99991111 mmHg.  3. Left atrial size was mildly dilated.  4. The mitral valve is degenerative. No evidence  of mitral valve regurgitation. No evidence of mitral stenosis.  5. The aortic valve is tricuspid. Aortic valve regurgitation is trivial. Moderate aortic valve stenosis. Aortic valve area, by VTI measures 1.06 cm. Aortic valve mean gradient measures 26.5 mmHg. Aortic valve Vmax measures 3.30 m/s.  6. The inferior vena cava is normal in size with <50% respiratory variability, suggesting right atrial pressure of 8 mmHg. Comparison(s): No significant change from prior study. LVEF is normal 60-65%. AS remains in moderate range. FINDINGS  Left Ventricle: Left ventricular ejection fraction, by estimation, is 60 to 65%. The left ventricle has normal function. The left ventricle has no regional wall motion abnormalities. The left ventricular internal cavity size was small. There is moderate  concentric left ventricular hypertrophy. Left ventricular diastolic function could not be evaluated due to mitral annular calcification (moderate or greater). Left ventricular diastolic function could not be evaluated. Right Ventricle: The right ventricular size is normal. No increase in right ventricular wall thickness. Right ventricular systolic function is normal. There is normal pulmonary artery systolic pressure. The tricuspid regurgitant velocity is 2.08 m/s, and  with an assumed right atrial pressure of 8 mmHg, the estimated right ventricular systolic pressure is 99991111 mmHg. Left Atrium: Left atrial size was mildly dilated. Right Atrium: Right atrial size was normal in size. Pericardium: Trivial pericardial effusion is present. Presence of pericardial fat pad. Mitral Valve: The mitral valve is degenerative in appearance. Moderate to severe mitral  annular calcification. No evidence of mitral valve regurgitation. No evidence of mitral valve stenosis. Tricuspid Valve: The tricuspid valve is grossly normal. Tricuspid valve regurgitation is trivial. No evidence of tricuspid stenosis. Aortic Valve: The aortic valve is tricuspid. . There is severe thickening and severe calcifcation of the aortic valve. Aortic valve regurgitation is trivial. Aortic regurgitation PHT measures 626 msec. Moderate aortic stenosis is present. There is severe  thickening of the aortic valve. There is severe calcifcation of the aortic valve. Aortic valve mean gradient measures 26.5 mmHg. Aortic valve peak gradient measures 43.4 mmHg. Aortic valve area, by VTI measures 1.06 cm. Pulmonic Valve: The pulmonic valve was grossly normal. Pulmonic valve regurgitation is not visualized. No evidence of pulmonic stenosis. Aorta: The aortic root and ascending aorta are structurally normal, with no evidence of dilitation. Venous: The inferior vena cava is normal in size with less than 50% respiratory variability, suggesting right atrial pressure of 8 mmHg. IAS/Shunts: The atrial septum is grossly normal.  LEFT VENTRICLE PLAX 2D LVIDd:         3.41 cm     Diastology LVIDs:         2.53 cm     LV e' lateral:   4.12 cm/s LV PW:         1.52 cm     LV E/e' lateral: 16.4 LV IVS:        1.52 cm     LV e' medial:    2.98 cm/s LVOT diam:     2.00 cm     LV E/e' medial:  22.7 LV SV:         74 LV SV Index:   43 LVOT Area:     3.14 cm  LV Volumes (MOD) LV vol d, MOD A2C: 80.3 ml LV vol d, MOD A4C: 87.3 ml LV vol s, MOD A2C: 26.7 ml LV vol s, MOD A4C: 33.8 ml LV SV MOD A2C:     53.6 ml LV SV MOD A4C:     87.3 ml LV SV  MOD BP:      55.6 ml RIGHT VENTRICLE RV S prime:     12.40 cm/s TAPSE (M-mode): 1.9 cm LEFT ATRIUM             Index LA diam:        3.80 cm 2.21 cm/m LA Vol (A2C):   30.4 ml 17.67 ml/m LA Vol (A4C):   57.1 ml 33.20 ml/m LA Biplane Vol: 45.2 ml 26.28 ml/m  AORTIC VALVE AV Area (Vmax):    0.92  cm AV Area (Vmean):   0.91 cm AV Area (VTI):     1.06 cm AV Vmax:           329.50 cm/s AV Vmean:          238.000 cm/s AV VTI:            0.695 m AV Peak Grad:      43.4 mmHg AV Mean Grad:      26.5 mmHg LVOT Vmax:         96.10 cm/s LVOT Vmean:        68.900 cm/s LVOT VTI:          0.234 m LVOT/AV VTI ratio: 0.34 AI PHT:            626 msec  AORTA Ao Root diam: 3.40 cm Ao Asc diam:  4.00 cm MITRAL VALVE                TRICUSPID VALVE MV Area (PHT): 2.32 cm     TR Peak grad:   17.3 mmHg MV Decel Time: 327 msec     TR Vmax:        208.00 cm/s MV E velocity: 67.70 cm/s MV A velocity: 111.00 cm/s  SHUNTS MV E/A ratio:  0.61         Systemic VTI:  0.23 m                             Systemic Diam: 2.00 cm Eleonore Chiquito MD Electronically signed by Eleonore Chiquito MD Signature Date/Time: 03/27/2020/3:41:00 PM    Final    VAS [place] LOWER EXTREMITY VENOUS (DVT)  Result Date: 03/27/2020  Lower Venous DVTStudy Indications: Edema.  Comparison Study: 06/28/19 negative Performing Technologist: June Leap RDMS, RVT  Examination Guidelines: A complete evaluation includes B-mode imaging, spectral Doppler, color Doppler, and power Doppler as needed of all accessible portions of each vessel. Bilateral testing is considered an integral part of a complete examination. Limited examinations for reoccurring indications may be performed as noted. The reflux portion of the exam is performed with the patient in reverse Trendelenburg.  +---------+---------------+---------+-----------+----------+--------------+ RIGHT    CompressibilityPhasicitySpontaneityPropertiesThrombus Aging +---------+---------------+---------+-----------+----------+--------------+ CFV      Full           Yes      Yes                                 +---------+---------------+---------+-----------+----------+--------------+ SFJ      Full                                                         +---------+---------------+---------+-----------+----------+--------------+ FV Prox  Full                                                        +---------+---------------+---------+-----------+----------+--------------+  FV Mid   Full                                                        +---------+---------------+---------+-----------+----------+--------------+ FV DistalFull                                                        +---------+---------------+---------+-----------+----------+--------------+ PFV      Full                                                        +---------+---------------+---------+-----------+----------+--------------+ POP      Full           Yes      Yes                                 +---------+---------------+---------+-----------+----------+--------------+ PTV      Full                                                        +---------+---------------+---------+-----------+----------+--------------+ PERO     Full                                                        +---------+---------------+---------+-----------+----------+--------------+   +---------+---------------+---------+-----------+----------+--------------+ LEFT     CompressibilityPhasicitySpontaneityPropertiesThrombus Aging +---------+---------------+---------+-----------+----------+--------------+ CFV      Full           Yes      Yes                                 +---------+---------------+---------+-----------+----------+--------------+ SFJ      Full                                                        +---------+---------------+---------+-----------+----------+--------------+ FV Prox  Full                                                        +---------+---------------+---------+-----------+----------+--------------+ FV Mid   Full                                                         +---------+---------------+---------+-----------+----------+--------------+  FV DistalFull                                                        +---------+---------------+---------+-----------+----------+--------------+ PFV      Full                                                        +---------+---------------+---------+-----------+----------+--------------+ POP      Full           Yes      Yes                                 +---------+---------------+---------+-----------+----------+--------------+ PTV      Full                                                        +---------+---------------+---------+-----------+----------+--------------+ PERO     Full                                                        +---------+---------------+---------+-----------+----------+--------------+     Summary: BILATERAL: - No evidence of deep vein thrombosis seen in the lower extremities, bilaterally. - RIGHT: - A cystic structure is found in the popliteal fossa.  LEFT: - No cystic structure found in the popliteal fossa.  *See table(s) above for measurements and observations. Electronically signed by Curt Jews MD on 03/27/2020 at 7:14:51 PM.    Final         Scheduled Meds: . enoxaparin (LOVENOX) injection  30 mg Subcutaneous Q24H  . lisinopril  10 mg Oral Daily  . loratadine  10 mg Oral Daily  . pantoprazole  40 mg Oral Daily  . polyethylene glycol  17 g Oral BID  . senna-docusate  1 tablet Oral BID   Continuous Infusions: . sodium chloride 75 mL/hr at 03/29/20 0518     LOS: 1 day   Time spent= 35 mins    Amadeo Coke Arsenio Loader, MD [facility]  If 7PM-7AM, please contact night-coverage  03/29/2020, 12:24 PM

## 2020-03-30 ENCOUNTER — Inpatient Hospital Stay (HOSPITAL_COMMUNITY): Payer: Medicare Other

## 2020-03-30 LAB — COMPREHENSIVE METABOLIC PANEL
ALT: 12 U/L (ref 0–44)
AST: 18 U/L (ref 15–41)
Albumin: 3.1 g/dL — ABNORMAL LOW (ref 3.5–5.0)
Alkaline Phosphatase: 48 U/L (ref 38–126)
Anion gap: 8 (ref 5–15)
BUN: 15 mg/dL (ref 8–23)
CO2: 23 mmol/L (ref 22–32)
Calcium: 9.3 mg/dL (ref 8.9–10.3)
Chloride: 106 mmol/L (ref 98–111)
Creatinine, Ser: 1.18 mg/dL — ABNORMAL HIGH (ref 0.44–1.00)
GFR calc Af Amer: 48 mL/min — ABNORMAL LOW (ref >60)
GFR calc non Af Amer: 41 mL/min — ABNORMAL LOW (ref >60)
Glucose, Bld: 114 mg/dL — ABNORMAL HIGH (ref 70–99)
Potassium: 4.3 mmol/L (ref 3.5–5.1)
Sodium: 137 mmol/L (ref 135–145)
Total Bilirubin: 0.7 mg/dL (ref 0.3–1.2)
Total Protein: 6.2 g/dL — ABNORMAL LOW (ref 6.5–8.1)

## 2020-03-30 LAB — LIPASE, BLOOD: Lipase: 36 U/L (ref 11–51)

## 2020-03-30 LAB — GLUCOSE, CAPILLARY
Glucose-Capillary: 91 mg/dL (ref 70–99)
Glucose-Capillary: 109 mg/dL — ABNORMAL HIGH (ref 70–99)
Glucose-Capillary: 101 mg/dL — ABNORMAL HIGH (ref 70–99)
Glucose-Capillary: 135 mg/dL — ABNORMAL HIGH (ref 70–99)

## 2020-03-30 LAB — MAGNESIUM: Magnesium: 1.9 mg/dL (ref 1.7–2.4)

## 2020-03-30 LAB — CBC
HCT: 31.8 % — ABNORMAL LOW (ref 36.0–46.0)
Hemoglobin: 10.1 g/dL — ABNORMAL LOW (ref 12.0–15.0)
MCH: 28.4 pg (ref 26.0–34.0)
MCHC: 31.8 g/dL (ref 30.0–36.0)
MCV: 89.3 fL (ref 80.0–100.0)
Platelets: 137 10*3/uL — ABNORMAL LOW (ref 150–400)
RBC: 3.56 MIL/uL — ABNORMAL LOW (ref 3.87–5.11)
RDW: 12.2 % (ref 11.5–15.5)
WBC: 2.5 10*3/uL — ABNORMAL LOW (ref 4.0–10.5)
nRBC: 0 % (ref 0.0–0.2)

## 2020-03-30 MED ORDER — FENTANYL CITRATE (PF) 100 MCG/2ML IJ SOLN
25.0000 ug | Freq: Once | INTRAMUSCULAR | Status: AC
  Administered 2020-03-30: 25 ug via INTRAVENOUS
  Filled 2020-03-30: qty 2

## 2020-03-30 MED ORDER — GLYCERIN (LAXATIVE) 2.1 G RE SUPP
1.0000 | Freq: Every day | RECTAL | Status: DC | PRN
  Filled 2020-03-30: qty 1

## 2020-03-30 MED ORDER — BISACODYL 10 MG RE SUPP: 10.0000 mg | Freq: Once | RECTAL | Status: DC

## 2020-03-30 MED ORDER — DOCUSATE SODIUM 100 MG PO CAPS
100.0000 mg | ORAL_CAPSULE | Freq: Two times a day (BID) | ORAL | Status: DC
  Administered 2020-03-30 – 2020-03-31 (×2): 100 mg via ORAL
  Filled 2020-03-30 (×2): qty 1

## 2020-03-30 NOTE — Progress Notes (Signed)
Pt's BP at 0500 was 184/74, pt complains of abdominal pain at 10/10. Pain med given.  Notified MD on call. Rechecked BP, was down to 175/75. Notified MD on call.  Will continue to monitor pt.

## 2020-03-30 NOTE — Progress Notes (Signed)
PROGRESS NOTE    Erika Day  [id number] DOB: [birth year] DOA: 03/26/2020 PCP: Mack Hook, MD   Brief Narrative:  86 year old with history of GERD, marginal zone lymphoma follows with Dr. Alvy Bimler, MGUS, CKD stage III, chronic constipation, diastolic CHF, HTN, sigmoid volvulus status post colectomy in 2017 presents with abdominal pain.  Found to have severe constipation started on aggressive bowel regimen.   Assessment & Plan:   Principal Problem:   Abdominal pain Active Problems:   Essential hypertension   Marginal zone lymphoma of axilla (HCC)   Chronic constipation   Pancytopenia (HCC)   MGUS (monoclonal gammopathy of unknown significance)   Elevated d-dimer   Chronic kidney disease, stage 3b   Uncontrolled type 2 diabetes mellitus with hyperglycemia (HCC)   Pressure injury of skin  Abdominal pain secondary to severe abdominal constipation -Still having some rectal discomfort and abdominal discomfort.  Minimal bowel movement over last 24 hours.  Has some episode of nausea.  Continue with aggressive enemas. -IV fluids  Essential hypertension -Lisinopril  Elevated D-dimer -Likely from underlying comorbidities.  CTA angio negative for PE -Lower extremity Dopplers-negative for DVT  Marginal zone lymphoma MGUS -Follow-up outpatient oncology  DVT prophylaxis: Lovenox Code Status: Full Family Communication: Daughter and husband at bedside   Dispo: The patient is from: Home              Anticipated d/c is to: Home              Anticipated d/c date is: 1 day              Patient currently is not medically stable to d/c.  Still having quite a bit of constipation with some nausea.   Subjective: Had episodes of abdominal pain early this morning, unable to pass gas.  Minimal bowel movement over the last 24 hours.  Review of Systems Otherwise negative except as per HPI, including: General: Denies fever, chills, night sweats or unintended weight loss. Resp:  Denies cough, wheezing, shortness of breath. Cardiac: Denies chest pain, palpitations, orthopnea, paroxysmal nocturnal dyspnea. GI: Denies abdominal pain, nausea, vomiting, diarrhea or constipation GU: Denies dysuria, frequency, hesitancy or incontinence MS: Denies muscle aches, joint pain or swelling Neuro: Denies headache, neurologic deficits (focal weakness, numbness, tingling), abnormal gait Psych: Denies anxiety, depression, SI/HI/AVH Skin: Denies new rashes or lesions ID: Denies sick contacts, exotic exposures, travel  Examination:  Constitutional: Elderly frail with bilateral temporal wasting Respiratory: Clear to auscultation bilaterally Cardiovascular: Normal sinus rhythm, no rubs Abdomen: Nontender nondistended good bowel sounds Musculoskeletal: No edema noted Skin: No rashes seen Neurologic: CN 2-12 grossly intact.  And nonfocal Psychiatric: Normal judgment and insight. Alert and oriented x 3. Normal mood. Objective: Vitals:   03/29/20 2001 03/30/20 0435 03/30/20 0456 03/30/20 0558  BP: 136/74 (!) 187/82 (!) 184/74 (!) 175/75  Pulse: 75 71 77   Resp: 17 17 17    Temp: 97.9 F (36.6 C)  97.8 F (36.6 C)   TempSrc: Oral  Oral   SpO2: 97% 99% 100%   Weight:        Intake/Output Summary (Last 24 hours) at 03/30/2020 1047 Last data filed at 03/30/2020 0800 Gross per 24 hour  Intake 720 ml  Output 950 ml  Net -230 ml   Filed Weights   03/26/20 1732  Weight: 70.8 kg     Data Reviewed:   CBC: Recent Labs  Lab 03/26/20 1739 03/27/20 0546 03/28/20 1003 03/29/20 0246 03/30/20 0336  WBC 2.6* 2.5* 3.0*  3.5* 2.5*  NEUTROABS 1.2* 1.3*  --   --   --   HGB 9.9* 10.4* 11.2* 10.6* 10.1*  HCT 31.7* 32.8* 35.0* 33.4* 31.8*  MCV 91.9 90.9 90.2 90.5 89.3  PLT 132* 112* 156 149* 0000000*   Basic Metabolic Panel: Recent Labs  Lab 03/26/20 1739 03/27/20 0546 03/28/20 1003 03/29/20 0246 03/30/20 0336  NA 138 137 136 138 137  K 4.6 4.7 4.6 5.0 4.3  CL 102 104 104 105  106  CO2 24 25 21* 26 23  GLUCOSE 150* 118* 103* 95 114*  BUN 32* 27* 20 19 15   CREATININE 1.55* 1.45* 1.38* 1.27* 1.18*  CALCIUM 9.9 9.9 9.7 9.6 9.3  MG  --  1.9 1.9 2.1 1.9   GFR: Estimated Creatinine Clearance: 30.4 mL/min (A) (by C-G formula based on SCr of 1.18 mg/dL (H)). Liver Function Tests: Recent Labs  Lab 03/26/20 1739 03/27/20 0546 03/28/20 1003 03/29/20 0246 03/30/20 0336  AST 19 19 23 21 18   ALT 13 12 10 12 12   ALKPHOS 48 53 53 53 48  BILITOT 0.2* 0.7 0.7 0.5 0.7  PROT 6.7 6.6 6.6 6.6 6.2*  ALBUMIN 3.7 3.4* 3.4* 3.2* 3.1*   Recent Labs  Lab 03/27/20 0546  LIPASE 47   No results for input(s): AMMONIA in the last 168 hours. Coagulation Profile: No results for input(s): INR, PROTIME in the last 168 hours. Cardiac Enzymes: No results for input(s): CKTOTAL, CKMB, CKMBINDEX, TROPONINI in the last 168 hours. BNP (last 3 results) No results for input(s): PROBNP in the last 8760 hours. HbA1C: No results for input(s): HGBA1C in the last 72 hours. CBG: Recent Labs  Lab 03/29/20 0731 03/29/20 1149 03/29/20 1642 03/29/20 2114 03/30/20 0725  GLUCAP 73 108* 141* 139* 91   Lipid Profile: No results for input(s): CHOL, HDL, LDLCALC, TRIG, CHOLHDL, LDLDIRECT in the last 72 hours. Thyroid Function Tests: No results for input(s): TSH, T4TOTAL, FREET4, T3FREE, THYROIDAB in the last 72 hours. Anemia Panel: No results for input(s): VITAMINB12, FOLATE, FERRITIN, TIBC, IRON, RETICCTPCT in the last 72 hours. Sepsis Labs: No results for input(s): PROCALCITON, LATICACIDVEN in the last 168 hours.  Recent Results (from the past 240 hour(s))  SARS Coronavirus 2 by RT PCR (hospital order, performed in [facility] hospital lab) Nasopharyngeal Nasopharyngeal Swab     Status: None   Collection Time: 03/26/20 11:12 PM   Specimen: Nasopharyngeal Swab  Result Value Ref Range Status   SARS Coronavirus 2 NEGATIVE NEGATIVE Final    Comment: (NOTE) SARS-CoV-2 target nucleic acids  are NOT DETECTED. The SARS-CoV-2 RNA is generally detectable in upper and lower respiratory specimens during the acute phase of infection. The lowest concentration of SARS-CoV-2 viral copies this assay can detect is 250 copies / mL. A negative result does not preclude SARS-CoV-2 infection and should not be used as the sole basis for treatment or other patient management decisions.  A negative result may occur with improper specimen collection / handling, submission of specimen other than nasopharyngeal swab, presence of viral mutation(s) within the areas targeted by this assay, and inadequate number of viral copies (<250 copies / mL). A negative result must be combined with clinical observations, patient history, and epidemiological information. Fact Sheet for Patients:   StrictlyIdeas.no Fact Sheet for Healthcare Providers: BankingDealers.co.za This test is not yet approved or cleared  by the [place] FDA and has been authorized for detection and/or diagnosis of SARS-CoV-2 by FDA under an Emergency Use Authorization (EUA).  This  EUA will remain in effect (meaning this test can be used) for the duration of the COVID-19 declaration under Section 564(b)(1) of the Act, 21 U.S.C. section 360bbb-3(b)(1), unless the authorization is terminated or revoked sooner. Performed at [facility], [place] [address], [place], [place] [address]          Radiology Studies: DG Abd 1 View  Result Date: 03/30/2020 CLINICAL DATA:  Abdominal pain and distention EXAM: ABDOMEN - 1 VIEW COMPARISON:  Abdominal radiograph from 1 day prior FINDINGS: No disproportionately dilated small bowel loops. Prominent rectal stool. Moderate colonic gas. No evidence of pneumatosis or pneumoperitoneum. No radiopaque nephrolithiasis. Cholecystectomy clips are seen in the right upper quadrant of the abdomen. IMPRESSION: Nonobstructive bowel gas pattern. Prominent rectal  stool and moderate colonic gas, cannot exclude rectal fecal impaction. Electronically Signed   By: Ilona Sorrel M.D.   On: 03/30/2020 08:53   DG Abd 1 View  Result Date: 03/29/2020 CLINICAL DATA:  Abdominal distension. EXAM: ABDOMEN - 1 VIEW COMPARISON:  Mar 28, 2020. FINDINGS: No abnormal bowel dilatation is noted. Large amount of stool is noted in the rectum. No radio-opaque calculi or other significant radiographic abnormality are seen. IMPRESSION: Large amount of stool seen in the rectum concerning for fecal impaction. Electronically Signed   By: Marijo Conception M.D.   On: 03/29/2020 08:19        Scheduled Meds: . bisacodyl  10 mg Rectal Once  . enoxaparin (LOVENOX) injection  30 mg Subcutaneous Q24H  . lisinopril  10 mg Oral Daily  . loratadine  10 mg Oral Daily  . pantoprazole  40 mg Oral Daily  . polyethylene glycol  17 g Oral BID  . senna-docusate  1 tablet Oral BID   Continuous Infusions:    LOS: 2 days   Time spent= 35 mins    Tiffannie Sloss Arsenio Loader, MD [facility]  If 7PM-7AM, please contact night-coverage  03/30/2020, 10:47 AM

## 2020-03-30 NOTE — Progress Notes (Signed)
OT Cancellation Note  Patient Details Name: AERON VIERECK MRN: [id number] DOB: [birth year]   Cancelled Treatment:    Reason Eval/Treat Not Completed: Medical issues which prohibited therapy Spoke with RN prior to seeing pt, stated pt has received another enema and is finally having some results. Will defer to a later time.  Corinne Ports E. Shirley Decamp, COTA/L [facility] [place] 03/30/2020, 2:02 PM

## 2020-03-31 LAB — CBC
HCT: 32.8 % — ABNORMAL LOW (ref 36.0–46.0)
Hemoglobin: 10.6 g/dL — ABNORMAL LOW (ref 12.0–15.0)
MCH: 28.9 pg (ref 26.0–34.0)
MCHC: 32.3 g/dL (ref 30.0–36.0)
MCV: 89.4 fL (ref 80.0–100.0)
Platelets: 134 10*3/uL — ABNORMAL LOW (ref 150–400)
RBC: 3.67 MIL/uL — ABNORMAL LOW (ref 3.87–5.11)
RDW: 12.4 % (ref 11.5–15.5)
WBC: 3.3 10*3/uL — ABNORMAL LOW (ref 4.0–10.5)
nRBC: 0 % (ref 0.0–0.2)

## 2020-03-31 LAB — GLUCOSE, CAPILLARY
Glucose-Capillary: 75 mg/dL (ref 70–99)
Glucose-Capillary: 99 mg/dL (ref 70–99)

## 2020-03-31 LAB — BASIC METABOLIC PANEL
Anion gap: 7 (ref 5–15)
BUN: 16 mg/dL (ref 8–23)
CO2: 23 mmol/L (ref 22–32)
Calcium: 9.2 mg/dL (ref 8.9–10.3)
Chloride: 107 mmol/L (ref 98–111)
Creatinine, Ser: 1.19 mg/dL — ABNORMAL HIGH (ref 0.44–1.00)
GFR calc Af Amer: 47 mL/min — ABNORMAL LOW (ref >60)
GFR calc non Af Amer: 41 mL/min — ABNORMAL LOW (ref >60)
Glucose, Bld: 78 mg/dL (ref 70–99)
Potassium: 4.4 mmol/L (ref 3.5–5.1)
Sodium: 137 mmol/L (ref 135–145)

## 2020-03-31 LAB — MAGNESIUM: Magnesium: 1.9 mg/dL (ref 1.7–2.4)

## 2020-03-31 MED ORDER — SENNOSIDES-DOCUSATE SODIUM 8.6-50 MG PO TABS: 1.0000 | ORAL_TABLET | Freq: Every evening | ORAL | 0 refills | Status: DC | PRN

## 2020-03-31 MED ORDER — POLYETHYLENE GLYCOL 3350 17 G PO PACK: 17.0000 g | PACK | Freq: Every day | ORAL | 0 refills | Status: DC | PRN

## 2020-03-31 NOTE — Discharge Summary (Signed)
Physician Discharge Summary  Erika Day [id number] DOB: July 07, 1930 DOA: 03/26/2020  PCP: Mack Hook, MD  Admit date: 03/26/2020 Discharge date: 03/31/2020  Admitted From: Home Disposition: Home  Recommendations for Outpatient Follow-up:  1. Follow up with PCP in 1-2 weeks 2. Please obtain BMP/CBC in one week your next doctors visit.  3. Bowel regimen daily.  Advised to have at least 1-2 soft bowel movements daily.  Discharge Condition: Stable CODE STATUS: Full Diet recommendation: Heart healthy  Brief/Interim Summary: 86 year old with history of GERD, marginal zone lymphoma follows with Dr. Alvy Bimler, MGUS, CKD stage III, chronic constipation, diastolic CHF, HTN, sigmoid volvulus status post colectomy in 2017 presents with abdominal pain.  Found to have severe constipation started on aggressive bowel regimen.  Over the course of several days with multiple enemas and oral regimen she started having good bowel movements with a lot of relief.  Today she is medically deemed stable to be discharged.  I extensively updated family regarding their in-hospital and follow-up care.   Assessment & Plan:   Principal Problem:   Abdominal pain Active Problems:   Essential hypertension   Marginal zone lymphoma of axilla (HCC)   Chronic constipation   Pancytopenia (HCC)   MGUS (monoclonal gammopathy of unknown significance)   Elevated d-dimer   Chronic kidney disease, stage 3b   Uncontrolled type 2 diabetes mellitus with hyperglycemia (HCC)   Pressure injury of skin  Abdominal pain secondary to severe abdominal constipation -Much better today.  Had couple of large bowel movements yesterday after getting enema and p.o. bowel regimen.  She is passing gas this morning.  Abdominal pain is subsided.  I have advised to continue taking oral stool softeners and laxatives as necessary to have at least 1-2 soft bowel movements daily.  Essential hypertension -Lisinopril  Elevated  D-dimer -Likely from underlying comorbidities.  CTA angio negative for PE -Lower extremity Dopplers-negative for DVT  Marginal zone lymphoma MGUS -Follow-up outpatient oncology   Discharge Diagnoses:  Principal Problem:   Abdominal pain Active Problems:   Essential hypertension   Marginal zone lymphoma of axilla (HCC)   Chronic constipation   Pancytopenia (HCC)   MGUS (monoclonal gammopathy of unknown significance)   Elevated d-dimer   Chronic kidney disease, stage 3b   Uncontrolled type 2 diabetes mellitus with hyperglycemia (HCC)   Pressure injury of skin    Consultations:  None  Subjective: Feels okay no complaints.  Abdominal pain is subsided, she is passing gas.  Discharge Exam: Vitals:   03/30/20 2148 03/31/20 0501  BP: 139/68 (!) 152/104  Pulse: 66 77  Resp: 16 17  Temp: 98.6 F (37 C) 98.6 F (37 C)  SpO2: 99% 99%   Vitals:   03/30/20 0558 03/30/20 1424 03/30/20 2148 03/31/20 0501  BP: (!) 175/75 136/70 139/68 (!) 152/104  Pulse:  71 66 77  Resp:  20 16 17   Temp:  97.8 F (36.6 C) 98.6 F (37 C) 98.6 F (37 C)  TempSrc:  Oral Oral Oral  SpO2:  99% 99% 99%  Weight:        General: Pt is alert, awake, not in acute distress, elderly frail.  Very pleasant. Cardiovascular: RRR, S1/S2 +, no rubs, no gallops Respiratory: CTA bilaterally, no wheezing, no rhonchi Abdominal: Soft, NT, ND, bowel sounds + Extremities: no edema, no cyanosis  Discharge Instructions   Allergies as of 03/31/2020      Reactions   Morphine And Related Other (See Comments)   Blood sugar dropped one-time  Medication List    TAKE these medications   cetirizine 10 MG tablet Commonly known as: ZYRTEC 1 tab by mouth once daily with evening meal for allergies   furosemide 40 MG tablet Commonly known as: Lasix Take 1 tablet (40 mg total) by mouth daily.   lisinopril 10 MG tablet Commonly known as: ZESTRIL Take 1 tablet (10 mg total) by mouth daily.    pantoprazole 40 MG tablet Commonly known as: PROTONIX 1 tab by mouth daily at 10 pm on empty stomach   polyethylene glycol 17 g packet Commonly known as: MIRALAX / GLYCOLAX Take 17 g by mouth daily as needed.   potassium chloride 10 MEQ tablet Commonly known as: KLOR-CON TAKE 1 TABLET(10 MEQ) BY MOUTH DAILY What changed: See the new instructions.   senna-docusate 8.6-50 MG tablet Commonly known as: Senokot-S Take 1 tablet by mouth at bedtime as needed for mild constipation.      Follow-up Information    Mack Hook, MD Follow up.   Specialty: Internal Medicine Why: June 12, 2020 1100 am  Contact information: [address] [place] [address] [phone]          Allergies  Allergen Reactions  . Morphine And Related Other (See Comments)    Blood sugar dropped one-time    You were cared for by a hospitalist during your hospital stay. If you have any questions about your discharge medications or the care you received while you were in the hospital after you are discharged, you can call the unit and asked to speak with the hospitalist on call if the hospitalist that took care of you is not available. Once you are discharged, your primary care physician will handle any further medical issues. Please note that no refills for any discharge medications will be authorized once you are discharged, as it is imperative that you return to your primary care physician (or establish a relationship with a primary care physician if you do not have one) for your aftercare needs so that they can reassess your need for medications and monitor your lab values.   Procedures/Studies: DG Abd 1 View  Result Date: 03/30/2020 CLINICAL DATA:  Abdominal pain and distention EXAM: ABDOMEN - 1 VIEW COMPARISON:  Abdominal radiograph from 1 day prior FINDINGS: No disproportionately dilated small bowel loops. Prominent rectal stool. Moderate colonic gas. No evidence of pneumatosis or  pneumoperitoneum. No radiopaque nephrolithiasis. Cholecystectomy clips are seen in the right upper quadrant of the abdomen. IMPRESSION: Nonobstructive bowel gas pattern. Prominent rectal stool and moderate colonic gas, cannot exclude rectal fecal impaction. Electronically Signed   By: Ilona Sorrel M.D.   On: 03/30/2020 08:53   DG Abd 1 View  Result Date: 03/29/2020 CLINICAL DATA:  Abdominal distension. EXAM: ABDOMEN - 1 VIEW COMPARISON:  Mar 28, 2020. FINDINGS: No abnormal bowel dilatation is noted. Large amount of stool is noted in the rectum. No radio-opaque calculi or other significant radiographic abnormality are seen. IMPRESSION: Large amount of stool seen in the rectum concerning for fecal impaction. Electronically Signed   By: Marijo Conception M.D.   On: 03/29/2020 08:19   DG Abd 1 View  Result Date: 03/28/2020 CLINICAL DATA:  Abdominal distention. EXAM: ABDOMEN - 1 VIEW COMPARISON:  CT 03/26/2020. FINDINGS: Surgical clips right upper quadrant. No bowel distention. Large amount of stool noted throughout the colon, particularly in the rectum. Fecal impaction cannot be excluded. No free air noted. Degenerative changes and scoliosis lumbar spine. Postsurgical changes left hip. Aortoiliac  atherosclerotic vascular calcification. IMPRESSION: Large amount of stool in the colon, particularly in the rectum. Fecal impaction cannot be excluded. No bowel distention. No acute abnormality identified. Electronically Signed   By: Marcello Moores  Register   On: 03/28/2020 09:23   CT Angio Chest PE W and/or Wo Contrast  Result Date: 03/26/2020 CLINICAL DATA:  Shortness of breath. EXAM: CT ANGIOGRAPHY CHEST WITH CONTRAST TECHNIQUE: Multidetector CT imaging of the chest was performed using the standard protocol during bolus administration of intravenous contrast. Multiplanar CT image reconstructions and MIPs were obtained to evaluate the vascular anatomy. CONTRAST:  61mL OMNIPAQUE IOHEXOL 350 MG/ML SOLN COMPARISON:  February 13, 2016 FINDINGS: Cardiovascular: There is marked severity calcification of the thoracic aorta. Satisfactory opacification of the pulmonary arteries to the segmental level. No evidence of pulmonary embolism. Normal heart size. No pericardial effusion. Mediastinum/Nodes: No enlarged mediastinal, hilar, or axillary lymph nodes. Thyroid gland, trachea, and esophagus demonstrate no significant findings. Lungs/Pleura: Mild to moderate severity right basilar atelectasis is seen with mild atelectasis noted within the left lung base. There is marked severity elevation of the right hemidiaphragm. This is seen on the prior study. There is no evidence of a pleural effusion or pneumothorax. Upper Abdomen: There is a small hiatal hernia. Multiple surgical clips are seen within the gallbladder fossa. Musculoskeletal: Multilevel degenerative changes are seen throughout the thoracic spine. Review of the MIP images confirms the above findings. IMPRESSION: 1. No evidence of pulmonary embolism. 2. Mild to moderate severity right basilar atelectasis with mild left basilar atelectasis. 3. Marked severity elevation of the right hemidiaphragm. 4. Small hiatal hernia. Aortic Atherosclerosis (ICD10-I70.0). Electronically Signed   By: Virgina Norfolk M.D.   On: 03/26/2020 23:09   CT ABDOMEN PELVIS W CONTRAST  Result Date: 03/26/2020 CLINICAL DATA:  Shortness of breath. EXAM: CT ABDOMEN AND PELVIS WITH CONTRAST TECHNIQUE: Multidetector CT imaging of the abdomen and pelvis was performed using the standard protocol following bolus administration of intravenous contrast. CONTRAST:  66mL OMNIPAQUE IOHEXOL 350 MG/ML SOLN COMPARISON:  April 15, 2018 FINDINGS: Lower chest: Mild to moderate severity atelectasis is seen within the bilateral lung bases, right greater than left. There is stable marked severity elevation of the right hemidiaphragm. Hepatobiliary: No focal liver abnormality is seen. Status post cholecystectomy. No biliary dilatation.  Pancreas: Unremarkable. No pancreatic ductal dilatation or surrounding inflammatory changes. Spleen: Normal in size without focal abnormality. Adrenals/Urinary Tract: The right adrenal gland is not clearly identified. The left adrenal gland is unremarkable. The kidneys are mildly decreased in size, without renal calculi, focal lesions, or hydronephrosis. Bladder is unremarkable. Stomach/Bowel: Stomach is within normal limits. The appendix is not clearly identified. Surgically anastomosed bowel is seen within the expected region of the mid sigmoid colon. A very large amount of stool is seen within a distended distal sigmoid colon and rectum. The remaining loops of large and small bowel are normal in caliber. Vascular/Lymphatic: There is marked severity calcification of the abdominal aorta. No enlarged abdominal or pelvic lymph nodes. Reproductive: The uterus is small in size and contains several subcentimeter coarse parenchymal calcifications. The bilateral adnexa are unremarkable. Other: No abdominal wall hernia or abnormality. No abdominopelvic ascites. Musculoskeletal: A metallic density intramedullary rod and compression screw device are seen within the proximal left femur. Multilevel marked severity degenerative changes are noted throughout the lumbar spine. IMPRESSION: 1. Very large amount of stool within a distended distal sigmoid colon and rectum. 2. Mild to moderate severity atelectasis within the bilateral lung bases, right greater than  left. 3. Stable marked severity elevation of the right hemidiaphragm. 4. Evidence of prior cholecystectomy. 5. Prior open reduction internal fixation of the proximal left femur. Aortic Atherosclerosis (ICD10-I70.0). Electronically Signed   By: Virgina Norfolk M.D.   On: 03/26/2020 23:14   DG Chest Portable 1 View  Result Date: 03/26/2020 CLINICAL DATA:  Weakness, short of breath, lower extremity edema EXAM: PORTABLE CHEST 1 VIEW COMPARISON:  06/28/2019 FINDINGS: Single  frontal view of the chest demonstrates a stable cardiac silhouette. There is chronic elevation of the right hemidiaphragm, with new right basilar consolidation. Left chest is clear. No pneumothorax. IMPRESSION: 1. Right basilar consolidation, which could reflect atelectasis or airspace disease. 2. Chronic elevation right hemidiaphragm. Electronically Signed   By: Randa Ngo M.D.   On: 03/26/2020 17:45   DG Abd 2 Views  Result Date: 03/26/2020 CLINICAL DATA:  Abdominal pain, lower extremity edema EXAM: ABDOMEN - 2 VIEW COMPARISON:  04/15/2018 FINDINGS: 2 supine frontal views of the abdomen and pelvis demonstrate an unremarkable bowel gas pattern. Significant retained stool within the rectal vault consistent with fecal impaction. There are no masses or abnormal calcifications. There is chronic elevation of the right hemidiaphragm. No masses or abnormal calcifications. IMPRESSION: 1. Significant retained stool within the rectal vault which could reflect fecal impaction. 2. No evidence of bowel obstruction or ileus. Electronically Signed   By: Randa Ngo M.D.   On: 03/26/2020 21:15   ECHOCARDIOGRAM COMPLETE  Result Date: 03/27/2020    ECHOCARDIOGRAM REPORT   Patient Name:   Erika Day Date of Exam: 03/27/2020 Medical Rec #:  [id number]     Height:       62.0 in Accession #:    [id number]    Weight:       156.0 lb Date of Birth:  11/11/1929     BSA:          1.720 m Patient Age:    20 years      BP:           184/69 mmHg Patient Gender: F             HR:           56 bpm. Exam Location:  Inpatient Procedure: 2D Echo, Cardiac Doppler and Color Doppler Indications:    CHF-Acute Diastolic [id number] / [id number]                 Aortic Stenosis 424.1 / 135.0  History:        Patient has prior history of Echocardiogram examinations, most                 recent 06/29/2019. CHF; Risk Factors:Hypertension, Diabetes and                 Former Smoker.  Sonographer:    Vickie Epley RDCS Referring Phys: [id number] [place]  1. Left ventricular ejection fraction, by estimation, is 60 to 65%. The left ventricle has normal function. The left ventricle has no regional wall motion abnormalities. There is moderate concentric left ventricular hypertrophy. Left ventricular diastolic function could not be evaluated.  2. Right ventricular systolic function is normal. The right ventricular size is normal. There is normal pulmonary artery systolic pressure. The estimated right ventricular systolic pressure is 99991111 mmHg.  3. Left atrial size was mildly dilated.  4. The mitral valve is degenerative. No evidence of mitral valve regurgitation. No evidence of mitral stenosis.  5. The aortic valve is  tricuspid. Aortic valve regurgitation is trivial. Moderate aortic valve stenosis. Aortic valve area, by VTI measures 1.06 cm. Aortic valve mean gradient measures 26.5 mmHg. Aortic valve Vmax measures 3.30 m/s.  6. The inferior vena cava is normal in size with <50% respiratory variability, suggesting right atrial pressure of 8 mmHg. Comparison(s): No significant change from prior study. LVEF is normal 60-65%. AS remains in moderate range. FINDINGS  Left Ventricle: Left ventricular ejection fraction, by estimation, is 60 to 65%. The left ventricle has normal function. The left ventricle has no regional wall motion abnormalities. The left ventricular internal cavity size was small. There is moderate  concentric left ventricular hypertrophy. Left ventricular diastolic function could not be evaluated due to mitral annular calcification (moderate or greater). Left ventricular diastolic function could not be evaluated. Right Ventricle: The right ventricular size is normal. No increase in right ventricular wall thickness. Right ventricular systolic function is normal. There is normal pulmonary artery systolic pressure. The tricuspid regurgitant velocity is 2.08 m/s, and  with an assumed right atrial pressure of 8 mmHg, the estimated right  ventricular systolic pressure is 99991111 mmHg. Left Atrium: Left atrial size was mildly dilated. Right Atrium: Right atrial size was normal in size. Pericardium: Trivial pericardial effusion is present. Presence of pericardial fat pad. Mitral Valve: The mitral valve is degenerative in appearance. Moderate to severe mitral annular calcification. No evidence of mitral valve regurgitation. No evidence of mitral valve stenosis. Tricuspid Valve: The tricuspid valve is grossly normal. Tricuspid valve regurgitation is trivial. No evidence of tricuspid stenosis. Aortic Valve: The aortic valve is tricuspid. . There is severe thickening and severe calcifcation of the aortic valve. Aortic valve regurgitation is trivial. Aortic regurgitation PHT measures 626 msec. Moderate aortic stenosis is present. There is severe  thickening of the aortic valve. There is severe calcifcation of the aortic valve. Aortic valve mean gradient measures 26.5 mmHg. Aortic valve peak gradient measures 43.4 mmHg. Aortic valve area, by VTI measures 1.06 cm. Pulmonic Valve: The pulmonic valve was grossly normal. Pulmonic valve regurgitation is not visualized. No evidence of pulmonic stenosis. Aorta: The aortic root and ascending aorta are structurally normal, with no evidence of dilitation. Venous: The inferior vena cava is normal in size with less than 50% respiratory variability, suggesting right atrial pressure of 8 mmHg. IAS/Shunts: The atrial septum is grossly normal.  LEFT VENTRICLE PLAX 2D LVIDd:         3.41 cm     Diastology LVIDs:         2.53 cm     LV e' lateral:   4.12 cm/s LV PW:         1.52 cm     LV E/e' lateral: 16.4 LV IVS:        1.52 cm     LV e' medial:    2.98 cm/s LVOT diam:     2.00 cm     LV E/e' medial:  22.7 LV SV:         74 LV SV Index:   43 LVOT Area:     3.14 cm  LV Volumes (MOD) LV vol d, MOD A2C: 80.3 ml LV vol d, MOD A4C: 87.3 ml LV vol s, MOD A2C: 26.7 ml LV vol s, MOD A4C: 33.8 ml LV SV MOD A2C:     53.6 ml LV SV MOD  A4C:     87.3 ml LV SV MOD BP:      55.6 ml RIGHT VENTRICLE RV S prime:  12.40 cm/s TAPSE (M-mode): 1.9 cm LEFT ATRIUM             Index LA diam:        3.80 cm 2.21 cm/m LA Vol (A2C):   30.4 ml 17.67 ml/m LA Vol (A4C):   57.1 ml 33.20 ml/m LA Biplane Vol: 45.2 ml 26.28 ml/m  AORTIC VALVE AV Area (Vmax):    0.92 cm AV Area (Vmean):   0.91 cm AV Area (VTI):     1.06 cm AV Vmax:           329.50 cm/s AV Vmean:          238.000 cm/s AV VTI:            0.695 m AV Peak Grad:      43.4 mmHg AV Mean Grad:      26.5 mmHg LVOT Vmax:         96.10 cm/s LVOT Vmean:        68.900 cm/s LVOT VTI:          0.234 m LVOT/AV VTI ratio: 0.34 AI PHT:            626 msec  AORTA Ao Root diam: 3.40 cm Ao Asc diam:  4.00 cm MITRAL VALVE                TRICUSPID VALVE MV Area (PHT): 2.32 cm     TR Peak grad:   17.3 mmHg MV Decel Time: 327 msec     TR Vmax:        208.00 cm/s MV E velocity: 67.70 cm/s MV A velocity: 111.00 cm/s  SHUNTS MV E/A ratio:  0.61         Systemic VTI:  0.23 m                             Systemic Diam: 2.00 cm Eleonore Chiquito MD Electronically signed by Eleonore Chiquito MD Signature Date/Time: 03/27/2020/3:41:00 PM    Final    VAS [place] LOWER EXTREMITY VENOUS (DVT)  Result Date: 03/27/2020  Lower Venous DVTStudy Indications: Edema.  Comparison Study: 06/28/19 negative Performing Technologist: June Leap RDMS, RVT  Examination Guidelines: A complete evaluation includes B-mode imaging, spectral Doppler, color Doppler, and power Doppler as needed of all accessible portions of each vessel. Bilateral testing is considered an integral part of a complete examination. Limited examinations for reoccurring indications may be performed as noted. The reflux portion of the exam is performed with the patient in reverse Trendelenburg.  +---------+---------------+---------+-----------+----------+--------------+ RIGHT    CompressibilityPhasicitySpontaneityPropertiesThrombus Aging  +---------+---------------+---------+-----------+----------+--------------+ CFV      Full           Yes      Yes                                 +---------+---------------+---------+-----------+----------+--------------+ SFJ      Full                                                        +---------+---------------+---------+-----------+----------+--------------+ FV Prox  Full                                                        +---------+---------------+---------+-----------+----------+--------------+  FV Mid   Full                                                        +---------+---------------+---------+-----------+----------+--------------+ FV DistalFull                                                        +---------+---------------+---------+-----------+----------+--------------+ PFV      Full                                                        +---------+---------------+---------+-----------+----------+--------------+ POP      Full           Yes      Yes                                 +---------+---------------+---------+-----------+----------+--------------+ PTV      Full                                                        +---------+---------------+---------+-----------+----------+--------------+ PERO     Full                                                        +---------+---------------+---------+-----------+----------+--------------+   +---------+---------------+---------+-----------+----------+--------------+ LEFT     CompressibilityPhasicitySpontaneityPropertiesThrombus Aging +---------+---------------+---------+-----------+----------+--------------+ CFV      Full           Yes      Yes                                 +---------+---------------+---------+-----------+----------+--------------+ SFJ      Full                                                         +---------+---------------+---------+-----------+----------+--------------+ FV Prox  Full                                                        +---------+---------------+---------+-----------+----------+--------------+ FV Mid   Full                                                        +---------+---------------+---------+-----------+----------+--------------+  FV DistalFull                                                        +---------+---------------+---------+-----------+----------+--------------+ PFV      Full                                                        +---------+---------------+---------+-----------+----------+--------------+ POP      Full           Yes      Yes                                 +---------+---------------+---------+-----------+----------+--------------+ PTV      Full                                                        +---------+---------------+---------+-----------+----------+--------------+ PERO     Full                                                        +---------+---------------+---------+-----------+----------+--------------+     Summary: BILATERAL: - No evidence of deep vein thrombosis seen in the lower extremities, bilaterally. - RIGHT: - A cystic structure is found in the popliteal fossa.  LEFT: - No cystic structure found in the popliteal fossa.  *See table(s) above for measurements and observations. Electronically signed by Curt Jews MD on 03/27/2020 at 7:14:51 PM.    Final       The results of significant diagnostics from this hospitalization (including imaging, microbiology, ancillary and laboratory) are listed below for reference.     Microbiology: Recent Results (from the past 240 hour(s))  SARS Coronavirus 2 by RT PCR (hospital order, performed in [facility] hospital lab) Nasopharyngeal Nasopharyngeal Swab     Status: None   Collection Time: 03/26/20 11:12 PM   Specimen: Nasopharyngeal Swab   Result Value Ref Range Status   SARS Coronavirus 2 NEGATIVE NEGATIVE Final    Comment: (NOTE) SARS-CoV-2 target nucleic acids are NOT DETECTED. The SARS-CoV-2 RNA is generally detectable in upper and lower respiratory specimens during the acute phase of infection. The lowest concentration of SARS-CoV-2 viral copies this assay can detect is 250 copies / mL. A negative result does not preclude SARS-CoV-2 infection and should not be used as the sole basis for treatment or other patient management decisions.  A negative result may occur with improper specimen collection / handling, submission of specimen other than nasopharyngeal swab, presence of viral mutation(s) within the areas targeted by this assay, and inadequate number of viral copies (<250 copies / mL). A negative result must be combined with clinical observations, patient history, and epidemiological information. Fact Sheet for Patients:   StrictlyIdeas.no Fact Sheet for Healthcare Providers: BankingDealers.co.za This test is not yet approved or  cleared  by the [place] and has been authorized for detection and/or diagnosis of SARS-CoV-2 by FDA under an Emergency Use Authorization (EUA).  This EUA will remain in effect (meaning this test can be used) for the duration of the COVID-19 declaration under Section 564(b)(1) of the Act, 21 U.S.C. section 360bbb-3(b)(1), unless the authorization is terminated or revoked sooner. Performed at [facility], [place] [address], [place], [place] [address]      Labs: BNP (last 3 results) Recent Labs    06/28/19 1642 03/26/20 1739  BNP 275.5* 123XX123*   Basic Metabolic Panel: Recent Labs  Lab 03/27/20 0546 03/28/20 1003 03/29/20 0246 03/30/20 0336 03/31/20 0321  NA 137 136 138 137 137  K 4.7 4.6 5.0 4.3 4.4  CL 104 104 105 106 107  CO2 25 21* 26 23 23   GLUCOSE 118* 103* 95 114* 78  BUN 27* 20 19 15 16   CREATININE  1.45* 1.38* 1.27* 1.18* 1.19*  CALCIUM 9.9 9.7 9.6 9.3 9.2  MG 1.9 1.9 2.1 1.9 1.9   Liver Function Tests: Recent Labs  Lab 03/26/20 1739 03/27/20 0546 03/28/20 1003 03/29/20 0246 03/30/20 0336  AST 19 19 23 21 18   ALT 13 12 10 12 12   ALKPHOS 48 53 53 53 48  BILITOT 0.2* 0.7 0.7 0.5 0.7  PROT 6.7 6.6 6.6 6.6 6.2*  ALBUMIN 3.7 3.4* 3.4* 3.2* 3.1*   Recent Labs  Lab 03/27/20 0546 03/30/20 1823  LIPASE 47 36   No results for input(s): AMMONIA in the last 168 hours. CBC: Recent Labs  Lab 03/26/20 1739 03/26/20 1739 03/27/20 0546 03/28/20 1003 03/29/20 0246 03/30/20 0336 03/31/20 0321  WBC 2.6*   < > 2.5* 3.0* 3.5* 2.5* 3.3*  NEUTROABS 1.2*  --  1.3*  --   --   --   --   HGB 9.9*   < > 10.4* 11.2* 10.6* 10.1* 10.6*  HCT 31.7*   < > 32.8* 35.0* 33.4* 31.8* 32.8*  MCV 91.9   < > 90.9 90.2 90.5 89.3 89.4  PLT 132*   < > 112* 156 149* 137* 134*   < > = values in this interval not displayed.   Cardiac Enzymes: No results for input(s): CKTOTAL, CKMB, CKMBINDEX, TROPONINI in the last 168 hours. BNP: Invalid input(s): POCBNP CBG: Recent Labs  Lab 03/30/20 0725 03/30/20 1144 03/30/20 1702 03/30/20 2148 03/31/20 0737  GLUCAP 91 109* 101* 135* 75   D-Dimer No results for input(s): DDIMER in the last 72 hours. Hgb A1c No results for input(s): HGBA1C in the last 72 hours. Lipid Profile No results for input(s): CHOL, HDL, LDLCALC, TRIG, CHOLHDL, LDLDIRECT in the last 72 hours. Thyroid function studies No results for input(s): TSH, T4TOTAL, T3FREE, THYROIDAB in the last 72 hours.  Invalid input(s): FREET3 Anemia work up No results for input(s): VITAMINB12, FOLATE, FERRITIN, TIBC, IRON, RETICCTPCT in the last 72 hours. Urinalysis    Component Value Date/Time   COLORURINE YELLOW 03/26/2020 [place] 03/26/2020 1840   LABSPEC 1.009 03/26/2020 1840   PHURINE 8.0 03/26/2020 1840   GLUCOSEU NEGATIVE 03/26/2020 1840   HGBUR NEGATIVE 03/26/2020 1840    BILIRUBINUR NEGATIVE 03/26/2020 1840   BILIRUBINUR neg 10/20/2018 1649   KETONESUR NEGATIVE 03/26/2020 1840   PROTEINUR NEGATIVE 03/26/2020 1840   UROBILINOGEN 0.2 10/20/2018 1649   UROBILINOGEN 0.2 05/17/2015 2307   NITRITE NEGATIVE 03/26/2020 1840   LEUKOCYTESUR NEGATIVE 03/26/2020 1840   Sepsis Labs Invalid input(s): PROCALCITONIN,  WBC,  LACTICIDVEN Microbiology Recent Results (from the past 240 hour(s))  SARS Coronavirus 2 by RT PCR (hospital order, performed in [facility] hospital lab) Nasopharyngeal Nasopharyngeal Swab     Status: None   Collection Time: 03/26/20 11:12 PM   Specimen: Nasopharyngeal Swab  Result Value Ref Range Status   SARS Coronavirus 2 NEGATIVE NEGATIVE Final    Comment: (NOTE) SARS-CoV-2 target nucleic acids are NOT DETECTED. The SARS-CoV-2 RNA is generally detectable in upper and lower respiratory specimens during the acute phase of infection. The lowest concentration of SARS-CoV-2 viral copies this assay can detect is 250 copies / mL. A negative result does not preclude SARS-CoV-2 infection and should not be used as the sole basis for treatment or other patient management decisions.  A negative result may occur with improper specimen collection / handling, submission of specimen other than nasopharyngeal swab, presence of viral mutation(s) within the areas targeted by this assay, and inadequate number of viral copies (<250 copies / mL). A negative result must be combined with clinical observations, patient history, and epidemiological information. Fact Sheet for Patients:   StrictlyIdeas.no Fact Sheet for Healthcare Providers: BankingDealers.co.za This test is not yet approved or cleared  by the [place] FDA and has been authorized for detection and/or diagnosis of SARS-CoV-2 by FDA under an Emergency Use Authorization (EUA).  This EUA will remain in effect (meaning this test can be used) for the  duration of the COVID-19 declaration under Section 564(b)(1) of the Act, 21 U.S.C. section 360bbb-3(b)(1), unless the authorization is terminated or revoked sooner. Performed at [facility], [place] [address], [place], [place] [address]      Time coordinating discharge:  I have spent 35 minutes face to face with the patient and on the ward discussing the patients care, assessment, plan and disposition with other care givers. >50% of the time was devoted counseling the patient about the risks and benefits of treatment/Discharge disposition and coordinating care.   SIGNED:   Damita Lack, MD  [facility] 03/31/2020, 10:34 AM   If 7PM-7AM, please contact night-coverage

## 2020-03-31 NOTE — Plan of Care (Signed)

## 2020-03-31 NOTE — Progress Notes (Signed)
Pt DC scheduled for today. Pt alert and oriented x4 sitting in the bedside chair. Pt provided with appt reminders, e-prescriptions, and educational information. Pt verbalized understanding of discharge instructions. Pt transported via wheelchair to main lobby with family present without incident.

## 2020-04-10 ENCOUNTER — Ambulatory Visit (INDEPENDENT_AMBULATORY_CARE_PROVIDER_SITE_OTHER): Payer: Medicare Other | Admitting: Internal Medicine

## 2020-04-10 ENCOUNTER — Encounter: Payer: Self-pay | Admitting: Internal Medicine

## 2020-04-10 VITALS — BP 130/84 | HR 68 | Resp 12

## 2020-04-10 DIAGNOSIS — R600 Localized edema: Secondary | ICD-10-CM

## 2020-04-10 DIAGNOSIS — D649 Anemia, unspecified: Secondary | ICD-10-CM

## 2020-04-10 DIAGNOSIS — K5900 Constipation, unspecified: Secondary | ICD-10-CM | POA: Diagnosis not present

## 2020-04-10 DIAGNOSIS — L97519 Non-pressure chronic ulcer of other part of right foot with unspecified severity: Secondary | ICD-10-CM

## 2020-04-10 DIAGNOSIS — K219 Gastro-esophageal reflux disease without esophagitis: Secondary | ICD-10-CM | POA: Insufficient documentation

## 2020-04-10 DIAGNOSIS — I10 Essential (primary) hypertension: Secondary | ICD-10-CM | POA: Diagnosis not present

## 2020-04-10 DIAGNOSIS — L97529 Non-pressure chronic ulcer of other part of left foot with unspecified severity: Secondary | ICD-10-CM

## 2020-04-10 NOTE — Progress Notes (Signed)
Subjective:    Patient ID: Erika Day, female   DOB: Jul 31, 1930, 86 y.o.   MRN: [id number]   HPI   1.  Constipation:  Patient was hospitalized with abdominal pain from 5/25 through 03/31/20.  Not clear if she just did not start her Miralax again when constipation redeveloped.  She has started and stopped based on constipation and then the development of diarrhea.    Having soft yet formed BM every day since discharge.  She is using the Miralax daily.  Initially stated she waits to see if she will have a BM before taking Miralax.  Has used the Dulcolax maybe once or twice since discharge.   Did not bring in meds to see if she is differentiating between Senna and Dulcolax.  She has her pillbox, but her daughter, June Leap, has the bottles of pills somewhere else.  2.  We are trying to transition her to another home health aide and company as her and her husband's home organization, cleanliness, insect infestation, meals and bathing have not been well controlled.   Daughter, [name] [facility]), was here with her.  We were unable to get her set up with [facility] or [facility].  [facility] is who we were planning to work with, but not clear contact has been made with family.  3.  LE edema and foot ulcers:  These are doing well.  States the ulcerative lesions have healed. [facility] home nursing comes in every other day to dress and put on compression stockings.  Being weighed every other day to every day.  States she is following directions with elevation of legs in hospital bed with sleep--not dangling legs as she has done previously.  4.  Sense of something stuck in throat--states has been there before was in hospital.  Feels like something hanging there.  Not always there.  States if can belch, the sense of something there goes away.  Current Meds  Medication Sig  . bisacodyl (DULCOLAX) 5 MG EC tablet Take 5 mg by mouth daily as needed for moderate constipation.  . cetirizine (ZYRTEC) 10 MG tablet 1 tab by  mouth once daily with evening meal for allergies  . furosemide (LASIX) 40 MG tablet Take 1 tablet (40 mg total) by mouth daily.  Marland Kitchen lisinopril (ZESTRIL) 10 MG tablet Take 1 tablet (10 mg total) by mouth daily.  . pantoprazole (PROTONIX) 40 MG tablet 1 tab by mouth daily at 10 pm on empty stomach  . polyethylene glycol (MIRALAX / GLYCOLAX) 17 g packet Take 17 g by mouth daily as needed.  . potassium chloride (KLOR-CON) 10 MEQ tablet TAKE 1 TABLET(10 MEQ) BY MOUTH DAILY  . senna-docusate (SENOKOT-S) 8.6-50 MG tablet Take 1 tablet by mouth at bedtime as needed for mild constipation.    Allergies  Allergen Reactions  . Morphine And Related Other (See Comments)    Blood sugar dropped one-time     Review of Systems    Objective:   BP 130/84 (BP Location: Left Arm, Patient Position: Sitting, Cuff Size: Normal)   Pulse 68   Resp 12   Physical Exam  NAD HEENT:  PERRL, EOMI, throat without mass or swelling, particularly right side. Neck:  Supple, No adenopathy Lungs:  CTA CV:  RRR with Grade 2/6 SEM.  Radial and DP pulses normal and equal. Abd:  S, NT, No HSM or mass.  + BS LE:  Mild edema of both legs:  Both wrapped and with compression dressings.   Assessment &  Plan   1.  Abdominal pain due to severe constipation:  Discussed with her to pay attention to her stools.  If formed and soft, to maintain the Miralax daily as she is currently doing.  Had her repeat back to me what to do if her stools become to loose--decrease dose of Miralax or decrease to every other day.  2.  GERD:  Need to see if she is still taking Pantoprazole.  3  Hypertension:  Controlled  4.  LE edema and foot ulcers:  Healed and edema improved with compression and sleep with elevation of legs.  5.  Health Aide:  Found out at end of visit that [facility] requires Medicaid, which she does not have.  She is with [facility] now.  Will need to see from other CHW, Peter Congo, if other home health agencies available with  Medicare.  They are not paying out of pocket. Later, also found out she has Medicaid.   Comfortable with Stephenie Acres, CHW checking in with them weekly.

## 2020-04-11 LAB — CBC WITH DIFFERENTIAL/PLATELET
Basophils Absolute: 0 10*3/uL (ref 0.0–0.2)
Basos: 1 %
EOS (ABSOLUTE): 0.1 10*3/uL (ref 0.0–0.4)
Eos: 3 %
Hematocrit: 33.8 % — ABNORMAL LOW (ref 34.0–46.6)
Hemoglobin: 11.2 g/dL (ref 11.1–15.9)
Immature Grans (Abs): 0 10*3/uL (ref 0.0–0.1)
Immature Granulocytes: 0 %
Lymphocytes Absolute: 1.3 10*3/uL (ref 0.7–3.1)
Lymphs: 37 %
MCH: 29 pg (ref 26.6–33.0)
MCHC: 33.1 g/dL (ref 31.5–35.7)
MCV: 88 fL (ref 79–97)
Monocytes Absolute: 0.2 10*3/uL (ref 0.1–0.9)
Monocytes: 6 %
Neutrophils Absolute: 1.9 10*3/uL (ref 1.4–7.0)
Neutrophils: 53 %
Platelets: 188 10*3/uL (ref 150–450)
RBC: 3.86 x10E6/uL (ref 3.77–5.28)
RDW: 13.9 % (ref 11.7–15.4)
WBC: 3.5 10*3/uL (ref 3.4–10.8)

## 2020-04-11 LAB — BASIC METABOLIC PANEL
BUN/Creatinine Ratio: 20 (ref 12–28)
BUN: 31 mg/dL — ABNORMAL HIGH (ref 8–27)
CO2: 23 mmol/L (ref 20–29)
Calcium: 10.1 mg/dL (ref 8.7–10.3)
Chloride: 100 mmol/L (ref 96–106)
Creatinine, Ser: 1.52 mg/dL — ABNORMAL HIGH (ref 0.57–1.00)
GFR calc Af Amer: 35 mL/min/{1.73_m2} — ABNORMAL LOW (ref >59)
GFR calc non Af Amer: 30 mL/min/{1.73_m2} — ABNORMAL LOW (ref >59)
Glucose: 83 mg/dL (ref 65–99)
Potassium: 4.9 mmol/L (ref 3.5–5.2)
Sodium: 140 mmol/L (ref 134–144)

## 2020-04-14 NOTE — Progress Notes (Signed)
Received 1st dose of Moderna vaccine today--unable to enter into system.  Will enter historically

## 2020-04-29 ENCOUNTER — Other Ambulatory Visit: Payer: Self-pay | Admitting: Internal Medicine

## 2020-04-29 DIAGNOSIS — Z9114 Patient's other noncompliance with medication regimen: Secondary | ICD-10-CM

## 2020-04-29 MED ORDER — FUROSEMIDE 40 MG PO TABS: 40.0000 mg | ORAL_TABLET | Freq: Every day | ORAL | 11 refills | Status: DC

## 2020-04-29 NOTE — Progress Notes (Signed)
Home visit with Erika Day, CHW Erika Day, home health aid  Arranged her pillbox to take her pre breakfast Pantoprazole in the AM boxes (no longer at night as [name] cannot watch to make sure she is taking as she comes in the morning)  Rest of meds in PM boxes, but will be taking with breakfast. Cetirizine 10 mg (needs a new bottle but reportedly has a bag of newer meds in the house somewhere  [place] will look for this and the new bag will go over the oven in a cupboard along with [name]'s meds) until she uses up old bottles, many of which date back to August and Dec of 2020.   Furosemide 40 mg  Lisinopril 10 mg  Potassium Chloride 10 mEq--also needs new bottle Miralax 17 g in 8 oz fluid and drink daily  So:  1 tab pre breakfast and 4 tabs with breakfast plus Miralax in fluid.   What is left in her box of bottles:  To be used as needed:    She has senna and Dulcolax to be used as needed for constipation. She has dicyclomine to be used as needed for bloating, though not on current med list.

## 2020-05-03 ENCOUNTER — Telehealth: Payer: Self-pay | Admitting: Internal Medicine

## 2020-05-03 NOTE — Telephone Encounter (Signed)
Ennis Forts at [facility] called stating  was changing  Ms. Stonesifer dressing today and noticed her right foot  is healing fine but is really concerned regarding second toe; she stated it is really infected and infection is closed to the bone. She stated will like Dr. Amil Amen to send her for wound care.  [place] contact number is [phone]  Please advise.

## 2020-05-03 NOTE — Telephone Encounter (Signed)
Dr. Amil Amen spoke to Erika Day- routing to her for documetation

## 2020-05-09 ENCOUNTER — Telehealth: Payer: Self-pay

## 2020-05-09 NOTE — Telephone Encounter (Addendum)
Spoke with Katharine Look. States she has been treating patients wound on right second toe and right middle toe. States now she is starting to has wounds on left toes as well. States patient either needs to see wound care or podiatry. States patients toenails need to be trimmed a well. Katharine Look is concerned patient may lose her toe. States it looks like a pressure wound or ulcer.   Spoke with Dr. Amil Amen regarding this. Okayed referral to wound care to podiatry. Wound care unable to get patient in quickly. Spoke with [facility]. Patient schedule with Dr. Posey Pronto on 05/02/2020 @ 9:30 am. Patient needs to arrive by 9:15 am. Address is [address] [address]. Patient will need to bring copy of her insurance card.   Left message for Katharine Look nurse from [facility] giving this information. Will also give information to [facility] who checks in on patient.

## 2020-05-15 ENCOUNTER — Ambulatory Visit (INDEPENDENT_AMBULATORY_CARE_PROVIDER_SITE_OTHER): Payer: Medicare Other | Admitting: Podiatry

## 2020-05-15 ENCOUNTER — Other Ambulatory Visit: Payer: Self-pay

## 2020-05-15 DIAGNOSIS — M79675 Pain in left toe(s): Secondary | ICD-10-CM

## 2020-05-15 DIAGNOSIS — B351 Tinea unguium: Secondary | ICD-10-CM | POA: Diagnosis not present

## 2020-05-15 DIAGNOSIS — L97511 Non-pressure chronic ulcer of other part of right foot limited to breakdown of skin: Secondary | ICD-10-CM | POA: Diagnosis not present

## 2020-05-15 DIAGNOSIS — M79674 Pain in right toe(s): Secondary | ICD-10-CM | POA: Diagnosis not present

## 2020-05-15 DIAGNOSIS — E1165 Type 2 diabetes mellitus with hyperglycemia: Secondary | ICD-10-CM | POA: Diagnosis not present

## 2020-05-16 ENCOUNTER — Encounter: Payer: Self-pay | Admitting: Podiatry

## 2020-05-16 NOTE — Progress Notes (Signed)
Subjective:  Patient ID: Erika Day, female    DOB: 12-Feb-1930,  MRN: [id number]  Chief Complaint  Patient presents with  . Wound Check    Bilateral 2nd digit wounds, pt states unknown duration, blood drainage.    86 y.o. female presents for wound care.  Patient presents with a complaint of right second digit ulceration that is very superficial in nature.  Patient states is does not recall how long its been present but has progressive gotten little bit worse.  Patient has not tried any kind of local wound care.  She has not seen anyone else prior to seeing me.  She does not have any vascular history.  She denies any other acute complaints.  She would like to discuss treatment options.  She also has secondary complaint of thickened elongated dystrophic toenails x10.  She would like to have them debrided down.  She denies any other acute complaints.  They are painful to touch.  Patient is a diabetic with 5.4 last A1c   Review of Systems: Negative except as noted in the HPI. Denies N/V/F/Ch.  Past Medical History:  Diagnosis Date  . Acid reflux disease   . Cancer ([place])   . CLL (chronic lymphocytic leukemia) ([place]) 05/15/2016  . DDD (degenerative disc disease), lumbar   . Degenerative joint disease   . Diabetes mellitus without complication [facility])    Patient states this was a misdiagnosis from years ago  . Dyslipidemia   . History of diabetes mellitus 04/19/2017  . Hypertension   . 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 Panic attacks   . Pneumonia 02/2016  . 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 Stroke [facility]) age 46 yo   poorly controlled DM and Hypertension  . Volvulus of sigmoid colon ([facility])     Current Outpatient Medications:  .  bisacodyl (DULCOLAX) 5 MG EC tablet, Take 5 mg by mouth daily as needed for moderate constipation., Disp: , Rfl:  .  cetirizine (ZYRTEC) 10 MG tablet, 1 tab by mouth once daily with evening meal for allergies, Disp:  30 tablet, Rfl: 11 .  dorzolamide-timolol (COSOPT) 22.3-6.8 MG/ML ophthalmic solution, 1 drop 2 (two) times daily., Disp: , Rfl:  .  furosemide (LASIX) 40 MG tablet, Take 1 tablet (40 mg total) by mouth daily., Disp: 30 tablet, Rfl: 11 .  latanoprost (XALATAN) 0.005 % ophthalmic solution, 1 drop at bedtime., Disp: , Rfl:  .  pantoprazole (PROTONIX) 40 MG tablet, 1 tab by mouth daily at 10 pm on empty stomach, Disp: 30 tablet, Rfl: 11 .  polyethylene glycol (MIRALAX / GLYCOLAX) 17 g packet, Take 17 g by mouth daily as needed., Disp: 14 each, Rfl: 0 .  potassium chloride (KLOR-CON) 10 MEQ tablet, TAKE 1 TABLET(10 MEQ) BY MOUTH DAILY, Disp: 30 tablet, Rfl: 11 .  senna-docusate (SENOKOT-S) 8.6-50 MG tablet, Take 1 tablet by mouth at bedtime as needed for mild constipation., Disp: 30 tablet, Rfl: 0 .  lisinopril (ZESTRIL) 10 MG tablet, Take 1 tablet (10 mg total) by mouth daily., Disp: 30 tablet, Rfl: 11  Social History   Tobacco Use  Smoking Status Former Smoker  . Types: Cigarettes  . Quit date: 02/18/1990  . Years since quitting: 30.2  Smokeless Tobacco Former Systems developer  . Types: Snuff  . Quit date: 02/18/1990    Allergies  Allergen Reactions  . Morphine And Related Other (See Comments)    Blood sugar dropped one-time   Objective:  There were no vitals filed for this visit. There is no height or weight on file to calculate BMI. Constitutional Well developed. Well nourished.  Vascular Dorsalis pedis pulses nonpalpable bilaterally. Posterior tibial pulsesnonm palpable bilaterally. Capillary refill normal to all digits.  No cyanosis or clubbing noted. Pedal hair growth normal.  Neurologic Normal speech. Oriented to person, place, and time. Protective sensation absent Nail Exam: Pt has thick disfigured discolored nails with subungual debris noted bilateral entire nail hallux through fifth toenails.  Pain on palpation to the nails.  Dermatologic Wound Location: R right second toe stable  with fibrotic tissue.  Does not probe to bone.  No clinical signs of infection.  No malodor present no pus present Wound Base: Mixed Granular/Fibrotic Peri-wound: Calloused Exudate: None: wound tissue dry Wound Measurements: -See below  Orthopedic: No pain to palpation either foot.   Radiographs: None Assessment:   1. Right second toe ulcer, limited to breakdown of skin ([place])   2. Pain due to onychomycosis of toenails of both feet   3. Uncontrolled type 2 diabetes mellitus with hyperglycemia ([place])    Plan:  Patient was evaluated and treated and all questions answered.  Ulcer right second toe ulcer limited to the skin -Debridement as below. -Dressed with Betadine wet-to-dry, DSD. -Continue off-loading with surgical shoe. -Given that patient's pulses are nonpalpable I believe patient will benefit from vascular study.  I will plan on obtaining ABIs PVRs and possible vascular follow-up if needed.  Procedure: Excisional Debridement of Wound Tool: Sharp chisel blade/tissue nipper Rationale: Removal of non-viable soft tissue from the wound to promote healing.  Anesthesia: none Pre-Debridement Wound Measurements: 0.5 cm x 0.3 cm x 0.1 cm  Post-Debridement Wound Measurements: 0.6 cm x 0.3 cm x 0.1 cm  Type of Debridement: Sharp Excisional Tissue Removed: Non-viable soft tissue Blood loss: Minimal (<50cc) Depth of Debridement: subcutaneous tissue. Technique: Sharp excisional debridement to bleeding, viable wound base.  Wound Progress: This is my initial evaluation.  I will continue to monitor the progression of it. Site healing conversation 7 Dressing: Dry, sterile, compression dressing. Disposition: Patient tolerated procedure well. Patient to return in 1 week for  Patient was evaluated and treated and all questions answered.  Onychomycosis with pain  -Nails palliatively debrided as below. -Educated on self-care  Procedure: Nail Debridement Rationale: pain  Type of Debridement:  manual, sharp debridement. Instrumentation: Nail nipper, rotary burr. Number of Nails: 10  Procedures and Treatment: Consent by patient was obtained for treatment procedures. The patient understood the discussion of treatment and procedures well. All questions were answered thoroughly reviewed. Debridement of mycotic and hypertrophic toenails, 1 through 5 bilateral and clearing of subungual debris. No ulceration, no infection noted.  Return Visit-Office Procedure: Patient instructed to return to the office for a follow up visit 3 months for continued evaluation and treatment.  Boneta Lucks, DPM    No follow-ups on file.  follow-up.  No follow-ups on file.

## 2020-05-20 ENCOUNTER — Telehealth: Payer: Self-pay | Admitting: *Deleted

## 2020-05-20 DIAGNOSIS — R0989 Other specified symptoms and signs involving the circulatory and respiratory systems: Secondary | ICD-10-CM

## 2020-05-20 DIAGNOSIS — L97511 Non-pressure chronic ulcer of other part of right foot limited to breakdown of skin: Secondary | ICD-10-CM

## 2020-05-20 NOTE — Telephone Encounter (Signed)
Orders faxed to [facility]. 

## 2020-05-20 NOTE — Telephone Encounter (Signed)
-----   Message from Felipa Furnace, DPM sent at 05/16/2020  2:04 PM EDT ----- Regarding: ABIs PVRs Hi Madalynn Pickelsimer,  Can you order ABIs PVRs for this patient

## 2020-05-31 ENCOUNTER — Ambulatory Visit: Payer: Medicare Other | Admitting: Podiatry

## 2020-06-04 ENCOUNTER — Telehealth: Payer: Self-pay | Admitting: Internal Medicine

## 2020-06-04 NOTE — Telephone Encounter (Signed)
Spoke with patient to get more information. States she is fine. Disregard message. States the room was too hot. Someone had cut the air off. Informed if she fells this way again to call the office back. Patient verbalized understanding.

## 2020-06-04 NOTE — Telephone Encounter (Signed)
[name] pt's daughter called stating Erika Day is having difficulties breathing when she lays down. [name] stated maybe fluid around her lungs are coming back and that is concerning her really much.  [name] will like a call back today to se what's to do.

## 2020-06-12 ENCOUNTER — Ambulatory Visit (HOSPITAL_BASED_OUTPATIENT_CLINIC_OR_DEPARTMENT_OTHER)
Admission: RE | Admit: 2020-06-12 | Discharge: 2020-06-12 | Disposition: A | Discharge status: Home / Self Care | Payer: Medicare Other | Source: Ambulatory Visit | Attending: Podiatry | Admitting: Podiatry

## 2020-06-12 ENCOUNTER — Ambulatory Visit (INDEPENDENT_AMBULATORY_CARE_PROVIDER_SITE_OTHER): Payer: Medicare Other | Admitting: Podiatry

## 2020-06-12 ENCOUNTER — Encounter (HOSPITAL_COMMUNITY): Payer: Self-pay | Admitting: *Deleted

## 2020-06-12 ENCOUNTER — Emergency Department (HOSPITAL_COMMUNITY)
Admission: EM | Admit: 2020-06-12 | Discharge: 2020-06-12 | Disposition: A | Discharge status: Home / Self Care | Payer: Medicare Other | Attending: Emergency Medicine | Admitting: Emergency Medicine

## 2020-06-12 ENCOUNTER — Encounter: Payer: Self-pay | Admitting: Internal Medicine

## 2020-06-12 ENCOUNTER — Emergency Department (HOSPITAL_COMMUNITY): Payer: Medicare Other

## 2020-06-12 ENCOUNTER — Other Ambulatory Visit: Payer: Self-pay

## 2020-06-12 ENCOUNTER — Ambulatory Visit (INDEPENDENT_AMBULATORY_CARE_PROVIDER_SITE_OTHER): Payer: Medicare Other | Admitting: Internal Medicine

## 2020-06-12 VITALS — BP 118/82 | HR 80 | Resp 12

## 2020-06-12 VITALS — BP 168/98 | HR 65 | Temp 98.4°F

## 2020-06-12 DIAGNOSIS — I130 Hypertensive heart and chronic kidney disease with heart failure and stage 1 through stage 4 chronic kidney disease, or unspecified chronic kidney disease: Secondary | ICD-10-CM | POA: Diagnosis not present

## 2020-06-12 DIAGNOSIS — I739 Peripheral vascular disease, unspecified: Secondary | ICD-10-CM

## 2020-06-12 DIAGNOSIS — R0989 Other specified symptoms and signs involving the circulatory and respiratory systems: Secondary | ICD-10-CM

## 2020-06-12 DIAGNOSIS — W050XXA Fall from non-moving wheelchair, initial encounter: Secondary | ICD-10-CM | POA: Diagnosis not present

## 2020-06-12 DIAGNOSIS — L97519 Non-pressure chronic ulcer of other part of right foot with unspecified severity: Secondary | ICD-10-CM

## 2020-06-12 DIAGNOSIS — Y998 Other external cause status: Secondary | ICD-10-CM | POA: Diagnosis not present

## 2020-06-12 DIAGNOSIS — Y9389 Activity, other specified: Secondary | ICD-10-CM | POA: Insufficient documentation

## 2020-06-12 DIAGNOSIS — Y92092 Bedroom in other non-institutional residence as the place of occurrence of the external cause: Secondary | ICD-10-CM | POA: Diagnosis not present

## 2020-06-12 DIAGNOSIS — Z9114 Patient's other noncompliance with medication regimen: Secondary | ICD-10-CM

## 2020-06-12 DIAGNOSIS — E1165 Type 2 diabetes mellitus with hyperglycemia: Secondary | ICD-10-CM

## 2020-06-12 DIAGNOSIS — M791 Myalgia, unspecified site: Secondary | ICD-10-CM | POA: Diagnosis not present

## 2020-06-12 DIAGNOSIS — R600 Localized edema: Secondary | ICD-10-CM | POA: Diagnosis not present

## 2020-06-12 DIAGNOSIS — I509 Heart failure, unspecified: Secondary | ICD-10-CM | POA: Insufficient documentation

## 2020-06-12 DIAGNOSIS — I771 Stricture of artery: Secondary | ICD-10-CM | POA: Diagnosis not present

## 2020-06-12 DIAGNOSIS — L97529 Non-pressure chronic ulcer of other part of left foot with unspecified severity: Secondary | ICD-10-CM | POA: Diagnosis not present

## 2020-06-12 DIAGNOSIS — W19XXXA Unspecified fall, initial encounter: Secondary | ICD-10-CM

## 2020-06-12 DIAGNOSIS — N1832 Chronic kidney disease, stage 3b: Secondary | ICD-10-CM | POA: Diagnosis not present

## 2020-06-12 DIAGNOSIS — R519 Headache, unspecified: Secondary | ICD-10-CM | POA: Insufficient documentation

## 2020-06-12 DIAGNOSIS — S0083XA Contusion of other part of head, initial encounter: Secondary | ICD-10-CM | POA: Diagnosis not present

## 2020-06-12 DIAGNOSIS — M25512 Pain in left shoulder: Secondary | ICD-10-CM

## 2020-06-12 DIAGNOSIS — Z79899 Other long term (current) drug therapy: Secondary | ICD-10-CM | POA: Insufficient documentation

## 2020-06-12 DIAGNOSIS — L98499 Non-pressure chronic ulcer of skin of other sites with unspecified severity: Secondary | ICD-10-CM

## 2020-06-12 DIAGNOSIS — Z87891 Personal history of nicotine dependence: Secondary | ICD-10-CM | POA: Diagnosis not present

## 2020-06-12 DIAGNOSIS — Z859 Personal history of malignant neoplasm, unspecified: Secondary | ICD-10-CM | POA: Insufficient documentation

## 2020-06-12 DIAGNOSIS — R42 Dizziness and giddiness: Secondary | ICD-10-CM | POA: Insufficient documentation

## 2020-06-12 DIAGNOSIS — L97511 Non-pressure chronic ulcer of other part of right foot limited to breakdown of skin: Secondary | ICD-10-CM | POA: Insufficient documentation

## 2020-06-12 MED ORDER — LISINOPRIL 10 MG PO TABS: 10.0000 mg | ORAL_TABLET | Freq: Every day | ORAL | 3 refills | Status: DC

## 2020-06-12 NOTE — Patient Instructions (Signed)
Home care instructions for wound:  Apply betadine ointment daily (paint/dab on wounds) and then dress with dry sterile gauze dressings lightly. Do not apply compression wraps, ACE wrap, or any constriction around the right lower extremity

## 2020-06-12 NOTE — Discharge Instructions (Addendum)
The CT scan of your brain and xray of your shoulder did not show signs of brain bleeding or fracture.  You have a bruise to your head which should get better over the next 2 weeks.  I consulted our case managers about calling [facility] tomorrow to ask about additional home health assistance.  You may need PT evaluation or longer-term home health aides.

## 2020-06-12 NOTE — ED Notes (Signed)
Pt arrives in urine filled brief that has soaked to her clothing.  Pt cleaned up and placed in fresh brief.  Placed on purewick to collect possible urine sample

## 2020-06-12 NOTE — ED Triage Notes (Signed)
Pt here from home where she fell while transferring between [facility] and bed.  Pt states that WC does not lock well and this caused fall.  Pt stuck her left shoulder when she fell and left eyebrow and back of left head.  Pt denies LOC or symptoms other than HA and left shoulder pain.  Pt lives at home with family.

## 2020-06-12 NOTE — Progress Notes (Signed)
  Subjective:  Patient ID: Erika Day, female    DOB: [birth year],  MRN: [id number]  Chief Complaint  Patient presents with  . Wound Check    R foot, 2nd toe. Pt stated, "My home health nurse comes in every few days. She said today that she wasn't satisfied with how dark the toe is. I think she saw pus. I can tell that it's draining because my shoe feels wet. I don't think she puts anything on it because the doctor wants to keep the skin dry". No fever/chills/N&V/foul odor.    86 y.o. female presents with the above complaint. History confirmed with patient. Here with her grandchildren today. Her home nurse was concerned about darkening skin.  Objective:  Physical Exam: RLE is cool to touch, punched out ulcerations on medial and lateral 2nd toe right foot, thin shiny and atrophic skin present, no signs of acute infection. Hyperpigmentation w/o gangrene  ABI: Summary:  Right: Resting right ankle-brachial index indicates noncompressible right  lower extremity arteries. The right toe-brachial index is abnormal.   Left: Resting left ankle-brachial index indicates noncompressible left  lower extremity arteries. The left toe-brachial index is abnormal.   Arterial US: Summary:  Right: Atherosclerosis in the common femoral, femoral, popliteal and  tibial arteries.  There is a 75-99% stenosis in the tibial peroneal trunk/ATA/peroneal. The  posterior tibial artery appears to have a high grade stenosis versus  occlusion.  Three vessel run off.   Left: Atherosclerosis in the common femoral, femoral, popliteal and tibial  arteries.  There is a 50-74% stenosis in the ATA.  The peroneal artery appear to be occluded.  Two vessel run off.  Unable to fully visualize the peroneal and posterior tibial arteries due  to pitting edema and patients inability to tolerate exam.  Assessment:   1. Right second toe ulcer, limited to breakdown of skin ([place])   2. Uncontrolled type 2 diabetes mellitus with  hyperglycemia ([place])   3. Peripheral arterial disease ([place])   4. Arterial insufficiency with ischemic ulcer ([place])      Plan:  Patient was evaluated and treated and all questions answered.  - She has severe PAD as noted by her mixed etiology ulcerations and non invasive vascular testing found today - She should see vascular surgery ASAP. Unclear if she would tolerate angiography given her age and comorbidities. - She is at high risk of limb loss and gangrene. I discussed this with her and her family today - our goal currently is to prevent infection and further tissue loss and hopefully her ulcers can heal if she is a candidate for revascularization - Apply betadine paint daily to ulcers and toe - Advised to keep level and do not elevate. She had compression garments on and we should d/c these to improve arterial flow. This may increase dependent edema but arterial capillary perfusion is of utmost importance currently - Return in 1 week to see Dr Erika Day, DPM 06/12/2020      Return in about 9 days (around 06/21/2020) for wound re-check.

## 2020-06-12 NOTE — ED Provider Notes (Signed)
[facility] DEPT Provider Note   CSN: [id number] Arrival date & time: 06/12/20  2041     History Chief Complaint  Patient presents with  . Fall    Erika Day is a 86 y.o. female presented emergency department a mechanical fall.  The patient reports he was try to transfer between her commode in her bed.  She said the wheelchair that she uses did not lock in place and it slid out from under her and she fell on the ground.  She struck her head on the walker or the ground.  She also reports pain in her left shoulder.  He called EMS who came and picked her up off the ground.  She complains of a mild headache and has a bump on the left side of her head.  She complains of left shoulder pain.  She denies loss of conscious.  She is not on blood thinners.  She reports she lives at her house with her husband.  They have a home health aide who comes every morning and stays till noon.  She feels like she needs more long-term assistance in the house.  She also is granddaughter checks on her regularly.  HPI     Past Medical History:  Diagnosis Date  . Acid reflux disease   . Cancer ([place])   . CLL (chronic lymphocytic leukemia) ([place]) 05/15/2016  . DDD (degenerative disc disease), lumbar   . Degenerative joint disease   . Diabetes mellitus without complication [facility])    Patient states this was a misdiagnosis from years ago  . Dyslipidemia   . History of diabetes mellitus 04/19/2017  . Hypertension   . 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 Panic attacks   . Pneumonia 02/2016  . 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 Stroke [facility]) age 45 yo   poorly controlled DM and Hypertension  . Volvulus of sigmoid colon [facility])     Patient Active Problem List   Diagnosis Date Noted  . Gastroesophageal reflux disease without esophagitis 04/10/2020  . Elevated d-dimer 03/27/2020  . Chronic kidney disease, stage 3b 03/27/2020  .  Uncontrolled type 2 diabetes mellitus with hyperglycemia ([place]) 03/27/2020  . Pressure injury of skin 03/27/2020  . Goals of care, counseling/discussion 01/23/2020  . Recurrent falls 09/05/2019  . CHF (congestive heart failure) ([place]) 06/28/2019  . MGUS (monoclonal gammopathy of unknown significance) 02/11/2018  . History of diabetes mellitus 04/19/2017  . Hypercalcemia 02/12/2017  . Chronic leukopenia 02/12/2017  . Postoperative anemia due to acute blood loss 11/26/2016  . Fall 11/23/2016  . Closed fracture of intertrochanteric section of femur ([place]) 11/23/2016  . Pancytopenia ([place]) 11/13/2016  . Sinusitis 10/14/2016  . Bleeding external hemorrhoids 08/20/2016  . Recurrent ventral incisional hernia s/p primary repair 06/30/2016 07/01/2016  . Bilateral leg edema 05/20/2016  . Protein-calorie malnutrition, severe ([place]) 05/20/2016  . Sigmoid volvulus s/p lap sigmoid colectomy 06/30/2016 05/19/2016  . 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 Constipation 05/15/2016  . Other fatigue 05/15/2016  . Anemia 02/13/2016  . Thrombocytopenia ([place]) 02/13/2016  . Kyphosis 02/01/2016  . Pressure ulcer of coccygeal region 01/31/2016  . Mobility impaired 01/31/2016  . Osteoarthritis 01/31/2016  . Abdominal pain 04/23/2014  . Essential hypertension 09/06/2011  . Ovarian torsion 08/25/2011    Past Surgical History:  Procedure Laterality Date  . ABDOMINAL HYSTERECTOMY  ?  08/25/2011 ?   Has BSO for left benign ovarian tumor with torsion and broad ligament benign tumor, but does not appear had hysterectomy--[facility], NOT ovarian cancer  . CHOLECYSTECTOMY    . FEMUR IM NAIL Left 11/23/2016   Procedure: INTRAMEDULLARY (IM) NAIL FEMORAL;  Surgeon: Gaynelle Arabian, MD;  Location: [facility];  Service: Orthopedics;  Laterality: Left;  . FLEXIBLE SIGMOIDOSCOPY N/A 05/19/2016   Procedure: FLEXIBLE SIGMOIDOSCOPY;  Surgeon: [name], MD;   Location: [facility];  Service: Endoscopy;  Laterality: N/A;  . FLEXIBLE SIGMOIDOSCOPY N/A 06/28/2016   Procedure: FLEXIBLE SIGMOIDOSCOPY;  Surgeon: Ladene Artist, MD;  Location: [facility];  Service: Endoscopy;  Laterality: N/A;  . LAPAROSCOPIC SIGMOID COLECTOMY N/A 06/30/2016   Procedure: LAPAROSCOPIC SIGMOID COLECTOMY,rigid sigmoidoscopy, repair recurrent incisional hernia;  Surgeon: Michael Boston, MD;  Location: [facility];  Service: General;  Laterality: N/A;  . LAPAROTOMY N/A 04/29/2014   Procedure: EXPLORATORY LAPAROTOMY;  Surgeon: Imogene Burn. Georgette Dover, MD;  Location: [facility];  Service: General;  Laterality: N/A;  . LAPAROTOMY N/A 04/16/2018   Procedure: EXPLORATORY LAPAROTOMY WITH LYSIS OF ADHESIONS;  Surgeon: Armandina Gemma, MD;  Location: [facility];  Service: General;  Laterality: N/A;  . LYSIS OF ADHESION N/A 04/29/2014   Procedure: EXTENSIVE LYSIS OF ADHESIONS;  Surgeon: Imogene Burn. Georgette Dover, MD;  Location: [place];  Service: General;  Laterality: N/A;  . VENTRAL HERNIA REPAIR N/A 04/29/2014   Procedure: PRIMARY REPAIR OF VENTRAL HERNIA;  Surgeon: Imogene Burn. Georgette Dover, MD;  Location: [facility] OR;  Service: General;  Laterality: N/A;     OB History   No obstetric history on file.     Family History  Problem Relation Age of Onset  . Hypertension Mother   . Stroke Mother   . Arthritis Mother   . Alcohol abuse Father     Social History   Tobacco Use  . Smoking status: Former Smoker    Types: Cigarettes    Quit date: 02/18/1990    Years since quitting: 30.3  . Smokeless tobacco: Former Systems developer    Types: Snuff    Quit date: 02/18/1990  Substance Use Topics  . Alcohol use: No    Alcohol/week: 0.0 standard drinks    Comment: Only drank alcohol between ages of 56 and 45 yo.  Her father gave it to her  . Drug use: No    Home Medications Prior to Admission medications   Medication Sig Start Date End Date Taking? Authorizing Provider  bisacodyl (DULCOLAX) 5 MG EC tablet Take 5 mg by mouth daily as needed  for moderate constipation.    [provider]  cetirizine (ZYRTEC) 10 MG tablet 1 tab by mouth once daily with evening meal for allergies 10/06/19   Mack Hook, MD  dorzolamide-timolol (COSOPT) 22.3-6.8 MG/ML ophthalmic solution 1 drop 2 (two) times daily. 04/12/20   [provider]  furosemide (LASIX) 40 MG tablet Take 1 tablet (40 mg total) by mouth daily. 04/29/20 10/26/20  Mack Hook, MD  latanoprost (XALATAN) 0.005 % ophthalmic solution 1 drop at bedtime. 04/12/20   [provider]  lisinopril (ZESTRIL) 10 MG tablet Take 1 tablet (10 mg total) by mouth daily. 06/12/20   Mack Hook, MD  pantoprazole (PROTONIX) 40 MG tablet 1 tab by mouth daily at 10 pm on empty stomach 10/06/19   Mack Hook, MD  polyethylene glycol (MIRALAX / GLYCOLAX) 17 g packet Take 17 g by mouth daily as needed. Patient not taking: Reported on 06/12/2020 03/31/20   Gerlean Ren  Chirag, MD  potassium chloride (KLOR-CON) 10 MEQ tablet TAKE 1 TABLET(10 MEQ) BY MOUTH DAILY 03/15/20   Mack Hook, MD  senna-docusate (SENOKOT-S) 8.6-50 MG tablet Take 1 tablet by mouth at bedtime as needed for mild constipation. 03/31/20   Damita Lack, MD    Allergies    Morphine and related  Review of Systems   Review of Systems  Constitutional: Negative for chills and fever.  HENT: Negative for ear pain and sore throat.   Eyes: Negative for pain and visual disturbance.  Respiratory: Negative for cough and shortness of breath.   Cardiovascular: Negative for chest pain and palpitations.  Gastrointestinal: Negative for abdominal pain and vomiting.  Genitourinary: Negative for dysuria and hematuria.  Musculoskeletal: Positive for arthralgias and myalgias.  Skin: Negative for color change and rash.  Neurological: Positive for light-headedness and headaches. Negative for syncope and weakness.  Psychiatric/Behavioral: Negative for agitation and confusion.  All other systems  reviewed and are negative.   Physical Exam Updated Vital Signs BP (!) 161/71 (BP Location: Left Arm)   Pulse 81   Temp 97.9 F (36.6 C) (Oral)   Resp 18   SpO2 100%   Physical Exam Vitals and nursing note reviewed.  Constitutional:      General: She is not in acute distress.    Appearance: She is well-developed.  HENT:     Head: Normocephalic.     Comments: Hematoma to left scalp Eyes:     Conjunctiva/sclera: Conjunctivae normal.  Cardiovascular:     Rate and Rhythm: Normal rate and regular rhythm.     Pulses: Normal pulses.  Pulmonary:     Effort: Pulmonary effort is normal. No respiratory distress.     Breath sounds: Normal breath sounds.  Abdominal:     General: There is no distension.     Palpations: Abdomen is soft.     Tenderness: There is no abdominal tenderness.  Musculoskeletal:     Cervical back: Neck supple.     Comments: Left shoulder pain reported with no gross deformity of the shoulder Able to actively range shoulder without significant discomfort No other deformities, swelling, or pain of the extremities or hips on ROM testing  Skin:    General: Skin is warm and dry.  Neurological:     General: No focal deficit present.     Mental Status: She is alert and oriented to person, place, and time.     Comments: No spinal midline tenderness     ED Results / Procedures / Treatments   Labs (all labs ordered are listed, but only abnormal results are displayed) Labs Reviewed - No data to display  EKG None  Radiology CT Head Wo Contrast  Result Date: 06/12/2020 CLINICAL DATA:  Head trauma, scalp hematoma, fall EXAM: CT HEAD WITHOUT CONTRAST TECHNIQUE: Contiguous axial images were obtained from the base of the skull through the vertex without intravenous contrast. COMPARISON:  None. FINDINGS: Brain: Mild to moderate parenchymal volume loss is commensurate with the patient's age. Mild periventricular white matter changes are present likely reflecting the  sequela of small vessel ischemia. Remote lacunar infarct noted within the right basal ganglia. No abnormal intra or extra-axial mass lesion or fluid collection. No abnormal mass effect or midline shift. No evidence of acute intracranial hemorrhage or infarct. Ventricle size is normal. Cerebellum unremarkable. Vascular: Extensive atherosclerotic calcifications seen within the carotid siphons. No asymmetric hyperdense vasculature at the skull base. Skull: Intact. Sinuses/Orbits: Paranasal sinuses are clear. Orbits are unremarkable. Other:  Mastoid air cells and middle ear cavities are clear. Small left frontal scalp hematoma noted. IMPRESSION: 1. No acute intracranial abnormality. 2. Small left frontal scalp hematoma. Electronically Signed   By: Fidela Salisbury MD   On: 06/12/2020 21:44   DG Shoulder Left  Result Date: 06/12/2020 CLINICAL DATA:  86 year old female with fall and left shoulder pain EXAM: LEFT SHOULDER - 2+ VIEW COMPARISON:  None. FINDINGS: There is no acute fracture or dislocation the bones are osteopenic. There is degenerative changes of the left shoulder. The soft tissues are unremarkable. Atherosclerotic calcification of the visualized aortic arch. IMPRESSION: No acute fracture or dislocation. Electronically Signed   By: Anner Crete M.D.   On: 06/12/2020 21:36   LE ART SEG MULTI (Segm & LE [place])  Result Date: 06/12/2020 LOWER EXTREMITY DOPPLER STUDY Indications: Ulceration, and peripheral artery disease. High Risk Factors: Hypertension, hyperlipidemia, Diabetes, past history of                    smoking, prior CVA. Other Factors: Patient presents with a complaint of right second digit                ulceration that is very superficial in nature. Patient is                non-ambulatory.  Limitations: Today's exam was limited due to patient positioning and bandages. Performing Technologist: Wilkie Aye RVT  Examination Guidelines: A complete evaluation includes at minimum, Doppler  waveform signals and systolic blood pressure reading at the level of bilateral brachial, anterior tibial, and posterior tibial arteries, when vessel segments are accessible. Bilateral testing is considered an integral part of a complete examination. Photoelectric Plethysmograph (PPG) waveforms and toe systolic pressure readings are included as required and additional duplex testing as needed. Limited examinations for reoccurring indications may be performed as noted.  ABI Findings: +---------+------------------+-----+----------+--------+ Right    Rt Pressure (mmHg)IndexWaveform  Comment  +---------+------------------+-----+----------+--------+ Brachial 142                                       +---------+------------------+-----+----------+--------+ CFA                             triphasic          +---------+------------------+-----+----------+--------+ Popliteal                       biphasic           +---------+------------------+-----+----------+--------+ ATA      255               1.66 monophasic         +---------+------------------+-----+----------+--------+ PTA      255               1.66 monophasic         +---------+------------------+-----+----------+--------+ PERO     255               1.66 monophasic         +---------+------------------+-----+----------+--------+ Great Toe60                0.39 Abnormal           +---------+------------------+-----+----------+--------+ +---------+------------------+-----+----------+-------+ Left     Lt Pressure (mmHg)IndexWaveform  Comment +---------+------------------+-----+----------+-------+ Brachial 154                                      +---------+------------------+-----+----------+-------+  CFA                             triphasic         +---------+------------------+-----+----------+-------+ ATA      255               1.66 monophasic         +---------+------------------+-----+----------+-------+ PTA      255               1.66 monophasic        +---------+------------------+-----+----------+-------+ PERO     255               1.66 monophasic        +---------+------------------+-----+----------+-------+ Great Toe0                 0.00 Abnormal          +---------+------------------+-----+----------+-------+ +-------+-----------+-----------+------------+------------+ ABI/TBIToday's ABIToday's TBIPrevious ABIPrevious TBI +-------+-----------+-----------+------------+------------+ Right  [place]         .39                                 +-------+-----------+-----------+------------+------------+ Left   [place]         0                                   +-------+-----------+-----------+------------+------------+ Unable to obtain left popliteal waveform due to patients discomfort and inability to hold position.  Summary: Right: Resting right ankle-brachial index indicates noncompressible right lower extremity arteries. The right toe-brachial index is abnormal. Left: Resting left ankle-brachial index indicates noncompressible left lower extremity arteries. The left toe-brachial index is abnormal.  *See table(s) above for measurements and observations.  Vascular consult recommended. Electronically signed by Larae Grooms MD on 06/12/2020 at 5:22:42 PM.    Final    VAS [place] LOWER EXTREMITY ARTERIAL DUPLEX  Result Date: 06/12/2020 LOWER EXTREMITY ARTERIAL DUPLEX STUDY Indications: Ulceration, and peripheral artery disease. High Risk Factors: Hypertension, hyperlipidemia, Diabetes, past history of                    smoking, prior CVA. Other Factors: Patient presents with a complaint of right second digit                ulceration that is very superficial in nature. Patient is                non-ambulatory.  Current ABI: Today, the ABI's were non compressible bilaterally. Limitations: Technically challenging study due to  bandages, pitting edema and              patients inability to reposition. Performing Technologist: Wilkie Aye RVT  Examination Guidelines: A complete evaluation includes B-mode imaging, spectral Doppler, color Doppler, and power Doppler as needed of all accessible portions of each vessel. Bilateral testing is considered an integral part of a complete examination. Limited examinations for reoccurring indications may be performed as noted.  +----------+--------+-----+---------------+----------+--------+ RIGHT     PSV cm/sRatioStenosis       Waveform  Comments +----------+--------+-----+---------------+----------+--------+ CFA Prox  72                          triphasic          +----------+--------+-----+---------------+----------+--------+ DFA  56                          triphasic          +----------+--------+-----+---------------+----------+--------+ SFA Prox  60                          triphasic          +----------+--------+-----+---------------+----------+--------+ SFA Mid   52                          triphasic          +----------+--------+-----+---------------+----------+--------+ SFA Distal76                          triphasic          +----------+--------+-----+---------------+----------+--------+ POP Prox  38                          biphasic           +----------+--------+-----+---------------+----------+--------+ POP Distal42                          monophasic         +----------+--------+-----+---------------+----------+--------+ TP Trunk  34                          biphasic           +----------+--------+-----+---------------+----------+--------+ ATA Prox  157     4.6  75-99% stenosis                   +----------+--------+-----+---------------+----------+--------+ ATA Mid   39                          monophasic         +----------+--------+-----+---------------+----------+--------+ PTA Prox  24                           monophasic         +----------+--------+-----+---------------+----------+--------+ PTA Distal32                          monophasic         +----------+--------+-----+---------------+----------+--------+ PERO Prox 189     5.6  75-99% stenosis                   +----------+--------+-----+---------------+----------+--------+ PERO Mid  24                          monophasic         +----------+--------+-----+---------------+----------+--------+ A focal velocity elevation of 157 cm/s was obtained at ATA with a VR of 4.6. Findings are characteristic of 75-99% stenosis. A 2nd focal velocity elevation was visualized, measuring 189 cm/s at peroneal with a VR of 5.6.  +----------+--------+-----+---------------+----------+--------+ LEFT      PSV cm/sRatioStenosis       Waveform  Comments +----------+--------+-----+---------------+----------+--------+ CFA Prox  96                          triphasic          +----------+--------+-----+---------------+----------+--------+ DFA       47  biphasic           +----------+--------+-----+---------------+----------+--------+ SFA Prox  87                          biphasic           +----------+--------+-----+---------------+----------+--------+ SFA Mid   88                          monophasic         +----------+--------+-----+---------------+----------+--------+ SFA Distal100                         monophasic         +----------+--------+-----+---------------+----------+--------+ POP Prox  73                          monophasic         +----------+--------+-----+---------------+----------+--------+ POP Distal34                          monophasic         +----------+--------+-----+---------------+----------+--------+ TP Trunk  100                                            +----------+--------+-----+---------------+----------+--------+ ATA Prox  230     2.3   50-74% stenosismonophasic         +----------+--------+-----+---------------+----------+--------+ PTA Mid   60                          monophasic         +----------+--------+-----+---------------+----------+--------+ A focal velocity elevation of 230 cm/s was obtained at ATA with a VR of 2.3. Findings are characteristic of 50-74% stenosis.  Summary: Right: Atherosclerosis in the common femoral, femoral, popliteal and tibial arteries. There is a 75-99% stenosis in the tibial peroneal trunk/ATA/peroneal. The posterior tibial artery appears to have a high grade stenosis versus occlusion. Three vessel run off. Left: Atherosclerosis in the common femoral, femoral, popliteal and tibial arteries. There is a 50-74% stenosis in the ATA. The peroneal artery appear to be occluded. Two vessel run off. Unable to fully visualize the peroneal and posterior tibial arteries due to pitting edema and patients inability to tolerate exam.  See table(s) above for measurements and observations. Vascular consult recommended. Electronically signed by Larae Grooms MD on 06/12/2020 at 5:24:51 PM.    Final     Procedures Procedures (including critical care time)  Medications Ordered in ED Medications - No data to display  ED Course  I have reviewed the triage vital signs and the nursing notes.  Pertinent labs & imaging results that were available during my care of the patient were reviewed by me and considered in my medical decision making (see chart for details).  86 yo female here with mechanical fall at home, hematoma to head, and shoulder pain  CTH obtained with acute findings of ICH Xray shoulder without acute fracture  No other evidence of acute injuries on exam.  No spinal pain or tenderness.  No hip tenderness.    We'll discharge home with granddaughter.  I placed TOC consult regarding patient's request for additional home health needs.  She has part-time assistance now.   Final Clinical  Impression(s) /  ED Diagnoses Final diagnoses:  Fall, initial encounter  Contusion of face, initial encounter  Acute pain of left shoulder    Rx / DC Orders ED Discharge Orders    None       Previn Jian, Carola Rhine, MD 06/13/20 [phone]

## 2020-06-12 NOTE — ED Notes (Signed)
Call to [facility] to transport pt back home

## 2020-06-12 NOTE — Progress Notes (Signed)
Subjective:    Patient ID: Erika Day, female   DOB: [birth year], 86 y.o.   MRN: [id number]   HPI   Patient here for follow up, but office received a call from her [facility] home health nurse, Katharine Look, who was in the home seeing her for dressing change today of right foot.  Katharine Look states her 2nd toe is turning dark and now also with more swelling of her left foot as well with skin breakdown and drainage of toes on that side.  Katharine Look placed a dressing on that foot as well.  Pillbox is not set up as discussed with prolonged home visit back on June 28th.  All of her pills, including the Pantoprazole are in the AM boxes.  Appears she missed her Monday meds.  She is able to identify the Pantoprazole and states she is taking 30 minutes before breakfast.  Takes the rest of pills with breakfast.  [employer], CHW, in charge of filling pillbox each week.  Encouraged her in future to let me know there is a problem with how Ms. Eley and her family are doing things when the problems occur rather than when she develops complications from not following through as planned.  She has been aware that the pillbox was not set up the way decided back in June.  States she thought Ms. Augustine was taking Pantoprazole at bedtime.    Called  E[facility] Mines, her home health aid.  She is not checking off when she gives meds in the spiral notebook I set up each month as decided also back in June.  Her medication list with instructions is not in her bottled medication bag.  This Med list also lists what person is responsible for what aspect of Ms. Jury's care.   Ms. Royce Macadamia was asked to find this med list as the one in Ms. Bertelson's bag is from 10/2019 and has none of the instructions listed. She was also asked to start checking off the meds. She was also asked that in the future to notify me if Ms. Peavy is not following instructions with elevation of legs. Ms. Robicheaux daughter states she is not sure Ms. Esmeralda Links is the  right person for the job.  Reiterated would encourage the family to find a new health aide as we have discussed multiple times in the past.  Ms. Guerry admits she is once again not elevating her legs and is eating high sodium snacks when her stomach is upset and she is not hungry.  Not elevating legs as she states she gets leg cramps.  Sounds like she does not drink a lot of water during the day.  Her husband,  [name], reportedly not willing to help her with pillows under her knees and lower legs to avoid leg cramps.  She does have a hospital bed as well to better position legs.  She is having some pain in her 2nd toe on the right and at the plantar base of her 3 middle toes of left foot.  No fever.  Daughter states the toes "are going the wrong way"    Current Meds  Medication Sig  . cetirizine (ZYRTEC) 10 MG tablet 1 tab by mouth once daily with evening meal for allergies  . furosemide (LASIX) 40 MG tablet Take 1 tablet (40 mg total) by mouth daily.  Marland Kitchen lisinopril (ZESTRIL) 10 MG tablet Take 1 tablet (10 mg total) by mouth daily.  . pantoprazole (PROTONIX) 40 MG tablet 1  tab by mouth daily at 10 pm on empty stomach  . potassium chloride (KLOR-CON) 10 MEQ tablet TAKE 1 TABLET(10 MEQ) BY MOUTH DAILY  . [DISCONTINUED] lisinopril (ZESTRIL) 10 MG tablet Take 10 mg by mouth daily.     Allergies  Allergen Reactions  . Morphine And Related Other (See Comments)    Blood sugar dropped one-time     Review of Systems    Objective:   BP 118/82 (BP Location: Left Arm, Patient Position: Sitting, Cuff Size: Normal)   Pulse 80   Resp 12   Physical Exam  NAD Kyphotic Lungs:  Dry crackles at bases, clear otherwise CV:  RRR with Grade III/VI SEM unchanged Abd:  S, NT, No HSM or mass, + BS LE:  Left with increased edema.  Skin of feet with flaking bilaterally.  Medial right great toe and second toe with darkening.  Mildly tender to touch.  No fluctuance.  Left middle toes all swollen with some  skin breakdown in 2 spots on different toes and scant yellowish discharge.  Tender at plantar base of all three toes.  No fluctuance of these toes as well.   Assessment & Plan   1.  Possible infection of toes with concern for ischemia as well:  Looking at her chart,  Dr. Posey Pronto, Podiatry, has her set up with a LE arterial study today at 2 p.m. with cardiology.  Patient and daughter were unaware of the appt.  They will be able to make this, however.   We have arranged to have podiatry take a look after the procedure today and see if they recommend care as an outpatient vs inpatient.  Would like to keep her out with COVID increasing if possible.  She is vaccinated. Patient agrees to elevate her legs as she knows she is supposed to.  Daughter has been instructed again on what needs to happen to monitor the situation. Not clear if they will consider working with another health aid, though the patient and family need to be doing more to prevent problems as well.  Discussed again why it is so important for her to not dangle her legs when sleeping at night in particular, but to also elevate legs during the day.

## 2020-06-13 ENCOUNTER — Telehealth: Payer: Self-pay | Admitting: *Deleted

## 2020-06-13 ENCOUNTER — Other Ambulatory Visit: Payer: Self-pay | Admitting: Internal Medicine

## 2020-06-13 DIAGNOSIS — I739 Peripheral vascular disease, unspecified: Secondary | ICD-10-CM

## 2020-06-13 DIAGNOSIS — L97511 Non-pressure chronic ulcer of other part of right foot limited to breakdown of skin: Secondary | ICD-10-CM

## 2020-06-13 DIAGNOSIS — E1165 Type 2 diabetes mellitus with hyperglycemia: Secondary | ICD-10-CM

## 2020-06-13 NOTE — Telephone Encounter (Signed)
Faxed required form, referral, demographics and testing with clinicals to [facility].

## 2020-06-13 NOTE — Progress Notes (Signed)
Dr. Maxie Barb note reviewed from yesterday with results of arterial evaluation of LE showing severe PAD.   Not clear if referral made to [facility].   Not sure what procedure she will tolerate with her comorbidities, but needs assessment and options. Will go ahead with that. Patient currently in ED following a fall at home after her evaluation.   Apparently asking for more assistance at home.  Have tried to encourage her to think about [facility] in past, but has not been interested.

## 2020-06-13 NOTE — Telephone Encounter (Signed)
-----   Message from Criselda Peaches, DPM sent at 06/12/2020 10:02 PM EDT ----- Regarding: [facility] Rerferral - STAT Hi Val,  I saw this patient today for Dr Posey Pronto. Any chance we can send an urgent referral for [name] to [facility]? She had ABI and US done today and she has early gangrene and ulcers with severe PAD. If they're able to see her in the next week or so that would be great. I'm worried this will continue to worsen and she's at high risk of major amputation.  Thanks! Quita Skye

## 2020-06-13 NOTE — Telephone Encounter (Signed)
Team Chat - C. Hall request additional contact information.

## 2020-06-13 NOTE — Progress Notes (Signed)
06/13/2020 658 pm TOC CM contacted pt and husband states pt was sleep. Requested CM give call back tomorrow. Pt had HH in the past but he is not sure what agency. [place], [place] ED TOC CM [phone]

## 2020-06-14 ENCOUNTER — Encounter: Payer: Self-pay | Admitting: Vascular Surgery

## 2020-06-14 ENCOUNTER — Inpatient Hospital Stay (HOSPITAL_COMMUNITY): Admission: RE | Admit: 2020-06-14 | Payer: Medicare Other | Source: Ambulatory Visit

## 2020-06-14 ENCOUNTER — Ambulatory Visit (INDEPENDENT_AMBULATORY_CARE_PROVIDER_SITE_OTHER): Payer: Medicare Other | Admitting: Vascular Surgery

## 2020-06-14 ENCOUNTER — Other Ambulatory Visit: Payer: Self-pay

## 2020-06-14 ENCOUNTER — Ambulatory Visit: Payer: Medicare Other | Admitting: Podiatry

## 2020-06-14 ENCOUNTER — Telehealth: Payer: Self-pay

## 2020-06-14 VITALS — BP 103/65 | HR 77 | Temp 98.3°F | Resp 20 | Ht 62.0 in | Wt 156.0 lb

## 2020-06-14 DIAGNOSIS — I739 Peripheral vascular disease, unspecified: Secondary | ICD-10-CM | POA: Diagnosis not present

## 2020-06-14 NOTE — Telephone Encounter (Signed)
Pts aid [name] called to advise that pt was unable to get Covid test completed today for procedure on Monday because unable to locate facility. Attempted to r/s pt for tomorrow for Covid test. [name] stated she will call back to schedule after speaking with pts family.

## 2020-06-14 NOTE — Telephone Encounter (Signed)
[name] (Patient's CNA) called stating they were unable to find the [facility] testing site in [place] and while driving started having car trouble so they went home. She states they spoke with someone in office that advised patient could go get her COVID test tomorrow and that they need additional information. I advised that I will get the message to our surgery Scheduler Kea,RN and she would return their call. [place] notified.

## 2020-06-14 NOTE — Progress Notes (Signed)
Patient ID: Erika Day, female   DOB: December 04, 1929, 86 y.o.   MRN: [id number]  Reason for Consult: New Patient (Initial Visit)   Referred by Mack Hook, MD  Subjective:     HPI:  Erika Day is a 86 y.o. female without previous vascular history.  Has been seen by podiatry for right second toe ulceration.  She states that she does not know how this happened.  She does wear a heel weightbearing shoe on the left has been wearing a sandal on the right foot with the dressing on the right foot.  She does have an aide which helps her with dressing changes she has been wearing an Ace wrap on the right leg as well.  She does not have any pain in her feet.  She is confined to wheelchair but does use both of her legs to transfer and to get around.  She does not take aspirin or statin.  Past Medical History:  Diagnosis Date  . Acid reflux disease   . Cancer ([place])   . CLL (chronic lymphocytic leukemia) ([place]) 05/15/2016  . DDD (degenerative disc disease), lumbar   . Degenerative joint disease   . Diabetes mellitus without complication [facility])    Patient states this was a misdiagnosis from years ago  . Dyslipidemia   . History of diabetes mellitus 04/19/2017  . Hypertension   . 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 Panic attacks   . Pneumonia 02/2016  . 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 Stroke [facility]) age 16 yo   poorly controlled DM and Hypertension  . Volvulus of sigmoid colon ([facility])    Family History  Problem Relation Age of Onset  . Hypertension Mother   . Stroke Mother   . Arthritis Mother   . Alcohol abuse Father    Past Surgical History:  Procedure Laterality Date  . ABDOMINAL HYSTERECTOMY  ? 08/25/2011 ?   Has BSO for left benign ovarian tumor with torsion and broad ligament benign tumor, but does not appear had hysterectomy--[facility], NOT ovarian cancer  . CHOLECYSTECTOMY    . FEMUR IM NAIL Left 11/23/2016   Procedure:  INTRAMEDULLARY (IM) NAIL FEMORAL;  Surgeon: Gaynelle Arabian, MD;  Location: [facility];  Service: Orthopedics;  Laterality: Left;  . FLEXIBLE SIGMOIDOSCOPY N/A 05/19/2016   Procedure: FLEXIBLE SIGMOIDOSCOPY;  Surgeon: [name], MD;  Location: [facility];  Service: Endoscopy;  Laterality: N/A;  . FLEXIBLE SIGMOIDOSCOPY N/A 06/28/2016   Procedure: FLEXIBLE SIGMOIDOSCOPY;  Surgeon: Ladene Artist, MD;  Location: [facility];  Service: Endoscopy;  Laterality: N/A;  . LAPAROSCOPIC SIGMOID COLECTOMY N/A 06/30/2016   Procedure: LAPAROSCOPIC SIGMOID COLECTOMY,rigid sigmoidoscopy, repair recurrent incisional hernia;  Surgeon: Michael Boston, MD;  Location: [facility];  Service: General;  Laterality: N/A;  . LAPAROTOMY N/A 04/29/2014   Procedure: EXPLORATORY LAPAROTOMY;  Surgeon: Imogene Burn. Georgette Dover, MD;  Location: [place];  Service: General;  Laterality: N/A;  . LAPAROTOMY N/A 04/16/2018   Procedure: EXPLORATORY LAPAROTOMY WITH LYSIS OF ADHESIONS;  Surgeon: Armandina Gemma, MD;  [facility];  Service: General;  Laterality: N/A;  . LYSIS OF ADHESION N/A 04/29/2014   Procedure: EXTENSIVE LYSIS OF ADHESIONS;  Surgeon: Imogene Burn. Georgette Dover, MD;  Location: [place];  Service: General;  Laterality: N/A;  . VENTRAL HERNIA REPAIR N/A 04/29/2014   Procedure: PRIMARY REPAIR OF VENTRAL HERNIA;  Surgeon: Imogene Burn. Georgette Dover, MD;  Location: [place];  Service: General;  Laterality: N/A;  Short Social History:  Social History   Tobacco Use  . Smoking status: Former Smoker    Types: Cigarettes    Quit date: 02/18/1990    Years since quitting: 30.3  . Smokeless tobacco: Former Systems developer    Types: Snuff    Quit date: 02/18/1990  Substance Use Topics  . Alcohol use: No    Alcohol/week: 0.0 standard drinks    Comment: Only drank alcohol between ages of 45 and 36 yo.  Her father gave it to her    Allergies  Allergen Reactions  . Morphine And Related Other (See Comments)    Blood sugar dropped one-time    Current Outpatient Medications    Medication Sig Dispense Refill  . bisacodyl (DULCOLAX) 5 MG EC tablet Take 5 mg by mouth daily as needed for moderate constipation.    . cetirizine (ZYRTEC) 10 MG tablet 1 tab by mouth once daily with evening meal for allergies 30 tablet 11  . dorzolamide-timolol (COSOPT) 22.3-6.8 MG/ML ophthalmic solution 1 drop 2 (two) times daily.    . furosemide (LASIX) 40 MG tablet Take 1 tablet (40 mg total) by mouth daily. 30 tablet 11  . latanoprost (XALATAN) 0.005 % ophthalmic solution 1 drop at bedtime.    Marland Kitchen lisinopril (ZESTRIL) 10 MG tablet Take 1 tablet (10 mg total) by mouth daily. 90 tablet 3  . pantoprazole (PROTONIX) 40 MG tablet 1 tab by mouth daily at 10 pm on empty stomach 30 tablet 11  . polyethylene glycol (MIRALAX / GLYCOLAX) 17 g packet Take 17 g by mouth daily as needed. 14 each 0  . potassium chloride (KLOR-CON) 10 MEQ tablet TAKE 1 TABLET(10 MEQ) BY MOUTH DAILY 30 tablet 11  . senna-docusate (SENOKOT-S) 8.6-50 MG tablet Take 1 tablet by mouth at bedtime as needed for mild constipation. 30 tablet 0   No current facility-administered medications for this visit.    Review of Systems  Constitutional:  Constitutional negative. HENT: HENT negative.  Eyes: Eyes negative.  Respiratory: Respiratory negative.  Cardiovascular: Cardiovascular negative.  GI: Gastrointestinal negative.  Musculoskeletal: Positive for gait problem.  Skin: Positive for wound.  Hematologic: Hematologic/lymphatic negative.  Psychiatric: Psychiatric negative.        Objective:  Objective  Vitals:   06/14/20 1205  BP: 103/65  Pulse: 77  Resp: 20  Temp: 98.3 F (36.8 C)  SpO2: 96%    Physical Exam Constitutional:      Comments: In wheelchair  HENT:     Head: Normocephalic.  Cardiovascular:     Rate and Rhythm: Normal rate.     Pulses:          Radial pulses are 2+ on the right side and 2+ on the left side.       Femoral pulses are 2+ on the right side and 2+ on the left side.      Popliteal  pulses are 0 on the right side and 0 on the left side.       Dorsalis pedis pulses are detected w/ Doppler on the right side and detected w/ Doppler on the left side.       Posterior tibial pulses are detected w/ Doppler on the right side and detected w/ Doppler on the left side.  Pulmonary:     Effort: Pulmonary effort is normal.  Abdominal:     General: Abdomen is flat.     Palpations: Abdomen is soft. There is no mass.  Musculoskeletal:  General: Normal range of motion.     Cervical back: Normal range of motion.     Right lower leg: No edema.     Left lower leg: Edema present.  Skin:    Capillary Refill: Capillary refill takes 2 to 3 seconds.     Comments: The right second toe was discolored with small ulcerations  Neurological:     General: No focal deficit present.     Mental Status: She is alert.  Psychiatric:        Mood and Affect: Mood normal.        Thought Content: Thought content normal.        Judgment: Judgment normal.     Data: ABI Findings:  +---------+------------------+-----+----------+--------+  Right  Rt Pressure (mmHg)IndexWaveform Comment   +---------+------------------+-----+----------+--------+  Brachial 142                      +---------+------------------+-----+----------+--------+  CFA               triphasic       +---------+------------------+-----+----------+--------+  Popliteal            biphasic       +---------+------------------+-----+----------+--------+  ATA   255        1.66 monophasic      +---------+------------------+-----+----------+--------+  PTA   255        1.66 monophasic      +---------+------------------+-----+----------+--------+  PERO   255        1.66 monophasic      +---------+------------------+-----+----------+--------+  Great Toe60        0.39 Abnormal         +---------+------------------+-----+----------+--------+   +---------+------------------+-----+----------+-------+  Left   Lt Pressure (mmHg)IndexWaveform Comment  +---------+------------------+-----+----------+-------+  Brachial 154                      +---------+------------------+-----+----------+-------+  CFA               triphasic       +---------+------------------+-----+----------+-------+  ATA   255        1.66 monophasic      +---------+------------------+-----+----------+-------+  PTA   255        1.66 monophasic      +---------+------------------+-----+----------+-------+  PERO   255        1.66 monophasic      +---------+------------------+-----+----------+-------+  Great Toe0         0.00 Abnormal       +---------+------------------+-----+----------+-------+   +-------+-----------+-----------+------------+------------+  ABI/TBIToday's ABIToday's TBIPrevious ABIPrevious TBI  +-------+-----------+-----------+------------+------------+  Right [place]     .39                  +-------+-----------+-----------+------------+------------+  Left  [place]     0                   +-------+-----------+-----------+------------+------------+    Unable to obtain left popliteal waveform due to patients discomfort and  inability to hold position.    Summary:  Right: Resting right ankle-brachial index indicates noncompressible right  lower extremity arteries. The right toe-brachial index is abnormal.   Left: Resting left ankle-brachial index indicates noncompressible left  lower extremity arteries. The left toe-brachial index is abnormal.      Assessment/Plan:     86 year old female with right second toe early gangrenous changes.  She has monophasic signals throughout her bilateral lower extremities  decreased toe pressure.  There are no palpable pulses are strong signals bilateral R  posterior tibial.  We will begin with angiography from the left common femoral approach to possibly treat the right lower extremity.  Will possibly need CO2 depending on her creatinine at the time of procedure.     Waynetta Sandy MD [facility]

## 2020-06-14 NOTE — H&P (View-Only) (Signed)
Patient ID: Erika Day, female   DOB: 1930-09-29, 86 y.o.   MRN: [id number]  Reason for Consult: New Patient (Initial Visit)   Referred by Mack Hook, MD  Subjective:     HPI:  Erika Day is a 86 y.o. female without previous vascular history.  Has been seen by podiatry for right second toe ulceration.  She states that she does not know how this happened.  She does wear a heel weightbearing shoe on the left has been wearing a sandal on the right foot with the dressing on the right foot.  She does have an aide which helps her with dressing changes she has been wearing an Ace wrap on the right leg as well.  She does not have any pain in her feet.  She is confined to wheelchair but does use both of her legs to transfer and to get around.  She does not take aspirin or statin.  Past Medical History:  Diagnosis Date  . Acid reflux disease   . Cancer ([place])   . CLL (chronic lymphocytic leukemia) ([employer]) 05/15/2016  . DDD (degenerative disc disease), lumbar   . Degenerative joint disease   . Diabetes mellitus without complication [facility])    Patient states this was a misdiagnosis from years ago  . Dyslipidemia   . History of diabetes mellitus 04/19/2017  . Hypertension   . 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 Panic attacks   . Pneumonia 02/2016  . 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 Stroke [facility]) age 55 yo   poorly controlled DM and Hypertension  . Volvulus of sigmoid colon (H[facility])    Family History  Problem Relation Age of Onset  . Hypertension Mother   . Stroke Mother   . Arthritis Mother   . Alcohol abuse Father    Past Surgical History:  Procedure Laterality Date  . ABDOMINAL HYSTERECTOMY  ? 08/25/2011 ?   Has BSO for left benign ovarian tumor with torsion and broad ligament benign tumor, but does not appear had hysterectomy--[facility], NOT ovarian cancer  . CHOLECYSTECTOMY    . FEMUR IM NAIL Left 11/23/2016   Procedure:  INTRAMEDULLARY (IM) NAIL FEMORAL;  Surgeon: Gaynelle Arabian, MD;  Location: [facility];  Service: Orthopedics;  Laterality: Left;  . FLEXIBLE SIGMOIDOSCOPY N/A 05/19/2016   Procedure: FLEXIBLE SIGMOIDOSCOPY;  Surgeon: [name], MD;  [facility];  Service: Endoscopy;  Laterality: N/A;  . FLEXIBLE SIGMOIDOSCOPY N/A 06/28/2016   Procedure: FLEXIBLE SIGMOIDOSCOPY;  Surgeon: Ladene Artist, MD;  [facility];  Service: Endoscopy;  Laterality: N/A;  . LAPAROSCOPIC SIGMOID COLECTOMY N/A 06/30/2016   Procedure: LAPAROSCOPIC SIGMOID COLECTOMY,rigid sigmoidoscopy, repair recurrent incisional hernia;  Surgeon: Michael Boston, MD;  Location: [facility];  Service: General;  Laterality: N/A;  . LAPAROTOMY N/A 04/29/2014   Procedure: EXPLORATORY LAPAROTOMY;  Surgeon: Imogene Burn. Georgette Dover, MD;  Location: [place];  Service: General;  Laterality: N/A;  . LAPAROTOMY N/A 04/16/2018   Procedure: EXPLORATORY LAPAROTOMY WITH LYSIS OF ADHESIONS;  Surgeon: Armandina Gemma, MD;  [facility];  Service: General;  Laterality: N/A;  . LYSIS OF ADHESION N/A 04/29/2014   Procedure: EXTENSIVE LYSIS OF ADHESIONS;  Surgeon: Imogene Burn. Georgette Dover, MD;  Location: [place];  Service: General;  Laterality: N/A;  . VENTRAL HERNIA REPAIR N/A 04/29/2014   Procedure: PRIMARY REPAIR OF VENTRAL HERNIA;  Surgeon: Imogene Burn. Georgette Dover, MD;  Location: [place];  Service: General;  Laterality: N/A;  Short Social History:  Social History   Tobacco Use  . Smoking status: Former Smoker    Types: Cigarettes    Quit date: 02/18/1990    Years since quitting: 30.3  . Smokeless tobacco: Former Systems developer    Types: Snuff    Quit date: 02/18/1990  Substance Use Topics  . Alcohol use: No    Alcohol/week: 0.0 standard drinks    Comment: Only drank alcohol between ages of 42 and 73 yo.  Her father gave it to her    Allergies  Allergen Reactions  . Morphine And Related Other (See Comments)    Blood sugar dropped one-time    Current Outpatient Medications   Medication Sig Dispense Refill  . bisacodyl (DULCOLAX) 5 MG EC tablet Take 5 mg by mouth daily as needed for moderate constipation.    . cetirizine (ZYRTEC) 10 MG tablet 1 tab by mouth once daily with evening meal for allergies 30 tablet 11  . dorzolamide-timolol (COSOPT) 22.3-6.8 MG/ML ophthalmic solution 1 drop 2 (two) times daily.    . furosemide (LASIX) 40 MG tablet Take 1 tablet (40 mg total) by mouth daily. 30 tablet 11  . latanoprost (XALATAN) 0.005 % ophthalmic solution 1 drop at bedtime.    Marland Kitchen lisinopril (ZESTRIL) 10 MG tablet Take 1 tablet (10 mg total) by mouth daily. 90 tablet 3  . pantoprazole (PROTONIX) 40 MG tablet 1 tab by mouth daily at 10 pm on empty stomach 30 tablet 11  . polyethylene glycol (MIRALAX / GLYCOLAX) 17 g packet Take 17 g by mouth daily as needed. 14 each 0  . potassium chloride (KLOR-CON) 10 MEQ tablet TAKE 1 TABLET(10 MEQ) BY MOUTH DAILY 30 tablet 11  . senna-docusate (SENOKOT-S) 8.6-50 MG tablet Take 1 tablet by mouth at bedtime as needed for mild constipation. 30 tablet 0   No current facility-administered medications for this visit.    Review of Systems  Constitutional:  Constitutional negative. HENT: HENT negative.  Eyes: Eyes negative.  Respiratory: Respiratory negative.  Cardiovascular: Cardiovascular negative.  GI: Gastrointestinal negative.  Musculoskeletal: Positive for gait problem.  Skin: Positive for wound.  Hematologic: Hematologic/lymphatic negative.  Psychiatric: Psychiatric negative.        Objective:  Objective  Vitals:   06/14/20 1205  BP: 103/65  Pulse: 77  Resp: 20  Temp: 98.3 F (36.8 C)  SpO2: 96%    Physical Exam Constitutional:      Comments: In wheelchair  HENT:     Head: Normocephalic.  Cardiovascular:     Rate and Rhythm: Normal rate.     Pulses:          Radial pulses are 2+ on the right side and 2+ on the left side.       Femoral pulses are 2+ on the right side and 2+ on the left side.      Popliteal  pulses are 0 on the right side and 0 on the left side.       Dorsalis pedis pulses are detected w/ Doppler on the right side and detected w/ Doppler on the left side.       Posterior tibial pulses are detected w/ Doppler on the right side and detected w/ Doppler on the left side.  Pulmonary:     Effort: Pulmonary effort is normal.  Abdominal:     General: Abdomen is flat.     Palpations: Abdomen is soft. There is no mass.  Musculoskeletal:        General:  Normal range of motion.     Cervical back: Normal range of motion.     Right lower leg: No edema.     Left lower leg: Edema present.  Skin:    Capillary Refill: Capillary refill takes 2 to 3 seconds.     Comments: The right second toe was discolored with small ulcerations  Neurological:     General: No focal deficit present.     Mental Status: She is alert.  Psychiatric:        Mood and Affect: Mood normal.        Thought Content: Thought content normal.        Judgment: Judgment normal.     Data: ABI Findings:  +---------+------------------+-----+----------+--------+  Right  Rt Pressure (mmHg)IndexWaveform Comment   +---------+------------------+-----+----------+--------+  Brachial 142                      +---------+------------------+-----+----------+--------+  CFA               triphasic       +---------+------------------+-----+----------+--------+  Popliteal            biphasic       +---------+------------------+-----+----------+--------+  ATA   255        1.66 monophasic      +---------+------------------+-----+----------+--------+  PTA   255        1.66 monophasic      +---------+------------------+-----+----------+--------+  PERO   255        1.66 monophasic      +---------+------------------+-----+----------+--------+  Great Toe60        0.39 Abnormal        +---------+------------------+-----+----------+--------+   +---------+------------------+-----+----------+-------+  Left   Lt Pressure (mmHg)IndexWaveform Comment  +---------+------------------+-----+----------+-------+  Brachial 154                      +---------+------------------+-----+----------+-------+  CFA               triphasic       +---------+------------------+-----+----------+-------+  ATA   255        1.66 monophasic      +---------+------------------+-----+----------+-------+  PTA   255        1.66 monophasic      +---------+------------------+-----+----------+-------+  PERO   255        1.66 monophasic      +---------+------------------+-----+----------+-------+  Great Toe0         0.00 Abnormal       +---------+------------------+-----+----------+-------+   +-------+-----------+-----------+------------+------------+  ABI/TBIToday's ABIToday's TBIPrevious ABIPrevious TBI  +-------+-----------+-----------+------------+------------+  Right [place]     .39                  +-------+-----------+-----------+------------+------------+  Left  [place]     0                   +-------+-----------+-----------+------------+------------+    Unable to obtain left popliteal waveform due to patients discomfort and  inability to hold position.    Summary:  Right: Resting right ankle-brachial index indicates noncompressible right  lower extremity arteries. The right toe-brachial index is abnormal.   Left: Resting left ankle-brachial index indicates noncompressible left  lower extremity arteries. The left toe-brachial index is abnormal.      Assessment/Plan:     86 year old female with right second toe early gangrenous changes.  She has monophasic signals throughout her bilateral lower extremities  decreased toe pressure.  There are no palpable pulses are strong signals bilateral R posterior tibial.  We will begin with angiography from the left common femoral approach to possibly treat the right lower extremity.  Will possibly need CO2 depending on her creatinine at the time of procedure.     Waynetta Sandy MD [facility]

## 2020-06-15 ENCOUNTER — Other Ambulatory Visit (HOSPITAL_COMMUNITY)
Admission: RE | Admit: 2020-06-15 | Discharge: 2020-06-15 | Disposition: A | Discharge status: Home / Self Care | Payer: Medicare Other | Source: Ambulatory Visit | Attending: Vascular Surgery | Admitting: Vascular Surgery

## 2020-06-15 DIAGNOSIS — Z01812 Encounter for preprocedural laboratory examination: Secondary | ICD-10-CM | POA: Insufficient documentation

## 2020-06-15 DIAGNOSIS — Z20822 Contact with and (suspected) exposure to covid-19: Secondary | ICD-10-CM | POA: Insufficient documentation

## 2020-06-15 LAB — SARS CORONAVIRUS 2 (TAT 6-24 HRS): SARS Coronavirus 2: NEGATIVE

## 2020-06-17 ENCOUNTER — Other Ambulatory Visit: Payer: Self-pay

## 2020-06-17 ENCOUNTER — Encounter (HOSPITAL_COMMUNITY)
Admission: AD | Disposition: A | Discharge status: Home / Self Care | Payer: Self-pay | Source: Home / Self Care | Attending: Vascular Surgery

## 2020-06-17 ENCOUNTER — Inpatient Hospital Stay (HOSPITAL_COMMUNITY)
Admission: AD | Admit: 2020-06-17 | Discharge: 2020-06-19 | DRG: 982 | Disposition: A | Discharge status: Home / Self Care | Payer: Medicare Other | Attending: Vascular Surgery | Admitting: Vascular Surgery

## 2020-06-17 DIAGNOSIS — Z87891 Personal history of nicotine dependence: Secondary | ICD-10-CM

## 2020-06-17 DIAGNOSIS — Z8639 Personal history of other endocrine, nutritional and metabolic disease: Secondary | ICD-10-CM

## 2020-06-17 DIAGNOSIS — I5032 Chronic diastolic (congestive) heart failure: Secondary | ICD-10-CM | POA: Diagnosis present

## 2020-06-17 DIAGNOSIS — Z823 Family history of stroke: Secondary | ICD-10-CM

## 2020-06-17 DIAGNOSIS — D62 Acute posthemorrhagic anemia: Principal | ICD-10-CM | POA: Diagnosis present

## 2020-06-17 DIAGNOSIS — N183 Chronic kidney disease, stage 3 unspecified: Secondary | ICD-10-CM | POA: Diagnosis present

## 2020-06-17 DIAGNOSIS — Z8673 Personal history of transient ischemic attack (TIA), and cerebral infarction without residual deficits: Secondary | ICD-10-CM

## 2020-06-17 DIAGNOSIS — I739 Peripheral vascular disease, unspecified: Secondary | ICD-10-CM | POA: Diagnosis present

## 2020-06-17 DIAGNOSIS — Z993 Dependence on wheelchair: Secondary | ICD-10-CM

## 2020-06-17 DIAGNOSIS — I248 Other forms of acute ischemic heart disease: Secondary | ICD-10-CM | POA: Diagnosis present

## 2020-06-17 DIAGNOSIS — I70261 Atherosclerosis of native arteries of extremities with gangrene, right leg: Secondary | ICD-10-CM | POA: Diagnosis present

## 2020-06-17 DIAGNOSIS — R1013 Epigastric pain: Secondary | ICD-10-CM | POA: Diagnosis present

## 2020-06-17 DIAGNOSIS — Z20822 Contact with and (suspected) exposure to covid-19: Secondary | ICD-10-CM | POA: Diagnosis present

## 2020-06-17 DIAGNOSIS — Z8249 Family history of ischemic heart disease and other diseases of the circulatory system: Secondary | ICD-10-CM

## 2020-06-17 DIAGNOSIS — I998 Other disorder of circulatory system: Secondary | ICD-10-CM | POA: Diagnosis not present

## 2020-06-17 DIAGNOSIS — I130 Hypertensive heart and chronic kidney disease with heart failure and stage 1 through stage 4 chronic kidney disease, or unspecified chronic kidney disease: Secondary | ICD-10-CM | POA: Diagnosis present

## 2020-06-17 DIAGNOSIS — I700 Atherosclerosis of aorta: Secondary | ICD-10-CM | POA: Diagnosis present

## 2020-06-17 DIAGNOSIS — I350 Nonrheumatic aortic (valve) stenosis: Secondary | ICD-10-CM | POA: Diagnosis present

## 2020-06-17 DIAGNOSIS — Z885 Allergy status to narcotic agent status: Secondary | ICD-10-CM

## 2020-06-17 DIAGNOSIS — K219 Gastro-esophageal reflux disease without esophagitis: Secondary | ICD-10-CM | POA: Diagnosis present

## 2020-06-17 DIAGNOSIS — L97519 Non-pressure chronic ulcer of other part of right foot with unspecified severity: Secondary | ICD-10-CM | POA: Diagnosis present

## 2020-06-17 DIAGNOSIS — D472 Monoclonal gammopathy: Secondary | ICD-10-CM | POA: Diagnosis present

## 2020-06-17 DIAGNOSIS — I708 Atherosclerosis of other arteries: Secondary | ICD-10-CM | POA: Diagnosis present

## 2020-06-17 DIAGNOSIS — Z79899 Other long term (current) drug therapy: Secondary | ICD-10-CM

## 2020-06-17 HISTORY — PX: PERIPHERAL VASCULAR INTERVENTION: CATH118257

## 2020-06-17 HISTORY — PX: ABDOMINAL AORTOGRAM W/LOWER EXTREMITY: CATH118223

## 2020-06-17 LAB — POCT I-STAT, CHEM 8
BUN: 55 mg/dL — ABNORMAL HIGH (ref 8–23)
Calcium, Ion: 1.2 mmol/L (ref 1.15–1.40)
Chloride: 100 mmol/L (ref 98–111)
Creatinine, Ser: 2.1 mg/dL — ABNORMAL HIGH (ref 0.44–1.00)
Glucose, Bld: 81 mg/dL (ref 70–99)
HCT: 31 % — ABNORMAL LOW (ref 36.0–46.0)
Hemoglobin: 10.5 g/dL — ABNORMAL LOW (ref 12.0–15.0)
Potassium: 6.5 mmol/L (ref 3.5–5.1)
Sodium: 136 mmol/L (ref 135–145)
TCO2: 29 mmol/L (ref 22–32)

## 2020-06-17 LAB — BASIC METABOLIC PANEL
Anion gap: 9 (ref 5–15)
Anion gap: 12 (ref 5–15)
BUN: 37 mg/dL — ABNORMAL HIGH (ref 8–23)
BUN: 38 mg/dL — ABNORMAL HIGH (ref 8–23)
CO2: 28 mmol/L (ref 22–32)
CO2: 22 mmol/L (ref 22–32)
Calcium: 10.2 mg/dL (ref 8.9–10.3)
Calcium: 9.2 mg/dL (ref 8.9–10.3)
Chloride: 98 mmol/L (ref 98–111)
Chloride: 101 mmol/L (ref 98–111)
Creatinine, Ser: 2.03 mg/dL — ABNORMAL HIGH (ref 0.44–1.00)
Creatinine, Ser: 1.96 mg/dL — ABNORMAL HIGH (ref 0.44–1.00)
GFR calc Af Amer: 25 mL/min — ABNORMAL LOW (ref >60)
GFR calc Af Amer: 26 mL/min — ABNORMAL LOW (ref >60)
GFR calc non Af Amer: 21 mL/min — ABNORMAL LOW (ref >60)
GFR calc non Af Amer: 22 mL/min — ABNORMAL LOW (ref >60)
Glucose, Bld: 78 mg/dL (ref 70–99)
Glucose, Bld: 112 mg/dL — ABNORMAL HIGH (ref 70–99)
Potassium: 5.2 mmol/L — ABNORMAL HIGH (ref 3.5–5.1)
Potassium: 4.6 mmol/L (ref 3.5–5.1)
Sodium: 135 mmol/L (ref 135–145)
Sodium: 135 mmol/L (ref 135–145)

## 2020-06-17 LAB — CBC
HCT: 28 % — ABNORMAL LOW (ref 36.0–46.0)
Hemoglobin: 9 g/dL — ABNORMAL LOW (ref 12.0–15.0)
MCH: 28.5 pg (ref 26.0–34.0)
MCHC: 32.1 g/dL (ref 30.0–36.0)
MCV: 88.6 fL (ref 80.0–100.0)
Platelets: 149 10*3/uL — ABNORMAL LOW (ref 150–400)
RBC: 3.16 MIL/uL — ABNORMAL LOW (ref 3.87–5.11)
RDW: 12.1 % (ref 11.5–15.5)
WBC: 6.5 10*3/uL (ref 4.0–10.5)
nRBC: 0 % (ref 0.0–0.2)

## 2020-06-17 LAB — GLUCOSE, CAPILLARY: Glucose-Capillary: 100 mg/dL — ABNORMAL HIGH (ref 70–99)

## 2020-06-17 LAB — TROPONIN I (HIGH SENSITIVITY): Troponin I (High Sensitivity): 34 ng/L — ABNORMAL HIGH (ref <18)

## 2020-06-17 SURGERY — ABDOMINAL AORTOGRAM W/LOWER EXTREMITY
Anesthesia: LOCAL

## 2020-06-17 MED ORDER — SODIUM CHLORIDE 0.9 % IV SOLN: INTRAVENOUS | Status: DC

## 2020-06-17 MED ORDER — ONDANSETRON HCL 4 MG/2ML IJ SOLN
INTRAMUSCULAR | Status: AC
  Filled 2020-06-17: qty 2

## 2020-06-17 MED ORDER — HYDRALAZINE HCL 20 MG/ML IJ SOLN: 5.0000 mg | INTRAMUSCULAR | Status: DC | PRN

## 2020-06-17 MED ORDER — ONDANSETRON HCL 4 MG/2ML IJ SOLN: 4.0000 mg | Freq: Four times a day (QID) | INTRAMUSCULAR | Status: DC | PRN

## 2020-06-17 MED ORDER — SODIUM CHLORIDE 0.9% FLUSH
10.0000 mL | Freq: Two times a day (BID) | INTRAVENOUS | Status: DC
  Administered 2020-06-18: 10 mL

## 2020-06-17 MED ORDER — ONDANSETRON HCL 4 MG/2ML IJ SOLN
INTRAMUSCULAR | Status: DC | PRN
  Administered 2020-06-17: 4 mg via INTRAVENOUS

## 2020-06-17 MED ORDER — HYDRALAZINE HCL 20 MG/ML IJ SOLN
INTRAMUSCULAR | Status: DC | PRN
  Administered 2020-06-17: 10 mg via INTRAVENOUS

## 2020-06-17 MED ORDER — HEPARIN (PORCINE) IN NACL 1000-0.9 UT/500ML-% IV SOLN
INTRAVENOUS | Status: AC
  Filled 2020-06-17: qty 1000

## 2020-06-17 MED ORDER — LIDOCAINE HCL (PF) 1 % IJ SOLN
INTRAMUSCULAR | Status: AC
  Filled 2020-06-17: qty 30

## 2020-06-17 MED ORDER — SODIUM CHLORIDE 0.9% FLUSH: 10.0000 mL | INTRAVENOUS | Status: DC | PRN

## 2020-06-17 MED ORDER — HEPARIN SODIUM (PORCINE) 1000 UNIT/ML IJ SOLN
INTRAMUSCULAR | Status: DC | PRN
  Administered 2020-06-17: 4000 [IU] via INTRAVENOUS
  Administered 2020-06-17: 7000 [IU] via INTRAVENOUS

## 2020-06-17 MED ORDER — GUAIFENESIN-DM 100-10 MG/5ML PO SYRP: 15.0000 mL | ORAL_SOLUTION | ORAL | Status: DC | PRN

## 2020-06-17 MED ORDER — FENTANYL CITRATE (PF) 100 MCG/2ML IJ SOLN
INTRAMUSCULAR | Status: AC
  Filled 2020-06-17: qty 2

## 2020-06-17 MED ORDER — HEPARIN SODIUM (PORCINE) 1000 UNIT/ML IJ SOLN
INTRAMUSCULAR | Status: AC
  Filled 2020-06-17: qty 1

## 2020-06-17 MED ORDER — LIDOCAINE HCL (PF) 1 % IJ SOLN
INTRAMUSCULAR | Status: DC | PRN
  Administered 2020-06-17: 4 mL
  Administered 2020-06-17: 18 mL

## 2020-06-17 MED ORDER — PANTOPRAZOLE SODIUM 40 MG PO TBEC
40.0000 mg | DELAYED_RELEASE_TABLET | Freq: Every day | ORAL | Status: DC
  Administered 2020-06-18 – 2020-06-19 (×3): 40 mg via ORAL
  Filled 2020-06-17 (×3): qty 1

## 2020-06-17 MED ORDER — ROSUVASTATIN CALCIUM 10 MG PO TABS
10.0000 mg | ORAL_TABLET | Freq: Every day | ORAL | 11 refills | Status: DC
Start: 2020-06-17 — End: 2020-07-29

## 2020-06-17 MED ORDER — HYDRALAZINE HCL 20 MG/ML IJ SOLN
INTRAMUSCULAR | Status: AC
  Filled 2020-06-17: qty 1

## 2020-06-17 MED ORDER — POTASSIUM CHLORIDE CRYS ER 20 MEQ PO TBCR
20.0000 meq | EXTENDED_RELEASE_TABLET | Freq: Once | ORAL | Status: DC
  Filled 2020-06-17: qty 2

## 2020-06-17 MED ORDER — PHENOL 1.4 % MT LIQD: 1.0000 | OROMUCOSAL | Status: DC | PRN

## 2020-06-17 MED ORDER — ALUM & MAG HYDROXIDE-SIMETH 200-200-20 MG/5ML PO SUSP: 15.0000 mL | ORAL | Status: DC | PRN

## 2020-06-17 MED ORDER — HEPARIN (PORCINE) IN NACL 1000-0.9 UT/500ML-% IV SOLN
INTRAVENOUS | Status: DC | PRN
  Administered 2020-06-17 (×2): 500 mL

## 2020-06-17 MED ORDER — CLOPIDOGREL BISULFATE 75 MG PO TABS
75.0000 mg | ORAL_TABLET | Freq: Every day | ORAL | 11 refills | Status: DC
Start: 2020-06-17 — End: 2020-07-29

## 2020-06-17 MED ORDER — CLOPIDOGREL BISULFATE 300 MG PO TABS
ORAL_TABLET | ORAL | Status: DC | PRN
  Administered 2020-06-17: 300 mg via ORAL

## 2020-06-17 MED ORDER — LABETALOL HCL 5 MG/ML IV SOLN: 10.0000 mg | INTRAVENOUS | Status: DC | PRN

## 2020-06-17 MED ORDER — FENTANYL CITRATE (PF) 100 MCG/2ML IJ SOLN
INTRAMUSCULAR | Status: DC | PRN
  Administered 2020-06-17: 25 ug via INTRAVENOUS

## 2020-06-17 MED ORDER — CLOPIDOGREL BISULFATE 300 MG PO TABS
ORAL_TABLET | ORAL | Status: AC
  Filled 2020-06-17: qty 1

## 2020-06-17 MED ORDER — METOPROLOL TARTRATE 5 MG/5ML IV SOLN: 2.0000 mg | INTRAVENOUS | Status: DC | PRN

## 2020-06-17 SURGICAL SUPPLY — 33 items
BALLN JADE .014 3.0 X 60 (BALLOONS) ×3
BALLOON JADE .014 3.0 X 60 (BALLOONS) IMPLANT
CATH AURYON 4FR ATHEREC 0.9 (CATHETERS) ×1 IMPLANT
CATH CXI 2.6F 90 ANG (CATHETERS) ×3
CATH CXI SUPP 2.6F 150 ANG (CATHETERS) ×1 IMPLANT
CATH OMNI FLUSH 5F 65CM (CATHETERS) ×1 IMPLANT
CATH QUICKCROSS ANG SELECT (CATHETERS) ×1 IMPLANT
CATH SPRT ANG 90X2.3FR ACPT (CATHETERS) IMPLANT
CATH STRAIGHT 5FR 65CM (CATHETERS) ×1 IMPLANT
CLOSURE MYNX CONTROL 6F/7F (Vascular Products) ×1 IMPLANT
DEVICE ONE SNARE 10MM (MISCELLANEOUS) ×1 IMPLANT
FILTER CO2 0.2 MICRON (VASCULAR PRODUCTS) ×1 IMPLANT
GUIDEWIRE ZILIENT 12G 018 (WIRE) ×1 IMPLANT
KIT ENCORE 26 ADVANTAGE (KITS) ×1 IMPLANT
KIT PV (KITS) ×3 IMPLANT
PATCH THROMBIX TOPICAL PLAIN (HEMOSTASIS) ×1 IMPLANT
RESERVOIR CO2 (VASCULAR PRODUCTS) ×1 IMPLANT
SET FLUSH CO2 (MISCELLANEOUS) ×1 IMPLANT
SET INTRODUCER MICROPUNCT 5F (INTRODUCER) ×1 IMPLANT
SHEATH FLEX ANSEL ANG 6F 45CM (SHEATH) ×1 IMPLANT
SHEATH MICROPUNCTURE PEDAL 4FR (SHEATH) ×1 IMPLANT
SHEATH PINNACLE 5F 10CM (SHEATH) ×1 IMPLANT
SHEATH PINNACLE 6F 10CM (SHEATH) ×1 IMPLANT
SHEATH PROBE COVER 6X72 (BAG) ×1 IMPLANT
STENT SYNERGY XD 3.0X48 (Permanent Stent) IMPLANT
SYNERGY XD 3.0X48 (Permanent Stent) ×3 IMPLANT
SYR MEDRAD MARK V 150ML (SYRINGE) IMPLANT
TRANSDUCER W/STOPCOCK (MISCELLANEOUS) ×3 IMPLANT
TRAY PV CATH (CUSTOM PROCEDURE TRAY) ×3 IMPLANT
WIRE BENTSON .035X145CM (WIRE) ×1 IMPLANT
WIRE G V18X300CM (WIRE) ×3 IMPLANT
WIRE ROSEN-J .035X260CM (WIRE) ×1 IMPLANT
WIRE SPARTACORE .014X300CM (WIRE) ×1 IMPLANT

## 2020-06-17 NOTE — Progress Notes (Signed)
VAST consulted to obtain stat IV access. To ShortStay 14 to assess pt's vasculature. No appropriate veins noted for USGIV in bilateral lower arms. Pt was stuck multiple times for IV access, and multiple blown veins are noted in lower arms and ac locations. Appropriate vessel for midline placement in right upper arm, but patient has CrCl = 20.44.  Carlis Abbott, MD to bedside. He ok'd midline placement as pt is postop and needs reliable access tonight for hypotension. Order written and information passed to night shift.

## 2020-06-17 NOTE — Progress Notes (Signed)
Patient came back from the cath lab with blood pressures in the 60's. Dr paged and told to give a 250 ml bolus. Patient blood pressures still remained very low. Rapid response called and doctor was paged again. Patient is now being admitted. Will report off to nurse on 4E.

## 2020-06-17 NOTE — Op Note (Signed)
Patient name: Erika Day MRN: [id number] DOB: 1930/01/10 Sex: female  06/17/2020 Pre-operative Diagnosis: critical right lower extremity ischemia with second toe ulceration Post-operative diagnosis:  Same Surgeon:  Erlene Quan C. Donzetta Matters, MD Procedure Performed: 1.  Ultrasound-guided cannulation left common femoral artery 2.  CO2 aortogram and limited bilateral lower extremity angiography 3.  CO2 and contrasted right lower extremity angiography 4.  Fluoroscopic guided cannulation right peroneal artery 5.  Laser atherectomy with [place] 0.9 of right tp trunk and peroneal artery 6.  Drug-eluting stent placement right tp trunk and peroneal artery with 3 x 40m Synergy  7.  Mynx device closure left common femoral artery   Indications: 86year old female with second toe ulceration.  She has decreased ABIs and toe pressures she is indicated for angiography with possible invention.  Findings: Aorta and iliac segments were heavily calcified and tortuous but did not appear to have any flow-limiting stenosis.  Left lower extremity we evaluated the SFA this appeared patent with CO2.  The right lower extremity initially appeared to have some disease although this was calcified did not have any flow-limiting stenosis distally.  Further distally below the knee she had an occluded TP trunk reconstituted peroneal artery.  There is no identifiable posterior tibial artery.  The anterior tibial artery was initially patent and then occluded did not reconstitute.  The peroneal artery did reconstituted posterior tibial at the level of the ankle and fill the foot.  Disease of the TP trunk into the peroneal artery measured approximately 40 mm.  After laser atherectomy balloon angioplasty we then performed drug-eluting stent placement and a completion there was no residual dissection or stenosis or previously was occluded.   Procedure:  The patient was identified in the holding area and taken to room 8.  The patient was  then placed supine on the table and prepped and draped in the usual sterile fashion.  A time out was called.  Ultrasound was used to evaluate the left common femoral artery.  This was diseased although anterior wall was free of disease.  The area was anesthetized 1% lidocaine cannulated with direct ultrasound visualization a micropuncture needle followed by wire and sheath.  And images saved the permanent record.  We placed a Bentson wire followed by 5 FPakistansheath.  We placed an Omni catheter to the level of L1 performed aortogram followed by bilateral lower extremity runoff.  This was done with CO2 we only perform limited left lower extremity runoff.  We then crossed the bifurcation perform right lower extremity angiography with CO2.  We then went to the level of the knee performed angiography with CO2 then contrast and demonstrated the occluded TP trunk into the peroneal artery.  We then placed a Rosen wire followed by a long 6 FPakistansheath patient was fully heparinized.  We attempted to use a series of V 18 as well as ziliant 12g wire with cxi catheter and quick cross elect catheters.  Unfortunately we were unable to cross.  We then placed our straight catheter to just above the knee performed angiography and prepped out the right lower extremity at the level of the ankle and foot.  I used a micropuncture needle was able to first cannulate the peroneal vein fluoroscopically ultimately after several tries cannulate the peroneal artery.  I then placed a micropuncture sheath.  I used a V 18 and CXI catheter was able to cross into the popliteal artery and confirmed intraluminal access.  I then snared a [place] core wire  through and through.  We then performed laser atherectomy of the affected area of the TP trunk and peroneal artery.  We then primarily ballooned where there was residual dissection and then primarily stented with drug-eluting stent 3 x 48 mm.  Completion demonstrated no residual stenosis.  I then  brought the original 3 mm balloon back into the stent and inflated at 2 atm.  The peroneal artery sheath was removed and the wire was retracted into the balloon.  We held pressure overlying the peroneal access site.  We then after 3 minutes released the balloon down remove the balloon wire.  We exchanged over a Rosen wire for short 6 French sheath deployed a minx device.  This deployed well.  She tolerated procedure without immediate complication.  Contrast: 70cc  Stillman Buenger C. Donzetta Matters, MD [facility] [place] Office: [phone] Pager: [phone]

## 2020-06-17 NOTE — Significant Event (Addendum)
Rapid Response Event Note   Reason for Call :  Hypotension, 61/46   Initial Focused Assessment:  Pt lying in bed. Skin is cool, dry to touch. Pt is able to follow commands and oriented x3. Lung sound are clear. Abdomen and left groin are soft. No signs of bleeding noted to left groin puncture site. Pt endorses pain under her left rib, per staff she complained of similar pain prior to surgery.   VS: BP 61/46, HR 77, RR 16, SpO2 99% room air CBG: 100  Interventions:  -500 cc bolus -EKG -Establish IV access  Plan of Care:  -Admit pt to telemetry  Event Summary:  MD Notified: Dr. Carlis Abbott Call Time: 7564 Arrival Time: 1815 End Time: 1920, handoff given to night coverage RRRN. Pt pending inpatient admission.   Casimer Bilis, RN

## 2020-06-17 NOTE — Interval H&P Note (Signed)
History and Physical Interval Note:  06/17/2020 1:16 PM  Erika Day  has presented today for surgery, with the diagnosis of pad.  The various methods of treatment have been discussed with the patient and family. After consideration of risks, benefits and other options for treatment, the patient has consented to  Procedure(s): ABDOMINAL AORTOGRAM W/LOWER EXTREMITY (N/A) as a surgical intervention.  The patient's history has been reviewed, patient examined, no change in status, stable for surgery.  I have reviewed the patient's chart and labs.  Questions were answered to the patient's satisfaction.     Servando Snare

## 2020-06-17 NOTE — Progress Notes (Signed)
Called to recovery for hypotension in Erika Day and rapid response called.  86 year old female who underwent left common femoral access for right peroneal and TP trunk intervention the setting of CLI with tissue loss today with Dr. Donzetta Matters.  Apparently became hypotensive during the case.  Pressures initially were 60/40.  She did get 500 cc bolus in recovery.  I reviewed her EKG which shows Q waves that are evident on an old EKG from May 2021 with no new changes on my review.  We are going to get CBC, BMP, and troponins to rule out cardiac event.  Her only significant complaint is some discomfort under her left rib cage apparently she had this before surgery today.  Her pressure has improved to 90/50's with a MAP of 60 while attempting to get IV access.  I have given the okay for a midline by IV access team given multiple failed attempts to get peripheral IVs.  We will plan to admit her overnight for continued observation.  Dr. Donzetta Matters has updated the family.  At this time I do not think there is any evidence of her bleeding and left groin looks good with no hematoma and palpable femoral pulse.  Will see Hgb on CBC.  Does not appear in overt distress.  Marty Heck, MD [facility] [place] Office: [place]

## 2020-06-18 ENCOUNTER — Encounter (HOSPITAL_COMMUNITY): Payer: Self-pay | Admitting: Vascular Surgery

## 2020-06-18 DIAGNOSIS — R778 Other specified abnormalities of plasma proteins: Secondary | ICD-10-CM

## 2020-06-18 DIAGNOSIS — I959 Hypotension, unspecified: Secondary | ICD-10-CM

## 2020-06-18 DIAGNOSIS — Z8249 Family history of ischemic heart disease and other diseases of the circulatory system: Secondary | ICD-10-CM | POA: Diagnosis not present

## 2020-06-18 DIAGNOSIS — D62 Acute posthemorrhagic anemia: Secondary | ICD-10-CM | POA: Diagnosis present

## 2020-06-18 DIAGNOSIS — R1013 Epigastric pain: Secondary | ICD-10-CM | POA: Diagnosis present

## 2020-06-18 DIAGNOSIS — I350 Nonrheumatic aortic (valve) stenosis: Secondary | ICD-10-CM | POA: Diagnosis present

## 2020-06-18 DIAGNOSIS — Z20822 Contact with and (suspected) exposure to covid-19: Secondary | ICD-10-CM | POA: Diagnosis present

## 2020-06-18 DIAGNOSIS — I5032 Chronic diastolic (congestive) heart failure: Secondary | ICD-10-CM | POA: Diagnosis present

## 2020-06-18 DIAGNOSIS — K219 Gastro-esophageal reflux disease without esophagitis: Secondary | ICD-10-CM | POA: Diagnosis present

## 2020-06-18 DIAGNOSIS — I70235 Atherosclerosis of native arteries of right leg with ulceration of other part of foot: Secondary | ICD-10-CM | POA: Diagnosis present

## 2020-06-18 DIAGNOSIS — Z885 Allergy status to narcotic agent status: Secondary | ICD-10-CM | POA: Diagnosis not present

## 2020-06-18 DIAGNOSIS — I708 Atherosclerosis of other arteries: Secondary | ICD-10-CM | POA: Diagnosis present

## 2020-06-18 DIAGNOSIS — Z8673 Personal history of transient ischemic attack (TIA), and cerebral infarction without residual deficits: Secondary | ICD-10-CM | POA: Diagnosis not present

## 2020-06-18 DIAGNOSIS — I340 Nonrheumatic mitral (valve) insufficiency: Secondary | ICD-10-CM | POA: Diagnosis not present

## 2020-06-18 DIAGNOSIS — Z993 Dependence on wheelchair: Secondary | ICD-10-CM | POA: Diagnosis not present

## 2020-06-18 DIAGNOSIS — Z79899 Other long term (current) drug therapy: Secondary | ICD-10-CM | POA: Diagnosis not present

## 2020-06-18 DIAGNOSIS — Z823 Family history of stroke: Secondary | ICD-10-CM | POA: Diagnosis not present

## 2020-06-18 DIAGNOSIS — L97519 Non-pressure chronic ulcer of other part of right foot with unspecified severity: Secondary | ICD-10-CM | POA: Diagnosis present

## 2020-06-18 DIAGNOSIS — N183 Chronic kidney disease, stage 3 unspecified: Secondary | ICD-10-CM | POA: Diagnosis present

## 2020-06-18 DIAGNOSIS — Z8639 Personal history of other endocrine, nutritional and metabolic disease: Secondary | ICD-10-CM | POA: Diagnosis not present

## 2020-06-18 DIAGNOSIS — I700 Atherosclerosis of aorta: Secondary | ICD-10-CM | POA: Diagnosis present

## 2020-06-18 DIAGNOSIS — D472 Monoclonal gammopathy: Secondary | ICD-10-CM | POA: Diagnosis present

## 2020-06-18 DIAGNOSIS — I130 Hypertensive heart and chronic kidney disease with heart failure and stage 1 through stage 4 chronic kidney disease, or unspecified chronic kidney disease: Secondary | ICD-10-CM | POA: Diagnosis present

## 2020-06-18 DIAGNOSIS — Z87891 Personal history of nicotine dependence: Secondary | ICD-10-CM | POA: Diagnosis not present

## 2020-06-18 DIAGNOSIS — I351 Nonrheumatic aortic (valve) insufficiency: Secondary | ICD-10-CM | POA: Diagnosis not present

## 2020-06-18 DIAGNOSIS — I248 Other forms of acute ischemic heart disease: Secondary | ICD-10-CM | POA: Diagnosis present

## 2020-06-18 DIAGNOSIS — I361 Nonrheumatic tricuspid (valve) insufficiency: Secondary | ICD-10-CM | POA: Diagnosis not present

## 2020-06-18 DIAGNOSIS — I70261 Atherosclerosis of native arteries of extremities with gangrene, right leg: Secondary | ICD-10-CM | POA: Diagnosis present

## 2020-06-18 LAB — CBC
HCT: 22.9 % — ABNORMAL LOW (ref 36.0–46.0)
Hemoglobin: 7.3 g/dL — ABNORMAL LOW (ref 12.0–15.0)
MCH: 28.5 pg (ref 26.0–34.0)
MCHC: 31.9 g/dL (ref 30.0–36.0)
MCV: 89.5 fL (ref 80.0–100.0)
Platelets: 180 10*3/uL (ref 150–400)
RBC: 2.56 MIL/uL — ABNORMAL LOW (ref 3.87–5.11)
RDW: 12.4 % (ref 11.5–15.5)
WBC: 4 10*3/uL (ref 4.0–10.5)
nRBC: 0 % (ref 0.0–0.2)

## 2020-06-18 LAB — CBC WITH DIFFERENTIAL/PLATELET
Abs Immature Granulocytes: 0.01 10*3/uL (ref 0.00–0.07)
Basophils Absolute: 0 10*3/uL (ref 0.0–0.1)
Basophils Relative: 0 %
Eosinophils Absolute: 0 10*3/uL (ref 0.0–0.5)
Eosinophils Relative: 0 %
HCT: 25.6 % — ABNORMAL LOW (ref 36.0–46.0)
Hemoglobin: 8.1 g/dL — ABNORMAL LOW (ref 12.0–15.0)
Immature Granulocytes: 0 %
Lymphocytes Relative: 19 %
Lymphs Abs: 0.8 10*3/uL (ref 0.7–4.0)
MCH: 28.4 pg (ref 26.0–34.0)
MCHC: 31.6 g/dL (ref 30.0–36.0)
MCV: 89.8 fL (ref 80.0–100.0)
Monocytes Absolute: 0.4 10*3/uL (ref 0.1–1.0)
Monocytes Relative: 9 %
Neutro Abs: 3.1 10*3/uL (ref 1.7–7.7)
Neutrophils Relative %: 72 %
Platelets: 143 10*3/uL — ABNORMAL LOW (ref 150–400)
RBC: 2.85 MIL/uL — ABNORMAL LOW (ref 3.87–5.11)
RDW: 12.3 % (ref 11.5–15.5)
WBC: 4.4 10*3/uL (ref 4.0–10.5)
nRBC: 0 % (ref 0.0–0.2)

## 2020-06-18 LAB — BASIC METABOLIC PANEL
Anion gap: 11 (ref 5–15)
Anion gap: 11 (ref 5–15)
BUN: 39 mg/dL — ABNORMAL HIGH (ref 8–23)
BUN: 41 mg/dL — ABNORMAL HIGH (ref 8–23)
CO2: 21 mmol/L — ABNORMAL LOW (ref 22–32)
CO2: 21 mmol/L — ABNORMAL LOW (ref 22–32)
Calcium: 8.6 mg/dL — ABNORMAL LOW (ref 8.9–10.3)
Calcium: 9 mg/dL (ref 8.9–10.3)
Chloride: 106 mmol/L (ref 98–111)
Chloride: 107 mmol/L (ref 98–111)
Creatinine, Ser: 1.93 mg/dL — ABNORMAL HIGH (ref 0.44–1.00)
Creatinine, Ser: 1.95 mg/dL — ABNORMAL HIGH (ref 0.44–1.00)
GFR calc Af Amer: 26 mL/min — ABNORMAL LOW (ref >60)
GFR calc Af Amer: 26 mL/min — ABNORMAL LOW (ref >60)
GFR calc non Af Amer: 22 mL/min — ABNORMAL LOW (ref >60)
GFR calc non Af Amer: 23 mL/min — ABNORMAL LOW (ref >60)
Glucose, Bld: 143 mg/dL — ABNORMAL HIGH (ref 70–99)
Glucose, Bld: 163 mg/dL — ABNORMAL HIGH (ref 70–99)
Potassium: 4.4 mmol/L (ref 3.5–5.1)
Potassium: 4.8 mmol/L (ref 3.5–5.1)
Sodium: 138 mmol/L (ref 135–145)
Sodium: 139 mmol/L (ref 135–145)

## 2020-06-18 LAB — TROPONIN I (HIGH SENSITIVITY)
Troponin I (High Sensitivity): 328 ng/L (ref <18)
Troponin I (High Sensitivity): 310 ng/L (ref <18)

## 2020-06-18 LAB — PREPARE RBC (CROSSMATCH)

## 2020-06-18 MED ORDER — OXYCODONE HCL 5 MG PO TABS: 5.0000 mg | ORAL_TABLET | ORAL | Status: DC | PRN

## 2020-06-18 MED ORDER — ROSUVASTATIN CALCIUM 5 MG PO TABS
10.0000 mg | ORAL_TABLET | Freq: Every day | ORAL | Status: DC
  Administered 2020-06-18 – 2020-06-19 (×2): 10 mg via ORAL
  Filled 2020-06-18 (×2): qty 2

## 2020-06-18 MED ORDER — ACETAMINOPHEN 325 MG PO TABS: 650.0000 mg | ORAL_TABLET | ORAL | Status: DC | PRN

## 2020-06-18 MED ORDER — SODIUM CHLORIDE 0.9% FLUSH
3.0000 mL | INTRAVENOUS | Status: DC | PRN
  Administered 2020-06-19: 3 mL via INTRAVENOUS

## 2020-06-18 MED ORDER — LABETALOL HCL 5 MG/ML IV SOLN: 10.0000 mg | INTRAVENOUS | Status: DC | PRN

## 2020-06-18 MED ORDER — CLOPIDOGREL BISULFATE 75 MG PO TABS
75.0000 mg | ORAL_TABLET | Freq: Every day | ORAL | Status: DC
  Administered 2020-06-19: 75 mg via ORAL
  Filled 2020-06-18: qty 1

## 2020-06-18 MED ORDER — IODIXANOL 320 MG/ML IV SOLN
INTRAVENOUS | Status: DC | PRN
  Administered 2020-06-17: 70 mL via INTRA_ARTERIAL

## 2020-06-18 MED ORDER — SODIUM CHLORIDE 0.9 % WEIGHT BASED INFUSION: 1.0000 mL/kg/h | INTRAVENOUS | Status: AC

## 2020-06-18 MED ORDER — ONDANSETRON HCL 4 MG/2ML IJ SOLN: 4.0000 mg | Freq: Four times a day (QID) | INTRAMUSCULAR | Status: DC | PRN

## 2020-06-18 MED ORDER — SODIUM CHLORIDE 0.9 % IV SOLN: 250.0000 mL | INTRAVENOUS | Status: DC | PRN

## 2020-06-18 MED ORDER — HYDRALAZINE HCL 20 MG/ML IJ SOLN: 5.0000 mg | INTRAMUSCULAR | Status: DC | PRN

## 2020-06-18 MED ORDER — SODIUM CHLORIDE 0.9% FLUSH: 3.0000 mL | Freq: Two times a day (BID) | INTRAVENOUS | Status: DC

## 2020-06-18 MED ORDER — CLOPIDOGREL BISULFATE 75 MG PO TABS
300.0000 mg | ORAL_TABLET | Freq: Once | ORAL | Status: AC
  Administered 2020-06-18: 300 mg via ORAL
  Filled 2020-06-18: qty 4

## 2020-06-18 MED ORDER — MORPHINE SULFATE (PF) 2 MG/ML IV SOLN: 2.0000 mg | INTRAVENOUS | Status: DC | PRN

## 2020-06-18 NOTE — Progress Notes (Signed)
Pt BP dropped to 72/49 (56) bp retaken 77/52. MD called and new order for 12 led EKG, 500 ml bolus and lads drawn. Pt now @ 1325 BP 103/48 (66) () SPo2 100 HR RR15. Will continue to monitor   Phoebe Sharps, RN

## 2020-06-18 NOTE — NC FL2 (Signed)
[place] LEVEL OF CARE SCREENING TOOL     IDENTIFICATION  Patient Name: Erika Day Birthdate: 1930-06-28 Sex: female Admission Date (Current Location): 06/17/2020  [facility] [place] Number:  Herbalist and Address:  [facility] [place], [place] [address], [place], [place] [address]      Provider Number: [id number]  Attending Physician Name and Address:  Cain, Coleman Name and Phone Number:       Current Level of Care: Hospital Recommended Level of Care: [place] Prior Approval Number:    Date Approved/Denied:   PASRR Number: [id number]  Discharge Plan: SNF    Current Diagnoses: Patient Active Problem List   Diagnosis Date Noted  . PAD (peripheral artery disease) ([place]) 06/17/2020  . Gastroesophageal reflux disease without esophagitis 04/10/2020  . Elevated d-dimer 03/27/2020  . Chronic kidney disease, stage 3b 03/27/2020  . Uncontrolled type 2 diabetes mellitus with hyperglycemia ([place]) 03/27/2020  . Pressure injury of skin 03/27/2020  . Goals of care, counseling/discussion 01/23/2020  . Recurrent falls 09/05/2019  . CHF (congestive heart failure) ([place]) 06/28/2019  . MGUS (monoclonal gammopathy of unknown significance) 02/11/2018  . History of diabetes mellitus 04/19/2017  . Hypercalcemia 02/12/2017  . Chronic leukopenia 02/12/2017  . Postoperative anemia due to acute blood loss 11/26/2016  . Fall 11/23/2016  . Closed fracture of intertrochanteric section of femur ([place]) 11/23/2016  . Pancytopenia ([place]) 11/13/2016  . Sinusitis 10/14/2016  . Bleeding external hemorrhoids 08/20/2016  . Recurrent ventral incisional hernia s/p primary repair 06/30/2016 07/01/2016  . Bilateral leg edema 05/20/2016  . Protein-calorie malnutrition, severe ([place]) 05/20/2016  . Sigmoid volvulus s/p lap sigmoid colectomy 06/30/2016 05/19/2016  . 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 Constipation 05/15/2016  . Other fatigue 05/15/2016  . Anemia 02/13/2016  . Thrombocytopenia ([place]) 02/13/2016  . Kyphosis 02/01/2016  . Pressure ulcer of coccygeal region 01/31/2016  . Mobility impaired 01/31/2016  . Osteoarthritis 01/31/2016  . Abdominal pain 04/23/2014  . Essential hypertension 09/06/2011  . Ovarian torsion 08/25/2011    Orientation RESPIRATION BLADDER Height & Weight     Self, Time, Situation, Place  Normal Incontinent, External catheter Weight: 150 lb (68 kg) Height:  5' 2.5" (158.8 cm)  BEHAVIORAL SYMPTOMS/MOOD NEUROLOGICAL BOWEL NUTRITION STATUS      Continent Diet (see discharge summary)  AMBULATORY STATUS COMMUNICATION OF NEEDS Skin   Extensive Assist Verbally Surgical wounds, Skin abrasions (closed incision on R foot with foam dressing)                       Personal Care Assistance Level of Assistance  Bathing, Feeding, Dressing Bathing Assistance: Maximum assistance Feeding assistance: Independent Dressing Assistance: Maximum assistance     Functional Limitations Info  Sight, Speech, Hearing Sight Info: Adequate Hearing Info: Adequate Speech Info: Adequate    SPECIAL CARE FACTORS FREQUENCY  OT (By licensed OT), PT (By licensed PT)     PT Frequency: 5x week OT Frequency: 5x week            Contractures Contractures Info: Not present    Additional Factors Info  Code Status, Allergies Code Status Info: Full Code Allergies Info: Morphine And Related           Current Medications (06/18/2020):  This is the current hospital active medication list Current Facility-Administered Medications  Medication Dose Route  Frequency Provider Last Rate Last Admin  . 0.9 %  sodium chloride infusion  250 mL Intravenous PRN Waynetta Sandy, MD      . 0.9 %  sodium chloride infusion   Intravenous Continuous Marty Heck, MD 100 mL/hr at 06/17/20 2249 New Bag at 06/17/20 2249   . acetaminophen (TYLENOL) tablet 650 mg  650 mg Oral Q4H PRN Waynetta Sandy, MD      . alum & mag hydroxide-simeth (MAALOX/MYLANTA) 200-200-20 MG/5ML suspension 15-30 mL  15-30 mL Oral Q2H PRN Waynetta Sandy, MD      . clopidogrel (PLAVIX) tablet 75 mg  75 mg Oral Q breakfast Waynetta Sandy, MD      . guaiFENesin-dextromethorphan [facility] DM) 100-10 MG/5ML syrup 15 mL  15 mL Oral Q4H PRN Waynetta Sandy, MD      . hydrALAZINE (APRESOLINE) injection 5 mg  5 mg Intravenous Q20 Min PRN Waynetta Sandy, MD      . labetalol (NORMODYNE) injection 10 mg  10 mg Intravenous Q10 min PRN Waynetta Sandy, MD      . metoprolol tartrate (LOPRESSOR) injection 2-5 mg  2-5 mg Intravenous Q2H PRN Waynetta Sandy, MD      . morphine 2 MG/ML injection 2 mg  2 mg Intravenous Q1H PRN Waynetta Sandy, MD      . ondansetron [facility]) injection 4 mg  4 mg Intravenous Q6H PRN Waynetta Sandy, MD      . oxyCODONE (Oxy IR/ROXICODONE) immediate release tablet 5-10 mg  5-10 mg Oral Q4H PRN Waynetta Sandy, MD      . pantoprazole (PROTONIX) EC tablet 40 mg  40 mg Oral Daily Waynetta Sandy, MD   40 mg at 06/18/20 1046  . phenol (CHLORASEPTIC) mouth spray 1 spray  1 spray Mouth/Throat PRN Waynetta Sandy, MD      . potassium chloride SA (KLOR-CON) CR tablet 20-40 mEq  20-40 mEq Oral Once Waynetta Sandy, MD      . rosuvastatin (CRESTOR) tablet 10 mg  10 mg Oral Daily Waynetta Sandy, MD   10 mg at 06/18/20 1046  . sodium chloride flush (NS) 0.9 % injection 10-40 mL  10-40 mL Intracatheter Q12H Waynetta Sandy, MD   10 mL at 06/18/20 1014  . sodium chloride flush (NS) 0.9 % injection 10-40 mL  10-40 mL Intracatheter PRN Waynetta Sandy, MD      . sodium chloride flush (NS) 0.9 % injection 3 mL  3 mL Intravenous Q12H Waynetta Sandy, MD      . sodium chloride  flush (NS) 0.9 % injection 3 mL  3 mL Intravenous PRN Waynetta Sandy, MD         Discharge Medications: Please see discharge summary for a list of discharge medications.  Relevant Imaging Results:  Relevant Lab Results:   Additional Information SSN [id number]; Moderna vaccinations completed  Alexander Mt, LCSW

## 2020-06-18 NOTE — Progress Notes (Addendum)
Progress Note    06/18/2020 7:05 AM 1 Day Post-Op  Subjective:  No complaints.   Vitals:   06/17/20 1935 06/17/20 2333  BP: (!) 96/52 98/66  Pulse: 62 66  Resp:  15  Temp:  98.4 F (36.9 C)  SpO2: 100% 100%    Physical Exam: Cardiac:  RRR Lungs:  CTAB Extremities:  Right foot warm, motor sensation intact. +DP and PT Doppler pulses Abdomen: Left groin soft without hematoma  CBC    Component Value Date/Time   WBC 6.5 06/17/2020 2011   RBC 3.16 (L) 06/17/2020 2011   HGB 9.0 (L) 06/17/2020 2011   HGB 11.2 04/10/2020 1134   HGB 11.7 08/16/2017 0930   HCT 28.0 (L) 06/17/2020 2011   HCT 33.8 (L) 04/10/2020 1134   HCT 35.6 08/16/2017 0930   PLT 149 (L) 06/17/2020 2011   PLT 188 04/10/2020 1134   MCV 88.6 06/17/2020 2011   MCV 88 04/10/2020 1134   MCV 87.3 08/16/2017 0930   MCH 28.5 06/17/2020 2011   MCHC 32.1 06/17/2020 2011   RDW 12.1 06/17/2020 2011   RDW 13.9 04/10/2020 1134   RDW 12.5 08/16/2017 0930   LYMPHSABS 1.3 04/10/2020 1134   LYMPHSABS 1.4 08/16/2017 0930   MONOABS 0.2 03/27/2020 0546   MONOABS 0.3 08/16/2017 0930   EOSABS 0.1 04/10/2020 1134   BASOSABS 0.0 04/10/2020 1134   BASOSABS 0.0 08/16/2017 0930    BMET    Component Value Date/Time   NA 135 06/17/2020 2011   NA 140 04/10/2020 1134   NA 141 02/19/2017 1111   K 4.6 06/17/2020 2011   K 4.3 02/19/2017 1111   CL 101 06/17/2020 2011   CO2 22 06/17/2020 2011   CO2 26 02/19/2017 1111   GLUCOSE 112 (H) 06/17/2020 2011   GLUCOSE 72 02/19/2017 1111   BUN 38 (H) 06/17/2020 2011   BUN 31 (H) 04/10/2020 1134   BUN 16.7 02/19/2017 1111   CREATININE 1.96 (H) 06/17/2020 2011   CREATININE 1.14 (H) 12/01/2018 1239   CREATININE 0.9 02/19/2017 1111   CALCIUM 9.2 06/17/2020 2011   CALCIUM 10.2 02/19/2017 1111   GFRNONAA 22 (L) 06/17/2020 2011   GFRNONAA 44 (L) 12/01/2018 1239   GFRAA 26 (L) 06/17/2020 2011   GFRAA 50 (L) 12/01/2018 1239     Intake/Output Summary (Last 24 hours) at 06/18/2020  0705 Last data filed at 06/18/2020 0300 Gross per 24 hour  Intake 490 ml  Output --  Net 490 ml    HOSPITAL MEDICATIONS Scheduled Meds: . clopidogrel  75 mg Oral Q breakfast  . pantoprazole  40 mg Oral Daily  . potassium chloride  20-40 mEq Oral Once  . rosuvastatin  10 mg Oral Daily  . sodium chloride flush  10-40 mL Intracatheter Q12H  . sodium chloride flush  3 mL Intravenous Q12H   Continuous Infusions: . sodium chloride    . sodium chloride 100 mL/hr at 06/17/20 2249  . sodium chloride     PRN Meds:.sodium chloride, acetaminophen, alum & mag hydroxide-simeth, guaiFENesin-dextromethorphan, hydrALAZINE, labetalol, metoprolol tartrate, morphine injection, ondansetron, oxyCODONE, phenol, sodium chloride flush, sodium chloride flush  Assessment: 86 year old female who underwent left common femoral access for right peroneal and TP trunk intervention the setting of CLI with tissue loss today with Dr. Donzetta Matters. Hypotensive without evidence of blood loss.  She remains hypotensive but asymptomatic, Has not been out of bed. Hgb stable  Plan: -May sit up and get in chair today and closely monitor BP  Risa Grill, PA-C [facility] [phone] 06/18/2020  7:05 AM   I have independently interviewed and examined patient and agree with PA assessment and plan above. She has persistent decreased blood pressure this morning although she is alert appears at baseline.  We will repeat BMP, CBC, troponins this morning.  I am also getting an EKG at this time.  I have discussed with the patient's daughter we will also get physical therapy to evaluate as patient may not be able to return home given safety concerns.  Wayne Wicklund C. Donzetta Matters, MD [facility] [place] Office: [phone] Pager: [phone]

## 2020-06-18 NOTE — Progress Notes (Signed)
CRITICAL VALUE ALERT  Critical Value:  Troponin I = 328  Date & Time Notied: 06/18/20 @1419   Provider Notified: Dr. Servando Snare  Orders Received/Actions taken: no new orders  Will continue to monitor

## 2020-06-18 NOTE — Consult Note (Signed)
Cardiology Consultation:  Patient ID: AMARI BURNSWORTH MRN: [id number]; DOB: 1930/03/01  Admit date: 06/17/2020 Date of Consult: 06/18/2020  Primary Care Provider: Mack Hook, MD Primary Cardiologist: No primary care provider on file.   Patient Profile:  TANAYA DUNIGAN is a 86 y.o. female with a hx of CKD stage III, MGUS, diastolic heart failure, hypertension is being seen today for the evaluation of elevated troponin/hypertension at the request of Servando Snare, MD.  History of Present Illness:  Ms. Rendall was admitted on 06/17/2020 with right toe gangrene.  There was concerns for critical limb ischemia and she was taken to the angiography suite and peripheral angiography was performed.  She underwent drug-eluting stent to the right tibioperoneal and peroneal arteries.  Overnight, she did develop hypotension.  Blood work is also demonstrated a troponin value initially 34 which is up trended to 328 and down to 310.  She reports she had no chest pain.  EKG shows normal sinus rhythm with heart rate in the 70s and no acute ST-T changes or evidence of prior infarction.  This is also coincided with downtrending hemoglobin values.  Her hemoglobin on admission was around 10.5 and she has trended down to 7.3.  She is being given fluids with improvement in blood pressure.  Vascular surgery likely will give her blood when discussed with them.  I did review her most recent echocardiogram from 03/27/2020.  She has moderate aortic stenosis.  This appears to be followed and is stable.  She had normal wall motion.  She has never had a cardiac catheterization or ischemia evaluation that I can tell.  She does report she gets complaints of abdominal pain intermittently.  She reports she can have pain in most of her joints.  She reports she is not that active and lives at home with her husband.  She uses a wheelchair for mobility.  She is quite limited from what she tells me.  Heart Pathway Score:       Past Medical  History: Past Medical History:  Diagnosis Date  . Acid reflux disease   . Cancer ([place])   . CLL (chronic lymphocytic leukemia) ([place]) 05/15/2016  . DDD (degenerative disc disease), lumbar   . Degenerative joint disease   . Diabetes mellitus without complication [facility])    Patient states this was a misdiagnosis from years ago  . Dyslipidemia   . History of diabetes mellitus 04/19/2017  . Hypertension   . 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 Panic attacks   . Pneumonia 02/2016  . 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 Stroke [facility]) age 31 yo   poorly controlled DM and Hypertension  . Volvulus of sigmoid colon [facility])     Past Surgical History: Past Surgical History:  Procedure Laterality Date  . ABDOMINAL AORTOGRAM W/LOWER EXTREMITY N/A 06/17/2020   Procedure: ABDOMINAL AORTOGRAM W/LOWER EXTREMITY;  Surgeon: Waynetta Sandy, MD;  Location: [place] [facility];  Service: Cardiovascular;  Laterality: N/A;  . ABDOMINAL HYSTERECTOMY  ? 08/25/2011 ?   Has BSO for left benign ovarian tumor with torsion and broad ligament benign tumor, but does not appear had hysterectomy--[facility], NOT ovarian cancer  . CHOLECYSTECTOMY    . FEMUR IM NAIL Left 11/23/2016   Procedure: INTRAMEDULLARY (IM) NAIL FEMORAL;  Surgeon: Gaynelle Arabian, MD;  [facility]: [facility];  Service: Orthopedics;  Laterality: Left;  . FLEXIBLE SIGMOIDOSCOPY N/A 05/19/2016   Procedure: FLEXIBLE SIGMOIDOSCOPY;  Surgeon: [name], MD;  [facility];  Service: Endoscopy;  Laterality: N/A;  . FLEXIBLE SIGMOIDOSCOPY N/A 06/28/2016   Procedure: FLEXIBLE SIGMOIDOSCOPY;  Surgeon: Ladene Artist, MD;  [facility];  Service: Endoscopy;  Laterality: N/A;  . LAPAROSCOPIC SIGMOID COLECTOMY N/A 06/30/2016   Procedure: LAPAROSCOPIC SIGMOID COLECTOMY,rigid sigmoidoscopy, repair recurrent incisional hernia;  Surgeon: Michael Boston, MD;  [facility];  Service: General;  Laterality: N/A;   . LAPAROTOMY N/A 04/29/2014   Procedure: EXPLORATORY LAPAROTOMY;  Surgeon: Imogene Burn. Georgette [place], MD;  Location: [place];  Service: General;  Laterality: N/A;  . LAPAROTOMY N/A 04/16/2018   Procedure: EXPLORATORY LAPAROTOMY WITH LYSIS OF ADHESIONS;  Surgeon: Armandina Gemma, MD;  [facility];  Service: General;  Laterality: N/A;  . LYSIS OF ADHESION N/A 04/29/2014   Procedure: EXTENSIVE LYSIS OF ADHESIONS;  Surgeon: Imogene Burn. Georgette [place], MD;  Location: [place];  Service: General;  Laterality: N/A;  . PERIPHERAL VASCULAR INTERVENTION  06/17/2020   Procedure: PERIPHERAL VASCULAR INTERVENTION;  Surgeon: Waynetta Sandy, MD;  Location: [place] [facility];  Service: Cardiovascular;;  right peroneal  . VENTRAL HERNIA REPAIR N/A 04/29/2014   Procedure: PRIMARY REPAIR OF VENTRAL HERNIA;  Surgeon: Imogene Burn. Georgette [place], MD;  Location: [place];  Service: General;  Laterality: N/A;     Home Medications:  Prior to Admission medications   Medication Sig Start Date End Date Taking? Authorizing Provider  bisacodyl (DULCOLAX) 5 MG EC tablet Take 5 mg by mouth daily as needed for moderate constipation.   Yes [provider]  cetirizine (ZYRTEC) 10 MG tablet 1 tab by mouth once daily with evening meal for allergies Patient taking differently: Take 10 mg by mouth daily as needed for allergies.  10/06/19  Yes Mack Hook, MD  dorzolamide-timolol (COSOPT) 22.3-6.8 MG/ML ophthalmic solution Place 1 drop into both eyes 2 (two) times daily.  04/12/20  Yes [provider]  furosemide (LASIX) 40 MG tablet Take 1 tablet (40 mg total) by mouth daily. 04/29/20 10/26/20 Yes Mack Hook, MD  latanoprost (XALATAN) 0.005 % ophthalmic solution Place 1 drop into both eyes at bedtime.  04/12/20  Yes [provider]  lisinopril (ZESTRIL) 10 MG tablet Take 1 tablet (10 mg total) by mouth daily. 06/12/20  Yes Mack Hook, MD  pantoprazole (PROTONIX) 40 MG tablet 1 tab by mouth daily at 10 pm on  empty stomach Patient taking differently: Take 40 mg by mouth daily.  10/06/19  Yes Mack Hook, MD  polyethylene glycol (MIRALAX / GLYCOLAX) 17 g packet Take 17 g by mouth daily as needed. Patient taking differently: Take 17 g by mouth daily as needed for mild constipation.  03/31/20  Yes Amin, Ankit Chirag, MD  potassium chloride (KLOR-CON) 10 MEQ tablet TAKE 1 TABLET(10 MEQ) BY MOUTH DAILY Patient taking differently: Take 10 mEq by mouth daily.  03/15/20  Yes Mack Hook, MD  clopidogrel (PLAVIX) 75 MG tablet Take 1 tablet (75 mg total) by mouth daily. 06/17/20 06/17/21  Waynetta Sandy, MD  rosuvastatin (CRESTOR) 10 MG tablet Take 1 tablet (10 mg total) by mouth daily. 06/17/20 06/17/21  Waynetta Sandy, MD  senna-docusate (SENOKOT-S) 8.6-50 MG tablet Take 1 tablet by mouth at bedtime as needed for mild constipation. Patient not taking: Reported on 06/18/2020 03/31/20   Damita Lack, MD    Inpatient Medications: Scheduled Meds: . clopidogrel  75 mg Oral Q breakfast  . pantoprazole  40 mg Oral Daily  . potassium chloride  20-40 mEq Oral Once  .  rosuvastatin  10 mg Oral Daily  . sodium chloride flush  10-40 mL Intracatheter Q12H  . sodium chloride flush  3 mL Intravenous Q12H   Continuous Infusions: . sodium chloride    . sodium chloride 100 mL/hr at 06/17/20 2249   PRN Meds: sodium chloride, acetaminophen, alum & mag hydroxide-simeth, guaiFENesin-dextromethorphan, hydrALAZINE, labetalol, metoprolol tartrate, morphine injection, ondansetron, oxyCODONE, phenol, sodium chloride flush, sodium chloride flush  Allergies:    Allergies  Allergen Reactions  . Morphine And Related Other (See Comments)    Blood sugar dropped one-time    Social History:   Social History   Socioeconomic History  . Marital status: Married    Spouse name: Norell Brisbin  . Number of children: 0  . Years of education: Not on file  . Highest education level: Not on file   Occupational History  . Occupation: Retired housewife  Tobacco Use  . Smoking status: Former Smoker    Types: Cigarettes    Quit date: 02/18/1990    Years since quitting: 30.3  . Smokeless tobacco: Former Systems developer    Types: Snuff    Quit date: 02/18/1990  Vaping Use  . Vaping Use: Never used  Substance and Sexual Activity  . Alcohol use: No    Alcohol/week: 0.0 standard drinks    Comment: Only drank alcohol between ages of 78 and 79 yo.  Her father gave it to her  . Drug use: No  . Sexual activity: Never  Other Topics Concern  . Not on file  Social History Narrative   Lives at home with her husband, [name], her "adopted daugher, [name]", and [name]'s little 84 yo boy.   Has been in a wheelchair because she kept falling--in wheelchair for about 10 years.   Sometimes gets up and holds onto furniture or wall to get around.     Did have a walker, but fell with that.  At one point had PT   Does not have any biologic children, but has cared for other family members' children through childhood   [name] is the granddaughter of her 45rd cousin.  She raised [name]'s mother, [name] as well.     89 yo son of [name] also lives in their home.     He is in daycare and his great grandmother watches him when she is at work.   Has brought up about 7 children over past 50 years.   Social Determinants of Health   Financial Resource Strain:   . Difficulty of Paying Living Expenses:   Food Insecurity:   . Worried About Charity fundraiser in the Last Year:   . Arboriculturist in the Last Year:   Transportation Needs:   . Film/video editor (Medical):   Marland Kitchen Lack of Transportation (Non-Medical):   Physical Activity:   . Days of Exercise per Week:   . Minutes of Exercise per Session:   Stress:   . Feeling of Stress :   Social Connections:   . Frequency of Communication with Friends and Family:   . Frequency of Social Gatherings with Friends and Family:   . Attends Religious Services:   .  Active Member of Clubs or Organizations:   . Attends Archivist Meetings:   Marland Kitchen Marital Status:   Intimate Partner Violence:   . Fear of Current or Ex-Partner:   . Emotionally Abused:   Marland Kitchen Physically Abused:   . Sexually Abused:      Family History:  Family History  Problem Relation Age of Onset  . Hypertension Mother   . Stroke Mother   . Arthritis Mother   . Alcohol abuse Father      ROS:  All other ROS reviewed and negative. Pertinent positives noted in the HPI.     Physical Exam/Data:   Vitals:   06/18/20 1302 06/18/20 1304 06/18/20 1308 06/18/20 1326  BP: (!) 72/49 (!) 77/52 (!) 82/42 (!) 103/48  Pulse:    67  Resp: 19 (!) 22 (!) 22 16  Temp:      TempSrc:      SpO2: 97% 97% 97% 100%  Weight:      Height:         Intake/Output Summary (Last 24 hours) at 06/18/2020 1616 Last data filed at 06/18/2020 1012 Gross per 24 hour  Intake 240 ml  Output 500 ml  Net -260 ml    Last 3 Weights 06/17/2020 06/14/2020 06/12/2020  Weight (lbs) 150 lb 156 lb (No Data)  Weight (kg) 68.04 kg 70.761 kg (No Data)    Body mass index is 27 kg/m.   General: Well nourished, well developed, in no acute distress Head: Atraumatic, normal size  Eyes: PEERLA, EOMI  Neck: Supple, no JVD Endocrine: No thryomegaly Cardiac: Normal S1, S2; 3 out of 6 systolic ejection murmur, early peaking Lungs: Clear to auscultation bilaterally, no wheezing, rhonchi or rales  Abd: Soft, nontender, no hepatomegaly  Ext: Trace edema Musculoskeletal: No deformities, BUE and BLE strength normal and equal Skin: Warm and dry, no rashes   Neuro: Alert and oriented to person, place, time, and situation, CNII-XII grossly intact, no focal deficits  Psych: Normal mood and affect   EKG:  The EKG was personally reviewed and demonstrates: Normal sinus rhythm with first-degree AV block, heart rate 73, old anteroseptal infarct, no change from EKG dated 03/27/2020 Telemetry:  Telemetry was personally reviewed  and demonstrates: Normal sinus rhythm with heart rate in the 70s  Relevant CV Studies: TTE 03/27/2020 1. Left ventricular ejection fraction, by estimation, is 60 to 65%. The  left ventricle has normal function. The left ventricle has no regional  wall motion abnormalities. There is moderate concentric left ventricular  hypertrophy. Left ventricular  diastolic function could not be evaluated.  2. Right ventricular systolic function is normal. The right ventricular  size is normal. There is normal pulmonary artery systolic pressure. The  estimated right ventricular systolic pressure is 24.8 mmHg.  3. Left atrial size was mildly dilated.  4. The mitral valve is degenerative. No evidence of mitral valve  regurgitation. No evidence of mitral stenosis.  5. The aortic valve is tricuspid. Aortic valve regurgitation is trivial.  Moderate aortic valve stenosis. Aortic valve area, by VTI measures 1.06  cm. Aortic valve mean gradient measures 26.5 mmHg. Aortic valve Vmax  measures 3.30 m/s.  6. The inferior vena cava is normal in size with <50% respiratory  variability, suggesting right atrial pressure of 8 mmHg.   Laboratory Data: High Sensitivity Troponin:   Recent Labs  Lab 06/17/20 2110 06/18/20 1259 06/18/20 1315  TROPONINIHS 34* 328* 310*     Cardiac EnzymesNo results for input(s): TROPONINI in the last 168 hours. No results for input(s): TROPIPOC in the last 168 hours.  Chemistry Recent Labs  Lab 06/17/20 2011 06/18/20 1259 06/18/20 1319  NA 135 139 138  K 4.6 4.4 4.8  CL 101 107 106  CO2 22 21* 21*  GLUCOSE 112* 143* 163*  BUN 38* 39*  41*  CREATININE 1.96* 1.95* 1.93*  CALCIUM 9.2 9.0 8.6*  GFRNONAA 22* 22* 23*  GFRAA 26* 26* 26*  ANIONGAP 12 11 11     No results for input(s): PROT, ALBUMIN, AST, ALT, ALKPHOS, BILITOT in the last 168 hours. Hematology Recent Labs  Lab 06/17/20 2011 06/18/20 1259 06/18/20 1319  WBC 6.5 4.4 4.0  RBC 3.16* 2.85* 2.56*  HGB  9.0* 8.1* 7.3*  HCT 28.0* 25.6* 22.9*  MCV 88.6 89.8 89.5  MCH 28.5 28.4 28.5  MCHC 32.1 31.6 31.9  RDW 12.1 12.3 12.4  PLT 149* 143* 180   BNPNo results for input(s): BNP, PROBNP in the last 168 hours.  DDimer No results for input(s): DDIMER in the last 168 hours.  Radiology/Studies:  PERIPHERAL VASCULAR CATHETERIZATION  Result Date: 06/17/2020 Patient name: TYANNE DEROCHER MRN: [id number] DOB: 05-Aug-1930 Sex: female 06/17/2020 Pre-operative Diagnosis: critical right lower extremity ischemia with second toe ulceration Post-operative diagnosis:  Same Surgeon:  Eda Paschal. Donzetta Matters, MD Procedure Performed: 1.  Ultrasound-guided cannulation left common femoral artery 2.  CO2 aortogram and limited bilateral lower extremity angiography 3.  CO2 and contrasted right lower extremity angiography 4.  Fluoroscopic guided cannulation right peroneal artery 5.  Laser atherectomy with [place] 0.9 of right tp trunk and peroneal artery 6.  Drug-eluting stent placement right tp trunk and peroneal artery with 3 x 82m Synergy 7.  Mynx device closure left common femoral artery Indications: 86year old female with second toe ulceration.  She has decreased ABIs and toe pressures she is indicated for angiography with possible invention. Findings: Aorta and iliac segments were heavily calcified and tortuous but did not appear to have any flow-limiting stenosis.  Left lower extremity we evaluated the SFA this appeared patent with CO2.  The right lower extremity initially appeared to have some disease although this was calcified did not have any flow-limiting stenosis distally.  Further distally below the knee she had an occluded TP trunk reconstituted peroneal artery.  There is no identifiable posterior tibial artery.  The anterior tibial artery was initially patent and then occluded did not reconstitute.  The peroneal artery did reconstituted posterior tibial at the level of the ankle and fill the foot.  Disease of the TP trunk into  the peroneal artery measured approximately 40 mm.  After laser atherectomy balloon angioplasty we then performed drug-eluting stent placement and a completion there was no residual dissection or stenosis or previously was occluded.  Procedure:  The patient was identified in the holding area and taken to room 8.  The patient was then placed supine on the table and prepped and draped in the usual sterile fashion.  A time out was called.  Ultrasound was used to evaluate the left common femoral artery.  This was diseased although anterior wall was free of disease.  The area was anesthetized 1% lidocaine cannulated with direct ultrasound visualization a micropuncture needle followed by wire and sheath.  And images saved the permanent record.  We placed a Bentson wire followed by 5 FPakistansheath.  We placed an Omni catheter to the level of L1 performed aortogram followed by bilateral lower extremity runoff.  This was done with CO2 we only perform limited left lower extremity runoff.  We then crossed the bifurcation perform right lower extremity angiography with CO2.  We then went to the level of the knee performed angiography with CO2 then contrast and demonstrated the occluded TP trunk into the peroneal artery.  We then placed a Rosen wire followed by  a long 6 [place] sheath patient was fully heparinized.  We attempted to use a series of V 18 as well as ziliant 12g wire with cxi catheter and quick cross elect catheters.  Unfortunately we were unable to cross.  We then placed our straight catheter to just above the knee performed angiography and prepped out the right lower extremity at the level of the ankle and foot.  I used a micropuncture needle was able to first cannulate the peroneal vein fluoroscopically ultimately after several tries cannulate the peroneal artery.  I then placed a micropuncture sheath.  I used a V 18 and CXI catheter was able to cross into the popliteal artery and confirmed intraluminal access.  I  then snared a [place] core wire through and through.  We then performed laser atherectomy of the affected area of the TP trunk and peroneal artery.  We then primarily ballooned where there was residual dissection and then primarily stented with drug-eluting stent 3 x 48 mm.  Completion demonstrated no residual stenosis.  I then brought the original 3 mm balloon back into the stent and inflated at 2 atm.  The peroneal artery sheath was removed and the wire was retracted into the balloon.  We held pressure overlying the peroneal access site.  We then after 3 minutes released the balloon down remove the balloon wire.  We exchanged over a Rosen wire for short 6 French sheath deployed a minx device.  This deployed well.  She tolerated procedure without immediate complication. Contrast: 70cc Brandon C. Donzetta Matters, MD [facility] Office: [phone] Pager: [phone]   Assessment and Plan:  1. Elevated troponin/demand ischemia -She underwent peripheral vascular procedure yesterday.  She developed hypotension with mildly elevated troponin.  Troponin peaked at 326 and trending down. -EKG with normal sinus rhythm and old anteroseptal infarct which is no different from prior EKG. -She has no chest pain. -Hemoglobin is trending down from 10.5-7.3 -Overall, this is consistent with demand ischemia/non-MI troponin elevation.  She has severe PAD and undoubtedly has coronary artery disease.  However, her troponin elevation is simply related to her anemia and hypotension.  This does not represent an acute coronary syndrome.  This should not be treated as such. -I recommended to continue treatment as you are doing.  Would recommend to work-up her hypotension and correct her anemia.  We did discuss the possibility of transfusion.  Given her PAD and elevated troponin indicative of underlying CAD, a hemoglobin goal of 8 is not unreasonable.  I will leave this to the discretion of vascular surgery.   Her pressure was stable at the time of my examination. -We will obtain an echocardiogram just to ensure there is no new wall motion abnormalities. -Overall she is bedridden and not that active.  I think medical management will be the best option for her.  2.  Hypotension/postoperative anemia -Hemoglobin has trended down from 10.5-7.3.  The left femoral cath site looks clean and dry without hematoma or bruit.  There is still the possibility of retroperitoneal bleed.  After discussion with vascular surgery they do not feel this is an issue. -Scanning her will be problematic especially with contrast.  Could consider noncontrast CT if indicated. -We did discuss a possible hemoglobin goal of 8 given her severe PAD and likely underlying CAD. -She does not appear septic.  Given that she is in the postoperative state I highly suspect her hypotension is related to her anemia. -Would also be cautious with fluids given  her known history of HFpEF.  3.  Heart failure with preserved ejection fraction, EF 65% -Hold blood pressure medications in the setting of anemia. -Would be cautious with fluids.  4.  Moderate aortic stenosis -Echo from May shows moderate left ear.  Murmur consistent with early peaking component which is more indicative of moderate left ear. -We will repeat an echocardiogram to evaluate for wall motion we will also reevaluate her aortic valve.  Gradients likely will be higher in the setting of anemia.  For questions or updates, please contact [place] Please consult [email or web address] for contact info under   Signed, [place] T. Audie Box, [place]  06/18/2020 4:16 PM

## 2020-06-18 NOTE — Evaluation (Addendum)
I agree with the following treatment note after review of the documentation. This session was performed under the supervision of a licensed clinician.   Leighton Ruff, PT, DPT  Acute Rehabilitation Services  Pager: [phone]  Physical Therapy Evaluation Patient Details Name: Erika Day MRN: [id number] DOB: 08-May-1930 Today's Date: 06/18/2020   History of Present Illness  Pt was admitted to the hospital for critical right lower extremity ischemia with second toe ulceration. Pt is s/p left common femoral access for right peroneal and TP trunk intervention.  Pt was noted to be hypotension with BP 60/40 shortly after surgery. Pt has PMH of HTN, HLD, DJD, T2DM, CVA and cancer  Clinical Impression  Pt was evaluated and assessed for the above diagnosis and impairments below. Pt required min assist with all bed mobility tasks assessed. Pt able to laterally scoot along EOB with mod A +2. BP was noted to be [facility] throughout.. Pt is wc bound and reports needing assist with transfers and ADL tasks. Unsure if husband can provide necessary assist, so recommending SNF level therapies at d/c. Pt would continue to benefit from acute therapy in order to ensure safety with functional tasks. Will continue to follow acutely.   Orthostatic BP:  Supine - 126/65; HR: 80,  sitting - 139/84 HR: 94, After sitting for a few mins - 127/59 HR: 80, return supine - 116/60 HR: 70    Follow Up Recommendations SNF;Supervision/Assistance - 24 hour (max HH services if pt refuses. )    Equipment Recommendations  3in1 (PT)    Recommendations for Other Services       Precautions / Restrictions Precautions Precautions: Fall Restrictions Weight Bearing Restrictions: No      Mobility  Bed Mobility Overal bed mobility: Needs Assistance Bed Mobility: Supine to Sit;Sit to Supine     Supine to sit: Min assist;HOB elevated Sit to supine: Min assist;HOB elevated   General bed mobility comments: pt required min  assist to scoot hips forward along with increased time to get to EOB  Transfers Overall transfer level: Needs assistance   Transfers: Lateral/Scoot Transfers          Lateral/Scoot Transfers: Mod assist;+2 physical assistance General transfer comment: RN wanted to keep pt in bed, so practiced lateral scooting at EOB.  Required mod A +2 for scooting.   Ambulation/Gait                Stairs            Wheelchair Mobility    Modified Rankin (Stroke Patients Only)       Balance Overall balance assessment: Needs assistance Sitting-balance support: Bilateral upper extremity supported;Feet supported Sitting balance-Leahy Scale: Poor Sitting balance - Comments: pt required min guard for safety with sitting EOB initially but was able to maintain sitting balance with supervision assitance. pt noted to be kyphotic and with forward head posture                                     Pertinent Vitals/Pain Pain Assessment: No/denies pain    Home Living Family/patient expects to be discharged to:: Private residence Living Arrangements: Spouse/significant other Available Help at Discharge: Family;Available 24 hours/day;Personal care attendant Type of Home: House Home Access: Ramped entrance     Home Layout: One level Home Equipment: Wheelchair - manual;Bedside commode;Shower seat - built in Additional Comments: Pt remains in wc or bed most all day. personal  care attendent assists pt 5 days/week (5 hours M-F); husband lives with her to help with ADLs. Granddaughter lives close by and can help intermittently    Prior Function Level of Independence: Needs assistance   Gait / Transfers Assistance Needed: Pt reports sometimes needing assistance for squat pivot transfers to wc and toilet  ADL's / Homemaking Assistance Needed: requires assistance with bathing and dressing from husband and Aide        Hand Dominance        Extremity/Trunk Assessment    Upper Extremity Assessment Upper Extremity Assessment: Generalized weakness    Lower Extremity Assessment Lower Extremity Assessment: Generalized weakness    Cervical / Trunk Assessment Cervical / Trunk Assessment: Kyphotic  Communication   Communication: No difficulties  Cognition Arousal/Alertness: Awake/alert Behavior During Therapy: WFL for tasks assessed/performed Overall Cognitive Status: No family/caregiver present to determine baseline cognitive functioning                                        General Comments General comments (skin integrity, edema, etc.): Orthostatic BP: Supine - 126/65 (80), sitting - 139/84 (94), 3 min sitting - 127/59 (80), return supine - 116/60 (70)    Exercises     Assessment/Plan    PT Assessment Patient needs continued PT services  PT Problem List Decreased strength;Decreased range of motion;Decreased activity tolerance;Decreased balance;Decreased mobility;Decreased coordination;Cardiopulmonary status limiting activity       PT Treatment Interventions DME instruction;Wheelchair mobility training;Patient/family education;Neuromuscular re-education;Balance training;Therapeutic exercise;Functional mobility training;Therapeutic activities    PT Goals (Current goals can be found in the Care Plan section)  Acute Rehab PT Goals Patient Stated Goal: get home PT Goal Formulation: With patient Time For Goal Achievement: 07/02/20 Potential to Achieve Goals: Fair    Frequency Min 3X/week   Barriers to discharge Other (comment) Unsure if husband able to physically assist    Co-evaluation               AM-PAC PT "6 Clicks" Mobility  Outcome Measure Help needed turning from your back to your side while in a flat bed without using bedrails?: None Help needed moving from lying on your back to sitting on the side of a flat bed without using bedrails?: A Little Help needed moving to and from a bed to a chair (including a  wheelchair)?: A Lot Help needed standing up from a chair using your arms (e.g., wheelchair or bedside chair)?: Total Help needed to walk in hospital room?: Total Help needed climbing 3-5 steps with a railing? : Total 6 Click Score: 12    End of Session   Activity Tolerance: Patient tolerated treatment well Patient left: in bed;with call bell/phone within reach;with bed alarm set Nurse Communication: Mobility status PT Visit Diagnosis: Muscle weakness (generalized) (M62.81);Unsteadiness on feet (R26.81)    Time: 2130-8657 PT Time Calculation (min) (ACUTE ONLY): 23 min   Charges:   PT Evaluation $PT Eval Moderate Complexity: 1 Mod PT Treatments $Therapeutic Activity: 8-22 mins       Gloriann Loan, SPT  Acute Rehabilitation Services  Office: [phone]  06/18/2020, 3:11 PM

## 2020-06-19 ENCOUNTER — Inpatient Hospital Stay (HOSPITAL_COMMUNITY): Payer: Medicare Other

## 2020-06-19 ENCOUNTER — Ambulatory Visit: Payer: Medicare Other | Admitting: Internal Medicine

## 2020-06-19 DIAGNOSIS — I361 Nonrheumatic tricuspid (valve) insufficiency: Secondary | ICD-10-CM

## 2020-06-19 DIAGNOSIS — I340 Nonrheumatic mitral (valve) insufficiency: Secondary | ICD-10-CM

## 2020-06-19 DIAGNOSIS — I351 Nonrheumatic aortic (valve) insufficiency: Secondary | ICD-10-CM

## 2020-06-19 LAB — CBC
HCT: 27.2 % — ABNORMAL LOW (ref 36.0–46.0)
Hemoglobin: 8.8 g/dL — ABNORMAL LOW (ref 12.0–15.0)
MCH: 28.7 pg (ref 26.0–34.0)
MCHC: 32.4 g/dL (ref 30.0–36.0)
MCV: 88.6 fL (ref 80.0–100.0)
Platelets: 128 10*3/uL — ABNORMAL LOW (ref 150–400)
RBC: 3.07 MIL/uL — ABNORMAL LOW (ref 3.87–5.11)
RDW: 12.7 % (ref 11.5–15.5)
WBC: 4.2 10*3/uL (ref 4.0–10.5)
nRBC: 0 % (ref 0.0–0.2)

## 2020-06-19 LAB — BASIC METABOLIC PANEL
Anion gap: 7 (ref 5–15)
BUN: 30 mg/dL — ABNORMAL HIGH (ref 8–23)
CO2: 23 mmol/L (ref 22–32)
Calcium: 9 mg/dL (ref 8.9–10.3)
Chloride: 109 mmol/L (ref 98–111)
Creatinine, Ser: 1.59 mg/dL — ABNORMAL HIGH (ref 0.44–1.00)
GFR calc Af Amer: 33 mL/min — ABNORMAL LOW (ref >60)
GFR calc non Af Amer: 29 mL/min — ABNORMAL LOW (ref >60)
Glucose, Bld: 92 mg/dL (ref 70–99)
Potassium: 4.1 mmol/L (ref 3.5–5.1)
Sodium: 139 mmol/L (ref 135–145)

## 2020-06-19 LAB — BPAM RBC
Blood Product Expiration Date: 202108242359
ISSUE DATE / TIME: 202108171838
Unit Type and Rh: 6200

## 2020-06-19 LAB — TYPE AND SCREEN
ABO/RH(D): A POS
Antibody Screen: NEGATIVE
Unit division: 0

## 2020-06-19 LAB — ECHOCARDIOGRAM COMPLETE
AR max vel: 1.04 cm2
AV Area VTI: 1.18 cm2
AV Area mean vel: 0.95 cm2
AV Mean grad: 21.8 mmHg
AV Peak grad: 34.9 mmHg
Ao pk vel: 2.95 m/s
Area-P 1/2: 3.77 cm2
Height: 62.5 in
P 1/2 time: 334 msec
S' Lateral: 2.45 cm
Weight: 2400 oz

## 2020-06-19 MED ORDER — CLOPIDOGREL BISULFATE 75 MG PO TABS: 75.0000 mg | ORAL_TABLET | Freq: Every day | ORAL | 11 refills | Status: DC

## 2020-06-19 NOTE — Discharge Instructions (Signed)
° °  [facility] ° °Discharge Instructions ° °Lower Extremity Angiogram; Angioplasty/Stenting ° °Please refer to the following instructions for your post-procedure care. Your surgeon or physician assistant will discuss any changes with you. ° °Activity ° °Avoid lifting more than 8 pounds (1 gallons of milk) for 72 hours (3 days) after your procedure. You may walk as much as you can tolerate. It's OK to drive after 72 hours. ° °Bathing/Showering ° °You may shower the day after your procedure. If you have a bandage, you may remove it at 24- 48 hours. Clean your incision site with mild soap and water. Pat the area dry with a clean towel. ° °Diet ° °Resume your pre-procedure diet. There are no special food restrictions following this procedure. All patients with peripheral vascular disease should follow a low fat/low cholesterol diet. In order to heal from your surgery, it is CRITICAL to get adequate nutrition. Your body requires vitamins, minerals, and protein. Vegetables are the best source of vitamins and minerals. Vegetables also provide the perfect balance of protein. Processed food has little nutritional value, so try to avoid this. ° °Medications ° °Resume taking all of your medications unless your doctor tells you not to. If your incision is causing pain, you may take over-the-counter pain relievers such as acetaminophen (Tylenol) ° °Follow Up ° °Follow up will be arranged at the time of your procedure. You may have an office visit scheduled or may be scheduled for surgery. Ask your surgeon if you have any questions. ° °Please call us immediately for any of the following conditions: °•Severe or worsening pain your legs or feet at rest or with walking. °•Increased pain, redness, drainage at your groin puncture site. °•Fever of 101 degrees or higher. °•If you have any mild or slow bleeding from your puncture site: lie down, apply firm constant pressure over the area with a piece of  gauze or a clean wash cloth for 30 minutes- no peeking!, call 911 right away if you are still bleeding after 30 minutes, or if the bleeding is heavy and unmanageable. ° °Reduce your risk factors of vascular disease: ° °Stop smoking. If you would like help call [facility] at 1-800-QUIT-NOW (1-800-784-8669) or [facility] at [phone]. °Manage your cholesterol °Maintain a desired weight °Control your diabetes °Keep your blood pressure down ° °If you have any questions, please call the office at [phone] ° °

## 2020-06-19 NOTE — Evaluation (Addendum)
Occupational Therapy Evaluation Patient Details Name: Erika Day MRN: [id number] DOB: 01/05/1930 Today's Date: 06/19/2020    History of Present Illness Pt was admitted to the hospital for critical right lower extremity ischemia with second toe ulceration. Pt is s/p left common femoral access for right peroneal and TP trunk intervention.  Pt was noted to be hypotension with BP 60/40 shortly after surgery. Pt has PMH of HTN, HLD, DJD, T2DM, CVA and cancer   Clinical Impression   This 86 y/o female presents with the above. PTA pt reports living at home with spouse, reports typically using w/c in the home with intermittent assist for w/c transfers. She reports receives assist for tub transfers as her w/c doesn't fit into her bathroom and her aide assists with ADL/iADL tasks (aide 6 days/wk). Pt very pleasant and willing to participate in therapy session. She was able to perform bed mobility and stand pivot transfers using RW, grossly with modA (+2 for safety) throughout, tolerating OOB to recliner during session. Pt requiring up to maxA for LB ADL, minA for seated UB ADL. BP stable throughout (see below) with max HR noted 115 bpm. Pt reports preference for home vs SNF at this time; feel she is currently at a SNF level and would likely benefit from continued therapies to progress her strength and independence with ADL and mobility. If pt to return home recommend she have hands on assist for all aspects of mobility/ADL initially to ensure safety and to reduce risk for falls. Recommend max HH services if pt to decline SNF and return home. Will continue to follow acutely.   BP supine: 104/52 (66) Sitting: 138/67 (88) Seated in recliner post transfer: 121/74 (88) End of session seated in recliner: 137/69 (90)    Follow Up Recommendations  Supervision/Assistance - 24 hour;SNF;Home health OT (pending progress/available caregiver assist)    Equipment Recommendations  3 in 1 bedside commode            Precautions / Restrictions Precautions Precautions: Fall Precaution Comments: monitor BP Restrictions Weight Bearing Restrictions: No      Mobility Bed Mobility Overal bed mobility: Needs Assistance Bed Mobility: Supine to Sit     Supine to sit: Mod assist     General bed mobility comments: assist for trunk and to scoot hips towards EOB, pt able to assist with scooting given increased time/effort   Transfers Overall transfer level: Needs assistance Equipment used: Rolling walker (2 wheeled) Transfers: Stand Pivot Transfers;Sit to/from Stand Sit to Stand: Mod assist;+2 safety/equipment Stand pivot transfers: Mod assist;+2 safety/equipment       General transfer comment: boosting and steadying assist with VCs for safe hand placement, increased effort with steadying assist provided for taking few steps to relincer     Balance Overall balance assessment: Needs assistance Sitting-balance support: Feet supported;No upper extremity supported Sitting balance-Leahy Scale: Fair Sitting balance - Comments: minguard for sitting balance without UE support    Standing balance support: Bilateral upper extremity supported Standing balance-Leahy Scale: Poor Standing balance comment: reliant on external assist                            ADL either performed or assessed with clinical judgement   ADL Overall ADL's : Needs assistance/impaired Eating/Feeding: Set up;Sitting   Grooming: Wash/dry face;Supervision/safety;Sitting   Upper Body Bathing: Min guard;Sitting   Lower Body Bathing: Moderate assistance;+2 for safety/equipment;Sitting/lateral leans;Sit to/from stand   Upper Body Dressing : Minimal assistance;Sitting  Lower Body Dressing: Maximal assistance;+2 for safety/equipment;Sitting/lateral leans;Sit to/from stand   Toilet Transfer: Moderate assistance;+2 for safety/equipment;Stand-pivot;RW Toilet Transfer Details (indicate cue type and reason): simulated via  transfer to [place] and Hygiene: Maximal assistance;+2 for safety/equipment;+2 for physical assistance;Sitting/lateral lean;Sit to/from stand       Functional mobility during ADLs: Moderate assistance;+2 for safety/equipment;Rolling walker (stand pivot transfers)       Vision Baseline Vision/History:  (low to no vision in R eye)       Perception     Praxis      Pertinent Vitals/Pain Pain Assessment: No/denies pain     Hand Dominance     Extremity/Trunk Assessment Upper Extremity Assessment Upper Extremity Assessment: Generalized weakness   Lower Extremity Assessment Lower Extremity Assessment: Defer to PT evaluation   Cervical / Trunk Assessment Cervical / Trunk Assessment: Kyphotic   Communication Communication Communication: No difficulties   Cognition Arousal/Alertness: Awake/alert Behavior During Therapy: WFL for tasks assessed/performed Overall Cognitive Status: No family/caregiver present to determine baseline cognitive functioning                                 General Comments: appears WFL for basic task, appears to be accurate historian with home setup, telling RN update from MD at start of session and with good recall   General Comments       Exercises     Shoulder Instructions      Home Living Family/patient expects to be discharged to:: Private residence Living Arrangements: Spouse/significant other Available Help at Discharge: Family;Available 24 hours/day;Personal care attendant Type of Home: House Home Access: Ramped entrance     Home Layout: One level     Bathroom Shower/Tub: Teacher, early years/pre: Standard Bathroom Accessibility: Yes   Home Equipment: Wheelchair - manual;Bedside commode;Tub bench;Hospital bed (?shower seat vs tub bench)   Additional Comments: Pt remains in wc or bed most all day. personal care attendent assists pt 5 days/week (5 hours M-F); husband lives  with her to help with ADLs. Granddaughter lives close by and can help intermittently      Prior Functioning/Environment Level of Independence: Needs assistance  Gait / Transfers Assistance Needed: Pt reports sometimes needing assistance for squat pivot transfers to wc and toilet - pt reports her w/c doesn't fit into bathroom so typically two people assist with taking few steps to tub ADL's / Homemaking Assistance Needed: requires assistance with bathing and dressing from husband and Aide, reports often does a sponge bath vs taking shower, has BSC for toileting since her w/c doesn't fit into the bathroom             OT Problem List: Decreased strength;Decreased range of motion;Decreased activity tolerance;Impaired balance (sitting and/or standing);Decreased safety awareness;Decreased knowledge of use of DME or AE;Decreased knowledge of precautions;Cardiopulmonary status limiting activity;Pain      OT Treatment/Interventions: Self-care/ADL training;Therapeutic exercise;DME and/or AE instruction;Therapeutic activities;Patient/family education;Balance training    OT Goals(Current goals can be found in the care plan section) Acute Rehab OT Goals Patient Stated Goal: get home OT Goal Formulation: With patient Time For Goal Achievement: 07/03/20 Potential to Achieve Goals: Good  OT Frequency: Min 2X/week   Barriers to D/C:            Co-evaluation              AM-PAC OT "6 Clicks" Daily Activity     Outcome Measure  Help from another person eating meals?: A Little Help from another person taking care of personal grooming?: A Little Help from another person toileting, which includes using toliet, bedpan, or urinal?: A Lot Help from another person bathing (including washing, rinsing, drying)?: A Lot Help from another person to put on and taking off regular upper body clothing?: A Little Help from another person to put on and taking off regular lower body clothing?: A Lot 6 Click  Score: 15   End of Session Equipment Utilized During Treatment: Rolling walker;Gait belt Nurse Communication: Mobility status  Activity Tolerance: Patient tolerated treatment well Patient left: in chair;with call bell/phone within reach;with chair alarm set  OT Visit Diagnosis: Other abnormalities of gait and mobility (R26.89);Muscle weakness (generalized) (M62.81);Unsteadiness on feet (R26.81)                Time: 2174-7159 OT Time Calculation (min): 26 min Charges:  OT General Charges $OT Visit: 1 Visit OT Evaluation $OT Eval Moderate Complexity: 1 Mod OT Treatments $Self Care/Home Management : 8-22 mins  Lou Cal, OT Acute Rehabilitation Services Pager [phone] Office [phone]   Raymondo Band 06/19/2020, 10:06 AM

## 2020-06-19 NOTE — Progress Notes (Signed)
PRBC finished infusing.  Patient tolerated infusion without any c/o distress.  No c/o of pain or discomfort noted from patient.

## 2020-06-19 NOTE — Progress Notes (Signed)
  Echocardiogram 2D Echocardiogram has been performed.  Randa Lynn Jovani Flury 06/19/2020, 4:26 PM

## 2020-06-19 NOTE — Progress Notes (Addendum)
[facility]  Subjective  - Feels good.   Objective 117/61 87 97.8 F (36.6 C) (Oral) 20 97%  Intake/Output Summary (Last 24 hours) at 06/19/2020 5638 Last data filed at 06/19/2020 0400 Gross per 24 hour  Intake 2976.94 ml  Output 900 ml  Net 2076.94 ml    A & O x 3 Feet warm and well perfused right foot with DP/PT doppler signals Left groin soft without hematoma Lungs non labored   Assessment/Planning: POD # 1  Left femoral access. Laser atherectomy with [place]on 0.9 of right tp trunk and peroneal artery.  Drug-eluting stent placement right tp trunk and peroneal artery with 3 x 35mm Synergy    Hypotension with anemia and demand elevated troponin level that are trending down.   She received 1 unit PRBC pending am labs. Denise CP or SOB Discharge on Plavix and Crestor Possible discharge home today   Roxy Horseman 06/19/2020 8:07 AM --  Laboratory Lab Results: Recent Labs    06/18/20 1259 06/18/20 1319  WBC 4.4 4.0  HGB 8.1* 7.3*  HCT 25.6* 22.9*  PLT 143* 180   BMET Recent Labs    06/18/20 1259 06/18/20 1319  NA 139 138  K 4.4 4.8  CL 107 106  CO2 21* 21*  GLUCOSE 143* 163*  BUN 39* 41*  CREATININE 1.95* 1.93*  CALCIUM 9.0 8.6*    COAG Lab Results  Component Value Date   INR 1.40 08/18/2011   INR 1.47 08/18/2011   No results found for: PTT  I have independently interviewed and examined patient and agree with PA assessment and plan above.   Ishmel Acevedo C. Donzetta Matters, MD [facility] Office: [phone] Pager: [phone]

## 2020-06-19 NOTE — Progress Notes (Signed)
Cardiology Progress Note  Patient ID: AELA BOHAN MRN: [id number] DOB: [birth year] Date of Encounter: 06/19/2020  Primary Cardiologist: No primary care provider on file.  Subjective   Chief Complaint: No complaints.   HPI: Hgb responded appropriately. Echo pending.   ROS:  All other ROS reviewed and negative. Pertinent positives noted in the HPI.     Inpatient Medications  Scheduled Meds:  clopidogrel  75 mg Oral Q breakfast   pantoprazole  40 mg Oral Daily   potassium chloride  20-40 mEq Oral Once   rosuvastatin  10 mg Oral Daily   sodium chloride flush  10-40 mL Intracatheter Q12H   sodium chloride flush  3 mL Intravenous Q12H   Continuous Infusions:  sodium chloride     sodium chloride 100 mL/hr at 06/18/20 2224   PRN Meds: sodium chloride, acetaminophen, alum & mag hydroxide-simeth, guaiFENesin-dextromethorphan, hydrALAZINE, labetalol, metoprolol tartrate, morphine injection, ondansetron, oxyCODONE, phenol, sodium chloride flush, sodium chloride flush   Vital Signs   Vitals:   06/18/20 2030 06/19/20 0000 06/19/20 0400 06/19/20 0800  BP: (!) 113/55 (!) 116/91 (!) 105/49 117/61  Pulse:   79 87  Resp: 16 20 19 20   Temp: 97.7 F (36.5 C)  98.3 F (36.8 C) 97.8 F (36.6 C)  TempSrc: Oral  Oral Oral  SpO2: 99% 95% 93% 97%  Weight:      Height:        Intake/Output Summary (Last 24 hours) at 06/19/2020 0858 Last data filed at 06/19/2020 0400 Gross per 24 hour  Intake 2976.94 ml  Output 900 ml  Net 2076.94 ml   Last 3 Weights 06/17/2020 06/14/2020 06/12/2020  Weight (lbs) 150 lb 156 lb (No Data)  Weight (kg) 68.04 kg 70.761 kg (No Data)      Telemetry  Overnight telemetry shows SR 70s, which I personally reviewed.   ECG  The most recent ECG shows SR 73 septal infarct, which I personally reviewed.   Physical Exam   Vitals:   06/18/20 2030 06/19/20 0000 06/19/20 0400 06/19/20 0800  BP: (!) 113/55 (!) 116/91 (!) 105/49 117/61  Pulse:   79 87   Resp: 16 20 19 20   Temp: 97.7 F (36.5 C)  98.3 F (36.8 C) 97.8 F (36.6 C)  TempSrc: Oral  Oral Oral  SpO2: 99% 95% 93% 97%  Weight:      Height:         Intake/Output Summary (Last 24 hours) at 06/19/2020 0858 Last data filed at 06/19/2020 0400 Gross per 24 hour  Intake 2976.94 ml  Output 900 ml  Net 2076.94 ml    Last 3 Weights 06/17/2020 06/14/2020 06/12/2020  Weight (lbs) 150 lb 156 lb (No Data)  Weight (kg) 68.04 kg 70.761 kg (No Data)    Body mass index is 27 kg/m.   General: Well nourished, well developed, in no acute distress Head: Atraumatic, normal size  Eyes: PEERLA, EOMI  Neck: Supple, no JVD Endocrine: No thryomegaly Cardiac: Normal S1, S2; RRR; no murmurs, rubs, or gallops Lungs: Clear to auscultation bilaterally, no wheezing, rhonchi or rales  Abd: Soft, nontender, no hepatomegaly  Ext: trace edema, L and dry femoral cath site without hematoma  Musculoskeletal: No deformities, BUE and BLE strength normal and equal Skin: Warm and dry, no rashes   Neuro: Alert and oriented to person, place, time, and situation, CNII-XII grossly intact, no focal deficits  Psych: Normal mood and affect   Labs  High Sensitivity Troponin:   Recent Labs  Lab 06/17/20 2110 06/18/20 1259 06/18/20 1315  TROPONINIHS 34* 328* 310*     Cardiac EnzymesNo results for input(s): TROPONINI in the last 168 hours. No results for input(s): TROPIPOC in the last 168 hours.  Chemistry Recent Labs  Lab 06/18/20 1259 06/18/20 1319 06/19/20 0745  NA 139 138 139  K 4.4 4.8 4.1  CL 107 106 109  CO2 21* 21* 23  GLUCOSE 143* 163* 92  BUN 39* 41* 30*  CREATININE 1.95* 1.93* 1.59*  CALCIUM 9.0 8.6* 9.0  GFRNONAA 22* 23* 29*  GFRAA 26* 26* 33*  ANIONGAP 11 11 7     Hematology Recent Labs  Lab 06/18/20 1259 06/18/20 1319 06/19/20 0745  WBC 4.4 4.0 4.2  RBC 2.85* 2.56* 3.07*  HGB 8.1* 7.3* 8.8*  HCT 25.6* 22.9* 27.2*  MCV 89.8 89.5 88.6  MCH 28.4 28.5 28.7  MCHC 31.6 31.9 32.4   RDW 12.3 12.4 12.7  PLT 143* 180 128*   BNPNo results for input(s): BNP, PROBNP in the last 168 hours.  DDimer No results for input(s): DDIMER in the last 168 hours.   Radiology  PERIPHERAL VASCULAR CATHETERIZATION  Result Date: 06/17/2020 Patient name: KAIANA MARION MRN: [id number] DOB: 08-20-1930 Sex: female 06/17/2020 Pre-operative Diagnosis: critical right lower extremity ischemia with second toe ulceration Post-operative diagnosis:  Same Surgeon:  Eda Paschal. Donzetta Matters, MD Procedure Performed: 1.  Ultrasound-guided cannulation left common femoral artery 2.  CO2 aortogram and limited bilateral lower extremity angiography 3.  CO2 and contrasted right lower extremity angiography 4.  Fluoroscopic guided cannulation right peroneal artery 5.  Laser atherectomy with Auryon 0.9 of right tp trunk and peroneal artery 6.  Drug-eluting stent placement right tp trunk and peroneal artery with 3 x 62m Synergy 7.  Mynx device closure left common femoral artery Indications: 86year old female with second toe ulceration.  She has decreased ABIs and toe pressures she is indicated for angiography with possible invention. Findings: Aorta and iliac segments were heavily calcified and tortuous but did not appear to have any flow-limiting stenosis.  Left lower extremity we evaluated the SFA this appeared patent with CO2.  The right lower extremity initially appeared to have some disease although this was calcified did not have any flow-limiting stenosis distally.  Further distally below the knee she had an occluded TP trunk reconstituted peroneal artery.  There is no identifiable posterior tibial artery.  The anterior tibial artery was initially patent and then occluded did not reconstitute.  The peroneal artery did reconstituted posterior tibial at the level of the ankle and fill the foot.  Disease of the TP trunk into the peroneal artery measured approximately 40 mm.  After laser atherectomy balloon angioplasty we then performed  drug-eluting stent placement and a completion there was no residual dissection or stenosis or previously was occluded.  Procedure:  The patient was identified in the holding area and taken to room 8.  The patient was then placed supine on the table and prepped and draped in the usual sterile fashion.  A time out was called.  Ultrasound was used to evaluate the left common femoral artery.  This was diseased although anterior wall was free of disease.  The area was anesthetized 1% lidocaine cannulated with direct ultrasound visualization a micropuncture needle followed by wire and sheath.  And images saved the permanent record.  We placed a Bentson wire followed by 5 FPakistansheath.  We placed an Omni catheter to the level of L1 performed aortogram followed by bilateral lower  extremity runoff.  This was done with CO2 we only perform limited left lower extremity runoff.  We then crossed the bifurcation perform right lower extremity angiography with CO2.  We then went to the level of the knee performed angiography with CO2 then contrast and demonstrated the occluded TP trunk into the peroneal artery.  We then placed a Rosen wire followed by a long 6 [place] sheath patient was fully heparinized.  We attempted to use a series of V 18 as well as ziliant 12g wire with cxi catheter and quick cross elect catheters.  Unfortunately we were unable to cross.  We then placed our straight catheter to just above the knee performed angiography and prepped out the right lower extremity at the level of the ankle and foot.  I used a micropuncture needle was able to first cannulate the peroneal vein fluoroscopically ultimately after several tries cannulate the peroneal artery.  I then placed a micropuncture sheath.  I used a V 18 and CXI catheter was able to cross into the popliteal artery and confirmed intraluminal access.  I then snared a [place] core wire through and through.  We then performed laser atherectomy of the affected area of  the TP trunk and peroneal artery.  We then primarily ballooned where there was residual dissection and then primarily stented with drug-eluting stent 3 x 48 mm.  Completion demonstrated no residual stenosis.  I then brought the original 3 mm balloon back into the stent and inflated at 2 atm.  The peroneal artery sheath was removed and the wire was retracted into the balloon.  We held pressure overlying the peroneal access site.  We then after 3 minutes released the balloon down remove the balloon wire.  We exchanged over a Rosen wire for short 6 French sheath deployed a minx device.  This deployed well.  She tolerated procedure without immediate complication. Contrast: 70cc Brandon C. Donzetta Matters, MD [facility] Office: [phone] Pager: [phone]   Cardiac Studies  TTE 03/27/2020 1. Left ventricular ejection fraction, by estimation, is 60 to 65%. The  left ventricle has normal function. The left ventricle has no regional  wall motion abnormalities. There is moderate concentric left ventricular  hypertrophy. Left ventricular  diastolic function could not be evaluated.  2. Right ventricular systolic function is normal. The right ventricular  size is normal. There is normal pulmonary artery systolic pressure. The  estimated right ventricular systolic pressure is 75.1 mmHg.  3. Left atrial size was mildly dilated.  4. The mitral valve is degenerative. No evidence of mitral valve  regurgitation. No evidence of mitral stenosis.  5. The aortic valve is tricuspid. Aortic valve regurgitation is trivial.  Moderate aortic valve stenosis. Aortic valve area, by VTI measures 1.06  cm. Aortic valve mean gradient measures 26.5 mmHg. Aortic valve Vmax  measures 3.30 m/s.  6. The inferior vena cava is normal in size with <50% respiratory  variability, suggesting right atrial pressure of 8 mmHg.   Patient Profile  CHRIS NARASIMHAN is a 86 y.o. female with stage III CKD, MGUS,  HFpEF, HTN who was admitted 06/17/2020 with R toe gangrene. S/p peripheral intervention. Cardiology consulted for hypotension and elevated troponin.   Assessment & Plan   1. Elevated Troponin -no CP. Mild elevation in setting of hypotension and post op anemia. EKG without acute changes. Suspect demand ischemia. Likely has some CAD given severe PAD.  -echo pending -Hgb responded to transfusion. Keep Hgb >8.0. -minimal troponin elevation  and flat -as long as echo is without WMA, can discharge -she is not active an bedridden. I favor medical therapy. She is on plavix and statin. Can consider BB as outpatient.   2. HFpEF -stable. No more IVF  3. Moderate AS -echo in May with moderate disease -I will have her follow with me in clinic   [place] will sign off.   Medication Recommendations:  Hgb >8. Continue plavix and statin. Other recommendations (labs, testing, etc):  DC pending echo Follow up as an outpatient:  She will follow with me in 4-6 weeks.   For questions or updates, please contact [place] Please consult [email or web address] for contact info under   Time Spent with Patient: I have spent a total of 25 minutes with patient reviewing hospital notes, telemetry, EKGs, labs and examining the patient as well as establishing an assessment and plan that was discussed with the patient.  > 50% of time was spent in direct patient care.    Signed, Addison Naegeli. Audie Box, [place]  06/19/2020 8:58 AM

## 2020-06-21 ENCOUNTER — Ambulatory Visit (INDEPENDENT_AMBULATORY_CARE_PROVIDER_SITE_OTHER): Payer: Medicare Other | Admitting: Podiatry

## 2020-06-21 ENCOUNTER — Other Ambulatory Visit: Payer: Self-pay

## 2020-06-21 DIAGNOSIS — E1165 Type 2 diabetes mellitus with hyperglycemia: Secondary | ICD-10-CM

## 2020-06-21 DIAGNOSIS — L97511 Non-pressure chronic ulcer of other part of right foot limited to breakdown of skin: Secondary | ICD-10-CM | POA: Diagnosis not present

## 2020-06-25 ENCOUNTER — Encounter: Payer: Self-pay | Admitting: Podiatry

## 2020-06-25 NOTE — Discharge Summary (Signed)
Patient ID: Erika Day MRN: [id number] DOB/AGE: Sep 08, 1930 86 y.o.  Admit date: 06/17/2020 Discharge date: 06/19/2020  Admission Diagnosis: Critical limb ischemia  Discharge Diagnoses:  Critical limb ischemia Heart failure with moderate aortic stenosis  Secondary Diagnoses: Active Problems:   PAD (peripheral artery disease) (HCC)   Procedures: 1.  Ultrasound-guided cannulation left common femoral artery 2.  CO2 aortogram and limited bilateral lower extremity angiography 3.  CO2 and contrasted right lower extremity angiography 4.  Fluoroscopic guided cannulation right peroneal artery 5.  Laser atherectomy with Auryon 0.9 of right tp trunk and peroneal artery 6.  Drug-eluting stent placement right tp trunk and peroneal artery with 3 x 43mm Synergy  7.  Mynx device closure left common femoral artery  Discharged Condition: good  Hospital Course: Patient was admitted for critical right lower extremity ischemia underwent the above-noted procedure.  Postoperatively she did have some hypotension and some epigastric pain.  Labs were checked and she was noted to be somewhat anemic.  She stayed overnight for this reason cardiology was consulted the following morning.  She was transfused did respond appropriately.  She was planned for echocardiogram which demonstrated ejection fraction 60 to 65%.  With this she was thought stable for discharge and was discharged home with her daughter.  Consults:  Treatment Team:  L[facility]cardiology, [place], MD  Significant Diagnostic Studies: CBC CBC Latest Ref Rng & Units 06/19/2020 06/18/2020 06/18/2020  WBC 4.0 - 10.5 K/uL 4.2 4.0 4.4  Hemoglobin 12.0 - 15.0 g/dL 8.8(L) 7.3(L) 8.1(L)  Hematocrit 36 - 46 % 27.2(L) 22.9(L) 25.6(L)  Platelets 150 - 400 K/uL 128(L) 180 143(L)       COAG Lab Results  Component Value Date   INR 1.40 08/18/2011   INR 1.47 08/18/2011   No results found for: PTT  Disposition: Discharge disposition: 01-Home  or Self Care       Discharge Instructions    Call MD for:  redness, tenderness, or signs of infection (pain, swelling, bleeding, redness, odor or green/yellow discharge around incision site)   Complete by: As directed    Call MD for:  severe or increased pain, loss or decreased feeling  in affected limb(s)   Complete by: As directed    Call MD for:  temperature >100.5   Complete by: As directed    Resume previous diet   Complete by: As directed      Allergies as of 06/19/2020      Reactions   Morphine And Related Other (See Comments)   Blood sugar dropped one-time      Medication List    TAKE these medications   bisacodyl 5 MG EC tablet Commonly known as: DULCOLAX Take 5 mg by mouth daily as needed for moderate constipation.   cetirizine 10 MG tablet Commonly known as: ZYRTEC 1 tab by mouth once daily with evening meal for allergies What changed:   how much to take  how to take this  when to take this  reasons to take this  additional instructions   clopidogrel 75 MG tablet Commonly known as: Plavix Take 1 tablet (75 mg total) by mouth daily.   clopidogrel 75 MG tablet Commonly known as: PLAVIX Take 1 tablet (75 mg total) by mouth daily with breakfast.   dorzolamide-timolol 22.3-6.8 MG/ML ophthalmic solution Commonly known as: COSOPT Place 1 drop into both eyes 2 (two) times daily.   furosemide 40 MG tablet Commonly known as: Lasix Take 1 tablet (40 mg total) by mouth  daily.   latanoprost 0.005 % ophthalmic solution Commonly known as: XALATAN Place 1 drop into both eyes at bedtime.   lisinopril 10 MG tablet Commonly known as: ZESTRIL Take 1 tablet (10 mg total) by mouth daily.   pantoprazole 40 MG tablet Commonly known as: PROTONIX 1 tab by mouth daily at 10 pm on empty stomach What changed:   how much to take  how to take this  when to take this  additional instructions   polyethylene glycol 17 g packet Commonly known as: MIRALAX /  GLYCOLAX Take 17 g by mouth daily as needed. What changed: reasons to take this   potassium chloride 10 MEQ tablet Commonly known as: KLOR-CON TAKE 1 TABLET(10 MEQ) BY MOUTH DAILY What changed: See the new instructions.   rosuvastatin 10 MG tablet Commonly known as: Crestor Take 1 tablet (10 mg total) by mouth daily.   senna-docusate 8.6-50 MG tablet Commonly known as: Senokot-S Take 1 tablet by mouth at bedtime as needed for mild constipation.       Follow-up Information    Waynetta Sandy, MD Follow up in 4 week(s).   Specialties: Vascular Surgery, Cardiology Why: office will call Contact information: [place] [address] [phone]        Geralynn Rile, MD Follow up on 07/26/2020.   Specialties: Internal Medicine, Cardiology, Radiology Why: at 0830am with Dr. Tyrone Schimke information: [address] [place] [address] [phone]               Signed:  Eda Paschal. Donzetta Matters, MD [facility] Office: [phone] Pager: [phone]  06/25/2020, 12:55 PM

## 2020-06-25 NOTE — Progress Notes (Signed)
Subjective:  Patient ID: Erika Day, female    DOB: 1930/01/03,  MRN: [id number]  Chief Complaint  Patient presents with  . Foot Ulcer       Wound ck, right 2nd toe turning black    86 y.o. female presents for wound care.  Patient presents with a follow-up of right second digit ulceration that is doing really well.  She states is completely resolved.  She had ABIs PVRs done which showed decreasing vascular flow to the right lower extremity.  She had a recent intervention done by a specialist at [facility] clinic.  For which he performed the procedures listed below.  She denies any other acute complaints.  Marland Kitchen  Ultrasound-guided cannulation left common femoral artery 2.  CO2 aortogram and limited bilateral lower extremity angiography 3.  CO2 and contrasted right lower extremity angiography 4.  Fluoroscopic guided cannulation right peroneal artery 5.  Laser atherectomy with Auryon 0.9 of right tp trunk and peroneal artery 6.  Drug-eluting stent placement right tp trunk and peroneal artery with 3 x 48mm Synergy  7.  Mynx device closure left common femoral artery  Review of Systems: Negative except as noted in the HPI. Denies N/V/F/Ch.  Past Medical History:  Diagnosis Date  . Acid reflux disease   . Cancer ([place])   . CLL (chronic lymphocytic leukemia) ([place]) 05/15/2016  . DDD (degenerative disc disease), lumbar   . Degenerative joint disease   . Diabetes mellitus without complication [facility]    Patient states this was a misdiagnosis from years ago  . Dyslipidemia   . History of diabetes mellitus 04/19/2017  . Hypertension   . 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 Panic attacks   . Pneumonia 02/2016  . 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 Stroke [facility]) age 38 yo   poorly controlled DM and Hypertension  . Volvulus of sigmoid colon (HCC)     Current Outpatient Medications:  .  bisacodyl (DULCOLAX) 5 MG EC tablet, Take 5 mg by mouth  daily as needed for moderate constipation., Disp: , Rfl:  .  cetirizine (ZYRTEC) 10 MG tablet, 1 tab by mouth once daily with evening meal for allergies (Patient taking differently: Take 10 mg by mouth daily as needed for allergies. ), Disp: 30 tablet, Rfl: 11 .  clopidogrel (PLAVIX) 75 MG tablet, Take 1 tablet (75 mg total) by mouth daily., Disp: 30 tablet, Rfl: 11 .  clopidogrel (PLAVIX) 75 MG tablet, Take 1 tablet (75 mg total) by mouth daily with breakfast., Disp: 30 tablet, Rfl: 11 .  dorzolamide-timolol (COSOPT) 22.3-6.8 MG/ML ophthalmic solution, Place 1 drop into both eyes 2 (two) times daily. , Disp: , Rfl:  .  furosemide (LASIX) 40 MG tablet, Take 1 tablet (40 mg total) by mouth daily., Disp: 30 tablet, Rfl: 11 .  latanoprost (XALATAN) 0.005 % ophthalmic solution, Place 1 drop into both eyes at bedtime. , Disp: , Rfl:  .  lisinopril (ZESTRIL) 10 MG tablet, Take 1 tablet (10 mg total) by mouth daily., Disp: 90 tablet, Rfl: 3 .  pantoprazole (PROTONIX) 40 MG tablet, 1 tab by mouth daily at 10 pm on empty stomach (Patient taking differently: Take 40 mg by mouth daily. ), Disp: 30 tablet, Rfl: 11 .  polyethylene glycol (MIRALAX / GLYCOLAX) 17 g packet, Take 17 g by mouth daily as needed. (Patient taking differently: Take 17 g by mouth daily as needed for mild constipation. ), Disp: 14  each, Rfl: 0 .  potassium chloride (KLOR-CON) 10 MEQ tablet, TAKE 1 TABLET(10 MEQ) BY MOUTH DAILY (Patient taking differently: Take 10 mEq by mouth daily. ), Disp: 30 tablet, Rfl: 11 .  rosuvastatin (CRESTOR) 10 MG tablet, Take 1 tablet (10 mg total) by mouth daily., Disp: 30 tablet, Rfl: 11 .  senna-docusate (SENOKOT-S) 8.6-50 MG tablet, Take 1 tablet by mouth at bedtime as needed for mild constipation. (Patient not taking: Reported on 06/18/2020), Disp: 30 tablet, Rfl: 0  Social History   Tobacco Use  Smoking Status Former Smoker  . Types: Cigarettes  . Quit date: 02/18/1990  . Years since quitting: 30.3    Smokeless Tobacco Former Systems developer  . Types: Snuff  . Quit date: 02/18/1990    Allergies  Allergen Reactions  . Morphine And Related Other (See Comments)    Blood sugar dropped one-time   Objective:  There were no vitals filed for this visit. There is no height or weight on file to calculate BMI. Constitutional Well developed. Well nourished.  Vascular Dorsalis pedis pulses nonpalpable bilaterally. Posterior tibial pulsesnonm palpable bilaterally. Capillary refill normal to all digits.  No cyanosis or clubbing noted. Pedal hair growth normal.  Neurologic Normal speech. Oriented to person, place, and time. Protective sensation absent Nail Exam: Pt has thick disfigured discolored nails with subungual debris noted bilateral entire nail hallux through fifth toenails.  Pain on palpation to the nails.  Dermatologic  right second digit ulcer completely reepithelialized.  Debridement was carried in standard technique without any new ulceration formation.  No clinical signs of infection.  Does not probe down to bone.  Orthopedic: No pain to palpation either foot.   Radiographs: None Assessment:   1. Right second toe ulcer, limited to breakdown of skin ([place])   2. Uncontrolled type 2 diabetes mellitus with hyperglycemia ([place])    Plan:  Patient was evaluated and treated and all questions answered.  Ulcer right second toe ulcer limited to the skin -Clinically healed and resolved.  The wound has completely reepithelialized.  I have asked her to continue monitoring the second toe to make sure that has does not recur.  At this time I feel comfortable discharging the patient from my care.  If any foot and ankle issues arise in the future I have asked her to come back and see me. -I discussed with her the vascular component of importance to the lower extremity and its relationship to the wound care.  Given that patient has had complete resolve meant pain it appears that patient may have adequate flow  to the right lower extremity to maintain and prevent ulceration. -I have asked her to come back and see me right away if anything changes.  Patient states understanding  Boneta Lucks, DPM    No follow-ups on file.  follow-up.  No follow-ups on file.

## 2020-06-28 ENCOUNTER — Other Ambulatory Visit: Payer: Self-pay

## 2020-06-28 ENCOUNTER — Telehealth: Payer: Self-pay

## 2020-06-28 ENCOUNTER — Ambulatory Visit (INDEPENDENT_AMBULATORY_CARE_PROVIDER_SITE_OTHER): Payer: Medicare Other | Admitting: Podiatry

## 2020-06-28 DIAGNOSIS — I739 Peripheral vascular disease, unspecified: Secondary | ICD-10-CM | POA: Diagnosis not present

## 2020-06-28 DIAGNOSIS — E1165 Type 2 diabetes mellitus with hyperglycemia: Secondary | ICD-10-CM

## 2020-06-28 DIAGNOSIS — L97511 Non-pressure chronic ulcer of other part of right foot limited to breakdown of skin: Secondary | ICD-10-CM | POA: Diagnosis not present

## 2020-06-28 NOTE — Telephone Encounter (Signed)
Colletta Maryland with [facility] [phone]) stating patient legs were swollen again and right second toe was also turning black and leaking fluid as if there could be a infection. Patient was to follow up with Dr. Posey Pronto if there were any issues patient saw Dr. Posey Pronto on last Friday and wound was healed and no discoloration.   [facility] put [atient on schedule to see patel at 3:30 pm today. Patient did not make appointment because they could not get her in the car. Colletta Maryland spoke with Dr. Serita Grit partner Dr. Earleen Newport and they agreed to wait for patient and to help get her into the office. Dr. Amil Amen is aware of all of this.

## 2020-07-01 ENCOUNTER — Encounter: Payer: Self-pay | Admitting: Podiatry

## 2020-07-01 NOTE — Progress Notes (Signed)
Subjective:  Patient ID: Erika Day, female    DOB: [birth year],  MRN: [id number]  Chief Complaint  Patient presents with  . Follow-up    NAIL CHECJ  . Ankle Pain    RT ANKLE PAIN     86 y.o. female presents for wound care.  Patient presents with follow-up of right second digit ulceration that may have recurred.  Patient states that there is some bruising as well as discoloration.  She does not have any pain.  It was reepithelialized when I saw her about a week and a half ago but has recurred.  I have asked her to apply triple antibiotic and Band-Aid to keep it covered it appears to be very superficial in nature.  Marland Kitchen  Ultrasound-guided cannulation left common femoral artery 2.  CO2 aortogram and limited bilateral lower extremity angiography 3.  CO2 and contrasted right lower extremity angiography 4.  Fluoroscopic guided cannulation right peroneal artery 5.  Laser atherectomy with [place] 0.9 of right tp trunk and peroneal artery 6.  Drug-eluting stent placement right tp trunk and peroneal artery with 3 x 62mm Synergy  7.  Mynx device closure left common femoral artery  Review of Systems: Negative except as noted in the HPI. Denies N/V/F/Ch.  Past Medical History:  Diagnosis Date  . Acid reflux disease   . Cancer ([place])   . CLL (chronic lymphocytic leukemia) ([place]) 05/15/2016  . DDD (degenerative disc disease), lumbar   . Degenerative joint disease   . Diabetes mellitus without complication [facility])    Patient states this was a misdiagnosis from years ago  . Dyslipidemia   . History of diabetes mellitus 04/19/2017  . Hypertension   . 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 Panic attacks   . Pneumonia 02/2016  . 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 Stroke [facility]) age 56 yo   poorly controlled DM and Hypertension  . Volvulus of sigmoid colon (HCC)     Current Outpatient Medications:  .  bisacodyl (DULCOLAX) 5 MG EC tablet, Take 5 mg by mouth  daily as needed for moderate constipation., Disp: , Rfl:  .  cetirizine (ZYRTEC) 10 MG tablet, 1 tab by mouth once daily with evening meal for allergies (Patient taking differently: Take 10 mg by mouth daily as needed for allergies. ), Disp: 30 tablet, Rfl: 11 .  clopidogrel (PLAVIX) 75 MG tablet, Take 1 tablet (75 mg total) by mouth daily., Disp: 30 tablet, Rfl: 11 .  clopidogrel (PLAVIX) 75 MG tablet, Take 1 tablet (75 mg total) by mouth daily with breakfast., Disp: 30 tablet, Rfl: 11 .  dorzolamide-timolol (COSOPT) 22.3-6.8 MG/ML ophthalmic solution, Place 1 drop into both eyes 2 (two) times daily. , Disp: , Rfl:  .  furosemide (LASIX) 40 MG tablet, Take 1 tablet (40 mg total) by mouth daily., Disp: 30 tablet, Rfl: 11 .  latanoprost (XALATAN) 0.005 % ophthalmic solution, Place 1 drop into both eyes at bedtime. , Disp: , Rfl:  .  lisinopril (ZESTRIL) 10 MG tablet, Take 1 tablet (10 mg total) by mouth daily., Disp: 90 tablet, Rfl: 3 .  pantoprazole (PROTONIX) 40 MG tablet, 1 tab by mouth daily at 10 pm on empty stomach (Patient taking differently: Take 40 mg by mouth daily. ), Disp: 30 tablet, Rfl: 11 .  polyethylene glycol (MIRALAX / GLYCOLAX) 17 g packet, Take 17 g by mouth daily as needed. (Patient taking differently: Take 17 g by mouth daily as  needed for mild constipation. ), Disp: 14 each, Rfl: 0 .  potassium chloride (KLOR-CON) 10 MEQ tablet, TAKE 1 TABLET(10 MEQ) BY MOUTH DAILY (Patient taking differently: Take 10 mEq by mouth daily. ), Disp: 30 tablet, Rfl: 11 .  rosuvastatin (CRESTOR) 10 MG tablet, Take 1 tablet (10 mg total) by mouth daily., Disp: 30 tablet, Rfl: 11 .  senna-docusate (SENOKOT-S) 8.6-50 MG tablet, Take 1 tablet by mouth at bedtime as needed for mild constipation. (Patient not taking: Reported on 06/18/2020), Disp: 30 tablet, Rfl: 0  Social History   Tobacco Use  Smoking Status Former Smoker  . Types: Cigarettes  . Quit date: 02/18/1990  . Years since quitting: 30.3    Smokeless Tobacco Former Systems developer  . Types: Snuff  . Quit date: 02/18/1990    Allergies  Allergen Reactions  . Morphine And Related Other (See Comments)    Blood sugar dropped one-time   Objective:  There were no vitals filed for this visit. There is no height or weight on file to calculate BMI. Constitutional Well developed. Well nourished.  Vascular Dorsalis pedis pulses nonpalpable bilaterally. Posterior tibial pulsesnonm palpable bilaterally. Capillary refill normal to all digits.  No cyanosis or clubbing noted. Pedal hair growth normal.  Neurologic Normal speech. Oriented to person, place, and time. Protective sensation absent Nail Exam: Pt has thick disfigured discolored nails with subungual debris noted bilateral entire nail hallux through fifth toenails.  Pain on palpation to the nails.  Dermatologic  right second digit ulcer partial thickness.  Debridement was carried in standard technique.  No clinical signs of infection.  Does not probe down to bone.  Orthopedic: No pain to palpation either foot.   Radiographs: None Assessment:   1. Uncontrolled type 2 diabetes mellitus with hyperglycemia ([place])   2. Right second toe ulcer, limited to breakdown of skin ([place])   3. Peripheral arterial disease ([place])    Plan:  Patient was evaluated and treated and all questions answered.  Ulcer right second toe ulcer limited to the skin recurrence -Patient has a very superficial ulceration partial breakdown to the skin of the right second digit.  -Again given that this is very superficial nature I have asked the patient to utilize triple antibiotic and a Band-Aid to allow it to heal. -At this time I am not concerned for acute infection including cellulitis, malodor, purulent drainage. -However given that she has a history of ulceration to the side that was completely epithelialized, patient is a high risk of losing the digit of the foot.  Patient states understanding. -I have asked her to  go to the emergency room if there is worsening of the second digit or call the office right away.  Patient states understanding  Boneta Lucks, DPM    No follow-ups on file.  follow-up.  No follow-ups on file.

## 2020-07-05 ENCOUNTER — Other Ambulatory Visit: Payer: Self-pay

## 2020-07-05 DIAGNOSIS — I739 Peripheral vascular disease, unspecified: Secondary | ICD-10-CM

## 2020-07-05 DIAGNOSIS — I998 Other disorder of circulatory system: Secondary | ICD-10-CM

## 2020-07-15 ENCOUNTER — Ambulatory Visit: Payer: Medicare Other | Admitting: Podiatry

## 2020-07-19 ENCOUNTER — Ambulatory Visit (INDEPENDENT_AMBULATORY_CARE_PROVIDER_SITE_OTHER): Admit: 2020-07-19 | Discharge: 2020-07-19 | Disposition: A | Discharge status: Home / Self Care | Payer: Medicare Other

## 2020-07-19 ENCOUNTER — Ambulatory Visit (INDEPENDENT_AMBULATORY_CARE_PROVIDER_SITE_OTHER): Payer: Medicare Other | Admitting: Physician Assistant

## 2020-07-19 ENCOUNTER — Ambulatory Visit (HOSPITAL_COMMUNITY)
Admission: RE | Admit: 2020-07-19 | Discharge: 2020-07-19 | Disposition: A | Discharge status: Home / Self Care | Payer: Medicare Other | Source: Ambulatory Visit | Attending: Vascular Surgery | Admitting: Vascular Surgery

## 2020-07-19 ENCOUNTER — Other Ambulatory Visit: Payer: Self-pay

## 2020-07-19 VITALS — BP 122/69 | HR 62 | Temp 97.6°F | Resp 20 | Ht 62.5 in

## 2020-07-19 DIAGNOSIS — I739 Peripheral vascular disease, unspecified: Secondary | ICD-10-CM

## 2020-07-19 DIAGNOSIS — I998 Other disorder of circulatory system: Secondary | ICD-10-CM | POA: Diagnosis present

## 2020-07-19 NOTE — Progress Notes (Signed)
Office Note     CC:  follow up arteriogram Requesting Provider:  Mack Hook, MD  HPI: Erika Day is a 86 y.o. (Jul 23, 1930) female who presents for follow-up of aortogram with limited bilateral lower extremity angiography.  The patient presented with a second toe ulceration and underwent laser atherectomy of the right tibioperoneal trunk and peroneal artery.  A drug-eluting stent was placed in the right TP trunk and peroneal artery.  Procedure was performed on June 17, 2020 by Dr. Donzetta Matters.  She continues on Plavix, rosuvastatin.  History of diabetes type 2.  Former smoker quit in 1991. Ambulates via wheelchair.  Able to transfer by weightbearing on both legs.  Past Medical History:  Diagnosis Date  . Acid reflux disease   . Cancer ([place])   . CLL (chronic lymphocytic leukemia) ([place]) 05/15/2016  . DDD (degenerative disc disease), lumbar   . Degenerative joint disease   . Diabetes mellitus without complication [facility])    Patient states this was a misdiagnosis from years ago  . Dyslipidemia   . History of diabetes mellitus 04/19/2017  . Hypertension   . 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 Panic attacks   . Pneumonia 02/2016  . 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 Stroke [facility]) age 62 yo   poorly controlled DM and Hypertension  . Volvulus of sigmoid colon [facility])     Past Surgical History:  Procedure Laterality Date  . ABDOMINAL AORTOGRAM W/LOWER EXTREMITY N/A 06/17/2020   Procedure: ABDOMINAL AORTOGRAM W/LOWER EXTREMITY;  Surgeon: Waynetta Sandy, MD;  Location: [place] [facility];  Service: Cardiovascular;  Laterality: N/A;  . ABDOMINAL HYSTERECTOMY  ? 08/25/2011 ?   Has BSO for left benign ovarian tumor with torsion and broad ligament benign tumor, but does not appear had hysterectomy--[facility], NOT ovarian cancer  . CHOLECYSTECTOMY    . FEMUR IM NAIL Left 11/23/2016   Procedure: INTRAMEDULLARY (IM) NAIL FEMORAL;  Surgeon: Gaynelle Arabian, MD;  [facility];  Service: Orthopedics;  Laterality: Left;  . FLEXIBLE SIGMOIDOSCOPY N/A 05/19/2016   Procedure: FLEXIBLE SIGMOIDOSCOPY;  Surgeon: [name], MD;  [facility];  Service: Endoscopy;  Laterality: N/A;  . FLEXIBLE SIGMOIDOSCOPY N/A 06/28/2016   Procedure: FLEXIBLE SIGMOIDOSCOPY;  Surgeon: Ladene Artist, MD;  [facility];  Service: Endoscopy;  Laterality: N/A;  . LAPAROSCOPIC SIGMOID COLECTOMY N/A 06/30/2016   Procedure: LAPAROSCOPIC SIGMOID COLECTOMY,rigid sigmoidoscopy, repair recurrent incisional hernia;  Surgeon: Michael Boston, MD;  [facility];  Service: General;  Laterality: N/A;  . LAPAROTOMY N/A 04/29/2014   Procedure: EXPLORATORY LAPAROTOMY;  Surgeon: Imogene Burn. Georgette Dover, MD;  Location: [place];  Service: General;  Laterality: N/A;  . LAPAROTOMY N/A 04/16/2018   Procedure: EXPLORATORY LAPAROTOMY WITH LYSIS OF ADHESIONS;  Surgeon: Armandina Gemma, MD;  [facility];  Service: General;  Laterality: N/A;  . LYSIS OF ADHESION N/A 04/29/2014   Procedure: EXTENSIVE LYSIS OF ADHESIONS;  Surgeon: Imogene Burn. Georgette Dover, MD;  Location: [place];  Service: General;  Laterality: N/A;  . PERIPHERAL VASCULAR INTERVENTION  06/17/2020   Procedure: PERIPHERAL VASCULAR INTERVENTION;  Surgeon: Waynetta Sandy, MD;  Location: [place] [facility];  Service: Cardiovascular;;  right peroneal  . VENTRAL HERNIA REPAIR N/A 04/29/2014   Procedure: PRIMARY REPAIR OF VENTRAL HERNIA;  Surgeon: Imogene Burn. Georgette Dover, MD;  [facility];  Service: General;  Laterality: N/A;    Social History   Socioeconomic History  . Marital status:  Married    Spouse name: Erika Day  . Number of children: 0  . Years of education: Not on file  . Highest education level: Not on file  Occupational History  . Occupation: Retired housewife  Tobacco Use  . Smoking status: Former Smoker    Types: Cigarettes    Quit date: 02/18/1990    Years since quitting: 30.4  . Smokeless  tobacco: Former Systems developer    Types: Snuff    Quit date: 02/18/1990  Vaping Use  . Vaping Use: Never used  Substance and Sexual Activity  . Alcohol use: No    Alcohol/week: 0.0 standard drinks    Comment: Only drank alcohol between ages of 75 and 43 yo.  Her father gave it to her  . Drug use: No  . Sexual activity: Never  Other Topics Concern  . Not on file  Social History Narrative   Lives at home with her husband, [name], her "adopted daugher, Erika Day", and [name]'s little 84 yo boy.   Has been in a wheelchair because she kept falling--in wheelchair for about 10 years.   Sometimes gets up and holds onto furniture or wall to get around.     Did have a walker, but fell with that.  At one point had PT   Does not have any biologic children, but has cared for other family members' children through childhood   Erika Day is the granddaughter of her 43rd cousin.  She raised [name]'s mother, Erika Day as well.     74 yo son of Erika Day also lives in their home.     He is in daycare and his great grandmother watches him when she is at work.   Has brought up about 7 children over past 50 years.   Social Determinants of Health   Financial Resource Strain:   . Difficulty of Paying Living Expenses: Not on file  Food Insecurity:   . Worried About Charity fundraiser in the Last Year: Not on file  . Ran Out of Food in the Last Year: Not on file  Transportation Needs:   . Lack of Transportation (Medical): Not on file  . Lack of Transportation (Non-Medical): Not on file  Physical Activity:   . Days of Exercise per Week: Not on file  . Minutes of Exercise per Session: Not on file  Stress:   . Feeling of Stress : Not on file  Social Connections:   . Frequency of Communication with Friends and Family: Not on file  . Frequency of Social Gatherings with Friends and Family: Not on file  . Attends Religious Services: Not on file  . Active Member of Clubs or Organizations: Not on file  . Attends Theatre manager Meetings: Not on file  . Marital Status: Not on file  Intimate Partner Violence:   . Fear of Current or Ex-Partner: Not on file  . Emotionally Abused: Not on file  . Physically Abused: Not on file  . Sexually Abused: Not on file   Family History  Problem Relation Age of Onset  . Hypertension Mother   . Stroke Mother   . Arthritis Mother   . Alcohol abuse Father     Current Outpatient Medications  Medication Sig Dispense Refill  . bisacodyl (DULCOLAX) 5 MG EC tablet Take 5 mg by mouth daily as needed for moderate constipation.    . cetirizine (ZYRTEC) 10 MG tablet 1 tab by mouth once daily with evening meal for allergies (Patient taking differently: Take  10 mg by mouth daily as needed for allergies. ) 30 tablet 11  . clopidogrel (PLAVIX) 75 MG tablet Take 1 tablet (75 mg total) by mouth daily. 30 tablet 11  . clopidogrel (PLAVIX) 75 MG tablet Take 1 tablet (75 mg total) by mouth daily with breakfast. 30 tablet 11  . dorzolamide-timolol (COSOPT) 22.3-6.8 MG/ML ophthalmic solution Place 1 drop into both eyes 2 (two) times daily.     . furosemide (LASIX) 40 MG tablet Take 1 tablet (40 mg total) by mouth daily. 30 tablet 11  . latanoprost (XALATAN) 0.005 % ophthalmic solution Place 1 drop into both eyes at bedtime.     Marland Kitchen lisinopril (ZESTRIL) 10 MG tablet Take 1 tablet (10 mg total) by mouth daily. 90 tablet 3  . pantoprazole (PROTONIX) 40 MG tablet 1 tab by mouth daily at 10 pm on empty stomach (Patient taking differently: Take 40 mg by mouth daily. ) 30 tablet 11  . polyethylene glycol (MIRALAX / GLYCOLAX) 17 g packet Take 17 g by mouth daily as needed. (Patient taking differently: Take 17 g by mouth daily as needed for mild constipation. ) 14 each 0  . potassium chloride (KLOR-CON) 10 MEQ tablet TAKE 1 TABLET(10 MEQ) BY MOUTH DAILY (Patient taking differently: Take 10 mEq by mouth daily. ) 30 tablet 11  . rosuvastatin (CRESTOR) 10 MG tablet Take 1 tablet (10 mg total) by mouth  daily. 30 tablet 11  . senna-docusate (SENOKOT-S) 8.6-50 MG tablet Take 1 tablet by mouth at bedtime as needed for mild constipation. (Patient not taking: Reported on 06/18/2020) 30 tablet 0   No current facility-administered medications for this visit.    Allergies  Allergen Reactions  . Morphine And Related Other (See Comments)    Blood sugar dropped one-time     REVIEW OF SYSTEMS:   [X]  denotes positive finding, [ ]  denotes negative finding Cardiac  Comments:  Chest pain or chest pressure:    Shortness of breath upon exertion:    Short of breath when lying flat:    Irregular heart rhythm:        Vascular    Pain in calf, thigh, or hip brought on by ambulation:    Pain in feet at night that wakes you up from your sleep:     Blood clot in your veins:    Leg swelling:  x       Pulmonary    Oxygen at home:    Productive cough:     Wheezing:         Neurologic    Sudden weakness in arms or legs:     Sudden numbness in arms or legs:     Sudden onset of difficulty speaking or slurred speech:    Temporary loss of vision in one eye:     Problems with dizziness:         Gastrointestinal    Blood in stool:     Vomited blood:         Genitourinary    Burning when urinating:     Blood in urine:        Psychiatric    Major depression:         Hematologic    Bleeding problems:    Problems with blood clotting too easily:        Skin    Rashes or ulcers:        Constitutional    Fever or chills:      PHYSICAL  EXAMINATION:  Vitals:   07/19/20 1328  BP: 122/69  Pulse: 62  Resp: 20  Temp: 97.6 F (36.4 C)  SpO2: 100%   General:  WDWN in NAD; vital signs documented above Gait: Not observed HENT: WNL, normocephalic Pulmonary: normal non-labored breathing , without Rales, rhonchi,  wheezing Cardiac: regular HR,  Skin: without rashes :Extremities: without ischemic changes, without Gangrene , without cellulitis; with open wounds; both feet are warm and she has  intact motor and sensation.  Small ulcerated area of the base of the right great toe and right second toe.  She has a small fissure along the lateral aspect of her left great toe with some dried blood. Musculoskeletal: no muscle wasting or atrophy  Neurologic: A&O X 3;  No focal weakness or paresthesias are detected Psychiatric:  The pt has Normal affect.     Non-Invasive Vascular Imaging:   07/19/2020 ABI/TBIToday's ABIToday's TBIPrevious ABIPrevious TBI  +-------+-----------+-----------+------------+------------+  Right [place]     0.51    [place]     0.39      +-------+-----------+-----------+------------+------------+  Left  [place]     0.54    [place]     0.00      +-------+-----------+-----------+------------+------------+ Right great toe pressure 79 with biphasic and monophasic waveforms Left great toe pressure 83 with monophasic waveforms   ASSESSMENT/PLAN:: 86 y.o. female here for follow up for PAD, right toe ulcerations status post right TP trunk and peroneal atherectomy and stent placement.  Feet are warm and well-perfused.  Right second toe with dusky appearance small ulcerations are without drainage or foul odor.  Appear to be healing.  Vessels are noncompressible she has improvement in bilateral TBI's.  Toe pressures acceptable.  Continue local wound care.  Continue statin and Plavix.  Follow-up in 3 to 4 weeks to assess wounds.  Barbie Banner, PA-C [facility] [phone]  Clinic MD:   Donzetta Matters on-call

## 2020-07-25 NOTE — Progress Notes (Deleted)
Cardiology Office Note:   Date:  07/25/2020  NAME:  Erika Day    MRN: [id number] DOB:  June 23, 1930   PCP:  Mack Hook, MD  Cardiologist:  No primary care provider on file.  Electrophysiologist:  None   Referring MD: Mack Hook, MD   No chief complaint on file. ***  History of Present Illness:   Erika Day is a 86 y.o. female with a hx of HFpEF, PAD, moderate AS who presents for follow-up. She was admitted 06/17/2020 for critical limb ischemia and underwent R TP DES. Postoperatively developed hypotension and anemia. Had minimal troponin elevation without CP and no acute STT changes. Echo with normal WMA and moderate AS. Follow-up today.   Problem List 1. CKD III 2. MGUS 3. HFpEF 4. HTN 5. PAD -DES to R tibioperoneal artery 06/17/2020 6. Demand Ischemia -elevated troponin after critical limb ischemia  7. Moderate aortic stenosis -Vmax 2.9 m/s, MG 22 mmHG, AVA 1.18 cm2 8. HFpEF   Past Medical History: Past Medical History:  Diagnosis Date  . Acid reflux disease   . Cancer ([place])   . CLL (chronic lymphocytic leukemia) ([place]) 05/15/2016  . DDD (degenerative disc disease), lumbar   . Degenerative joint disease   . Diabetes mellitus without complication [facility])    Patient states this was a misdiagnosis from years ago  . Dyslipidemia   . History of diabetes mellitus 04/19/2017  . Hypertension   . 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 Panic attacks   . Pneumonia 02/2016  . 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 Stroke [facility]) age 79 yo   poorly controlled DM and Hypertension  . Volvulus of sigmoid colon [facility])     Past Surgical History: Past Surgical History:  Procedure Laterality Date  . ABDOMINAL AORTOGRAM W/LOWER EXTREMITY N/A 06/17/2020   Procedure: ABDOMINAL AORTOGRAM W/LOWER EXTREMITY;  Surgeon: Waynetta Sandy, MD;  Location: [place] [facility];  Service: Cardiovascular;  Laterality: N/A;  .  ABDOMINAL HYSTERECTOMY  ? 08/25/2011 ?   Has BSO for left benign ovarian tumor with torsion and broad ligament benign tumor, but does not appear had hysterectomy--[facility], NOT ovarian cancer  . CHOLECYSTECTOMY    . FEMUR IM NAIL Left 11/23/2016   Procedure: INTRAMEDULLARY (IM) NAIL FEMORAL;  Surgeon: Gaynelle Arabian, MD;  [facility]: [facility];  Service: Orthopedics;  Laterality: Left;  . FLEXIBLE SIGMOIDOSCOPY N/A 05/19/2016   Procedure: FLEXIBLE SIGMOIDOSCOPY;  Surgeon: [name], MD;  [facility];  Service: Endoscopy;  Laterality: N/A;  . FLEXIBLE SIGMOIDOSCOPY N/A 06/28/2016   Procedure: FLEXIBLE SIGMOIDOSCOPY;  Surgeon: Ladene Artist, MD;  [facility];  Service: Endoscopy;  Laterality: N/A;  . LAPAROSCOPIC SIGMOID COLECTOMY N/A 06/30/2016   Procedure: LAPAROSCOPIC SIGMOID COLECTOMY,rigid sigmoidoscopy, repair recurrent incisional hernia;  Surgeon: Michael Boston, MD;  [facility];  Service: General;  Laterality: N/A;  . LAPAROTOMY N/A 04/29/2014   Procedure: EXPLORATORY LAPAROTOMY;  Surgeon: Imogene Burn. Georgette Dover, MD;  Location: [place];  Service: General;  Laterality: N/A;  . LAPAROTOMY N/A 04/16/2018   Procedure: EXPLORATORY LAPAROTOMY WITH LYSIS OF ADHESIONS;  Surgeon: Armandina Gemma, MD;  [facility];  Service: General;  Laterality: N/A;  . LYSIS OF ADHESION N/A 04/29/2014   Procedure: EXTENSIVE LYSIS OF ADHESIONS;  Surgeon: Imogene Burn. Georgette Dover, MD;  Location: [place];  Service: General;  Laterality: N/A;  . PERIPHERAL VASCULAR INTERVENTION  06/17/2020   Procedure: PERIPHERAL VASCULAR INTERVENTION;  Surgeon: Donzetta Matters,  Georgia Dom, MD;  Location: [place] [facility];  Service: Cardiovascular;;  right peroneal  . VENTRAL HERNIA REPAIR N/A 04/29/2014   Procedure: PRIMARY REPAIR OF VENTRAL HERNIA;  Surgeon: Imogene Burn. Georgette Dover, MD;  Location: [place] [facility];  Service: General;  Laterality: N/A;    Current Medications: No outpatient medications have been marked as taking for the 07/26/20  encounter (Appointment) with O'Neal, Cassie Freer, MD.     Allergies:    Morphine and related   Social History: Social History   Socioeconomic History  . Marital status: Married    Spouse name: Erika Day  . Number of children: 0  . Years of education: Not on file  . Highest education level: Not on file  Occupational History  . Occupation: Retired housewife  Tobacco Use  . Smoking status: Former Smoker    Types: Cigarettes    Quit date: 02/18/1990    Years since quitting: 30.4  . Smokeless tobacco: Former Systems developer    Types: Snuff    Quit date: 02/18/1990  Vaping Use  . Vaping Use: Never used  Substance and Sexual Activity  . Alcohol use: No    Alcohol/week: 0.0 standard drinks    Comment: Only drank alcohol between ages of 40 and 40 yo.  Her father gave it to her  . Drug use: No  . Sexual activity: Never  Other Topics Concern  . Not on file  Social History Narrative   Lives at home with her husband, Erika Day, her "adopted daugher, Erika Day", and [name]'s little 84 yo boy.   Has been in a wheelchair because she kept falling--in wheelchair for about 10 years.   Sometimes gets up and holds onto furniture or wall to get around.     Did have a walker, but fell with that.  At one point had PT   Does not have any biologic children, but has cared for other family members' children through childhood   Erika Day is the granddaughter of her 75rd cousin.  She raised [name]'s mother, Erika Day as well.     86 yo son of Erika Day also lives in their home.     He is in daycare and his great grandmother watches him when she is at work.   Has brought up about 7 children over past 50 years.   Social Determinants of Health   Financial Resource Strain:   . Difficulty of Paying Living Expenses: Not on file  Food Insecurity:   . Worried About Charity fundraiser in the Last Year: Not on file  . Ran Out of Food in the Last Year: Not on file  Transportation Needs:   . Lack of Transportation  (Medical): Not on file  . Lack of Transportation (Non-Medical): Not on file  Physical Activity:   . Days of Exercise per Week: Not on file  . Minutes of Exercise per Session: Not on file  Stress:   . Feeling of Stress : Not on file  Social Connections:   . Frequency of Communication with Friends and Family: Not on file  . Frequency of Social Gatherings with Friends and Family: Not on file  . Attends Religious Services: Not on file  . Active Member of Clubs or Organizations: Not on file  . Attends Archivist Meetings: Not on file  . Marital Status: Not on file     Family History: The patient's ***family history includes Alcohol abuse in her father; Arthritis in her mother; Hypertension in her mother; Stroke in her  mother.  ROS:   All other ROS reviewed and negative. Pertinent positives noted in the HPI.     EKGs/Labs/Other Studies Reviewed:   The following studies were personally reviewed by me today:  EKG:  EKG is *** ordered today.  The ekg ordered today demonstrates ***, and was personally reviewed by me.   TTE 06/19/2020 1. Left ventricular ejection fraction, by estimation, is 60 to 65%. The  left ventricle has normal function. The left ventricle has no regional  wall motion abnormalities. There is mild left ventricular hypertrophy.  Left ventricular diastolic parameters  are consistent with age-related delayed relaxation (normal).  2. Right ventricular systolic function is normal. The right ventricular  size is normal. There is moderately elevated pulmonary artery systolic  pressure.  3. Left atrial size was moderately dilated.  4. The mitral valve is degenerative. Mild mitral valve regurgitation. No  evidence of mitral stenosis.  5. Tricuspid valve regurgitation is moderate.  6. The aortic valve is tricuspid. Aortic valve regurgitation is mild.  Moderate aortic valve stenosis.  7. The inferior vena cava is normal in size with greater than 50%    respiratory variability, suggesting right atrial pressure of 3 mmHg.   Recent Labs: 03/26/2020: B Natriuretic Peptide 202.7 03/27/2020: TSH 2.155 03/30/2020: ALT 12 03/31/2020: Magnesium 1.9 06/19/2020: BUN 30; Creatinine, Ser 1.59; Hemoglobin 8.8; Platelets 128; Potassium 4.1; Sodium 139   Recent Lipid Panel    Component Value Date/Time   CHOL 157 12/04/2019 1310   TRIG 74 12/04/2019 1310   HDL 47 12/04/2019 1310   CHOLHDL 5.7 03/08/2009 0500   VLDL 16 03/08/2009 0500   LDLCALC 96 12/04/2019 1310    Physical Exam:   VS:  There were no vitals taken for this visit.   Wt Readings from Last 3 Encounters:  06/17/20 150 lb (68 kg)  06/14/20 156 lb (70.8 kg)  03/26/20 156 lb (70.8 kg)    General: Well nourished, well developed, in no acute distress Heart: Atraumatic, normal size  Eyes: PEERLA, EOMI  Neck: Supple, no JVD Endocrine: No thryomegaly Cardiac: Normal S1, S2; RRR; no murmurs, rubs, or gallops Lungs: Clear to auscultation bilaterally, no wheezing, rhonchi or rales  Abd: Soft, nontender, no hepatomegaly  Ext: No edema, pulses 2+ Musculoskeletal: No deformities, BUE and BLE strength normal and equal Skin: Warm and dry, no rashes   Neuro: Alert and oriented to person, place, time, and situation, CNII-XII grossly intact, no focal deficits  Psych: Normal mood and affect   ASSESSMENT:   Erika Day is a 86 y.o. female who presents for the following: No diagnosis found.  PLAN:   There are no diagnoses linked to this encounter.  Disposition: No follow-ups on file.  Medication Adjustments/Labs and Tests Ordered: Current medicines are reviewed at length with the patient today.  Concerns regarding medicines are outlined above.  No orders of the defined types were placed in this encounter.  No orders of the defined types were placed in this encounter.   There are no Patient Instructions on file for this visit.   Time Spent with Patient: I have spent a total of ***  minutes with patient reviewing hospital notes, telemetry, EKGs, labs and examining the patient as well as establishing an assessment and plan that was discussed with the patient.  > 50% of time was spent in direct patient care.  Signed, Addison Naegeli. Audie Box, [place]  [address], [place], [place] [address] [phone]  07/25/2020 6:06 PM

## 2020-07-26 ENCOUNTER — Ambulatory Visit: Payer: Medicare Other | Admitting: Cardiovascular Disease

## 2020-07-29 ENCOUNTER — Other Ambulatory Visit: Payer: Self-pay | Admitting: Internal Medicine

## 2020-07-29 MED ORDER — PANTOPRAZOLE SODIUM 40 MG PO TBEC: DELAYED_RELEASE_TABLET | ORAL | 11 refills | Status: DC

## 2020-07-29 MED ORDER — POTASSIUM CHLORIDE ER 10 MEQ PO TBCR: EXTENDED_RELEASE_TABLET | ORAL | 11 refills | Status: DC

## 2020-07-29 MED ORDER — DORZOLAMIDE HCL-TIMOLOL MAL 2-0.5 % OP SOLN: 1.0000 [drp] | Freq: Two times a day (BID) | OPHTHALMIC | 11 refills | Status: DC

## 2020-07-29 MED ORDER — POLYETHYLENE GLYCOL 3350 17 G PO PACK: PACK | ORAL | 11 refills | Status: DC

## 2020-07-29 MED ORDER — FUROSEMIDE 40 MG PO TABS: ORAL_TABLET | ORAL | 11 refills | Status: DC

## 2020-07-29 MED ORDER — LISINOPRIL 10 MG PO TABS: ORAL_TABLET | ORAL | 11 refills | Status: DC

## 2020-07-29 MED ORDER — CLOPIDOGREL BISULFATE 75 MG PO TABS: 75.0000 mg | ORAL_TABLET | Freq: Every day | ORAL | 11 refills | Status: DC

## 2020-07-29 MED ORDER — ROSUVASTATIN CALCIUM 10 MG PO TABS: ORAL_TABLET | ORAL | 11 refills | Status: DC

## 2020-07-29 MED ORDER — LATANOPROST 0.005 % OP SOLN: 1.0000 [drp] | Freq: Every day | OPHTHALMIC | 11 refills | Status: DC

## 2020-07-29 MED ORDER — CETIRIZINE HCL 10 MG PO TABS: ORAL_TABLET | ORAL | 11 refills | Status: DC

## 2020-08-16 ENCOUNTER — Ambulatory Visit: Payer: Medicare Other

## 2020-08-21 ENCOUNTER — Ambulatory Visit (INDEPENDENT_AMBULATORY_CARE_PROVIDER_SITE_OTHER): Payer: Medicare Other | Admitting: Physician Assistant

## 2020-08-21 ENCOUNTER — Other Ambulatory Visit: Payer: Self-pay

## 2020-08-21 VITALS — HR 78 | Temp 97.9°F | Resp 20 | Ht 62.5 in | Wt 150.0 lb

## 2020-08-21 DIAGNOSIS — I739 Peripheral vascular disease, unspecified: Secondary | ICD-10-CM

## 2020-08-21 DIAGNOSIS — R600 Localized edema: Secondary | ICD-10-CM | POA: Diagnosis not present

## 2020-08-21 NOTE — Progress Notes (Signed)
POST OPERATIVE OFFICE NOTE    CC:  F/u for surgery  HPI:  This is a 86 y.o. female who is s/p aortogram with limited bilateral lower extremity angiography.  The patient presented with a second toe ulceration and underwent laser atherectomy of the right tibioperoneal trunk and peroneal artery.  A drug-eluting stent was placed in the right TP trunk and peroneal artery.  Procedure was performed on June 17, 2020 by Dr. Donzetta Matters.  She had evidence of ischemic changes and ulceration of her right second toe.  She presents today via wheelchair.  Home health is doing regular dressing changes.  She tells me her left lower leg is more swollen than usual and she is unable to elevated as much as she should.  She continues her Lasix as prescribed as well as daily Plavix.  She denies fever or chills.  She denies left lower extremity pain.  She does have some tenderness with dressing changes.  Allergies  Allergen Reactions  . Morphine And Related Other (See Comments)    Blood sugar dropped one-time    Current Outpatient Medications  Medication Sig Dispense Refill  . bisacodyl (DULCOLAX) 5 MG EC tablet Take 5 mg by mouth daily as needed for moderate constipation.    . cetirizine (ZYRTEC) 10 MG tablet 1 tab by mouth once daily with evening meal for allergies 30 tablet 11  . clopidogrel (PLAVIX) 75 MG tablet Take 1 tablet (75 mg total) by mouth daily with breakfast. 30 tablet 11  . dorzolamide-timolol (COSOPT) 22.3-6.8 MG/ML ophthalmic solution Place 1 drop into both eyes 2 (two) times daily. 10 mL 11  . furosemide (LASIX) 40 MG tablet 1 tab by mouth daily with morning meal 30 tablet 11  . latanoprost (XALATAN) 0.005 % ophthalmic solution Place 1 drop into both eyes at bedtime. 2.5 mL 11  . lisinopril (ZESTRIL) 10 MG tablet 1 tab by mouth daily with morning meal 30 tablet 11  . pantoprazole (PROTONIX) 40 MG tablet 1 tab by mouth 1/2 hour before breakfast on empty stomach. 30 tablet 11  . polyethylene glycol  (MIRALAX / GLYCOLAX) 17 g packet Mix 17 g with water and drink daily with evening meal 30 each 11  . potassium chloride (KLOR-CON) 10 MEQ tablet 1 tab by mouth daily with morning meal 30 tablet 11  . rosuvastatin (CRESTOR) 10 MG tablet 1 tab by mouth daily with evening meal 30 tablet 11  . senna-docusate (SENOKOT-S) 8.6-50 MG tablet Take 1 tablet by mouth at bedtime as needed for mild constipation. 30 tablet 0   No current facility-administered medications for this visit.     ROS:  See HPI  BP (!) 144/69 (BP Location: Right Arm, Patient Position: Sitting, Cuff Size: Normal)   Pulse 78   Temp 97.9 F (36.6 C) (Temporal)   Resp 20   Ht 5' 2.5" (1.588 m)   Wt 150 lb (68 kg)   SpO2 98%   BMI 27.00 kg/m   Physical Exam:  General appearance: Well-developed, well-nourished in no apparent distress Cardiac: Rhythm and rate are regular Respiratory: Nonlabored Extremities: She has brisk Doppler signals bilaterally of the dorsalis pedis, peroneal and posterior tibial arteries.  Her left lower leg has moderate edema from the knee level to the foot.  Examination of her right foot reveals superficial ulcerations of the right second toe and base of the third toe.  These are draining clear fluid.  I do not express any purulence. Neuro: Alert and oriented x4  Assessment/Plan:  This is a 86 y.o. female peripheral vascular disease status post atherectomy and stent placement of the tibioperoneal trunk and peroneal arteries of the right lower extremity.  She has had noncompressible ABIs in the past.  She has brisk Doppler signals today and the appearance of her right toes appears improved.  Continue Plavix and statin.  Recheck in 1 month.  Left lower extremity edema.  This appears acute in nature.  Calf and thigh are nontender.  Brisk Doppler pulses.  Will obtain duplex venous exam of the left lower extremity to rule out DVT.  Continue elevation.  I will call her with results of ultrasound  Risa Grill, PA-C [facility] [phone]  Clinic MD: Oneida Alar

## 2020-08-23 ENCOUNTER — Other Ambulatory Visit: Payer: Self-pay

## 2020-08-23 ENCOUNTER — Ambulatory Visit (HOSPITAL_COMMUNITY)
Admission: RE | Admit: 2020-08-23 | Discharge: 2020-08-23 | Disposition: A | Discharge status: Home / Self Care | Payer: Medicare Other | Source: Ambulatory Visit | Attending: Vascular Surgery | Admitting: Vascular Surgery

## 2020-08-23 DIAGNOSIS — R600 Localized edema: Secondary | ICD-10-CM | POA: Diagnosis not present

## 2020-08-26 ENCOUNTER — Encounter: Payer: Self-pay | Admitting: Physician Assistant

## 2020-08-26 NOTE — Progress Notes (Signed)
Spoke with patient on phone at home. Given results of LLE venous duplex.  She says LLE edema is considerably improved with elevation, but admits difficulty doing this when she is home alone. HH continues to monitor and dress right foot skin ulcers.  She denies LE pain.  I encouraged her to continue to elevate her leg and to notify us sooner if swelling worsens or she notices skin changes, blistering.  She verbalizes understanding.

## 2020-09-03 ENCOUNTER — Telehealth: Payer: Self-pay | Admitting: Internal Medicine

## 2020-09-04 ENCOUNTER — Ambulatory Visit (INDEPENDENT_AMBULATORY_CARE_PROVIDER_SITE_OTHER): Payer: Medicare Other | Admitting: Internal Medicine

## 2020-09-04 ENCOUNTER — Encounter: Payer: Self-pay | Admitting: Internal Medicine

## 2020-09-04 VITALS — BP 153/80 | HR 64 | Resp 12

## 2020-09-04 DIAGNOSIS — R531 Weakness: Secondary | ICD-10-CM | POA: Diagnosis not present

## 2020-09-04 DIAGNOSIS — R5383 Other fatigue: Secondary | ICD-10-CM | POA: Diagnosis not present

## 2020-09-04 NOTE — Progress Notes (Signed)
    Subjective:    Patient ID: Erika Day, female   DOB: 05/17/1930, 86 y.o.   MRN: [id number]   HPI   Two mornings ago, was not feeling well.  Just did not have much of an appetite.   She did eat ultimately and felt better.   She does feel on days when she does not want to eat that drinking an Ensure helps.   Her daughter called Monday and said the visiting nurse was measuring low blood pressures in past week prior.  We called [facility] and received her bp measurements, all of which were fine.   Today, she feels fine. Also states she often eats quite a lot and on those days feels better.   Did have anemia in August with hospitalization for ischemic foot. No melena or hematochezia.  States her toes are healing nicely.     Current Meds  Medication Sig  . cetirizine (ZYRTEC) 10 MG tablet 1 tab by mouth once daily with evening meal for allergies  . clopidogrel (PLAVIX) 75 MG tablet Take 1 tablet (75 mg total) by mouth daily with breakfast.  . dorzolamide-timolol (COSOPT) 22.3-6.8 MG/ML ophthalmic solution Place 1 drop into both eyes 2 (two) times daily.  . furosemide (LASIX) 40 MG tablet 1 tab by mouth daily with morning meal  . latanoprost (XALATAN) 0.005 % ophthalmic solution Place 1 drop into both eyes at bedtime.  Marland Kitchen lisinopril (ZESTRIL) 10 MG tablet 1 tab by mouth daily with morning meal  . pantoprazole (PROTONIX) 40 MG tablet 1 tab by mouth 1/2 hour before breakfast on empty stomach.  . polyethylene glycol (MIRALAX / GLYCOLAX) 17 g packet Mix 17 g with water and drink daily with evening meal  . potassium chloride (KLOR-CON) 10 MEQ tablet 1 tab by mouth daily with morning meal  . rosuvastatin (CRESTOR) 10 MG tablet 1 tab by mouth daily with evening meal   Allergies  Allergen Reactions  . Morphine And Related Other (See Comments)    Blood sugar dropped one-time     Review of Systems    Objective:   BP (!) 153/80 (BP Location: Right Arm, Patient Position: Sitting, Cuff  Size: Normal)   Pulse 64   Resp 12   Physical Exam  NAD in wheelchair (baseline) HEENT:  PERRL, EOMI Neck:  Supple, No adenopathy Chest:  CTA, Kyphotic CV:  RRR with systolic murmur.  Radial pulses normal and equal Abd:  S, NT, No HSM.  + BS LE:  Mild to moderated edema L>R of ankles   Assessment & Plan   1.  Fatigue:  Patient historically with inconsistent intake/sleep wake pattern, which we have tried to support improvement of for years with out much change.   Discussed healthy diet.  If unable to eat, okay to drink Ensure.  Also discussed regular meal times so sugar does not drop, which could be why she is not feeling well at times.  Attempted to draw blood for CBC, CMP, TSH to evaluate, but unable to obtain blood specimen. Patient and husband given a lab requisition and told to go to [address] [place] drawing station to obtain, which they have done in past with similar circumstances.  They both stated they understood and will go tomorrow.

## 2020-09-04 NOTE — Telephone Encounter (Signed)
error 

## 2020-09-13 ENCOUNTER — Other Ambulatory Visit: Payer: Self-pay | Admitting: Internal Medicine

## 2020-09-14 LAB — CBC WITH DIFFERENTIAL/PLATELET
Basophils Absolute: 0 10*3/uL (ref 0.0–0.2)
Basos: 1 %
EOS (ABSOLUTE): 0.1 10*3/uL (ref 0.0–0.4)
Eos: 2 %
Hematocrit: 29 % — ABNORMAL LOW (ref 34.0–46.6)
Hemoglobin: 9.5 g/dL — ABNORMAL LOW (ref 11.1–15.9)
Immature Grans (Abs): 0 10*3/uL (ref 0.0–0.1)
Immature Granulocytes: 0 %
Lymphocytes Absolute: 1.3 10*3/uL (ref 0.7–3.1)
Lymphs: 45 %
MCH: 28 pg (ref 26.6–33.0)
MCHC: 32.8 g/dL (ref 31.5–35.7)
MCV: 86 fL (ref 79–97)
Monocytes Absolute: 0.3 10*3/uL (ref 0.1–0.9)
Monocytes: 10 %
Neutrophils Absolute: 1.2 10*3/uL — ABNORMAL LOW (ref 1.4–7.0)
Neutrophils: 42 %
Platelets: 155 10*3/uL (ref 150–450)
RBC: 3.39 x10E6/uL — ABNORMAL LOW (ref 3.77–5.28)
RDW: 12.7 % (ref 11.7–15.4)
WBC: 2.8 10*3/uL — ABNORMAL LOW (ref 3.4–10.8)

## 2020-09-14 LAB — COMPREHENSIVE METABOLIC PANEL
ALT: 8 IU/L (ref 0–32)
AST: 16 IU/L (ref 0–40)
Albumin/Globulin Ratio: 1.8 (ref 1.2–2.2)
Albumin: 4.4 g/dL (ref 3.6–4.6)
Alkaline Phosphatase: 68 IU/L (ref 44–121)
BUN/Creatinine Ratio: 14 (ref 12–28)
BUN: 30 mg/dL — ABNORMAL HIGH (ref 8–27)
Bilirubin Total: 0.3 mg/dL (ref 0.0–1.2)
CO2: 21 mmol/L (ref 20–29)
Calcium: 9.5 mg/dL (ref 8.7–10.3)
Chloride: 95 mmol/L — ABNORMAL LOW (ref 96–106)
Creatinine, Ser: 2.21 mg/dL — ABNORMAL HIGH (ref 0.57–1.00)
GFR calc Af Amer: 22 mL/min/{1.73_m2} — ABNORMAL LOW (ref >59)
GFR calc non Af Amer: 19 mL/min/{1.73_m2} — ABNORMAL LOW (ref >59)
Globulin, Total: 2.5 g/dL (ref 1.5–4.5)
Glucose: 112 mg/dL — ABNORMAL HIGH (ref 65–99)
Potassium: 4.8 mmol/L (ref 3.5–5.2)
Sodium: 135 mmol/L (ref 134–144)
Total Protein: 6.9 g/dL (ref 6.0–8.5)

## 2020-09-14 LAB — TSH: TSH: 3.84 u[IU]/mL (ref 0.450–4.500)

## 2020-09-24 ENCOUNTER — Ambulatory Visit: Payer: Medicare Other

## 2020-09-27 ENCOUNTER — Telehealth: Payer: Self-pay | Admitting: Internal Medicine

## 2020-09-27 MED ORDER — DOXYCYCLINE HYCLATE 100 MG PO TABS: ORAL_TABLET | ORAL | 0 refills | Status: DC

## 2020-09-27 NOTE — Telephone Encounter (Signed)
Erika Day called with description of blisters draining watery substance--not pustular from her or Erika Day's description with surrounding erythema. Nobody is willing to call a daughter at this time to take her to urgent care or call an ambulance. Erika Day is not elevating legs again. Discussed with her she will not heal if she does not keep legs elevated. Reportedly, per Erika Day, [facility] home nurse was not out this week.  Reportedly comes on Mondays to assess her feet. Sending in Doxycyxline 100 mg twice daily and have insisted Erika Day keep her legs elevated, a long running problem. They are to call Erika Day if her feet worsen.  Day is reportedly to visit this weekend.

## 2020-09-28 ENCOUNTER — Other Ambulatory Visit: Payer: Self-pay | Admitting: Internal Medicine

## 2020-09-30 ENCOUNTER — Other Ambulatory Visit: Payer: Self-pay

## 2020-09-30 ENCOUNTER — Ambulatory Visit (INDEPENDENT_AMBULATORY_CARE_PROVIDER_SITE_OTHER): Payer: Medicare Other | Admitting: Physician Assistant

## 2020-09-30 ENCOUNTER — Encounter: Payer: Self-pay | Admitting: Physician Assistant

## 2020-09-30 VITALS — BP 158/81 | HR 97 | Temp 98.6°F | Resp 20 | Ht 62.5 in | Wt 139.0 lb

## 2020-09-30 DIAGNOSIS — I739 Peripheral vascular disease, unspecified: Secondary | ICD-10-CM | POA: Diagnosis not present

## 2020-09-30 DIAGNOSIS — R600 Localized edema: Secondary | ICD-10-CM

## 2020-09-30 NOTE — Telephone Encounter (Signed)
Spoke with Erika Day, Erika Day was seen by the vein and vascular doctor today Her blood flow is good in her feet. She does have a large amount of dead skin on feet. He sent note to [facility] agency to ask if better foot care could be given to patient. Erika Day has rx for antibiotic. Family misplaced rx but has found it now.

## 2020-09-30 NOTE — Telephone Encounter (Signed)
Can you make sure also that she started the Doxy on Friday when ordered?  Not sure why they are asking for a refill

## 2020-09-30 NOTE — Progress Notes (Signed)
Established Critical Limb Ischemia Patient   History of Present Illness   Erika Day is a 86 y.o. (1930-07-22) female who presents for a wound check.  She has a nonhealing second toe ulceration for which she underwent right TP trunk and peroneal artery atherectomy and stenting by Dr. Donzetta Matters on 06/17/2020.  She has been seen in clinic for periodic wound checks and ulceration appears to be healing well.  She was also evaluated for left lower extremity edema and had a venous duplex which was negative.  The daughter is with the patient today and is upset about the home health agency who has been caring for her mother.  She requests that I fax my office note from today's visit to help facilitate appropriate care.  Patient is mainly wheelchair-bound.  She is taking Plavix daily.  She denies tobacco use.  Current Outpatient Medications  Medication Sig Dispense Refill  . bisacodyl (DULCOLAX) 5 MG EC tablet Take 5 mg by mouth daily as needed for moderate constipation.     . cetirizine (ZYRTEC) 10 MG tablet 1 tab by mouth once daily with evening meal for allergies 30 tablet 11  . clopidogrel (PLAVIX) 75 MG tablet Take 1 tablet (75 mg total) by mouth daily with breakfast. 30 tablet 11  . dorzolamide-timolol (COSOPT) 22.3-6.8 MG/ML ophthalmic solution Place 1 drop into both eyes 2 (two) times daily. 10 mL 11  . doxycycline (VIBRA-TABS) 100 MG tablet 1 tab by mouth twice daily for 10 days. 20 tablet 0  . furosemide (LASIX) 40 MG tablet 1 tab by mouth daily with morning meal 30 tablet 11  . latanoprost (XALATAN) 0.005 % ophthalmic solution Place 1 drop into both eyes at bedtime. 2.5 mL 11  . lisinopril (ZESTRIL) 10 MG tablet 1 tab by mouth daily with morning meal 30 tablet 11  . pantoprazole (PROTONIX) 40 MG tablet 1 tab by mouth 1/2 hour before breakfast on empty stomach. 30 tablet 11  . polyethylene glycol (MIRALAX / GLYCOLAX) 17 g packet Mix 17 g with water and drink daily with evening meal 30 each 11    . potassium chloride (KLOR-CON) 10 MEQ tablet 1 tab by mouth daily with morning meal 30 tablet 11  . rosuvastatin (CRESTOR) 10 MG tablet 1 tab by mouth daily with evening meal 30 tablet 11  . senna-docusate (SENOKOT-S) 8.6-50 MG tablet Take 1 tablet by mouth at bedtime as needed for mild constipation. 30 tablet 0   No current facility-administered medications for this visit.    REVIEW OF SYSTEMS (negative unless checked):   Cardiac:  []  Chest pain or chest pressure? []  Shortness of breath upon activity? []  Shortness of breath when lying flat? []  Irregular heart rhythm?  Vascular:  []  Pain in calf, thigh, or hip brought on by walking? []  Pain in feet at night that wakes you up from your sleep? []  Blood clot in your veins? []  Leg swelling?  Pulmonary:  []  Oxygen at home? []  Productive cough? []  Wheezing?  Neurologic:  []  Sudden weakness in arms or legs? []  Sudden numbness in arms or legs? []  Sudden onset of difficult speaking or slurred speech? []  Temporary loss of vision in one eye? []  Problems with dizziness?  Gastrointestinal:  []  Blood in stool? []  Vomited blood?  Genitourinary:  []  Burning when urinating? []  Blood in urine?  Psychiatric:  []  Major depression  Hematologic:  []  Bleeding problems? []  Problems with blood clotting?  Dermatologic:  []  Rashes or ulcers?  Constitutional:  []   Fever or chills?  Ear/Nose/Throat:  []  Change in hearing? []  Nose bleeds? []  Sore throat?  Musculoskeletal:  []  Back pain? []  Joint pain? []  Muscle pain?   Physical Examination   Vitals:   09/30/20 1009  BP: (!) 158/81  Pulse: 97  Resp: 20  Temp: 98.6 F (37 C)  TempSrc: Temporal  SpO2: 100%  Weight: 139 lb (63 kg)  Height: 5' 2.5" (1.588 m)   Body mass index is 25.02 kg/m.  General:  WDWN in NAD; vital signs documented above Gait: Not observed HENT: WNL, normocephalic Pulmonary: normal non-labored breathing , without Rales, rhonchi,   wheezing Cardiac: regular HR Abdomen: soft, NT, no masses Skin: without rashes Vascular Exam/Pulses:  Right Left  DP absent absent  PT absent absent   Extremities: Right peroneal, DP, and PT signal by Doppler; brisk left peroneal, DP, and PT signal by Doppler; abundance of sloughed, cracked skin on foot and between toes; no ulcerations or open wounds currently Musculoskeletal: no muscle wasting or atrophy  Neurologic: A&O X 3;  No focal weakness or paresthesias are detected Psychiatric:  The pt has Normal affect.   Medical Decision Making   Erika Day is a 86 y.o. female who presents for wound check of R foot s/p TP trunk and peroneal atherectomy and stenting   Based on physical exam feet are well perfused with dopplerable DP, peroneal, and PT signals bilaterally  Ulcer of right second toe well-healed; no other ulcerations or wounds at this time however there is an abundance of sloughed dead skin  Proper hygiene is very important for this patient and thus I will have my office note from today faxed to the home health agency caring for this patient.  Recommendations would include daily soap and water cleansing of bilateral lower extremities; I would also recommend compression of left lower extremity to the level of the knee with an Ace wrap which should be rewrapped daily  Patient will follow up in 3 months with right lower extremity arterial duplex and ABIs; she will return sooner if wounds or rest pain develops   Dagoberto Ligas PA-C [facility] Office: [facility] MD: Trula Slade

## 2020-10-01 ENCOUNTER — Other Ambulatory Visit: Payer: Self-pay

## 2020-10-01 DIAGNOSIS — I739 Peripheral vascular disease, unspecified: Secondary | ICD-10-CM

## 2020-10-21 ENCOUNTER — Telehealth: Payer: Self-pay | Admitting: Internal Medicine

## 2020-10-21 NOTE — Telephone Encounter (Signed)
Patient's daughter called requesting an advise. Patient's care taker, Ms [name], tested positive for COVID on Thursday, Dec. 16 and notified the patient on Friday, Dec. 17. Patient last contact with care taker was on Tuesday, December 14. Patient was to know if they need to self quarantine and if so until when or if she and her husband need to get tested. Neither, patient or husband are showing any symptoms.  Please advise.

## 2020-10-21 NOTE — Telephone Encounter (Signed)
Discussed with Dr. Amil Amen- pt needs to quarantine and get tested 5 days after exposure. In this case patient can get tested now if her last exposure was on Tuesday. Same applies to other family members exposed - if test is Negative - they are able to resume normal activities  Spoke to [place] and states she will get patients tested at [facility] on [address] and tell them to continue quarantine. [name] will also relate information to other family members exposed (aunt and grandkids).

## 2020-10-29 ENCOUNTER — Telehealth: Payer: Self-pay | Admitting: Clinical

## 2020-10-29 NOTE — Telephone Encounter (Signed)
Daughter contacted clinic seeking COVID testing for parents as they are requiring it for nurse aid to return to the home. Daughter reports no symptoms were presented since the exposure. LCSW informed currently clinic not offering covid testing however encouraged to contact [facility] hotline to get scheduled at [facility], and informed of website where to register to with [facility]. LCSW informed if continued trouble to contact social worker in order to email the link for easier access. Social worker documented phone call for records.

## 2020-11-08 ENCOUNTER — Telehealth: Payer: Self-pay | Admitting: Internal Medicine

## 2020-11-08 NOTE — Telephone Encounter (Signed)
Patient's temporary aid called asking for advise on what can she give to the patient for bowel movement since her insurance refuse to cover polyethylene glycol (MIRALAX / GLYCOLAX) 17 g packet. Please advise.

## 2020-11-11 ENCOUNTER — Other Ambulatory Visit: Payer: Self-pay | Admitting: Internal Medicine

## 2020-11-12 LAB — BASIC METABOLIC PANEL
BUN/Creatinine Ratio: 15 (ref 12–28)
BUN: 25 mg/dL (ref 8–27)
CO2: 24 mmol/L (ref 20–29)
Calcium: 10.2 mg/dL (ref 8.7–10.3)
Chloride: 96 mmol/L (ref 96–106)
Creatinine, Ser: 1.7 mg/dL — ABNORMAL HIGH (ref 0.57–1.00)
GFR calc Af Amer: 31 mL/min/{1.73_m2} — ABNORMAL LOW (ref >59)
GFR calc non Af Amer: 27 mL/min/{1.73_m2} — ABNORMAL LOW (ref >59)
Glucose: 89 mg/dL (ref 65–99)
Potassium: 4.9 mmol/L (ref 3.5–5.2)
Sodium: 133 mmol/L — ABNORMAL LOW (ref 134–144)

## 2020-11-12 LAB — IRON AND TIBC
Iron Saturation: 25 % (ref 15–55)
Iron: 61 ug/dL (ref 27–139)
Total Iron Binding Capacity: 243 ug/dL — ABNORMAL LOW (ref 250–450)
UIBC: 182 ug/dL (ref 118–369)

## 2020-11-12 LAB — B12 AND FOLATE PANEL
Folate: 13.5 ng/mL (ref >3.0)
Vitamin B-12: 341 pg/mL (ref 232–1245)

## 2020-12-11 ENCOUNTER — Other Ambulatory Visit: Payer: Self-pay

## 2020-12-11 ENCOUNTER — Ambulatory Visit (INDEPENDENT_AMBULATORY_CARE_PROVIDER_SITE_OTHER): Payer: Medicare Other | Admitting: Internal Medicine

## 2020-12-11 ENCOUNTER — Encounter: Payer: Self-pay | Admitting: Internal Medicine

## 2020-12-11 VITALS — BP 130/70 | HR 74 | Resp 18

## 2020-12-11 DIAGNOSIS — L853 Xerosis cutis: Secondary | ICD-10-CM

## 2020-12-11 DIAGNOSIS — L98491 Non-pressure chronic ulcer of skin of other sites limited to breakdown of skin: Secondary | ICD-10-CM

## 2020-12-11 DIAGNOSIS — Z23 Encounter for immunization: Secondary | ICD-10-CM

## 2020-12-11 DIAGNOSIS — R600 Localized edema: Secondary | ICD-10-CM | POA: Diagnosis not present

## 2020-12-11 DIAGNOSIS — I10 Essential (primary) hypertension: Secondary | ICD-10-CM

## 2020-12-11 NOTE — Progress Notes (Signed)
Subjective:    Patient ID: Erika Day, female   DOB: [birth year], 87 y.o.   MRN: [id number]   HPI   1. Left foot:  Skin still peeling where had a wound before.  No drainage.  Home nursing signed off in recent weeks as was felt to be healed.   No pain in toes or foot.  She does not have claudication symptoms.  No ambulating, however. Feeling pretty well  [name], her home aide died of COVID a few weeks ago.  They now have another woman, [name], who is providing help at home.  2.  Knot in her right neck.  Gets larger at times and then hard to breathe.  Goes down and then is fine.  States just uncomfortable.  A problem for maybe 8 months on and off.  Has not noted it swelling with cold symptoms.  But later states if uses Nyquil, phlegm comes up and then no problem breathing.  3.  LE Edema:  Not elevating legs--both patient and husband admit to this.  Admits now to sleeping in wheelchair with legs dependent and bent forward with her forearms and head resting on knees most of the time.  Denies pain with her legs currently  Current Meds  Medication Sig  . bisacodyl (DULCOLAX) 5 MG EC tablet Take 5 mg by mouth daily as needed for moderate constipation.   . cetirizine (ZYRTEC) 10 MG tablet 1 tab by mouth once daily with evening meal for allergies  . clopidogrel (PLAVIX) 75 MG tablet Take 1 tablet (75 mg total) by mouth daily with breakfast.  . dorzolamide-timolol (COSOPT) 22.3-6.8 MG/ML ophthalmic solution Place 1 drop into both eyes 2 (two) times daily.  . furosemide (LASIX) 40 MG tablet 1 tab by mouth daily with morning meal  . latanoprost (XALATAN) 0.005 % ophthalmic solution Place 1 drop into both eyes at bedtime.  Marland Kitchen lisinopril (ZESTRIL) 10 MG tablet 1 tab by mouth daily with morning meal  . pantoprazole (PROTONIX) 40 MG tablet 1 tab by mouth 1/2 hour before breakfast on empty stomach.  . polyethylene glycol (MIRALAX / GLYCOLAX) 17 g packet Mix 17 g with water and drink daily  with evening meal  . potassium chloride (KLOR-CON) 10 MEQ tablet 1 tab by mouth daily with morning meal  . rosuvastatin (CRESTOR) 10 MG tablet 1 tab by mouth daily with evening meal   Allergies  Allergen Reactions  . Morphine And Related Other (See Comments)    Blood sugar dropped one-time     Review of Systems    Objective:   BP 130/70 (BP Location: Left Arm, Patient Position: Sitting, Cuff Size: Normal)   Pulse 74   Resp 18   Physical Exam  NAD HEENT:  TMs pearly gray, throat without injection.   Neck:  Supple, 1 cm soft node, right anterior cervical area.  Moveable.  Lungs:  CTA CV:  RRR with Grade III/VI SEM upper LSB.  Radial pulses normal and equal. Abd:  Kyphotic and bent forward--unable to get onto exam bed.  + BS, No obvious HSM or mass.   LE:  Bilateral woody edema with dime sized superficial opening at posterior calf with clear weeping.  Thick large dry scaling of feet and lower legs.  Assessment & Plan  1.  Bilateral LE edema with early skin breakdown.  Referral again to [facility] care for skilled nursing and med management.  Attn:  Crystal Have not been able to identify where to  send a request to [employer] for replacement of hospital bed with something like a cardiac chair to see if would be more comfortable and more likely to recline and elevate legs.    2.  Home Health Aid:  Called [facility] to see if can get [name] on board for [place] as well.  They are sending paperwork for husband to get care as well--needs assistance with cooking, washing clothes, cleaning home.  3.  HM:  Pneumococcal 23 v today, left shoulder.  Both patient and husband to return on Monday for Moderna booster.  4.  Hypertension:  Controlled.

## 2020-12-13 ENCOUNTER — Telehealth: Payer: Self-pay | Admitting: Internal Medicine

## 2020-12-13 NOTE — Telephone Encounter (Signed)
Patient's new caretaker, [name], called to check if that patient needs to be on Biscodyl 5mg  and Senokot. Ms. Kenton Kingfisher stated that those Rx are listed on the updated prescription list but patient does not have does at home.   Also, Ms. Kenton Kingfisher is asking if you can give her instructions on how to dress her leg appropriately for the big ball patient has on the back of her leg. Please advise.

## 2020-12-13 NOTE — Telephone Encounter (Signed)
Patient's nurse called asking if the Therahonny sheet dressing for Erika Day is something you need to prescribed or she just need to request that to the pharmacy, because does not have any more.

## 2020-12-14 ENCOUNTER — Emergency Department (HOSPITAL_COMMUNITY): Payer: Medicare Other

## 2020-12-14 ENCOUNTER — Other Ambulatory Visit: Payer: Self-pay

## 2020-12-14 ENCOUNTER — Encounter (HOSPITAL_COMMUNITY): Payer: Self-pay | Admitting: Emergency Medicine

## 2020-12-14 ENCOUNTER — Emergency Department (HOSPITAL_COMMUNITY)
Admission: EM | Admit: 2020-12-14 | Discharge: 2020-12-14 | Disposition: A | Discharge status: Home / Self Care | Payer: Medicare Other | Attending: Emergency Medicine | Admitting: Emergency Medicine

## 2020-12-14 DIAGNOSIS — Z87891 Personal history of nicotine dependence: Secondary | ICD-10-CM | POA: Diagnosis not present

## 2020-12-14 DIAGNOSIS — R531 Weakness: Secondary | ICD-10-CM | POA: Diagnosis not present

## 2020-12-14 DIAGNOSIS — I509 Heart failure, unspecified: Secondary | ICD-10-CM | POA: Diagnosis not present

## 2020-12-14 DIAGNOSIS — R0602 Shortness of breath: Secondary | ICD-10-CM | POA: Insufficient documentation

## 2020-12-14 DIAGNOSIS — E1122 Type 2 diabetes mellitus with diabetic chronic kidney disease: Secondary | ICD-10-CM | POA: Insufficient documentation

## 2020-12-14 DIAGNOSIS — Z79899 Other long term (current) drug therapy: Secondary | ICD-10-CM | POA: Diagnosis not present

## 2020-12-14 DIAGNOSIS — R059 Cough, unspecified: Secondary | ICD-10-CM | POA: Diagnosis not present

## 2020-12-14 DIAGNOSIS — I130 Hypertensive heart and chronic kidney disease with heart failure and stage 1 through stage 4 chronic kidney disease, or unspecified chronic kidney disease: Secondary | ICD-10-CM | POA: Diagnosis not present

## 2020-12-14 DIAGNOSIS — Z7902 Long term (current) use of antithrombotics/antiplatelets: Secondary | ICD-10-CM | POA: Insufficient documentation

## 2020-12-14 DIAGNOSIS — Z859 Personal history of malignant neoplasm, unspecified: Secondary | ICD-10-CM | POA: Diagnosis not present

## 2020-12-14 DIAGNOSIS — N1832 Chronic kidney disease, stage 3b: Secondary | ICD-10-CM | POA: Insufficient documentation

## 2020-12-14 DIAGNOSIS — Z20822 Contact with and (suspected) exposure to covid-19: Secondary | ICD-10-CM | POA: Insufficient documentation

## 2020-12-14 HISTORY — DX: Heart failure, unspecified: I50.9

## 2020-12-14 LAB — BASIC METABOLIC PANEL
Anion gap: 9 (ref 5–15)
BUN: 20 mg/dL (ref 8–23)
CO2: 25 mmol/L (ref 22–32)
Calcium: 9.7 mg/dL (ref 8.9–10.3)
Chloride: 102 mmol/L (ref 98–111)
Creatinine, Ser: 1.75 mg/dL — ABNORMAL HIGH (ref 0.44–1.00)
GFR, Estimated: 28 mL/min — ABNORMAL LOW (ref >60)
Glucose, Bld: 110 mg/dL — ABNORMAL HIGH (ref 70–99)
Potassium: 4.6 mmol/L (ref 3.5–5.1)
Sodium: 136 mmol/L (ref 135–145)

## 2020-12-14 LAB — CBC
HCT: 30.2 % — ABNORMAL LOW (ref 36.0–46.0)
Hemoglobin: 9.4 g/dL — ABNORMAL LOW (ref 12.0–15.0)
MCH: 28.8 pg (ref 26.0–34.0)
MCHC: 31.1 g/dL (ref 30.0–36.0)
MCV: 92.6 fL (ref 80.0–100.0)
Platelets: 134 10*3/uL — ABNORMAL LOW (ref 150–400)
RBC: 3.26 MIL/uL — ABNORMAL LOW (ref 3.87–5.11)
RDW: 12.4 % (ref 11.5–15.5)
WBC: 2.8 10*3/uL — ABNORMAL LOW (ref 4.0–10.5)
nRBC: 0 % (ref 0.0–0.2)

## 2020-12-14 LAB — URINALYSIS, ROUTINE W REFLEX MICROSCOPIC
Bacteria, UA: NONE SEEN
Bilirubin Urine: NEGATIVE
Glucose, UA: NEGATIVE mg/dL
Ketones, ur: NEGATIVE mg/dL
Leukocytes,Ua: NEGATIVE
Nitrite: NEGATIVE
Protein, ur: NEGATIVE mg/dL
Specific Gravity, Urine: 1.01 (ref 1.005–1.030)
pH: 8 (ref 5.0–8.0)

## 2020-12-14 LAB — RESP PANEL BY RT-PCR (FLU A&B, COVID) ARPGX2
Influenza A by PCR: NEGATIVE
Influenza B by PCR: NEGATIVE
SARS Coronavirus 2 by RT PCR: NEGATIVE

## 2020-12-14 LAB — BRAIN NATRIURETIC PEPTIDE: B Natriuretic Peptide: 571 pg/mL — ABNORMAL HIGH (ref 0.0–100.0)

## 2020-12-14 MED ORDER — LISINOPRIL 10 MG PO TABS
10.0000 mg | ORAL_TABLET | Freq: Once | ORAL | Status: AC
  Administered 2020-12-14: 10 mg via ORAL
  Filled 2020-12-14: qty 1

## 2020-12-14 MED ORDER — SODIUM CHLORIDE 0.9% FLUSH
3.0000 mL | Freq: Once | INTRAVENOUS | Status: AC
  Administered 2020-12-14: 3 mL via INTRAVENOUS

## 2020-12-14 NOTE — ED Notes (Signed)
Patient verbalized understanding of discharge instructions. Opportunity for questions and answers.  

## 2020-12-14 NOTE — ED Triage Notes (Signed)
Pt to triage via [facility] from home.  Reports generalized weakness and SOB today.  20g LFA.  Pt wheelchair bound.  Lung sounds clear.  Hx of CHF.

## 2020-12-14 NOTE — ED Provider Notes (Signed)
[facility] Provider Note   CSN: [id number] Arrival date & time: 12/14/20  1412     History Chief Complaint  Patient presents with  . Weakness    Erika Day is a 87 y.o. female with h/o HTN, HLD, CHF, and T2DM who presents to the ED from home via EMS with daughter for generalized weakness and intermittent SOB. Patient states she feels weaker than usual over the last several days. Associated intermittent SOB with occasional dry non-productive cough. Denies focal weakness or numbness. Denies headache, vision changes, chest pain, N/V/D, abdominal pain, or urinary symptoms.No recent sick contacts or travel. Patient lives at home and is wheelchair bound at baseline. CNA visits daily.  The history is provided by the patient, a relative and medical records.  Weakness Severity:  Moderate Onset quality:  Gradual Duration:  3 days Timing:  Constant Progression:  Worsening Chronicity:  New Context comment:  Spontaneous Relieved by:  Nothing Worsened by:  Nothing Ineffective treatments:  None tried Associated symptoms: cough and shortness of breath   Associated symptoms: no abdominal pain, no arthralgias, no chest pain, no diarrhea, no dysuria, no falls, no fever, no headaches, no hematochezia, no melena, no nausea, no seizures and no vomiting   Risk factors: congestive heart failure        Past Medical History:  Diagnosis Date  . Acid reflux disease   . Cancer ([place])   . CHF (congestive heart failure) ([place])   . CLL (chronic lymphocytic leukemia) ([place]) 05/15/2016  . DDD (degenerative disc disease), lumbar   . Degenerative joint disease   . Diabetes mellitus without complication [facility])    Patient states this was a misdiagnosis from years ago  . Dyslipidemia   . History of diabetes mellitus 04/19/2017  . Hypertension   . 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 Panic attacks   . Pneumonia 02/2016  . 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 Stroke [facility]) age 29 yo   poorly controlled DM and Hypertension  . Volvulus of sigmoid colon [facility])     Patient Active Problem List   Diagnosis Date Noted  . PAD (peripheral artery disease) ([place]) 06/17/2020  . Gastroesophageal reflux disease without esophagitis 04/10/2020  . Elevated d-dimer 03/27/2020  . Chronic kidney disease, stage 3b ([place]) 03/27/2020  . Uncontrolled type 2 diabetes mellitus with hyperglycemia ([place]) 03/27/2020  . Pressure injury of skin 03/27/2020  . Goals of care, counseling/discussion 01/23/2020  . Recurrent falls 09/05/2019  . CHF (congestive heart failure) ([place]) 06/28/2019  . MGUS (monoclonal gammopathy of unknown significance) 02/11/2018  . History of diabetes mellitus 04/19/2017  . Hypercalcemia 02/12/2017  . Chronic leukopenia 02/12/2017  . Postoperative anemia due to acute blood loss 11/26/2016  . Fall 11/23/2016  . Closed fracture of intertrochanteric section of femur ([place]) 11/23/2016  . Pancytopenia ([place]) 11/13/2016  . Sinusitis 10/14/2016  . Bleeding external hemorrhoids 08/20/2016  . Recurrent ventral incisional hernia s/p primary repair 06/30/2016 07/01/2016  . Bilateral leg edema 05/20/2016  . Protein-calorie malnutrition, severe ([place]) 05/20/2016  . Sigmoid volvulus s/p lap sigmoid colectomy 06/30/2016 05/19/2016  . 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 Constipation 05/15/2016  . Other fatigue 05/15/2016  . Anemia 02/13/2016  . Thrombocytopenia ([place]) 02/13/2016  . Kyphosis 02/01/2016  . Pressure ulcer of coccygeal region 01/31/2016  . Mobility impaired 01/31/2016  .  Osteoarthritis 01/31/2016  . Abdominal pain 04/23/2014  . Essential hypertension 09/06/2011  . Ovarian torsion 08/25/2011    Past Surgical History:  Procedure Laterality Date  . ABDOMINAL AORTOGRAM W/LOWER EXTREMITY N/A 06/17/2020   Procedure: ABDOMINAL AORTOGRAM  W/LOWER EXTREMITY;  Surgeon: Waynetta Sandy, MD;  Location: [facility];  Service: Cardiovascular;  Laterality: N/A;  . ABDOMINAL HYSTERECTOMY  ? 08/25/2011 ?   Has BSO for left benign ovarian tumor with torsion and broad ligament benign tumor, but does not appear had hysterectomy--[facility], NOT ovarian cancer  . CHOLECYSTECTOMY    . FEMUR IM NAIL Left 11/23/2016   Procedure: INTRAMEDULLARY (IM) NAIL FEMORAL;  Surgeon: Gaynelle Arabian, MD;  Location: [facility];  Service: Orthopedics;  Laterality: Left;  . FLEXIBLE SIGMOIDOSCOPY N/A 05/19/2016   Procedure: FLEXIBLE SIGMOIDOSCOPY;  Surgeon: [name], MD;  Location[facility];  Service: Endoscopy;  Laterality: N/A;  . FLEXIBLE SIGMOIDOSCOPY N/A 06/28/2016   Procedure: FLEXIBLE SIGMOIDOSCOPY;  Surgeon: Ladene Artist, MD;  Location: [facility];  Service: Endoscopy;  Laterality: N/A;  . LAPAROSCOPIC SIGMOID COLECTOMY N/A 06/30/2016   Procedure: LAPAROSCOPIC SIGMOID COLECTOMY,rigid sigmoidoscopy, repair recurrent incisional hernia;  Surgeon: Michael [place], MD;  Location: [facility];  Service: General;  Laterality: N/A;  . LAPAROTOMY N/A 04/29/2014   Procedure: EXPLORATORY LAPAROTOMY;  Surgeon: Imogene Burn. Georgette Dover, MD;  Location: [facility];  Service: General;  Laterality: N/A;  . LAPAROTOMY N/A 04/16/2018   Procedure: EXPLORATORY LAPAROTOMY WITH LYSIS OF ADHESIONS;  Surgeon: Armandina Gemma, MD;  Location: [facility];  Service: General;  Laterality: N/A;  . LYSIS OF ADHESION N/A 04/29/2014   Procedure: EXTENSIVE LYSIS OF ADHESIONS;  Surgeon: Imogene Burn. Georgette Dover, MD;  Location: [facility];  Service: General;  Laterality: N/A;  . PERIPHERAL VASCULAR INTERVENTION  06/17/2020   Procedure: PERIPHERAL VASCULAR INTERVENTION;  Surgeon: Waynetta Sandy, MD;  Location: [place] [facility];  Service: Cardiovascular;;  right peroneal  . VENTRAL HERNIA REPAIR N/A 04/29/2014   Procedure: PRIMARY REPAIR OF VENTRAL HERNIA;  Surgeon: Imogene Burn. Georgette Dover, MD;  Location: [facility];   Service: General;  Laterality: N/A;     OB History   No obstetric history on file.     Family History  Problem Relation Age of Onset  . Hypertension Mother   . Stroke Mother   . Arthritis Mother   . Alcohol abuse Father     Social History   Tobacco Use  . Smoking status: Former Smoker    Types: Cigarettes    Quit date: 02/18/1990    Years since quitting: 30.8  . Smokeless tobacco: Former Systems developer    Types: Snuff    Quit date: 02/18/1990  Vaping Use  . Vaping Use: Never used  Substance Use Topics  . Alcohol use: No    Alcohol/week: 0.0 standard drinks    Comment: Only drank alcohol between ages of 56 and 38 yo.  Her father gave it to her  . Drug use: No    Home Medications Prior to Admission medications   Medication Sig Start Date End Date Taking? Authorizing Provider  bisacodyl (DULCOLAX) 5 MG EC tablet Take 5 mg by mouth daily as needed for moderate constipation.     [provider]  cetirizine (ZYRTEC) 10 MG tablet 1 tab by mouth once daily with evening meal for allergies 07/29/20   Mack Hook, MD  clopidogrel (PLAVIX) 75 MG tablet Take 1 tablet (75 mg total) by mouth daily with breakfast. 07/29/20   Mack Hook,  MD  dorzolamide-timolol (COSOPT) 22.3-6.8 MG/ML ophthalmic solution Place 1 drop into both eyes 2 (two) times daily. 07/29/20   Mack Hook, MD  furosemide (LASIX) 40 MG tablet 1 tab by mouth daily with morning meal 07/29/20   Mack Hook, MD  latanoprost (XALATAN) 0.005 % ophthalmic solution Place 1 drop into both eyes at bedtime. 07/29/20   Mack Hook, MD  lisinopril (ZESTRIL) 10 MG tablet 1 tab by mouth daily with morning meal 07/29/20   Mack Hook, MD  pantoprazole (PROTONIX) 40 MG tablet 1 tab by mouth 1/2 hour before breakfast on empty stomach. 07/29/20   Mack Hook, MD  polyethylene glycol (MIRALAX / GLYCOLAX) 17 g packet Mix 17 g with water and drink daily with evening meal 07/29/20   Mack Hook, MD  potassium chloride (KLOR-CON) 10 MEQ tablet 1 tab by mouth daily with morning meal 07/29/20   Mack Hook, MD  rosuvastatin (CRESTOR) 10 MG tablet 1 tab by mouth daily with evening meal 07/29/20   Mack Hook, MD  senna-docusate (SENOKOT-S) 8.6-50 MG tablet Take 1 tablet by mouth at bedtime as needed for mild constipation. 03/31/20   Damita Lack, MD    Allergies    Morphine and related  Review of Systems   Review of Systems  Constitutional: Negative for chills and fever.  HENT: Negative for ear pain and sore throat.   Eyes: Negative for pain and visual disturbance.  Respiratory: Positive for cough and shortness of breath.   Cardiovascular: Negative for chest pain and palpitations.  Gastrointestinal: Negative for abdominal pain, diarrhea, hematochezia, melena, nausea and vomiting.  Genitourinary: Negative for dysuria and hematuria.  Musculoskeletal: Negative for arthralgias, back pain and falls.  Skin: Negative for color change and rash.  Neurological: Positive for weakness. Negative for seizures, syncope and headaches.  All other systems reviewed and are negative.   Physical Exam Updated Vital Signs BP (!) 177/90   Pulse 74   Temp 98.1 F (36.7 C)   Resp 20   Ht 5\' 5"  (1.651 m)   Wt 72.6 kg   SpO2 98%   BMI 26.63 kg/m   Physical Exam Vitals and nursing note reviewed.  Constitutional:      General: She is not in acute distress.    Appearance: Normal appearance. She is well-developed and well-nourished. She is not ill-appearing or diaphoretic.  HENT:     Head: Normocephalic and atraumatic.     Right Ear: External ear normal.     Left Ear: External ear normal.     Nose: Nose normal. No congestion or rhinorrhea.  Eyes:     General: No scleral icterus.       Right eye: No discharge.        Left eye: No discharge.     Conjunctiva/sclera: Conjunctivae normal.  Cardiovascular:     Rate and Rhythm: Normal rate and regular rhythm.      Pulses: Normal pulses.     Heart sounds: Normal heart sounds. No murmur heard.   Pulmonary:     Effort: Pulmonary effort is normal. No respiratory distress.     Breath sounds: Normal breath sounds. No wheezing, rhonchi or rales.  Chest:     Chest wall: No tenderness.  Abdominal:     General: Abdomen is flat. There is no distension.     Palpations: Abdomen is soft.     Tenderness: There is no abdominal tenderness. There is no guarding or rebound.  Musculoskeletal:  General: No tenderness or edema.     Cervical back: Neck supple.     Right lower leg: No edema.     Left lower leg: No edema.     Comments: Extremities warm and well perfused.  Skin:    General: Skin is warm and dry.     Findings: No rash.  Neurological:     General: No focal deficit present.     Mental Status: She is alert and oriented to person, place, and time.     GCS: GCS eye subscore is 4. GCS verbal subscore is 5. GCS motor subscore is 6.     Cranial Nerves: Cranial nerves are intact. No dysarthria or facial asymmetry.     Sensory: Sensation is intact. No sensory deficit.     Motor: Motor function is intact. No weakness.  Psychiatric:        Mood and Affect: Mood and affect and mood normal.        Behavior: Behavior normal.     ED Results / Procedures / Treatments   Labs (all labs ordered are listed, but only abnormal results are displayed) Labs Reviewed  BASIC METABOLIC PANEL - Abnormal; Notable for the following components:      Result Value   Glucose, Bld 110 (*)    Creatinine, Ser 1.75 (*)    GFR, Estimated 28 (*)    All other components within normal limits  CBC - Abnormal; Notable for the following components:   WBC 2.8 (*)    RBC 3.26 (*)    Hemoglobin 9.4 (*)    HCT 30.2 (*)    Platelets 134 (*)    All other components within normal limits  URINALYSIS, ROUTINE W REFLEX MICROSCOPIC - Abnormal; Notable for the following components:   Hgb urine dipstick LARGE (*)    All other  components within normal limits  BRAIN NATRIURETIC PEPTIDE - Abnormal; Notable for the following components:   B Natriuretic Peptide 571.0 (*)    All other components within normal limits  RESP PANEL BY RT-PCR (FLU A&B, COVID) [id number]    EKG EKG Interpretation  Date/Time:  Saturday December 14 2020 14:22:29 EST Ventricular Rate:  58 PR Interval:  230 QRS Duration: 88 QT Interval:  396 QTC Calculation: 388 R Axis:   -58 Text Interpretation: Sinus bradycardia with 1st degree A-V block Left axis deviation Anterior infarct , age undetermined Abnormal ECG Since last tracing rate slower Confirmed by Dorie Rank [phone]) on 12/14/2020 10:45:08 PM   Radiology DG Chest 2 View  Result Date: 12/14/2020 CLINICAL DATA:  Shortness of breath EXAM: CHEST - 2 VIEW COMPARISON:  Mar 26, 2020 chest radiograph and chest CT FINDINGS: There is chronic elevation of the right hemidiaphragm with atelectatic change in the right base. The lungs elsewhere are clear. The heart size and pulmonary vascularity are normal. No adenopathy. There is aortic atherosclerosis. No bone lesions. IMPRESSION: Chronic elevation of the right hemidiaphragm with right base atelectasis. Lungs elsewhere clear. Heart size within normal limits. Aortic Atherosclerosis (ICD10-I70.0). Electronically Signed   By: Lowella Grip III M.D.   On: 12/14/2020 14:45    Procedures Procedures   Medications Ordered in ED Medications  sodium chloride flush (NS) 0.9 % injection 3 mL (3 mLs Intravenous Given 12/14/20 1724)  lisinopril (ZESTRIL) tablet 10 mg (10 mg Oral Given 12/14/20 2040)    ED Course  I have reviewed the triage vital signs and the nursing notes.  Pertinent labs & imaging results that were  available during my care of the patient were reviewed by me and considered in my medical decision making (see chart for details).    MDM Rules/Calculators/A&P                          Patient is a well-appearing 44yoF with a history and  physical as described above who presents to the ED for generalized weakness, SOB, and cough. VS reassuring and HDS. Patient satting well on RA with no respiratory distress. Denies feeling SOB on initial exam. Initial workup includes CBC, BMP, BNP, UA, COVID, CXR, and ECG. Home lisinopril given for elevated BP since patient had not taken it today.  Diagnostic workup overall reassuring. Cr elevated but appears to be at patient's baseline. Hgb stable at 9.4 (9.5 on 09/13/20). BNP elevated but has been elevated previously; patient does not appear to be fluid overloaded on exam with no pulmonary edema on CXR. COVID negative. ECG reassuring for acute ischemic or dysrhythmic changes. On reassessment, patient resting comfortably. Overall diagnostic workup reassuring for acute emergent pathology at this time. HPI, physical exam, and diagnostic workup not consistent with ACS, acute CHF exacerbation, COPD exacerbation, PNA, sepsis, electrolyte derangement, acute anemia, cardiac dysrhythmia, intoxication, DKA, HHS, or CVA/TIA. Recommend follow up with PCP on outpatient basis for re-evaluation and redraw labs to monitor renal function and Hgb. Patient otherwise HDS and appropriate for discharge.  Strict return precautions provided and discussed. Questions and concerns addressed. Patient and daughter verbalized understanding and amenable with discharge plan. Patient discharged in stable condition.  Final Clinical Impression(s) / ED Diagnoses Final diagnoses:  Generalized weakness    Rx / DC Orders ED Discharge Orders    None       Christy Gentles, MD 12/15/20 Clayburn Pert, MD 12/15/20 [phone]

## 2020-12-17 ENCOUNTER — Telehealth: Payer: Self-pay | Admitting: Clinical

## 2020-12-17 NOTE — Telephone Encounter (Signed)
Dulce--can you have her fax the orders for my signature?

## 2020-12-17 NOTE — Telephone Encounter (Signed)
She is to use the biscodyl and senokot on an as needed basis and the Miralax is to be used on a regular basis as long as her stools are soft and formed.  If she develops loose stools or diarrhea, she needs to decrease the frequency and or dose of Miralax.    As for supplies needed to care for Erika Day, she needs to fax the orders to the clinic and I will sign. Ileana--I believe some of this is similar to what todays phone call entailed.

## 2020-12-18 NOTE — Telephone Encounter (Signed)
Erika Day and she agreed to fax the forms to [facility].

## 2020-12-18 NOTE — Telephone Encounter (Signed)
See if we can work her in in next 2-4 weeks.  In the ED, they did not find anything new--she was not having problems breathing as listed on the complaint for being seen and was felt to be stable.  If she is continuing to have symptoms, we can talk about seeing her sooner.

## 2020-12-18 NOTE — Telephone Encounter (Signed)
I informed Ms. Erika Day of medication instructions send by Dr. Jamesetta So. Kenton Kingfisher would like to know if you need to see [name] to follow up after she was at the hospital on Saturday. Please advise if appointment needs to be set.

## 2020-12-27 ENCOUNTER — Telehealth: Payer: Self-pay | Admitting: Internal Medicine

## 2020-12-30 ENCOUNTER — Encounter (HOSPITAL_COMMUNITY): Payer: Self-pay

## 2020-12-30 ENCOUNTER — Other Ambulatory Visit: Payer: Self-pay

## 2020-12-30 ENCOUNTER — Ambulatory Visit: Payer: Medicare Other

## 2020-12-30 ENCOUNTER — Ambulatory Visit (HOSPITAL_COMMUNITY)
Admission: RE | Admit: 2020-12-30 | Discharge: 2020-12-30 | Disposition: A | Discharge status: Home / Self Care | Payer: Medicare Other | Source: Ambulatory Visit | Attending: Surgery | Admitting: Surgery

## 2020-12-30 DIAGNOSIS — I739 Peripheral vascular disease, unspecified: Secondary | ICD-10-CM

## 2021-01-03 ENCOUNTER — Ambulatory Visit: Payer: Medicare Other | Admitting: Internal Medicine

## 2021-01-03 NOTE — Telephone Encounter (Signed)
Appointment was schedule for 01/03/2021. Patient was unable to attend her appointment due to a conflict of appointment and she will have to reschedule.

## 2021-01-06 NOTE — Telephone Encounter (Signed)
[place] appointment was reschedule for 01/08/2021

## 2021-01-08 ENCOUNTER — Ambulatory Visit: Payer: Medicare Other | Admitting: Internal Medicine

## 2021-01-27 ENCOUNTER — Emergency Department (HOSPITAL_COMMUNITY)

## 2021-01-27 ENCOUNTER — Emergency Department (HOSPITAL_COMMUNITY)
Admission: EM | Admit: 2021-01-27 | Discharge: 2021-01-27 | Disposition: A | Discharge status: Home / Self Care | Attending: Emergency Medicine | Admitting: Emergency Medicine

## 2021-01-27 DIAGNOSIS — Z79899 Other long term (current) drug therapy: Secondary | ICD-10-CM | POA: Diagnosis not present

## 2021-01-27 DIAGNOSIS — E1122 Type 2 diabetes mellitus with diabetic chronic kidney disease: Secondary | ICD-10-CM | POA: Insufficient documentation

## 2021-01-27 DIAGNOSIS — Z859 Personal history of malignant neoplasm, unspecified: Secondary | ICD-10-CM | POA: Insufficient documentation

## 2021-01-27 DIAGNOSIS — R1030 Lower abdominal pain, unspecified: Secondary | ICD-10-CM | POA: Diagnosis present

## 2021-01-27 DIAGNOSIS — R1084 Generalized abdominal pain: Secondary | ICD-10-CM | POA: Diagnosis not present

## 2021-01-27 DIAGNOSIS — R0602 Shortness of breath: Secondary | ICD-10-CM | POA: Diagnosis not present

## 2021-01-27 DIAGNOSIS — R509 Fever, unspecified: Secondary | ICD-10-CM | POA: Insufficient documentation

## 2021-01-27 DIAGNOSIS — Z87891 Personal history of nicotine dependence: Secondary | ICD-10-CM | POA: Diagnosis not present

## 2021-01-27 DIAGNOSIS — N1832 Chronic kidney disease, stage 3b: Secondary | ICD-10-CM | POA: Insufficient documentation

## 2021-01-27 DIAGNOSIS — K219 Gastro-esophageal reflux disease without esophagitis: Secondary | ICD-10-CM | POA: Diagnosis not present

## 2021-01-27 DIAGNOSIS — I509 Heart failure, unspecified: Secondary | ICD-10-CM | POA: Diagnosis not present

## 2021-01-27 DIAGNOSIS — R112 Nausea with vomiting, unspecified: Secondary | ICD-10-CM | POA: Diagnosis not present

## 2021-01-27 DIAGNOSIS — Z7902 Long term (current) use of antithrombotics/antiplatelets: Secondary | ICD-10-CM | POA: Insufficient documentation

## 2021-01-27 DIAGNOSIS — I130 Hypertensive heart and chronic kidney disease with heart failure and stage 1 through stage 4 chronic kidney disease, or unspecified chronic kidney disease: Secondary | ICD-10-CM | POA: Diagnosis not present

## 2021-01-27 LAB — COMPREHENSIVE METABOLIC PANEL
ALT: 15 U/L (ref 0–44)
AST: 21 U/L (ref 15–41)
Albumin: 3.6 g/dL (ref 3.5–5.0)
Alkaline Phosphatase: 56 U/L (ref 38–126)
Anion gap: 6 (ref 5–15)
BUN: 28 mg/dL — ABNORMAL HIGH (ref 8–23)
CO2: 27 mmol/L (ref 22–32)
Calcium: 9.6 mg/dL (ref 8.9–10.3)
Chloride: 102 mmol/L (ref 98–111)
Creatinine, Ser: 1.83 mg/dL — ABNORMAL HIGH (ref 0.44–1.00)
GFR, Estimated: 26 mL/min — ABNORMAL LOW (ref >60)
Glucose, Bld: 134 mg/dL — ABNORMAL HIGH (ref 70–99)
Potassium: 4.6 mmol/L (ref 3.5–5.1)
Sodium: 135 mmol/L (ref 135–145)
Total Bilirubin: 0.6 mg/dL (ref 0.3–1.2)
Total Protein: 7.2 g/dL (ref 6.5–8.1)

## 2021-01-27 LAB — CBC WITH DIFFERENTIAL/PLATELET
Abs Immature Granulocytes: 0.01 10*3/uL (ref 0.00–0.07)
Basophils Absolute: 0 10*3/uL (ref 0.0–0.1)
Basophils Relative: 1 %
Eosinophils Absolute: 0 10*3/uL (ref 0.0–0.5)
Eosinophils Relative: 1 %
HCT: 32.7 % — ABNORMAL LOW (ref 36.0–46.0)
Hemoglobin: 10.1 g/dL — ABNORMAL LOW (ref 12.0–15.0)
Immature Granulocytes: 0 %
Lymphocytes Relative: 33 %
Lymphs Abs: 1.4 10*3/uL (ref 0.7–4.0)
MCH: 28.1 pg (ref 26.0–34.0)
MCHC: 30.9 g/dL (ref 30.0–36.0)
MCV: 91.1 fL (ref 80.0–100.0)
Monocytes Absolute: 0.3 10*3/uL (ref 0.1–1.0)
Monocytes Relative: 7 %
Neutro Abs: 2.4 10*3/uL (ref 1.7–7.7)
Neutrophils Relative %: 58 %
Platelets: 163 10*3/uL (ref 150–400)
RBC: 3.59 MIL/uL — ABNORMAL LOW (ref 3.87–5.11)
RDW: 11.7 % (ref 11.5–15.5)
WBC: 4.2 10*3/uL (ref 4.0–10.5)
nRBC: 0 % (ref 0.0–0.2)

## 2021-01-27 LAB — URINALYSIS, ROUTINE W REFLEX MICROSCOPIC
Bilirubin Urine: NEGATIVE
Glucose, UA: NEGATIVE mg/dL
Hgb urine dipstick: NEGATIVE
Ketones, ur: NEGATIVE mg/dL
Leukocytes,Ua: NEGATIVE
Nitrite: NEGATIVE
Protein, ur: NEGATIVE mg/dL
Specific Gravity, Urine: 1.009 (ref 1.005–1.030)
pH: 7 (ref 5.0–8.0)

## 2021-01-27 LAB — LACTIC ACID, PLASMA: Lactic Acid, Venous: 1.3 mmol/L (ref 0.5–1.9)

## 2021-01-27 LAB — CBG MONITORING, ED: Glucose-Capillary: 102 mg/dL — ABNORMAL HIGH (ref 70–99)

## 2021-01-27 LAB — LIPASE, BLOOD: Lipase: 61 U/L — ABNORMAL HIGH (ref 11–51)

## 2021-01-27 MED ORDER — SODIUM CHLORIDE 0.9 % IV BOLUS: 1000.0000 mL | Freq: Once | INTRAVENOUS | Status: DC

## 2021-01-27 MED ORDER — SODIUM CHLORIDE 0.9 % IV BOLUS
500.0000 mL | Freq: Once | INTRAVENOUS | Status: AC
  Administered 2021-01-27: 500 mL via INTRAVENOUS

## 2021-01-27 NOTE — ED Provider Notes (Signed)
[facility] EMERGENCY DEPARTMENT Provider Note   CSN: [id number] Arrival date & time: 01/27/21  1203     History Chief Complaint  Patient presents with  . Abdominal Pain    Erika Day is a 87 y.o. female with PMH/o CHF, CLL, DDD, DM brought in by EMS for not feeling well.  Patient reports that her stomach has been bothering her over the weekend.  She states that it mostly hurts in the lower abdomen.  She reports that she has had some nausea as well as one episode of vomiting.  He states that she has not wanted to eat because of the symptoms.  She states that she was concerned that she was getting weak because of the symptoms which is what prompted her to call EMS.  She states she always has some difficulty breathing.  She denies any new or changing symptoms.  No new cough.  Denies any fever, dysuria, hematuria.  The history is provided by the patient.       Past Medical History:  Diagnosis Date  . Acid reflux disease   . Cancer ([place])   . CHF (congestive heart failure) ([place])   . CLL (chronic lymphocytic leukemia) ([place]) 05/15/2016  . DDD (degenerative disc disease), lumbar   . Degenerative joint disease   . Diabetes mellitus without complication [facility]    Patient states this was a misdiagnosis from years ago  . Dyslipidemia   . History of diabetes mellitus 04/19/2017  . Hypertension   . 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 Panic attacks   . Pneumonia 02/2016  . 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 Stroke [facility]) age 87 yo   poorly controlled DM and Hypertension  . Volvulus of sigmoid colon [facility])     Patient Active Problem List   Diagnosis Date Noted  . PAD (peripheral artery disease) ([place]) 06/17/2020  . Gastroesophageal reflux disease without esophagitis 04/10/2020  . Elevated d-dimer 03/27/2020  . Chronic kidney disease, stage 3b ([place]) 03/27/2020  . Uncontrolled type 2 diabetes mellitus with hyperglycemia ([place])  03/27/2020  . Pressure injury of skin 03/27/2020  . Goals of care, counseling/discussion 01/23/2020  . Recurrent falls 09/05/2019  . CHF (congestive heart failure) ([place]) 06/28/2019  . MGUS (monoclonal gammopathy of unknown significance) 02/11/2018  . History of diabetes mellitus 04/19/2017  . Hypercalcemia 02/12/2017  . Chronic leukopenia 02/12/2017  . Postoperative anemia due to acute blood loss 11/26/2016  . Fall 11/23/2016  . Closed fracture of intertrochanteric section of femur ([place]) 11/23/2016  . Pancytopenia ([place]) 11/13/2016  . Sinusitis 10/14/2016  . Bleeding external hemorrhoids 08/20/2016  . Recurrent ventral incisional hernia s/p primary repair 06/30/2016 07/01/2016  . Bilateral leg edema 05/20/2016  . Protein-calorie malnutrition, severe ([place]) 05/20/2016  . Sigmoid volvulus s/p lap sigmoid colectomy 06/30/2016 05/19/2016  . 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 Constipation 05/15/2016  . Other fatigue 05/15/2016  . Anemia 02/13/2016  . Thrombocytopenia ([place]) 02/13/2016  . Kyphosis 02/01/2016  . Pressure ulcer of coccygeal region 01/31/2016  . Mobility impaired 01/31/2016  . Osteoarthritis 01/31/2016  . Abdominal pain 04/23/2014  . Essential hypertension 09/06/2011  . Ovarian torsion 08/25/2011    Past Surgical History:  Procedure Laterality Date  . ABDOMINAL AORTOGRAM W/LOWER EXTREMITY N/A 06/17/2020   Procedure: ABDOMINAL AORTOGRAM W/LOWER EXTREMITY;  Surgeon: Waynetta Sandy, MD;  Location: [facility]  INVASIVE CV LAB;  Service: Cardiovascular;  Laterality: N/A;  . ABDOMINAL HYSTERECTOMY  ? 08/25/2011 ?   Has BSO for left benign ovarian tumor with torsion and broad ligament benign tumor, but does not appear had hysterectomy--[facility], NOT ovarian cancer  . CHOLECYSTECTOMY    . FEMUR IM NAIL Left 11/23/2016   Procedure: INTRAMEDULLARY (IM) NAIL FEMORAL;  Surgeon: Gaynelle Arabian, MD;   Location: [facility] ORS;  Service: Orthopedics;  Laterality: Left;  . FLEXIBLE SIGMOIDOSCOPY N/A 05/19/2016   Procedure: FLEXIBLE SIGMOIDOSCOPY;  Surgeon: [name], MD;  Location: [facility];  Service: Endoscopy;  Laterality: N/A;  . FLEXIBLE SIGMOIDOSCOPY N/A 06/28/2016   Procedure: FLEXIBLE SIGMOIDOSCOPY;  Surgeon: Ladene Artist, MD;  Location: [facility];  Service: Endoscopy;  Laterality: N/A;  . LAPAROSCOPIC SIGMOID COLECTOMY N/A 06/30/2016   Procedure: LAPAROSCOPIC SIGMOID COLECTOMY,rigid sigmoidoscopy, repair recurrent incisional hernia;  Surgeon: Michael Boston, MD;  Location: [facility];  Service: General;  Laterality: N/A;  . LAPAROTOMY N/A 04/29/2014   Procedure: EXPLORATORY LAPAROTOMY;  Surgeon: Imogene Burn. Georgette Dover, MD;  Location: [place];  Service: General;  Laterality: N/A;  . LAPAROTOMY N/A 04/16/2018   Procedure: EXPLORATORY LAPAROTOMY WITH LYSIS OF ADHESIONS;  Surgeon: Armandina Gemma, MD;  Location: [facility];  Service: General;  Laterality: N/A;  . LYSIS OF ADHESION N/A 04/29/2014   Procedure: EXTENSIVE LYSIS OF ADHESIONS;  Surgeon: Imogene Burn. Georgette Dover, MD;  Location: [place];  Service: General;  Laterality: N/A;  . PERIPHERAL VASCULAR INTERVENTION  06/17/2020   Procedure: PERIPHERAL VASCULAR INTERVENTION;  Surgeon: Waynetta Sandy, MD;  Location: [facility] CV LAB;  Service: Cardiovascular;;  right peroneal  . VENTRAL HERNIA REPAIR N/A 04/29/2014   Procedure: PRIMARY REPAIR OF VENTRAL HERNIA;  Surgeon: Imogene Burn. Georgette Dover, MD;  Location: [place] OR;  Service: General;  Laterality: N/A;     OB History   No obstetric history on file.     Family History  Problem Relation Age of Onset  . Hypertension Mother   . Stroke Mother   . Arthritis Mother   . Alcohol abuse Father     Social History   Tobacco Use  . Smoking status: Former Smoker    Types: Cigarettes    Quit date: 02/18/1990    Years since quitting: 30.9  . Smokeless tobacco: Former Systems developer    Types: Snuff    Quit date: 02/18/1990   Vaping Use  . Vaping Use: Never used  Substance Use Topics  . Alcohol use: No    Alcohol/week: 0.0 standard drinks    Comment: Only drank alcohol between ages of 70 and 78 yo.  Her father gave it to her  . Drug use: No    Home Medications Prior to Admission medications   Medication Sig Start Date End Date Taking? Authorizing Provider  bisacodyl (DULCOLAX) 5 MG EC tablet Take 5 mg by mouth daily as needed for moderate constipation.     [provider]  cetirizine (ZYRTEC) 10 MG tablet 1 tab by mouth once daily with evening meal for allergies 07/29/20   Mack Hook, MD  clopidogrel (PLAVIX) 75 MG tablet Take 1 tablet (75 mg total) by mouth daily with breakfast. 07/29/20   Mack Hook, MD  dorzolamide-timolol (COSOPT) 22.3-6.8 MG/ML ophthalmic solution Place 1 drop into both eyes 2 (two) times daily. 07/29/20   Mack Hook, MD  furosemide (LASIX) 40 MG tablet 1 tab by mouth daily with morning meal 07/29/20   Mack Hook, MD  latanoprost (XALATAN) 0.005 % ophthalmic solution  Place 1 drop into both eyes at bedtime. 07/29/20   Mack Hook, MD  lisinopril (ZESTRIL) 10 MG tablet 1 tab by mouth daily with morning meal 07/29/20   Mack Hook, MD  pantoprazole (PROTONIX) 40 MG tablet 1 tab by mouth 1/2 hour before breakfast on empty stomach. 07/29/20   Mack Hook, MD  polyethylene glycol (MIRALAX / GLYCOLAX) 17 g packet Mix 17 g with water and drink daily with evening meal 07/29/20   Mack Hook, MD  potassium chloride (KLOR-CON) 10 MEQ tablet 1 tab by mouth daily with morning meal 07/29/20   Mack Hook, MD  rosuvastatin (CRESTOR) 10 MG tablet 1 tab by mouth daily with evening meal 07/29/20   Mack Hook, MD  senna-docusate (SENOKOT-S) 8.6-50 MG tablet Take 1 tablet by mouth at bedtime as needed for mild constipation. 03/31/20   Damita Lack, MD    Allergies    Morphine and related  Review of Systems   Review of  Systems  Constitutional: Positive for appetite change. Negative for fatigue and fever.  Respiratory: Negative for cough and shortness of breath.   Cardiovascular: Negative for chest pain.  Gastrointestinal: Positive for abdominal pain and nausea. Negative for vomiting.  Genitourinary: Negative for dysuria and hematuria.  Neurological: Negative for headaches.  All other systems reviewed and are negative.   Physical Exam Updated Vital Signs BP (!) 155/67   Pulse 69   Temp 100 F (37.8 C) (Rectal)   Resp 14   SpO2 100%   Physical Exam Vitals and nursing note reviewed.  Constitutional:      Appearance: Normal appearance. She is well-developed.  HENT:     Head: Normocephalic and atraumatic.  Eyes:     General: Lids are normal.     Conjunctiva/sclera: Conjunctivae normal.     Pupils: Pupils are equal, round, and reactive to light.  Cardiovascular:     Rate and Rhythm: Normal rate and regular rhythm.     Pulses: Normal pulses.     Heart sounds: Normal heart sounds. No murmur heard. No friction rub. No gallop.   Pulmonary:     Effort: Pulmonary effort is normal.     Comments: No evidence of respiratory distress. Able to speak in full sentences. Mild coughing.  Abdominal:     Palpations: Abdomen is soft. Abdomen is not rigid.     Tenderness: There is abdominal tenderness in the periumbilical area. There is no guarding.     Comments: Abdomen soft, nondistended.  Tenderness noted periumbilical region.  No rigidity, guarding.  Musculoskeletal:        General: Normal range of motion.     Cervical back: Full passive range of motion without pain.     Comments: BLE are wrapped in UNA boots bilaterally.   Skin:    General: Skin is warm and dry.     Capillary Refill: Capillary refill takes less than 2 seconds.  Neurological:     Mental Status: She is alert and oriented to person, place, and time.  Psychiatric:        Speech: Speech normal.     ED Results / Procedures / Treatments    Labs (all labs ordered are listed, but only abnormal results are displayed) Labs Reviewed  COMPREHENSIVE METABOLIC PANEL - Abnormal; Notable for the following components:      Result Value   Glucose, Bld 134 (*)    BUN 28 (*)    Creatinine, Ser 1.83 (*)    GFR, Estimated 26 (*)  All other components within normal limits  CBC WITH DIFFERENTIAL/PLATELET - Abnormal; Notable for the following components:   RBC 3.59 (*)    Hemoglobin 10.1 (*)    HCT 32.7 (*)    All other components within normal limits  LIPASE, BLOOD - Abnormal; Notable for the following components:   Lipase 61 (*)    All other components within normal limits  CBG MONITORING, ED - Abnormal; Notable for the following components:   Glucose-Capillary 102 (*)    All other components within normal limits  URINALYSIS, ROUTINE W REFLEX MICROSCOPIC  LACTIC ACID, PLASMA  POC SARS CORONAVIRUS 2 AG -  ED    EKG EKG Interpretation  Date/Time:  Monday January 27 2021 12:24:24 EDT Ventricular Rate:  62 PR Interval:    QRS Duration: 105 QT Interval:  401 QTC Calculation: 408 R Axis:   159 Text Interpretation: sinus bradycardia wiht 1rst degree av block Anterior infarct, old Borderline ST elevation, lateral leads No significant change since last tracing Confirmed by Deno Etienne [phone]) on 01/27/2021 1:50:30 PM   Radiology DG Chest Portable 1 View  Result Date: 01/27/2021 CLINICAL DATA:  Headache.  Shortness of breath. EXAM: PORTABLE CHEST 1 VIEW COMPARISON:  Two-view chest x-ray 12/14/2020 FINDINGS: Heart is enlarged. Atherosclerotic calcifications are present at the aortic arch. Chronic elevation of the right hemidiaphragm again noted. Right basilar atelectasis present. Left lung is clear. Aeration in lung volume of the left hemithorax improved. Axial skeleton unremarkable. IMPRESSION: 1. Stable cardiomegaly without failure. 2. Chronic elevation of the right hemidiaphragm. 3. Right basilar atelectasis. Electronically Signed    By: San Morelle M.D.   On: 01/27/2021 13:08    Procedures Procedures   Medications Ordered in ED Medications  sodium chloride 0.9 % bolus 500 mL (500 mLs Intravenous New Bag/Given 01/27/21 1305)    ED Course  I have reviewed the triage vital signs and the nursing notes.  Pertinent labs & imaging results that were available during my care of the patient were reviewed by me and considered in my medical decision making (see chart for details).    MDM Rules/Calculators/A&P                          87 y.o. F with PMH/o CHF, CLL, DM who presents for evaluation of not feeling well, abdominal pain.  She states she has not been eating as much.  States that she was concerned she was getting weak.  Denies any urinary complaints.  She reports she always has some shortness of breath.  On initial initial arrival, she has a low-grade temperature.  Vital stable.  On exam, she has some mild periumbilical abdominal pain.  Concern for viral GI process versus infectious abdominal process.  Plan check labs.  Lipase is elevated at 61.  CMP shows BUN of 28, creatinine of 1.83.  CBC shows no leukocytosis.  Hemoglobin is 10.1. She has had some mild elevations in the past but that was about 3 years ago.  None recently.  Her BUN and creatinine fluctuates between 1.5-1.9.  She did have a creatinine of 2.2 at 1 point.  Patient signed out to Dr. Darden Amber pending CT abd/pelvis and Urine.   Portions of this note were generated with Lobbyist. Dictation errors may occur despite best attempts at proofreading.   Final Clinical Impression(s) / ED Diagnoses Final diagnoses:  Generalized abdominal pain    Rx / DC Orders ED Discharge Orders  None       Desma Mcgregor 01/27/21 [place], DO 01/27/21 1513

## 2021-01-27 NOTE — Discharge Instructions (Addendum)
-   Your CT scan was normal today.  - Your labs did not show any signs of infection, UTI, or electrolyte abnormalities - Please follow up with your primary doctor - You may continue to take your prescribed medications as normal.

## 2021-01-27 NOTE — ED Triage Notes (Signed)
TC to Husband to confirm he was home. [facility]  Is transporting Pt home.

## 2021-01-27 NOTE — ED Notes (Signed)
Called [facility] at [facility] request; patient next in line.

## 2021-01-27 NOTE — ED Provider Notes (Signed)
Erika Day is an 87 year old female who presents with overall feeling of being "unwell".  She reports abdominal pain that started Saturday.  She has vomited only one time.  She has had decreased appetite and mild shortness of breath.  On exam she has periumbilical tenderness.  She has had a full work-up just now pending results of her urinalysis and CT abdomen and pelvis to evaluate for intra-abdominal pathologies.  She is overall well-appearing and her symptoms are very close to her baseline per her daughter.  We will follow-up the UA and the CT abdomen and pelvis and then place disposition from there.  If these are within normal limits, patient can likely go home.  Physical Exam  BP (!) 155/67   Pulse 69   Temp 100 F (37.8 C) (Rectal)   Resp 14   SpO2 100%   Physical Exam Vitals and nursing note reviewed.  Constitutional:      Appearance: She is well-developed.  HENT:     Head: Normocephalic and atraumatic.  Eyes:     Conjunctiva/sclera: Conjunctivae normal.  Cardiovascular:     Rate and Rhythm: Normal rate and regular rhythm.  Pulmonary:     Effort: Pulmonary effort is normal. No respiratory distress.  Abdominal:     General: There is no distension.     Palpations: Abdomen is soft.     Tenderness: There is abdominal tenderness in the periumbilical area.  Musculoskeletal:     Cervical back: Neck supple.  Skin:    General: Skin is warm and dry.  Neurological:     Mental Status: She is alert.     ED Course/Procedures     Procedures  MDM    Labs Reviewed  COMPREHENSIVE METABOLIC PANEL - Abnormal; Notable for the following components:      Result Value   Glucose, Bld 134 (*)    BUN 28 (*)    Creatinine, Ser 1.83 (*)    GFR, Estimated 26 (*)    All other components within normal limits  CBC WITH DIFFERENTIAL/PLATELET - Abnormal; Notable for the following components:   RBC 3.59 (*)    Hemoglobin 10.1 (*)    HCT 32.7 (*)    All other components within normal limits   LIPASE, BLOOD - Abnormal; Notable for the following components:   Lipase 61 (*)    All other components within normal limits  CBG MONITORING, ED - Abnormal; Notable for the following components:   Glucose-Capillary 102 (*)    All other components within normal limits  URINALYSIS, ROUTINE W REFLEX MICROSCOPIC  LACTIC ACID, PLASMA  POC SARS CORONAVIRUS 2 AG -  ED    CT ABDOMEN AND PELVIS WITHOUT CONTRAST  "TECHNIQUE: Multidetector CT imaging of the abdomen and pelvis was performed following the standard protocol without IV contrast.  COMPARISON:  Mar 26, 2020.  FINDINGS: Lower chest: Severely elevated right hemidiaphragm is noted. Mild right basilar subsegmental atelectasis is noted. Visualized left lung base is unremarkable.  Hepatobiliary: No focal liver abnormality is seen. Status post cholecystectomy. No biliary dilatation.  Pancreas: Unremarkable. No pancreatic ductal dilatation or surrounding inflammatory changes.  Spleen: Normal in size without focal abnormality.  Adrenals/Urinary Tract: Adrenal glands are unremarkable. Kidneys are normal, without renal calculi, focal lesion, or hydronephrosis. Bladder is unremarkable.  Stomach/Bowel: The stomach appears normal. There is no evidence of bowel obstruction or inflammation. The appendix is not clearly visualized. Moderate amount of stool seen in the rectum.  Vascular/Lymphatic: Aortic atherosclerosis. No enlarged abdominal or pelvic  lymph nodes.  Reproductive: Status post hysterectomy. No adnexal masses.  Other: No abdominal wall hernia or abnormality. No abdominopelvic ascites.  Musculoskeletal: Severe multilevel degenerative disc disease is noted in the lumbar spine. No acute osseous abnormality is noted.  IMPRESSION: 1. Severely elevated right hemidiaphragm is noted with mild right basilar subsegmental atelectasis. 2. Aortic atherosclerosis. 3. No acute abnormality seen in the abdomen or  pelvis."  Urinalysis without signs of dehydration or infection. CT as above.   I discussed these results with patient at bedside.  I informed her of her results.  We discussed discharge and close follow-up with her PCP. COVID test obtained. Patient will follow up results on MyChart.  Stable for discharge home.        Erika Day, [place], MD 01/27/21 0165    Erika Morrison, MD 01/27/21 2312

## 2021-01-27 NOTE — Progress Notes (Signed)
[employer]ufacturing [employer] [facility])  Erika Day is our current hospice patient.   Her family helps with decision making.  Hospice will continue to follow.  Venia Carbon RN, BSN, [facility] Liaison

## 2021-01-27 NOTE — ED Triage Notes (Signed)
Pt came in via [facility] from home. Pt has been having abdominal pain since Friday. Hasn't been  Drinking/ eating since Friday. Pt c/o headache,neck pain and abdominal pain.   CBG 132 70 HR 160/78 BP 96% RA

## 2021-01-30 ENCOUNTER — Ambulatory Visit: Payer: Medicare Other

## 2021-01-30 ENCOUNTER — Ambulatory Visit (HOSPITAL_COMMUNITY): Payer: Medicare Other | Attending: Vascular Surgery

## 2021-01-30 ENCOUNTER — Ambulatory Visit (HOSPITAL_COMMUNITY): Admission: RE | Admit: 2021-01-30 | Payer: Medicare Other | Source: Ambulatory Visit

## 2021-02-26 ENCOUNTER — Ambulatory Visit (HOSPITAL_COMMUNITY)

## 2021-02-26 ENCOUNTER — Ambulatory Visit

## 2021-02-26 ENCOUNTER — Ambulatory Visit (HOSPITAL_COMMUNITY): Attending: Vascular Surgery

## 2021-02-27 ENCOUNTER — Emergency Department (HOSPITAL_COMMUNITY)
Admission: EM | Admit: 2021-02-27 | Discharge: 2021-02-27 | Disposition: A | Discharge status: Home / Self Care | Attending: Emergency Medicine | Admitting: Emergency Medicine

## 2021-02-27 ENCOUNTER — Other Ambulatory Visit: Payer: Self-pay

## 2021-02-27 ENCOUNTER — Encounter (HOSPITAL_COMMUNITY): Payer: Self-pay

## 2021-02-27 DIAGNOSIS — R1013 Epigastric pain: Secondary | ICD-10-CM | POA: Diagnosis not present

## 2021-02-27 DIAGNOSIS — E1122 Type 2 diabetes mellitus with diabetic chronic kidney disease: Secondary | ICD-10-CM | POA: Insufficient documentation

## 2021-02-27 DIAGNOSIS — Z87891 Personal history of nicotine dependence: Secondary | ICD-10-CM | POA: Diagnosis not present

## 2021-02-27 DIAGNOSIS — Z79899 Other long term (current) drug therapy: Secondary | ICD-10-CM | POA: Insufficient documentation

## 2021-02-27 DIAGNOSIS — I509 Heart failure, unspecified: Secondary | ICD-10-CM | POA: Insufficient documentation

## 2021-02-27 DIAGNOSIS — I130 Hypertensive heart and chronic kidney disease with heart failure and stage 1 through stage 4 chronic kidney disease, or unspecified chronic kidney disease: Secondary | ICD-10-CM | POA: Diagnosis not present

## 2021-02-27 DIAGNOSIS — R079 Chest pain, unspecified: Secondary | ICD-10-CM | POA: Diagnosis not present

## 2021-02-27 DIAGNOSIS — N1832 Chronic kidney disease, stage 3b: Secondary | ICD-10-CM | POA: Insufficient documentation

## 2021-02-27 DIAGNOSIS — Z7902 Long term (current) use of antithrombotics/antiplatelets: Secondary | ICD-10-CM | POA: Diagnosis not present

## 2021-02-27 NOTE — ED Notes (Signed)
Pts husband notified of pt coming home vis EMS

## 2021-02-27 NOTE — Discharge Instructions (Addendum)
1.  Continue all of your home medications. 2.  The hospice nurse will check on you tomorrow.

## 2021-02-27 NOTE — ED Notes (Signed)
Pt remains pain free. Given another blanket.

## 2021-02-27 NOTE — ED Triage Notes (Signed)
[facility] from home. Pt c/o chest pain at 0830 5/10 left sided. Pt is Hospice. Hospice nurse inst home health nurse to have pt transported to ED.Pt given 324 ASA prior to transport. Pt denies pain at this time. A&O X3

## 2021-02-27 NOTE — Progress Notes (Signed)
[facility])      This patient is enrolled in hospice services with a terminal diagnosis of aortic stenosis.  [facility] will continue to follow for any discharge planning needs and to coordinate continuation of hospice care.    Thank you for the opportunity to participate in this patient's care.     Domenic Moras, BSN, RN [facility] Liaison  [phone]  [phone] (24h on call)

## 2021-02-27 NOTE — ED Provider Notes (Signed)
[place] EMERGENCY DEPARTMENT Provider Note   CSN: [id number] Arrival date & time: 02/27/21  1019     History Chief Complaint  Patient presents with  . Chest Pain    Erika Day is a 87 y.o. female.  HPI Patient has history of aortic stenosis and multiple medical comorbidities.  She has a caregiver at home and is cared for by hospice.  Patient reports this morning she got a pain that was kind of in her epigastrium and radiate up into her chest.  She reports sometimes when it happens it helps to go outside and get some fresh air.  She reports she went to get some cool air but it did not help and she still had pain.  She does not endorse other associated symptoms.  She reports her home health aide called the hospice nurse and they determined to bring her to the emergency department for evaluation.  She was given aspirin prior to arrival to the emergency department.  She is no longer endorsing any pain.  She reports it is gone.  She is denying recent cough fever vomiting she reports her appetite has diminished over time but she is still eating things like applesauce, toast and mashed potatoes.    Past Medical History:  Diagnosis Date  . Acid reflux disease   . Cancer ([place])   . CHF (congestive heart failure) ([place])   . CLL (chronic lymphocytic leukemia) ([place]) 05/15/2016  . DDD (degenerative disc disease), lumbar   . Degenerative joint disease   . Diabetes mellitus without complication [facility])    Patient states this was a misdiagnosis from years ago  . Dyslipidemia   . History of diabetes mellitus 04/19/2017  . Hypertension   . 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 Panic attacks   . Pneumonia 02/2016  . 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 Stroke [facility]) age 83 yo   poorly controlled DM and Hypertension  . Volvulus of sigmoid colon [facility])     Patient Active Problem List   Diagnosis Date Noted  . PAD (peripheral artery  disease) ([place]) 06/17/2020  . Gastroesophageal reflux disease without esophagitis 04/10/2020  . Elevated d-dimer 03/27/2020  . Chronic kidney disease, stage 3b ([place]) 03/27/2020  . Uncontrolled type 2 diabetes mellitus with hyperglycemia ([place]) 03/27/2020  . Pressure injury of skin 03/27/2020  . Goals of care, counseling/discussion 01/23/2020  . Recurrent falls 09/05/2019  . CHF (congestive heart failure) ([place]) 06/28/2019  . MGUS (monoclonal gammopathy of unknown significance) 02/11/2018  . History of diabetes mellitus 04/19/2017  . Hypercalcemia 02/12/2017  . Chronic leukopenia 02/12/2017  . Postoperative anemia due to acute blood loss 11/26/2016  . Fall 11/23/2016  . Closed fracture of intertrochanteric section of femur ([place]) 11/23/2016  . Pancytopenia ([place]) 11/13/2016  . Sinusitis 10/14/2016  . Bleeding external hemorrhoids 08/20/2016  . Recurrent ventral incisional hernia s/p primary repair 06/30/2016 07/01/2016  . Bilateral leg edema 05/20/2016  . Protein-calorie malnutrition, severe ([place]) 05/20/2016  . Sigmoid volvulus s/p lap sigmoid colectomy 06/30/2016 05/19/2016  . 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 Constipation 05/15/2016  . Other fatigue 05/15/2016  . Anemia 02/13/2016  . Thrombocytopenia ([place]) 02/13/2016  . Kyphosis 02/01/2016  . Pressure ulcer of coccygeal region 01/31/2016  . Mobility impaired 01/31/2016  . Osteoarthritis 01/31/2016  . Abdominal pain 04/23/2014  . Essential hypertension 09/06/2011  .  Ovarian torsion 08/25/2011    Past Surgical History:  Procedure Laterality Date  . ABDOMINAL AORTOGRAM W/LOWER EXTREMITY N/A 06/17/2020   Procedure: ABDOMINAL AORTOGRAM W/LOWER EXTREMITY;  Surgeon: Waynetta Sandy, MD;  Location: [facility] CV LAB;  Service: Cardiovascular;  Laterality: N/A;  . ABDOMINAL HYSTERECTOMY  ? 08/25/2011 ?   Has BSO for left benign ovarian tumor  with torsion and broad ligament benign tumor, but does not appear had hysterectomy--[facility], NOT ovarian cancer  . CHOLECYSTECTOMY    . FEMUR IM NAIL Left 11/23/2016   Procedure: INTRAMEDULLARY (IM) NAIL FEMORAL;  Surgeon: Gaynelle Arabian, MD;  Location: [facility];  Service: Orthopedics;  Laterality: Left;  . FLEXIBLE SIGMOIDOSCOPY N/A 05/19/2016   Procedure: FLEXIBLE SIGMOIDOSCOPY;  Surgeon: [name], MD;  Location: [facility];  Service: Endoscopy;  Laterality: N/A;  . FLEXIBLE SIGMOIDOSCOPY N/A 06/28/2016   Procedure: FLEXIBLE SIGMOIDOSCOPY;  Surgeon: Ladene Artist, MD;  Location: [facility];  Service: Endoscopy;  Laterality: N/A;  . LAPAROSCOPIC SIGMOID COLECTOMY N/A 06/30/2016   Procedure: LAPAROSCOPIC SIGMOID COLECTOMY,rigid sigmoidoscopy, repair recurrent incisional hernia;  Surgeon: Michael Boston, MD;  Location: [facility];  Service: General;  Laterality: N/A;  . LAPAROTOMY N/A 04/29/2014   Procedure: EXPLORATORY LAPAROTOMY;  Surgeon: Imogene Burn. Georgette Dover, MD;  Location: [place];  Service: General;  Laterality: N/A;  . LAPAROTOMY N/A 04/16/2018   Procedure: EXPLORATORY LAPAROTOMY WITH LYSIS OF ADHESIONS;  Surgeon: Armandina Gemma, MD;  Location: [facility];  Service: General;  Laterality: N/A;  . LYSIS OF ADHESION N/A 04/29/2014   Procedure: EXTENSIVE LYSIS OF ADHESIONS;  Surgeon: Imogene Burn. Georgette Dover, MD;  Location: [place];  Service: General;  Laterality: N/A;  . PERIPHERAL VASCULAR INTERVENTION  06/17/2020   Procedure: PERIPHERAL VASCULAR INTERVENTION;  Surgeon: Waynetta Sandy, MD;  Location: [facility] [facility] CV LAB;  Service: Cardiovascular;;  right peroneal  . VENTRAL HERNIA REPAIR N/A 04/29/2014   Procedure: PRIMARY REPAIR OF VENTRAL HERNIA;  Surgeon: Imogene Burn. Georgette Dover, MD;  Location: [place] OR;  Service: General;  Laterality: N/A;     OB History   No obstetric history on file.     Family History  Problem Relation Age of Onset  . Hypertension Mother   . Stroke Mother   . Arthritis Mother   . Alcohol  abuse Father     Social History   Tobacco Use  . Smoking status: Former Smoker    Types: Cigarettes    Quit date: 02/18/1990    Years since quitting: 31.0  . Smokeless tobacco: Former Systems developer    Types: Snuff    Quit date: 02/18/1990  Vaping Use  . Vaping Use: Never used  Substance Use Topics  . Alcohol use: No    Alcohol/week: 0.0 standard drinks    Comment: Only drank alcohol between ages of 68 and 21 yo.  Her father gave it to her  . Drug use: No    Home Medications Prior to Admission medications   Medication Sig Start Date End Date Taking? Authorizing Provider  bisacodyl (DULCOLAX) 5 MG EC tablet Take 5 mg by mouth daily as needed for moderate constipation.     [provider]  cetirizine (ZYRTEC) 10 MG tablet 1 tab by mouth once daily with evening meal for allergies 07/29/20   Mack Hook, MD  clopidogrel (PLAVIX) 75 MG tablet Take 1 tablet (75 mg total) by mouth daily with breakfast. 07/29/20   Mack Hook, MD  dorzolamide-timolol (COSOPT) 22.3-6.8 MG/ML ophthalmic solution Place 1 drop into both eyes  2 (two) times daily. 07/29/20   Mack Hook, MD  furosemide (LASIX) 40 MG tablet 1 tab by mouth daily with morning meal 07/29/20   Mack Hook, MD  latanoprost (XALATAN) 0.005 % ophthalmic solution Place 1 drop into both eyes at bedtime. 07/29/20   Mack Hook, MD  lisinopril (ZESTRIL) 10 MG tablet 1 tab by mouth daily with morning meal 07/29/20   Mack Hook, MD  pantoprazole (PROTONIX) 40 MG tablet 1 tab by mouth 1/2 hour before breakfast on empty stomach. 07/29/20   Mack Hook, MD  polyethylene glycol (MIRALAX / GLYCOLAX) 17 g packet Mix 17 g with water and drink daily with evening meal 07/29/20   Mack Hook, MD  potassium chloride (KLOR-CON) 10 MEQ tablet 1 tab by mouth daily with morning meal 07/29/20   Mack Hook, MD  rosuvastatin (CRESTOR) 10 MG tablet 1 tab by mouth daily with evening meal 07/29/20    Mack Hook, MD  senna-docusate (SENOKOT-S) 8.6-50 MG tablet Take 1 tablet by mouth at bedtime as needed for mild constipation. 03/31/20   Damita Lack, MD    Allergies    Morphine and related  Review of Systems   Review of Systems 10 systems reviewed negative except as per HPI Physical Exam Updated Vital Signs BP (!) 111/51   Pulse (!) 54   Temp 97.7 F (36.5 C) (Oral)   Resp 15   Ht 5\' 5"  (1.651 m)   Wt 72.6 kg   SpO2 100%   BMI 26.63 kg/m   Physical Exam Constitutional:      Comments: Patient is alert in no acute distress.  No respiratory distress.  HENT:     Head: Normocephalic and atraumatic.     Mouth/Throat:     Pharynx: Oropharynx is clear.  Eyes:     Extraocular Movements: Extraocular movements intact.  Cardiovascular:     Comments: Heart regular 3\6 systolic ejection Pulmonary:     Comments: No respiratory distress lungs are grossly clear. Abdominal:     General: There is no distension.     Palpations: Abdomen is soft.     Tenderness: There is no abdominal tenderness. There is no guarding.  Musculoskeletal:     Comments: Both lower extremities have clean, well dressed, Ace wraps applied.  Skin:    General: Skin is warm and dry.  Neurological:     Comments: Patient is alert and situationally oriented.  No focal motor deficits  Psychiatric:        Mood and Affect: Mood normal.     ED Results / Procedures / Treatments   Labs (all labs ordered are listed, but only abnormal results are displayed) Labs Reviewed - No data to display  EKG None  Radiology No results found.  Procedures Procedures   Medications Ordered in ED Medications - No data to display  ED Course  I have reviewed the triage vital signs and the nursing notes.  Pertinent labs & imaging results that were available during my care of the patient were reviewed by me and considered in my medical decision making (see chart for details).    MDM Rules/Calculators/A&P                           Patient presents as outlined.  She has aortic stenosis and multiple medical comorbidities.  Patient is alert and in no distress.  Clinically she is well in appearance today.  Her vital signs are normal.  She  had self-limited epigastric and chest pain without associated symptoms.  Patient is cared for by hospice.  They will do recheck tomorrow.  At this time, with no acute findings and patient well and comfortable in appearance, no diagnostic evaluation undertaken. Final Clinical Impression(s) / ED Diagnoses Final diagnoses:  Nonspecific chest pain    Rx / DC Orders ED Discharge Orders    None       Charlesetta Shanks, MD 02/27/21 1249

## 2021-05-08 ENCOUNTER — Other Ambulatory Visit: Payer: Self-pay

## 2021-05-08 ENCOUNTER — Emergency Department (HOSPITAL_COMMUNITY)
Admission: EM | Admit: 2021-05-08 | Discharge: 2021-05-08 | Disposition: A | Discharge status: Home / Self Care | Attending: Emergency Medicine | Admitting: Emergency Medicine

## 2021-05-08 ENCOUNTER — Emergency Department (HOSPITAL_COMMUNITY)

## 2021-05-08 DIAGNOSIS — I130 Hypertensive heart and chronic kidney disease with heart failure and stage 1 through stage 4 chronic kidney disease, or unspecified chronic kidney disease: Secondary | ICD-10-CM | POA: Insufficient documentation

## 2021-05-08 DIAGNOSIS — D61818 Other pancytopenia: Secondary | ICD-10-CM | POA: Diagnosis not present

## 2021-05-08 DIAGNOSIS — Z87891 Personal history of nicotine dependence: Secondary | ICD-10-CM | POA: Insufficient documentation

## 2021-05-08 DIAGNOSIS — Z859 Personal history of malignant neoplasm, unspecified: Secondary | ICD-10-CM | POA: Diagnosis not present

## 2021-05-08 DIAGNOSIS — I509 Heart failure, unspecified: Secondary | ICD-10-CM | POA: Insufficient documentation

## 2021-05-08 DIAGNOSIS — Z79899 Other long term (current) drug therapy: Secondary | ICD-10-CM | POA: Insufficient documentation

## 2021-05-08 DIAGNOSIS — E1122 Type 2 diabetes mellitus with diabetic chronic kidney disease: Secondary | ICD-10-CM | POA: Insufficient documentation

## 2021-05-08 DIAGNOSIS — N1832 Chronic kidney disease, stage 3b: Secondary | ICD-10-CM | POA: Diagnosis not present

## 2021-05-08 DIAGNOSIS — Z7902 Long term (current) use of antithrombotics/antiplatelets: Secondary | ICD-10-CM | POA: Diagnosis not present

## 2021-05-08 DIAGNOSIS — D631 Anemia in chronic kidney disease: Secondary | ICD-10-CM | POA: Insufficient documentation

## 2021-05-08 DIAGNOSIS — R079 Chest pain, unspecified: Secondary | ICD-10-CM

## 2021-05-08 LAB — BASIC METABOLIC PANEL
Anion gap: 7 (ref 5–15)
BUN: 24 mg/dL — ABNORMAL HIGH (ref 8–23)
CO2: 27 mmol/L (ref 22–32)
Calcium: 9.9 mg/dL (ref 8.9–10.3)
Chloride: 97 mmol/L — ABNORMAL LOW (ref 98–111)
Creatinine, Ser: 1.86 mg/dL — ABNORMAL HIGH (ref 0.44–1.00)
GFR, Estimated: 26 mL/min — ABNORMAL LOW (ref >60)
Glucose, Bld: 144 mg/dL — ABNORMAL HIGH (ref 70–99)
Potassium: 4.5 mmol/L (ref 3.5–5.1)
Sodium: 131 mmol/L — ABNORMAL LOW (ref 135–145)

## 2021-05-08 LAB — CBC
HCT: 31.6 % — ABNORMAL LOW (ref 36.0–46.0)
Hemoglobin: 10 g/dL — ABNORMAL LOW (ref 12.0–15.0)
MCH: 27.9 pg (ref 26.0–34.0)
MCHC: 31.6 g/dL (ref 30.0–36.0)
MCV: 88.3 fL (ref 80.0–100.0)
Platelets: 114 10*3/uL — ABNORMAL LOW (ref 150–400)
RBC: 3.58 MIL/uL — ABNORMAL LOW (ref 3.87–5.11)
RDW: 12.5 % (ref 11.5–15.5)
WBC: 3 10*3/uL — ABNORMAL LOW (ref 4.0–10.5)
nRBC: 0 % (ref 0.0–0.2)

## 2021-05-08 LAB — TROPONIN I (HIGH SENSITIVITY)
Troponin I (High Sensitivity): 16 ng/L (ref <18)
Troponin I (High Sensitivity): 16 ng/L (ref <18)

## 2021-05-08 NOTE — ED Notes (Signed)
[facility] called  

## 2021-05-08 NOTE — Progress Notes (Signed)
[facility] ED 02- [facility]) Liaison Note     This patient an active hospice patient with [facility], admitted on 01/01/21 with a terminal diagnosis of aortic stenosis.  [facility] will continue to follow for any discharge planning needs and to coordinate continuation of hospice care.    Thank you for the opportunity to participate in this patient's care.     Domenic Moras, BSN, RN [facility] Liaison [phone] (24h on call)

## 2021-05-08 NOTE — ED Triage Notes (Signed)
Patient BIB [facility] from home where she lives with family with complaint of chest pain that woke her up sleep this morning. Patient states she poured water on herself to alleviate pain without success. Patient given 324 mg aspirin PTA by EMS. Patient alert, oriented, and in no apparent distress at this time.

## 2021-05-08 NOTE — Discharge Instructions (Addendum)
Continue your usual treatments at home.  Follow-up with hospice care providers as well as your PCP for ongoing management and care as needed.

## 2021-05-08 NOTE — ED Provider Notes (Signed)
[facility] EMERGENCY DEPARTMENT Provider Note   CSN: [id number] Arrival date & time: 05/08/21  0800     History Chief Complaint  Patient presents with   Chest Pain    Erika Day is a 87 y.o. female.  HPI She presents for evaluation of chest pain which awoke her from sleep this morning.  She was transferred by EMS who treated her with aspirin during transport.  Patient states she had a heavy sensation in her upper abdomen that made it hard to take a deep breath, earlier this morning.  She has had some cough recently that is not productive.  She denies other recent problems including nausea, vomiting, fever, chills.  Patient alone on initial evaluation, no family members at bedside.  There are no other known active modifying factors.    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   Stroke [facility]) age 89 yo   poorly controlled DM and Hypertension   Volvulus of sigmoid [facility])     Patient Active Problem List   Diagnosis Date Noted   PAD (peripheral artery disease) ([place]) 06/17/2020   Gastroesophageal reflux disease without esophagitis 04/10/2020   Elevated d-dimer 03/27/2020   Chronic kidney disease, stage 3b ([place]) 03/27/2020   Uncontrolled type 2 diabetes mellitus with hyperglycemia ([place]) 03/27/2020   Pressure injury of skin 03/27/2020   Goals of care, counseling/discussion 01/23/2020   Recurrent falls 09/05/2019   CHF  (congestive heart failure) ([place]) 06/28/2019   MGUS (monoclonal gammopathy of unknown significance) 02/11/2018   History of diabetes mellitus 04/19/2017   Hypercalcemia 02/12/2017   Chronic leukopenia 02/12/2017   Postoperative anemia due to acute blood loss 11/26/2016   Fall 11/23/2016   Closed fracture of intertrochanteric section of femur ([place]) 11/23/2016   Pancytopenia ([place]) 11/13/2016   Sinusitis 10/14/2016   Bleeding external hemorrhoids 08/20/2016   Recurrent ventral incisional hernia s/p primary repair 06/30/2016 07/01/2016   Bilateral leg edema 05/20/2016   Protein-calorie malnutrition, severe ([place]) 05/20/2016   Sigmoid volvulus s/p lap sigmoid colectomy 06/30/2016 05/19/2016   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   Constipation 05/15/2016   Other fatigue 05/15/2016   Anemia 02/13/2016   Thrombocytopenia ([place]) 02/13/2016   Kyphosis 02/01/2016   Pressure ulcer of coccygeal region 01/31/2016   Mobility impaired 01/31/2016   Osteoarthritis 01/31/2016   Abdominal pain 04/23/2014   Essential hypertension 09/06/2011   Ovarian torsion 08/25/2011    Past Surgical History:  Procedure Laterality Date   ABDOMINAL AORTOGRAM W/LOWER EXTREMITY N/A 06/17/2020   Procedure: ABDOMINAL AORTOGRAM W/LOWER EXTREMITY;  Surgeon: Waynetta Sandy, MD;  Location: [place] CV LAB;  Service: Cardiovascular;  Laterality: N/A;   ABDOMINAL HYSTERECTOMY  ? 08/25/2011 ?   Has BSO for left benign ovarian tumor with torsion and broad ligament benign tumor, but does  not appear had hysterectomy--[facility], NOT ovarian cancer   CHOLECYSTECTOMY     FEMUR IM NAIL Left 11/23/2016   Procedure: INTRAMEDULLARY (IM) NAIL FEMORAL;  Surgeon: Gaynelle Arabian, MD;  Location: [facility];  Service: Orthopedics;  Laterality: Left;   FLEXIBLE SIGMOIDOSCOPY N/A 05/19/2016   Procedure: FLEXIBLE SIGMOIDOSCOPY;  Surgeon: [name], MD;  Location:  [facility];  Service: Endoscopy;  Laterality: N/A;   FLEXIBLE SIGMOIDOSCOPY N/A 06/28/2016   Procedure: FLEXIBLE SIGMOIDOSCOPY;  Surgeon: Ladene Artist, MD;  Location: [facility];  Service: Endoscopy;  Laterality: N/A;   LAPAROSCOPIC SIGMOID COLECTOMY N/A 06/30/2016   Procedure: LAPAROSCOPIC SIGMOID COLECTOMY,rigid sigmoidoscopy, repair recurrent incisional hernia;  Surgeon: Michael Boston, MD;  Location: [facility];  Service: General;  Laterality: N/A;   LAPAROTOMY N/A 04/29/2014   Procedure: EXPLORATORY LAPAROTOMY;  Surgeon: Imogene Burn. Georgette Dover, MD;  Location: [place];  Service: General;  Laterality: N/A;   LAPAROTOMY N/A 04/16/2018   Procedure: EXPLORATORY LAPAROTOMY WITH LYSIS OF ADHESIONS;  Surgeon: Armandina Gemma, MD;  Location: [facility];  Service: General;  Laterality: N/A;   LYSIS OF ADHESION N/A 04/29/2014   Procedure: EXTENSIVE LYSIS OF ADHESIONS;  Surgeon: Imogene Burn. Georgette Dover, MD;  Location: [place];  Service: General;  Laterality: N/A;   PERIPHERAL VASCULAR INTERVENTION  06/17/2020   Procedure: PERIPHERAL VASCULAR INTERVENTION;  Surgeon: Waynetta Sandy, MD;  Location: [place] CV LAB;  Service: Cardiovascular;;  right peroneal   VENTRAL HERNIA REPAIR N/A 04/29/2014   Procedure: PRIMARY REPAIR OF VENTRAL HERNIA;  Surgeon: Imogene Burn. Georgette Dover, MD;  Location: [place] OR;  Service: General;  Laterality: N/A;     OB History   No obstetric history on file.     Family History  Problem Relation Age of Onset   Hypertension Mother    Stroke Mother    Arthritis Mother    Alcohol abuse Father     Social History   Tobacco Use   Smoking status: Former    Pack years: 0.00    Types: Cigarettes    Quit date: 02/18/1990    Years since quitting: 31.2   Smokeless tobacco: Former    Types: Snuff    Quit date: 02/18/1990  Vaping Use   Vaping Use: Never used  Substance Use Topics   Alcohol use: No    Alcohol/week: 0.0 standard drinks    Comment: Only drank alcohol between ages of 32 and 78 yo.  Her  father gave it to her   Drug use: No    Home Medications Prior to Admission medications   Medication Sig Start Date End Date Taking? Authorizing Provider  bisacodyl (DULCOLAX) 5 MG EC tablet Take 5 mg by mouth daily as needed for moderate constipation.     [provider]  cetirizine (ZYRTEC) 10 MG tablet 1 tab by mouth once daily with evening meal for allergies 07/29/20   Mack Hook, MD  clopidogrel (PLAVIX) 75 MG tablet Take 1 tablet (75 mg total) by mouth daily with breakfast. 07/29/20   Mack Hook, MD  dorzolamide-timolol (COSOPT) 22.3-6.8 MG/ML ophthalmic solution Place 1 drop into both eyes 2 (two) times daily. 07/29/20   Mack Hook, MD  furosemide (LASIX) 40 MG tablet 1 tab by mouth daily with morning meal 07/29/20   Mack Hook, MD  latanoprost (XALATAN) 0.005 % ophthalmic solution Place 1 drop into both eyes at bedtime. 07/29/20   Mack Hook, MD  lisinopril (ZESTRIL) 10 MG tablet 1 tab by mouth daily with morning meal 07/29/20  Mack Hook, MD  pantoprazole (PROTONIX) 40 MG tablet 1 tab by mouth 1/2 hour before breakfast on empty stomach. 07/29/20   Mack Hook, MD  polyethylene glycol (MIRALAX / GLYCOLAX) 17 g packet Mix 17 g with water and drink daily with evening meal 07/29/20   Mack Hook, MD  potassium chloride (KLOR-CON) 10 MEQ tablet 1 tab by mouth daily with morning meal 07/29/20   Mack Hook, MD  rosuvastatin (CRESTOR) 10 MG tablet 1 tab by mouth daily with evening meal 07/29/20   Mack Hook, MD  senna-docusate (SENOKOT-S) 8.6-50 MG tablet Take 1 tablet by mouth at bedtime as needed for mild constipation. 03/31/20   Damita Lack, MD    Allergies    Morphine and related  Review of Systems   Review of Systems  All other systems reviewed and are negative.  Physical Exam Updated Vital Signs BP (!) 160/57   Pulse (!) 54   Temp 97.6 F (36.4 C) (Oral)   Resp 14   SpO2 100%    Physical Exam Vitals and nursing note reviewed.  Constitutional:      General: She is not in acute distress.    Appearance: She is well-developed. She is not ill-appearing, toxic-appearing or diaphoretic.     Comments: Elderly, frail  HENT:     Head: Normocephalic and atraumatic.     Right Ear: External ear normal.     Left Ear: External ear normal.  Eyes:     Conjunctiva/sclera: Conjunctivae normal.     Pupils: Pupils are equal, round, and reactive to light.  Neck:     Trachea: Phonation normal.  Cardiovascular:     Rate and Rhythm: Normal rate and regular rhythm.     Heart sounds: Normal heart sounds.  Pulmonary:     Effort: Pulmonary effort is normal.     Breath sounds: Normal breath sounds.  Chest:     Chest wall: No tenderness.  Abdominal:     General: There is no distension.     Palpations: Abdomen is soft.     Tenderness: There is no abdominal tenderness.  Musculoskeletal:        General: Normal range of motion.     Cervical back: Normal range of motion and neck supple.  Skin:    General: Skin is warm and dry.  Neurological:     Mental Status: She is alert and oriented to person, place, and time.     Cranial Nerves: No cranial nerve deficit.     Sensory: No sensory deficit.     Motor: No abnormal muscle tone.     Coordination: Coordination normal.  Psychiatric:        Mood and Affect: Mood normal.        Behavior: Behavior normal.    ED Results / Procedures / Treatments   Labs (all labs ordered are listed, but only abnormal results are displayed) Labs Reviewed  BASIC METABOLIC PANEL - Abnormal; Notable for the following components:      Result Value   Sodium 131 (*)    Chloride 97 (*)    Glucose, Bld 144 (*)    BUN 24 (*)    Creatinine, Ser 1.86 (*)    GFR, Estimated 26 (*)    All other components within normal limits  CBC - Abnormal; Notable for the following components:   WBC 3.0 (*)    RBC 3.58 (*)    Hemoglobin 10.0 (*)    HCT 31.6 (*)  Platelets 114 (*)    All other components within normal limits  TROPONIN I (HIGH SENSITIVITY)  TROPONIN I (HIGH SENSITIVITY)    EKG EKG Interpretation  Date/Time:  Thursday May 08 2021 08:00:52 EDT Ventricular Rate:  85 PR Interval:  202 QRS Duration: 92 QT Interval:  346 QTC Calculation: 411 R Axis:   -57 Text Interpretation: Normal sinus rhythm Left axis deviation Septal infarct , age undetermined Abnormal ECG since last tracing no significant change Confirmed by Daleen Bo [phone]) on 05/08/2021 10:38:41 AM   EKG Interpretation  Date/Time:  Thursday May 08 2021 10:27:44 EDT Ventricular Rate:  53 PR Interval:  263 QRS Duration: 102 QT Interval:  444 QTC Calculation: 417 R Axis:   -61 Text Interpretation: Sinus rhythm Artifact LAD, consider left anterior fascicular block Anterior infarct, old since last tracing no significant change Confirmed by Daleen Bo [id number]) on 05/08/2021 10:41:39 AM          Radiology DG Chest 2 View  Result Date: 05/08/2021 CLINICAL DATA:  Chest pain. EXAM: CHEST - 2 VIEW COMPARISON:  Mar 29, 2021. FINDINGS: Stable cardiomediastinal silhouette. Hypoinflation of the lungs is noted. Stable elevated right hemidiaphragm is noted with mild right basilar subsegmental atelectasis. Increased left basilar opacity is noted concerning for atelectasis or possibly infiltrate. Bony thorax is unremarkable. IMPRESSION: Hypoinflation of the lungs. Stable elevated right hemidiaphragm with mild right basilar subsegmental atelectasis. Increased left basilar opacity is noted concerning for atelectasis or possibly infiltrate. Aortic Atherosclerosis (ICD10-I70.0). Electronically Signed   By: Marijo Conception M.D.   On: 05/08/2021 08:50    Procedures Procedures   Medications Ordered in ED Medications - No data to display  ED Course  I have reviewed the triage vital signs and the nursing notes.  Pertinent labs & imaging results that were available during my care  of the patient were reviewed by me and considered in my medical decision making (see chart for details).  Clinical Course as of 05/08/21 1316  Thu May 08, 2021  1252 I have communicated with Domenic Moras, from hospice, regarding patient's care.  She will circle back with the patient's family member regarding care and disposition. [EW]    Clinical Course User Index [EW] Daleen Bo, MD   MDM Rules/Calculators/A&P                           Patient Vitals for the past 24 hrs:  BP Temp Temp src Pulse Resp SpO2  05/08/21 1200 (!) 160/57 -- -- -- 14 --  05/08/21 1100 (!) 141/65 -- -- -- 18 --  05/08/21 1024 (!) 150/68 -- -- (!) 54 14 100 %  05/08/21 0803 (!) 172/79 97.6 F (36.4 C) Oral 78 14 100 %    1:16 PM Reevaluation with update and discussion. After initial assessment and treatment, an updated evaluation reveals no change in clinical status.  Findings discussed and questions answered. Daleen Bo   Medical Decision Making:  This patient is presenting for evaluation of chest pain, which does require a range of treatment options, and is a complaint that involves a moderate risk of morbidity and mortality. The differential diagnoses include ACS, pneumonia, chest wall pain, upper abdominal process. I decided to review old records, and in summary [place] female, lives at home with family members presenting with nonspecific pain, has history of multiple medical problems, and is not being actively seen for cardiac disorders.  She last saw her PCP in February  2022. I received additional historical information from home health hospice services.  Clinical Laboratory Tests Ordered, included CBC, Metabolic panel, and troponin . Review indicates normal except white count low, hemoglobin low, platelets low, sodium low, chloride low, glucose high, BUN high, creatinine high, GFR low. Radiologic Tests Ordered, included chest x-ray.  I independently Visualized: Radiograph images, which show no  infiltrate or edema   Critical Interventions-clinical evaluation, laboratory testing, radiography, observation reassess  After These Interventions, the Patient was reevaluated and was found with reassuring findings.  She has history of pancytopenia, as well as renal insufficiency, with current findings consistent with those prior abnormalities.  She has intermittently received transfusions in the past for low hemoglobin.  She is apparently being managed and followed by her PCP.  CRITICAL CARE-no Performed by: Daleen Bo  Nursing Notes Reviewed/ Care Coordinated Applicable Imaging Reviewed Interpretation of Laboratory Data incorporated into ED treatment  The patient appears reasonably screened and/or stabilized for discharge and I doubt any other medical condition or other [facility] requiring further screening, evaluation, or treatment in the ED at this time prior to discharge.  Plan: Home Medications-continue routine medication; Home Treatments-regular diet and activity; return here if the recommended treatment, does not improve the symptoms; Recommended follow up-PCP and hospice care as needed.     Final Clinical Impression(s) / ED Diagnoses Final diagnoses:  Nonspecific chest pain  Pancytopenia ([place])    Rx / DC Orders ED Discharge Orders     None        Daleen Bo, MD 05/08/21 1317

## 2021-05-08 NOTE — ED Notes (Signed)
Assumed pt care at this time, drink offered to pt, pt able to tolerate fluids at this time, pt resting on bed comfortable, NAD distress noticed. Pt awaiting for [facility] for transportation home.

## 2021-05-08 NOTE — ED Notes (Signed)
Report given to [place], [place]

## 2021-05-08 NOTE — ED Notes (Signed)
Speaking with the daughter [name] for an update. She reports that she will contact [facility] (hospice) to let them know that her mother is here in the facility.  Update on current status given. She reports her mother is incontinent of urine and normally wears a brief. Her urine is malodorous.

## 2021-06-30 ENCOUNTER — Other Ambulatory Visit: Payer: Self-pay | Admitting: Internal Medicine

## 2021-07-29 ENCOUNTER — Other Ambulatory Visit: Payer: Self-pay | Admitting: Internal Medicine

## 2021-08-30 ENCOUNTER — Other Ambulatory Visit: Payer: Self-pay | Admitting: Internal Medicine

## 2021-09-24 ENCOUNTER — Encounter (HOSPITAL_COMMUNITY): Payer: Self-pay | Admitting: Emergency Medicine

## 2021-09-24 ENCOUNTER — Other Ambulatory Visit: Payer: Self-pay

## 2021-09-24 ENCOUNTER — Emergency Department (HOSPITAL_COMMUNITY)
Admission: EM | Admit: 2021-09-24 | Discharge: 2021-09-25 | Disposition: A | Discharge status: Home / Self Care | Attending: Emergency Medicine | Admitting: Emergency Medicine

## 2021-09-24 ENCOUNTER — Emergency Department (HOSPITAL_COMMUNITY)

## 2021-09-24 DIAGNOSIS — N1832 Chronic kidney disease, stage 3b: Secondary | ICD-10-CM | POA: Diagnosis not present

## 2021-09-24 DIAGNOSIS — N289 Disorder of kidney and ureter, unspecified: Secondary | ICD-10-CM | POA: Insufficient documentation

## 2021-09-24 DIAGNOSIS — I130 Hypertensive heart and chronic kidney disease with heart failure and stage 1 through stage 4 chronic kidney disease, or unspecified chronic kidney disease: Secondary | ICD-10-CM | POA: Insufficient documentation

## 2021-09-24 DIAGNOSIS — Z87891 Personal history of nicotine dependence: Secondary | ICD-10-CM | POA: Insufficient documentation

## 2021-09-24 DIAGNOSIS — R109 Unspecified abdominal pain: Secondary | ICD-10-CM | POA: Insufficient documentation

## 2021-09-24 DIAGNOSIS — Z79899 Other long term (current) drug therapy: Secondary | ICD-10-CM | POA: Insufficient documentation

## 2021-09-24 DIAGNOSIS — K219 Gastro-esophageal reflux disease without esophagitis: Secondary | ICD-10-CM | POA: Insufficient documentation

## 2021-09-24 DIAGNOSIS — R079 Chest pain, unspecified: Secondary | ICD-10-CM | POA: Insufficient documentation

## 2021-09-24 DIAGNOSIS — R748 Abnormal levels of other serum enzymes: Secondary | ICD-10-CM | POA: Diagnosis not present

## 2021-09-24 DIAGNOSIS — I509 Heart failure, unspecified: Secondary | ICD-10-CM | POA: Diagnosis not present

## 2021-09-24 DIAGNOSIS — R778 Other specified abnormalities of plasma proteins: Secondary | ICD-10-CM | POA: Insufficient documentation

## 2021-09-24 DIAGNOSIS — Z859 Personal history of malignant neoplasm, unspecified: Secondary | ICD-10-CM | POA: Diagnosis not present

## 2021-09-24 LAB — HEPATIC FUNCTION PANEL
ALT: 18 U/L (ref 0–44)
AST: 23 U/L (ref 15–41)
Albumin: 4 g/dL (ref 3.5–5.0)
Alkaline Phosphatase: 93 U/L (ref 38–126)
Bilirubin, Direct: <0.1 mg/dL (ref 0.0–0.2)
Total Bilirubin: 0.6 mg/dL (ref 0.3–1.2)
Total Protein: 7.8 g/dL (ref 6.5–8.1)

## 2021-09-24 LAB — CBC
HCT: 34.6 % — ABNORMAL LOW (ref 36.0–46.0)
Hemoglobin: 10.9 g/dL — ABNORMAL LOW (ref 12.0–15.0)
MCH: 28.5 pg (ref 26.0–34.0)
MCHC: 31.5 g/dL (ref 30.0–36.0)
MCV: 90.3 fL (ref 80.0–100.0)
Platelets: 184 10*3/uL (ref 150–400)
RBC: 3.83 MIL/uL — ABNORMAL LOW (ref 3.87–5.11)
RDW: 14 % (ref 11.5–15.5)
WBC: 3.6 10*3/uL — ABNORMAL LOW (ref 4.0–10.5)
nRBC: 0 % (ref 0.0–0.2)

## 2021-09-24 LAB — BASIC METABOLIC PANEL
Anion gap: 8 (ref 5–15)
BUN: 39 mg/dL — ABNORMAL HIGH (ref 8–23)
CO2: 21 mmol/L — ABNORMAL LOW (ref 22–32)
Calcium: 10 mg/dL (ref 8.9–10.3)
Chloride: 110 mmol/L (ref 98–111)
Creatinine, Ser: 1.94 mg/dL — ABNORMAL HIGH (ref 0.44–1.00)
GFR, Estimated: 24 mL/min — ABNORMAL LOW (ref >60)
Glucose, Bld: 110 mg/dL — ABNORMAL HIGH (ref 70–99)
Potassium: 4.4 mmol/L (ref 3.5–5.1)
Sodium: 139 mmol/L (ref 135–145)

## 2021-09-24 LAB — TROPONIN I (HIGH SENSITIVITY)
Troponin I (High Sensitivity): 40 ng/L — ABNORMAL HIGH (ref <18)
Troponin I (High Sensitivity): 40 ng/L — ABNORMAL HIGH (ref <18)

## 2021-09-24 LAB — LIPASE, BLOOD: Lipase: 70 U/L — ABNORMAL HIGH (ref 11–51)

## 2021-09-24 NOTE — ED Provider Notes (Signed)
Emergency Medicine Provider Triage Evaluation Note  Erika Day , a 87 y.o. female  was evaluated in triage.  Pt complains of chest pain and abd pain.  Of note, pts daughter states that she filled the pts rx for ativan recently and it is missing from her home. She is concerned someone may have taken the prescription  Review of Systems  Positive: Chest pain, abd pain Negative: fever  Physical Exam  BP 140/74 (BP Location: Right Arm)   Pulse 84   Temp 98.5 F (36.9 C) (Oral)   Resp 18   SpO2 100%  Gen:   Awake, no distress   Resp:  Normal effort  MSK:   Moves extremities without difficulty   Medical Decision Making  Medically screening exam initiated at 5:57 PM.  Appropriate orders placed.  Erika Day was informed that the remainder of the evaluation will be completed by another provider, this initial triage assessment does not replace that evaluation, and the importance of remaining in the ED until their evaluation is complete.     Bishop Dublin 09/24/21 1759    Regan Lemming, MD 09/24/21 2137

## 2021-09-24 NOTE — ED Triage Notes (Signed)
Per [facility] pt coming from home, is a hospice pt, c/o chest pain and abdominal pain x 36 hours. EMS spoke with patients daughter from [facility] who reports new ativan prescription picked up on Sunday and unable to find bottle today- daughter concerned someone in family has taken medication.

## 2021-09-24 NOTE — ED Provider Notes (Signed)
[facility] EMERGENCY DEPARTMENT Provider Note   CSN: [id number] Arrival date & time: 09/24/21  1720     History Chief Complaint  Patient presents with   Chest Pain    Erika Day is a 87 y.o. female.  The history is provided by the EMS personnel and a relative. The history is limited by the condition of the patient (Dementia).  Chest Pain She has history of hypertension, diabetes, hyperlipidemia, heart failure, chronic lymphocytic leukemia, stroke, peripheral artery disease and presented after complaining of chest pain at home.  She also apparently had been complaining of some abdominal pain.  She is not complaining of any pain at the time I am evaluating her.  She does not know why she is at the hospital.   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   Stroke [facility]) age 8 yo   poorly controlled DM and Hypertension   Volvulus of sigmoid colon ([place])     Patient Active Problem List   Diagnosis Date Noted   PAD (peripheral artery disease) ([place]) 06/17/2020   Gastroesophageal reflux disease without esophagitis 04/10/2020   Elevated d-dimer 03/27/2020   Chronic kidney disease, stage 3b ([place]) 03/27/2020   Uncontrolled type 2 diabetes mellitus with hyperglycemia ([place]) 03/27/2020   Pressure injury of skin 03/27/2020   Goals of care, counseling/discussion 01/23/2020   Recurrent falls 09/05/2019   CHF (congestive heart failure) ([place])  06/28/2019   MGUS (monoclonal gammopathy of unknown significance) 02/11/2018   History of diabetes mellitus 04/19/2017   Hypercalcemia 02/12/2017   Chronic leukopenia 02/12/2017   Postoperative anemia due to acute blood loss 11/26/2016   Fall 11/23/2016   Closed fracture of intertrochanteric section of femur ([place]) 11/23/2016   Pancytopenia ([place]) 11/13/2016   Sinusitis 10/14/2016   Bleeding external hemorrhoids 08/20/2016   Recurrent ventral incisional hernia s/p primary repair 06/30/2016 07/01/2016   Bilateral leg edema 05/20/2016   Protein-calorie malnutrition, severe ([place]) 05/20/2016   Sigmoid volvulus s/p lap sigmoid colectomy 06/30/2016 05/19/2016   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   Constipation 05/15/2016   Other fatigue 05/15/2016   Anemia 02/13/2016   Thrombocytopenia ([place]) 02/13/2016   Kyphosis 02/01/2016   Pressure ulcer of coccygeal region 01/31/2016   Mobility impaired 01/31/2016   Osteoarthritis 01/31/2016   Abdominal pain 04/23/2014   Essential hypertension 09/06/2011   Ovarian torsion 08/25/2011    Past Surgical History:  Procedure Laterality Date   ABDOMINAL AORTOGRAM W/LOWER EXTREMITY N/A 06/17/2020   Procedure: ABDOMINAL AORTOGRAM W/LOWER EXTREMITY;  Surgeon: Waynetta Sandy, MD;  Location: [facility];  Service: Cardiovascular;  Laterality: N/A;   ABDOMINAL HYSTERECTOMY  ? 08/25/2011 ?   Has BSO for left benign ovarian tumor with torsion and broad ligament benign tumor, but does not appear had hysterectomy--[facility], NOT ovarian cancer  CHOLECYSTECTOMY     FEMUR IM NAIL Left 11/23/2016   Procedure: INTRAMEDULLARY (IM) NAIL FEMORAL;  Surgeon: Gaynelle Arabian, MD;  Location: [facility];  Service: Orthopedics;  Laterality: Left;   FLEXIBLE SIGMOIDOSCOPY N/A 05/19/2016   Procedure: FLEXIBLE SIGMOIDOSCOPY;  Surgeon: [name], MD;  [facility];  Service:  Endoscopy;  Laterality: N/A;   FLEXIBLE SIGMOIDOSCOPY N/A 06/28/2016   Procedure: FLEXIBLE SIGMOIDOSCOPY;  Surgeon: Ladene Artist, MD;  [facility];  Service: Endoscopy;  Laterality: N/A;   LAPAROSCOPIC SIGMOID COLECTOMY N/A 06/30/2016   Procedure: LAPAROSCOPIC SIGMOID COLECTOMY,rigid sigmoidoscopy, repair recurrent incisional hernia;  Surgeon: Michael Boston, MD;  Location: [facility];  Service: General;  Laterality: N/A;   LAPAROTOMY N/A 04/29/2014   Procedure: EXPLORATORY LAPAROTOMY;  Surgeon: Imogene Burn. Georgette Dover, MD;  Location: [facility];  Service: General;  Laterality: N/A;   LAPAROTOMY N/A 04/16/2018   Procedure: EXPLORATORY LAPAROTOMY WITH LYSIS OF ADHESIONS;  Surgeon: Armandina Gemma, MD;  Location: [facility];  Service: General;  Laterality: N/A;   LYSIS OF ADHESION N/A 04/29/2014   Procedure: EXTENSIVE LYSIS OF ADHESIONS;  Surgeon: Imogene Burn. Georgette Dover, MD;  Location: [facility];  Service: General;  Laterality: N/A;   PERIPHERAL VASCULAR INTERVENTION  06/17/2020   Procedure: PERIPHERAL VASCULAR INTERVENTION;  Surgeon: Waynetta Sandy, MD;  Location: [facility];  Service: Cardiovascular;;  right peroneal   VENTRAL HERNIA REPAIR N/A 04/29/2014   Procedure: PRIMARY REPAIR OF VENTRAL HERNIA;  Surgeon: Imogene Burn. Georgette Dover, MD;  Location: [facility];  Service: General;  Laterality: N/A;     OB History   No obstetric history on file.     Family History  Problem Relation Age of Onset   Hypertension Mother    Stroke Mother    Arthritis Mother    Alcohol abuse Father     Social History   Tobacco Use   Smoking status: Former    Types: Cigarettes    Quit date: 02/18/1990    Years since quitting: 31.6   Smokeless tobacco: Former    Types: Snuff    Quit date: 02/18/1990  Vaping Use   Vaping Use: Never used  Substance Use Topics   Alcohol use: No    Alcohol/week: 0.0 standard drinks    Comment: Only drank alcohol between ages of 59 and 30 yo.  Her father gave it to her   Drug use: No     Home Medications Prior to Admission medications   Medication Sig Start Date End Date Taking? Authorizing Provider  bisacodyl (DULCOLAX) 5 MG EC tablet Take 5 mg by mouth daily as needed for moderate constipation.     [provider]  cetirizine (ZYRTEC) 10 MG tablet TAKE 1 TABLET BY MOUTH EVERY DAY WITH evening meal for allergies 07/02/21   Mack Hook, MD  clopidogrel (PLAVIX) 75 MG tablet TAKE 1 TABLET BY MOUTH EVERY DAY WITH breakfast 07/02/21   Mack Hook, MD  dorzolamide-timolol (COSOPT) 22.3-6.8 MG/ML ophthalmic solution instill 1 drop into BOTH eyes 2 TIMES DAILY 09/16/21   Mack Hook, MD  famotidine (PEPCID) 20 MG tablet TAKE 1 TABLET BY MOUTH 2 TIMES DAILY 07/31/21   Mack Hook, MD  furosemide (LASIX) 40 MG tablet TAKE 1 TABLET BY MOUTH EVERY DAY WITH morning meal 07/02/21   Mack Hook, MD  latanoprost (XALATAN) 0.005 % ophthalmic solution instill 1 drop IN [facility] EYE AT BEDTIME 07/02/21   Mack Hook, MD  lisinopril (ZESTRIL) 10 MG tablet TAKE 1 TABLET BY MOUTH EVERY  DAY WITH morning meal 07/02/21   Mack Hook, MD  pantoprazole (PROTONIX) 40 MG tablet TAKE 1 TABLET BY MOUTH EVERY DAY 1/2 hour BEFORE breakfast ON EMPTY stomach 07/02/21   Mack Hook, MD  polyethylene glycol (HEALTHYLAX) 17 g packet Mix 17 g with water and drink daily with evening meal 07/02/21   Mack Hook, MD  potassium chloride (KLOR-CON) 10 MEQ tablet TAKE 1 TABLET BY MOUTH EVERY DAY WITH morning meal 07/02/21   Mack Hook, MD  rosuvastatin (CRESTOR) 10 MG tablet TAKE 1 TABLET BY MOUTH EVERY DAY WITH evening meal 07/02/21   Mack Hook, MD  senna-docusate (SENOKOT-S) 8.6-50 MG tablet Take 1 tablet by mouth at bedtime as needed for mild constipation. 03/31/20   Damita Lack, MD    Allergies    Morphine and related  Review of Systems   Review of Systems  Unable to perform ROS: Dementia  Cardiovascular:  Positive  for chest pain.   Physical Exam Updated Vital Signs BP (!) 146/81   Pulse (!) 101   Temp 98.4 F (36.9 C)   Resp 19   SpO2 98%   Physical Exam Vitals and nursing note reviewed.  87 year old female, resting comfortably and in no acute distress. Vital signs are significant for mildly elevated blood pressure and borderline elevated heart rate. Oxygen saturation is 98%, which is normal. Head is normocephalic and atraumatic. PERRLA, EOMI. Oropharynx is clear. Neck is nontender and supple without adenopathy or JVD. Back is nontender and there is no CVA tenderness. Lungs are clear without rales, wheezes, or rhonchi. Chest is nontender. Heart has regular rate and rhythm without murmur. Abdomen is soft, flat, nontender without masses or hepatosplenomegaly and peristalsis is normoactive. Extremities have trace edema, full range of motion is present. Skin is warm and dry without rash. Neurologic: She is awake and oriented to person but not place or time, cranial nerves are intact, moves all extremities equally.  ED Results / Procedures / Treatments   Labs (all labs ordered are listed, but only abnormal results are displayed) Labs Reviewed  BASIC METABOLIC PANEL - Abnormal; Notable for the following components:      Result Value   CO2 21 (*)    Glucose, Bld 110 (*)    BUN 39 (*)    Creatinine, Ser 1.94 (*)    GFR, Estimated 24 (*)    All other components within normal limits  CBC - Abnormal; Notable for the following components:   WBC 3.6 (*)    RBC 3.83 (*)    Hemoglobin 10.9 (*)    HCT 34.6 (*)    All other components within normal limits  LIPASE, BLOOD - Abnormal; Notable for the following components:   Lipase 70 (*)    All other components within normal limits  TROPONIN I (HIGH SENSITIVITY) - Abnormal; Notable for the following components:   Troponin I (High Sensitivity) 40 (*)    All other components within normal limits  TROPONIN I (HIGH SENSITIVITY) - Abnormal; Notable for  the following components:   Troponin I (High Sensitivity) 40 (*)    All other components within normal limits  HEPATIC FUNCTION PANEL    EKG EKG Interpretation  Date/Time:  Wednesday September 24 2021 17:54:33 EST Ventricular Rate:  66 PR Interval:  182 QRS Duration: 84 QT Interval:  388 QTC Calculation: 406 R Axis:   -62 Text Interpretation: Sinus rhythm with Premature atrial complexes Left axis deviation Anteroseptal infarct , age undetermined Abnormal ECG When  compared with ECG of 05/08/2021, Premature atrial complexes are now present Confirmed by Delora Fuel ([id number]) on 09/24/2021 11:23:19 PM  Radiology DG Chest 1 View  Result Date: 09/24/2021 CLINICAL DATA:  Chest and abdominal pain. EXAM: CHEST  1 VIEW COMPARISON:  Chest x-ray 05/08/2021, CT abdomen pelvis 01/27/2021, CT angiography chest 03/26/2020 FINDINGS: The heart and mediastinal contours are unchanged. Aortic calcification. Elevated right hemidiaphragm. Bibasilar atelectasis. No focal consolidation. Persistent coarsened retrocardiac interstitial markings. No pulmonary edema. No pleural effusion. No pneumothorax. No acute osseous abnormality.  Chilaiditi syndrome. IMPRESSION: Persistent coarsened retrocardiac interstitial markings. Superimposed infection/inflammation not excluded. Electronically Signed   By: Iven Finn M.D.   On: 09/24/2021 18:46    Procedures Procedures   Medications Ordered in ED Medications - No data to display  ED Course  I have reviewed the triage vital signs and the nursing notes.  Pertinent labs & imaging results that were available during my care of the patient were reviewed by me and considered in my medical decision making (see chart for details).   MDM Rules/Calculators/A&P                         Report of chest pain, currently no complaints of chest pain.  ECG shows no acute ST or T changes.  Troponin is noted to be mildly elevated, but repeat troponin is unchanged.  It is unclear what  this represents.  Lipase is also noted to be mildly elevated, not significantly different from prior.  I had talked with her daughter who is medical power of attorney who is hopeful that patient could be admitted to the hospital for observation.  She is scheduled to enter a long-term care facility on 11/25.  Old records are reviewed, and she does have prior hospitalizations for chest pain, but I do not find any record of cardiac catheterization.  Case is discussed with Dr. Posey Pronto of [facility] hospitalist.  Given patient's lack of ongoing pain and stable troponins, no indication for hospital admission.  She is discharged back to home.  Final Clinical Impression(s) / ED Diagnoses Final diagnoses:  Nonspecific chest pain  Elevated troponin  Elevated lipase  Renal insufficiency    Rx / DC Orders ED Discharge Orders     None        Delora Fuel, MD 83/[birth year]/49 [phone]

## 2021-09-25 NOTE — ED Notes (Signed)
Called [facility] for pt

## 2021-09-25 NOTE — ED Notes (Signed)
Transport here and report given. Pts daughter called to notify

## 2021-09-25 NOTE — Discharge Instructions (Signed)
Return if you are having any problems. 

## 2021-09-25 NOTE — ED Notes (Signed)
Pt ready for dischsarge. [facility] notified of need for transport. Pts daughter called with update and notified that pt will be returning home once transport is set up. Pt resting quietly and is in NAD

## 2021-10-01 ENCOUNTER — Encounter (HOSPITAL_COMMUNITY): Payer: Self-pay | Admitting: Emergency Medicine

## 2021-10-01 ENCOUNTER — Emergency Department (HOSPITAL_COMMUNITY)
Admission: EM | Admit: 2021-10-01 | Discharge: 2021-10-02 | Disposition: A | Discharge status: Home / Self Care | Attending: Emergency Medicine | Admitting: Emergency Medicine

## 2021-10-01 ENCOUNTER — Other Ambulatory Visit: Payer: Self-pay

## 2021-10-01 DIAGNOSIS — Z85828 Personal history of other malignant neoplasm of skin: Secondary | ICD-10-CM | POA: Insufficient documentation

## 2021-10-01 DIAGNOSIS — N1832 Chronic kidney disease, stage 3b: Secondary | ICD-10-CM | POA: Insufficient documentation

## 2021-10-01 DIAGNOSIS — I509 Heart failure, unspecified: Secondary | ICD-10-CM | POA: Diagnosis not present

## 2021-10-01 DIAGNOSIS — W06XXXA Fall from bed, initial encounter: Secondary | ICD-10-CM | POA: Diagnosis not present

## 2021-10-01 DIAGNOSIS — R531 Weakness: Secondary | ICD-10-CM | POA: Diagnosis present

## 2021-10-01 DIAGNOSIS — F0390 Unspecified dementia without behavioral disturbance: Secondary | ICD-10-CM | POA: Diagnosis not present

## 2021-10-01 DIAGNOSIS — Z7902 Long term (current) use of antithrombotics/antiplatelets: Secondary | ICD-10-CM | POA: Insufficient documentation

## 2021-10-01 DIAGNOSIS — Z79899 Other long term (current) drug therapy: Secondary | ICD-10-CM | POA: Diagnosis not present

## 2021-10-01 DIAGNOSIS — I130 Hypertensive heart and chronic kidney disease with heart failure and stage 1 through stage 4 chronic kidney disease, or unspecified chronic kidney disease: Secondary | ICD-10-CM | POA: Insufficient documentation

## 2021-10-01 DIAGNOSIS — N390 Urinary tract infection, site not specified: Secondary | ICD-10-CM | POA: Insufficient documentation

## 2021-10-01 DIAGNOSIS — Z87891 Personal history of nicotine dependence: Secondary | ICD-10-CM | POA: Diagnosis not present

## 2021-10-01 DIAGNOSIS — E1122 Type 2 diabetes mellitus with diabetic chronic kidney disease: Secondary | ICD-10-CM | POA: Insufficient documentation

## 2021-10-01 NOTE — ED Triage Notes (Signed)
Pt here via [facility] from home after "Fall", pt slid out of bed, bed is 76ft tall. Per EMS pt just keeps saying "I hurt and I dont feel well". Pt has dementia, alert to self only. Pt is a hospice pt.

## 2021-10-01 NOTE — ED Provider Notes (Addendum)
Emergency Medicine Provider Triage Evaluation Note  LASHIA NIESE , a 87 y.o. female  was evaluated in triage.  Pt complains of fall and pain all over. Pt states she does not know why she fell. H/o dementia. Reports pain all over, esp in the R hip.   Review of Systems  Positive: R hip pain   Physical Exam  BP (!) 164/100   Pulse (!) 119   Temp 98 F (36.7 C) (Oral)   Resp 20   SpO2 94%  Gen:   Awake, no distress   Resp:  Normal effort  MSK:   Moves extremities without difficulty. TTP of R hip. Bilat lower ext edema Other:  Mild diffuse ttp of the abd  Medical Decision Making  Medically screening exam initiated at 11:51 PM.  Appropriate orders placed.  Kiely Carren Rang was informed that the remainder of the evaluation will be completed by another provider, this initial triage assessment does not replace that evaluation, and the importance of remaining in the ED until their evaluation is complete.  Labs, ekg, Barrie Dunker, PA-C 10/01/21 2354    Franchot Heidelberg, PA-C 10/02/21 0017    Quintella Reichert, MD 10/02/21 [phone]

## 2021-10-02 DIAGNOSIS — N390 Urinary tract infection, site not specified: Secondary | ICD-10-CM | POA: Diagnosis not present

## 2021-10-02 LAB — URINALYSIS, ROUTINE W REFLEX MICROSCOPIC
Bilirubin Urine: NEGATIVE
Glucose, UA: NEGATIVE mg/dL
Hgb urine dipstick: NEGATIVE
Ketones, ur: NEGATIVE mg/dL
Leukocytes,Ua: NEGATIVE
Nitrite: POSITIVE — AB
Protein, ur: NEGATIVE mg/dL
Specific Gravity, Urine: 1.015 (ref 1.005–1.030)
pH: 6 (ref 5.0–8.0)

## 2021-10-02 LAB — COMPREHENSIVE METABOLIC PANEL
ALT: 23 U/L (ref 0–44)
AST: 26 U/L (ref 15–41)
Albumin: 3.8 g/dL (ref 3.5–5.0)
Alkaline Phosphatase: 86 U/L (ref 38–126)
Anion gap: 9 (ref 5–15)
BUN: 29 mg/dL — ABNORMAL HIGH (ref 8–23)
CO2: 20 mmol/L — ABNORMAL LOW (ref 22–32)
Calcium: 9.5 mg/dL (ref 8.9–10.3)
Chloride: 111 mmol/L (ref 98–111)
Creatinine, Ser: 1.9 mg/dL — ABNORMAL HIGH (ref 0.44–1.00)
GFR, Estimated: 25 mL/min — ABNORMAL LOW (ref >60)
Glucose, Bld: 141 mg/dL — ABNORMAL HIGH (ref 70–99)
Potassium: 4.2 mmol/L (ref 3.5–5.1)
Sodium: 140 mmol/L (ref 135–145)
Total Bilirubin: 0.6 mg/dL (ref 0.3–1.2)
Total Protein: 7.4 g/dL (ref 6.5–8.1)

## 2021-10-02 LAB — CBC WITH DIFFERENTIAL/PLATELET
Abs Immature Granulocytes: 0.02 10*3/uL (ref 0.00–0.07)
Basophils Absolute: 0 10*3/uL (ref 0.0–0.1)
Basophils Relative: 0 %
Eosinophils Absolute: 0.1 10*3/uL (ref 0.0–0.5)
Eosinophils Relative: 1 %
HCT: 33.7 % — ABNORMAL LOW (ref 36.0–46.0)
Hemoglobin: 10.5 g/dL — ABNORMAL LOW (ref 12.0–15.0)
Immature Granulocytes: 0 %
Lymphocytes Relative: 24 %
Lymphs Abs: 1.4 10*3/uL (ref 0.7–4.0)
MCH: 28.5 pg (ref 26.0–34.0)
MCHC: 31.2 g/dL (ref 30.0–36.0)
MCV: 91.6 fL (ref 80.0–100.0)
Monocytes Absolute: 0.3 10*3/uL (ref 0.1–1.0)
Monocytes Relative: 6 %
Neutro Abs: 4.1 10*3/uL (ref 1.7–7.7)
Neutrophils Relative %: 69 %
Platelets: 188 10*3/uL (ref 150–400)
RBC: 3.68 MIL/uL — ABNORMAL LOW (ref 3.87–5.11)
RDW: 13.9 % (ref 11.5–15.5)
WBC: 6 10*3/uL (ref 4.0–10.5)
nRBC: 0 % (ref 0.0–0.2)

## 2021-10-02 LAB — URINALYSIS, MICROSCOPIC (REFLEX)

## 2021-10-02 MED ORDER — CEPHALEXIN 500 MG PO CAPS: 500.0000 mg | ORAL_CAPSULE | Freq: Four times a day (QID) | ORAL | 0 refills | Status: DC

## 2021-10-02 MED ORDER — CEPHALEXIN 250 MG PO CAPS
500.0000 mg | ORAL_CAPSULE | Freq: Once | ORAL | Status: AC
  Administered 2021-10-02: 500 mg via ORAL
  Filled 2021-10-02: qty 2

## 2021-10-02 MED ORDER — CEPHALEXIN 500 MG PO CAPS: 500.0000 mg | ORAL_CAPSULE | Freq: Two times a day (BID) | ORAL | 0 refills | Status: AC

## 2021-10-02 NOTE — Discharge Instructions (Addendum)
Erika Day was diagnosed in the ER today with a urinary tract infection.  She was given a dose of Keflex, an antibiotic today.  She will need to take this antibiotic twice a day for the next 5 days for her urine infection.  I sent the prescription to the pharmacy.  She will complete her course of antibiotics on 10/07/21.  Our social worker spoke to Erika Day at [facility], who said that they would be ready to accept Erika Day tomorrow (Dec 2nd 2022) in the morning.  Please call their facility in the morning to confirm this.  Please give them a copy of these papers, or make them aware that she is being treated for urine infection and will need the prescribed Keflex antibiotic until 10/07/21.

## 2021-10-02 NOTE — ED Notes (Signed)
Patient brief is dry when checked

## 2021-10-02 NOTE — ED Provider Notes (Signed)
[facility] EMERGENCY DEPARTMENT Provider Note   CSN: [id number] Arrival date & time: 10/01/21  2225     History Chief Complaint  Patient presents with   Fall   Weakness    Erika Day is a 87 y.o. female with a history of advanced dementia, CLL, presenting from home with concern for generalized pain and a possible fall.  The patient cannot provide history due to dementia.  Supplemental history was provided by her daughter who is her emergency contact by phone.  The patient is at home on home hospice with [facility].  Her daughter reports that last night the patient was with her husband, and the patient reportedly slid out of bed onto the ground.  After that she was complaining of pain in her hips.  She was brought to the ER for evaluation.  The patient did have a prolonged wait in the waiting room, approximately 13 hours overnight.  During that time she refused x-rays multiple times, and had soiled herself in the waiting room.  She was changed and cleaned.  On my evaluation in the morning the patient is sleeping, will answer simple questions but then continues to fall back asleep.  She does not report any pain to me.  Her daughter reports that the patient does have a history of UTIs and wonders whether she may have a urinary infection.  The patient is nonambulatory.  Her daughter also states to me that the patient is pending placement at a long-term care at [facility].  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   Panic attacks    Pneumonia 02/2016   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   Stroke [facility]) age 11 yo   poorly controlled DM and Hypertension   Volvulus of sigmoid colon ([place])     Patient Active Problem List   Diagnosis Date Noted   PAD (peripheral artery disease) ([place]) 06/17/2020   Gastroesophageal reflux disease without esophagitis 04/10/2020   Elevated d-dimer 03/27/2020   Chronic kidney disease, stage 3b ([place]) 03/27/2020   Uncontrolled type 2 diabetes mellitus with hyperglycemia ([place]) 03/27/2020   Pressure injury of skin 03/27/2020   Goals of care, counseling/discussion 01/23/2020   Recurrent falls 09/05/2019   CHF (congestive heart failure) ([place]) 06/28/2019   MGUS (monoclonal gammopathy of unknown significance) 02/11/2018   History of diabetes mellitus 04/19/2017   Hypercalcemia 02/12/2017   Chronic leukopenia 02/12/2017   Postoperative anemia due to acute blood loss 11/26/2016   Fall 11/23/2016   Closed fracture of intertrochanteric section of femur ([place]) 11/23/2016   Pancytopenia ([place]) 11/13/2016   Sinusitis 10/14/2016   Bleeding external hemorrhoids 08/20/2016   Recurrent ventral incisional hernia s/p primary repair 06/30/2016 07/01/2016   Bilateral leg edema 05/20/2016   Protein-calorie malnutrition, severe ([place]) 05/20/2016   Sigmoid volvulus s/p lap sigmoid colectomy 06/30/2016 05/19/2016   Small B-cell lymphoma of lymph  nodes of axilla ([place]) 05/15/2016   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   Constipation 05/15/2016   Other fatigue 05/15/2016   Anemia 02/13/2016   Thrombocytopenia ([place]) 02/13/2016   Kyphosis 02/01/2016   Pressure ulcer of coccygeal region 01/31/2016   Mobility impaired 01/31/2016   Osteoarthritis 01/31/2016   Abdominal pain 04/23/2014   Essential hypertension 09/06/2011   Ovarian torsion 08/25/2011    Past Surgical History:  Procedure Laterality Date    ABDOMINAL AORTOGRAM W/LOWER EXTREMITY N/A 06/17/2020   Procedure: ABDOMINAL AORTOGRAM W/LOWER EXTREMITY;  Surgeon: Waynetta Sandy, MD;  Location: [place] [facility];  Service: Cardiovascular;  Laterality: N/A;   ABDOMINAL HYSTERECTOMY  ? 08/25/2011 ?   Has BSO for left benign ovarian tumor with torsion and broad ligament benign tumor, but does not appear had hysterectomy--[facility], NOT ovarian cancer   CHOLECYSTECTOMY     FEMUR IM NAIL Left 11/23/2016   Procedure: INTRAMEDULLARY (IM) NAIL FEMORAL;  Surgeon: Gaynelle Arabian, MD;  Location: [facility];  Service: Orthopedics;  Laterality: Left;   FLEXIBLE SIGMOIDOSCOPY N/A 05/19/2016   Procedure: FLEXIBLE SIGMOIDOSCOPY;  Surgeon: [name], MD;  Location: [facility];  Service: Endoscopy;  Laterality: N/A;   FLEXIBLE SIGMOIDOSCOPY N/A 06/28/2016   Procedure: FLEXIBLE SIGMOIDOSCOPY;  Surgeon: Ladene Artist, MD;  Location: [facility];  Service: Endoscopy;  Laterality: N/A;   LAPAROSCOPIC SIGMOID COLECTOMY N/A 06/30/2016   Procedure: LAPAROSCOPIC SIGMOID COLECTOMY,rigid sigmoidoscopy, repair recurrent incisional hernia;  Surgeon: Michael Boston, MD;  Location: [facility];  Service: General;  Laterality: N/A;   LAPAROTOMY N/A 04/29/2014   Procedure: EXPLORATORY LAPAROTOMY;  Surgeon: Imogene Burn. Georgette Dover, MD;  Location: [place];  Service: General;  Laterality: N/A;   LAPAROTOMY N/A 04/16/2018   Procedure: EXPLORATORY LAPAROTOMY WITH LYSIS OF ADHESIONS;  Surgeon: Armandina Gemma, MD;  Location: [facility];  Service: General;  Laterality: N/A;   LYSIS OF ADHESION N/A 04/29/2014   Procedure: EXTENSIVE LYSIS OF ADHESIONS;  Surgeon: Imogene Burn. Georgette Dover, MD;  Location: [place];  Service: General;  Laterality: N/A;   PERIPHERAL VASCULAR INTERVENTION  06/17/2020   Procedure: PERIPHERAL VASCULAR INTERVENTION;  Surgeon: Waynetta Sandy, MD;  Location: [place] [facility];  Service: Cardiovascular;;  right peroneal   VENTRAL HERNIA REPAIR N/A 04/29/2014   Procedure: PRIMARY  REPAIR OF VENTRAL HERNIA;  Surgeon: Imogene Burn. Georgette Dover, MD;  Location: [place] [facility];  Service: General;  Laterality: N/A;     OB History   No obstetric history on file.     Family History  Problem Relation Age of Onset   Hypertension Mother    Stroke Mother    Arthritis Mother    Alcohol abuse Father     Social History   Tobacco Use   Smoking status: Former    Types: Cigarettes    Quit date: 02/18/1990    Years since quitting: 31.6   Smokeless tobacco: Former    Types: Snuff    Quit date: 02/18/1990  Vaping Use   Vaping Use: Never used  Substance Use Topics   Alcohol use: No    Alcohol/week: 0.0 standard drinks    Comment: Only drank alcohol between ages of 32 and 41 yo.  Her father gave it to her   Drug use: No    Home Medications Prior to Admission medications   Medication Sig Start Date End Date Taking? Authorizing Provider  bisacodyl (DULCOLAX) 5 MG EC tablet Take 5 mg by mouth daily as needed for moderate constipation.  Yes [provider]  cephALEXin (KEFLEX) 500 MG capsule Take 1 capsule (500 mg total) by mouth 4 (four) times daily. 10/02/21  Yes Seng Fouts, Carola Rhine, MD  cephALEXin (KEFLEX) 500 MG capsule Take 1 capsule (500 mg total) by mouth 2 (two) times daily for 5 days. 10/02/21 10/07/21 Yes Miles Leyda, Carola Rhine, MD  cetirizine (ZYRTEC) 10 MG tablet TAKE 1 TABLET BY MOUTH EVERY DAY WITH evening meal for allergies Patient taking differently: Take 10 mg by mouth daily. 07/02/21  Yes Mack Hook, MD  clopidogrel (PLAVIX) 75 MG tablet TAKE 1 TABLET BY MOUTH EVERY DAY WITH breakfast Patient taking differently: Take 75 mg by mouth daily. 07/02/21  Yes Mack Hook, MD  dorzolamide-timolol (COSOPT) 22.3-6.8 MG/ML ophthalmic solution instill 1 drop into BOTH eyes 2 TIMES DAILY Patient taking differently: Place 1 drop into both eyes 2 (two) times daily. 09/16/21  Yes Mack Hook, MD  famotidine (PEPCID) 20 MG tablet TAKE 1 TABLET BY MOUTH 2 TIMES  DAILY Patient taking differently: Take 20 mg by mouth at bedtime. 07/31/21  Yes Mack Hook, MD  furosemide (LASIX) 40 MG tablet TAKE 1 TABLET BY MOUTH EVERY DAY WITH morning meal Patient taking differently: Take 20 mg by mouth daily. 07/02/21  Yes Mack Hook, MD  latanoprost (XALATAN) 0.005 % ophthalmic solution instill 1 drop IN [facility] EYE AT BEDTIME Patient taking differently: Place 1 drop into both eyes at bedtime. 07/02/21  Yes Mack Hook, MD  lisinopril (ZESTRIL) 10 MG tablet TAKE 1 TABLET BY MOUTH EVERY DAY WITH morning meal Patient taking differently: Take 10 mg by mouth daily. 07/02/21  Yes Mack Hook, MD  LORazepam (ATIVAN) 0.5 MG tablet Take 0.5 mg by mouth every 4 (four) hours as needed for agitation. 09/22/21  Yes [provider]  nitroGLYCERIN (NITROSTAT) 0.4 MG SL tablet Place 0.4 mg under the tongue every 5 (five) minutes as needed for chest pain. 09/26/21  Yes [provider]  pantoprazole (PROTONIX) 40 MG tablet TAKE 1 TABLET BY MOUTH EVERY DAY 1/2 hour BEFORE breakfast ON EMPTY stomach Patient taking differently: Take 40 mg by mouth daily. 07/02/21  Yes Mack Hook, MD  polyethylene glycol (HEALTHYLAX) 17 g packet Mix 17 g with water and drink daily with evening meal Patient taking differently: Take 17 g by mouth daily. 07/02/21  Yes Mack Hook, MD  potassium chloride (KLOR-CON) 10 MEQ tablet TAKE 1 TABLET BY MOUTH EVERY DAY WITH morning meal Patient taking differently: Take 10 mEq by mouth daily. 07/02/21  Yes Mack Hook, MD  rosuvastatin (CRESTOR) 10 MG tablet TAKE 1 TABLET BY MOUTH EVERY DAY WITH evening meal Patient taking differently: Take 10 mg by mouth daily. 07/02/21  Yes Mack Hook, MD  senna-docusate (SENOKOT-S) 8.6-50 MG tablet Take 1 tablet by mouth at bedtime as needed for mild constipation. 03/31/20  Yes Amin, [name], MD    Allergies    Morphine and related  Review of Systems    Review of Systems  Unable to perform ROS: Dementia (level 5 caveat)   Physical Exam Updated Vital Signs BP 120/77   Pulse 89   Temp 98.9 F (37.2 C) (Rectal)   Resp (!) 26   SpO2 94%   Physical Exam HENT:     Head: Normocephalic and atraumatic.  Eyes:     Conjunctiva/sclera: Conjunctivae normal.     Pupils: Pupils are equal, round, and reactive to light.  Cardiovascular:     Rate and Rhythm: Normal rate and regular rhythm.  Pulmonary:  Effort: Pulmonary effort is normal. No respiratory distress.  Musculoskeletal:     Comments: Passive Rom of the bilateral hips  Skin:    General: Skin is warm and dry.  Neurological:     General: No focal deficit present.     Mental Status: She is alert. Mental status is at baseline.    ED Results / Procedures / Treatments   Labs (all labs ordered are listed, but only abnormal results are displayed) Labs Reviewed  CBC WITH DIFFERENTIAL/PLATELET - Abnormal; Notable for the following components:      Result Value   RBC 3.68 (*)    Hemoglobin 10.5 (*)    HCT 33.7 (*)    All other components within normal limits  COMPREHENSIVE METABOLIC PANEL - Abnormal; Notable for the following components:   CO2 20 (*)    Glucose, Bld 141 (*)    BUN 29 (*)    Creatinine, Ser 1.90 (*)    GFR, Estimated 25 (*)    All other components within normal limits  URINALYSIS, ROUTINE W REFLEX MICROSCOPIC - Abnormal; Notable for the following components:   APPearance CLOUDY (*)    Nitrite POSITIVE (*)    All other components within normal limits  URINALYSIS, MICROSCOPIC (REFLEX) - Abnormal; Notable for the following components:   Bacteria, UA MANY (*)    All other components within normal limits  URINE CULTURE    EKG None  Radiology No results found.  Procedures Procedures   Medications Ordered in ED Medications  cephALEXin (KEFLEX) capsule 500 mg (has no administration in time range)    ED Course  I have reviewed the triage vital signs  and the nursing notes.  Pertinent labs & imaging results that were available during my care of the patient were reviewed by me and considered in my medical decision making (see chart for details).  Patient with advanced dementia presenting with possible low impact fall, sliding from bed.  She has full range of motion at her hips on my exam, with fairly minimal tenderness, and I have a low suspicion clinically for fracture.  She had refused x-rays.  She does appear to have fairly advanced dementia, and is not pleased to be in the hospital.  I did obtain supplemental history from her daughter on the phone and discussed plans for additional medical work-up, given that the patient has home hospice, I recommended limiting aggressive medical care.  This seems to be in keeping with the family's wishes as well.  I reviewed the basic bloodtests which are largely unremarkable, Cr 1.9.  We will attempt to perform In-N-Out catheter for urine sample.    Clinical Course as of 10/02/21 1438  Thu Oct 02, 2021  1406 Patient's urine is cloudy and positive for nitrites.  This could be consistent with a UTI.  Her last urine culture from 2019 was pansensitive.  We will start her on Keflex and send for a urine culture today. [MT]  15 SW contacted [facility] - see separate note -their team has a bed available for the patient.  They cannot accept her today but can accept her tomorrow.  Based on this have spoken to the patient's daughter again by phone, and confirmed that we will be sending her home by [facility], and the family will take her for admission to [facility] tomorrow.  Her daughter verbalized understanding and agreement the plan.  I also made clear that her mother likely has a UTI, that would begin treatment with Keflex today,  that this was prescribed to the pharmacy.  They will need to continue treatment either at home or at her facility for 5 days [MT]    Clinical Course User Index [MT] Verneta Hamidi, Carola Rhine, MD     Final Clinical Impression(s) / ED Diagnoses Final diagnoses:  Lower urinary tract infectious disease  Dementia, unspecified dementia severity, unspecified dementia type, unspecified whether behavioral, psychotic, or mood disturbance or anxiety ([place])    Rx / DC Orders ED Discharge Orders          Ordered    cephALEXin (KEFLEX) 500 MG capsule  4 times daily        10/02/21 1405    cephALEXin (KEFLEX) 500 MG capsule  2 times daily        10/02/21 1434             Wyvonnia Dusky, MD 10/02/21 1439

## 2021-10-02 NOTE — ED Notes (Signed)
Patient moved into a recliner and brief changed.

## 2021-10-02 NOTE — Progress Notes (Signed)
CSW spoke with Tressa Busman the admissions director at [facility] who confirmed that he has been working with the family and [facility]. Tressa Busman stated he has all the clinicals and information he needs for admission. Tressa Busman stated he is gone for the rest of the day and patient would not be able to come until tomorrow morning. Tressa Busman stated he did not know about patient being in the hospital again. Tressa Busman asked if patient could be held until tomorrow and they would only need assistance with transportation. CSW stated she is unsure if patient would be held and would speak with the provider. CSW also updated [facility] about the plan for patients care.

## 2021-10-02 NOTE — ED Notes (Addendum)
NT noticed that pt soiled herself. NT tried to help pt get changed and cleaned up but pt is refusing and is unable to stand to do so. Pt stated "I'm going home any ways" and won't let us clean her up. Pt seems very confused.

## 2021-10-02 NOTE — ED Notes (Signed)
Daughter [name]) called by MD and made aware of discharge instructions and ptar bringing home

## 2021-10-02 NOTE — ED Notes (Signed)
The patient is becoming restless and keeps trying to take her hospital gown off. She is calling out for family members that are not present at the time. The patient also tries to get up out of the recliner, this EMT actively redirects the patient.

## 2021-10-02 NOTE — ED Notes (Addendum)
NT called pts daughter, [name], and daughter stated that she has seemed more and more confused these days and less cooperative. Daughter states that pt takes lorazepam to help calm her down. NT let daughter speak to pt but pt was confused and not making any sense, and daughter stated that that is how she has been for awhile.

## 2021-10-02 NOTE — Progress Notes (Signed)
[facility]) Hospitalized Hospice Patient Visit   Erika Day is a current hospice patient with a terminal diagnosis of  Aortic Stenosis. Patient point of contact is: Chong Sicilian [phone]. Per RN, Lysbeth Galas, patient is arranged with [facility] to return home this PM and family will transport her to [facility] facility tomorrow morning.   ACC will continue to follow.   Daphene Calamity, MSW  [facility] Social Worker  [phone], [phone]

## 2021-10-02 NOTE — ED Notes (Signed)
Xray called this RN, pt is refusing xrays

## 2021-10-02 NOTE — ED Notes (Signed)
This EMT and Keith-NT performed pericare for the patient as well as changed the patient's brief and chux pad.

## 2021-10-02 NOTE — ED Notes (Signed)
[facility] called to transport pt

## 2021-10-03 ENCOUNTER — Emergency Department (HOSPITAL_COMMUNITY)

## 2021-10-03 ENCOUNTER — Emergency Department (HOSPITAL_COMMUNITY)
Admission: EM | Admit: 2021-10-03 | Discharge: 2021-10-04 | Disposition: A | Discharge status: Home / Self Care | Attending: Emergency Medicine | Admitting: Emergency Medicine

## 2021-10-03 ENCOUNTER — Other Ambulatory Visit: Payer: Self-pay

## 2021-10-03 DIAGNOSIS — E1122 Type 2 diabetes mellitus with diabetic chronic kidney disease: Secondary | ICD-10-CM | POA: Diagnosis not present

## 2021-10-03 DIAGNOSIS — Z859 Personal history of malignant neoplasm, unspecified: Secondary | ICD-10-CM | POA: Insufficient documentation

## 2021-10-03 DIAGNOSIS — Z7902 Long term (current) use of antithrombotics/antiplatelets: Secondary | ICD-10-CM | POA: Insufficient documentation

## 2021-10-03 DIAGNOSIS — I130 Hypertensive heart and chronic kidney disease with heart failure and stage 1 through stage 4 chronic kidney disease, or unspecified chronic kidney disease: Secondary | ICD-10-CM | POA: Insufficient documentation

## 2021-10-03 DIAGNOSIS — N1832 Chronic kidney disease, stage 3b: Secondary | ICD-10-CM | POA: Insufficient documentation

## 2021-10-03 DIAGNOSIS — Z79899 Other long term (current) drug therapy: Secondary | ICD-10-CM | POA: Insufficient documentation

## 2021-10-03 DIAGNOSIS — Z20822 Contact with and (suspected) exposure to covid-19: Secondary | ICD-10-CM | POA: Diagnosis not present

## 2021-10-03 DIAGNOSIS — I509 Heart failure, unspecified: Secondary | ICD-10-CM | POA: Diagnosis not present

## 2021-10-03 DIAGNOSIS — R531 Weakness: Secondary | ICD-10-CM | POA: Insufficient documentation

## 2021-10-03 DIAGNOSIS — R0902 Hypoxemia: Secondary | ICD-10-CM | POA: Diagnosis present

## 2021-10-03 DIAGNOSIS — Z87891 Personal history of nicotine dependence: Secondary | ICD-10-CM | POA: Diagnosis not present

## 2021-10-03 LAB — RESP PANEL BY RT-PCR (FLU A&B, COVID) ARPGX2
Influenza A by PCR: NEGATIVE
Influenza B by PCR: NEGATIVE
SARS Coronavirus 2 by RT PCR: NEGATIVE

## 2021-10-03 LAB — CBG MONITORING, ED: Glucose-Capillary: 96 mg/dL (ref 70–99)

## 2021-10-03 NOTE — Progress Notes (Signed)
[facility]) Hospitalized Hospice Patient Visit     Erika Day is a current hospice patient with a terminal diagnosis of  Aortic Stenosis. Patient point of contact is: Chong Sicilian [phone]. Plan is for patient to admit to [facility] facility earlier today prior to ER visit.   ACC will continue to follow.    Daphene Calamity, MSW  [facility] Social Worker  [phone], [phone]

## 2021-10-03 NOTE — ED Notes (Signed)
Ptar has left with the pt to go to [facility] the daughter called about this pt approx 15 minutes before ptar called

## 2021-10-03 NOTE — ED Notes (Signed)
PT has 9 people infront of her for [facility] [place]

## 2021-10-03 NOTE — ED Triage Notes (Signed)
Pt arrived by EMS from home concerned for worsening UTI. Pt was supposed to be going to living facilty but got brought here because EMS thought she was to unstable to go to facility. Pt has increased weakness of shortness of breath. EMS placed on 4L [place] for O2 sats in the 80%s   Pt is lethargic wakes up to voice is oriented and follows commands

## 2021-10-03 NOTE — ED Notes (Signed)
Report called to [facility]

## 2021-10-03 NOTE — ED Notes (Signed)
Pt still waiting for ptar to transport her to [facility]

## 2021-10-03 NOTE — ED Provider Notes (Signed)
[facility] EMERGENCY DEPARTMENT Provider Note   CSN: [id number] Arrival date & time: 10/03/21  1128     History Chief Complaint  Patient presents with   Weakness    Erika Day is a 87 y.o. female.  HPI  87 year old female who is currently hospice with a [facility] presents the emergency department with concern for hypoxia.  Patient is a poor historian.  Was seen yesterday for low impact fall, diagnosed with UTI, placed on Keflex.  Was sent home with the daughter last night the plan was to transfer the patient to facility for admission today.  When EMS was in route they noticed that the patient became hypoxic and diverted here for evaluation.  Unclear if the patient requires oxygen.  No family or staff member at bedside.  Patient denies any complaints to me.  Unclear if she is been compliant with her Keflex.  Attempted to reach the daughter and first attempt was unsuccessful.  Level 5 caveat due to patient's mental status.    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   Stroke [facility]) age 10 yo   poorly controlled DM and Hypertension   Volvulus of sigmoid colon ([place])     Patient Active Problem List   Diagnosis Date Noted   PAD (peripheral artery disease) ([place]) 06/17/2020   Gastroesophageal reflux disease without esophagitis 04/10/2020   Elevated d-dimer 03/27/2020   Chronic kidney disease, stage 3b ([place]) 03/27/2020    Uncontrolled type 2 diabetes mellitus with hyperglycemia ([place]) 03/27/2020   Pressure injury of skin 03/27/2020   Goals of care, counseling/discussion 01/23/2020   Recurrent falls 09/05/2019   CHF (congestive heart failure) ([place]) 06/28/2019   MGUS (monoclonal gammopathy of unknown significance) 02/11/2018   History of diabetes mellitus 04/19/2017   Hypercalcemia 02/12/2017   Chronic leukopenia 02/12/2017   Postoperative anemia due to acute blood loss 11/26/2016   Fall 11/23/2016   Closed fracture of intertrochanteric section of femur ([place]) 11/23/2016   Pancytopenia ([place]) 11/13/2016   Sinusitis 10/14/2016   Bleeding external hemorrhoids 08/20/2016   Recurrent ventral incisional hernia s/p primary repair 06/30/2016 07/01/2016   Bilateral leg edema 05/20/2016   Protein-calorie malnutrition, severe ([place]) 05/20/2016   Sigmoid volvulus s/p lap sigmoid colectomy 06/30/2016 05/19/2016   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   Constipation 05/15/2016   Other fatigue 05/15/2016   Anemia 02/13/2016   Thrombocytopenia ([place]) 02/13/2016   Kyphosis 02/01/2016   Pressure ulcer of coccygeal region 01/31/2016   Mobility impaired 01/31/2016   Osteoarthritis 01/31/2016   Abdominal pain 04/23/2014   Essential hypertension 09/06/2011   Ovarian torsion 08/25/2011    Past Surgical History:  Procedure Laterality Date   ABDOMINAL AORTOGRAM W/LOWER EXTREMITY N/A 06/17/2020   Procedure: ABDOMINAL AORTOGRAM W/LOWER EXTREMITY;  Surgeon: Waynetta Sandy, MD;  Location:  [facility] INVASIVE CV LAB;  Service: Cardiovascular;  Laterality: N/A;   ABDOMINAL HYSTERECTOMY  ? 08/25/2011 ?   Has BSO for left benign ovarian tumor with torsion and broad ligament benign tumor, but does not appear had hysterectomy--[facility], NOT ovarian cancer   CHOLECYSTECTOMY     FEMUR IM NAIL Left 11/23/2016   Procedure: INTRAMEDULLARY (IM) NAIL FEMORAL;  Surgeon:  Gaynelle Arabian, MD;  Location: [facility];  Service: Orthopedics;  Laterality: Left;   FLEXIBLE SIGMOIDOSCOPY N/A 05/19/2016   Procedure: FLEXIBLE SIGMOIDOSCOPY;  Surgeon: [name], MD;  Location: [facility];  Service: Endoscopy;  Laterality: N/A;   FLEXIBLE SIGMOIDOSCOPY N/A 06/28/2016   Procedure: FLEXIBLE SIGMOIDOSCOPY;  Surgeon: Ladene Artist, MD;  Location: [facility];  Service: Endoscopy;  Laterality: N/A;   LAPAROSCOPIC SIGMOID COLECTOMY N/A 06/30/2016   Procedure: LAPAROSCOPIC SIGMOID COLECTOMY,rigid sigmoidoscopy, repair recurrent incisional hernia;  Surgeon: Michael Boston, MD;  Location: [facility];  Service: General;  Laterality: N/A;   LAPAROTOMY N/A 04/29/2014   Procedure: EXPLORATORY LAPAROTOMY;  Surgeon: Imogene Burn. Georgette Dover, MD;  Location: [facility];  Service: General;  Laterality: N/A;   LAPAROTOMY N/A 04/16/2018   Procedure: EXPLORATORY LAPAROTOMY WITH LYSIS OF ADHESIONS;  Surgeon: Armandina Gemma, MD;  Location: [facility];  Service: General;  Laterality: N/A;   LYSIS OF ADHESION N/A 04/29/2014   Procedure: EXTENSIVE LYSIS OF ADHESIONS;  Surgeon: Imogene Burn. Georgette Dover, MD;  Location: [facility];  Service: General;  Laterality: N/A;   PERIPHERAL VASCULAR INTERVENTION  06/17/2020   Procedure: PERIPHERAL VASCULAR INTERVENTION;  Surgeon: Waynetta Sandy, MD;  Location: [facility] CV LAB;  Service: Cardiovascular;;  right peroneal   VENTRAL HERNIA REPAIR N/A 04/29/2014   Procedure: PRIMARY REPAIR OF VENTRAL HERNIA;  Surgeon: Imogene Burn. Georgette Dover, MD;  Location: [facility] OR;  Service: General;  Laterality: N/A;     OB History   No obstetric history on file.     Family History  Problem Relation Age of Onset   Hypertension Mother    Stroke Mother    Arthritis Mother    Alcohol abuse Father     Social History   Tobacco Use   Smoking status: Former    Types: Cigarettes    Quit date: 02/18/1990    Years since quitting: 31.6   Smokeless tobacco: Former    Types: Snuff    Quit date: 02/18/1990   Vaping Use   Vaping Use: Never used  Substance Use Topics   Alcohol use: No    Alcohol/week: 0.0 standard drinks    Comment: Only drank alcohol between ages of 59 and 35 yo.  Her father gave it to her   Drug use: No    Home Medications Prior to Admission medications   Medication Sig Start Date End Date Taking? Authorizing Provider  bisacodyl (DULCOLAX) 5 MG EC tablet Take 5 mg by mouth daily as needed for moderate constipation.     [provider]  cephALEXin (KEFLEX) 500 MG capsule Take 1 capsule (500 mg total) by mouth 2 (two) times daily for 5 days. 10/02/21 10/07/21  Wyvonnia Dusky, MD  cetirizine (ZYRTEC) 10 MG tablet TAKE 1 TABLET BY MOUTH EVERY DAY WITH evening meal for allergies Patient taking differently: Take 10 mg by mouth daily. 07/02/21   Mack Hook, MD  clopidogrel (PLAVIX) 75 MG tablet TAKE 1 TABLET BY MOUTH EVERY DAY WITH breakfast Patient taking differently: Take 75 mg by mouth daily. 07/02/21   Mack Hook, MD  dorzolamide-timolol (COSOPT) 22.3-6.8 MG/ML ophthalmic  solution instill 1 drop into BOTH eyes 2 TIMES DAILY Patient taking differently: Place 1 drop into both eyes 2 (two) times daily. 09/16/21   Mack Hook, MD  famotidine (PEPCID) 20 MG tablet TAKE 1 TABLET BY MOUTH 2 TIMES DAILY Patient taking differently: Take 20 mg by mouth at bedtime. 07/31/21   Mack Hook, MD  furosemide (LASIX) 40 MG tablet TAKE 1 TABLET BY MOUTH EVERY DAY WITH morning meal Patient taking differently: Take 20 mg by mouth daily. 07/02/21   Mack Hook, MD  latanoprost (XALATAN) 0.005 % ophthalmic solution instill 1 drop IN [facility] EYE AT BEDTIME Patient taking differently: Place 1 drop into both eyes at bedtime. 07/02/21   Mack Hook, MD  lisinopril (ZESTRIL) 10 MG tablet TAKE 1 TABLET BY MOUTH EVERY DAY WITH morning meal Patient taking differently: Take 10 mg by mouth daily. 07/02/21   Mack Hook, MD  LORazepam (ATIVAN) 0.5 MG  tablet Take 0.5 mg by mouth every 4 (four) hours as needed for agitation. 09/22/21   [provider]  nitroGLYCERIN (NITROSTAT) 0.4 MG SL tablet Place 0.4 mg under the tongue every 5 (five) minutes as needed for chest pain. 09/26/21   [provider]  pantoprazole (PROTONIX) 40 MG tablet TAKE 1 TABLET BY MOUTH EVERY DAY 1/2 hour BEFORE breakfast ON EMPTY stomach Patient taking differently: Take 40 mg by mouth daily. 07/02/21   Mack Hook, MD  polyethylene glycol (HEALTHYLAX) 17 g packet Mix 17 g with water and drink daily with evening meal Patient taking differently: Take 17 g by mouth daily. 07/02/21   Mack Hook, MD  potassium chloride (KLOR-CON) 10 MEQ tablet TAKE 1 TABLET BY MOUTH EVERY DAY WITH morning meal Patient taking differently: Take 10 mEq by mouth daily. 07/02/21   Mack Hook, MD  rosuvastatin (CRESTOR) 10 MG tablet TAKE 1 TABLET BY MOUTH EVERY DAY WITH evening meal Patient taking differently: Take 10 mg by mouth daily. 07/02/21   Mack Hook, MD  senna-docusate (SENOKOT-S) 8.6-50 MG tablet Take 1 tablet by mouth at bedtime as needed for mild constipation. 03/31/20   Damita Lack, MD    Allergies    Morphine and related  Review of Systems   Review of Systems  Unable to perform ROS: Mental status change   Physical Exam Updated Vital Signs BP (!) 148/99   Pulse 90   Temp 100.3 F (37.9 C) (Axillary)   Resp (!) 23   SpO2 95%   Physical Exam Vitals and nursing note reviewed.  Constitutional:      General: She is not in acute distress.    Appearance: Normal appearance. She is not diaphoretic.  HENT:     Head: Normocephalic.     Mouth/Throat:     Mouth: Mucous membranes are moist.  Eyes:     Pupils: Pupils are equal, round, and reactive to light.  Cardiovascular:     Rate and Rhythm: Normal rate.  Pulmonary:     Effort: Pulmonary effort is normal. No respiratory distress.  Abdominal:     Palpations: Abdomen is  soft.  Musculoskeletal:        General: No swelling or deformity.  Skin:    General: Skin is warm.  Neurological:     General: No focal deficit present.     Mental Status: She is alert.  Psychiatric:        Mood and Affect: Mood normal.    ED Results / Procedures / Treatments   Labs (all labs ordered are  listed, but only abnormal results are displayed) Labs Reviewed  RESP PANEL BY RT-PCR (FLU A&B, COVID) [id number]  CBG MONITORING, ED    EKG EKG Interpretation  Date/Time:  Friday October 03 2021 11:48:07 EST Ventricular Rate:  100 PR Interval:  180 QRS Duration: 97 QT Interval:  339 QTC Calculation: 438 R Axis:   -46 Text Interpretation: Sinus tachycardia Left axis deviation Anterior infarct, old Borderline T abnormalities, inferior leads Similar to previous Confirmed by Lavenia Atlas [id number]) on 10/03/2021 1:35:17 PM  Radiology DG Chest Port 1 View  Result Date: 10/03/2021 CLINICAL DATA:  Shortness of breath, increased weakness. EXAM: PORTABLE CHEST 1 VIEW COMPARISON:  Chest radiograph dated September 24, 2021 FINDINGS: The heart is enlarged. Aorta is tortuous with atherosclerotic calcifications. Chronic elevation of the right hemidiaphragm with bibasilar atelectasis. No evidence of focal consolidation or pleural effusion. IMPRESSION: Stable cardiomegaly. Tortuous aorta with atherosclerotic calcifications. Chronic elevation of the right hemidiaphragm with bibasilar atelectasis, unchanged. No focal consolidation or pleural effusion. Electronically Signed   By: Keane Police D.O.   On: 10/03/2021 12:27    Procedures .Critical Care Performed by: Lorelle Gibbs, DO Authorized by: Lorelle Gibbs, DO   Critical care provider statement:    Critical care time (minutes):  30   Critical care was necessary to treat or prevent imminent or life-threatening deterioration of the following conditions:  Respiratory failure   Critical care was time spent personally by me on the following  activities:  Development of treatment plan with patient or surrogate, discussions with consultants, evaluation of patient's response to treatment, examination of patient, ordering and review of laboratory studies, ordering and review of radiographic studies, ordering and performing treatments and interventions, pulse oximetry, re-evaluation of patient's condition and review of old charts   I assumed direction of critical care for this patient from another provider in my specialty: no     Medications Ordered in ED Medications - No data to display  ED Course  I have reviewed the triage vital signs and the nursing notes.  Pertinent labs & imaging results that were available during my care of the patient were reviewed by me and considered in my medical decision making (see chart for details).    MDM Rules/Calculators/A&P                           87 year old female presents emergency department with concern for hypoxia found by EMS in route to admit the patient at a facility.  She is hospice, end-of-life care.  Unable to reach daughter on first attempt.  Patient denies any complaints.  Recent diagnosis with UTI yesterday, placed on antibiotics.  Unclear if there is been compliance with medication.  Patient was weaned down to 2 L nasal cannula, unclear if there is ever been oxygen requirement before.  Nurse from [facility] has arrived.  They state that she has required oxygen in the past.  Patient is end-of-life care, wishes would be for no escalated care.  Chest x-ray shows chronic changes, EKG is unchanged.  We are pending fingerstick and COVID/flu swab.  Will contact the daughter for further goals.  Fingerstick is appropriate, flu and COVID swab are negative.  Patient continues to require 2 L nasal cannula.  Social worker from [facility] is at bedside.  She is very familiar with the patient.  Daughter at bedside as well.  They do not want any escalation of care, this is expected end-of-life trend  for the patient.  They have contacted the facility that was supposed to accept the patient today and they are still able to accept the patient on the 2 L nasal cannula.  I feel this is appropriate care for the patient given her hospice status.  Vital stable at time of transfer.  Final Clinical Impression(s) / ED Diagnoses Final diagnoses:  None    Rx / DC Orders ED Discharge Orders     None        Lorelle Gibbs, DO 10/03/21 1524

## 2022-03-02 DEATH — deceased
